# Patient Record
Sex: Male | Born: 1980 | Race: Black or African American | Hispanic: No | Marital: Single | Smoking: Current every day smoker
Health system: Southern US, Community
[De-identification: ages and names within clinical notes are randomized; demographics above are authoritative.]

## PROBLEM LIST (undated history)

## (undated) DIAGNOSIS — N183 Chronic kidney disease, stage 3 unspecified: Secondary | ICD-10-CM

## (undated) DIAGNOSIS — I10 Essential (primary) hypertension: Secondary | ICD-10-CM

## (undated) DIAGNOSIS — I5043 Acute on chronic combined systolic (congestive) and diastolic (congestive) heart failure: Secondary | ICD-10-CM

## (undated) DIAGNOSIS — I5021 Acute systolic (congestive) heart failure: Secondary | ICD-10-CM

## (undated) DIAGNOSIS — I499 Cardiac arrhythmia, unspecified: Secondary | ICD-10-CM

## (undated) DIAGNOSIS — F1910 Other psychoactive substance abuse, uncomplicated: Secondary | ICD-10-CM

## (undated) DIAGNOSIS — I5042 Chronic combined systolic (congestive) and diastolic (congestive) heart failure: Secondary | ICD-10-CM

## (undated) DIAGNOSIS — I4892 Unspecified atrial flutter: Secondary | ICD-10-CM

## (undated) DIAGNOSIS — I509 Heart failure, unspecified: Secondary | ICD-10-CM

## (undated) HISTORY — DX: Heart failure, unspecified: I50.9

## (undated) HISTORY — DX: Cardiac arrhythmia, unspecified: I49.9

## (undated) HISTORY — PX: KNEE SURGERY: SHX244

---

## 2011-07-05 ENCOUNTER — Emergency Department: Payer: Self-pay | Admitting: Emergency Medicine

## 2013-12-20 ENCOUNTER — Emergency Department (HOSPITAL_COMMUNITY): Payer: Self-pay

## 2013-12-20 ENCOUNTER — Emergency Department (HOSPITAL_COMMUNITY)
Admission: EM | Admit: 2013-12-20 | Discharge: 2013-12-20 | Disposition: A | Discharge status: Home / Self Care | Payer: Self-pay | Attending: Emergency Medicine | Admitting: Emergency Medicine

## 2013-12-20 ENCOUNTER — Encounter (HOSPITAL_COMMUNITY): Payer: Self-pay | Admitting: Emergency Medicine

## 2013-12-20 DIAGNOSIS — Z79899 Other long term (current) drug therapy: Secondary | ICD-10-CM | POA: Insufficient documentation

## 2013-12-20 DIAGNOSIS — I10 Essential (primary) hypertension: Secondary | ICD-10-CM | POA: Insufficient documentation

## 2013-12-20 DIAGNOSIS — F17200 Nicotine dependence, unspecified, uncomplicated: Secondary | ICD-10-CM | POA: Insufficient documentation

## 2013-12-20 DIAGNOSIS — R0602 Shortness of breath: Secondary | ICD-10-CM | POA: Insufficient documentation

## 2013-12-20 DIAGNOSIS — R079 Chest pain, unspecified: Secondary | ICD-10-CM

## 2013-12-20 HISTORY — DX: Essential (primary) hypertension: I10

## 2013-12-20 LAB — CBC
HCT: 37.8 % — ABNORMAL LOW (ref 39.0–52.0)
HEMOGLOBIN: 12.3 g/dL — AB (ref 13.0–17.0)
MCH: 22.5 pg — ABNORMAL LOW (ref 26.0–34.0)
MCHC: 32.5 g/dL (ref 30.0–36.0)
MCV: 69.2 fL — ABNORMAL LOW (ref 78.0–100.0)
Platelets: 293 10*3/uL (ref 150–400)
RBC: 5.46 MIL/uL (ref 4.22–5.81)
RDW: 15.9 % — ABNORMAL HIGH (ref 11.5–15.5)
WBC: 7.9 10*3/uL (ref 4.0–10.5)

## 2013-12-20 LAB — BASIC METABOLIC PANEL
BUN: 13 mg/dL (ref 6–23)
CO2: 24 meq/L (ref 19–32)
Calcium: 9.5 mg/dL (ref 8.4–10.5)
Chloride: 103 mEq/L (ref 96–112)
Creatinine, Ser: 0.95 mg/dL (ref 0.50–1.35)
GFR calc Af Amer: >90 mL/min (ref >90)
Glucose, Bld: 93 mg/dL (ref 70–99)
Potassium: 3.5 mEq/L — ABNORMAL LOW (ref 3.7–5.3)
SODIUM: 142 meq/L (ref 137–147)

## 2013-12-20 LAB — I-STAT TROPONIN, ED: Troponin i, poc: 0 ng/mL (ref 0.00–0.08)

## 2013-12-20 LAB — TROPONIN I

## 2013-12-20 MED ORDER — ASPIRIN 81 MG PO CHEW: 81.0000 mg | CHEWABLE_TABLET | Freq: Every day | ORAL | Status: DC

## 2013-12-20 MED ORDER — LISINOPRIL 20 MG PO TABS
20.0000 mg | ORAL_TABLET | Freq: Once | ORAL | Status: AC
  Administered 2013-12-20: 20 mg via ORAL
  Filled 2013-12-20: qty 1

## 2013-12-20 MED ORDER — ASPIRIN 81 MG PO CHEW
324.0000 mg | CHEWABLE_TABLET | Freq: Once | ORAL | Status: AC
  Administered 2013-12-20: 324 mg via ORAL
  Filled 2013-12-20: qty 4

## 2013-12-20 MED ORDER — GI COCKTAIL ~~LOC~~
30.0000 mL | Freq: Once | ORAL | Status: AC
  Administered 2013-12-20: 30 mL via ORAL
  Filled 2013-12-20: qty 30

## 2013-12-20 MED ORDER — LISINOPRIL 20 MG PO TABS: 20.0000 mg | ORAL_TABLET | Freq: Every day | ORAL | Status: DC

## 2013-12-20 MED ORDER — NITROGLYCERIN 0.4 MG SL SUBL
0.4000 mg | SUBLINGUAL_TABLET | SUBLINGUAL | Status: AC | PRN
Start: 2013-12-20 — End: 2013-12-20
  Administered 2013-12-20 (×3): 0.4 mg via SUBLINGUAL
  Filled 2013-12-20: qty 1

## 2013-12-20 MED ORDER — POTASSIUM CHLORIDE CRYS ER 20 MEQ PO TBCR
40.0000 meq | EXTENDED_RELEASE_TABLET | Freq: Once | ORAL | Status: AC
  Administered 2013-12-20: 40 meq via ORAL
  Filled 2013-12-20: qty 2

## 2013-12-20 NOTE — ED Notes (Signed)
PA at the bedside speaking with patient.

## 2013-12-20 NOTE — Discharge Instructions (Signed)
You were seen and evaluated for your symptoms of chest pain. Your lab testing, chest x-ray and EKG of your heart did not show any signs of a heart attack at this time. You were found to have elevated blood pressure and it is recommended that you continue your normal blood pressure medications as well as a baby aspirin daily. Please followup tomorrow with a cardiology specialist for continued evaluation and treatment. Return anytime for changing or worsening symptoms.    Chest Pain (Nonspecific) It is often hard to give a specific diagnosis for the cause of chest pain. There is always a chance that your pain could be related to something serious, such as a heart attack or a blood clot in the lungs. You need to follow up with your caregiver for further evaluation. CAUSES   Heartburn.  Pneumonia or bronchitis.  Anxiety or stress.  Inflammation around your heart (pericarditis) or lung (pleuritis or pleurisy).  A blood clot in the lung.  A collapsed lung (pneumothorax). It can develop suddenly on its own (spontaneous pneumothorax) or from injury (trauma) to the chest.  Shingles infection (herpes zoster virus). The chest wall is composed of bones, muscles, and cartilage. Any of these can be the source of the pain.  The bones can be bruised by injury.  The muscles or cartilage can be strained by coughing or overwork.  The cartilage can be affected by inflammation and become sore (costochondritis). DIAGNOSIS  Lab tests or other studies, such as X-rays, electrocardiography, stress testing, or cardiac imaging, may be needed to find the cause of your pain.  TREATMENT   Treatment depends on what may be causing your chest pain. Treatment may include:  Acid blockers for heartburn.  Anti-inflammatory medicine.  Pain medicine for inflammatory conditions.  Antibiotics if an infection is present.  You may be advised to change lifestyle habits. This includes stopping smoking and avoiding  alcohol, caffeine, and chocolate.  You may be advised to keep your head raised (elevated) when sleeping. This reduces the chance of acid going backward from your stomach into your esophagus.  Most of the time, nonspecific chest pain will improve within 2 to 3 days with rest and mild pain medicine. HOME CARE INSTRUCTIONS   If antibiotics were prescribed, take your antibiotics as directed. Finish them even if you start to feel better.  For the next few days, avoid physical activities that bring on chest pain. Continue physical activities as directed.  Do not smoke.  Avoid drinking alcohol.  Only take over-the-counter or prescription medicine for pain, discomfort, or fever as directed by your caregiver.  Follow your caregiver's suggestions for further testing if your chest pain does not go away.  Keep any follow-up appointments you made. If you do not go to an appointment, you could develop lasting (chronic) problems with pain. If there is any problem keeping an appointment, you must call to reschedule. SEEK MEDICAL CARE IF:   You think you are having problems from the medicine you are taking. Read your medicine instructions carefully.  Your chest pain does not go away, even after treatment.  You develop a rash with blisters on your chest. SEEK IMMEDIATE MEDICAL CARE IF:   You have increased chest pain or pain that spreads to your arm, neck, jaw, back, or abdomen.  You develop shortness of breath, an increasing cough, or you are coughing up blood.  You have severe back or abdominal pain, feel nauseous, or vomit.  You develop severe weakness, fainting, or chills.  You have a fever. THIS IS AN EMERGENCY. Do not wait to see if the pain will go away. Get medical help at once. Call your local emergency services (911 in [place] Do not drive yourself to the hospital. MAKE SURE YOU:   Understand these instructions.  Will watch your condition.  Will get help right away if you are not  doing well or get worse. Document Released: 05/14/2005 Document Revised: 10/27/2011 Document Reviewed: 03/09/2008 [facility] Patient Information 2014 [place].   Hypertension As your heart beats, it forces blood through your arteries. This force is your blood pressure. If the pressure is too high, it is called hypertension (HTN) or high blood pressure. HTN is dangerous because you may have it and not know it. High blood pressure may mean that your heart has to work harder to pump blood. Your arteries may be narrow or stiff. The extra work puts you at risk for heart disease, stroke, and other problems.  Blood pressure consists of two numbers, a higher number over a lower, 110/72, for example. It is stated as "110 over 72." The ideal is below 120 for the top number (systolic) and under 80 for the bottom (diastolic). Write down your blood pressure today. You should pay close attention to your blood pressure if you have certain conditions such as:  Heart failure.  Prior heart attack.  Diabetes  Chronic kidney disease.  Prior stroke.  Multiple risk factors for heart disease. To see if you have HTN, your blood pressure should be measured while you are seated with your arm held at the level of the heart. It should be measured at least twice. A one-time elevated blood pressure reading (especially in the Emergency Department) does not mean that you need treatment. There may be conditions in which the blood pressure is different between your right and left arms. It is important to see your caregiver soon for a recheck. Most people have essential hypertension which means that there is not a specific cause. This type of high blood pressure may be lowered by changing lifestyle factors such as:  Stress.  Smoking.  Lack of exercise.  Excessive weight.  Drug/tobacco/alcohol use.  Eating less salt. Most people do not have symptoms from high blood pressure until it has caused damage to the  body. Effective treatment can often prevent, delay or reduce that damage. TREATMENT  When a cause has been identified, treatment for high blood pressure is directed at the cause. There are a large number of medications to treat HTN. These fall into several categories, and your caregiver will help you select the medicines that are best for you. Medications may have side effects. You should review side effects with your caregiver. If your blood pressure stays high after you have made lifestyle changes or started on medicines,   Your medication(s) may need to be changed.  Other problems may need to be addressed.  Be certain you understand your prescriptions, and know how and when to take your medicine.  Be sure to follow up with your caregiver within the time frame advised (usually within two weeks) to have your blood pressure rechecked and to review your medications.  If you are taking more than one medicine to lower your blood pressure, make sure you know how and at what times they should be taken. Taking two medicines at the same time can result in blood pressure that is too low. SEEK IMMEDIATE MEDICAL CARE IF:  You develop a severe headache, blurred or changing  vision, or confusion.  You have unusual weakness or numbness, or a faint feeling.  You have severe chest or abdominal pain, vomiting, or breathing problems. MAKE SURE YOU:   Understand these instructions.  Will watch your condition.  Will get help right away if you are not doing well or get worse. Document Released: 08/04/2005 Document Revised: 10/27/2011 Document Reviewed: 03/24/2008 [facility] Patient Information 2014 [place].

## 2013-12-20 NOTE — ED Notes (Signed)
Pt reports intermittent sharp chest pain to center of chest x 3 days. Hx of HTN. States shortness of breath at times.

## 2013-12-20 NOTE — ED Provider Notes (Signed)
CSN: [id number]     Arrival date & time 12/20/13  1345 History   First MD Initiated Contact with Patient 12/20/13 2006     Chief Complaint  Patient presents with  . Chest Pain   HPI  History provided by the patient. Patient is a 33 year old male with history of hypertension who presents with complaints of central chest pain. Pain has been sharp for the past 2-3 days. Pain seems to be constant but slightly worse with certain movements and positioning. He states it is worse when he starts to sit forward or lying flat or twisting to his sides. There is some associated shortness of breath and occasionally feels like he is gasping for air or cannot take a deep full breath. Pain also seems to be occasionally worse after eating a meal. There has been no associated nausea, vomiting or diaphoresis. Patient does state that he was doing some increased activity mowing a large backyard the day before symptoms began. He denies feeling any soreness over the muscles of the chest when he pushes. He did take BC powder last night around midnight which did seem to help some. Patient also mentions that he's been without his lisinopril for his blood pressure for the past 3 weeks after being released from jail. Denies any headache or lightheadedness.    Past Medical History  Diagnosis Date  . Hypertension    History reviewed. No pertinent past surgical history. No family history on file. History  Substance Use Topics  . Smoking status: Current Every Day Smoker  . Smokeless tobacco: Not on file  . Alcohol Use: Yes    Review of Systems  Constitutional: Negative for fever, chills and diaphoresis.  Respiratory: Positive for shortness of breath. Negative for cough.   Cardiovascular: Positive for chest pain. Negative for palpitations and leg swelling.  Gastrointestinal: Negative for nausea, vomiting and abdominal pain.  All other systems reviewed and are negative.     Allergies  Review of patient's allergies  indicates no known allergies.  Home Medications   Prior to Admission medications   Not on File   BP 180/103  Pulse 82  Temp(Src) 98.4 F (36.9 C) (Oral)  Resp 16  Ht 6' (1.829 m)  Wt 287 lb (130.182 kg)  BMI 38.92 kg/m2  SpO2 99% Physical Exam  Nursing note and vitals reviewed. Constitutional: He appears well-developed and well-nourished. No distress.  HENT:  Head: Normocephalic and atraumatic.  Mouth/Throat: Oropharynx is clear and moist.  Cardiovascular: Normal rate and regular rhythm.   No murmur heard. Pulmonary/Chest: Effort normal and breath sounds normal. No respiratory distress. He has no wheezes. He has no rales. He exhibits no tenderness.  Abdominal: Soft. There is no tenderness. There is no rebound and no guarding.  Musculoskeletal: Normal range of motion. He exhibits no edema and no tenderness.  Old surgical scar of her right knee. No swelling or clinical signs for DVT and extremities.  Skin: Skin is warm. No rash noted.  Psychiatric: He has a normal mood and affect. His behavior is normal.    ED Course  Procedures   COORDINATION OF CARE:  Nursing notes reviewed. Vital signs reviewed. Initial pt interview and examination performed.   Filed Vitals:   12/20/13 1351 12/20/13 1653 12/20/13 1940  BP: 196/117 186/119 180/103  Pulse: 93 82 82  Temp: 98.2 F (36.8 C) 98.1 F (36.7 C) 98.4 F (36.9 C)  TempSrc: Oral Oral Oral  Resp: 18 22 16   Height: 6' (1.829 m)  Weight: 287 lb (130.182 kg)    SpO2: 98% 98% 99%    8:07 PM-patient seen and evaluated. Patient continues to have some discomfort worsened with movements and sitting forward. Initial lab testing, x-ray and EKG without specific acute findings. EKG does have some abnormal findings. Patient also with elevated blood pressure off of his medication for the past 3 weeks.  Patient with HEART score of 3-4. (Hx slight to moderate, Non specific abnormal ECG, <45 yo, >3 risk).  Patient discussed with  attending physician labs and EKG reviewed. Patient with slight to moderate history of pain. Pain present for the past several days and constant. We'll plan to get a second troponin. If negative we'll plan to give instructions for close outpatient followup with cardiologist referral.     Treatment plan initiated: Medications  lisinopril (PRINIVIL,ZESTRIL) tablet 20 mg (not administered)  aspirin chewable tablet 324 mg (not administered)    Results for orders placed during the hospital encounter of 12/20/13  CBC      Result Value Ref Range   WBC 7.9  4.0 - 10.5 K/uL   RBC 5.46  4.22 - 5.81 MIL/uL   Hemoglobin 12.3 (*) 13.0 - 17.0 g/dL   HCT 37.8 (*) 39.0 - 52.0 %   MCV 69.2 (*) 78.0 - 100.0 fL   MCH 22.5 (*) 26.0 - 34.0 pg   MCHC 32.5  30.0 - 36.0 g/dL   RDW 15.9 (*) 11.5 - 15.5 %   Platelets 293  150 - 400 K/uL  BASIC METABOLIC PANEL      Result Value Ref Range   Sodium 142  137 - 147 mEq/L   Potassium 3.5 (*) 3.7 - 5.3 mEq/L   Chloride 103  96 - 112 mEq/L   CO2 24  19 - 32 mEq/L   Glucose, Bld 93  70 - 99 mg/dL   BUN 13  6 - 23 mg/dL   Creatinine, Ser 0.95  0.50 - 1.35 mg/dL   Calcium 9.5  8.4 - 10.5 mg/dL   GFR calc non Af Amer >90  >90 mL/min   GFR calc Af Amer >90  >90 mL/min  TROPONIN I      Result Value Ref Range   Troponin I <0.30  <0.30 ng/mL  I-STAT TROPOININ, ED      Result Value Ref Range   Troponin i, poc 0.00  0.00 - 0.08 ng/mL   Comment 3                Imaging Review Dg Chest 2 View  12/20/2013   CLINICAL DATA:  Chest pain, shortness of Breath  EXAM: CHEST  2 VIEW  COMPARISON:  None.  FINDINGS: Cardiomediastinal silhouette is unremarkable. No acute infiltrate or pleural effusion. No pulmonary edema. Bony thorax is unremarkable.  IMPRESSION: No active cardiopulmonary disease.   Electronically Signed   By: Lahoma Crocker M.D.   On: 12/20/2013 14:36     EKG Interpretation   Date/Time:  Tuesday Dec 20 2013 13:50:44 EDT Ventricular Rate:  91 PR Interval:   190 QRS Duration: 90 QT Interval:  382 QTC Calculation: 469 R Axis:   12 Text Interpretation:  Normal sinus rhythm Possible Left atrial enlargement  Possible Anterior infarct , age undetermined ST \\T \ T wave abnormality,  consider inferolateral ischemia Abnormal ECG No prior EKG Confirmed by  Mingo Amber  MD, [place] ([id number]) on 12/20/2013 7:19:13 PM      MDM   Final diagnoses:  Chest pain  Hypertension  Martie Lee, PA-C 12/21/13 [phone]

## 2013-12-20 NOTE — ED Notes (Signed)
MD at the bedside  

## 2013-12-21 NOTE — ED Provider Notes (Signed)
Medical screening examination/treatment/procedure(s) were performed by non-physician practitioner and as supervising physician I was immediately available for consultation/collaboration.   EKG Interpretation   Date/Time:  Tuesday Dec 20 2013 13:50:44 EDT Ventricular Rate:  91 PR Interval:  190 QRS Duration: 90 QT Interval:  382 QTC Calculation: 469 R Axis:   12 Text Interpretation:  Normal sinus rhythm Possible Left atrial enlargement  Possible Anterior infarct , age undetermined ST \\T \ T wave abnormality,  consider inferolateral ischemia Abnormal ECG No prior EKG Confirmed by  Mingo Amber  MD, [place] ([id number]) on 12/20/2013 7:19:13 PM       Merryl Hacker, MD 12/21/13 [phone]

## 2015-12-21 ENCOUNTER — Encounter: Payer: Self-pay | Admitting: Emergency Medicine

## 2015-12-21 ENCOUNTER — Inpatient Hospital Stay
Admit: 2015-12-21 | Discharge: 2015-12-21 | Disposition: A | Discharge status: Home / Self Care | Payer: Self-pay | Attending: Cardiovascular Disease | Admitting: Cardiovascular Disease

## 2015-12-21 ENCOUNTER — Inpatient Hospital Stay
Admission: EM | Admit: 2015-12-21 | Discharge: 2015-12-23 | DRG: 917 | Disposition: A | Discharge status: Home / Self Care | Payer: Self-pay | Attending: Internal Medicine | Admitting: Internal Medicine

## 2015-12-21 ENCOUNTER — Emergency Department: Payer: Self-pay

## 2015-12-21 DIAGNOSIS — F1423 Cocaine dependence with withdrawal: Secondary | ICD-10-CM | POA: Diagnosis present

## 2015-12-21 DIAGNOSIS — Z7982 Long term (current) use of aspirin: Secondary | ICD-10-CM

## 2015-12-21 DIAGNOSIS — E876 Hypokalemia: Secondary | ICD-10-CM | POA: Diagnosis present

## 2015-12-21 DIAGNOSIS — N179 Acute kidney failure, unspecified: Secondary | ICD-10-CM | POA: Diagnosis present

## 2015-12-21 DIAGNOSIS — Z23 Encounter for immunization: Secondary | ICD-10-CM

## 2015-12-21 DIAGNOSIS — J189 Pneumonia, unspecified organism: Secondary | ICD-10-CM | POA: Diagnosis present

## 2015-12-21 DIAGNOSIS — I10 Essential (primary) hypertension: Secondary | ICD-10-CM | POA: Diagnosis present

## 2015-12-21 DIAGNOSIS — I214 Non-ST elevation (NSTEMI) myocardial infarction: Secondary | ICD-10-CM

## 2015-12-21 DIAGNOSIS — T405X1A Poisoning by cocaine, accidental (unintentional), initial encounter: Principal | ICD-10-CM | POA: Diagnosis present

## 2015-12-21 DIAGNOSIS — F1410 Cocaine abuse, uncomplicated: Secondary | ICD-10-CM

## 2015-12-21 DIAGNOSIS — F17210 Nicotine dependence, cigarettes, uncomplicated: Secondary | ICD-10-CM | POA: Diagnosis present

## 2015-12-21 DIAGNOSIS — I160 Hypertensive urgency: Secondary | ICD-10-CM | POA: Diagnosis present

## 2015-12-21 HISTORY — DX: Non-ST elevation (NSTEMI) myocardial infarction: I21.4

## 2015-12-21 LAB — BASIC METABOLIC PANEL
ANION GAP: 12 (ref 5–15)
BUN: 13 mg/dL (ref 6–20)
CO2: 26 mmol/L (ref 22–32)
Calcium: 8.8 mg/dL — ABNORMAL LOW (ref 8.9–10.3)
Chloride: 105 mmol/L (ref 101–111)
Creatinine, Ser: 1.33 mg/dL — ABNORMAL HIGH (ref 0.61–1.24)
GFR calc Af Amer: >60 mL/min (ref >60)
Glucose, Bld: 116 mg/dL — ABNORMAL HIGH (ref 65–99)
POTASSIUM: 2.8 mmol/L — AB (ref 3.5–5.1)
SODIUM: 143 mmol/L (ref 135–145)

## 2015-12-21 LAB — HEPARIN LEVEL (UNFRACTIONATED)

## 2015-12-21 LAB — CBC
HEMATOCRIT: 39.7 % — AB (ref 40.0–52.0)
Hemoglobin: 12.4 g/dL — ABNORMAL LOW (ref 13.0–18.0)
MCH: 21.1 pg — ABNORMAL LOW (ref 26.0–34.0)
MCHC: 31.3 g/dL — ABNORMAL LOW (ref 32.0–36.0)
MCV: 67.3 fL — ABNORMAL LOW (ref 80.0–100.0)
Platelets: 288 10*3/uL (ref 150–440)
RBC: 5.9 MIL/uL (ref 4.40–5.90)
RDW: 18.1 % — AB (ref 11.5–14.5)
WBC: 10.4 10*3/uL (ref 3.8–10.6)

## 2015-12-21 LAB — TROPONIN I
TROPONIN I: 0.15 ng/mL — AB (ref <0.031)
TROPONIN I: 0.16 ng/mL — AB (ref <0.031)
TROPONIN I: 0.18 ng/mL — AB (ref <0.031)

## 2015-12-21 LAB — MAGNESIUM: Magnesium: 2.2 mg/dL (ref 1.7–2.4)

## 2015-12-21 MED ORDER — ATORVASTATIN CALCIUM 20 MG PO TABS
40.0000 mg | ORAL_TABLET | Freq: Every day | ORAL | Status: DC
  Administered 2015-12-22: 40 mg via ORAL
  Filled 2015-12-21: qty 2

## 2015-12-21 MED ORDER — HEPARIN BOLUS VIA INFUSION
3100.0000 [IU] | Freq: Once | INTRAVENOUS | Status: AC
  Administered 2015-12-21: 3100 [IU] via INTRAVENOUS
  Filled 2015-12-21: qty 3100

## 2015-12-21 MED ORDER — HEPARIN (PORCINE) IN NACL 100-0.45 UNIT/ML-% IJ SOLN
750.0000 [IU]/kg/h | Freq: Once | INTRAMUSCULAR | Status: DC
  Filled 2015-12-21: qty 250

## 2015-12-21 MED ORDER — HYDRALAZINE HCL 20 MG/ML IJ SOLN
20.0000 mg | Freq: Once | INTRAMUSCULAR | Status: AC
  Administered 2015-12-21: 20 mg via INTRAVENOUS

## 2015-12-21 MED ORDER — LEVOFLOXACIN IN D5W 750 MG/150ML IV SOLN
750.0000 mg | Freq: Once | INTRAVENOUS | Status: AC
  Administered 2015-12-21: 750 mg via INTRAVENOUS
  Filled 2015-12-21: qty 150

## 2015-12-21 MED ORDER — POTASSIUM CHLORIDE CRYS ER 20 MEQ PO TBCR
40.0000 meq | EXTENDED_RELEASE_TABLET | Freq: Once | ORAL | Status: AC
  Administered 2015-12-21: 40 meq via ORAL
  Filled 2015-12-21: qty 2

## 2015-12-21 MED ORDER — ONDANSETRON HCL 4 MG/2ML IJ SOLN: 4.0000 mg | Freq: Four times a day (QID) | INTRAMUSCULAR | Status: DC | PRN

## 2015-12-21 MED ORDER — ASPIRIN 81 MG PO CHEW: 324.0000 mg | CHEWABLE_TABLET | ORAL | Status: DC

## 2015-12-21 MED ORDER — POTASSIUM CHLORIDE CRYS ER 20 MEQ PO TBCR
40.0000 meq | EXTENDED_RELEASE_TABLET | Freq: Two times a day (BID) | ORAL | Status: AC
  Administered 2015-12-21 (×2): 40 meq via ORAL
  Filled 2015-12-21 (×2): qty 2

## 2015-12-21 MED ORDER — HYDRALAZINE HCL 25 MG PO TABS
25.0000 mg | ORAL_TABLET | Freq: Three times a day (TID) | ORAL | Status: DC
  Administered 2015-12-21 – 2015-12-22 (×3): 25 mg via ORAL
  Filled 2015-12-21 (×5): qty 1

## 2015-12-21 MED ORDER — OXYCODONE-ACETAMINOPHEN 5-325 MG PO TABS
1.0000 | ORAL_TABLET | Freq: Four times a day (QID) | ORAL | Status: DC | PRN
  Administered 2015-12-21 – 2015-12-22 (×2): 1 via ORAL
  Filled 2015-12-21 (×2): qty 1

## 2015-12-21 MED ORDER — SODIUM CHLORIDE 0.9 % IV SOLN: 10.0000 mL/h | INTRAVENOUS | Status: DC

## 2015-12-21 MED ORDER — LEVOFLOXACIN IN D5W 750 MG/150ML IV SOLN
750.0000 mg | INTRAVENOUS | Status: DC
  Administered 2015-12-22 – 2015-12-23 (×2): 750 mg via INTRAVENOUS
  Filled 2015-12-21 (×2): qty 150

## 2015-12-21 MED ORDER — HEPARIN SODIUM (PORCINE) 5000 UNIT/ML IJ SOLN
4000.0000 [IU] | INTRAMUSCULAR | Status: AC
  Administered 2015-12-21: 4000 [IU] via INTRAVENOUS
  Filled 2015-12-21: qty 1

## 2015-12-21 MED ORDER — NICOTINE 14 MG/24HR TD PT24
14.0000 mg | MEDICATED_PATCH | Freq: Every day | TRANSDERMAL | Status: DC
  Administered 2015-12-21: 14 mg via TRANSDERMAL
  Filled 2015-12-21 (×2): qty 1

## 2015-12-21 MED ORDER — NITROGLYCERIN 0.4 MG SL SUBL
0.4000 mg | SUBLINGUAL_TABLET | SUBLINGUAL | Status: DC | PRN
  Administered 2015-12-21 (×2): 0.4 mg via SUBLINGUAL
  Filled 2015-12-21: qty 2
  Filled 2015-12-21: qty 1

## 2015-12-21 MED ORDER — ASPIRIN 300 MG RE SUPP: 300.0000 mg | RECTAL | Status: DC

## 2015-12-21 MED ORDER — SODIUM CHLORIDE 0.9 % IV SOLN
INTRAVENOUS | Status: AC
  Administered 2015-12-21 – 2015-12-22 (×2): via INTRAVENOUS

## 2015-12-21 MED ORDER — ASPIRIN EC 325 MG PO TBEC
325.0000 mg | DELAYED_RELEASE_TABLET | Freq: Once | ORAL | Status: AC
  Administered 2015-12-21: 325 mg via ORAL
  Filled 2015-12-21: qty 1

## 2015-12-21 MED ORDER — ACETAMINOPHEN 325 MG PO TABS
650.0000 mg | ORAL_TABLET | ORAL | Status: DC | PRN
  Administered 2015-12-21 – 2015-12-23 (×5): 650 mg via ORAL
  Filled 2015-12-21 (×5): qty 2

## 2015-12-21 MED ORDER — HEPARIN (PORCINE) IN NACL 100-0.45 UNIT/ML-% IJ SOLN
2100.0000 [IU]/h | INTRAMUSCULAR | Status: DC
  Administered 2015-12-21: 750 [IU]/h via INTRAVENOUS
  Administered 2015-12-22: 1700 [IU]/h via INTRAVENOUS
  Filled 2015-12-21 (×4): qty 250

## 2015-12-21 MED ORDER — SODIUM CHLORIDE 0.9 % IV BOLUS (SEPSIS)
1000.0000 mL | Freq: Once | INTRAVENOUS | Status: AC
  Administered 2015-12-21: 1000 mL via INTRAVENOUS

## 2015-12-21 MED ORDER — PNEUMOCOCCAL VAC POLYVALENT 25 MCG/0.5ML IJ INJ
0.5000 mL | INJECTION | INTRAMUSCULAR | Status: AC
  Administered 2015-12-22: 0.5 mL via INTRAMUSCULAR
  Filled 2015-12-21: qty 0.5

## 2015-12-21 MED ORDER — HYDRALAZINE HCL 20 MG/ML IJ SOLN
10.0000 mg | INTRAMUSCULAR | Status: AC
  Administered 2015-12-21: 10 mg via INTRAVENOUS

## 2015-12-21 MED ORDER — HYDRALAZINE HCL 20 MG/ML IJ SOLN
10.0000 mg | INTRAMUSCULAR | Status: DC | PRN
  Administered 2015-12-21 – 2015-12-22 (×4): 10 mg via INTRAVENOUS
  Filled 2015-12-21 (×6): qty 1

## 2015-12-21 MED ORDER — LEVOFLOXACIN IN D5W 750 MG/150ML IV SOLN: 750.0000 mg | INTRAVENOUS | Status: DC

## 2015-12-21 NOTE — ED Provider Notes (Addendum)
[facility] Emergency Department Provider Note  ____________________________________________  Time seen: Approximately 11:50 AM  I have reviewed the triage vital signs and the nursing notes.   HISTORY  Chief Complaint Chest Pain; Cough; and Shortness of Breath    HPI Gerald [name] is a 35 y.o. male with a history of hypertension not taking his medications, cocaine abuse last night, presenting with cough, shortness of breath, chest pain. The patient reports that for the past few days he has had a nonproductive cough. Last night he was using cocaine and this morning woke up with a "sharp" left-sided chest pain that was associated with shortness of breath. The pain is worse when he "pushes up" or lifts his arms, but it also occurs at rest. He does not notice that his pain is worse with exertion. He reports that he has been having multiple episodes of chest pain for the last couple of weeks, similar in character to today. He denies any nausea or vomiting, diaphoresis, sore throat, ear pain, congestion or rhinorrhea.  [id number] cocaine. Positive tobacco abuse. Positive occasional alcohol, none recently.  FH: Uncle with CVA but no family history of early CAD.   Past Medical History  Diagnosis Date  . Hypertension     There are no active problems to display for this patient.   Past Surgical History  Procedure Laterality Date  . Knee surgery Right     Current Outpatient Rx  Name  Route  Sig  Dispense  Refill  . aspirin 81 MG chewable tablet   Oral   Chew 1 tablet (81 mg total) by mouth daily.   30 tablet   0   . lisinopril (PRINIVIL,ZESTRIL) 20 MG tablet   Oral   Take 1 tablet (20 mg total) by mouth daily.   30 tablet   0     Allergies Review of patient's allergies indicates no known allergies.  No family history on file.  Social History Social History  Substance Use Topics  . Smoking status: Current Every Day Smoker -- 1.00 packs/day     Types: Cigarettes  . Smokeless tobacco: None  . Alcohol Use: Yes     Comment: occas.     Review of Systems Constitutional: No fever/chills. No lightheadedness or syncope. No diaphoresis. Eyes: No visual changes. ENT: No sore throat. No congestion or rhinorrhea. Cardiovascular: Positive chest pain. Denies palpitations. Respiratory: Positive shortness of breath.  Positive cough. Gastrointestinal: No abdominal pain.  No nausea, no vomiting.  No diarrhea.  No constipation. Genitourinary: Negative for dysuria. Musculoskeletal: Negative for back pain. Skin: Negative for rash. Neurological: Negative for headaches. No focal numbness, tingling or weakness.   10-point ROS otherwise negative.  ____________________________________________   PHYSICAL EXAM:  VITAL SIGNS: ED Triage Vitals  Enc Vitals Group     BP 12/21/15 1019 193/111 mmHg     Pulse Rate 12/21/15 1019 110     Resp 12/21/15 1019 18     Temp 12/21/15 1019 98.1 F (36.7 C)     Temp Source 12/21/15 1019 Oral     SpO2 12/21/15 1019 96 %     Weight 12/21/15 1019 275 lb (124.739 kg)     Height 12/21/15 1019 5\' 11"  (1.803 m)     Head Cir --      Peak Flow --      Pain Score 12/21/15 1020 9     Pain Loc --      Pain Edu? --  Excl. in [place]? --     Constitutional: Alert and oriented. Well appearing and in no acute distress. Answers questions appropriately. Eyes: Conjunctivae are normal.  EOMI. No scleral icterus. Head: Atraumatic. Nose: No congestion/rhinnorhea. Mouth/Throat: Mucous membranes are moist.  Neck: No stridor.  Supple.  No JVD. Cardiovascular: Normal rate, regular rhythm. No murmurs, rubs or gallops.  Respiratory: Normal respiratory effort.  No accessory muscle use or retractions. Lungs CTAB.  No wheezes or rhonchi. Rales in the right base that are minimal.  Gastrointestinal: Obese. Soft, nontender and nondistended.  No guarding or rebound.  No peritoneal signs. Musculoskeletal: No LE edema. No ttp in  the calves or palpable cords.  Negative Homan's sign. Neurologic:  A&Ox3.  Speech is clear.  Face and smile are symmetric.  EOMI.  Moves all extremities well. Skin:  Skin is warm, dry and intact. No rash noted. Psychiatric: Mood and affect are normal. Speech and behavior are normal.  Normal judgement.  ____________________________________________   LABS (all labs ordered are listed, but only abnormal results are displayed)  Labs Reviewed  BASIC METABOLIC PANEL - Abnormal; Notable for the following:    Potassium 2.8 (*)    Glucose, Bld 116 (*)    Creatinine, Ser 1.33 (*)    Calcium 8.8 (*)    All other components within normal limits  CBC - Abnormal; Notable for the following:    Hemoglobin 12.4 (*)    HCT 39.7 (*)    MCV 67.3 (*)    MCH 21.1 (*)    MCHC 31.3 (*)    RDW 18.1 (*)    All other components within normal limits  TROPONIN I - Abnormal; Notable for the following:    Troponin I 0.15 (*)    All other components within normal limits  CULTURE, BLOOD (ROUTINE X 2)  CULTURE, BLOOD (ROUTINE X 2)   ____________________________________________  EKG  ED ECG REPORT I, Eula Listen, the attending physician, personally viewed and interpreted this ECG.   Date: 12/21/2015  EKG Time: 1021  Rate: 103  Rhythm: sinus tachycardia  Axis: Normal  Intervals:none  ST&T Change: Patient has 1.5 mm of ST elevation in V1, 1 mm of ST elevation in V2, nonspecific T-wave inversions in V4 V5 and V6. This EKG does not meet STEMI criteria but is concerning for ischemia.  ____________________________________________  RADIOLOGY  Dg Chest 2 View  12/21/2015  CLINICAL DATA:  Acute chest pain. EXAM: CHEST  2 VIEW COMPARISON:  Dec 20, 2013. FINDINGS: Stable cardiomediastinal silhouette. No pneumothorax or pleural effusion is noted. Bony thorax is intact. Left lung is clear. Ill-defined nodular opacities are noted in the right upper lobe which may represent inflammation. IMPRESSION:  Ill-defined nodular opacities seen in right upper lobe which may represent focal pneumonia. Follow-up PA and lateral views of the chest in 3-4 weeks after antibiotic therapy trial is recommended to ensure resolution. Electronically Signed   By: Marijo Conception, M.D.   On: 12/21/2015 10:51    ____________________________________________   PROCEDURES  Procedure(s) performed: None  Critical Care performed: Yes ____________________________________________   INITIAL IMPRESSION / ASSESSMENT AND PLAN / ED COURSE  Pertinent labs & imaging results that were available during my care of the patient were reviewed by me and considered in my medical decision making (see chart for details).  35 y.o. male presenting with chest pain and cough in the setting of cocaine use. Clinically, I'm concerned that the patient has a pneumonia and I will culture him and give  him immediate empiric antibiotics. His EKG is also concerning for some ischemic changes and although he does not meet STEMI criteria, I will contact cardiology immediately for further guidance. The patient will receive nitroglycerin for his chest pain, and aspirin at this time, and we will discuss anticoagulation with cardiology. Plan admission to the hospital.  ----------------------------------------- 12:23 PM on 12/21/2015 -----------------------------------------  The patient does have a positive troponin. He is being treated with aspirin and heparin. I have spoken with Dr. Chancy Milroy, the cardiologist on-call, who agrees at this does not meet STEMI criteria and cardiac catheterization is not warranted at this time, but would proceed with admission for anticoagulation and continued monitoring. The patient has also received antibiotics for his pneumonia. I'll plan to admit the patient at this time.  CRITICAL CARE Performed by: Eula Listen   Total critical care time: 35 minutes  Critical care time was exclusive of separately billable  procedures and treating other patients.  Critical care was necessary to treat or prevent imminent or life-threatening deterioration.  Critical care was time spent personally by me on the following activities: development of treatment plan with patient and/or surrogate as well as nursing, discussions with consultants, evaluation of patient's response to treatment, examination of patient, obtaining history from patient or surrogate, ordering and performing treatments and interventions, ordering and review of laboratory studies, ordering and review of radiographic studies, pulse oximetry and re-evaluation of patient's condition.  ____________________________________________  FINAL CLINICAL IMPRESSION(S) / ED DIAGNOSES  Final diagnoses:  NSTEMI (non-ST elevated myocardial infarction) ([place])  Cocaine abuse  Hypokalemia  CAP (community acquired pneumonia)      NEW MEDICATIONS STARTED DURING THIS VISIT:  New Prescriptions   No medications on file     Eula Listen, MD 12/21/15 1223  Eula Listen, MD 12/21/15 1224

## 2015-12-21 NOTE — ED Notes (Signed)
MD at bedside. 

## 2015-12-21 NOTE — Progress Notes (Addendum)
ANTICOAGULATION CONSULT NOTE - Initial Consult  Pharmacy Consult for Heparin Drip Indication: chest pain/ACS  No Known Allergies  Patient Measurements: Height: 5\' 11"  (180.3 cm) (stated) Weight: (!) 313 lb (141.976 kg) (admission) IBW/kg (Calculated) : 75.3 Heparin Dosing Weight: 103.3 kg  Vital Signs: Temp: 98.3 F (36.8 C) (05/05 2012) Temp Source: Oral (05/05 2012) BP: 179/107 mmHg (05/05 2012) Pulse Rate: 106 (05/05 2012)  Labs:  Recent Labs  12/21/15 1023 12/21/15 1348 12/21/15 1913  HGB 12.4*  --   --   HCT 39.7*  --   --   PLT 288  --   --   HEPARINUNFRC  --   --  <0.10*  CREATININE 1.33*  --   --   TROPONINI 0.15* 0.16* 0.18*    Estimated Creatinine Clearance: 111.8 mL/min (by C-G formula based on Cr of 1.33).   Medical History: Past Medical History  Diagnosis Date  . Hypertension     Medications:  Scheduled:  . [START ON 12/22/2015] aspirin  324 mg Oral NOW   Or  . [START ON 12/22/2015] aspirin  300 mg Rectal NOW  . atorvastatin  40 mg Oral q1800  . heparin  3,100 Units Intravenous Once  . hydrALAZINE  25 mg Oral Q8H  . [START ON 12/22/2015] levofloxacin (LEVAQUIN) IV  750 mg Intravenous Q24H  . nicotine  14 mg Transdermal Daily  . [START ON 12/22/2015] pneumococcal 23 valent vaccine  0.5 mL Intramuscular Tomorrow-1000   Infusions:  . sodium chloride 10 mL/hr (12/21/15 1554)  . sodium chloride 75 mL/hr at 12/21/15 1553  . heparin 1,300 Units/hr (12/21/15 1350)    Assessment: Pharmacy consulted to dose Heparin in this 35 y.o. male presenting with chest pain and cough in the setting of cocaine use.  Goal of Therapy:  Heparin level 0.3-0.7 units/ml   Plan:  Give 4000 units bolus x 1 Start heparin infusion at 1300 units/hr Check anti-Xa level in 6 hours and daily while on heparin Continue to monitor H&H and platelets  HL ordered for 5/5 at 19:30.    5/5:  HL @ 19:30 = < 0.1.   Will order Heparin 3100 units IV X 1 bolus and incease gtt rate to  1700 units/hr.         Will recheck HL 6 hrs after rate change on 5/6 @ 3:00.   [place] Pharmacist 12/21/2015,8:39 PM

## 2015-12-21 NOTE — Progress Notes (Signed)
Pharmacy Antibiotic Note  Gerald [name] is a 35 y.o. male admitted on 12/21/2015 with pneumonia.  Pharmacy has been consulted for levofloxacin dosing/monitoring renal doses for antibiotics.  Plan: Levofloxacin 750 mg IV q24h  Height: 5\' 11"  (180.3 cm) (stated) Weight: (!) 313 lb (141.976 kg) (admission) IBW/kg (Calculated) : 75.3  Temp (24hrs), Avg:98.2 F (36.8 C), Min:98.1 F (36.7 C), Max:98.3 F (36.8 C)   Recent Labs Lab 12/21/15 1023  WBC 10.4  CREATININE 1.33*    Estimated Creatinine Clearance: 111.8 mL/min (by C-G formula based on Cr of 1.33).    No Known Allergies  Antimicrobials this admission: levofloxacin 5/5 >>   Dose adjustments this admission:  Microbiology results: 5/5 BCx: Sent 5/5 Sputum: Sent   Thank you for allowing pharmacy to be a part of this patient's care.  Lenis Noon, PharmD Clinical Pharmacist 12/21/2015 3:06 PM

## 2015-12-21 NOTE — ED Notes (Signed)
Pt states cough, shob, and pain in his chest with cough for the past few days now, states he feels his heart skips a beat at night and it wakes him up. Pt appears in no distress.

## 2015-12-21 NOTE — ED Notes (Signed)
Pharmacy called X 2 for hydralazine PO.

## 2015-12-21 NOTE — ED Notes (Signed)
No answer for triage.

## 2015-12-21 NOTE — ED Notes (Signed)
Dr. Bridgett Larsson updated on patient recent VS. Verbal orders received for 10mg  hydralazine IV. He also states to give patient 20mg  PO hydralazine. Pharmacy called for PO medication.

## 2015-12-21 NOTE — ED Notes (Signed)
Patient to ER for c/o shortness of breath, chest discomfort, and feeling like heart is racing.

## 2015-12-21 NOTE — ED Notes (Signed)
Pt up to bathroom.

## 2015-12-21 NOTE — ED Notes (Signed)
No answer at 2nd call for triage.

## 2015-12-21 NOTE — H&P (Signed)
[facility] [place] NAME: Gerald [name]    MR#:  [id number]  DATE OF BIRTH:  1981/03/15  DATE OF ADMISSION:  12/21/2015  PRIMARY CARE PHYSICIAN: [name] Patient   REQUESTING/REFERRING PHYSICIAN: Eula Listen, MD  CHIEF COMPLAINT:   Chief Complaint  Patient presents with  . Chest Pain  . Cough  . Shortness of Breath   Chest pain and shortness of breath for 2 days. HISTORY OF PRESENT ILLNESS:  Gerald [name]  is a 35 y.o. male with a known history of hypertension. Patient presently ED with chest pain, cough and shortness of breath today. The patient has had a nonproductive cough and shortness of breath for the past few days. He used cocaine last night and started to have chest pain this morning, which is in substernal area, intermittent, sharp, 6-8 out of 10 without radiation. He denies any palpitation, orthopnea, nocturnal dyspnea or leg edema. His troponin level is elevated, EKG showed T-wave inversion. ED physician discussed with the on-call cardiologist, Dr. Humphrey Rolls, who suggested start heparin drip for non-STEMI.  PAST MEDICAL HISTORY:   Past Medical History  Diagnosis Date  . Hypertension     PAST SURGICAL HISTORY:   Past Surgical History  Procedure Laterality Date  . Knee surgery Right     SOCIAL HISTORY:   Social History  Substance Use Topics  . Smoking status: Current Every Day Smoker -- 1.00 packs/day    Types: Cigarettes  . Smokeless tobacco: Not on file  . Alcohol Use: Yes     Comment: occas.     FAMILY HISTORY:   Family History  Problem Relation Age of Onset  . Hypertension Father     DRUG ALLERGIES:  No Known Allergies  REVIEW OF SYSTEMS:  CONSTITUTIONAL: No fever, fatigue or weakness.  EYES: No blurred or double vision.  EARS, NOSE, AND THROAT: No tinnitus or ear pain.  RESPIRATORY: Has cough, shortness of breath, no wheezing or hemoptysis.  CARDIOVASCULAR: has chest pain, no orthopnea,  edema.  GASTROINTESTINAL: No nausea, vomiting, diarrhea or abdominal pain.  GENITOURINARY: No dysuria, hematuria.  ENDOCRINE: No polyuria, nocturia,  HEMATOLOGY: No anemia, easy bruising or bleeding SKIN: No rash or lesion. MUSCULOSKELETAL: No joint pain or arthritis.   NEUROLOGIC: No tingling, numbness, weakness.  PSYCHIATRY: No anxiety or depression.   MEDICATIONS AT HOME:   Prior to Admission medications   Not on File      VITAL SIGNS:  Blood pressure 194/126, pulse 103, temperature 98.1 F (36.7 C), temperature source Oral, resp. rate 35, height 5\' 11"  (1.803 m), weight 124.739 kg (275 lb), SpO2 96 %.  PHYSICAL EXAMINATION:  GENERAL:  35 y.o.-year-old patient lying in the bed with no acute distress. Morbidly obese. EYES: Pupils equal, round, reactive to light and accommodation. No scleral icterus. Extraocular muscles intact.  HEENT: Head atraumatic, normocephalic. Oropharynx and nasopharynx clear.  NECK:  Supple, no jugular venous distention. No thyroid enlargement, no tenderness.  LUNGS: Normal breath sounds bilaterally, no wheezing, rales,rhonchi or crepitation. No use of accessory muscles of respiration.  CARDIOVASCULAR: S1, S2 normal. No murmurs, rubs, or gallops.  ABDOMEN: Soft, nontender, nondistended. Bowel sounds present. No organomegaly or mass.  EXTREMITIES: No pedal edema, cyanosis, or clubbing.  NEUROLOGIC: Cranial nerves II through XII are intact. Muscle strength 5/5 in all extremities. Sensation intact. Gait not checked.  PSYCHIATRIC: The patient is alert and oriented x 3.  SKIN: No obvious rash, lesion, or ulcer.  LABORATORY PANEL:   CBC  Recent Labs Lab 12/21/15 1023  WBC 10.4  HGB 12.4*  HCT 39.7*  PLT 288   ------------------------------------------------------------------------------------------------------------------  Chemistries   Recent Labs Lab 12/21/15 1023  NA 143  K 2.8*  CL 105  CO2 26  GLUCOSE 116*  BUN 13  CREATININE 1.33*   CALCIUM 8.8*   ------------------------------------------------------------------------------------------------------------------  Cardiac Enzymes  Recent Labs Lab 12/21/15 1023  TROPONINI 0.15*   ------------------------------------------------------------------------------------------------------------------  RADIOLOGY:  Dg Chest 2 View  12/21/2015  CLINICAL DATA:  Acute chest pain. EXAM: CHEST  2 VIEW COMPARISON:  Dec 20, 2013. FINDINGS: Stable cardiomediastinal silhouette. No pneumothorax or pleural effusion is noted. Bony thorax is intact. Left lung is clear. Ill-defined nodular opacities are noted in the right upper lobe which may represent inflammation. IMPRESSION: Ill-defined nodular opacities seen in right upper lobe which may represent focal pneumonia. Follow-up PA and lateral views of the chest in 3-4 weeks after antibiotic therapy trial is recommended to ensure resolution. Electronically Signed   By: Marijo Conception, M.D.   On: 12/21/2015 10:51    EKG:   Orders placed or performed during the hospital encounter of 12/21/15  . EKG 12-Lead  . EKG 12-Lead  . ED EKG within 10 minutes  . ED EKG within 10 minutes  . ED EKG  . ED EKG    IMPRESSION AND PLAN:   Non-STEMI Start heparin drip per Dr. Humphrey Rolls, give aspirin and Lipitor, follow-up troponin level cardiology consult. Nitroglycerin when necessary. Check lipid panel.  Cocaine abuse. Drug abuse sensation was counseled.  Pneumonia (CAP) with leukocytosis. Continue Levaquin, follow-up CBC, blood culture and sputum culture.  Hypertension urgency. Start hydralazine IV when necessary. Start hydralazine by mouth. No beta blocker due to cocaine abuse.  Hypokalemia. Give potassium supplement and follow-up potassium level and magnesium level.  Acute kidney injury. Start IV fluid support and follow-up BMP.  Tobacco abuse. Smoking cessation was counseled for 3-4 minutes, given nicotine patch.  All the records are reviewed  and case discussed with ED provider. Management plans discussed with the patient, family and they are in agreement.  CODE STATUS: Full code  TOTAL TIME TAKING CARE OF THIS PATIENT: 58 minutes.    Demetrios Loll M.D on 12/21/2015 at 1:15 PM  Between 7am to 6pm - Pager - [phone]  After 6pm go to [email or web address] - password [email or web address] [facility]  [place] [facility]  Office  [phone]  CC: Primary care physician; [name] Patient

## 2015-12-21 NOTE — ED Notes (Signed)
Heparin verified with Shirlee Limerick, RN

## 2015-12-21 NOTE — Progress Notes (Signed)
Gerald [name] is a 35 y.o. male  [id number]  Primary Cardiologist: Neoma Laming Reason for Consultation: Chest pain  HPI: This is a 35 year old African-American male with a past medical history of hypertension presented to the hospital after using cocaine last night with chest pain. Chest pain has resolved. Patient apparently had abnormal EKG with questionable ST elevation and T-wave inversion thus I was asked to evaluate the patient right away. Patient was given nitroglycerin and Nitropaste and chest pain has resolved.   Review of Systems: No orthopnea PND or leg swelling   Past Medical History  Diagnosis Date  . Hypertension      (Not in a hospital admission)   . hydrALAZINE  25 mg Oral Q8H    Infusions: . sodium chloride    . heparin 750 Units/hr (12/21/15 1303)    No Known Allergies  Social History   Social History  . Marital Status: Single    Spouse Name: N/A  . Number of Children: N/A  . Years of Education: N/A   Occupational History  . Not on file.   Social History Main Topics  . Smoking status: Current Every Day Smoker -- 1.00 packs/day    Types: Cigarettes  . Smokeless tobacco: Not on file  . Alcohol Use: Yes     Comment: occas.   . Drug Use: Yes    Special: Cocaine  . Sexual Activity: Not on file   Other Topics Concern  . Not on file   Social History Narrative    Family History  Problem Relation Age of Onset  . Hypertension Father     PHYSICAL EXAM: Filed Vitals:   12/21/15 1301 12/21/15 1346  BP:  165/127  Pulse:    Temp:    Resp: 35     No intake or output data in the 24 hours ending 12/21/15 1349  General:  Well appearing. No respiratory difficulty HEENT: normal Neck: supple. no JVD. Carotids 2+ bilat; no bruits. No lymphadenopathy or thryomegaly appreciated. Cor: PMI nondisplaced. Regular rate & rhythm. No rubs, gallops or murmurs. Lungs: clear Abdomen: soft, nontender, nondistended. No hepatosplenomegaly. No bruits  or masses. Good bowel sounds. Extremities: no cyanosis, clubbing, rash, edema Neuro: alert & oriented x 3, cranial nerves grossly intact. moves all 4 extremities w/o difficulty. Affect pleasant.  [id number] sinus rhythm with deep T-wave inversion appeared to be ischemic in the inferolateral leads and 1 mm ST elevation in V1 to V2 which probably related to LVH  Results for orders placed or performed during the hospital encounter of 12/21/15 (from the past 24 hour(s))  Basic metabolic panel     Status: Abnormal   Collection Time: 12/21/15 10:23 AM  Result Value Ref Range   Sodium 143 135 - 145 mmol/L   Potassium 2.8 (LL) 3.5 - 5.1 mmol/L   Chloride 105 101 - 111 mmol/L   CO2 26 22 - 32 mmol/L   Glucose, Bld 116 (H) 65 - 99 mg/dL   BUN 13 6 - 20 mg/dL   Creatinine, Ser 1.33 (H) 0.61 - 1.24 mg/dL   Calcium 8.8 (L) 8.9 - 10.3 mg/dL   GFR calc non Af Amer >60 >60 mL/min   GFR calc Af Amer >60 >60 mL/min   Anion gap 12 5 - 15  CBC     Status: Abnormal   Collection Time: 12/21/15 10:23 AM  Result Value Ref Range   WBC 10.4 3.8 - 10.6 K/uL   RBC 5.90 4.40 - 5.90  MIL/uL   Hemoglobin 12.4 (L) 13.0 - 18.0 g/dL   HCT 39.7 (L) 40.0 - 52.0 %   MCV 67.3 (L) 80.0 - 100.0 fL   MCH 21.1 (L) 26.0 - 34.0 pg   MCHC 31.3 (L) 32.0 - 36.0 g/dL   RDW 18.1 (H) 11.5 - 14.5 %   Platelets 288 150 - 440 K/uL  Troponin I     Status: Abnormal   Collection Time: 12/21/15 10:23 AM  Result Value Ref Range   Troponin I 0.15 (H) <0.031 ng/mL   Dg Chest 2 View  12/21/2015  CLINICAL DATA:  Acute chest pain. EXAM: CHEST  2 VIEW COMPARISON:  Dec 20, 2013. FINDINGS: Stable cardiomediastinal silhouette. No pneumothorax or pleural effusion is noted. Bony thorax is intact. Left lung is clear. Ill-defined nodular opacities are noted in the right upper lobe which may represent inflammation. IMPRESSION: Ill-defined nodular opacities seen in right upper lobe which may represent focal pneumonia. Follow-up PA and lateral views of  the chest in 3-4 weeks after antibiotic therapy trial is recommended to ensure resolution. Electronically Signed   By: Marijo Conception, M.D.   On: 12/21/2015 10:51     ASSESSMENT AND PLAN:Chest pain in the setting of cocaine use last night with EKG abnormalities. Patient had a EKG done in the past which was not much different than what is today and the troponins are only mildly elevated. Plus patient's chest pain has resolved. Likelihood of fixed coronary artery disease is low thus will treat the patient conservatively with aspirin and heparin and nitrates. I don't think we need to take him to the cardiac catheter lab. May do outpatient workup and advise echocardiogram.  [name] A

## 2015-12-21 NOTE — Progress Notes (Signed)
ANTICOAGULATION CONSULT NOTE - Initial Consult  Pharmacy Consult for Heparin Drip Indication: chest pain/ACS  No Known Allergies  Patient Measurements: Height: 5\' 11"  (180.3 cm) Weight: 275 lb (124.739 kg) IBW/kg (Calculated) : 75.3 Heparin Dosing Weight: 103.3 kg  Vital Signs: Temp: 98.1 F (36.7 C) (05/05 1019) Temp Source: Oral (05/05 1019) BP: 194/126 mmHg (05/05 1300) Pulse Rate: 103 (05/05 1230)  Labs:  Recent Labs  12/21/15 1023  HGB 12.4*  HCT 39.7*  PLT 288  CREATININE 1.33*  TROPONINI 0.15*    Estimated Creatinine Clearance: 104.3 mL/min (by C-G formula based on Cr of 1.33).   Medical History: Past Medical History  Diagnosis Date  . Hypertension     Medications:  Scheduled:  . hydrALAZINE  10 mg Intravenous STAT  . hydrALAZINE  25 mg Oral Q8H   Infusions:  . sodium chloride    . heparin 750 Units/hr (12/21/15 1303)    Assessment: Pharmacy consulted to dose Heparin in this 35 y.o. male presenting with chest pain and cough in the setting of cocaine use.  Goal of Therapy:  Heparin level 0.3-0.7 units/ml   Plan:  Give 4000 units bolus x 1 Start heparin infusion at 1300 units/hr Check anti-Xa level in 6 hours and daily while on heparin Continue to monitor H&H and platelets  HL ordered for 5/5 at 19:30.    Olivia Canter, RPh Clinical Pharmacist 12/21/2015,1:32 PM

## 2015-12-21 NOTE — Progress Notes (Signed)
*  PRELIMINARY RESULTS* Echocardiogram 2D Echocardiogram has been performed.  Gerald [name] 12/21/2015, 6:42 PM

## 2015-12-22 LAB — CBC
HCT: 40.4 % (ref 40.0–52.0)
Hemoglobin: 12.6 g/dL — ABNORMAL LOW (ref 13.0–18.0)
MCH: 21 pg — ABNORMAL LOW (ref 26.0–34.0)
MCHC: 31.1 g/dL — ABNORMAL LOW (ref 32.0–36.0)
MCV: 67.5 fL — ABNORMAL LOW (ref 80.0–100.0)
PLATELETS: 258 10*3/uL (ref 150–440)
RBC: 5.99 MIL/uL — AB (ref 4.40–5.90)
RDW: 18.2 % — AB (ref 11.5–14.5)
WBC: 9.8 10*3/uL (ref 3.8–10.6)

## 2015-12-22 LAB — BASIC METABOLIC PANEL
ANION GAP: 9 (ref 5–15)
BUN: 15 mg/dL (ref 6–20)
CALCIUM: 8.9 mg/dL (ref 8.9–10.3)
CO2: 24 mmol/L (ref 22–32)
Chloride: 107 mmol/L (ref 101–111)
Creatinine, Ser: 1.15 mg/dL (ref 0.61–1.24)
GLUCOSE: 119 mg/dL — AB (ref 65–99)
POTASSIUM: 3.1 mmol/L — AB (ref 3.5–5.1)
SODIUM: 140 mmol/L (ref 135–145)

## 2015-12-22 LAB — ECHOCARDIOGRAM COMPLETE
Height: 71 in
Weight: 5008 oz

## 2015-12-22 LAB — HEPARIN LEVEL (UNFRACTIONATED)
Heparin Unfractionated: <0.1 IU/mL — ABNORMAL LOW (ref 0.30–0.70)
Heparin Unfractionated: 0.11 IU/mL — ABNORMAL LOW (ref 0.30–0.70)

## 2015-12-22 MED ORDER — HEPARIN BOLUS VIA INFUSION
3100.0000 [IU] | Freq: Once | INTRAVENOUS | Status: AC
  Administered 2015-12-22: 3100 [IU] via INTRAVENOUS
  Filled 2015-12-22: qty 3100

## 2015-12-22 MED ORDER — CLONIDINE HCL 0.1 MG PO TABS
0.2000 mg | ORAL_TABLET | Freq: Two times a day (BID) | ORAL | Status: DC
  Administered 2015-12-22 – 2015-12-23 (×3): 0.2 mg via ORAL
  Filled 2015-12-22 (×3): qty 2

## 2015-12-22 MED ORDER — POTASSIUM CHLORIDE CRYS ER 20 MEQ PO TBCR
60.0000 meq | EXTENDED_RELEASE_TABLET | Freq: Once | ORAL | Status: AC
  Administered 2015-12-22: 60 meq via ORAL
  Filled 2015-12-22: qty 3

## 2015-12-22 MED ORDER — HYDRALAZINE HCL 50 MG PO TABS
50.0000 mg | ORAL_TABLET | Freq: Three times a day (TID) | ORAL | Status: DC
  Administered 2015-12-22: 50 mg via ORAL
  Filled 2015-12-22: qty 1

## 2015-12-22 MED ORDER — ASPIRIN EC 81 MG PO TBEC
81.0000 mg | DELAYED_RELEASE_TABLET | Freq: Every day | ORAL | Status: DC
  Administered 2015-12-22 – 2015-12-23 (×2): 81 mg via ORAL
  Filled 2015-12-22 (×2): qty 1

## 2015-12-22 MED ORDER — ISOSORBIDE MONONITRATE ER 30 MG PO TB24
30.0000 mg | ORAL_TABLET | Freq: Every day | ORAL | Status: DC
  Administered 2015-12-22 – 2015-12-23 (×2): 30 mg via ORAL
  Filled 2015-12-22 (×2): qty 1

## 2015-12-22 MED ORDER — CLONIDINE HCL ER 0.1 MG PO TB12: 0.2000 mg | ORAL_TABLET | Freq: Two times a day (BID) | ORAL | Status: DC

## 2015-12-22 MED ORDER — HYDRALAZINE HCL 50 MG PO TABS
100.0000 mg | ORAL_TABLET | Freq: Three times a day (TID) | ORAL | Status: DC
  Administered 2015-12-22 – 2015-12-23 (×3): 100 mg via ORAL
  Filled 2015-12-22 (×3): qty 2

## 2015-12-22 MED ORDER — HYDRALAZINE HCL 50 MG PO TABS: 50.0000 mg | ORAL_TABLET | Freq: Three times a day (TID) | ORAL | Status: DC

## 2015-12-22 NOTE — Progress Notes (Signed)
Per Dr. Bridgett Larsson okay to discontinue one time dose of aspirin 324mg  PO and order aspirin 81 mg daily.

## 2015-12-22 NOTE — Progress Notes (Signed)
[facility] at [place] NAME: Gerald [name]    MR#:  [id number]  DATE OF BIRTH:  27-Feb-1981  SUBJECTIVE:  CHIEF COMPLAINT:   Chief Complaint  Patient presents with  . Chest Pain  . Cough  . Shortness of Breath   No chest pain. REVIEW OF SYSTEMS:  CONSTITUTIONAL: No fever, fatigue or weakness.  EYES: No blurred or double vision.  EARS, NOSE, AND THROAT: No tinnitus or ear pain.  RESPIRATORY: No cough, shortness of breath, wheezing or hemoptysis.  CARDIOVASCULAR: No chest pain, orthopnea, edema.  GASTROINTESTINAL: No nausea, vomiting, diarrhea or abdominal pain.  GENITOURINARY: No dysuria, hematuria.  ENDOCRINE: No polyuria, nocturia,  HEMATOLOGY: No anemia, easy bruising or bleeding SKIN: No rash or lesion. MUSCULOSKELETAL: No joint pain or arthritis.   NEUROLOGIC: No tingling, numbness, weakness.  PSYCHIATRY: No anxiety or depression.   DRUG ALLERGIES:  No Known Allergies  VITALS:  Blood pressure 146/78, pulse 92, temperature 97.5 F (36.4 C), temperature source Oral, resp. rate 22, height 5\' 11"  (1.803 m), weight 141.976 kg (313 lb), SpO2 100 %.  PHYSICAL EXAMINATION:  GENERAL:  35 y.o.-year-old patient lying in the bed with no acute distress.  EYES: Pupils equal, round, reactive to light and accommodation. No scleral icterus. Extraocular muscles intact.  HEENT: Head atraumatic, normocephalic. Oropharynx and nasopharynx clear.  NECK:  Supple, no jugular venous distention. No thyroid enlargement, no tenderness.  LUNGS: Normal breath sounds bilaterally, no wheezing, rales,rhonchi or crepitation. No use of accessory muscles of respiration.  CARDIOVASCULAR: S1, S2 normal. No murmurs, rubs, or gallops.  ABDOMEN: Soft, nontender, nondistended. Bowel sounds present. No organomegaly or mass.  EXTREMITIES: No pedal edema, cyanosis, or clubbing.  NEUROLOGIC: Cranial nerves II through XII are intact. Muscle strength 5/5 in all  extremities. Sensation intact. Gait not checked.  PSYCHIATRIC: The patient is alert and oriented x 3.  SKIN: No obvious rash, lesion, or ulcer.    LABORATORY PANEL:   CBC  Recent Labs Lab 12/22/15 1002  WBC 9.8  HGB 12.6*  HCT 40.4  PLT 258   ------------------------------------------------------------------------------------------------------------------  Chemistries   Recent Labs Lab 12/21/15 1913 12/22/15 1002  NA  --  140  K  --  3.1*  CL  --  107  CO2  --  24  GLUCOSE  --  119*  BUN  --  15  CREATININE  --  1.15  CALCIUM  --  8.9  MG 2.2  --    ------------------------------------------------------------------------------------------------------------------  Cardiac Enzymes  Recent Labs Lab 12/21/15 1913  TROPONINI 0.18*   ------------------------------------------------------------------------------------------------------------------  RADIOLOGY:  Dg Chest 2 View  12/21/2015  CLINICAL DATA:  Acute chest pain. EXAM: CHEST  2 VIEW COMPARISON:  Dec 20, 2013. FINDINGS: Stable cardiomediastinal silhouette. No pneumothorax or pleural effusion is noted. Bony thorax is intact. Left lung is clear. Ill-defined nodular opacities are noted in the right upper lobe which may represent inflammation. IMPRESSION: Ill-defined nodular opacities seen in right upper lobe which may represent focal pneumonia. Follow-up PA and lateral views of the chest in 3-4 weeks after antibiotic therapy trial is recommended to ensure resolution. Electronically Signed   By: Marijo Conception, M.D.   On: 12/21/2015 10:51    EKG:   Orders placed or performed during the hospital encounter of 12/21/15  . EKG 12-Lead  . EKG 12-Lead  . ED EKG within 10 minutes  . ED EKG within 10 minutes  . ED EKG  . ED EKG  ASSESSMENT AND PLAN:   Non-STEMI He was started heparin drip,  aspirin and Lipitor and Nitroglycerin when necessary.  Discontinue heparin drip and continue aspirin per Dr.  Humphrey Rolls.  Cocaine abuse. Drug abuse sensation was counseled.  Pneumonia (CAP) with leukocytosis. Continue Levaquin, follow-up blood culture and sputum culture.  Hypertension urgency. Better controlled. He is on hydralazine IV when necessary. Add imdur and increased hydralazine by mouth. No beta blocker due to cocaine abuse.  Hypokalemia. Give potassium supplement and follow-up potassium level.  Acute kidney injury. Improved with IV fluid support.  Tobacco abuse. Smoking cessation was counseled for 3-4 minutes, given nicotine patch.   I discussed with Dr. Humphrey Rolls.  All the records are reviewed and case discussed with Care Management/Social Workerr. Management plans discussed with the patient, family and they are in agreement.  CODE STATUS: Full code  TOTAL TIME TAKING CARE OF THIS PATIENT: 35 minutes.  Greater than 50% time was spent on coordination of care and face-to-face counseling.  POSSIBLE D/C IN 2 DAYS, DEPENDING ON CLINICAL CONDITION.   Demetrios Loll M.D on 12/22/2015 at 3:12 PM  Between 7am to 6pm - Pager - [phone]  After 6pm go to [email or web address] - password [email or web address] [facility]  [place] [facility]  Office  [phone]  CC: Primary care physician; [name] Patient

## 2015-12-22 NOTE — Progress Notes (Signed)
Dr Benjie Karvonen notified of patient's most recent BP(178/98) after previous interventions. No additional interventions at this time. Will continue to monitor and notify her of any changes. Also, notified of 10/10 pain patient experiencing, no new orders.

## 2015-12-22 NOTE — Progress Notes (Signed)
Pt given hydralazine 10 mg IV. Patient current BP is 187/93 HR 98. Notified Dr. Bridgett Larsson. Per MD give hydralazine 50mg  PO now. Will continue to monitor patient Gerald [name]

## 2015-12-22 NOTE — Progress Notes (Signed)
ANTICOAGULATION CONSULT NOTE - Initial Consult  Pharmacy Consult for Heparin Drip Indication: chest pain/ACS  No Known Allergies  Patient Measurements: Height: 5\' 11"  (180.3 cm) (stated) Weight: (!) 313 lb (141.976 kg) (admission) IBW/kg (Calculated) : 75.3 Heparin Dosing Weight: 103.3 kg  Vital Signs: Temp: 98.1 F (36.7 C) (05/06 0414) Temp Source: Oral (05/06 0414) BP: 193/110 mmHg (05/06 0414) Pulse Rate: 103 (05/06 0414)  Labs:  Recent Labs  12/21/15 1023 12/21/15 1348 12/21/15 1913 12/22/15 0335  HGB 12.4*  --   --   --   HCT 39.7*  --   --   --   PLT 288  --   --   --   HEPARINUNFRC  --   --  <0.10* <0.10*  CREATININE 1.33*  --   --   --   TROPONINI 0.15* 0.16* 0.18*  --     Estimated Creatinine Clearance: 111.8 mL/min (by C-G formula based on Cr of 1.33).   Medical History: Past Medical History  Diagnosis Date  . Hypertension     Medications:  Scheduled:  . aspirin  324 mg Oral NOW   Or  . aspirin  300 mg Rectal NOW  . atorvastatin  40 mg Oral q1800  . heparin  3,100 Units Intravenous Once  . hydrALAZINE  25 mg Oral Q8H  . levofloxacin (LEVAQUIN) IV  750 mg Intravenous Q24H  . nicotine  14 mg Transdermal Daily  . pneumococcal 23 valent vaccine  0.5 mL Intramuscular Tomorrow-1000   Infusions:  . sodium chloride 10 mL/hr (12/21/15 1554)  . sodium chloride 75 mL/hr at 12/22/15 0139  . heparin 1,700 Units/hr (12/22/15 0138)    Assessment: Pharmacy consulted to dose Heparin in this 35 y.o. male presenting with chest pain and cough in the setting of cocaine use.  Goal of Therapy:  Heparin level 0.3-0.7 units/ml   Plan:  Give 4000 units bolus x 1 Start heparin infusion at 1300 units/hr Check anti-Xa level in 6 hours and daily while on heparin Continue to monitor H&H and platelets  HL ordered for 5/5 at 19:30.    5/5:  HL @ 19:30 = < 0.1.   Will order Heparin 3100 units IV X 1 bolus and incease gtt rate to 1700 units/hr.         Will  recheck HL 6 hrs after rate change on 5/6 @ 3:00.   5/5 03:00 heparin level still <0.1. 3100 unit bolus and increase to 2100 units/hr. Recheck in 6 hours.  Conrado Nance S, RPh Clinical Pharmacist 12/22/2015,4:18 AM

## 2015-12-22 NOTE — Progress Notes (Signed)
SUBJECTIVE: Patient denies any chest pain feeling much better   Filed Vitals:   12/22/15 0632 12/22/15 0831 12/22/15 1004 12/22/15 1007  BP: 178/96 192/107 191/102 187/93  Pulse: 99 97 97 98  Temp:      TempSrc:      Resp:      Height:      Weight:      SpO2:        Intake/Output Summary (Last 24 hours) at 12/22/15 1150 Last data filed at 12/22/15 1034  Gross per 24 hour  Intake 2299.47 ml  Output   2875 ml  Net -575.53 ml    LABS: Basic Metabolic Panel:  Recent Labs  12/21/15 1023 12/21/15 1913 12/22/15 1002  NA 143  --  140  K 2.8*  --  3.1*  CL 105  --  107  CO2 26  --  24  GLUCOSE 116*  --  119*  BUN 13  --  15  CREATININE 1.33*  --  1.15  CALCIUM 8.8*  --  8.9  MG  --  2.2  --    Liver Function Tests: No results for input(s): AST, ALT, ALKPHOS, BILITOT, PROT, ALBUMIN in the last 72 hours. No results for input(s): LIPASE, AMYLASE in the last 72 hours. CBC:  Recent Labs  12/21/15 1023 12/22/15 1002  WBC 10.4 9.8  HGB 12.4* 12.6*  HCT 39.7* 40.4  MCV 67.3* 67.5*  PLT 288 258   Cardiac Enzymes:  Recent Labs  12/21/15 1023 12/21/15 1348 12/21/15 1913  TROPONINI 0.15* 0.16* 0.18*   BNP: Invalid input(s): POCBNP D-Dimer: No results for input(s): DDIMER in the last 72 hours. Hemoglobin A1C: No results for input(s): HGBA1C in the last 72 hours. Fasting Lipid Panel: No results for input(s): CHOL, HDL, LDLCALC, TRIG, CHOLHDL, LDLDIRECT in the last 72 hours. Thyroid Function Tests: No results for input(s): TSH, T4TOTAL, T3FREE, THYROIDAB in the last 72 hours.  Invalid input(s): FREET3 Anemia Panel: No results for input(s): VITAMINB12, FOLATE, FERRITIN, TIBC, IRON, RETICCTPCT in the last 72 hours.   PHYSICAL EXAM General: Well developed, well nourished, in no acute distress HEENT:  Normocephalic and atramatic Neck:  No JVD.  Lungs: Clear bilaterally to auscultation and percussion. Heart: HRRR . Normal S1 and S2 without gallops or murmurs.   Abdomen: Bowel sounds are positive, abdomen soft and non-tender  Msk:  Back normal, normal gait. Normal strength and tone for age. Extremities: No clubbing, cyanosis or edema.   Neuro: Alert and oriented X 3. Psych:  Good affect, responds appropriately  TELEMETRY:Sinus rhythm  ASSESSMENT AND PLAN: Atypical chest pain due to cocaine use. Advise aspirin and isosorbide 30 mg by mouth once a day. Patient has uncontrolled hypertension probably related to withdrawal from cocaine use and will add clonidine 0.2 mg twice a day on top of hydralazine which will be increased to 100 twice a day.  Principal Problem:   NSTEMI (non-ST elevated myocardial infarction) [facility]) Active Problems:   Community acquired pneumonia    Dionisio David, MD, [facility] 12/22/2015 11:50 AM     Mode the top

## 2015-12-22 NOTE — Progress Notes (Signed)
Notified by CCMD patient had 4 beat run of Vtach. Notified Dr. Bridgett Larsson. No new orders at this time. Patient is in bed sleeping with no complaints. Will continue to monitor Gerald [name]

## 2015-12-23 LAB — CBC
HCT: 37.8 % — ABNORMAL LOW (ref 40.0–52.0)
Hemoglobin: 12.1 g/dL — ABNORMAL LOW (ref 13.0–18.0)
MCH: 21.5 pg — AB (ref 26.0–34.0)
MCHC: 32.1 g/dL (ref 32.0–36.0)
MCV: 66.9 fL — AB (ref 80.0–100.0)
PLATELETS: 259 10*3/uL (ref 150–440)
RBC: 5.65 MIL/uL (ref 4.40–5.90)
RDW: 18.1 % — AB (ref 11.5–14.5)
WBC: 9.5 10*3/uL (ref 3.8–10.6)

## 2015-12-23 LAB — BASIC METABOLIC PANEL
Anion gap: 9 (ref 5–15)
BUN: 20 mg/dL (ref 6–20)
CO2: 22 mmol/L (ref 22–32)
Calcium: 8.8 mg/dL — ABNORMAL LOW (ref 8.9–10.3)
Chloride: 108 mmol/L (ref 101–111)
Creatinine, Ser: 1.43 mg/dL — ABNORMAL HIGH (ref 0.61–1.24)
GFR calc Af Amer: >60 mL/min (ref >60)
GLUCOSE: 101 mg/dL — AB (ref 65–99)
POTASSIUM: 3.7 mmol/L (ref 3.5–5.1)
Sodium: 139 mmol/L (ref 135–145)

## 2015-12-23 LAB — HIV ANTIBODY (ROUTINE TESTING W REFLEX): HIV Screen 4th Generation wRfx: NONREACTIVE

## 2015-12-23 MED ORDER — ASPIRIN 81 MG PO TBEC: 81.0000 mg | DELAYED_RELEASE_TABLET | Freq: Every day | ORAL | Status: DC

## 2015-12-23 MED ORDER — LEVOFLOXACIN 500 MG PO TABS: 500.0000 mg | ORAL_TABLET | Freq: Every day | ORAL | Status: DC

## 2015-12-23 MED ORDER — CLONIDINE HCL 0.2 MG PO TABS
0.2000 mg | ORAL_TABLET | Freq: Two times a day (BID) | ORAL | Status: DC
Start: 2015-12-23 — End: 2018-06-27

## 2015-12-23 MED ORDER — HYDRALAZINE HCL 100 MG PO TABS
100.0000 mg | ORAL_TABLET | Freq: Three times a day (TID) | ORAL | Status: DC
Start: 2015-12-23 — End: 2018-06-27

## 2015-12-23 MED ORDER — ATORVASTATIN CALCIUM 40 MG PO TABS: 40.0000 mg | ORAL_TABLET | Freq: Every day | ORAL | Status: DC

## 2015-12-23 MED ORDER — ISOSORBIDE MONONITRATE ER 30 MG PO TB24: 30.0000 mg | ORAL_TABLET | Freq: Every day | ORAL | Status: DC

## 2015-12-23 NOTE — Progress Notes (Signed)
SUBJECTIVE: Patient is comfortable no chest pain or shortness of breath   Filed Vitals:   12/23/15 0510 12/23/15 0638 12/23/15 0938 12/23/15 1104  BP: 179/99 144/81 162/100 157/104  Pulse: 92 90 88 88  Temp: 97.5 F (36.4 C)   98 F (36.7 C)  TempSrc: Oral   Oral  Resp: 24   18  Height:      Weight:      SpO2: 98%   97%    Intake/Output Summary (Last 24 hours) at 12/23/15 1135 Last data filed at 12/23/15 0830  Gross per 24 hour  Intake 1864.5 ml  Output    250 ml  Net 1614.5 ml    LABS: Basic Metabolic Panel:  Recent Labs  12/21/15 1913 12/22/15 1002 12/23/15 0419  NA  --  140 139  K  --  3.1* 3.7  CL  --  107 108  CO2  --  24 22  GLUCOSE  --  119* 101*  BUN  --  15 20  CREATININE  --  1.15 1.43*  CALCIUM  --  8.9 8.8*  MG 2.2  --   --    Liver Function Tests: No results for input(s): AST, ALT, ALKPHOS, BILITOT, PROT, ALBUMIN in the last 72 hours. No results for input(s): LIPASE, AMYLASE in the last 72 hours. CBC:  Recent Labs  12/22/15 1002 12/23/15 0419  WBC 9.8 9.5  HGB 12.6* 12.1*  HCT 40.4 37.8*  MCV 67.5* 66.9*  PLT 258 259   Cardiac Enzymes:  Recent Labs  12/21/15 1023 12/21/15 1348 12/21/15 1913  TROPONINI 0.15* 0.16* 0.18*   BNP: Invalid input(s): POCBNP D-Dimer: No results for input(s): DDIMER in the last 72 hours. Hemoglobin A1C: No results for input(s): HGBA1C in the last 72 hours. Fasting Lipid Panel: No results for input(s): CHOL, HDL, LDLCALC, TRIG, CHOLHDL, LDLDIRECT in the last 72 hours. Thyroid Function Tests: No results for input(s): TSH, T4TOTAL, T3FREE, THYROIDAB in the last 72 hours.  Invalid input(s): FREET3 Anemia Panel: No results for input(s): VITAMINB12, FOLATE, FERRITIN, TIBC, IRON, RETICCTPCT in the last 72 hours.   PHYSICAL EXAM General: Well developed, well nourished, in no acute distress HEENT:  Normocephalic and atramatic Neck:  No JVD.  Lungs: Clear bilaterally to auscultation and  percussion. Heart: HRRR . Normal S1 and S2 without gallops or murmurs.  Abdomen: Bowel sounds are positive, abdomen soft and non-tender  Msk:  Back normal, normal gait. Normal strength and tone for age. Extremities: No clubbing, cyanosis or edema.   Neuro: Alert and oriented X 3. Psych:  Good affect, responds appropriately  TELEMETRY:Sinus rhythm  ASSESSMENT AND PLAN: Atypical chest pain due to cocaine use and mildly elevated troponin. No further chest pain and blood pressure is much better controlled with current medications. May go home with follow-up in the office next week.  Principal Problem:   NSTEMI (non-ST elevated myocardial infarction) [facility]) Active Problems:   Community acquired pneumonia    Gerald David, MD, [facility] 12/23/2015 11:35 AM

## 2015-12-23 NOTE — Discharge Instructions (Signed)
Heart healthy diet. Activity as tolerated. Smoking and drug abuse cessation. Exercise and diet control.

## 2015-12-23 NOTE — Discharge Summary (Signed)
[place] [facility] [place] NAME: Gerald [name]    MR#:  [id number]  DATE OF BIRTH:  [birth year]  DATE OF ADMISSION:  12/21/2015 ADMITTING PHYSICIAN: Demetrios Loll, MD  DATE OF DISCHARGE: 12/23/2015  PRIMARY CARE PHYSICIAN: [name] Patient    ADMISSION DIAGNOSIS:  Hypokalemia [E87.6] Cocaine abuse [F14.10] CAP (community acquired pneumonia) [J18.9] NSTEMI (non-ST elevated myocardial infarction) ([place]) [I21.4]   DISCHARGE DIAGNOSIS:  Non-STEMI Cocaine abuse. Pneumonia (CAP) Hypertension urgency Acute kidney injury SECONDARY DIAGNOSIS:   Past Medical History  Diagnosis Date  . Hypertension     HOSPITAL COURSE:  Non-STEMI He was started heparin drip, aspirin and Lipitor and Nitroglycerin when necessary.  Off heparin drip and continue aspirin per Dr. Humphrey Rolls.  Cocaine abuse. Drug abuse sensation was counseled.  Pneumonia (CAP) with leukocytosis. He has been treated with Levaquin, negative blood culture so far, continue by mouth Levaquin.  Hypertension urgency. Better controlled. He is on hydralazine IV when necessary. Addrd imdur and clonidine, increased hydralazine by mouth 100 mg 3 times a day. No beta blocker due to cocaine abuse.  Hypokalemia. Give potassium supplement and follow-up potassium level.  Acute kidney injury. Improved with IV fluid support. The creatinine increased to 1.43 this morning. Encouraged the patient oral fluid intake and a follow-up BMP as outpatient.  Tobacco abuse. Smoking cessation was counseled for 3-4 minutes, given nicotine patch. Morbid obese.  I discussed with Dr. Humphrey Rolls.    DISCHARGE CONDITIONS:   Stable, discharge to home today.  CONSULTS OBTAINED:  Treatment Team:  Dionisio David, MD  DRUG ALLERGIES:  No Known Allergies  DISCHARGE MEDICATIONS:   Current Discharge Medication List    START taking these medications   Details  aspirin EC 81 MG EC tablet Take 1 tablet (81 mg total) by  mouth daily. Qty: 30 tablet, Refills: 2    atorvastatin (LIPITOR) 40 MG tablet Take 1 tablet (40 mg total) by mouth daily at 6 PM. Qty: 30 tablet, Refills: 0    cloNIDine (CATAPRES) 0.2 MG tablet Take 1 tablet (0.2 mg total) by mouth 2 (two) times daily. Qty: 60 tablet, Refills: 2    hydrALAZINE (APRESOLINE) 100 MG tablet Take 1 tablet (100 mg total) by mouth every 8 (eight) hours. Qty: 90 tablet, Refills: 2    isosorbide mononitrate (IMDUR) 30 MG 24 hr tablet Take 1 tablet (30 mg total) by mouth daily. Qty: 30 tablet, Refills: 2    levofloxacin (LEVAQUIN) 500 MG tablet Take 1 tablet (500 mg total) by mouth daily. Qty: 5 tablet, Refills: 0         DISCHARGE INSTRUCTIONS:     If you experience worsening of your admission symptoms, develop shortness of breath, life threatening emergency, suicidal or homicidal thoughts you must seek medical attention immediately by calling 911 or calling your MD immediately  if symptoms less severe.  You Must read complete instructions/literature along with all the possible adverse reactions/side effects for all the Medicines you take and that have been prescribed to you. Take any new Medicines after you have completely understood and accept all the possible adverse reactions/side effects.   Please note  You were cared for by a hospitalist during your hospital stay. If you have any questions about your discharge medications or the care you received while you were in the hospital after you are discharged, you can call the unit and asked to speak with the hospitalist on call if the hospitalist that took care  of you is not available. Once you are discharged, your primary care physician will handle any further medical issues. Please note that NO REFILLS for any discharge medications will be authorized once you are discharged, as it is imperative that you return to your primary care physician (or establish a relationship with a primary care physician if you  do not have one) for your aftercare needs so that they can reassess your need for medications and monitor your lab values.    Today   SUBJECTIVE   No complaint except cough.   VITAL SIGNS:  Blood pressure 157/104, pulse 88, temperature 98 F (36.7 C), temperature source Oral, resp. rate 18, height 5\' 11"  (1.803 m), weight 141.976 kg (313 lb), SpO2 97 %.  I/O:   Intake/Output Summary (Last 24 hours) at 12/23/15 1217 Last data filed at 12/23/15 0830  Gross per 24 hour  Intake 1864.5 ml  Output    250 ml  Net 1614.5 ml    PHYSICAL EXAMINATION:  GENERAL:  35 y.o.-year-old patient lying in the bed with no acute distress. Obese obese. EYES: Pupils equal, round, reactive to light and accommodation. No scleral icterus. Extraocular muscles intact.  HEENT: Head atraumatic, normocephalic. Oropharynx and nasopharynx clear.  NECK:  Supple, no jugular venous distention. No thyroid enlargement, no tenderness.  LUNGS: Normal breath sounds bilaterally, no wheezing, rales,rhonchi or crepitation. No use of accessory muscles of respiration.  CARDIOVASCULAR: S1, S2 normal. No murmurs, rubs, or gallops.  ABDOMEN: Soft, non-tender, non-distended. Bowel sounds present. No organomegaly or mass.  EXTREMITIES: No pedal edema, cyanosis, or clubbing.  NEUROLOGIC: Cranial nerves II through XII are intact. Muscle strength 5/5 in all extremities. Sensation intact. Gait not checked.  PSYCHIATRIC: The patient is alert and oriented x 3.  SKIN: No obvious rash, lesion, or ulcer.   DATA REVIEW:   CBC  Recent Labs Lab 12/23/15 0419  WBC 9.5  HGB 12.1*  HCT 37.8*  PLT 259    Chemistries   Recent Labs Lab 12/21/15 1913  12/23/15 0419  NA  --   < > 139  K  --   < > 3.7  CL  --   < > 108  CO2  --   < > 22  GLUCOSE  --   < > 101*  BUN  --   < > 20  CREATININE  --   < > 1.43*  CALCIUM  --   < > 8.8*  MG 2.2  --   --   < > = values in this interval not displayed.  Cardiac Enzymes  Recent  Labs Lab 12/21/15 1913  TROPONINI 0.18*    Microbiology Results  Results for orders placed or performed during the hospital encounter of 12/21/15  Blood culture (routine x 2)     Status: None (Preliminary result)   Collection Time: 12/21/15 11:44 AM  Result Value Ref Range Status   Specimen Description BLOOD LEFT ARM  Final   Special Requests BOTTLES DRAWN AEROBIC AND ANAEROBIC  1CC  Final   Culture NO GROWTH 1 DAY  Final   Report Status PENDING  Incomplete  Blood culture (routine x 2)     Status: None (Preliminary result)   Collection Time: 12/21/15 11:44 AM  Result Value Ref Range Status   Specimen Description BLOOD LEFT ANTECUBITAL  Final   Special Requests BOTTLES DRAWN AEROBIC AND ANAEROBIC  1CC  Final   Culture NO GROWTH 1 DAY  Final   Report Status PENDING  Incomplete    RADIOLOGY:  No results found.      Management plans discussed with the patient, family and they are in agreement.  CODE STATUS:     Code Status Orders        Start     Ordered   12/21/15 1501  Full code   Continuous     12/21/15 1501    Code Status History    Date Active Date Inactive Code Status Order ID Comments User Context   This patient has a current code status but no historical code status.      TOTAL TIME TAKING CARE OF THIS PATIENT: 35  minutes.    Demetrios Loll M.D on 12/23/2015 at 12:17 PM  Between 7am to 6pm - Pager - [phone]  After 6pm go to [email or web address] - password [email or web address] [facility]  [place] [facility]  Office  [phone]  CC: Primary care physician; [name] Patient

## 2015-12-23 NOTE — Progress Notes (Signed)
Patient received discharge instructions, pt verbalized understanding. IV was removed with no signs of infection. Dressing clean, dry intact. No skin tears or wounds present. Prescription was printed and given to patient. Jeani Hawking, Care management provided patient with information to get free prescription and coupons. Patient was escorted out with staff member via wheelchair via private auto. No further needs from care management team.

## 2015-12-23 NOTE — Care Management Note (Signed)
Case Management Note  Patient Details  Name: Gerald [name] MRN: [id number] Date of Birth: 1981/07/31  Subjective/Objective:     Provided uninsured Mr Gerald [name] with a list of local clinics that serve the uninsured. Provided him with coupons for all his medications from [email or web address]  Provided him with an application to [facility].  Encouraged Mr Gerald [name] to made an appointment at one of the local clinics on the list provided so that he could have a PCP to provide on-going outpatient care.              Action/Plan:   Expected Discharge Date:                  Expected Discharge Plan:     In-House Referral:     Discharge planning Services     Post Acute Care Choice:    Choice offered to:     DME Arranged:    DME Agency:     HH Arranged:    [place] Agency:     Status of Service:     Medicare Important Message Given:    Date Medicare IM Given:    Medicare IM give by:    Date Additional Medicare IM Given:    Additional Medicare Important Message give by:     If discussed at [place] of Stay Meetings, dates discussed:    Additional Comments:  Miamarie Moll A, RN 12/23/2015, 1:03 PM

## 2015-12-26 LAB — CULTURE, BLOOD (ROUTINE X 2)
CULTURE: NO GROWTH
Culture: NO GROWTH

## 2016-01-02 ENCOUNTER — Ambulatory Visit: Payer: Self-pay

## 2018-06-27 ENCOUNTER — Other Ambulatory Visit: Payer: Self-pay

## 2018-06-27 ENCOUNTER — Emergency Department
Admission: EM | Admit: 2018-06-27 | Discharge: 2018-06-27 | Disposition: A | Discharge status: Home / Self Care | Payer: Self-pay | Attending: Emergency Medicine | Admitting: Emergency Medicine

## 2018-06-27 DIAGNOSIS — F17210 Nicotine dependence, cigarettes, uncomplicated: Secondary | ICD-10-CM | POA: Insufficient documentation

## 2018-06-27 DIAGNOSIS — A64 Unspecified sexually transmitted disease: Secondary | ICD-10-CM

## 2018-06-27 DIAGNOSIS — Z79899 Other long term (current) drug therapy: Secondary | ICD-10-CM | POA: Insufficient documentation

## 2018-06-27 DIAGNOSIS — I10 Essential (primary) hypertension: Secondary | ICD-10-CM | POA: Insufficient documentation

## 2018-06-27 DIAGNOSIS — Z113 Encounter for screening for infections with a predominantly sexual mode of transmission: Secondary | ICD-10-CM | POA: Insufficient documentation

## 2018-06-27 DIAGNOSIS — I252 Old myocardial infarction: Secondary | ICD-10-CM | POA: Insufficient documentation

## 2018-06-27 DIAGNOSIS — Z7982 Long term (current) use of aspirin: Secondary | ICD-10-CM | POA: Insufficient documentation

## 2018-06-27 LAB — CHLAMYDIA/NGC RT PCR (ARMC ONLY)
CHLAMYDIA TR: NOT DETECTED
N GONORRHOEAE: NOT DETECTED

## 2018-06-27 MED ORDER — ISOSORBIDE MONONITRATE ER 30 MG PO TB24: 30.0000 mg | ORAL_TABLET | Freq: Every day | ORAL | 2 refills | Status: DC

## 2018-06-27 MED ORDER — HYDRALAZINE HCL 100 MG PO TABS: 100.0000 mg | ORAL_TABLET | Freq: Three times a day (TID) | ORAL | 2 refills | Status: DC

## 2018-06-27 MED ORDER — HYDRALAZINE HCL 50 MG PO TABS
100.0000 mg | ORAL_TABLET | Freq: Once | ORAL | Status: AC
  Administered 2018-06-27: 100 mg via ORAL
  Filled 2018-06-27: qty 2

## 2018-06-27 MED ORDER — AZITHROMYCIN 500 MG PO TABS
1000.0000 mg | ORAL_TABLET | Freq: Once | ORAL | Status: AC
  Administered 2018-06-27: 1000 mg via ORAL
  Filled 2018-06-27: qty 2

## 2018-06-27 MED ORDER — CLONIDINE HCL 0.2 MG PO TABS: 0.2000 mg | ORAL_TABLET | Freq: Two times a day (BID) | ORAL | 2 refills | Status: DC

## 2018-06-27 MED ORDER — ATORVASTATIN CALCIUM 40 MG PO TABS: 40.0000 mg | ORAL_TABLET | Freq: Every day | ORAL | 0 refills | Status: DC

## 2018-06-27 MED ORDER — LIDOCAINE HCL (PF) 1 % IJ SOLN
INTRAMUSCULAR | Status: AC
  Administered 2018-06-27: 20:00:00
  Filled 2018-06-27: qty 5

## 2018-06-27 MED ORDER — ISOSORBIDE MONONITRATE ER 60 MG PO TB24
30.0000 mg | ORAL_TABLET | Freq: Every day | ORAL | Status: DC
  Administered 2018-06-27: 30 mg via ORAL
  Filled 2018-06-27: qty 1

## 2018-06-27 MED ORDER — CEFTRIAXONE SODIUM 250 MG IJ SOLR
250.0000 mg | Freq: Once | INTRAMUSCULAR | Status: AC
  Administered 2018-06-27: 250 mg via INTRAMUSCULAR
  Filled 2018-06-27: qty 250

## 2018-06-27 NOTE — ED Notes (Signed)
Lidocaine 1% used to dilute rocephin shot per MD Corky Downs, verbal order.

## 2018-06-27 NOTE — ED Notes (Signed)
Per Dr. Burlene Arnt no blood work at this time.

## 2018-06-27 NOTE — ED Notes (Signed)
Pt is eating in triage. No distress noted. Speaking in complete sentences.

## 2018-06-27 NOTE — ED Triage Notes (Signed)
Pt states hx HTN, states has been out of meds x 6 months. When asked who prescribes his BP meds he states "actually y'all do." informed pt that ED can only prescribe a months dose, pt states he can't remember who prescribes it or where he gets it filled.   A&O, ambulatory.   Also wants to be checked for gonorrhea/clyamydia d/t having sex with someone who has it.

## 2018-06-27 NOTE — ED Provider Notes (Signed)
[facility] Provider Note   ____________________________________________    I have reviewed the triage vital signs and the nursing notes.   HISTORY  Chief Complaint Hypertension     HPI Gerald [name] is a 37 y.o. male who presents for medication refill because he wants to be checked for an STD.  Patient reports he has not had his blood pressure medications about 6 months, typically gets prescriptions from the emergency department over the hospital.  Has not followed up with PCP.  Also reports that his significant other may have "been with someone else ".  He does describe a small amount of penile discharge.   Past Medical History:  Diagnosis Date  . Hypertension     Patient Active Problem List   Diagnosis Date Noted  . NSTEMI (non-ST elevated myocardial infarction) ([place]) 12/21/2015  . Community acquired pneumonia 12/21/2015    Past Surgical History:  Procedure Laterality Date  . KNEE SURGERY Right     Prior to Admission medications   Medication Sig Start Date End Date Taking? Authorizing Provider  aspirin EC 81 MG EC tablet Take 1 tablet (81 mg total) by mouth daily. 12/23/15   Demetrios Loll, MD  atorvastatin (LIPITOR) 40 MG tablet Take 1 tablet (40 mg total) by mouth daily at 6 PM. 06/27/18   Lavonia Drafts, MD  cloNIDine (CATAPRES) 0.2 MG tablet Take 1 tablet (0.2 mg total) by mouth 2 (two) times daily. 06/27/18   Lavonia Drafts, MD  hydrALAZINE (APRESOLINE) 100 MG tablet Take 1 tablet (100 mg total) by mouth every 8 (eight) hours. 06/27/18   Lavonia Drafts, MD  isosorbide mononitrate (IMDUR) 30 MG 24 hr tablet Take 1 tablet (30 mg total) by mouth daily. 06/27/18   Lavonia Drafts, MD     Allergies Patient has no known allergies.  Family History  Problem Relation Age of Onset  . Hypertension Father     Social History Social History   Tobacco Use  . Smoking status: Current Every Day Smoker    Packs/day: 1.00    Types: Cigarettes  Substance Use Topics  . Alcohol use: Yes    Comment: occas.   . Drug use: Yes    Types: Cocaine    Review of Systems  Constitutional: No fever/chills  ENT: No sore throat. CV: No chest pain Resp: no sob  Gastrointestinal: No abdominal pain.  No nausea, no vomiting.   Genitourinary: As above Musculoskeletal: Negative for back pain. Skin: Negative for rash. Neurological: Negative for headaches     ____________________________________________   PHYSICAL EXAM:  VITAL SIGNS: ED Triage Vitals  Enc Vitals Group     BP 06/27/18 1824 (!) 207/121     Pulse Rate 06/27/18 1824 99     Resp 06/27/18 1824 16     Temp 06/27/18 1824 98.5 F (36.9 C)     Temp Source 06/27/18 1824 Oral     SpO2 06/27/18 1824 93 %     Weight 06/27/18 1825 120.2 kg (265 lb)     Height 06/27/18 1825 1.803 m (5\' 11" )     Head Circumference --      Peak Flow --      Pain Score 06/27/18 1825 0     Pain Loc --      Pain Edu? --      Excl. in [place]? --     Constitutional: Alert and oriented. No acute distress. Pleasant and interactive Eyes: Conjunctivae are normal.  Head: Atraumatic. Nose: No congestion/rhinnorhea. Mouth/Throat: Mucous membranes are moist.   Cardiovascular: Normal rate, regular rhythm.  Respiratory: Normal respiratory effort.  No retractions.  Clear to auscultation bilaterally Genitourinary: deferred per patient request Musculoskeletal: No lower extremity tenderness nor edema.   Neurologic:  Normal speech and language. No gross focal neurologic deficits are appreciated.   Skin:  Skin is warm, dry and intact. No rash noted.   ____________________________________________   LABS (all labs ordered are listed, but only abnormal results are displayed)  Labs Reviewed  CHLAMYDIA/NGC RT PCR ([facility] ONLY)    ____________________________________________  EKG   ____________________________________________  RADIOLOGY  None ____________________________________________   PROCEDURES  Procedure(s) performed: No  Procedures   Critical Care performed: No ____________________________________________   INITIAL IMPRESSION / ASSESSMENT AND PLAN / ED COURSE  Pertinent labs & imaging results that were available during my care of the patient were reviewed by me and considered in my medical decision making (see chart for details).  Patient with chronic hypertension, off meds for 6 months.  Asymptomatic in this regard.  Also reports penile discharge.  Will send GC chlamydia urine test but treat given his symptoms.  250 IM Rocephin, 1 g azithromycin p.o.  Additionally we will restart his home medications here and refill his prescription   ____________________________________________   FINAL CLINICAL IMPRESSION(S) / ED DIAGNOSES  Final diagnoses:  Essential hypertension  STD (male)      NEW MEDICATIONS STARTED DURING THIS VISIT:  Current Discharge Medication List       Note:  This document was prepared using Dragon voice recognition software and may include unintentional dictation errors.    Lavonia Drafts, MD 06/27/18 1929

## 2019-04-01 ENCOUNTER — Emergency Department: Payer: Self-pay

## 2019-04-01 ENCOUNTER — Other Ambulatory Visit: Payer: Self-pay

## 2019-04-01 ENCOUNTER — Inpatient Hospital Stay: Payer: Self-pay

## 2019-04-01 ENCOUNTER — Inpatient Hospital Stay
Admission: EM | Admit: 2019-04-01 | Discharge: 2019-04-09 | DRG: 291 | Disposition: A | Discharge status: Home / Self Care | Payer: Self-pay | Attending: Internal Medicine | Admitting: Internal Medicine

## 2019-04-01 ENCOUNTER — Encounter: Payer: Self-pay | Admitting: Emergency Medicine

## 2019-04-01 DIAGNOSIS — Z9119 Patient's noncompliance with other medical treatment and regimen: Secondary | ICD-10-CM

## 2019-04-01 DIAGNOSIS — I483 Typical atrial flutter: Secondary | ICD-10-CM

## 2019-04-01 DIAGNOSIS — I130 Hypertensive heart and chronic kidney disease with heart failure and stage 1 through stage 4 chronic kidney disease, or unspecified chronic kidney disease: Principal | ICD-10-CM | POA: Diagnosis present

## 2019-04-01 DIAGNOSIS — I441 Atrioventricular block, second degree: Secondary | ICD-10-CM | POA: Diagnosis present

## 2019-04-01 DIAGNOSIS — E785 Hyperlipidemia, unspecified: Secondary | ICD-10-CM | POA: Diagnosis present

## 2019-04-01 DIAGNOSIS — N179 Acute kidney failure, unspecified: Secondary | ICD-10-CM | POA: Diagnosis present

## 2019-04-01 DIAGNOSIS — D649 Anemia, unspecified: Secondary | ICD-10-CM | POA: Diagnosis present

## 2019-04-01 DIAGNOSIS — I10 Essential (primary) hypertension: Secondary | ICD-10-CM

## 2019-04-01 DIAGNOSIS — I5043 Acute on chronic combined systolic (congestive) and diastolic (congestive) heart failure: Secondary | ICD-10-CM | POA: Diagnosis present

## 2019-04-01 DIAGNOSIS — F17210 Nicotine dependence, cigarettes, uncomplicated: Secondary | ICD-10-CM | POA: Diagnosis present

## 2019-04-01 DIAGNOSIS — Z20828 Contact with and (suspected) exposure to other viral communicable diseases: Secondary | ICD-10-CM | POA: Diagnosis present

## 2019-04-01 DIAGNOSIS — Z7982 Long term (current) use of aspirin: Secondary | ICD-10-CM

## 2019-04-01 DIAGNOSIS — R51 Headache: Secondary | ICD-10-CM | POA: Diagnosis not present

## 2019-04-01 DIAGNOSIS — Z6836 Body mass index (BMI) 36.0-36.9, adult: Secondary | ICD-10-CM

## 2019-04-01 DIAGNOSIS — I4819 Other persistent atrial fibrillation: Secondary | ICD-10-CM | POA: Diagnosis present

## 2019-04-01 DIAGNOSIS — N183 Chronic kidney disease, stage 3 (moderate): Secondary | ICD-10-CM | POA: Diagnosis present

## 2019-04-01 DIAGNOSIS — I471 Supraventricular tachycardia: Secondary | ICD-10-CM | POA: Diagnosis present

## 2019-04-01 DIAGNOSIS — J189 Pneumonia, unspecified organism: Secondary | ICD-10-CM | POA: Diagnosis present

## 2019-04-01 DIAGNOSIS — E876 Hypokalemia: Secondary | ICD-10-CM | POA: Diagnosis present

## 2019-04-01 DIAGNOSIS — J9601 Acute respiratory failure with hypoxia: Secondary | ICD-10-CM

## 2019-04-01 DIAGNOSIS — Z79899 Other long term (current) drug therapy: Secondary | ICD-10-CM

## 2019-04-01 DIAGNOSIS — R0603 Acute respiratory distress: Secondary | ICD-10-CM | POA: Diagnosis present

## 2019-04-01 DIAGNOSIS — I429 Cardiomyopathy, unspecified: Secondary | ICD-10-CM | POA: Diagnosis present

## 2019-04-01 DIAGNOSIS — I4892 Unspecified atrial flutter: Secondary | ICD-10-CM | POA: Diagnosis present

## 2019-04-01 DIAGNOSIS — F1410 Cocaine abuse, uncomplicated: Secondary | ICD-10-CM | POA: Diagnosis present

## 2019-04-01 DIAGNOSIS — Z8249 Family history of ischemic heart disease and other diseases of the circulatory system: Secondary | ICD-10-CM

## 2019-04-01 DIAGNOSIS — R778 Other specified abnormalities of plasma proteins: Secondary | ICD-10-CM

## 2019-04-01 DIAGNOSIS — J9600 Acute respiratory failure, unspecified whether with hypoxia or hypercapnia: Secondary | ICD-10-CM | POA: Diagnosis present

## 2019-04-01 DIAGNOSIS — R7989 Other specified abnormal findings of blood chemistry: Secondary | ICD-10-CM

## 2019-04-01 DIAGNOSIS — J810 Acute pulmonary edema: Secondary | ICD-10-CM

## 2019-04-01 DIAGNOSIS — Z9114 Patient's other noncompliance with medication regimen: Secondary | ICD-10-CM

## 2019-04-01 DIAGNOSIS — I5021 Acute systolic (congestive) heart failure: Secondary | ICD-10-CM

## 2019-04-01 DIAGNOSIS — J9690 Respiratory failure, unspecified, unspecified whether with hypoxia or hypercapnia: Secondary | ICD-10-CM

## 2019-04-01 HISTORY — DX: Other psychoactive substance abuse, uncomplicated: F19.10

## 2019-04-01 LAB — URINE DRUG SCREEN, QUALITATIVE (ARMC ONLY)
Amphetamines, Ur Screen: NOT DETECTED
Barbiturates, Ur Screen: NOT DETECTED
Benzodiazepine, Ur Scrn: NOT DETECTED
Cannabinoid 50 Ng, Ur ~~LOC~~: POSITIVE — AB
Cocaine Metabolite,Ur ~~LOC~~: NOT DETECTED
MDMA (Ecstasy)Ur Screen: NOT DETECTED
Methadone Scn, Ur: NOT DETECTED
Opiate, Ur Screen: NOT DETECTED
Phencyclidine (PCP) Ur S: NOT DETECTED
Tricyclic, Ur Screen: NOT DETECTED

## 2019-04-01 LAB — COMPREHENSIVE METABOLIC PANEL
ALT: 59 U/L — ABNORMAL HIGH (ref 0–44)
AST: 28 U/L (ref 15–41)
Albumin: 3.1 g/dL — ABNORMAL LOW (ref 3.5–5.0)
Alkaline Phosphatase: 106 U/L (ref 38–126)
Anion gap: 10 (ref 5–15)
BUN: 24 mg/dL — ABNORMAL HIGH (ref 6–20)
CO2: 25 mmol/L (ref 22–32)
Calcium: 8.6 mg/dL — ABNORMAL LOW (ref 8.9–10.3)
Chloride: 104 mmol/L (ref 98–111)
Creatinine, Ser: 1.64 mg/dL — ABNORMAL HIGH (ref 0.61–1.24)
GFR calc Af Amer: >60 mL/min (ref >60)
GFR calc non Af Amer: 52 mL/min — ABNORMAL LOW (ref >60)
Glucose, Bld: 117 mg/dL — ABNORMAL HIGH (ref 70–99)
Potassium: 2.6 mmol/L — CL (ref 3.5–5.1)
Sodium: 139 mmol/L (ref 135–145)
Total Bilirubin: 1 mg/dL (ref 0.3–1.2)
Total Protein: 6.7 g/dL (ref 6.5–8.1)

## 2019-04-01 LAB — CBC WITH DIFFERENTIAL/PLATELET
Abs Immature Granulocytes: 0.03 10*3/uL (ref 0.00–0.07)
Basophils Absolute: 0.1 10*3/uL (ref 0.0–0.1)
Basophils Relative: 1 %
Eosinophils Absolute: 0.2 10*3/uL (ref 0.0–0.5)
Eosinophils Relative: 2 %
HCT: 35.3 % — ABNORMAL LOW (ref 39.0–52.0)
Hemoglobin: 10.9 g/dL — ABNORMAL LOW (ref 13.0–17.0)
Immature Granulocytes: 0 %
Lymphocytes Relative: 23 %
Lymphs Abs: 1.9 10*3/uL (ref 0.7–4.0)
MCH: 20.7 pg — ABNORMAL LOW (ref 26.0–34.0)
MCHC: 30.9 g/dL (ref 30.0–36.0)
MCV: 67 fL — ABNORMAL LOW (ref 80.0–100.0)
Monocytes Absolute: 0.6 10*3/uL (ref 0.1–1.0)
Monocytes Relative: 7 %
Neutro Abs: 5.6 10*3/uL (ref 1.7–7.7)
Neutrophils Relative %: 67 %
Platelets: 323 10*3/uL (ref 150–400)
RBC: 5.27 MIL/uL (ref 4.22–5.81)
RDW: 17.2 % — ABNORMAL HIGH (ref 11.5–15.5)
Smear Review: NORMAL
WBC: 8.3 10*3/uL (ref 4.0–10.5)
nRBC: 0 % (ref 0.0–0.2)

## 2019-04-01 LAB — BASIC METABOLIC PANEL
Anion gap: 11 (ref 5–15)
BUN: 28 mg/dL — ABNORMAL HIGH (ref 6–20)
CO2: 24 mmol/L (ref 22–32)
Calcium: 8.4 mg/dL — ABNORMAL LOW (ref 8.9–10.3)
Chloride: 102 mmol/L (ref 98–111)
Creatinine, Ser: 1.53 mg/dL — ABNORMAL HIGH (ref 0.61–1.24)
GFR calc Af Amer: >60 mL/min (ref >60)
GFR calc non Af Amer: 57 mL/min — ABNORMAL LOW (ref >60)
Glucose, Bld: 110 mg/dL — ABNORMAL HIGH (ref 70–99)
Potassium: 3.3 mmol/L — ABNORMAL LOW (ref 3.5–5.1)
Sodium: 137 mmol/L (ref 135–145)

## 2019-04-01 LAB — PROTIME-INR
INR: 1.2 (ref 0.8–1.2)
Prothrombin Time: 14.6 seconds (ref 11.4–15.2)

## 2019-04-01 LAB — SARS CORONAVIRUS 2 BY RT PCR (HOSPITAL ORDER, PERFORMED IN ~~LOC~~ HOSPITAL LAB)
SARS Coronavirus 2: NEGATIVE
SARS Coronavirus 2: NEGATIVE

## 2019-04-01 LAB — ABO/RH: ABO/RH(D): O POS

## 2019-04-01 LAB — TROPONIN I (HIGH SENSITIVITY)
Troponin I (High Sensitivity): 298 ng/L (ref <18)
Troponin I (High Sensitivity): 298 ng/L (ref <18)

## 2019-04-01 LAB — HEMOGLOBIN A1C
Hgb A1c MFr Bld: 5.8 % — ABNORMAL HIGH (ref 4.8–5.6)
Mean Plasma Glucose: 119.76 mg/dL

## 2019-04-01 LAB — LACTIC ACID, PLASMA
Lactic Acid, Venous: 0.9 mmol/L (ref 0.5–1.9)
Lactic Acid, Venous: 0.9 mmol/L (ref 0.5–1.9)

## 2019-04-01 LAB — PROCALCITONIN
Procalcitonin: <0.1 ng/mL
Procalcitonin: <0.1 ng/mL

## 2019-04-01 LAB — MAGNESIUM
Magnesium: 2 mg/dL (ref 1.7–2.4)
Magnesium: 2 mg/dL (ref 1.7–2.4)

## 2019-04-01 LAB — PHOSPHORUS: Phosphorus: 3.7 mg/dL (ref 2.5–4.6)

## 2019-04-01 LAB — BRAIN NATRIURETIC PEPTIDE: B Natriuretic Peptide: 1059 pg/mL — ABNORMAL HIGH (ref 0.0–100.0)

## 2019-04-01 LAB — TSH: TSH: 1.171 u[IU]/mL (ref 0.350–4.500)

## 2019-04-01 MED ORDER — ESMOLOL HCL-SODIUM CHLORIDE 2000 MG/100ML IV SOLN
25.0000 ug/kg/min | INTRAVENOUS | Status: DC
  Administered 2019-04-01 (×2): 50 ug/kg/min via INTRAVENOUS
  Administered 2019-04-02: 25 ug/kg/min via INTRAVENOUS
  Filled 2019-04-01 (×3): qty 100

## 2019-04-01 MED ORDER — SODIUM CHLORIDE 0.9% FLUSH
10.0000 mL | Freq: Two times a day (BID) | INTRAVENOUS | Status: DC
  Administered 2019-04-01: 20 mL
  Administered 2019-04-01 – 2019-04-05 (×9): 10 mL
  Administered 2019-04-05 – 2019-04-06 (×2): 30 mL
  Administered 2019-04-06: 10 mL
  Administered 2019-04-07: 21:00:00 3 mL
  Administered 2019-04-08 (×2): 10 mL

## 2019-04-01 MED ORDER — HEPARIN (PORCINE) 25000 UT/250ML-% IV SOLN
1950.0000 [IU]/h | INTRAVENOUS | Status: DC
  Administered 2019-04-02: 1700 [IU]/h via INTRAVENOUS
  Filled 2019-04-01: qty 250

## 2019-04-01 MED ORDER — ACETAMINOPHEN 325 MG PO TABS
650.0000 mg | ORAL_TABLET | Freq: Four times a day (QID) | ORAL | Status: DC | PRN
  Administered 2019-04-02 – 2019-04-09 (×7): 650 mg via ORAL
  Filled 2019-04-01 (×7): qty 2

## 2019-04-01 MED ORDER — SODIUM CHLORIDE 0.9% FLUSH: 10.0000 mL | INTRAVENOUS | Status: DC | PRN

## 2019-04-01 MED ORDER — DIAZEPAM 5 MG PO TABS
5.0000 mg | ORAL_TABLET | Freq: Once | ORAL | Status: AC
  Administered 2019-04-01: 5 mg via ORAL
  Filled 2019-04-01: qty 1

## 2019-04-01 MED ORDER — POTASSIUM CHLORIDE 10 MEQ/100ML IV SOLN
10.0000 meq | INTRAVENOUS | Status: DC
  Filled 2019-04-01: qty 100

## 2019-04-01 MED ORDER — POTASSIUM CHLORIDE CRYS ER 20 MEQ PO TBCR
40.0000 meq | EXTENDED_RELEASE_TABLET | Freq: Four times a day (QID) | ORAL | Status: AC
  Administered 2019-04-01 (×2): 40 meq via ORAL
  Filled 2019-04-01 (×2): qty 2

## 2019-04-01 MED ORDER — DILTIAZEM HCL 25 MG/5ML IV SOLN
20.0000 mg | INTRAVENOUS | Status: AC
  Administered 2019-04-01: 07:00:00 20 mg via INTRAVENOUS

## 2019-04-01 MED ORDER — ATORVASTATIN CALCIUM 20 MG PO TABS
40.0000 mg | ORAL_TABLET | Freq: Every day | ORAL | Status: DC
  Administered 2019-04-01 – 2019-04-08 (×8): 40 mg via ORAL
  Filled 2019-04-01 (×8): qty 2

## 2019-04-01 MED ORDER — KETOROLAC TROMETHAMINE 15 MG/ML IJ SOLN
15.0000 mg | Freq: Once | INTRAMUSCULAR | Status: AC
  Administered 2019-04-01: 15:00:00 15 mg via INTRAVENOUS
  Filled 2019-04-01: qty 1

## 2019-04-01 MED ORDER — ENOXAPARIN SODIUM 150 MG/ML ~~LOC~~ SOLN: 140.0000 mg | Freq: Two times a day (BID) | SUBCUTANEOUS | Status: DC

## 2019-04-01 MED ORDER — HEPARIN (PORCINE) 25000 UT/250ML-% IV SOLN
1400.0000 [IU]/h | INTRAVENOUS | Status: DC
  Administered 2019-04-01: 1400 [IU]/h via INTRAVENOUS
  Filled 2019-04-01: qty 250

## 2019-04-01 MED ORDER — FUROSEMIDE 10 MG/ML IJ SOLN
40.0000 mg | Freq: Two times a day (BID) | INTRAMUSCULAR | Status: DC
  Administered 2019-04-01 – 2019-04-03 (×5): 40 mg via INTRAVENOUS
  Filled 2019-04-01 (×5): qty 4

## 2019-04-01 MED ORDER — DIGOXIN 0.25 MG/ML IJ SOLN
0.5000 mg | Freq: Once | INTRAMUSCULAR | Status: AC
  Administered 2019-04-01: 13:00:00 0.5 mg via INTRAVENOUS
  Filled 2019-04-01: qty 2

## 2019-04-01 MED ORDER — DILTIAZEM HCL 25 MG/5ML IV SOLN
15.0000 mg | INTRAVENOUS | Status: AC
  Administered 2019-04-01: 15 mg via INTRAVENOUS
  Filled 2019-04-01: qty 5

## 2019-04-01 MED ORDER — ASPIRIN EC 81 MG PO TBEC
81.0000 mg | DELAYED_RELEASE_TABLET | Freq: Every day | ORAL | Status: DC
  Administered 2019-04-01 – 2019-04-09 (×9): 81 mg via ORAL
  Filled 2019-04-01 (×9): qty 1

## 2019-04-01 MED ORDER — METOPROLOL TARTRATE 5 MG/5ML IV SOLN
5.0000 mg | Freq: Once | INTRAVENOUS | Status: AC
  Administered 2019-04-01: 12:00:00 5 mg via INTRAVENOUS

## 2019-04-01 MED ORDER — POTASSIUM CHLORIDE CRYS ER 20 MEQ PO TBCR
40.0000 meq | EXTENDED_RELEASE_TABLET | Freq: Once | ORAL | Status: AC
  Administered 2019-04-01: 40 meq via ORAL
  Filled 2019-04-01: qty 2

## 2019-04-01 MED ORDER — ACETAMINOPHEN 650 MG RE SUPP: 650.0000 mg | Freq: Four times a day (QID) | RECTAL | Status: DC | PRN

## 2019-04-01 MED ORDER — METOPROLOL TARTRATE 5 MG/5ML IV SOLN
INTRAVENOUS | Status: AC
  Administered 2019-04-01: 5 mg via INTRAVENOUS
  Filled 2019-04-01: qty 5

## 2019-04-01 MED ORDER — ONDANSETRON HCL 4 MG PO TABS: 4.0000 mg | ORAL_TABLET | Freq: Four times a day (QID) | ORAL | Status: DC | PRN

## 2019-04-01 MED ORDER — HEPARIN BOLUS VIA INFUSION
4000.0000 [IU] | Freq: Once | INTRAVENOUS | Status: DC
  Administered 2019-04-01: 12:00:00 4000 [IU] via INTRAVENOUS
  Filled 2019-04-01: qty 4000

## 2019-04-01 MED ORDER — ASPIRIN EC 325 MG PO TBEC
325.0000 mg | DELAYED_RELEASE_TABLET | Freq: Every day | ORAL | Status: DC
  Filled 2019-04-01: qty 1

## 2019-04-01 MED ORDER — DILTIAZEM HCL 100 MG IV SOLR
5.0000 mg/h | INTRAVENOUS | Status: DC
  Administered 2019-04-01 (×2): 15 mg/h via INTRAVENOUS
  Administered 2019-04-01: 07:00:00 5 mg/h via INTRAVENOUS
  Administered 2019-04-02 (×2): 15 mg/h via INTRAVENOUS
  Filled 2019-04-01 (×4): qty 100

## 2019-04-01 MED ORDER — DILTIAZEM HCL 100 MG IV SOLR
INTRAVENOUS | Status: AC
  Administered 2019-04-01: 5 mg/h via INTRAVENOUS
  Filled 2019-04-01: qty 100

## 2019-04-01 MED ORDER — ENOXAPARIN SODIUM 150 MG/ML ~~LOC~~ SOLN
140.0000 mg | Freq: Once | SUBCUTANEOUS | Status: DC
  Filled 2019-04-01: qty 0.93

## 2019-04-01 MED ORDER — SODIUM CHLORIDE 0.9 % IV BOLUS (SEPSIS): 1000.0000 mL | Freq: Once | INTRAVENOUS | Status: DC

## 2019-04-01 MED ORDER — METOPROLOL TARTRATE 5 MG/5ML IV SOLN
5.0000 mg | Freq: Once | INTRAVENOUS | Status: AC
  Administered 2019-04-01: 5 mg via INTRAVENOUS
  Filled 2019-04-01: qty 5

## 2019-04-01 MED ORDER — DILTIAZEM LOAD VIA INFUSION
10.0000 mg | Freq: Once | INTRAVENOUS | Status: AC
  Administered 2019-04-01: 10 mg via INTRAVENOUS
  Filled 2019-04-01: qty 10

## 2019-04-01 MED ORDER — HYDRALAZINE HCL 50 MG PO TABS
100.0000 mg | ORAL_TABLET | Freq: Three times a day (TID) | ORAL | Status: DC
  Administered 2019-04-01 – 2019-04-09 (×22): 100 mg via ORAL
  Filled 2019-04-01 (×23): qty 2

## 2019-04-01 MED ORDER — NITROGLYCERIN IN D5W 200-5 MCG/ML-% IV SOLN
INTRAVENOUS | Status: AC
  Filled 2019-04-01: qty 250

## 2019-04-01 MED ORDER — SODIUM CHLORIDE 0.9 % IV SOLN
2.0000 g | INTRAVENOUS | Status: DC
  Administered 2019-04-01: 2 g via INTRAVENOUS
  Filled 2019-04-01: qty 20

## 2019-04-01 MED ORDER — SODIUM CHLORIDE 0.9 % IV SOLN
500.0000 mg | INTRAVENOUS | Status: DC
  Administered 2019-04-01: 500 mg via INTRAVENOUS
  Filled 2019-04-01: qty 500

## 2019-04-01 MED ORDER — SODIUM CHLORIDE 0.9 % IV SOLN
1.0000 g | INTRAVENOUS | Status: DC
  Administered 2019-04-02: 1 g via INTRAVENOUS
  Filled 2019-04-01: qty 1

## 2019-04-01 MED ORDER — POTASSIUM CHLORIDE 10 MEQ/100ML IV SOLN
10.0000 meq | INTRAVENOUS | Status: AC
  Filled 2019-04-01 (×4): qty 100

## 2019-04-01 MED ORDER — SODIUM CHLORIDE 0.9% FLUSH: 3.0000 mL | Freq: Two times a day (BID) | INTRAVENOUS | Status: DC

## 2019-04-01 MED ORDER — ISOSORBIDE MONONITRATE ER 30 MG PO TB24
30.0000 mg | ORAL_TABLET | Freq: Every day | ORAL | Status: DC
  Administered 2019-04-01 – 2019-04-08 (×8): 30 mg via ORAL
  Filled 2019-04-01 (×8): qty 1

## 2019-04-01 MED ORDER — SODIUM CHLORIDE 0.9% FLUSH: 3.0000 mL | INTRAVENOUS | Status: DC | PRN

## 2019-04-01 MED ORDER — NITROGLYCERIN IN D5W 200-5 MCG/ML-% IV SOLN: 0.0000 ug/min | INTRAVENOUS | Status: DC

## 2019-04-01 MED ORDER — LORAZEPAM 2 MG/ML IJ SOLN: 1.0000 mg | Freq: Once | INTRAMUSCULAR | Status: DC

## 2019-04-01 MED ORDER — SODIUM CHLORIDE 0.9 % IV SOLN: 250.0000 mL | INTRAVENOUS | Status: DC | PRN

## 2019-04-01 MED ORDER — ENOXAPARIN SODIUM 150 MG/ML ~~LOC~~ SOLN
130.0000 mg | Freq: Once | SUBCUTANEOUS | Status: DC
  Filled 2019-04-01: qty 0.87

## 2019-04-01 MED ORDER — CLONIDINE HCL 0.1 MG PO TABS
0.2000 mg | ORAL_TABLET | Freq: Two times a day (BID) | ORAL | Status: DC
  Administered 2019-04-01 – 2019-04-03 (×5): 0.2 mg via ORAL
  Filled 2019-04-01 (×5): qty 2

## 2019-04-01 MED ORDER — SODIUM CHLORIDE 0.9 % IV SOLN
500.0000 mg | INTRAVENOUS | Status: DC
  Administered 2019-04-02: 500 mg via INTRAVENOUS
  Filled 2019-04-01: qty 500

## 2019-04-01 MED ORDER — POTASSIUM CHLORIDE 10 MEQ/100ML IV SOLN
10.0000 meq | INTRAVENOUS | Status: AC
  Administered 2019-04-01 (×2): 10 meq via INTRAVENOUS
  Filled 2019-04-01 (×2): qty 100

## 2019-04-01 MED ORDER — ONDANSETRON HCL 4 MG/2ML IJ SOLN: 4.0000 mg | Freq: Four times a day (QID) | INTRAMUSCULAR | Status: DC | PRN

## 2019-04-01 MED ORDER — DILTIAZEM HCL 25 MG/5ML IV SOLN
INTRAVENOUS | Status: AC
  Administered 2019-04-01: 07:00:00 20 mg via INTRAVENOUS
  Filled 2019-04-01: qty 5

## 2019-04-01 NOTE — Progress Notes (Signed)
Transported to ICU on bipap without complications.Report given to ICU RT

## 2019-04-01 NOTE — ED Provider Notes (Signed)
[facility] Emergency Department Provider Note  ____________________________________________   None    (approximate)  I have reviewed the triage vital signs and the nursing notes.   HISTORY  Chief Complaint Shortness of Breath    HPI Gerald [name] is a 38 y.o. male with medical history as listed below who presents for evaluation of worsening shortness of breath and cough over the last 2 days.  Exertion makes his symptoms a lot worse but they are at least mild at baseline.  He has not had any fever or chills of which she is aware.  He has had a cough that is productive of sputum that is occasionally flecked with blood.  He had an episode of dull chest pain yesterday morning (not quite 24 hours ago) that was relatively brief and then resolved.  He has not been around anyone known to have COVID-19 but he does live in a boardinghouse and does not know if anyone around him has been tested or if they are positive.  He denies sore throat, loss of smell or taste, nausea, vomiting, abdominal pain, and dysuria.  He describes his shortness of breath is severe.  He has also been noncompliant with his blood pressure medicine for months.  Overall he describes his symptoms as severe.         Past Medical History:  Diagnosis Date   Hypertension     Patient Active Problem List   Diagnosis Date Noted   NSTEMI (non-ST elevated myocardial infarction) ([place]) 12/21/2015   Community acquired pneumonia 12/21/2015    Past Surgical History:  Procedure Laterality Date   KNEE SURGERY Right     Prior to Admission medications   Medication Sig Start Date End Date Taking? Authorizing Provider  aspirin EC 81 MG EC tablet Take 1 tablet (81 mg total) by mouth daily. 12/23/15   Demetrios Loll, MD  atorvastatin (LIPITOR) 40 MG tablet Take 1 tablet (40 mg total) by mouth daily at 6 PM. 06/27/18   Lavonia Drafts, MD  cloNIDine (CATAPRES) 0.2 MG tablet Take 1 tablet (0.2 mg total) by  mouth 2 (two) times daily. 06/27/18   Lavonia Drafts, MD  hydrALAZINE (APRESOLINE) 100 MG tablet Take 1 tablet (100 mg total) by mouth every 8 (eight) hours. 06/27/18   Lavonia Drafts, MD  isosorbide mononitrate (IMDUR) 30 MG 24 hr tablet Take 1 tablet (30 mg total) by mouth daily. 06/27/18   Lavonia Drafts, MD    Allergies Patient has no known allergies.  Family History  Problem Relation Age of Onset   Hypertension Father     Social History Social History   Tobacco Use   Smoking status: Current Every Day Smoker    Packs/day: 1.00    Types: Cigarettes   Smokeless tobacco: Never Used  Substance Use Topics   Alcohol use: Yes    Comment: occas.    Drug use: Yes    Types: Cocaine    Review of Systems Constitutional: No fever/chills of which he was aware. Eyes: No visual changes. ENT: No sore throat. Cardiovascular: No chest pain about 24 hours ago Respiratory: Shortness of breath and productive cough flecked with blood for about 2 days Gastrointestinal: No abdominal pain.  No nausea, no vomiting.  No diarrhea.  No constipation. Genitourinary: Negative for dysuria. Musculoskeletal: Negative for neck pain.  Negative for back pain. Integumentary: Negative for rash. Neurological: Negative for headaches, focal weakness or numbness.   ____________________________________________   PHYSICAL EXAM:  VITAL SIGNS:  ED Triage Vitals [04/01/19 0537]  Enc Vitals Group     BP (!) 191/147     Pulse Rate 73     Resp 20     Temp (!) 100.6 F (38.1 C)     Temp Source Oral     SpO2 94 %     Weight      Height      Head Circumference      Peak Flow      Pain Score      Pain Loc      Pain Edu?      Excl. in [place]?     Constitutional: Alert and oriented.  Nontoxic appearance but does appear uncomfortable. Eyes: Conjunctivae are normal.  Head: Atraumatic. Nose: No congestion/rhinnorhea. Mouth/Throat: Mucous membranes are moist. Neck: No stridor.  No meningeal signs.     Cardiovascular: Tachycardia at 151, regular rhythm. Good peripheral circulation. Grossly normal heart sounds. Respiratory: Tachypnea, some coarse breath sounds, no retractions or accessory muscle usage. Gastrointestinal: Soft and nontender. No distention.  Musculoskeletal: Trace pitting edema in bilateral lower extremities. No gross deformities of extremities. Neurologic:  Normal speech and language. No gross focal neurologic deficits are appreciated.  Skin:  Skin is warm, dry and intact. Psychiatric: Mood and affect are normal. Speech and behavior are normal.  ____________________________________________   LABS (all labs ordered are listed, but only abnormal results are displayed)  Labs Reviewed  CBC WITH DIFFERENTIAL/PLATELET - Abnormal; Notable for the following components:      Result Value   Hemoglobin 10.9 (*)    HCT 35.3 (*)    MCV 67.0 (*)    MCH 20.7 (*)    RDW 17.2 (*)    All other components within normal limits  COMPREHENSIVE METABOLIC PANEL - Abnormal; Notable for the following components:   Potassium 2.6 (*)    Glucose, Bld 117 (*)    BUN 24 (*)    Creatinine, Ser 1.64 (*)    Calcium 8.6 (*)    Albumin 3.1 (*)    ALT 59 (*)    GFR calc non Af Amer 52 (*)    All other components within normal limits  TROPONIN I (HIGH SENSITIVITY) - Abnormal; Notable for the following components:   Troponin I (High Sensitivity) 298 (*)    All other components within normal limits  SARS CORONAVIRUS 2 (HOSPITAL ORDER, [facility])  CULTURE, BLOOD (ROUTINE X 2)  CULTURE, BLOOD (ROUTINE X 2)  LACTIC ACID, PLASMA  PROTIME-INR  MAGNESIUM  BRAIN NATRIURETIC PEPTIDE  LACTIC ACID, PLASMA  PROCALCITONIN  URINE DRUG SCREEN, QUALITATIVE ([facility] ONLY)   ____________________________________________  EKG  ED ECG REPORT I, Hinda Kehr, the attending physician, personally viewed and interpreted this ECG.  Date: 04/01/2019 EKG Time: 5:44 AM Rate:  151 Rhythm: A flutter with RVR QRS Axis: normal Intervals: normal ST/T Wave abnormalities: Non-specific ST segment / T-wave changes, but no clear evidence of acute ischemia. Narrative Interpretation: no definitive evidence of acute ischemia; does not meet STEMI criteria.   ____________________________________________  RADIOLOGY I, Hinda Kehr, personally viewed and evaluated these images (plain radiographs) as part of my medical decision making, as well as reviewing the written report by the radiologist.  ED MD interpretation: Extensive bilateral opacities, asymmetrical, concerning for community-acquired pneumonia and/or CHF/pulmonary edema  Official radiology report(s): Dg Chest Portable 1 View  Result Date: 04/01/2019 CLINICAL DATA:  Cough and shortness of breath EXAM: PORTABLE CHEST 1 VIEW COMPARISON:  12/21/2015 FINDINGS:  Asymmetric patchy airspace opacity to the right. Cardiomegaly accentuated by low volumes. No effusion or pneumothorax. IMPRESSION: 1. Right more than left pulmonary opacity with patchy appearance favoring pneumonia. 2. Cardiomegaly that is progressed from 2017. Electronically Signed   By: Monte Fantasia M.D.   On: 04/01/2019 06:24    ____________________________________________   PROCEDURES   Procedure(s) performed (including Critical Care):  .Critical Care Performed by: Hinda Kehr, MD Authorized by: Hinda Kehr, MD   Critical care provider statement:    Critical care time (minutes):  45   Critical care time was exclusive of:  Separately billable procedures and treating other patients   Critical care was necessary to treat or prevent imminent or life-threatening deterioration of the following conditions:  Respiratory failure, sepsis and cardiac failure   Critical care was time spent personally by me on the following activities:  Development of treatment plan with patient or surrogate, discussions with consultants, evaluation of patient's response to  treatment, examination of patient, obtaining history from patient or surrogate, ordering and performing treatments and interventions, ordering and review of laboratory studies, ordering and review of radiographic studies, pulse oximetry, re-evaluation of patient's condition and review of old charts     ____________________________________________   INITIAL IMPRESSION / MDM / [place] / ED COURSE  As part of my medical decision making, I reviewed the following data within the [facility] notes reviewed and incorporated, Labs reviewed , EKG interpreted , Old chart reviewed, Patient signed out to Dr. Jacqualine Code, Radiograph reviewed  and Notes from prior ED visits   Differential diagnosis includes, but is not limited to, COVID-19, pneumonia, PE, ACS, viral bronchitis or other respiratory infection, atrial flutter, ACS.  The patient is relatively reassuring in appearance initially but his vital signs are grossly abnormal.  His heart rate was 151 on EKG and most consistent with a flutter with RVR.  His initial triage temperature was 100.6 and his blood pressure is substantially elevated at 191/147 in the setting of noncompliance with his antihypertensives.  His oxygen saturation is in the low 90s and he does not appear to be having trouble speaking but he is tachypneic.  He lives in a group setting and I am checking a rapid coronavirus swab and ordered lab work and chest x-ray.  I will reassess at that point to determine the best next interventions.      Clinical Course as of Apr 01 707  Fri Apr 01, 2019  [id number] Patient is maintaining a heart rate of about 150 and his blood pressure is substantially elevated in the 190s/140s.  Will try diltiazem 15 mg IV to see if this improves rate.   [CF]  0627 Generally reassuring CBC, no leukocytosis  CBC with Differential/Platelet(!) [CF]  0630 Chest x-ray has extensive asymmetric patchy opacities.  Radiologist feels this is more  likely to favor pneumonia which the patient has had in the past.  He had minimal response to the diltiazem terms of heart rate.At this point I am going to call a.  Sepsis and initiate empiric antibiotics for community-acquired pneumonia.  Given my persistent concern that this could be a CHF exacerbation I am going to order only 1 L normal saline for his persistent tachycardia and suspected sepsis given that he is extremely hypertensive and I do not think that his presentation will represent severe sepsis.  COVID-19 swab is still in process.   [CF]  [id number] Lactic Acid, Venous: 0.9 [CF]  0649 Ordering 40 meq PO  Potassium(!!): 2.6 [CF]  0650 Troponin I (High Sensitivity)(!!): 298 [CF]  0650 Lactic Acid, Venous: 0.9 [CF]  0650 The patient's cough is becoming more frequent and more wet.  My concern is that he may have pneumonia but that has pushed him into a flutter which is creating pulmonary edema.  I have asked respiratory therapy to start the patient on BiPAP and given no response to the initial bolus of diltiazem, I have ordered a second bolus of 20 mg IV and then started a diltiazem infusion.  My goal is to control the rate as well as to bring down his blood pressure safely to see if this reduces his preload and improves the breathing.  I considered nitroglycerin or even a combination of esmolol and nitroglycerin infusions, but I am concerned that titrating the medication too difficult and if the BiPAP and diltiazem drips are sufficient this may be safer for the patient then adding another agent.   [CF]  0706 Transferring ED care to Dr. Jacqualine Code for disposition after coronavirus swab   [CF]    Clinical Course User Index [CF] Hinda Kehr, MD     ____________________________________________  FINAL CLINICAL IMPRESSION(S) / ED DIAGNOSES  Final diagnoses:  Acute respiratory failure, unspecified whether with hypoxia or hypercapnia ([place])  Community acquired pneumonia, unspecified laterality   Hypokalemia  Malignant hypertension  Elevated troponin     MEDICATIONS GIVEN DURING THIS VISIT:  Medications  cefTRIAXone (ROCEPHIN) 2 g in sodium chloride 0.9 % 100 mL IVPB (2 g Intravenous New Bag/Given 04/01/19 0653)  azithromycin (ZITHROMAX) 500 mg in sodium chloride 0.9 % 250 mL IVPB (has no administration in time range)  diltiazem (CARDIZEM) 100 mg in dextrose 5 % 100 mL (1 mg/mL) infusion (5 mg/hr Intravenous New Bag/Given 04/01/19 0659)  potassium chloride SA (K-DUR) CR tablet 40 mEq (has no administration in time range)  diltiazem (CARDIZEM) injection 15 mg (15 mg Intravenous Given 04/01/19 0625)  diltiazem (CARDIZEM) injection 20 mg (20 mg Intravenous Given 04/01/19 [id number])     ED Discharge Orders    None      *Please note:  KAISYN HERNANDO [name] was evaluated in Emergency Department on 04/01/2019 for the symptoms described in the history of present illness. He was evaluated in the context of the global COVID-19 pandemic, which necessitated consideration that the patient might be at risk for infection with the SARS-CoV-2 virus that causes COVID-19. Institutional protocols and algorithms that pertain to the evaluation of patients at risk for COVID-19 are in a state of rapid change based on information released by regulatory bodies including the CDC and federal and state organizations. These policies and algorithms were followed during the patient's care in the ED.  Some ED evaluations and interventions may be delayed as a result of limited staffing during the pandemic.*  Note:  This document was prepared using Dragon voice recognition software and may include unintentional dictation errors.   Hinda Kehr, MD 04/01/19 [phone]

## 2019-04-01 NOTE — ED Notes (Signed)
RT at bedside to setup bipap; pt voices good understanding of procedure

## 2019-04-01 NOTE — ED Notes (Signed)
Pt uprite on stretcher in exam room with no distress noted; assisted into hosp gown & on card monitor; Dr Karma Greaser at bedside; pt reports SHOB x 2 days, esp on exertion; swelling to LE(s) and noted prod cough of bloody sputum this am (pt is resident in boarding house); has been off meds for last 40mos due to financial reasons; pt also reports has had some dull upper CP, +smoker and hx CHF; resp even/unlab, lungs clear, apical audible & regular, +BS, abd soft/nondist

## 2019-04-01 NOTE — ED Notes (Signed)
ED TO INPATIENT HANDOFF REPORT  ED Nurse Name and Phone #: [name] 14   S Name/Age/Gender Gerald [name] 38 y.o. male Room/Bed: ED03A/ED03A  Code Status   Code Status: Full Code  Home/SNF/Other Home Patient oriented to: self, place, time and situation Is this baseline? Yes   Triage Complete: Triage complete  Chief Complaint Cough Diff Breathing  Triage Note Pt ambulatory from cab into [place] with steady gait, no distress noted. Pt reports he has had HA, cough and SOB x2 days with SOB increasing in last day.    Allergies No Known Allergies  Level of Care/Admitting Diagnosis ED Disposition    ED Disposition Condition [facility] Area: [place] [[id number]]  Level of Care: Stepdown [14]  Covid Evaluation: Person Under Investigation (PUI)  Diagnosis: Acute respiratory failure ([place]) [518.81.ICD-9-CM]  Admitting Physician: Dustin Flock [id number]  Attending Physician: Dustin Flock [id number]  Estimated length of stay: past midnight tomorrow  Certification:: I certify this patient will need inpatient services for at least 2 midnights  PT Class (Do Not Modify): Inpatient [101]  PT Acc Code (Do Not Modify): Private [1]       B Medical/Surgery History Past Medical History:  Diagnosis Date  . Hypertension   . Polysubstance abuse [facility])    Past Surgical History:  Procedure Laterality Date  . KNEE SURGERY Right      A IV Location/Drains/Wounds Patient Lines/Drains/Airways Status   Active Line/Drains/Airways    Name:   Placement date:   Placement time:   Site:   Days:   Peripheral IV 04/01/19 Left Antecubital   04/01/19    0610    Antecubital   less than 1   Peripheral IV 04/01/19 Left Hand   04/01/19    0624    Hand   less than 1          Intake/Output Last 24 hours  Intake/Output Summary (Last 24 hours) at 04/01/2019 1105 Last data filed at 04/01/2019 [id number] Gross per 24 hour  Intake 356.46 ml  Output -  Net 356.46 ml     Labs/Imaging Results for orders placed or performed during the hospital encounter of 04/01/19 (from the past 48 hour(s))  SARS Coronavirus 2 [facility] order, Performed in [facility] hospital lab) Nasopharyngeal Nasopharyngeal Swab     Status: None   Collection Time: 04/01/19  6:02 AM   Specimen: Nasopharyngeal Swab  Result Value Ref Range   SARS Coronavirus 2 NEGATIVE NEGATIVE    Comment: (NOTE) If result is NEGATIVE SARS-CoV-2 target nucleic acids are NOT DETECTED. The SARS-CoV-2 RNA is generally detectable in upper and lower  respiratory specimens during the acute phase of infection. The lowest  concentration of SARS-CoV-2 viral copies this assay can detect is 250  copies / mL. A negative result does not preclude SARS-CoV-2 infection  and should not be used as the sole basis for treatment or other  patient management decisions.  A negative result may occur with  improper specimen collection / handling, submission of specimen other  than nasopharyngeal swab, presence of viral mutation(s) within the  areas targeted by this assay, and inadequate number of viral copies  (<250 copies / mL). A negative result must be combined with clinical  observations, patient history, and epidemiological information. If result is POSITIVE SARS-CoV-2 target nucleic acids are DETECTED. The SARS-CoV-2 RNA is generally detectable in upper and lower  respiratory specimens dur ing the acute phase of infection.  Positive  results  are indicative of active infection with SARS-CoV-2.  Clinical  correlation with patient history and other diagnostic information is  necessary to determine patient infection status.  Positive results do  not rule out bacterial infection or co-infection with other viruses. If result is PRESUMPTIVE POSTIVE SARS-CoV-2 nucleic acids MAY BE PRESENT.   A presumptive positive result was obtained on the submitted specimen  and confirmed on repeat testing.  While 2019 novel  coronavirus  (SARS-CoV-2) nucleic acids may be present in the submitted sample  additional confirmatory testing may be necessary for epidemiological  and / or clinical management purposes  to differentiate between  SARS-CoV-2 and other Sarbecovirus currently known to infect humans.  If clinically indicated additional testing with an alternate test  methodology [phone]) is advised. The SARS-CoV-2 RNA is generally  detectable in upper and lower respiratory sp ecimens during the acute  phase of infection. The expected result is Negative. Fact Sheet for Patients:  StrictlyIdeas.no Fact Sheet for Healthcare Providers: BankingDealers.co.za This test is not yet approved or cleared by the [place] FDA and has been authorized for detection and/or diagnosis of SARS-CoV-2 by FDA under an Emergency Use Authorization (EUA).  This EUA will remain in effect (meaning this test can be used) for the duration of the COVID-19 declaration under Section 564(b)(1) of the Act, 21 U.S.C. section 360bbb-3(b)(1), unless the authorization is terminated or revoked sooner. Performed at [facility], [address] [place], [place] [address]   CBC with Differential/Platelet     Status: Abnormal   Collection Time: 04/01/19  6:03 AM  Result Value Ref Range   WBC 8.3 4.0 - 10.5 K/uL   RBC 5.27 4.22 - 5.81 MIL/uL   Hemoglobin 10.9 (L) 13.0 - 17.0 g/dL   HCT 35.3 (L) 39.0 - 52.0 %   MCV 67.0 (L) 80.0 - 100.0 fL   MCH 20.7 (L) 26.0 - 34.0 pg   MCHC 30.9 30.0 - 36.0 g/dL   RDW 17.2 (H) 11.5 - 15.5 %   Platelets 323 150 - 400 K/uL   nRBC 0.0 0.0 - 0.2 %   Neutrophils Relative % 67 %   Neutro Abs 5.6 1.7 - 7.7 K/uL   Lymphocytes Relative 23 %   Lymphs Abs 1.9 0.7 - 4.0 K/uL   Monocytes Relative 7 %   Monocytes Absolute 0.6 0.1 - 1.0 K/uL   Eosinophils Relative 2 %   Eosinophils Absolute 0.2 0.0 - 0.5 K/uL   Basophils Relative 1 %   Basophils  Absolute 0.1 0.0 - 0.1 K/uL   WBC Morphology MORPHOLOGY UNREMARKABLE    RBC Morphology MIXED RBC MORPHOLOGY    Smear Review Normal platelet morphology    Immature Granulocytes 0 %   Abs Immature Granulocytes 0.03 0.00 - 0.07 K/uL    Comment: Performed at [facility], [address] [place], [place] [address]  Brain natriuretic peptide     Status: Abnormal   Collection Time: 04/01/19  6:03 AM  Result Value Ref Range   B Natriuretic Peptide 1,059.0 (H) 0.0 - 100.0 pg/mL    Comment: Performed at [facility], [address] [place], [place] [address]  Comprehensive metabolic panel     Status: Abnormal   Collection Time: 04/01/19  6:03 AM  Result Value Ref Range   Sodium 139 135 - 145 mmol/L   Potassium 2.6 (LL) 3.5 - 5.1 mmol/L    Comment: CRITICAL RESULT CALLED TO, READ BACK BY AND VERIFIED WITH  LISA THOMPSON AT  [id number] 04/01/2019 SDR    Chloride 104 98 - 111 mmol/L   CO2 25 22 - 32 mmol/L   Glucose, Bld 117 (H) 70 - 99 mg/dL   BUN 24 (H) 6 - 20 mg/dL   Creatinine, Ser 1.64 (H) 0.61 - 1.24 mg/dL   Calcium 8.6 (L) 8.9 - 10.3 mg/dL   Total Protein 6.7 6.5 - 8.1 g/dL   Albumin 3.1 (L) 3.5 - 5.0 g/dL   AST 28 15 - 41 U/L   ALT 59 (H) 0 - 44 U/L   Alkaline Phosphatase 106 38 - 126 U/L   Total Bilirubin 1.0 0.3 - 1.2 mg/dL   GFR calc non Af Amer 52 (L) >60 mL/min   GFR calc Af Amer >60 >60 mL/min   Anion gap 10 5 - 15    Comment: Performed at [facility], [address] [place], [place] [address]  Troponin I (High Sensitivity)     Status: Abnormal   Collection Time: 04/01/19  6:03 AM  Result Value Ref Range   Troponin I (High Sensitivity) 298 (HH) <18 ng/L    Comment: CRITICAL RESULT CALLED TO, READ BACK BY AND VERIFIED WITH  LISA THOMPSON AT [phone] 04/01/2019 SDR (NOTE) Elevated high sensitivity troponin I (hsTnI) values and significant  changes across serial measurements may suggest ACS but many other  chronic and acute conditions are known to  elevate hsTnI results.  Refer to the "Links" section for chest pain algorithms and additional  guidance. Performed at [facility], [address] [place], [place] [address]   Lactic acid, plasma     Status: None   Collection Time: 04/01/19  6:03 AM  Result Value Ref Range   Lactic Acid, Venous 0.9 0.5 - 1.9 mmol/L    Comment: Performed at [facility], [address] [place], [place] [address]  Procalcitonin     Status: None   Collection Time: 04/01/19  6:03 AM  Result Value Ref Range   Procalcitonin <0.10 ng/mL    Comment:        Interpretation: PCT (Procalcitonin) <= 0.5 ng/mL: Systemic infection (sepsis) is not likely. Local bacterial infection is possible. (NOTE)       Sepsis PCT Algorithm           Lower Respiratory Tract                                      Infection PCT Algorithm    ----------------------------     ----------------------------         PCT < 0.25 ng/mL                PCT < 0.10 ng/mL         Strongly encourage             Strongly discourage   discontinuation of antibiotics    initiation of antibiotics    ----------------------------     -----------------------------       PCT 0.25 - 0.50 ng/mL            PCT 0.10 - 0.25 ng/mL               OR       >80% decrease in PCT            Discourage initiation of  antibiotics      Encourage discontinuation           of antibiotics    ----------------------------     -----------------------------         PCT >= 0.50 ng/mL              PCT 0.26 - 0.50 ng/mL               AND        <80% decrease in PCT             Encourage initiation of                                             antibiotics       Encourage continuation           of antibiotics    ----------------------------     -----------------------------        PCT >= 0.50 ng/mL                  PCT > 0.50 ng/mL               AND         increase in PCT                  Strongly  encourage                                      initiation of antibiotics    Strongly encourage escalation           of antibiotics                                     -----------------------------                                           PCT <= 0.25 ng/mL                                                 OR                                        > 80% decrease in PCT                                     Discontinue / Do not initiate                                             antibiotics Performed at [facility], [address] [place], [place] [address]   Protime-INR     Status: None   Collection Time: 04/01/19  6:03  AM  Result Value Ref Range   Prothrombin Time 14.6 11.4 - 15.2 seconds   INR 1.2 0.8 - 1.2    Comment: (NOTE) INR goal varies based on device and disease states. Performed at [facility], [address] [place], [place] [address]   Magnesium     Status: None   Collection Time: 04/01/19  6:03 AM  Result Value Ref Range   Magnesium 2.0 1.7 - 2.4 mg/dL    Comment: Performed at [facility], [address] [place], [place] [address]  ABO/Rh     Status: None   Collection Time: 04/01/19  6:03 AM  Result Value Ref Range   ABO/RH(D)      O POS Performed at [facility], [address] [place], [place] [address]   Blood Culture (routine x 2)     Status: None (Preliminary result)   Collection Time: 04/01/19  6:07 AM   Specimen: BLOOD  Result Value Ref Range   Specimen Description BLOOD LEFT ANTECUBITAL    Special Requests      BOTTLES DRAWN AEROBIC AND ANAEROBIC Blood Culture results may not be optimal due to an excessive volume of blood received in culture bottles   Culture      NO GROWTH <12 HOURS Performed at [facility], [address] [place], [place] [address]    Report Status PENDING   Urine Drug Screen, Qualitative ([place] only)     Status: Abnormal   Collection Time: 04/01/19  7:33 AM  Result Value Ref  Range   Tricyclic, Ur Screen NONE DETECTED NONE DETECTED   Amphetamines, Ur Screen NONE DETECTED NONE DETECTED   MDMA (Ecstasy)Ur Screen NONE DETECTED NONE DETECTED   Cocaine Metabolite,Ur [place] NONE DETECTED NONE DETECTED   Opiate, Ur Screen NONE DETECTED NONE DETECTED   Phencyclidine (PCP) Ur S NONE DETECTED NONE DETECTED   Cannabinoid 50 Ng, Ur [place] POSITIVE (A) NONE DETECTED   Barbiturates, Ur Screen NONE DETECTED NONE DETECTED   Benzodiazepine, Ur Scrn NONE DETECTED NONE DETECTED   Methadone Scn, Ur NONE DETECTED NONE DETECTED    Comment: (NOTE) Tricyclics + metabolites, urine    Cutoff 1000 ng/mL Amphetamines + metabolites, urine  Cutoff 1000 ng/mL MDMA (Ecstasy), urine              Cutoff 500 ng/mL Cocaine Metabolite, urine          Cutoff 300 ng/mL Opiate + metabolites, urine        Cutoff 300 ng/mL Phencyclidine (PCP), urine         Cutoff 25 ng/mL Cannabinoid, urine                 Cutoff 50 ng/mL Barbiturates + metabolites, urine  Cutoff 200 ng/mL Benzodiazepine, urine              Cutoff 200 ng/mL Methadone, urine                   Cutoff 300 ng/mL The urine drug screen provides only a preliminary, unconfirmed analytical test result and should not be used for non-medical purposes. Clinical consideration and professional judgment should be applied to any positive drug screen result due to possible interfering substances. A more specific alternate chemical method must be used in order to obtain a confirmed analytical result. Gas chromatography / mass spectrometry (GC/MS) is the preferred confirmat ory method. Performed at [facility], [place], [place] [address]   Lactic acid, plasma  Status: None   Collection Time: 04/01/19  8:27 AM  Result Value Ref Range   Lactic Acid, Venous 0.9 0.5 - 1.9 mmol/L    Comment: Performed at [facility], [place], [place] [address]  Troponin I (High Sensitivity)     Status: Abnormal    Collection Time: 04/01/19  8:27 AM  Result Value Ref Range   Troponin I (High Sensitivity) 298 (HH) <18 ng/L    Comment: CRITICAL VALUE NOTED. VALUE IS CONSISTENT WITH PREVIOUSLY REPORTED/CALLED VALUE FMW (NOTE) Elevated high sensitivity troponin I (hsTnI) values and significant  changes across serial measurements may suggest ACS but many other  chronic and acute conditions are known to elevate hsTnI results.  Refer to the "Links" section for chest pain algorithms and additional  guidance. Performed at [facility], [address] [place], [place] [address]    Dg Chest Portable 1 View  Result Date: 04/01/2019 CLINICAL DATA:  Cough and shortness of breath EXAM: PORTABLE CHEST 1 VIEW COMPARISON:  12/21/2015 FINDINGS: Asymmetric patchy airspace opacity to the right. Cardiomegaly accentuated by low volumes. No effusion or pneumothorax. IMPRESSION: 1. Right more than left pulmonary opacity with patchy appearance favoring pneumonia. 2. Cardiomegaly that is progressed from 2017. Electronically Signed   By: Monte Fantasia M.D.   On: 04/01/2019 06:24    Pending Labs Unresulted Labs (From admission, onward)    Start     Ordered   04/08/19 0500  Creatinine, serum  (enoxaparin (LOVENOX)    CrCl >/= 30 ml/min)  Weekly,   STAT    Comments: while on enoxaparin therapy    04/01/19 0949   04/02/19 0500  Procalcitonin - Baseline  Tomorrow morning,   STAT     04/01/19 0942   04/01/19 1011  SARS Coronavirus 2 [facility] order, Performed in [place] hospital lab) Nasopharyngeal Nasopharyngeal Swab  (Novel Coronavirus, [facility] Order))  ONCE - STAT,   STAT    Question Answer Comment  Is this test for diagnosis or screening Diagnosis of ill patient   Symptomatic for COVID-19 as defined by CDC Yes   Date of Symptom Onset 03/30/2019   Hospitalized for COVID-19 Yes   Admitted to ICU for COVID-19 No   Previously tested for COVID-19 Yes   Resident in a congregate (group) care setting No    Employed in healthcare setting No      04/01/19 1010   04/01/19 1000  Procalcitonin - Baseline  Once-Timed,   STAT     04/01/19 0836   04/01/19 0950  HIV antibody (Routine Testing)  Once,   STAT     04/01/19 0949   04/01/19 0950  CBC  (enoxaparin (LOVENOX)    CrCl >/= 30 ml/min)  Once,   STAT    Comments: Baseline for enoxaparin therapy IF NOT ALREADY DRAWN.  Notify MD if PLT < 100 K.    04/01/19 0949   04/01/19 0950  Creatinine, serum  (enoxaparin (LOVENOX)    CrCl >/= 30 ml/min)  Once,   STAT    Comments: Baseline for enoxaparin therapy IF NOT ALREADY DRAWN.    04/01/19 0949   04/01/19 0950  Culture, sputum-assessment  Once,   STAT     04/01/19 0949   04/01/19 0947  Hemoglobin A1c  Once,   STAT     04/01/19 0946   04/01/19 0838  Respiratory Panel by PCR  Add-on,   AD     04/01/19 [id number]   04/01/19 [id number]  C-reactive protein  Once,   STAT     04/01/19 0838   04/01/19 0838  Fibrin derivatives D-Dimer ([facility] only)  Once,   STAT     04/01/19 0838   04/01/19 0838  Ferritin  Once,   STAT     04/01/19 0838   04/01/19 0838  Fibrinogen  Once,   STAT     04/01/19 0838   04/01/19 0838  Lactate dehydrogenase  Once,   STAT     04/01/19 0838   04/01/19 0838  Sedimentation rate  Once,   STAT     04/01/19 0838   04/01/19 0555  Blood Culture (routine x 2)  BLOOD CULTURE X 2,   STAT     04/01/19 0554          Vitals/Pain Today's Vitals   04/01/19 0845 04/01/19 0930 04/01/19 0945 04/01/19 1000  BP: (!) 156/122 (!) 160/127 (!) 165/120 (!) 164/132  Pulse: (!) 150 (!) 150 (!) 148 (!) 149  Resp: (!) 38 (!) 32 (!) 32 20  Temp:      TempSrc:      SpO2: 98% 97% 96% 96%  Weight:      Height:        Isolation Precautions Airborne and Contact precautions  Medications Medications  diltiazem (CARDIZEM) 100 mg in dextrose 5 % 100 mL (1 mg/mL) infusion (10 mg/hr Intravenous Rate/Dose Verify 04/01/19 0817)  aspirin EC tablet 81 mg (has no administration in time range)  atorvastatin (LIPITOR)  tablet 40 mg (has no administration in time range)  cloNIDine (CATAPRES) tablet 0.2 mg (has no administration in time range)  hydrALAZINE (APRESOLINE) tablet 100 mg (has no administration in time range)  isosorbide mononitrate (IMDUR) 24 hr tablet 30 mg (has no administration in time range)  sodium chloride flush (NS) 0.9 % injection 3 mL (has no administration in time range)  sodium chloride flush (NS) 0.9 % injection 3 mL (has no administration in time range)  0.9 %  sodium chloride infusion (has no administration in time range)  acetaminophen (TYLENOL) tablet 650 mg (has no administration in time range)    Or  acetaminophen (TYLENOL) suppository 650 mg (has no administration in time range)  ondansetron (ZOFRAN) tablet 4 mg (has no administration in time range)    Or  ondansetron (ZOFRAN) injection 4 mg (has no administration in time range)  cefTRIAXone (ROCEPHIN) 1 g in sodium chloride 0.9 % 100 mL IVPB (has no administration in time range)  azithromycin (ZITHROMAX) 500 mg in sodium chloride 0.9 % 250 mL IVPB (has no administration in time range)  furosemide (LASIX) injection 40 mg (40 mg Intravenous Given 04/01/19 1038)  potassium chloride 10 mEq in 100 mL IVPB (has no administration in time range)  aspirin EC tablet 325 mg (has no administration in time range)  heparin bolus via infusion 4,000 Units (has no administration in time range)  heparin ADULT infusion 100 units/mL (25000 units/248mL sodium chloride 0.45%) (has no administration in time range)  diltiazem (CARDIZEM) injection 15 mg (15 mg Intravenous Given 04/01/19 0625)  diltiazem (CARDIZEM) injection 20 mg (20 mg Intravenous Given 04/01/19 0656)  potassium chloride SA (K-DUR) CR tablet 40 mEq (40 mEq Oral Given 04/01/19 0716)  metoprolol tartrate (LOPRESSOR) injection 5 mg (5 mg Intravenous Given 04/01/19 0827)    Mobility walks Low fall risk   Focused Assessments 1   R Recommendations: See Admitting Provider  Note  Report given to:   Additional Notes:

## 2019-04-01 NOTE — Progress Notes (Signed)
Peripherally Inserted Central Catheter/Midline Placement  The IV Nurse has discussed with the patient and/or persons authorized to consent for the patient, the purpose of this procedure and the potential benefits and risks involved with this procedure.  The benefits include less needle sticks, lab draws from the catheter, and the patient may be discharged home with the catheter. Risks include, but not limited to, infection, bleeding, blood clot (thrombus formation), and puncture of an artery; nerve damage and irregular heartbeat and possibility to perform a PICC exchange if needed/ordered by physician.  Alternatives to this procedure were also discussed.  Bard Power PICC patient education guide, fact sheet on infection prevention and patient information card has been provided to patient /or left at bedside.    PICC/Midline Placement Documentation        Darlyn Read 04/01/2019, 2:20 PM

## 2019-04-01 NOTE — ED Provider Notes (Signed)
Patient is tolerating BiPAP.  Heart rate mean remains persistently tachycardic.  Blood pressure  moderately hypertensive.  Patient's COVID test is returned negative.  I have called in and discussed admission with Dr. Tressia Miners.  Anticipate patient will be admitted to the intensive care unit or stepdown unit.   Delman Kitten, MD 04/01/19 [phone]

## 2019-04-01 NOTE — Progress Notes (Signed)
ANTICOAGULATION CONSULT NOTE - Initial Consult  Pharmacy Consult for Heparin Drip Indication: chest pain/ACS  No Known Allergies  Patient Measurements: Height: 5\' 11"  (180.3 cm) Weight: 300 lb (136.1 kg) IBW/kg (Calculated) : 75.3 Heparin Dosing Weight: 106  Vital Signs: Temp: 99 F (37.2 C) (08/14 0601) Temp Source: Oral (08/14 0601) BP: 169/132 (08/14 0830) Pulse Rate: 151 (08/14 0830)  Labs: Recent Labs    04/01/19 0603  HGB 10.9*  HCT 35.3*  PLT 323  LABPROT 14.6  INR 1.2  CREATININE 1.64*  TROPONINIHS 298*    Estimated Creatinine Clearance: 86 mL/min (A) (by C-G formula based on SCr of 1.64 mg/dL (H)).   Medical History: Past Medical History:  Diagnosis Date  . Hypertension    Assessment: Patient is a 38yo male admitted for pneumonia. Troponin elevated, possilbe NSTEMI. Pharmacy consulted for Heparin dosing.  Goal of Therapy:  Heparin level 0.3-0.7 units/ml Monitor platelets by anticoagulation protocol: Yes   Plan:  Give 4000 units bolus x 1 Start heparin infusion at 1400 units/hr Check anti-Xa level in 6 hours and daily while on heparin Continue to monitor H&H and platelets  Paulina Fusi, PharmD, BCPS 04/01/2019 9:01 AM

## 2019-04-01 NOTE — ED Triage Notes (Signed)
Pt ambulatory from cab into [facility] with steady gait, no distress noted. Pt reports he has had HA, cough and SOB x2 days with SOB increasing in last day.

## 2019-04-01 NOTE — ED Notes (Signed)
Pt HR>110 and SBP is > than 115, diltiazem rate has been increased to 10mg /hr

## 2019-04-01 NOTE — Consult Note (Signed)
Name: MARTINIANO HUE MRN: [id number] DOB: Apr 26, 1981    ADMISSION DATE:  04/01/2019 CONSULTATION DATE: 04/01/2019  REFERRING MD : Dr. Posey Pronto   CHIEF COMPLAINT: Shortness of Breath   BRIEF PATIENT DESCRIPTION:  38 yo male admitted with acute hypoxic respiratory failure secondary to pulmonary edema vs. pneumonia requiring Bipap and new onset atrial flutter with rvr requiring cardizem gtt   SIGNIFICANT EVENTS/STUDIES:  08/14-Pt admitted to the stepdown unit on Bipap   HISTORY OF PRESENT ILLNESS:   This is 38 yo male with a PMH of HTN, Current Smoker, and Diastolic Dysfunction with preserved EF 60% per Echo 2017.  He presented to [facility] ER with c/o headache, productive cough, swelling bilateral lower extremity edema, and shortness of breath onset of symptoms 2 days prior to presentation.  Per ER notes he ran out of his outpatient antihypertensive and statin medications 3 months ago due to financial reasons.  He currently resides in a boarding house and is unaware if he has had contact with anyone with COVID-19.  ER lab results revealed K+ 2.6, glucose 117, BUN 24, creatinine 1.64, ALT 59, BNP 1,059, troponin 298, lactic acid 0.9, pct <0.10, wbc 8.3, and hgb 10.9.  COVID-19 negative x2, however CXR concerning for pneumonia vs. pulmonary edema.  EKG revealed atrial flutter with rvr heart rate 150's, but no clear evidence of acute ischemia.  Pt received 15 mg iv cardizem for hr control, however due to continued rvr he received an additional 20 mg iv cardizem.  Heparin gtt also initiated due to elevated troponin.  He was also hypertensive bp 210/149 and pt noted to have low grade temp of 100.6 F.  Due to concern of sepsis secondary to possible pneumonia he received azithromycin and ceftriaxone.  Pt also placed on Bipap due to increased work of breathing.  He was subsequently admitted to the stepdown unit per hospitalist team for additional workup and treatment.   PAST MEDICAL HISTORY :   has a past  medical history of Hypertension.  has a past surgical history that includes Knee surgery (Right). Prior to Admission medications   Medication Sig Start Date End Date Taking? Authorizing Provider  aspirin EC 81 MG EC tablet Take 1 tablet (81 mg total) by mouth daily. Patient not taking: Reported on 04/01/2019 12/23/15   Demetrios Loll, MD  atorvastatin (LIPITOR) 40 MG tablet Take 1 tablet (40 mg total) by mouth daily at 6 PM. Patient not taking: Reported on 04/01/2019 06/27/18   Lavonia Drafts, MD  cloNIDine (CATAPRES) 0.2 MG tablet Take 1 tablet (0.2 mg total) by mouth 2 (two) times daily. Patient not taking: Reported on 04/01/2019 06/27/18   Lavonia Drafts, MD  hydrALAZINE (APRESOLINE) 100 MG tablet Take 1 tablet (100 mg total) by mouth every 8 (eight) hours. Patient not taking: Reported on 04/01/2019 06/27/18   Lavonia Drafts, MD  isosorbide mononitrate (IMDUR) 30 MG 24 hr tablet Take 1 tablet (30 mg total) by mouth daily. Patient not taking: Reported on 04/01/2019 06/27/18   Lavonia Drafts, MD   No Known Allergies  FAMILY HISTORY:  family history includes Hypertension in his father. SOCIAL HISTORY:  reports that he has been smoking cigarettes. He has been smoking about 1.00 pack per day. He has never used smokeless tobacco. He reports current alcohol use. He reports current drug use. Drug: Cocaine.  REVIEW OF SYSTEMS: Positives in BOLD  Constitutional: Negative for fever, chills, weight loss, malaise/fatigue and diaphoresis.  HENT: Negative for hearing loss, ear pain, nosebleeds,  congestion, sore throat, neck pain, tinnitus and ear discharge.   Eyes: Negative for blurred vision, double vision, photophobia, pain, discharge and redness.  Respiratory: cough, hemoptysis, sputum production, shortness of breath, wheezing and stridor.   Cardiovascular: brief period of dull chest pain, palpitations, orthopnea, claudication, leg swelling and PND.  Gastrointestinal: Negative for heartburn, nausea,  vomiting, abdominal pain, diarrhea, constipation, blood in stool and melena.  Genitourinary: Negative for dysuria, urgency, frequency, hematuria and flank pain.  Musculoskeletal: Negative for myalgias, back pain, joint pain and falls.  Skin: Negative for itching and rash.  Neurological: Negative for dizziness, tingling, tremors, sensory change, speech change, focal weakness, seizures, loss of consciousness, weakness and headaches.  Endo/Heme/Allergies: Negative for environmental allergies and polydipsia. Does not bruise/bleed easily.  SUBJECTIVE:  Pt states his breathing has improved and he is currently off Bipap   VITAL SIGNS: Temp:  [99 F (37.2 C)-100.6 F (38.1 C)] 99 F (37.2 C) (08/14 0601) Pulse Rate:  [73-151] 151 (08/14 0830) Resp:  [14-50] 23 (08/14 0830) BP: (157-210)/(120-149) 169/132 (08/14 0830) SpO2:  [88 %-99 %] 96 % (08/14 0830) Weight:  [136.1 kg] 136.1 kg (08/14 0549)  PHYSICAL EXAMINATION: General: well developed, well nourished, NAD off Bipap  Neuro: alert and oriented, follows commands  HEENT: mild JVD, supple Cardiovascular: atrial flutter with rvr hr 140's, no R/G  Lungs: very faint crackles throughout, even, non labored  Abdomen: +BS x4, obese, soft, non tender, non distended  Musculoskeletal: trace bilateral lower extremity edema, normal tone  Skin: intact no rashes or lesions   Recent Labs  Lab 04/01/19 0603  NA 139  K 2.6*  CL 104  CO2 25  BUN 24*  CREATININE 1.64*  GLUCOSE 117*   Recent Labs  Lab 04/01/19 0603  HGB 10.9*  HCT 35.3*  WBC 8.3  PLT 323   Dg Chest Portable 1 View  Result Date: 04/01/2019 CLINICAL DATA:  Cough and shortness of breath EXAM: PORTABLE CHEST 1 VIEW COMPARISON:  12/21/2015 FINDINGS: Asymmetric patchy airspace opacity to the right. Cardiomegaly accentuated by low volumes. No effusion or pneumothorax. IMPRESSION: 1. Right more than left pulmonary opacity with patchy appearance favoring pneumonia. 2. Cardiomegaly  that is progressed from 2017. Electronically Signed   By: Monte Fantasia M.D.   On: 04/01/2019 06:24    ASSESSMENT / PLAN:  Acute hypoxic respiratory failure secondary to pulmonary edema vs. pneumonia  Current everyday smoker  Prn Bipap for dyspnea and/or hypoxia  Prn bronchodilator therapy  Repeat COVID-19 results pending  Will check ferritin, C-reactive protein, D-Dimer, fibrinogen, and sedimentation rate  Follow respiratory panel by PCR results  Repeat CXR in am Smoking cessation counseling   Acute CHF exacerbation  Uncontrolled HTN (pt ran out of medications 3 months ago due to financial reasons) New onset atrial flutter with rvr  Elevated troponin's secondary demand ischemia vs. NSTEMI  Hx: Diastolic Dysfunction with preserved EF 60% (Echo 2017) Continuous telemetry monitoring  Trend troponin's  Echo pending  Will discontinue heparin gtt and start lovenox  Continue cardizem gtt to maintain hr <115 bpm Resume outpatient antihypertensives Cardiology consulted appreciate input  Will consult care management to assist with outpatient medications needs   Acute renal failure  Hypokalemia  Trend BMP  Replace electrolytes as indicated  Monitor UOP Avoid nephrotic medications   Possible pneumonia  Trend WBC and monitor fever curve Recheck PCT  Follow cultures  Continue ceftriaxone and azithromycin for now if pct/cultures negative and pt afebrile will stop abx   Anemia  without obvious acute blood loss VTE px: heparin gtt  Trend CBC  Monitor for s/sx of bleeding and transfuse for hgb <7   Marda Stalker, [place] Pager [phone] (please enter 7 digits) PCCM Consult Pager [phone] (please enter 7 digits)

## 2019-04-01 NOTE — H&P (Addendum)
[place] [facility] [place] NAME: Gerald [name]    MR#:  [id number]  DATE OF BIRTH:  03/29/1981  DATE OF ADMISSION:  04/01/2019  PRIMARY CARE PHYSICIAN: Patient, [name]   REQUESTING/REFERRING PHYSICIAN:  Delman Kitten, MD     CHIEF COMPLAINT:   Chief Complaint  Patient presents with  . Shortness of Breath    HISTORY OF PRESENT ILLNESS: Gerald [name]  is a 38 y.o. male with a known history of hypertension and med noncompliance who presents with shortness of breath for the last 2 days.  He also has fever.  Patient states that breathing has progressively gotten worse.  He is also had productive cough.  In the emergency room patient was very short of breath therefore had to be placed on BiPAP.  Chest x-ray suggested bilateral pneumonia and he is got low-grade fever.  Patient is COVID is negative. He denies any nausea vomiting or diarrhea.     PAST MEDICAL HISTORY:   Past Medical History:  Diagnosis Date  . Hypertension     PAST SURGICAL HISTORY:  Past Surgical History:  Procedure Laterality Date  . KNEE SURGERY Right     SOCIAL HISTORY:  Social History   Tobacco Use  . Smoking status: Current Every Day Smoker    Packs/day: 1.00    Types: Cigarettes  . Smokeless tobacco: Never Used  Substance Use Topics  . Alcohol use: Yes    Comment: occas.     FAMILY HISTORY:  Family History  Problem Relation Age of Onset  . Hypertension Father     DRUG ALLERGIES: No Known Allergies  REVIEW OF SYSTEMS:   CONSTITUTIONAL: No fever, fatigue or weakness.  EYES: No blurred or double vision.  EARS, NOSE, AND THROAT: No tinnitus or ear pain.  RESPIRATORY: Positive cough, positive shortness of breath, wheezing or hemoptysis.  CARDIOVASCULAR: No chest pain, orthopnea, edema.  GASTROINTESTINAL: No nausea, vomiting, diarrhea or abdominal pain.  GENITOURINARY: No dysuria, hematuria.  ENDOCRINE: No polyuria, nocturia,  HEMATOLOGY: No anemia, easy  bruising or bleeding SKIN: No rash or lesion. MUSCULOSKELETAL: No joint pain or arthritis.   NEUROLOGIC: No tingling, numbness, weakness.  PSYCHIATRY: No anxiety or depression.   MEDICATIONS AT HOME:  Prior to Admission medications   Medication Sig Start Date End Date Taking? Authorizing Provider  aspirin EC 81 MG EC tablet Take 1 tablet (81 mg total) by mouth daily. Patient not taking: Reported on 04/01/2019 12/23/15   Demetrios Loll, MD  atorvastatin (LIPITOR) 40 MG tablet Take 1 tablet (40 mg total) by mouth daily at 6 PM. Patient not taking: Reported on 04/01/2019 06/27/18   Lavonia Drafts, MD  cloNIDine (CATAPRES) 0.2 MG tablet Take 1 tablet (0.2 mg total) by mouth 2 (two) times daily. Patient not taking: Reported on 04/01/2019 06/27/18   Lavonia Drafts, MD  hydrALAZINE (APRESOLINE) 100 MG tablet Take 1 tablet (100 mg total) by mouth every 8 (eight) hours. Patient not taking: Reported on 04/01/2019 06/27/18   Lavonia Drafts, MD  isosorbide mononitrate (IMDUR) 30 MG 24 hr tablet Take 1 tablet (30 mg total) by mouth daily. Patient not taking: Reported on 04/01/2019 06/27/18   Lavonia Drafts, MD      PHYSICAL EXAMINATION:   VITAL SIGNS: Blood pressure (!) 157/131, pulse (!) 150, temperature 99 F (37.2 C), temperature source Oral, resp. rate (!) 29, height 5\' 11"  (1.803 m), weight 136.1 kg, SpO2 (!) 88 %.  GENERAL:  38 y.o.-year-old patient lying  in the bed with respiratory distress on BiPAP EYES: Pupils equal, round, reactive to light and accommodation. No scleral icterus. Extraocular muscles intact.  HEENT: Head atraumatic, normocephalic. Oropharynx and nasopharynx clear.  NECK:  Supple, no jugular venous distention. No thyroid enlargement, no tenderness.  LUNGS: Rhonchus breath sounds bilaterally with some accessory muscle usage   CARDIOVASCULAR: S1, S2 tachycardic. No murmurs, rubs, or gallops.  ABDOMEN: Soft, nontender, nondistended. Bowel sounds present. No organomegaly or mass.   EXTREMITIES: No pedal edema, cyanosis, or clubbing.  NEUROLOGIC: Cranial nerves II through XII are intact. Muscle strength 5/5 in all extremities. Sensation intact. Gait not checked.  PSYCHIATRIC: The patient is alert and oriented x 3.  SKIN: No obvious rash, lesion, or ulcer.   LABORATORY PANEL:   CBC Recent Labs  Lab 04/01/19 0603  WBC 8.3  HGB 10.9*  HCT 35.3*  PLT 323  MCV 67.0*  MCH 20.7*  MCHC 30.9  RDW 17.2*  LYMPHSABS 1.9  MONOABS 0.6  EOSABS 0.2  BASOSABS 0.1   ------------------------------------------------------------------------------------------------------------------  Chemistries  Recent Labs  Lab 04/01/19 0603  NA 139  K 2.6*  CL 104  CO2 25  GLUCOSE 117*  BUN 24*  CREATININE 1.64*  CALCIUM 8.6*  MG 2.0  AST 28  ALT 59*  ALKPHOS 106  BILITOT 1.0   ------------------------------------------------------------------------------------------------------------------ estimated creatinine clearance is 86 mL/min (A) (by C-G formula based on SCr of 1.64 mg/dL (H)). ------------------------------------------------------------------------------------------------------------------ No results for input(s): TSH, T4TOTAL, T3FREE, THYROIDAB in the last 72 hours.  Invalid input(s): FREET3   Coagulation profile Recent Labs  Lab 04/01/19 0603  INR 1.2   ------------------------------------------------------------------------------------------------------------------- No results for input(s): DDIMER in the last 72 hours. -------------------------------------------------------------------------------------------------------------------  Cardiac Enzymes No results for input(s): CKMB, TROPONINI, MYOGLOBIN in the last 168 hours.  Invalid input(s): CK ------------------------------------------------------------------------------------------------------------------ Invalid input(s):  POCBNP  ---------------------------------------------------------------------------------------------------------------  Urinalysis No results found for: COLORURINE, APPEARANCEUR, LABSPEC, PHURINE, GLUCOSEU, HGBUR, BILIRUBINUR, KETONESUR, PROTEINUR, UROBILINOGEN, NITRITE, LEUKOCYTESUR   RADIOLOGY: Dg Chest Portable 1 View  Result Date: 04/01/2019 CLINICAL DATA:  Cough and shortness of breath EXAM: PORTABLE CHEST 1 VIEW COMPARISON:  12/21/2015 FINDINGS: Asymmetric patchy airspace opacity to the right. Cardiomegaly accentuated by low volumes. No effusion or pneumothorax. IMPRESSION: 1. Right more than left pulmonary opacity with patchy appearance favoring pneumonia. 2. Cardiomegaly that is progressed from 2017. Electronically Signed   By: Monte Fantasia M.D.   On: 04/01/2019 06:24    EKG: Orders placed or performed during the hospital encounter of 04/01/19  . ED EKG  . ED EKG  . EKG 12-Lead  . EKG 12-Lead    IMPRESSION AND PLAN: Patient is a 38 year old African-American male with history of cocaine abuse hypertension presenting with shortness of breath and fever  1.  Acute respiratory failure due to pneumonia Even though COVID test is negative is still high on the differential we will keep him on isolation check inflammatory markers I have discussed the case with Dr. Jamal Collin patient will go to the ICU We will also give him doses of IV Lasix to see if that helps with his respiratory status his BNP is very high Repeat procalcitonin a few hours, repeat COVID test again tomorrow   2.  Atrial tachycardia patient on Cardizem drip right now heart rate still high I will ask cardiology to see will obtain echocardiogram of the heart   3.  Troponin elevation possible non-ST MI we will treat with IV heparin  4.  Severe hypokalemia we will give him IV potassium  magnesium is normal  5 Accelerated hypertension use IV hydralazine as needed  6.  Nicotine abuse smoking cessation provided 4  minutes spent strongly recommend patient stop smoking  7.  Cocaine abuse               All the records are reviewed and case discussed with ED provider. Management plans discussed with the patient, family and they are in agreement.  CODE STATUS: Code Status History    Date Active Date Inactive Code Status Order ID Comments User Context   12/21/2015 1501 12/23/2015 1607 Full Code [id number]  Demetrios Loll, MD Inpatient   Advance Care Planning Activity       TOTAL TIME TAKING CARE OF THIS PATIENT:55 minutes critical care time spent.    Dustin Flock M.D on 04/01/2019 at 8:44 AM  Between 7am to 6pm - Pager - [phone]  After 6pm go to [email or web address] - password [email or web address] [facility]  [facility]  [phone]  CC: Primary care physician; Patient, [name]

## 2019-04-01 NOTE — Consult Note (Signed)
Cardiology Consultation:   Patient ID: ZAIR CASSERLY [id number]; 09/27/1980   Admit date: 04/01/2019 Date of Consult: 04/01/2019  Primary Care Provider: Patient, [name] Primary Cardiologist: new to [facility] - consult by [place]   Patient Profile:   Gerald [name] is a 38 y.o. male with a hx of polysubstance abuse including cocaine, alcohol, and tobacco use, diastolic dysfunction, HTN, and obesity who is being seen today for the evaluation of new onset atrial flutter with RVR and elevated troponin at the request of Dr. Posey Pronto.  History of Present Illness:   Gerald [name] was previously admitted in 2017 with CAP, chest pain and elevated troponin peaking at 0.18. He was managed conservatively with heparin gtt and ASA per outside cardiology group. Echo at that time showed an EF of 60%, severe concentric LVH, Gr1DD, mild MR, mildly dilated LA. His chest pain was felt to be atypical with outpatient follow up recommended. He was seen in the ED in 06/2018 after having been out of his medications for ~ 6 months.   Patient ran out of his medications including antihypertensives and statin approximately 3 months prior due to financial constraints.  He currently resides in a boardinghouse and is unaware if he has had any contact with positive COVID-19 residents.  He presented to [facility] with a 2 to 3-day history of increasing bilateral lower extremity swelling, increased shortness of breath, productive cough that occasionally is tinged with blood, and sputum with subjective fever.  Patient had one brief episode of dull chest pain occurring at rest and has been chest pain-free since.  Upon the patient's arrival to [facility] they were found to have BP 210/149, HR 150 bpm, T max 100.6, oxygen saturation 94% on room air, weight documented at 136.1 kg. EKG showed new onset atrial flutter with RVR with 2:1 AV block, 151 bpm, nonspecific IVCD/st/t changes possibly rate related, CXR showed R>L patchy opacity favoring  PNA vs pulmonary edema with progression of cardiomegaly when compared to 2017 study. Labs showed high sensitivity troponin 298, BNP 1059, COVID-19 negative, WBC 8.3, HGB 10.9, PLT 323. K+ 2.6, Na+ 139, SCr 1.64, albumin 3.1, AST normal, ALT 59, PCT <0.10, Mg++ 2.0, UDS positive for cannabinoid. In the ED, he received azithromycin and Rocephin as well as IV diltiazem and was placed on a diltiazem gtt. He will be admitted to the ICU under isolation protocol with planned repeat of COVID test in the morning per IM. Upon arrival to the ICU his diltiazem gtt has been titrated up to 15 mg/hr. He has received IV metoprolol 5 mg x 2 as well as digoxin 500 mcg with brief transition from 2:1 AV block to variable block and is now back in 2:1 AV block. BP remains in the [id number] systolic.   Past Medical History:  Diagnosis Date   Hypertension    Polysubstance abuse ([place])     Past Surgical History:  Procedure Laterality Date   KNEE SURGERY Right      Home Meds: Prior to Admission medications   Medication Sig Start Date End Date Taking? Authorizing Provider  aspirin EC 81 MG EC tablet Take 1 tablet (81 mg total) by mouth daily. Patient not taking: Reported on 04/01/2019 12/23/15   Demetrios Loll, MD  atorvastatin (LIPITOR) 40 MG tablet Take 1 tablet (40 mg total) by mouth daily at 6 PM. Patient not taking: Reported on 04/01/2019 06/27/18   Lavonia Drafts, MD  cloNIDine (CATAPRES) 0.2 MG tablet Take 1  tablet (0.2 mg total) by mouth 2 (two) times daily. Patient not taking: Reported on 04/01/2019 06/27/18   Lavonia Drafts, MD  hydrALAZINE (APRESOLINE) 100 MG tablet Take 1 tablet (100 mg total) by mouth every 8 (eight) hours. Patient not taking: Reported on 04/01/2019 06/27/18   Lavonia Drafts, MD  isosorbide mononitrate (IMDUR) 30 MG 24 hr tablet Take 1 tablet (30 mg total) by mouth daily. Patient not taking: Reported on 04/01/2019 06/27/18   Lavonia Drafts, MD    Inpatient Medications: Scheduled Meds:  aspirin EC   325 mg Oral Daily   aspirin  81 mg Oral Daily   atorvastatin  40 mg Oral q1800   cloNIDine  0.2 mg Oral BID   furosemide  40 mg Intravenous Q12H   heparin  4,000 Units Intravenous Once   hydrALAZINE  100 mg Oral Q8H   isosorbide mononitrate  30 mg Oral Daily   sodium chloride flush  3 mL Intravenous Q12H   Continuous Infusions:  sodium chloride     [START ON 04/02/2019] azithromycin     [START ON 04/02/2019] cefTRIAXone (ROCEPHIN)  IV     diltiazem (CARDIZEM) infusion 10 mg/hr (04/01/19 0817)   heparin     potassium chloride     PRN Meds:   Allergies:  No Known Allergies  Social History:   Social History   Socioeconomic History   Marital status: Single    Spouse name: Not on file   Number of children: Not on file   Years of education: Not on file   Highest education level: Not on file  Occupational History   Not on file  Social Needs   Financial resource strain: Not on file   Food insecurity    Worry: Not on file    Inability: Not on file   Transportation needs    Medical: Not on file    Non-medical: Not on file  Tobacco Use   Smoking status: Current Every Day Smoker    Packs/day: 1.00    Types: Cigarettes   Smokeless tobacco: Never Used  Substance and Sexual Activity   Alcohol use: Yes    Comment: occas.    Drug use: Yes    Types: Cocaine   Sexual activity: Not on file  Lifestyle   Physical activity    Days per week: Not on file    Minutes per session: Not on file   Stress: Not on file  Relationships   Social connections    Talks on phone: Not on file    Gets together: Not on file    Attends religious service: Not on file    Active member of club or organization: Not on file    Attends meetings of clubs or organizations: Not on file    Relationship status: Not on file   Intimate partner violence    Fear of current or ex partner: Not on file    Emotionally abused: Not on file    Physically abused: Not on file    Forced  sexual activity: Not on file  Other Topics Concern   Not on file  Social History Narrative   Not on file     Family History:   Family History  Problem Relation Age of Onset   Hypertension Father     ROS:  Review of Systems  Constitutional: Positive for chills, fever and malaise/fatigue. Negative for diaphoresis and weight loss.  HENT: Negative for congestion.   Eyes: Negative for discharge and redness.  Respiratory:  Positive for shortness of breath. Negative for cough, hemoptysis, sputum production and wheezing.   Cardiovascular: Positive for leg swelling. Negative for chest pain, palpitations, orthopnea, claudication and PND.  Gastrointestinal: Negative for abdominal pain, blood in stool, heartburn, melena, nausea and vomiting.  Genitourinary: Negative for hematuria.  Musculoskeletal: Negative for falls and myalgias.  Skin: Negative for rash.  Neurological: Positive for weakness. Negative for dizziness, tingling, tremors, sensory change, speech change, focal weakness and loss of consciousness.  Endo/Heme/Allergies: Does not bruise/bleed easily.  Psychiatric/Behavioral: Negative for substance abuse. The patient is not nervous/anxious.   All other systems reviewed and are negative.     Physical Exam/Data:   Vitals:   04/01/19 0745 04/01/19 0800 04/01/19 0815 04/01/19 0830  BP: (!) 157/131 (!) 167/149 (!) 185/136 (!) 169/132  Pulse: (!) 150 (!) 151 (!) 150 (!) 151  Resp: (!) 29 (!) 22 (!) 22 (!) 23  Temp:      TempSrc:      SpO2: (!) 88% (!) 89% 98% 96%  Weight:      Height:        Intake/Output Summary (Last 24 hours) at 04/01/2019 0951 Last data filed at 04/01/2019 0817 Gross per 24 hour  Intake 106.46 ml  Output --  Net 106.46 ml   Filed Weights   04/01/19 0549  Weight: 136.1 kg   Body mass index is 41.84 kg/m.   Physical Exam: General: Well developed, well nourished, in no acute distress. Head: Normocephalic, atraumatic, sclera non-icteric, no xanthomas,  nares without discharge.  Neck: Negative for carotid bruits. JVD not elevated. Lungs: Diminished breath sounds bilaterally with crackles along the bilateral bases. Breathing is unlabored. Heart: Tachycardic with S1 S2. No murmurs, rubs, or gallops appreciated. Abdomen: Soft, non-tender, non-distended with normoactive bowel sounds. No hepatomegaly. No rebound/guarding. No obvious abdominal masses. Msk:  Strength and tone appear normal for age. Extremities: No clubbing or cyanosis. No edema. Distal pedal pulses are 2+ and equal bilaterally. Neuro: Alert and oriented X 3. No facial asymmetry. No focal deficit. Moves all extremities spontaneously. Psych:  Responds to questions appropriately with a normal affect.   EKG:  The EKG was personally reviewed and demonstrates: atrial flutter with RVR with 2:1 AV block, 151 bpm, nonspecific IVCD possibly rate related, nonspecific st/t changes Telemetry:  Telemetry was personally reviewed and demonstrates: atrial flutter with RVR with 2:1 AV block, 140s to 150s bpm  Weights: Filed Weights   04/01/19 0549  Weight: 136.1 kg    Relevant CV Studies: 2D Echo 12/2015: - Left ventricle: The cavity size was mildly dilated. There was   severe concentric hypertrophy. Systolic function was normal. The   estimated ejection fraction was 60%. Doppler parameters are   consistent with abnormal left ventricular relaxation (grade 1   diastolic dysfunction). - Mitral valve: There was mild regurgitation. - Left atrium: The atrium was mildly dilated.  Impression: - Dilated left atrium and right atrium with the normal left   ventricular systolic function but severe LVH and mild mitral   regurgitation.  Laboratory Data:  Chemistry Recent Labs  Lab 04/01/19 0603  NA 139  K 2.6*  CL 104  CO2 25  GLUCOSE 117*  BUN 24*  CREATININE 1.64*  CALCIUM 8.6*  GFRNONAA 52*  GFRAA >60  ANIONGAP 10    Recent Labs  Lab 04/01/19 0603  PROT 6.7  ALBUMIN 3.1*  AST  28  ALT 59*  ALKPHOS 106  BILITOT 1.0   Hematology Recent Labs  Lab 04/01/19  0603  WBC 8.3  RBC 5.27  HGB 10.9*  HCT 35.3*  MCV 67.0*  MCH 20.7*  MCHC 30.9  RDW 17.2*  PLT 323   Cardiac EnzymesNo results for input(s): TROPONINI in the last 168 hours. No results for input(s): TROPIPOC in the last 168 hours.  BNP Recent Labs  Lab 04/01/19 0603  BNP 1,059.0*    DDimer No results for input(s): DDIMER in the last 168 hours.  Radiology/Studies:  Dg Chest Portable 1 View  Result Date: 04/01/2019 IMPRESSION: 1. Right more than left pulmonary opacity with patchy appearance favoring pneumonia. 2. Cardiomegaly that is progressed from 2017. Electronically Signed   By: Monte Fantasia M.D.   On: 04/01/2019 06:24    Assessment and Plan:   1. Elevated troponin: -Initial high-sensitivity troponin of 298 with delta troponin unchanged  -Heparin gtt -Check echo -Pending echo results, the patient will need ischemic evaluation prior to discharge (noninvasive vs invasive) -ASA  2. New onset atrial flutter with RVR with 2:1 AV block: -Likely in the setting of his acute illness and hypokalemia  -He remains in atrial flutter with 2-1 AV block with RVR with ventricular rates in the 140s bpm with nonspecific IVCD and st/t changes possibly rate related, repeat EKG with improved rate -His Cardizem drip has been titrated to 15 mg/h and has received IV metoprolol 5 mg pushes x2 as well as digoxin 500 mcg x 1 with brief run of atrial flutter with variable AV block followed by return to 2-1 AV block -Patient does have a history of cocaine abuse, though UDS was negative for cocaine this admission -Add esmolol drip with plans to discontinue this initially if needed -We will try to avoid IV amiodarone for rate control as it appears he has likely been in this rhythm for at least 2 to 3 days -Goal HR < 115 bpm for now -Could taper off some non-rate limiting antihypertensives if needed for addition of  rate control -Check TSH -Replete potassium to goal of 4.0 -Magnesium at goal -Heparin gtt -CHADS2VASc at least 1 (HTN)  -If his ventricular rates continue to be difficult to control, he will require TEE-guided DCCV prior to discharge once his acute illness is improved  3. Acute respiratory distress with hypoxia with likely acute CHF (type unknown): -In the setting of acute pulmonary edema vs PNA and exacerbated by atrial tachycardia -BNP 1059 with CXR showing possible PNA vs pulmonary edema with progressive cardiomegaly  -He was given IV Lasix at time of admission, continue IV diuresis with close monitoring of renal function and lytes with recommendation to replete potassium to goal of 4.0 -ABX per PCCM/IM  4. Possible PNA/fever: -Isolation protocol  -IM planning for repeat COVID testing in the morning -Per PCCM/IM  5. Accelerated HTN: -Placed on IV Lasix, clonidine, hydralazine, Imdur, and diltiazem gtt upon admission -Monitor with escalation as needed   6. Polysubstance abuse: -Drug screen negative for cocaine this admission -Cessation of marijuana, alcohol, and tobacco is recommended   7. Hypokalemia: -Recommend repletion to goal 4.0     For questions or updates, please contact [place] Please consult [email or web address] for contact info under Cardiology/STEMI.   Signed, Christell Faith, PA-C [facility] Pager: [phone] 04/01/2019, 9:51 AM

## 2019-04-01 NOTE — ED Notes (Signed)
Report given to ICU RN, respiratory informed of need for transport assistance

## 2019-04-01 NOTE — ED Notes (Signed)
Pt HR is > 110 and SBP is >115, diltiazem drip has been titrated to 12.5 mg/hr

## 2019-04-01 NOTE — Progress Notes (Addendum)
[place] for heparin drip management Indication: chest pain/ACS  Patient Measurements: Height: 5\' 11"  (180.3 cm) Weight: 300 lb (136.1 kg) IBW/kg (Calculated) : 75.3  Vital Signs: Temp: 99 F (37.2 C) (08/14 0601) Temp Source: Oral (08/14 0601) BP: 164/132 (08/14 1000) Pulse Rate: 146 (08/14 1215)  Labs: Recent Labs    04/01/19 0603 04/01/19 0827  HGB 10.9*  --   HCT 35.3*  --   PLT 323  --   LABPROT 14.6  --   INR 1.2  --   CREATININE 1.64*  --   TROPONINIHS 298* 298*    Estimated Creatinine Clearance: 86 mL/min (A) (by C-G formula based on SCr of 1.64 mg/dL (H)).   Medical History: Past Medical History:  Diagnosis Date  . Hypertension   . Polysubstance abuse [facility])    Assessment: Patient is a 38yo male admitted for pneumonia. Troponin elevated, possible NSTEMI. Pharmacy consulted for Lovenox dosing. Heparin was originally ordered but never started. He was on no chronic anticoagulation PTA. The patient had limited IV access and the heparin drip was held for approximately 2 hours while a PICC line was placed. H&H low at baseline, PLT wnl  Goal of Therapy:  Heparin level 0.3-0.7 units/ml Monitor platelets by anticoagulation protocol: Yes   Plan:   A chart review revealed a previous admission in which heparin was administered: the patient required 1700 units/hr to achieve a therapeutic heparin level  Restart heparin drip at 1700 units/hr  Check heparin level 6 hours after restart/rate change  CBC in am  Continue to monitor H&H and platelets  Vallery Sa, PharmD Clinical Pharmacist 04/01/2019 1:08 PM

## 2019-04-02 ENCOUNTER — Inpatient Hospital Stay (HOSPITAL_COMMUNITY)
Admit: 2019-04-02 | Discharge: 2019-04-02 | Disposition: A | Discharge status: Home / Self Care | Payer: Self-pay | Attending: Internal Medicine | Admitting: Internal Medicine

## 2019-04-02 ENCOUNTER — Inpatient Hospital Stay: Payer: Self-pay

## 2019-04-02 DIAGNOSIS — E876 Hypokalemia: Secondary | ICD-10-CM

## 2019-04-02 DIAGNOSIS — I361 Nonrheumatic tricuspid (valve) insufficiency: Secondary | ICD-10-CM

## 2019-04-02 DIAGNOSIS — I340 Nonrheumatic mitral (valve) insufficiency: Secondary | ICD-10-CM

## 2019-04-02 DIAGNOSIS — N179 Acute kidney failure, unspecified: Secondary | ICD-10-CM

## 2019-04-02 LAB — CBC
HCT: 31.4 % — ABNORMAL LOW (ref 39.0–52.0)
Hemoglobin: 9.9 g/dL — ABNORMAL LOW (ref 13.0–17.0)
MCH: 21.3 pg — ABNORMAL LOW (ref 26.0–34.0)
MCHC: 31.5 g/dL (ref 30.0–36.0)
MCV: 67.7 fL — ABNORMAL LOW (ref 80.0–100.0)
Platelets: 308 10*3/uL (ref 150–400)
RBC: 4.64 MIL/uL (ref 4.22–5.81)
RDW: 17 % — ABNORMAL HIGH (ref 11.5–15.5)
WBC: 8.9 10*3/uL (ref 4.0–10.5)
nRBC: 0 % (ref 0.0–0.2)

## 2019-04-02 LAB — ECHOCARDIOGRAM COMPLETE
Height: 71 in
Weight: 4800 oz

## 2019-04-02 LAB — MRSA PCR SCREENING: MRSA by PCR: NEGATIVE

## 2019-04-02 LAB — LACTATE DEHYDROGENASE: LDH: 183 U/L (ref 98–192)

## 2019-04-02 LAB — PROCALCITONIN: Procalcitonin: <0.1 ng/mL

## 2019-04-02 LAB — HEPARIN LEVEL (UNFRACTIONATED): Heparin Unfractionated: <0.1 IU/mL — ABNORMAL LOW (ref 0.30–0.70)

## 2019-04-02 MED ORDER — HEPARIN BOLUS VIA INFUSION
3000.0000 [IU] | Freq: Once | INTRAVENOUS | Status: AC
  Administered 2019-04-02: 3000 [IU] via INTRAVENOUS
  Filled 2019-04-02: qty 3000

## 2019-04-02 MED ORDER — CARVEDILOL 12.5 MG PO TABS
12.5000 mg | ORAL_TABLET | Freq: Two times a day (BID) | ORAL | Status: DC
  Administered 2019-04-02 – 2019-04-04 (×5): 12.5 mg via ORAL
  Filled 2019-04-02 (×5): qty 1

## 2019-04-02 MED ORDER — RIVAROXABAN 20 MG PO TABS
20.0000 mg | ORAL_TABLET | Freq: Every day | ORAL | Status: DC
  Administered 2019-04-02 – 2019-04-08 (×7): 20 mg via ORAL
  Filled 2019-04-02 (×9): qty 1

## 2019-04-02 MED ORDER — ENOXAPARIN SODIUM 150 MG/ML ~~LOC~~ SOLN
135.0000 mg | Freq: Two times a day (BID) | SUBCUTANEOUS | Status: DC
  Administered 2019-04-02: 135 mg via SUBCUTANEOUS
  Filled 2019-04-02 (×2): qty 0.9

## 2019-04-02 MED ORDER — POTASSIUM CHLORIDE CRYS ER 20 MEQ PO TBCR
40.0000 meq | EXTENDED_RELEASE_TABLET | Freq: Four times a day (QID) | ORAL | Status: AC
  Administered 2019-04-02 (×3): 40 meq via ORAL
  Filled 2019-04-02 (×3): qty 2

## 2019-04-02 MED ORDER — DILTIAZEM HCL ER 60 MG PO CP12
120.0000 mg | ORAL_CAPSULE | Freq: Two times a day (BID) | ORAL | Status: DC
  Administered 2019-04-02 – 2019-04-04 (×5): 120 mg via ORAL
  Filled 2019-04-02 (×6): qty 2

## 2019-04-02 MED ORDER — DIGOXIN 250 MCG PO TABS
0.2500 mg | ORAL_TABLET | Freq: Every day | ORAL | Status: DC
  Administered 2019-04-02 – 2019-04-04 (×3): 0.25 mg via ORAL
  Filled 2019-04-02 (×3): qty 1

## 2019-04-02 MED ORDER — METOPROLOL TARTRATE 50 MG PO TABS
50.0000 mg | ORAL_TABLET | Freq: Two times a day (BID) | ORAL | Status: DC
  Administered 2019-04-02: 50 mg via ORAL
  Filled 2019-04-02: qty 1

## 2019-04-02 NOTE — Progress Notes (Signed)
[facility] [place] NAME: Gerald [name]    MR#:  [id number]  DATE OF BIRTH:  [birth year]  SUBJECTIVE:  CHIEF COMPLAINT:   Chief Complaint  Patient presents with  . Shortness of Breath   Still has shortness of breath.  Some improvement.  Blood pressure improved. On esmolol and Cardizem drip in the morning and transition to oral medications.  REVIEW OF SYSTEMS:    Review of Systems  Constitutional: Positive for malaise/fatigue. Negative for chills and fever.  HENT: Negative for sore throat.   Eyes: Negative for blurred vision, double vision and pain.  Respiratory: Positive for cough and shortness of breath. Negative for hemoptysis and wheezing.   Cardiovascular: Positive for orthopnea. Negative for chest pain, palpitations and leg swelling.  Gastrointestinal: Negative for abdominal pain, constipation, diarrhea, heartburn, nausea and vomiting.  Genitourinary: Negative for dysuria and hematuria.  Musculoskeletal: Negative for back pain and joint pain.  Skin: Negative for rash.  Neurological: Negative for sensory change, speech change, focal weakness and headaches.  Endo/Heme/Allergies: Does not bruise/bleed easily.  Psychiatric/Behavioral: Negative for depression. The patient is not nervous/anxious.     DRUG ALLERGIES:  No Known Allergies  VITALS:  Blood pressure (!) 97/54, pulse 71, temperature 98 F (36.7 C), temperature source Oral, resp. rate (!) 22, height 5\' 11"  (1.803 m), weight 136.1 kg, SpO2 98 %.  PHYSICAL EXAMINATION:   Physical Exam  GENERAL:  38 y.o.-year-old patient lying in the bed with no acute distress.  Obese.  Conversational dyspnea EYES: Pupils equal, round, reactive to light and accommodation. No scleral icterus. Extraocular muscles intact.  HEENT: Head atraumatic, normocephalic. Oropharynx and nasopharynx clear.  NECK:  Supple, no jugular venous distention. No thyroid enlargement, no tenderness.  LUNGS: Decreased  air entry  CARDIOVASCULAR: S1, S2 normal. No murmurs, rubs, or gallops.  ABDOMEN: Soft, nontender, nondistended. Bowel sounds present. No organomegaly or mass.  EXTREMITIES: No cyanosis, clubbing or edema b/l.    NEUROLOGIC: Cranial nerves II through XII are intact. No focal Motor or sensory deficits b/l.   PSYCHIATRIC: The patient is alert and oriented x 3.  SKIN: No obvious rash, lesion, or ulcer.   LABORATORY PANEL:   CBC Recent Labs  Lab 04/02/19 0634  WBC 8.9  HGB 9.9*  HCT 31.4*  PLT 308   ------------------------------------------------------------------------------------------------------------------ Chemistries  Recent Labs  Lab 04/01/19 0603 04/01/19 2100  NA 139 137  K 2.6* 3.3*  CL 104 102  CO2 25 24  GLUCOSE 117* 110*  BUN 24* 28*  CREATININE 1.64* 1.53*  CALCIUM 8.6* 8.4*  MG 2.0 2.0  AST 28  --   ALT 59*  --   ALKPHOS 106  --   BILITOT 1.0  --    ------------------------------------------------------------------------------------------------------------------  Cardiac Enzymes No results for input(s): TROPONINI in the last 168 hours. ------------------------------------------------------------------------------------------------------------------  RADIOLOGY:  Dg Chest Port 1 View  Result Date: 04/02/2019 CLINICAL DATA:  Respiratory failure EXAM: PORTABLE CHEST 1 VIEW COMPARISON:  04/01/2019 FINDINGS: Unchanged cardiomegaly. Patchy airspace opacities, right greater than left, are unchanged. No pleural effusion or pneumothorax. There is a right PICC line now present with tip projecting over the lower SVC. IMPRESSION: Patchy right greater than left airspace opacities, unchanged, again suggestive of pneumonia. Electronically Signed   By: Ulyses Jarred M.D.   On: 04/02/2019 06:03   Dg Chest Portable 1 View  Result Date: 04/01/2019 CLINICAL DATA:  Cough and shortness of breath EXAM: PORTABLE CHEST 1 VIEW COMPARISON:  12/21/2015 FINDINGS: Asymmetric patchy  airspace opacity to the right. Cardiomegaly accentuated by low volumes. No effusion or pneumothorax. IMPRESSION: 1. Right more than left pulmonary opacity with patchy appearance favoring pneumonia. 2. Cardiomegaly that is progressed from 2017. Electronically Signed   By: Monte Fantasia M.D.   On: 04/01/2019 06:24   [place] Ekg Site Rite  Result Date: 04/01/2019 If Site Rite image not attached, placement could not be confirmed due to current cardiac rhythm.    ASSESSMENT AND PLAN:   *Acute on chronic systolic congestive heart failure - IV Lasix, Beta blockers - Input and Output - Counseled to limit fluids and Salt - Monitor Bun/Cr and Potassium - Echo-ejection fraction 30 to 35% -Cardiology follow up   *Acute hypoxic respiratory failure secondary to CHF.  Wean oxygen as tolerated  *Atrial flutter.  Patient is on Coreg, Cardizem, digoxin this time.  Scheduled for cardioversion on Monday.  On anticoagulation.  *Hypertension  On Coreg, Cardizem, clonidine.  Improved.   All the records are reviewed and case discussed with Care Management/Social Worker Management plans discussed with the patient, family and they are in agreement.  CODE STATUS: FULL CODE  DVT Prophylaxis: SCDs  TOTAL TIME TAKING CARE OF THIS PATIENT: 35 minutes.   POSSIBLE D/C IN 1-2 DAYS, DEPENDING ON CLINICAL CONDITION.  Leia Alf Corin Formisano M.D on 04/02/2019 at 2:15 PM  Between 7am to 6pm - Pager - [phone]  After 6pm go to [email or web address] - password [email or web address] [facility]  Office  [phone]  CC: Primary care physician; Patient, [name]  Note: This dictation was prepared with Dragon dictation along with smaller phrase technology. Any transcriptional errors that result from this process are unintentional.

## 2019-04-02 NOTE — Progress Notes (Signed)
Feels better and looks better today.  No new complaints.  No overt distress.  Remains on diltiazem and esmolol infusions.  Vitals:   04/02/19 0930 04/02/19 1000 04/02/19 1045 04/02/19 1100  BP: 135/82 124/80 123/90 (!) 149/84  Pulse: (!) 47  (!) 101 98  Resp: (!) 26 (!) 36 (!) 27 (!) 34  Temp:      TempSrc:      SpO2: 99%  100% 97%  Weight:      Height:      2 LPM [place]  Gen: NAD HEENT: NCAT, sclerae white Neck: JVP cannot be visualized Lungs: Bibasilar crackles, no wheezes Cardiovascular: IR (variable flutter), tachy, no M noted Abdomen: Soft, nontender, normal BS Ext: without clubbing, cyanosis.  Trace symmetric lower extremity edema Neuro: No focal deficits Skin: Limited exam, no lesions noted  BMP Latest Ref Rng & Units 04/01/2019 04/01/2019 12/23/2015  Glucose 70 - 99 mg/dL 110(H) 117(H) 101(H)  BUN 6 - 20 mg/dL 28(H) 24(H) 20  Creatinine 0.61 - 1.24 mg/dL 1.53(H) 1.64(H) 1.43(H)  Sodium 135 - 145 mmol/L 137 139 139  Potassium 3.5 - 5.1 mmol/L 3.3(L) 2.6(LL) 3.7  Chloride 98 - 111 mmol/L 102 104 108  CO2 22 - 32 mmol/L 24 25 22   Calcium 8.9 - 10.3 mg/dL 8.4(L) 8.6(L) 8.8(L)   CBC Latest Ref Rng & Units 04/02/2019 04/01/2019 12/23/2015  WBC 4.0 - 10.5 K/uL 8.9 8.3 9.5  Hemoglobin 13.0 - 17.0 g/dL 9.9(L) 10.9(L) 12.1(L)  Hematocrit 39.0 - 52.0 % 31.4(L) 35.3(L) 37.8(L)  Platelets 150 - 400 K/uL 308 323 259   PCT < 0.10 x 2  CXR: Cardiomegaly with edema pattern  IMPRESSION: Acute hypoxemic respiratory failure Acute pulmonary edema Atrial flutter with variable block.  Difficult rate control AKI, nonoliguric Suspect CKD Hypokalemia due to loop diuresis Doubt pneumonia (procalcitonin negative x2) Anemia without overt blood loss  PLAN/REC: Continue supplemental oxygen as needed to maintain SPO2 >92% Change unfractionated heparin infusion to enoxaparin, full dose We will try to transition off of infusions to metoprolol and diltiazem Initiate digoxin 0.25 mg daily Monitor  BMET intermittently Monitor I/Os Correct electrolytes as indicated Potassium supplemented 8/15 Discontinue ceftriaxone, azithromycin 8/15 Monitor CBC  Merton Border, MD PCCM service Mobile [phone] Pager [phone] 04/02/2019 11:33 AM

## 2019-04-02 NOTE — Progress Notes (Signed)
ANTICOAGULATION CONSULT NOTE  Pharmacy Consult for heparin drip management Indication: chest pain/ACS  Patient Measurements: Height: 5\' 11"  (180.3 cm) Weight: 300 lb (136.1 kg) IBW/kg (Calculated) : 75.3  Vital Signs: Temp: 98.6 F (37 C) (08/14 2000) Temp Source: Oral (08/14 2000) BP: 147/112 (08/15 0100) Pulse Rate: 114 (08/15 0100)  Labs: Recent Labs    04/01/19 0603 04/01/19 0827 04/01/19 2100 04/02/19 0003  HGB 10.9*  --   --   --   HCT 35.3*  --   --   --   PLT 323  --   --   --   LABPROT 14.6  --   --   --   INR 1.2  --   --   --   HEPARINUNFRC  --   --   --  <0.10*  CREATININE 1.64*  --  1.53*  --   TROPONINIHS 298* 298*  --   --     Estimated Creatinine Clearance: 92.2 mL/min (A) (by C-G formula based on SCr of 1.53 mg/dL (H)).   Medical History: Past Medical History:  Diagnosis Date  . Hypertension   . Polysubstance abuse [facility])    Assessment: Patient is a 38yo male admitted for pneumonia. Troponin elevated, possible NSTEMI. Pharmacy consulted for Lovenox dosing. Heparin was originally ordered but never started. He was on no chronic anticoagulation PTA. The patient had limited IV access and the heparin drip was held for approximately 2 hours while a PICC line was placed. H&H low at baseline, PLT wnl  Goal of Therapy:  Heparin level 0.3-0.7 units/ml Monitor platelets by anticoagulation protocol: Yes   Plan:  08/15 @ 0000 HL < 0.10 subtherapeutic. Per RN drip was not stopped on her shift, was only stopped earlier in the day for PICC placement, otherwise has been running. Will rebolus w/ heparin 3000 units IV x 1 and increase rate to 1950 units/hr and will recheck HL @ 0800, CBC w/ am labs, will continue to monitor.  Tobie Lords, PharmD, BCPS Clinical Pharmacist 04/02/2019 2:16 AM

## 2019-04-02 NOTE — Progress Notes (Signed)
Progress Note  Patient Name: Gerald [name] Date of Encounter: 04/02/2019  Primary Cardiologist: No primary care provider on file.   Subjective   Continues to feel short of breath but notes a little improvement. Cannot lie flat. No chest pain.   Inpatient Medications    Scheduled Meds:  aspirin EC  81 mg Oral Daily   atorvastatin  40 mg Oral q1800   cloNIDine  0.2 mg Oral BID   digoxin  0.25 mg Oral Daily   diltiazem  120 mg Oral Q12H   enoxaparin (LOVENOX) injection  135 mg Subcutaneous BID   furosemide  40 mg Intravenous Q12H   hydrALAZINE  100 mg Oral Q8H   isosorbide mononitrate  30 mg Oral Daily   metoprolol tartrate  50 mg Oral BID   potassium chloride  40 mEq Oral Q6H   sodium chloride flush  10-40 mL Intracatheter Q12H   Continuous Infusions:  sodium chloride     diltiazem (CARDIZEM) infusion Stopped (04/02/19 1245)   esmolol Stopped (04/02/19 1245)   PRN Meds: sodium chloride, acetaminophen **OR** acetaminophen, ondansetron **OR** ondansetron (ZOFRAN) IV, sodium chloride flush   Vital Signs    Vitals:   04/02/19 1130 04/02/19 1200 04/02/19 1300 04/02/19 1330  BP: 114/77 112/64 104/73 114/63  Pulse: 96 71 71 73  Resp: (!) 25 16 (!) 23 (!) 33  Temp:  98 F (36.7 C)    TempSrc:  Oral    SpO2: 98% 99% 100% 98%  Weight:      Height:        Intake/Output Summary (Last 24 hours) at 04/02/2019 1352 Last data filed at 04/02/2019 1330 Gross per 24 hour  Intake 2287.63 ml  Output 2250 ml  Net 37.63 ml   Last 3 Weights 04/01/2019 06/27/2018 12/21/2015  Weight (lbs) 300 lb 265 lb 313 lb  Weight (kg) 136.079 kg 120.203 kg 141.976 kg      Telemetry    Atrial flutter with 4:1 conduction, HR 72 bpm - Personally Reviewed   Physical Exam  Alert, oriented male, in no distress, sitting upright in bed GEN: No acute distress.   Neck: No JVD Cardiac: RRR, no murmurs, rubs, or gallops.  Respiratory: Clear to auscultation bilaterally. GI: Soft,  nontender, non-distended  MS: 1+ bilateral pretibial edema; No deformity. Neuro:  Nonfocal  Psych: Normal affect   Labs    High Sensitivity Troponin:   Recent Labs  Lab 04/01/19 0603 04/01/19 0827  TROPONINIHS 298* 298*      Cardiac EnzymesNo results for input(s): TROPONINI in the last 168 hours. No results for input(s): TROPIPOC in the last 168 hours.   Chemistry Recent Labs  Lab 04/01/19 0603 04/01/19 2100  NA 139 137  K 2.6* 3.3*  CL 104 102  CO2 25 24  GLUCOSE 117* 110*  BUN 24* 28*  CREATININE 1.64* 1.53*  CALCIUM 8.6* 8.4*  PROT 6.7  --   ALBUMIN 3.1*  --   AST 28  --   ALT 59*  --   ALKPHOS 106  --   BILITOT 1.0  --   GFRNONAA 52* 57*  GFRAA >60 >60  ANIONGAP 10 11     Hematology Recent Labs  Lab 04/01/19 0603 04/02/19 0634  WBC 8.3 8.9  RBC 5.27 4.64  HGB 10.9* 9.9*  HCT 35.3* 31.4*  MCV 67.0* 67.7*  MCH 20.7* 21.3*  MCHC 30.9 31.5  RDW 17.2* 17.0*  PLT 323 308    BNP Recent Labs  Lab 04/01/19 0603  BNP 1,059.0*     DDimer No results for input(s): DDIMER in the last 168 hours.   Radiology    Dg Chest Port 1 View  Result Date: 04/02/2019 CLINICAL DATA:  Respiratory failure EXAM: PORTABLE CHEST 1 VIEW COMPARISON:  04/01/2019 FINDINGS: Unchanged cardiomegaly. Patchy airspace opacities, right greater than left, are unchanged. No pleural effusion or pneumothorax. There is a right PICC line now present with tip projecting over the lower SVC. IMPRESSION: Patchy right greater than left airspace opacities, unchanged, again suggestive of pneumonia. Electronically Signed   By: Ulyses Jarred M.D.   On: 04/02/2019 06:03   Dg Chest Portable 1 View  Result Date: 04/01/2019 CLINICAL DATA:  Cough and shortness of breath EXAM: PORTABLE CHEST 1 VIEW COMPARISON:  12/21/2015 FINDINGS: Asymmetric patchy airspace opacity to the right. Cardiomegaly accentuated by low volumes. No effusion or pneumothorax. IMPRESSION: 1. Right more than left pulmonary opacity  with patchy appearance favoring pneumonia. 2. Cardiomegaly that is progressed from 2017. Electronically Signed   By: Monte Fantasia M.D.   On: 04/01/2019 06:24   [place] Ekg Site Rite  Result Date: 04/01/2019 If Site Rite image not attached, placement could not be confirmed due to current cardiac rhythm.   Cardiac Studies   Echo personally reviewed: there is severe concentric LVH, severe diffuse LV hypokinesis with LVEF estimated 30-35%, mild-moderate MR, and small pericardial effusion  Patient Profile     38 y.o. male with acute combined systolic and diastolic heart failure, new atrial flutter with RVR, now rate-controlled, and acute respiratory failure  Assessment & Plan    1. Acute combined systolic and diastolic CHF: echo reviewed and with findings above suggestive of severe hypertensive heart disease. Amyloid heart disease in differential but considering this patient's history, hypertensive heart disease with CHF is much more likely. Will change metoprolol to carvedilol, continue hydralazine/imdur, continue IV lasix. Add ACE/ARB pending stable renal function with continued IV diuresis.  2. Atrial flutter, newly diagnosed but of unknown duration. Currently on full dose lovenox. Transition to [place]. Plan TEE/Cardioversion Monday. Will make NPO tomorrow night and this will have to be arranged Monday morning. I will coordinate with Dr Rockey Situ. I have reviewed the risks/indications/alternatives to TEE/cardioversion with the patient who understands and agrees to proceed.    3. Malignant HTN/hypertensive heart disease with CHF: as above  4. Polysubstance abuse: cessation counseling done.     For questions or updates, please contact [place] Please consult [email or web address] for contact info under        Signed, Sherren Mocha, MD  04/02/2019, 1:52 PM

## 2019-04-02 NOTE — Consult Note (Signed)
ANTICOAGULATION CONSULT NOTE - Initial Consult  Pharmacy Consult for Xarelto Indication: non-valvular atrial fibrillation  No Known Allergies  Patient Measurements: Height: 5\' 11"  (180.3 cm) Weight: 300 lb (136.1 kg) IBW/kg (Calculated) : 75.3   Vital Signs: Temp: 98 F (36.7 C) (08/15 1200) Temp Source: Oral (08/15 1200) BP: 97/54 (08/15 1400) Pulse Rate: 71 (08/15 1400)  Labs: Recent Labs    04/01/19 0603 04/01/19 0827 04/01/19 2100 04/02/19 0003 04/02/19 0634  HGB 10.9*  --   --   --  9.9*  HCT 35.3*  --   --   --  31.4*  PLT 323  --   --   --  308  LABPROT 14.6  --   --   --   --   INR 1.2  --   --   --   --   HEPARINUNFRC  --   --   --  <0.10*  --   CREATININE 1.64*  --  1.53*  --   --   TROPONINIHS 298* 298*  --   --   --     Estimated Creatinine Clearance: 92.2 mL/min (A) (by C-G formula based on SCr of 1.53 mg/dL (H)).   Medical History: Past Medical History:  Diagnosis Date  . Hypertension   . Polysubstance abuse ([place])     Medications:  No PTA anti-coagulation  -as an inpatient patient has transitioned from Heparin drip to enoxaparin, and now transitioning to [place] - last dose treatment dose Enoxaparin 1006 8/15.  Assessment/Plan: Patient will begin [place] with next scheduled dose of enoxaparin, which is this evening   Goal of Therapy:  Monitor platelets by anticoagulation protocol: Yes    Lu Duffel, PharmD, BCPS Clinical Pharmacist 04/02/2019 2:22 PM

## 2019-04-03 ENCOUNTER — Inpatient Hospital Stay: Payer: Self-pay

## 2019-04-03 DIAGNOSIS — I4892 Unspecified atrial flutter: Secondary | ICD-10-CM

## 2019-04-03 DIAGNOSIS — I429 Cardiomyopathy, unspecified: Secondary | ICD-10-CM

## 2019-04-03 LAB — CBC
HCT: 31.3 % — ABNORMAL LOW (ref 39.0–52.0)
Hemoglobin: 9.8 g/dL — ABNORMAL LOW (ref 13.0–17.0)
MCH: 21.2 pg — ABNORMAL LOW (ref 26.0–34.0)
MCHC: 31.3 g/dL (ref 30.0–36.0)
MCV: 67.6 fL — ABNORMAL LOW (ref 80.0–100.0)
Platelets: 316 10*3/uL (ref 150–400)
RBC: 4.63 MIL/uL (ref 4.22–5.81)
RDW: 16.8 % — ABNORMAL HIGH (ref 11.5–15.5)
WBC: 7.8 10*3/uL (ref 4.0–10.5)
nRBC: 0 % (ref 0.0–0.2)

## 2019-04-03 LAB — COMPREHENSIVE METABOLIC PANEL
ALT: 46 U/L — ABNORMAL HIGH (ref 0–44)
AST: 25 U/L (ref 15–41)
Albumin: 3 g/dL — ABNORMAL LOW (ref 3.5–5.0)
Alkaline Phosphatase: 97 U/L (ref 38–126)
Anion gap: 8 (ref 5–15)
BUN: 23 mg/dL — ABNORMAL HIGH (ref 6–20)
CO2: 25 mmol/L (ref 22–32)
Calcium: 8.6 mg/dL — ABNORMAL LOW (ref 8.9–10.3)
Chloride: 104 mmol/L (ref 98–111)
Creatinine, Ser: 1.41 mg/dL — ABNORMAL HIGH (ref 0.61–1.24)
GFR calc Af Amer: >60 mL/min (ref >60)
GFR calc non Af Amer: >60 mL/min (ref >60)
Glucose, Bld: 100 mg/dL — ABNORMAL HIGH (ref 70–99)
Potassium: 3 mmol/L — ABNORMAL LOW (ref 3.5–5.1)
Sodium: 137 mmol/L (ref 135–145)
Total Bilirubin: 0.7 mg/dL (ref 0.3–1.2)
Total Protein: 6.2 g/dL — ABNORMAL LOW (ref 6.5–8.1)

## 2019-04-03 LAB — PHOSPHORUS: Phosphorus: 4.2 mg/dL (ref 2.5–4.6)

## 2019-04-03 LAB — MAGNESIUM: Magnesium: 1.9 mg/dL (ref 1.7–2.4)

## 2019-04-03 LAB — HIV ANTIBODY (ROUTINE TESTING W REFLEX): HIV Screen 4th Generation wRfx: NONREACTIVE

## 2019-04-03 MED ORDER — POTASSIUM CHLORIDE CRYS ER 20 MEQ PO TBCR
40.0000 meq | EXTENDED_RELEASE_TABLET | Freq: Four times a day (QID) | ORAL | Status: AC
  Administered 2019-04-03 (×2): 40 meq via ORAL
  Filled 2019-04-03 (×2): qty 2

## 2019-04-03 MED ORDER — SODIUM CHLORIDE 0.9% FLUSH: 3.0000 mL | INTRAVENOUS | Status: DC | PRN

## 2019-04-03 MED ORDER — CLONIDINE HCL 0.1 MG PO TABS
0.1000 mg | ORAL_TABLET | Freq: Two times a day (BID) | ORAL | Status: DC
  Administered 2019-04-03 – 2019-04-04 (×2): 0.1 mg via ORAL
  Filled 2019-04-03 (×2): qty 1

## 2019-04-03 MED ORDER — SODIUM CHLORIDE 0.9 % IV SOLN: INTRAVENOUS | Status: DC

## 2019-04-03 MED ORDER — SODIUM CHLORIDE 0.9% FLUSH
3.0000 mL | Freq: Two times a day (BID) | INTRAVENOUS | Status: DC
  Administered 2019-04-03 – 2019-04-04 (×3): 3 mL via INTRAVENOUS

## 2019-04-03 MED ORDER — ALPRAZOLAM 0.25 MG PO TABS
0.2500 mg | ORAL_TABLET | Freq: Four times a day (QID) | ORAL | Status: DC | PRN
  Administered 2019-04-03 – 2019-04-07 (×5): 0.25 mg via ORAL
  Filled 2019-04-03 (×5): qty 1

## 2019-04-03 MED ORDER — SODIUM CHLORIDE 0.9 % IV SOLN: 250.0000 mL | INTRAVENOUS | Status: DC

## 2019-04-03 MED ORDER — FUROSEMIDE 20 MG PO TABS: 40.0000 mg | ORAL_TABLET | Freq: Every day | ORAL | Status: DC

## 2019-04-03 MED ORDER — METOPROLOL TARTRATE 5 MG/5ML IV SOLN
2.5000 mg | INTRAVENOUS | Status: DC | PRN
  Administered 2019-04-03: 5 mg via INTRAVENOUS
  Filled 2019-04-03: qty 5

## 2019-04-03 MED ORDER — LOSARTAN POTASSIUM 50 MG PO TABS
50.0000 mg | ORAL_TABLET | Freq: Every day | ORAL | Status: DC
  Administered 2019-04-03 – 2019-04-04 (×2): 50 mg via ORAL
  Filled 2019-04-03 (×2): qty 1

## 2019-04-03 NOTE — Progress Notes (Signed)
Still in flutter with variable rate from 70 to 140.  Does not feel great but has no new specific complaints.  Vitals:   04/02/19 2200 04/03/19 0202 04/03/19 0419 04/03/19 0800  BP: 122/83  (!) 137/103   Pulse: 71  82 (!) 143  Resp: (!) 30  (!) 32   Temp:  98 F (36.7 C)  98.1 F (36.7 C)  TempSrc:  Axillary  Axillary  SpO2: 98%  99% 100%  Weight:      Height:      2 LPM [place]  Gen: NAD HEENT: NCAT, sclerae white Neck: JVP cannot be visualized Lungs: Faint bibasilar crackles, no wheezes Cardiovascular: regular, tachy (2:1), no M noted Abdomen: Obese, soft, nontender, normal BS Ext: NSC trace symmetric lower extremity edema Neuro: No focal deficits Skin: Limited exam, no lesions noted  BMP Latest Ref Rng & Units 04/03/2019 04/01/2019 04/01/2019  Glucose 70 - 99 mg/dL 100(H) 110(H) 117(H)  BUN 6 - 20 mg/dL 23(H) 28(H) 24(H)  Creatinine 0.61 - 1.24 mg/dL 1.41(H) 1.53(H) 1.64(H)  Sodium 135 - 145 mmol/L 137 137 139  Potassium 3.5 - 5.1 mmol/L 3.0(L) 3.3(L) 2.6(LL)  Chloride 98 - 111 mmol/L 104 102 104  CO2 22 - 32 mmol/L 25 24 25   Calcium 8.9 - 10.3 mg/dL 8.6(L) 8.4(L) 8.6(L)   CBC Latest Ref Rng & Units 04/03/2019 04/02/2019 04/01/2019  WBC 4.0 - 10.5 K/uL 7.8 8.9 8.3  Hemoglobin 13.0 - 17.0 g/dL 9.8(L) 9.9(L) 10.9(L)  Hematocrit 39.0 - 52.0 % 31.3(L) 31.4(L) 35.3(L)  Platelets 150 - 400 K/uL 316 308 323   PCT < 0.10 x 2  ECHO 08/15: Left Ventricle: The left ventricle has moderate-severely reduced systolic function, with an ejection fraction of 30-35%. The cavity size was mildly dilated. There is severely increased left ventricular wall thickness. Left ventricular diastolic Doppler parameters are indeterminate. Left ventricular diffuse hypokinesis. Right Ventricle: The right ventricle has normal systolic function. The cavity was normal. There is mildly increased right ventricular wall thickness. Right ventricular systolic pressure is moderately elevated with an estimated pressure of  45.8 mmHg.  CXR: Cardiomegaly with improved edema pattern  IMPRESSION: Acute hypoxemic respiratory failure Acute pulmonary edema Atrial flutter with variable block.  Difficult rate control AKI, nonoliguric Suspect CKD Hypokalemia due to loop diuresis Anemia without overt blood loss  PLAN/REC: Continue supplemental oxygen as needed to maintain SPO2 >92% Cont anticoagulation.  Change to rivaroxaban 8/15 Continue oral diltiazem and carvedilol Continue digoxin 0.25 mg daily Monitor BMET intermittently Monitor I/Os Correct electrolytes as indicated Potassium supplemented 8/16 Planned TEE directed DCCV 8/17  Merton Border, MD PCCM service Mobile [phone] Pager [phone] 04/03/2019 1:11 PM

## 2019-04-03 NOTE — Progress Notes (Signed)
[place] for Rivaroxaban  Indication: atrial fibrillation  No Known Allergies  Patient Measurements: Height: 5\' 11"  (180.3 cm) Weight: 300 lb (136.1 kg) IBW/kg (Calculated) : 75.3  Vital Signs: Temp: 98.1 F (36.7 C) (08/16 0800) Temp Source: Axillary (08/16 0800) BP: 137/103 (08/16 0419) Pulse Rate: 143 (08/16 0800)  Labs: Recent Labs    04/01/19 0603 04/01/19 0827 04/01/19 2100 04/02/19 0003 04/02/19 0634 04/03/19 0414  HGB 10.9*  --   --   --  9.9* 9.8*  HCT 35.3*  --   --   --  31.4* 31.3*  PLT 323  --   --   --  308 316  LABPROT 14.6  --   --   --   --   --   INR 1.2  --   --   --   --   --   HEPARINUNFRC  --   --   --  <0.10*  --   --   CREATININE 1.64*  --  1.53*  --   --  1.41*  TROPONINIHS 298* 298*  --   --   --   --     Estimated Creatinine Clearance: 100.1 mL/min (A) (by C-G formula based on SCr of 1.41 mg/dL (H)).   Medical History: Past Medical History:  Diagnosis Date  . Hypertension   . Polysubstance abuse ([name])     Medications:  Scheduled:  . aspirin EC  81 mg Oral Daily  . atorvastatin  40 mg Oral q1800  . carvedilol  12.5 mg Oral BID WC  . cloNIDine  0.2 mg Oral BID  . digoxin  0.25 mg Oral Daily  . diltiazem  120 mg Oral Q12H  . furosemide  40 mg Intravenous Q12H  . hydrALAZINE  100 mg Oral Q8H  . isosorbide mononitrate  30 mg Oral Daily  . rivaroxaban  20 mg Oral Q supper  . sodium chloride flush  10-40 mL Intracatheter Q12H   Infusions:  . sodium chloride      Assessment: Pharmacy consulted for rivaroxaban dosing for new onset atrial fibrillation. Patient to have TEE/Cardioversion on 8/17.   Goal of Therapy:  Monitor platelets by anticoagulation protocol: Yes   Plan:  Will continue rivaroxaban 20mg  Daily. Will continue to monitor per protocol.   Pharmacy will continue t  Kynlie Jane L 04/03/2019,11:40 AM

## 2019-04-03 NOTE — Progress Notes (Signed)
[place] [facility] [place] NAME: Gerald [name]    MR#:  [id number]  DATE OF BIRTH:  March 18, 1981  SUBJECTIVE:  CHIEF COMPLAINT:   Chief Complaint  Patient presents with  . Shortness of Breath   Off oxygen today.  Continues to have tachycardia.  Feels improved with his breathing.  REVIEW OF SYSTEMS:    Review of Systems  Constitutional: Positive for malaise/fatigue. Negative for chills and fever.  HENT: Negative for sore throat.   Eyes: Negative for blurred vision, double vision and pain.  Respiratory: Positive for cough and shortness of breath. Negative for hemoptysis and wheezing.   Cardiovascular: Positive for orthopnea. Negative for chest pain, palpitations and leg swelling.  Gastrointestinal: Negative for abdominal pain, constipation, diarrhea, heartburn, nausea and vomiting.  Genitourinary: Negative for dysuria and hematuria.  Musculoskeletal: Negative for back pain and joint pain.  Skin: Negative for rash.  Neurological: Negative for sensory change, speech change, focal weakness and headaches.  Endo/Heme/Allergies: Does not bruise/bleed easily.  Psychiatric/Behavioral: Negative for depression. The patient is not nervous/anxious.    DRUG ALLERGIES:  No Known Allergies  VITALS:  Blood pressure (!) 137/103, pulse (!) 143, temperature 98.1 F (36.7 C), temperature source Axillary, resp. rate (!) 32, height 5\' 11"  (1.803 m), weight 136.1 kg, SpO2 100 %.  PHYSICAL EXAMINATION:   Physical Exam  GENERAL:  38 y.o.-year-old patient lying in the bed with no acute distress.  Obese.  Conversational dyspnea EYES: Pupils equal, round, reactive to light and accommodation. No scleral icterus. Extraocular muscles intact.  HEENT: Head atraumatic, normocephalic. Oropharynx and nasopharynx clear.  NECK:  Supple, no jugular venous distention. No thyroid enlargement, no tenderness.  LUNGS: Decreased air entry  CARDIOVASCULAR: S1, S2, tachycardia ABDOMEN:  Soft, nontender, nondistended. Bowel sounds present. No organomegaly or mass.  EXTREMITIES: No cyanosis, clubbing or edema b/l.    NEUROLOGIC: Cranial nerves II through XII are intact. No focal Motor or sensory deficits b/l.   PSYCHIATRIC: The patient is alert and oriented x 3.  SKIN: No obvious rash, lesion, or ulcer.   LABORATORY PANEL:   CBC Recent Labs  Lab 04/03/19 0414  WBC 7.8  HGB 9.8*  HCT 31.3*  PLT 316   ------------------------------------------------------------------------------------------------------------------ Chemistries  Recent Labs  Lab 04/03/19 0414  NA 137  K 3.0*  CL 104  CO2 25  GLUCOSE 100*  BUN 23*  CREATININE 1.41*  CALCIUM 8.6*  MG 1.9  AST 25  ALT 46*  ALKPHOS 97  BILITOT 0.7   ------------------------------------------------------------------------------------------------------------------  Cardiac Enzymes No results for input(s): TROPONINI in the last 168 hours. ------------------------------------------------------------------------------------------------------------------  RADIOLOGY:  Dg Chest Port 1 View  Result Date: 04/03/2019 CLINICAL DATA:  Respiratory failure EXAM: PORTABLE CHEST 1 VIEW COMPARISON:  Yesterday FINDINGS: Marked cardiomegaly.  Vascular pedicle widening. Asymmetric airspace opacity on the right. No definite effusion. No pneumothorax. PICC on the right with tip at the upper SVC. IMPRESSION: Unchanged asymmetric right airspace disease suggesting pneumonia. Electronically Signed   By: Monte Fantasia M.D.   On: 04/03/2019 07:53   Dg Chest Port 1 View  Result Date: 04/02/2019 CLINICAL DATA:  Respiratory failure EXAM: PORTABLE CHEST 1 VIEW COMPARISON:  04/01/2019 FINDINGS: Unchanged cardiomegaly. Patchy airspace opacities, right greater than left, are unchanged. No pleural effusion or pneumothorax. There is a right PICC line now present with tip projecting over the lower SVC. IMPRESSION: Patchy right greater than left  airspace opacities, unchanged, again suggestive of pneumonia. Electronically Signed   By:  Ulyses Jarred M.D.   On: 04/02/2019 06:03   [place] Ekg Site Rite  Result Date: 04/01/2019 If Site Rite image not attached, placement could not be confirmed due to current cardiac rhythm.    ASSESSMENT AND PLAN:   *Acute on chronic systolic congestive heart failure - IV Lasix, Beta blockers - Input and Output---3.3 L - Counseled to limit fluids and Salt - Monitor Bun/Cr and Potassium - Echo-ejection fraction 30 to 35% -Cardiology follow up   Has used cocaine in the past and uncontrolled HTN likely contributed to his cardiomyopathy  *Acute hypoxic respiratory failure secondary to CHF.   Off oxygen today  *Atrial fib/flutter.  Patient is on Coreg, Cardizem, digoxin this time.  Scheduled for cardioversion on Monday.  On anticoagulation. ON Xarelto  *Hypertension  On Coreg, Cardizem, clonidine.  Improved.   All the records are reviewed and case discussed with Care Management/Social Worker Management plans discussed with the patient, family and they are in agreement.  CODE STATUS: FULL CODE  TOTAL TIME TAKING CARE OF THIS PATIENT: 35 minutes.   POSSIBLE D/C IN 1-2 DAYS, DEPENDING ON CLINICAL CONDITION.  Leia Alf Alima Naser M.D on 04/03/2019 at 10:41 AM  Between 7am to 6pm - Pager - [phone]  After 6pm go to [email or web address] - password [email or web address] [place]  Office  [phone]  CC: Primary care physician; Patient, [name]  Note: This dictation was prepared with Dragon dictation along with smaller phrase technology. Any transcriptional errors that result from this process are unintentional.

## 2019-04-03 NOTE — Progress Notes (Signed)
Progress Note  Patient Name: Gerald [name] Date of Encounter: 04/03/2019  Primary Cardiologist: No primary care provider on file.   Subjective   Patient feeling a little bit better.  Breathing slowly improving.  No chest pain at present.  Did not sleep well overnight.  Inpatient Medications    Scheduled Meds: . aspirin EC  81 mg Oral Daily  . atorvastatin  40 mg Oral q1800  . carvedilol  12.5 mg Oral BID WC  . cloNIDine  0.2 mg Oral BID  . digoxin  0.25 mg Oral Daily  . diltiazem  120 mg Oral Q12H  . furosemide  40 mg Intravenous Q12H  . hydrALAZINE  100 mg Oral Q8H  . isosorbide mononitrate  30 mg Oral Daily  . rivaroxaban  20 mg Oral Q supper  . sodium chloride flush  10-40 mL Intracatheter Q12H   Continuous Infusions: . sodium chloride     PRN Meds: sodium chloride, acetaminophen **OR** acetaminophen, ALPRAZolam, metoprolol tartrate, ondansetron **OR** ondansetron (ZOFRAN) IV, sodium chloride flush   Vital Signs    Vitals:   04/02/19 2200 04/03/19 0202 04/03/19 0419 04/03/19 0800  BP: 122/83  (!) 137/103   Pulse: 71  82 (!) 143  Resp: (!) 30  (!) 32   Temp:  98 F (36.7 C)  98.1 F (36.7 C)  TempSrc:  Axillary  Axillary  SpO2: 98%  99% 100%  Weight:      Height:        Intake/Output Summary (Last 24 hours) at 04/03/2019 1240 Last data filed at 04/03/2019 0800 Gross per 24 hour  Intake 924.93 ml  Output 3825 ml  Net -2900.07 ml   Last 3 Weights 04/01/2019 06/27/2018 12/21/2015  Weight (lbs) 300 lb 265 lb 313 lb  Weight (kg) 136.079 kg 120.203 kg 141.976 kg      Telemetry    Patient now in atrial fibrillation, rate controlled with heart rate in the 80s - Personally Reviewed   Physical Exam  Alert, oriented male in no distress GEN: No acute distress.   Neck: No JVD Cardiac:  Irregularly irregular, no murmurs, rubs, or gallops.  Respiratory: Clear to auscultation bilaterally. GI: Soft, nontender, non-distended  MS:  Trace bilateral pretibial  edema; No deformity. Neuro:  Nonfocal  Psych: Normal affect   Labs    High Sensitivity Troponin:   Recent Labs  Lab 04/01/19 0603 04/01/19 0827  TROPONINIHS 298* 298*      Cardiac EnzymesNo results for input(s): TROPONINI in the last 168 hours. No results for input(s): TROPIPOC in the last 168 hours.   Chemistry Recent Labs  Lab 04/01/19 0603 04/01/19 2100 04/03/19 0414  NA 139 137 137  K 2.6* 3.3* 3.0*  CL 104 102 104  CO2 25 24 25   GLUCOSE 117* 110* 100*  BUN 24* 28* 23*  CREATININE 1.64* 1.53* 1.41*  CALCIUM 8.6* 8.4* 8.6*  PROT 6.7  --  6.2*  ALBUMIN 3.1*  --  3.0*  AST 28  --  25  ALT 59*  --  46*  ALKPHOS 106  --  97  BILITOT 1.0  --  0.7  GFRNONAA 52* 57* >60  GFRAA >60 >60 >60  ANIONGAP 10 11 8      Hematology Recent Labs  Lab 04/01/19 0603 04/02/19 0634 04/03/19 0414  WBC 8.3 8.9 7.8  RBC 5.27 4.64 4.63  HGB 10.9* 9.9* 9.8*  HCT 35.3* 31.4* 31.3*  MCV 67.0* 67.7* 67.6*  MCH 20.7* 21.3* 21.2*  MCHC 30.9 31.5 31.3  RDW 17.2* 17.0* 16.8*  PLT 323 308 316    BNP Recent Labs  Lab 04/01/19 0603  BNP 1,059.0*     DDimer No results for input(s): DDIMER in the last 168 hours.   Radiology    Dg Chest Port 1 View  Result Date: 04/03/2019 CLINICAL DATA:  Respiratory failure EXAM: PORTABLE CHEST 1 VIEW COMPARISON:  Yesterday FINDINGS: Marked cardiomegaly.  Vascular pedicle widening. Asymmetric airspace opacity on the right. No definite effusion. No pneumothorax. PICC on the right with tip at the upper SVC. IMPRESSION: Unchanged asymmetric right airspace disease suggesting pneumonia. Electronically Signed   By: Monte Fantasia M.D.   On: 04/03/2019 07:53   Dg Chest Port 1 View  Result Date: 04/02/2019 CLINICAL DATA:  Respiratory failure EXAM: PORTABLE CHEST 1 VIEW COMPARISON:  04/01/2019 FINDINGS: Unchanged cardiomegaly. Patchy airspace opacities, right greater than left, are unchanged. No pleural effusion or pneumothorax. There is a right PICC  line now present with tip projecting over the lower SVC. IMPRESSION: Patchy right greater than left airspace opacities, unchanged, again suggestive of pneumonia. Electronically Signed   By: Ulyses Jarred M.D.   On: 04/02/2019 06:03    Cardiac Studies   2D Echo: IMPRESSIONS    1. The left ventricle has moderate-severely reduced systolic function, with an ejection fraction of 30-35%. The cavity size was mildly dilated. There is severely increased left ventricular wall thickness. Left ventricular diastolic Doppler parameters  are indeterminate. Left ventricular diffuse hypokinesis.  2. The right ventricle has normal systolic function. The cavity was normal. There is mildly increased right ventricular wall thickness. Right ventricular systolic pressure is moderately elevated with an estimated pressure of 45.8 mmHg.  3. Mitral valve regurgitation is moderate  4. Tricuspid valve regurgitation is moderate.  5. Rhythm is atrial flutter, ventricularrate 70 bpm  FINDINGS  Left Ventricle: The left ventricle has moderate-severely reduced systolic function, with an ejection fraction of 30-35%. The cavity size was mildly dilated. There is severely increased left ventricular wall thickness. Left ventricular diastolic Doppler  parameters are indeterminate. Left ventricular diffuse hypokinesis.  Right Ventricle: The right ventricle has normal systolic function. The cavity was normal. There is mildly increased right ventricular wall thickness. Right ventricular systolic pressure is moderately elevated with an estimated pressure of 45.8 mmHg.  Left Atrium: Left atrial size was normal in size.  Right Atrium: Right atrial size was normal in size. Right atrial pressure is estimated at 20 mmHg.  Interatrial Septum: No atrial level shunt detected by color flow Doppler.  Pericardium: There is no evidence of pericardial effusion.  Mitral Valve: The mitral valve is normal in structure. Mitral valve  regurgitation is moderate by color flow Doppler.  Tricuspid Valve: The tricuspid valve is normal in structure. Tricuspid valve regurgitation is moderate by color flow Doppler.  Aortic Valve: The aortic valve is normal in structure. Aortic valve regurgitation was not visualized by color flow Doppler.  Pulmonic Valve: The pulmonic valve was grossly normal. Pulmonic valve regurgitation is mild by color flow Doppler.  Aorta: The aorta is normal unless otherwise noted.  Venous: The inferior vena cava measures 2.70 cm, is normal in size with greater than 50% respiratory variability.   Patient Profile     38 y.o. male with acute combined systolic and diastolic heart failure, new atrial flutter with RVR, now rate-controlled, and acute respiratory failure  Assessment & Plan    1.  Acute combined systolic and diastolic heart failure.  The patient  has severe hypertensive heart disease with marked concentric LVH and global LV systolic dysfunction.  He is slowly improving on his current medical program.  We will continue carvedilol, hydralazine, and Imdur.  We will start to decrease clonidine and will add losartan today.  Would continue diltiazem while he remains in atrial fibrillation, but this would not be a good long-term medication for him in the setting of heart failure. 2.  Atrial flutter, now in atrial fibrillation with controlled ventricular response: Continue oral anticoagulation with rivaroxaban.  In the setting of the patient's congestive heart failure and severe LV dysfunction, I have recommended a TEE guided cardioversion tomorrow.  I have reviewed the risks, indications, and alternatives to both TEE and cardioversion with the patient.  He understands and agrees to proceed.  We will make him n.p.o. after midnight. 3.  Malignant hypertension/hypertensive heart disease: As above.  Start to wean off of clonidine, add losartan, continue other medications at present. 4.  Polysubstance abuse:  Cessation counseling done. 5.  Chronic kidney disease stage III: Renal function stable.  For questions or updates, please contact [place] Please consult [email or web address] for contact info under        Signed, Sherren Mocha, MD  04/03/2019, 12:40 PM

## 2019-04-04 ENCOUNTER — Encounter
Admission: EM | Disposition: A | Discharge status: Home / Self Care | Payer: Self-pay | Source: Home / Self Care | Attending: Internal Medicine

## 2019-04-04 ENCOUNTER — Inpatient Hospital Stay: Payer: Self-pay | Admitting: Anesthesiology

## 2019-04-04 ENCOUNTER — Inpatient Hospital Stay (HOSPITAL_COMMUNITY)
Admit: 2019-04-04 | Discharge: 2019-04-04 | Disposition: A | Discharge status: Home / Self Care | Payer: Self-pay | Attending: Cardiovascular Disease | Admitting: Cardiovascular Disease

## 2019-04-04 ENCOUNTER — Encounter: Payer: Self-pay | Admitting: Cardiovascular Disease

## 2019-04-04 DIAGNOSIS — I4891 Unspecified atrial fibrillation: Secondary | ICD-10-CM

## 2019-04-04 DIAGNOSIS — I5021 Acute systolic (congestive) heart failure: Secondary | ICD-10-CM

## 2019-04-04 DIAGNOSIS — I4819 Other persistent atrial fibrillation: Secondary | ICD-10-CM

## 2019-04-04 DIAGNOSIS — I340 Nonrheumatic mitral (valve) insufficiency: Secondary | ICD-10-CM

## 2019-04-04 DIAGNOSIS — I5041 Acute combined systolic (congestive) and diastolic (congestive) heart failure: Secondary | ICD-10-CM

## 2019-04-04 DIAGNOSIS — I10 Essential (primary) hypertension: Secondary | ICD-10-CM

## 2019-04-04 HISTORY — PX: TEE WITHOUT CARDIOVERSION: SHX5443

## 2019-04-04 LAB — BASIC METABOLIC PANEL
Anion gap: 15 (ref 5–15)
BUN: 25 mg/dL — ABNORMAL HIGH (ref 6–20)
CO2: 26 mmol/L (ref 22–32)
Calcium: 8.6 mg/dL — ABNORMAL LOW (ref 8.9–10.3)
Chloride: 97 mmol/L — ABNORMAL LOW (ref 98–111)
Creatinine, Ser: 1.5 mg/dL — ABNORMAL HIGH (ref 0.61–1.24)
GFR calc Af Amer: >60 mL/min (ref >60)
GFR calc non Af Amer: 58 mL/min — ABNORMAL LOW (ref >60)
Glucose, Bld: 90 mg/dL (ref 70–99)
Potassium: 3.1 mmol/L — ABNORMAL LOW (ref 3.5–5.1)
Sodium: 138 mmol/L (ref 135–145)

## 2019-04-04 LAB — CBC
HCT: 30 % — ABNORMAL LOW (ref 39.0–52.0)
Hemoglobin: 9.4 g/dL — ABNORMAL LOW (ref 13.0–17.0)
MCH: 20.9 pg — ABNORMAL LOW (ref 26.0–34.0)
MCHC: 31.3 g/dL (ref 30.0–36.0)
MCV: 66.8 fL — ABNORMAL LOW (ref 80.0–100.0)
Platelets: 322 10*3/uL (ref 150–400)
RBC: 4.49 MIL/uL (ref 4.22–5.81)
RDW: 16.7 % — ABNORMAL HIGH (ref 11.5–15.5)
WBC: 6.4 10*3/uL (ref 4.0–10.5)
nRBC: 0 % (ref 0.0–0.2)

## 2019-04-04 LAB — MAGNESIUM: Magnesium: 2 mg/dL (ref 1.7–2.4)

## 2019-04-04 LAB — PHOSPHORUS: Phosphorus: 4.5 mg/dL (ref 2.5–4.6)

## 2019-04-04 SURGERY — CARDIOVERSION
Anesthesia: General

## 2019-04-04 SURGERY — ECHOCARDIOGRAM, TRANSESOPHAGEAL
Anesthesia: General

## 2019-04-04 MED ORDER — CLONIDINE HCL 0.1 MG PO TABS
0.1000 mg | ORAL_TABLET | Freq: Every day | ORAL | Status: AC
  Administered 2019-04-05 – 2019-04-07 (×3): 0.1 mg via ORAL
  Filled 2019-04-04 (×3): qty 1

## 2019-04-04 MED ORDER — CHLORHEXIDINE GLUCONATE CLOTH 2 % EX PADS
6.0000 | MEDICATED_PAD | Freq: Every day | CUTANEOUS | Status: DC
  Administered 2019-04-04 – 2019-04-09 (×5): 6 via TOPICAL

## 2019-04-04 MED ORDER — FUROSEMIDE 20 MG PO TABS
40.0000 mg | ORAL_TABLET | Freq: Every day | ORAL | Status: DC
  Administered 2019-04-05 – 2019-04-06 (×2): 40 mg via ORAL
  Filled 2019-04-04 (×2): qty 2

## 2019-04-04 MED ORDER — LOSARTAN POTASSIUM 50 MG PO TABS
100.0000 mg | ORAL_TABLET | Freq: Every day | ORAL | Status: DC
  Administered 2019-04-05 – 2019-04-09 (×5): 100 mg via ORAL
  Filled 2019-04-04 (×5): qty 2

## 2019-04-04 MED ORDER — CARVEDILOL 25 MG PO TABS
25.0000 mg | ORAL_TABLET | Freq: Two times a day (BID) | ORAL | Status: DC
  Administered 2019-04-05 – 2019-04-09 (×9): 25 mg via ORAL
  Filled 2019-04-04 (×7): qty 1
  Filled 2019-04-04: qty 2
  Filled 2019-04-04: qty 1

## 2019-04-04 MED ORDER — LIDOCAINE VISCOUS HCL 2 % MT SOLN
OROMUCOSAL | Status: AC
  Filled 2019-04-04: qty 15

## 2019-04-04 MED ORDER — BUTAMBEN-TETRACAINE-BENZOCAINE 2-2-14 % EX AERO
INHALATION_SPRAY | CUTANEOUS | Status: AC
  Filled 2019-04-04: qty 5

## 2019-04-04 MED ORDER — PROPOFOL 10 MG/ML IV BOLUS
INTRAVENOUS | Status: DC | PRN
  Administered 2019-04-04: 80 mg via INTRAVENOUS
  Administered 2019-04-04: 40 mg via INTRAVENOUS
  Administered 2019-04-04: 50 mg via INTRAVENOUS
  Administered 2019-04-04: 30 mg via INTRAVENOUS

## 2019-04-04 MED ORDER — OXYCODONE HCL 5 MG PO TABS
5.0000 mg | ORAL_TABLET | Freq: Four times a day (QID) | ORAL | Status: DC | PRN
  Administered 2019-04-04 – 2019-04-08 (×8): 5 mg via ORAL
  Filled 2019-04-04 (×10): qty 1

## 2019-04-04 MED ORDER — POTASSIUM CHLORIDE CRYS ER 20 MEQ PO TBCR
40.0000 meq | EXTENDED_RELEASE_TABLET | Freq: Four times a day (QID) | ORAL | Status: AC
  Administered 2019-04-04 (×2): 40 meq via ORAL
  Filled 2019-04-04 (×2): qty 2

## 2019-04-04 MED ORDER — PROPOFOL 500 MG/50ML IV EMUL
INTRAVENOUS | Status: AC
  Filled 2019-04-04: qty 50

## 2019-04-04 MED ORDER — HYDRALAZINE HCL 20 MG/ML IJ SOLN
10.0000 mg | INTRAMUSCULAR | Status: DC | PRN
  Administered 2019-04-05: 20 mg via INTRAVENOUS
  Filled 2019-04-04: qty 1

## 2019-04-04 NOTE — Anesthesia Preprocedure Evaluation (Signed)
Anesthesia Evaluation  Patient identified by MRN, date of birth, ID band Patient awake    Reviewed: Allergy & Precautions, NPO status , Patient's Chart, lab work & pertinent test results  History of Anesthesia Complications Negative for: history of anesthetic complications  Airway Mallampati: III  TM Distance: >3 FB Neck ROM: Full    Dental  (+) Poor Dentition   Pulmonary neg sleep apnea, neg COPD, Current Smoker and Patient abstained from smoking.,    breath sounds clear to auscultation- rhonchi (-) wheezing      Cardiovascular hypertension, Pt. on medications (-) CAD, (-) Past MI, (-) Cardiac Stents and (-) CABG + dysrhythmias Atrial Fibrillation  Rhythm:Regular Rate:Normal - Systolic murmurs and - Diastolic murmurs    Neuro/Psych neg Seizures negative neurological ROS  negative psych ROS   GI/Hepatic negative GI ROS, Neg liver ROS,   Endo/Other  negative endocrine ROSneg diabetes  Renal/GU negative Renal ROS     Musculoskeletal negative musculoskeletal ROS (+)   Abdominal (+) + obese,   Peds  Hematology negative hematology ROS (+)   Anesthesia Other Findings Past Medical History: No date: Hypertension No date: Polysubstance abuse ([name])   Reproductive/Obstetrics                             Anesthesia Physical Anesthesia Plan  ASA: III  Anesthesia Plan: General   Post-op Pain Management:    Induction: Intravenous  PONV Risk Score and Plan: 0 and Propofol infusion  Airway Management Planned: Natural Airway  Additional Equipment:   Intra-op Plan:   Post-operative Plan:   Informed Consent: I have reviewed the patients History and Physical, chart, labs and discussed the procedure including the risks, benefits and alternatives for the proposed anesthesia with the patient or authorized representative who has indicated his/her understanding and acceptance.     Dental advisory  given  Plan Discussed with: CRNA and Anesthesiologist  Anesthesia Plan Comments:         Anesthesia Quick Evaluation

## 2019-04-04 NOTE — Progress Notes (Signed)
Remains in A. fib-flutter with variable rate.  Complains of headache.  Denies dyspnea.  Vitals:   04/04/19 0800 04/04/19 1023 04/04/19 1100 04/04/19 1200  BP: (!) 174/110 136/72 (!) 148/93 130/84  Pulse: (!) 108 (!) 110 98 79  Resp: 20 (!) 23 (!) 23 (!) 35  Temp: (!) 97.5 F (36.4 C)     TempSrc: Oral     SpO2: 97% 93% 99% 94%  Weight:      Height:      2 LPM [place]  Gen: No overt distress HEENT: NCAT, sclerae white Neck: Supple, JVP not visualized Lungs: Bibasilar crackles, no wheezes Cardiovascular: IR IR, tachycardia, no M noted Abdomen: Obese, soft, NT, NABS Ext: Trace symmetric ankle edema Neuro: Cranial nerves intact, moves all extremities Skin: No lesions noted  BMP Latest Ref Rng & Units 04/04/2019 04/03/2019 04/01/2019  Glucose 70 - 99 mg/dL 90 100(H) 110(H)  BUN 6 - 20 mg/dL 25(H) 23(H) 28(H)  Creatinine 0.61 - 1.24 mg/dL 1.50(H) 1.41(H) 1.53(H)  Sodium 135 - 145 mmol/L 138 137 137  Potassium 3.5 - 5.1 mmol/L 3.1(L) 3.0(L) 3.3(L)  Chloride 98 - 111 mmol/L 97(L) 104 102  CO2 22 - 32 mmol/L 26 25 24   Calcium 8.9 - 10.3 mg/dL 8.6(L) 8.6(L) 8.4(L)   CBC Latest Ref Rng & Units 04/04/2019 04/03/2019 04/02/2019  WBC 4.0 - 10.5 K/uL 6.4 7.8 8.9  Hemoglobin 13.0 - 17.0 g/dL 9.4(L) 9.8(L) 9.9(L)  Hematocrit 39.0 - 52.0 % 30.0(L) 31.3(L) 31.4(L)  Platelets 150 - 400 K/uL 322 316 308    ECHO 08/15: Left Ventricle: The left ventricle has moderate-severely reduced systolic function, with an ejection fraction of 30-35%. The cavity size was mildly dilated. There is severely increased left ventricular wall thickness. Left ventricular diastolic Doppler parameters are indeterminate. Left ventricular diffuse hypokinesis. Right Ventricle: The right ventricle has normal systolic function. The cavity was normal. There is mildly increased right ventricular wall thickness. Right ventricular systolic pressure is moderately elevated with an estimated pressure of 45.8 mmHg.  CXR: No new  film  IMPRESSION: Acute hypoxemic respiratory failure due to acute pulmonary edema, resolving Atrial fib-flutter with variable block.  Difficult rate control Severe hypertension Hypertensive cardiomyopathy with severe LVH and LVEF 35% AKI, nonoliguric Suspect CKD Hypokalemia due to loop diuresis Anemia without overt blood loss  PLAN/REC: Continue supplemental oxygen as needed to maintain SPO2 >92% Cont anticoagulation.  Changed to rivaroxaban 8/15 Continue oral diltiazem and carvedilol Continue digoxin 0.25 mg daily Continue current antihypertensive regimen Hydralazine as needed to maintain SBP <160 mmHg Monitor BMET intermittently Monitor I/Os Correct electrolytes as indicated Potassium supplemented 8/17 Planned TEE directed DCCV today We will keep in SDU today Might require further evaluation of refractory hypertension (e.g. evaluation for RAS)  Merton Border, MD PCCM service Mobile [phone] Pager [phone] 04/04/2019 12:30 PM

## 2019-04-04 NOTE — Progress Notes (Signed)
ANTICOAGULATION CONSULT NOTE  Pharmacy Consult for Rivaroxaban  Indication: atrial fibrillation  No Known Allergies  Patient Measurements: Height: 5\' 11"  (180.3 cm) Weight: (!) 300 lb 0.7 oz (136.1 kg) IBW/kg (Calculated) : 75.3  Vital Signs: Temp: 97.9 F (36.6 C) (08/17 1259) Temp Source: Oral (08/17 1259) BP: 147/98 (08/17 1415) Pulse Rate: 73 (08/17 1415)  Labs: Recent Labs    04/01/19 2100 04/02/19 0003  04/02/19 0634 04/03/19 0414 04/04/19 0512  HGB  --   --    < > 9.9* 9.8* 9.4*  HCT  --   --   --  31.4* 31.3* 30.0*  PLT  --   --   --  308 316 322  HEPARINUNFRC  --  <0.10*  --   --   --   --   CREATININE 1.53*  --   --   --  1.41* 1.50*   < > = values in this interval not displayed.    Estimated Creatinine Clearance: 94.1 mL/min (A) (by C-G formula based on SCr of 1.5 mg/dL (H)).   Medical History: Past Medical History:  Diagnosis Date  . Hypertension   . Polysubstance abuse ([name])     Medications:  Scheduled:  . aspirin EC  81 mg Oral Daily  . atorvastatin  40 mg Oral q1800  . carvedilol  12.5 mg Oral BID WC  . Chlorhexidine Gluconate Cloth  6 each Topical Q0600  . cloNIDine  0.1 mg Oral BID  . digoxin  0.25 mg Oral Daily  . diltiazem  120 mg Oral Q12H  . [START ON 04/05/2019] furosemide  40 mg Oral Daily  . hydrALAZINE  100 mg Oral Q8H  . isosorbide mononitrate  30 mg Oral Daily  . losartan  50 mg Oral Daily  . rivaroxaban  20 mg Oral Q supper  . sodium chloride flush  10-40 mL Intracatheter Q12H   Infusions:  . sodium chloride      Assessment: Pharmacy consulted for rivaroxaban dosing for new onset atrial fibrillation. Patient underwent cardioversion on 8/17.   Goal of Therapy:  Monitor platelets by anticoagulation protocol: Yes   Plan:  Will continue rivaroxaban 20mg  Daily. Will continue to monitor per protocol.   Pharmacy will continue to monitor and adjust per consult.   Donnica Jarnagin L 04/04/2019,3:15 PM

## 2019-04-04 NOTE — Anesthesia Postprocedure Evaluation (Signed)
Anesthesia Post Note  Patient: ADREON WYKES II  Procedure(s) Performed: TRANSESOPHAGEAL ECHOCARDIOGRAM (TEE) (N/A )  Patient location during evaluation: Other (specials recovery) Anesthesia Type: General Level of consciousness: awake and alert and oriented Pain management: pain level controlled Vital Signs Assessment: post-procedure vital signs reviewed and stable Respiratory status: spontaneous breathing, nonlabored ventilation and respiratory function stable Cardiovascular status: blood pressure returned to baseline and stable Postop Assessment: no signs of nausea or vomiting Anesthetic complications: no     Last Vitals:  Vitals:   04/04/19 1400 04/04/19 1415  BP: (!) 164/102 (!) 147/98  Pulse: 76 73  Resp: (!) 25 (!) 26  Temp:    SpO2: 95% 95%    Last Pain:  Vitals:   04/04/19 1415  TempSrc:   PainSc: 0-No pain                 Lorriane Dehart

## 2019-04-04 NOTE — Anesthesia Post-op Follow-up Note (Signed)
Anesthesia QCDR form completed.        

## 2019-04-04 NOTE — Progress Notes (Signed)
EKG shown to Cardiology and patient waiting for cardioversion.

## 2019-04-04 NOTE — Transfer of Care (Signed)
Immediate Anesthesia Transfer of Care Note  Patient: Gerald [name]  Procedure(s) Performed: TRANSESOPHAGEAL ECHOCARDIOGRAM (TEE) (N/A )  Patient Location: Nursing Unit  Anesthesia Type:General  Level of Consciousness: drowsy and patient cooperative  Airway & Oxygen Therapy: Patient Spontanous Breathing and Patient connected to nasal cannula oxygen  Post-op Assessment: Report given to RN and Post -op Vital signs reviewed and stable  Post vital signs: Reviewed and stable  Last Vitals:  Vitals Value Taken Time  BP 153/88 04/04/19 1338  Temp    Pulse 78 04/04/19 1338  Resp 25 04/04/19 1338  SpO2 99 % 04/04/19 1338  Vitals shown include unvalidated device data.  Last Pain:  Vitals:   04/04/19 1259  TempSrc: Oral  PainSc: 0-No pain         Complications: No apparent anesthesia complications

## 2019-04-04 NOTE — TOC Initial Note (Addendum)
Transition of Care [facility]) - Initial/Assessment Note    Patient Details  Name: Gerald [name] MRN: [id number] Date of Birth: 12-24-1980  [facility] CM/SW Contact:    Candie Chroman, LCSW Phone Number: 04/04/2019, 3:00 PM  Clinical Narrative: CSW met with patient and introduced role. Patient confirmed he has no PCP or insurance. CSW provided booklet for free and low cost healthcare in [place]. Patient unable to afford his medications. Will follow for medication needs at discharge. Patient works full time. Patient asked about food stamps but CSW explained he has to go to DSS for that. He also asked about assistance with housing. CSW will provide phone numbers for [employer] and [employer]. Patient stated he currently lives with his aunt. No further concerns. CSW encouraged patient to contact CSW as needed. CSW will continue to follow patient for support and facilitate return home once stable.   Expected Discharge Plan: Home/Self Care Barriers to Discharge: Continued Medical Work up   Patient Goals and CMS Choice        Expected Discharge Plan and Services Expected Discharge Plan: Home/Self Care     Post Acute Care Choice: NA Living arrangements for the past 2 months: Single Family Home                                      Prior Living Arrangements/Services Living arrangements for the past 2 months: Single Family Home Lives with:: Relatives(Aunt) Patient language and need for interpreter reviewed:: Yes Do you feel safe going back to the place where you live?: Yes      Need for Family Participation in Patient Care: Yes (Comment) Care giver support system in place?: Yes (comment)   Criminal Activity/Legal Involvement Pertinent to Current Situation/Hospitalization: No - Comment as needed  Activities of Daily Living Home Assistive Devices/Equipment: None ADL Screening (condition at time of admission) Patient's cognitive ability  adequate to safely complete daily activities?: Yes Is the patient deaf or have difficulty hearing?: No Does the patient have difficulty seeing, even when wearing glasses/contacts?: No Does the patient have difficulty concentrating, remembering, or making decisions?: No Patient able to express need for assistance with ADLs?: Yes Does the patient have difficulty dressing or bathing?: No Independently performs ADLs?: Yes (appropriate for developmental age) Does the patient have difficulty walking or climbing stairs?: No Weakness of Legs: None Weakness of Arms/Hands: None  Permission Sought/Granted                  Emotional Assessment Appearance:: Appears stated age Attitude/Demeanor/Rapport: Engaged, Gracious Affect (typically observed): Accepting, Appropriate, Calm, Pleasant Orientation: : Oriented to Self, Oriented to Place, Oriented to  Time, Oriented to Situation Alcohol / Substance Use: Never Used Psych Involvement: No (comment)  Admission diagnosis:  Acute respiratory failure ([name]) [J96.00] Malignant hypertension [I10] Hypokalemia [E87.6] Elevated troponin [R79.89] Acute respiratory failure, unspecified whether with hypoxia or hypercapnia ([place]) [J96.00] Community acquired pneumonia, unspecified laterality [J18.9] Patient Active Problem List   Diagnosis Date Noted  . Persistent atrial fibrillation   . Acute respiratory failure ([place]) 04/01/2019  . NSTEMI (non-ST elevated myocardial infarction) ([place]) 12/21/2015  . Community acquired pneumonia 12/21/2015   PCP:  Patient, [name] Pharmacy:   [place], [place] - [address] [address] Phone: [phone] Fax: [phone]     Social Determinants of Health (SDOH) Interventions  Readmission Risk Interventions No flowsheet data found.

## 2019-04-04 NOTE — Progress Notes (Signed)
*  PRELIMINARY RESULTS* Echocardiogram Echocardiogram Transesophageal has been performed.  Gerald [name] 04/04/2019, 1:32 PM

## 2019-04-04 NOTE — Progress Notes (Signed)
[facility] [place] NAME: Gerald [name]    MR#:  [id number]  DATE OF BIRTH:  1981-07-19  SUBJECTIVE:  CHIEF COMPLAINT:   Chief Complaint  Patient presents with  . Shortness of Breath   Afebrile.  Heart rate improved but still irregular.  On room air.  REVIEW OF SYSTEMS:    Review of Systems  Constitutional: Positive for malaise/fatigue. Negative for chills and fever.  HENT: Negative for sore throat.   Eyes: Negative for blurred vision, double vision and pain.  Respiratory: Positive for cough and shortness of breath. Negative for hemoptysis and wheezing.   Cardiovascular: Positive for orthopnea. Negative for chest pain, palpitations and leg swelling.  Gastrointestinal: Negative for abdominal pain, constipation, diarrhea, heartburn, nausea and vomiting.  Genitourinary: Negative for dysuria and hematuria.  Musculoskeletal: Negative for back pain and joint pain.  Skin: Negative for rash.  Neurological: Negative for sensory change, speech change, focal weakness and headaches.  Endo/Heme/Allergies: Does not bruise/bleed easily.  Psychiatric/Behavioral: Negative for depression. The patient is not nervous/anxious.    DRUG ALLERGIES:  No Known Allergies  VITALS:  Blood pressure 130/84, pulse 79, temperature (!) 97.5 F (36.4 C), temperature source Oral, resp. rate (!) 35, height 5\' 11"  (1.803 m), weight 136.1 kg, SpO2 94 %.  PHYSICAL EXAMINATION:   Physical Exam  GENERAL:  38 y.o.-year-old patient lying in the bed with no acute distress.  Obese.  Conversational dyspnea EYES: Pupils equal, round, reactive to light and accommodation. No scleral icterus. Extraocular muscles intact.  HEENT: Head atraumatic, normocephalic. Oropharynx and nasopharynx clear.  NECK:  Supple, no jugular venous distention. No thyroid enlargement, no tenderness.  LUNGS: Decreased air entry  CARDIOVASCULAR: Irregularly irregular ABDOMEN: Soft, nontender,  nondistended. Bowel sounds present. No organomegaly or mass.  EXTREMITIES: No cyanosis, clubbing or edema b/l.    NEUROLOGIC: Cranial nerves II through XII are intact. No focal Motor or sensory deficits b/l.   PSYCHIATRIC: The patient is alert and oriented x 3.  SKIN: No obvious rash, lesion, or ulcer.   LABORATORY PANEL:   CBC Recent Labs  Lab 04/04/19 0512  WBC 6.4  HGB 9.4*  HCT 30.0*  PLT 322   ------------------------------------------------------------------------------------------------------------------ Chemistries  Recent Labs  Lab 04/03/19 0414 04/04/19 0512  NA 137 138  K 3.0* 3.1*  CL 104 97*  CO2 25 26  GLUCOSE 100* 90  BUN 23* 25*  CREATININE 1.41* 1.50*  CALCIUM 8.6* 8.6*  MG 1.9 2.0  AST 25  --   ALT 46*  --   ALKPHOS 97  --   BILITOT 0.7  --    ------------------------------------------------------------------------------------------------------------------  Cardiac Enzymes No results for input(s): TROPONINI in the last 168 hours. ------------------------------------------------------------------------------------------------------------------  RADIOLOGY:  Dg Chest Port 1 View  Result Date: 04/03/2019 CLINICAL DATA:  Respiratory failure EXAM: PORTABLE CHEST 1 VIEW COMPARISON:  Yesterday FINDINGS: Marked cardiomegaly.  Vascular pedicle widening. Asymmetric airspace opacity on the right. No definite effusion. No pneumothorax. PICC on the right with tip at the upper SVC. IMPRESSION: Unchanged asymmetric right airspace disease suggesting pneumonia. Electronically Signed   By: Monte Fantasia M.D.   On: 04/03/2019 07:53   ASSESSMENT AND PLAN:   *Acute on chronic systolic congestive heart failure - IV Lasix, Beta blockers - Input and Output---5800 ml - Counseled to limit fluids and Salt - Monitor Bun/Cr and Potassium - Echo-ejection fraction 30 to 35% -Cardiology follow up.   Has used cocaine in the past and uncontrolled HTN  likely contributed to  his cardiomyopathy  *Acute hypoxic respiratory failure secondary to CHF   Resolved  *Atrial fib/flutter.  Patient is on Coreg, Cardizem, digoxin this time.  Scheduled for cardioversion later today.  On anticoagulation. On Xarelto.  *Hypertension  On Coreg, Cardizem, clonidine.  Improved.  All the records are reviewed and case discussed with Care Management/Social Worker Management plans discussed with the patient, family and they are in agreement.  CODE STATUS: FULL CODE  TOTAL TIME TAKING CARE OF THIS PATIENT: 35 minutes.   POSSIBLE D/C IN 1-2 DAYS, DEPENDING ON CLINICAL CONDITION.  Leia Alf Albi Rappaport M.D on 04/04/2019 at 12:29 PM  Between 7am to 6pm - Pager - [phone]  After 6pm go to [email or web address] - password [email or web address] [place]  Office  [phone]  CC: Primary care physician; Patient, [name]  Note: This dictation was prepared with Dragon dictation along with smaller phrase technology. Any transcriptional errors that result from this process are unintentional.

## 2019-04-04 NOTE — CV Procedure (Signed)
Cardioversion note: A standard informed consent was obtained. Timeout was performed. The pads were placed in the anterior posterior fashion. The patient was given propofol by the anesthesia team.  TEE was performed which showed no evidence of intracardiac thrombus. Successful cardioversion was performed with a 200 J. The patient converted to sinus rhythm. Pre-and post EKGs were reviewed. The patient tolerated the procedure with no immediate complications.  Recommendations: Continue anticoagulation with Xarelto.  Discontinue diltiazem and continue other heart failure medications.

## 2019-04-04 NOTE — Progress Notes (Signed)
Progress Note  Patient Name: Gerald [name] Date of Encounter: 04/04/2019  Primary Cardiologist: new to [facility] - consult by Goll[place]  Subjective   Hungry this morning. For TEE/DCCV this afternoon. No chest pain, SOB or palpitations. "My chest feels great." He remains in Afib with ventricular rates in the 80s to low 100s bpm. Potassium remains low at 3.1. Renal function remains mildly elevated, though stable. HGB low, though stable.   Inpatient Medications    Scheduled Meds: . aspirin EC  81 mg Oral Daily  . atorvastatin  40 mg Oral q1800  . carvedilol  12.5 mg Oral BID WC  . Chlorhexidine Gluconate Cloth  6 each Topical Q0600  . cloNIDine  0.1 mg Oral BID  . digoxin  0.25 mg Oral Daily  . diltiazem  120 mg Oral Q12H  . [START ON 04/05/2019] furosemide  40 mg Oral Daily  . hydrALAZINE  100 mg Oral Q8H  . isosorbide mononitrate  30 mg Oral Daily  . losartan  50 mg Oral Daily  . potassium chloride  40 mEq Oral Q6H  . rivaroxaban  20 mg Oral Q supper  . sodium chloride flush  10-40 mL Intracatheter Q12H  . sodium chloride flush  3 mL Intravenous Q12H   Continuous Infusions: . sodium chloride    . sodium chloride Stopped (04/03/19 1727)  . sodium chloride Stopped (04/03/19 1727)   PRN Meds: sodium chloride, acetaminophen **OR** acetaminophen, ALPRAZolam, hydrALAZINE, metoprolol tartrate, ondansetron **OR** ondansetron (ZOFRAN) IV, oxyCODONE, sodium chloride flush, sodium chloride flush   Vital Signs    Vitals:   04/03/19 2215 04/04/19 0205 04/04/19 0800 04/04/19 1023  BP: (!) 129/98  (!) 174/110 136/72  Pulse: 80  (!) 108 (!) 110  Resp: (!) 23  20 (!) 23  Temp:  98.2 F (36.8 C) (!) 97.5 F (36.4 C)   TempSrc:  Axillary Oral   SpO2: 100%  97% 93%  Weight:      Height:        Intake/Output Summary (Last 24 hours) at 04/04/2019 1155 Last data filed at 04/04/2019 0521 Gross per 24 hour  Intake -  Output 1625 ml  Net -1625 ml   Filed Weights   04/01/19 0549   Weight: 136.1 kg    Telemetry    Afib, 80s to low 100s bpm, occasional PVCs, rare ventricular couplet - Personally Reviewed  ECG    Afib, 88 bpm, early repolarization abnormality, nonspecific st/t changes - Personally Reviewed  Physical Exam   GEN: No acute distress.   Neck: No JVD. Cardiac: Irregularly irregular, no murmurs, rubs, or gallops.  Respiratory: Clear to auscultation bilaterally.  GI: Soft, nontender, non-distended.   MS: No edema; No deformity. Neuro:  Alert and oriented x 3; Nonfocal.  Psych: Normal affect.  Labs    Chemistry Recent Labs  Lab 04/01/19 0603 04/01/19 2100 04/03/19 0414 04/04/19 0512  NA 139 137 137 138  K 2.6* 3.3* 3.0* 3.1*  CL 104 102 104 97*  CO2 25 24 25 26   GLUCOSE 117* 110* 100* 90  BUN 24* 28* 23* 25*  CREATININE 1.64* 1.53* 1.41* 1.50*  CALCIUM 8.6* 8.4* 8.6* 8.6*  PROT 6.7  --  6.2*  --   ALBUMIN 3.1*  --  3.0*  --   AST 28  --  25  --   ALT 59*  --  46*  --   ALKPHOS 106  --  97  --   BILITOT 1.0  --  0.7  --   GFRNONAA 52* 57* >60 58*  GFRAA >60 >60 >60 >60  ANIONGAP 10 11 8 15      Hematology Recent Labs  Lab 04/02/19 0634 04/03/19 0414 04/04/19 0512  WBC 8.9 7.8 6.4  RBC 4.64 4.63 4.49  HGB 9.9* 9.8* 9.4*  HCT 31.4* 31.3* 30.0*  MCV 67.7* 67.6* 66.8*  MCH 21.3* 21.2* 20.9*  MCHC 31.5 31.3 31.3  RDW 17.0* 16.8* 16.7*  PLT 308 316 322    Cardiac EnzymesNo results for input(s): TROPONINI in the last 168 hours. No results for input(s): TROPIPOC in the last 168 hours.   BNP Recent Labs  Lab 04/01/19 0603  BNP 1,059.0*     DDimer No results for input(s): DDIMER in the last 168 hours.   Radiology    Dg Chest Port 1 View  Result Date: 04/03/2019 CLINICAL DATA:  Respiratory failure EXAM: PORTABLE CHEST 1 VIEW COMPARISON:  Yesterday FINDINGS: Marked cardiomegaly.  Vascular pedicle widening. Asymmetric airspace opacity on the right. No definite effusion. No pneumothorax. PICC on the right with tip at the  upper SVC. IMPRESSION: Unchanged asymmetric right airspace disease suggesting pneumonia. Electronically Signed   By: Monte Fantasia M.D.   On: 04/03/2019 07:53    Cardiac Studies   2D Echo: IMPRESSIONS   1. The left ventricle has moderate-severely reduced systolic function, with an ejection fraction of 30-35%. The cavity size was mildly dilated. There is severely increased left ventricular wall thickness. Left ventricular diastolic Doppler parameters  are indeterminate. Left ventricular diffuse hypokinesis. 2. The right ventricle has normal systolic function. The cavity was normal. There is mildly increased right ventricular wall thickness. Right ventricular systolic pressure is moderately elevated with an estimated pressure of 45.8 mmHg. 3. Mitral valve regurgitation is moderate 4. Tricuspid valve regurgitation is moderate. 5. Rhythm is atrial flutter, ventricularrate 70 bpm  FINDINGS Left Ventricle: The left ventricle has moderate-severely reduced systolic function, with an ejection fraction of 30-35%. The cavity size was mildly dilated. There is severely increased left ventricular wall thickness. Left ventricular diastolic Doppler  parameters are indeterminate. Left ventricular diffuse hypokinesis.  Right Ventricle: The right ventricle has normal systolic function. The cavity was normal. There is mildly increased right ventricular wall thickness. Right ventricular systolic pressure is moderately elevated with an estimated pressure of 45.8 mmHg.  Left Atrium: Left atrial size was normal in size.  Right Atrium: Right atrial size was normal in size. Right atrial pressure is estimated at 20 mmHg.  Interatrial Septum: No atrial level shunt detected by color flow Doppler.  Pericardium: There is no evidence of pericardial effusion.  Mitral Valve: The mitral valve is normal in structure. Mitral valve regurgitation is moderate by color flow Doppler.  Tricuspid Valve: The  tricuspid valve is normal in structure. Tricuspid valve regurgitation is moderate by color flow Doppler.  Aortic Valve: The aortic valve is normal in structure. Aortic valve regurgitation was not visualized by color flow Doppler.  Pulmonic Valve: The pulmonic valve was grossly normal. Pulmonic valve regurgitation is mild by color flow Doppler.  Aorta: The aorta is normal unless otherwise noted.  Venous: The inferior vena cava measures 2.70 cm, is normal in size with greater than 50% respiratory variability.  Patient Profile     38 y.o. male with history of acute combined systolic and diastolic heart failure, new atrial flutter with RVR, now rate-controlled, and acute respiratory failure.  Assessment & Plan    1. Acute combined systolic and diastolic CHF: -Likely in  the setting of severe hypertensive heart disease with  marked concentric LVH and global LV systolic dysfunction -Slowly improving -Continue Coreg, hydralazine, Imdur, Lasix, and losartan -Recommend continuing taper of clonidine and discontinuation of diltiazem moving forward with escalation of evidence based therapy -No symptoms concerning for angina -Following diuresis and restoration of sinus rhythm, recommend repeating echo to evaluate for improvement in LVSF -He will need an ischemic evaluation moving forward  2. Atrial flutter/fib: -He remains in Afib with controlled ventricular response -He is for TEE-guided DCCV this afternoon -As long as there is no LAA thrombus and if DCCV is successful, recommend stopping digoxin and diltiazem with escalation of Coreg  -Xarelto -Risks and benefits of TEE/DCCV discussed in detail  3. Malignant hypertension with hypertensive heart disease: -BP is much improved -Continue current regimen with recommendation to consolidate regimen into evidence-based heart failure therapy as above  4. CKD stage III: -Stable -Estimated Creatinine Clearance: 94.1 mL/min (A) (by C-G formula  based on SCr of 1.5 mg/dL (H)).  5. Polysubstance abuse: -Complete cessation advised   For questions or updates, please contact [place] Please consult [email or web address] for contact info under Cardiology/STEMI.    Signed, Christell Faith, PA-C [facility] Pager: [phone] 04/04/2019, 11:55 AM

## 2019-04-05 ENCOUNTER — Inpatient Hospital Stay: Payer: Self-pay

## 2019-04-05 DIAGNOSIS — I10 Essential (primary) hypertension: Secondary | ICD-10-CM

## 2019-04-05 DIAGNOSIS — I5043 Acute on chronic combined systolic (congestive) and diastolic (congestive) heart failure: Secondary | ICD-10-CM

## 2019-04-05 DIAGNOSIS — I4892 Unspecified atrial flutter: Secondary | ICD-10-CM

## 2019-04-05 LAB — CBC
HCT: 32 % — ABNORMAL LOW (ref 39.0–52.0)
Hemoglobin: 9.9 g/dL — ABNORMAL LOW (ref 13.0–17.0)
MCH: 20.7 pg — ABNORMAL LOW (ref 26.0–34.0)
MCHC: 30.9 g/dL (ref 30.0–36.0)
MCV: 66.9 fL — ABNORMAL LOW (ref 80.0–100.0)
Platelets: 335 10*3/uL (ref 150–400)
RBC: 4.78 MIL/uL (ref 4.22–5.81)
RDW: 16.7 % — ABNORMAL HIGH (ref 11.5–15.5)
WBC: 5.6 10*3/uL (ref 4.0–10.5)
nRBC: 0 % (ref 0.0–0.2)

## 2019-04-05 LAB — BASIC METABOLIC PANEL
Anion gap: 8 (ref 5–15)
BUN: 20 mg/dL (ref 6–20)
CO2: 24 mmol/L (ref 22–32)
Calcium: 8.7 mg/dL — ABNORMAL LOW (ref 8.9–10.3)
Chloride: 105 mmol/L (ref 98–111)
Creatinine, Ser: 1.36 mg/dL — ABNORMAL HIGH (ref 0.61–1.24)
GFR calc Af Amer: >60 mL/min (ref >60)
GFR calc non Af Amer: >60 mL/min (ref >60)
Glucose, Bld: 99 mg/dL (ref 70–99)
Potassium: 3.4 mmol/L — ABNORMAL LOW (ref 3.5–5.1)
Sodium: 137 mmol/L (ref 135–145)

## 2019-04-05 MED ORDER — HYDRALAZINE HCL 20 MG/ML IJ SOLN
10.0000 mg | INTRAMUSCULAR | Status: DC | PRN
  Administered 2019-04-05 (×2): 10 mg via INTRAVENOUS
  Administered 2019-04-06 – 2019-04-07 (×2): 20 mg via INTRAVENOUS
  Filled 2019-04-05 (×4): qty 1

## 2019-04-05 MED ORDER — POTASSIUM CHLORIDE CRYS ER 20 MEQ PO TBCR
40.0000 meq | EXTENDED_RELEASE_TABLET | Freq: Four times a day (QID) | ORAL | Status: AC
  Administered 2019-04-05 (×2): 40 meq via ORAL
  Filled 2019-04-05 (×2): qty 2

## 2019-04-05 MED ORDER — SPIRONOLACTONE 25 MG PO TABS
50.0000 mg | ORAL_TABLET | Freq: Every day | ORAL | Status: DC
  Administered 2019-04-05 – 2019-04-09 (×5): 50 mg via ORAL
  Filled 2019-04-05 (×5): qty 2

## 2019-04-05 NOTE — Progress Notes (Signed)
S/P successful DCCV 08/17. Remains in NSR now. Still with HA  Vitals:   04/05/19 0400 04/05/19 0512 04/05/19 0800 04/05/19 1139  BP: (!) 179/102 (!) 172/109 (!) 183/101 140/89  Pulse: 84  85 76  Resp: 17  12 (!) 32  Temp: 97.6 F (36.4 C)  98.8 F (37.1 C)   TempSrc: Oral  Oral   SpO2: 99%  98% 98%  Weight:      Height:      2 LPM [place]  Gen: NAD HEENT: NCAT, sclerae white Neck: No JVD Lungs: breath sounds full, no wheezes or other adventitious sounds Cardiovascular: RRR, no murmurs Abdomen: Soft, nontender, normal BS Ext: without clubbing, cyanosis, edema Neuro: grossly intact Skin: Limited exam, no lesions noted   BMP Latest Ref Rng & Units 04/05/2019 04/04/2019 04/03/2019  Glucose 70 - 99 mg/dL 99 90 100(H)  BUN 6 - 20 mg/dL 20 25(H) 23(H)  Creatinine 0.61 - 1.24 mg/dL 1.36(H) 1.50(H) 1.41(H)  Sodium 135 - 145 mmol/L 137 138 137  Potassium 3.5 - 5.1 mmol/L 3.4(L) 3.1(L) 3.0(L)  Chloride 98 - 111 mmol/L 105 97(L) 104  CO2 22 - 32 mmol/L 24 26 25   Calcium 8.9 - 10.3 mg/dL 8.7(L) 8.6(L) 8.6(L)   CBC Latest Ref Rng & Units 04/05/2019 04/04/2019 04/03/2019  WBC 4.0 - 10.5 K/uL 5.6 6.4 7.8  Hemoglobin 13.0 - 17.0 g/dL 9.9(L) 9.4(L) 9.8(L)  Hematocrit 39.0 - 52.0 % 32.0(L) 30.0(L) 31.3(L)  Platelets 150 - 400 K/uL 335 322 316    ECHO 08/15: Left Ventricle: The left ventricle has moderate-severely reduced systolic function, with an ejection fraction of 30-35%. The cavity size was mildly dilated. There is severely increased left ventricular wall thickness. Left ventricular diastolic Doppler parameters are indeterminate. Left ventricular diffuse hypokinesis. Right Ventricle: The right ventricle has normal systolic function. The cavity was normal. There is mildly increased right ventricular wall thickness. Right ventricular systolic pressure is moderately elevated with an estimated pressure of 45.8 mmHg.  CXR: continued improvement in edema pattern  IMPRESSION: Acute hypoxemic  respiratory failure due to acute pulmonary edema, resolving Atrial fib-flutter with variable block. S/P DCCV 08/17 Severe hypertension - very difficult to control Hypertensive cardiomyopathy with severe LVH and LVEF 35% AKI on likely CKD, nonoliguric. Cr stable to improved Hypokalemia due to loop diuresis Anemia without overt blood loss  PLAN/REC: Continue supplemental oxygen as needed to maintain SPO2 >92% Cont rivaroxaban -initiated 8/15 Continue carvedilol, hydralazine, isosorbide mononitrate, losartan Cardiology weaning clonidine to off Add spironolactone 50 mg daily 8/18 Continue IV hydralazine as needed to maintain SBP <160 mmHg Monitor BMET intermittently Monitor I/Os Correct electrolytes as indicated Potassium supplemented 8/18 Will discuss with cardiology whether any further evaluation of refractory hypertension is warranted Transfer to telemetry floor 8/18  After transfer, PCCM will sign off. Please call if we can be of further assistance  Merton Border, MD PCCM service Mobile [phone] Pager [phone] 04/05/2019 1:02 PM

## 2019-04-05 NOTE — Progress Notes (Signed)
Progress Note  Patient Name: Gerald [name] Date of Encounter: 04/05/2019  Primary Cardiologist: Rockey Situ  Subjective   Patient feels better today with fewer palpitations and less shortness of breath.  Still fatigued and weak.  No chest pain.  Headache has also improved but not completely resolved.  Inpatient Medications    Scheduled Meds: . aspirin EC  81 mg Oral Daily  . atorvastatin  40 mg Oral q1800  . carvedilol  25 mg Oral BID WC  . Chlorhexidine Gluconate Cloth  6 each Topical Q0600  . cloNIDine  0.1 mg Oral Daily  . furosemide  40 mg Oral Daily  . hydrALAZINE  100 mg Oral Q8H  . isosorbide mononitrate  30 mg Oral Daily  . losartan  100 mg Oral Daily  . rivaroxaban  20 mg Oral Q supper  . sodium chloride flush  10-40 mL Intracatheter Q12H  . spironolactone  50 mg Oral Daily   Continuous Infusions: . sodium chloride     PRN Meds: sodium chloride, acetaminophen **OR** acetaminophen, ALPRAZolam, hydrALAZINE, metoprolol tartrate, ondansetron **OR** ondansetron (ZOFRAN) IV, oxyCODONE, sodium chloride flush   Vital Signs    Vitals:   04/05/19 1139 04/05/19 1200 04/05/19 1300 04/05/19 1353  BP: 140/89 (!) 145/91 (!) 156/90 (!) 159/100  Pulse: 76 76 77 73  Resp: (!) 32 (!) 30 (!) 22   Temp:    (!) 97.5 F (36.4 C)  TempSrc:    Oral  SpO2: 98% 99% 96% 100%  Weight:    125.6 kg  Height:    5\' 11"  (1.803 m)    Intake/Output Summary (Last 24 hours) at 04/05/2019 1534 Last data filed at 04/05/2019 1300 Gross per 24 hour  Intake 840 ml  Output 2850 ml  Net -2010 ml   Last 3 Weights 04/05/2019 04/04/2019 04/01/2019  Weight (lbs) 276 lb 14.4 oz 300 lb 0.7 oz 300 lb  Weight (kg) 125.601 kg 136.1 kg 136.079 kg      Telemetry    Normal sinus rhythm - Personally Reviewed  ECG    No new tracing.  Physical Exam   GEN: No acute distress.   Neck:  JVP difficult to assess. Cardiac: RRR, no murmurs, rubs, or gallops.  Respiratory: Clear to auscultation bilaterally.  GI: Soft, nontender, non-distended  MS:  Trace ankle edema; No deformity. Neuro:  Nonfocal  Psych: Normal affect   Labs    High Sensitivity Troponin:   Recent Labs  Lab 04/01/19 0603 04/01/19 0827  TROPONINIHS 298* 298*      Cardiac EnzymesNo results for input(s): TROPONINI in the last 168 hours. No results for input(s): TROPIPOC in the last 168 hours.   Chemistry Recent Labs  Lab 04/01/19 0603  04/03/19 0414 04/04/19 0512 04/05/19 0520  NA 139   < > 137 138 137  K 2.6*   < > 3.0* 3.1* 3.4*  CL 104   < > 104 97* 105  CO2 25   < > 25 26 24   GLUCOSE 117*   < > 100* 90 99  BUN 24*   < > 23* 25* 20  CREATININE 1.64*   < > 1.41* 1.50* 1.36*  CALCIUM 8.6*   < > 8.6* 8.6* 8.7*  PROT 6.7  --  6.2*  --   --   ALBUMIN 3.1*  --  3.0*  --   --   AST 28  --  25  --   --   ALT 59*  --  46*  --   --   ALKPHOS 106  --  97  --   --   BILITOT 1.0  --  0.7  --   --   GFRNONAA 52*   < > >60 58* >60  GFRAA >60   < > >60 >60 >60  ANIONGAP 10   < > 8 15 8    < > = values in this interval not displayed.     Hematology Recent Labs  Lab 04/03/19 0414 04/04/19 0512 04/05/19 0520  WBC 7.8 6.4 5.6  RBC 4.63 4.49 4.78  HGB 9.8* 9.4* 9.9*  HCT 31.3* 30.0* 32.0*  MCV 67.6* 66.8* 66.9*  MCH 21.2* 20.9* 20.7*  MCHC 31.3 31.3 30.9  RDW 16.8* 16.7* 16.7*  PLT 316 322 335    BNP Recent Labs  Lab 04/01/19 0603  BNP 1,059.0*     DDimer No results for input(s): DDIMER in the last 168 hours.   Radiology    Dg Chest Port 1 View  Result Date: 04/05/2019 CLINICAL DATA:  Respiratory failure EXAM: PORTABLE CHEST 1 VIEW COMPARISON:  04/03/2019 FINDINGS: Cardiac shadow remains enlarged. Right-sided PICC line is again noted in the proximal superior vena cava. The lungs are well aerated bilaterally. Mild vascular congestion is noted. Persistent asymmetric pulmonary opacities are noted stable from the prior exam. No new focal abnormality is seen. IMPRESSION: Mild vascular congestion. Stable  right-sided infiltrate similar to that seen on the prior exam Electronically Signed   By: Inez Catalina M.D.   On: 04/05/2019 07:04    Cardiac Studies   Echo (04/02/2019): 1. The left ventricle has moderate-severely reduced systolic function, with an ejection fraction of 30-35%. The cavity size was mildly dilated. There is severely increased left ventricular wall thickness. Left ventricular diastolic Doppler parameters  are indeterminate. Left ventricular diffuse hypokinesis. 2. The right ventricle has normal systolic function. The cavity was normal. There is mildly increased right ventricular wall thickness. Right ventricular systolic pressure is moderately elevated with an estimated pressure of 45.8 mmHg. 3. Mitral valve regurgitation is moderate 4. Tricuspid valve regurgitation is moderate. 5. Rhythm is atrial flutter, ventricularrate 70 bpm   Patient Profile     38 y.o. male with acute on chronic systolic and diastolic heart failure complicated by atrial flutter.  Assessment & Plan    Acute on chronic systolic and diastolic heart failure: Patient improved since yesterday but still fatigued.  Trace ankle edema noted on exam; otherwise patient appears euvolemic.  Continue furosemide 40 mg p.o. daily.  Continue carvedilol 25 mg twice daily and losartan 100 mg daily.  Continue isosorbide mononitrate and hydralazine at current doses, though may need to de-escalate/stop isosorbide mononitrate if headache remains an ongoing problem.  Spironolactone added today by Dr. Alva Garnet.  Continue current dose.  Defer ischemia evaluation for now  Atrial flutter: Patient maintaining sinus rhythm following TEE guided cardioversion yesterday.  Continue rivaroxaban and carvedilol.  Hypertension: Blood pressure remains suboptimally controlled despite multiple agents.  Suspect hypertensive heart disease underlies the patient's cardiomyopathy.  Screen for renal artery stenosis with renal artery  Doppler study.  Consider additional work-up for secondary hypertension if blood pressure remains elevated.  Would need to discontinue spironolactone and allow washout in order to assess serum aldosterone and plasma renin activity.  Once euvolemia achieved, patient may benefit from transitioning from furosemide to a thiazide diuretic for long-term management of hypertension.    For questions or updates, please contact [place] Please consult [email or web address] for contact info  under [facility] cardiology.     Signed, Nelva Bush, MD  04/05/2019, 3:34 PM

## 2019-04-05 NOTE — Progress Notes (Signed)
Report given to RN Serenity. Patient is to be transferred to room 239 for cardiac monitoring. Rn asked patient if he would like for me to update family. Patient declined.

## 2019-04-05 NOTE — Progress Notes (Signed)
[place] for Rivaroxaban  Indication: atrial fibrillation  No Known Allergies  Patient Measurements: Height: 5\' 11"  (180.3 cm) Weight: 276 lb 14.4 oz (125.6 kg) IBW/kg (Calculated) : 75.3  Vital Signs: Temp: 97.5 F (36.4 C) (08/18 1353) Temp Source: Oral (08/18 1353) BP: 159/100 (08/18 1353) Pulse Rate: 73 (08/18 1353)  Labs: Recent Labs    04/03/19 0414 04/04/19 0512 04/05/19 0520  HGB 9.8* 9.4* 9.9*  HCT 31.3* 30.0* 32.0*  PLT 316 322 335  CREATININE 1.41* 1.50* 1.36*    Estimated Creatinine Clearance: 99.4 mL/min (A) (by C-G formula based on SCr of 1.36 mg/dL (H)).   Medical History: Past Medical History:  Diagnosis Date  . Hypertension   . Polysubstance abuse ([name])     Medications:  Scheduled:  . aspirin EC  81 mg Oral Daily  . atorvastatin  40 mg Oral q1800  . carvedilol  25 mg Oral BID WC  . Chlorhexidine Gluconate Cloth  6 each Topical Q0600  . cloNIDine  0.1 mg Oral Daily  . furosemide  40 mg Oral Daily  . hydrALAZINE  100 mg Oral Q8H  . isosorbide mononitrate  30 mg Oral Daily  . losartan  100 mg Oral Daily  . potassium chloride  40 mEq Oral Q6H  . rivaroxaban  20 mg Oral Q supper  . sodium chloride flush  10-40 mL Intracatheter Q12H  . spironolactone  50 mg Oral Daily   Infusions:  . sodium chloride      Assessment: Pharmacy consulted for rivaroxaban dosing for new onset atrial fibrillation. Patient underwent cardioversion on 8/17.   Goal of Therapy:  Monitor platelets by anticoagulation protocol: Yes   Plan:  Will continue rivaroxaban 20mg  Daily. Will continue to monitor per protocol.   Pharmacy will continue to monitor and adjust per consult.   Simpson,Michael L 04/05/2019,2:11 PM

## 2019-04-05 NOTE — Progress Notes (Signed)
[place] at [place] NAME: Gerald [name]    MR#:  [id number]  DATE OF BIRTH:  08/05/1981  SUBJECTIVE: Patient complains of headache this morning,  CHIEF COMPLAINT:   Chief Complaint  Patient presents with  . Shortness of Breath   Status post cardioversion, now in sinus rhythm, has some headache, BP elevated this morning.  REVIEW OF SYSTEMS:    Review of Systems  Constitutional: Negative for chills, fever and malaise/fatigue.  HENT: Negative for sore throat.   Eyes: Negative for blurred vision, double vision and pain.  Respiratory: Negative for cough, hemoptysis, shortness of breath and wheezing.   Cardiovascular: Negative for chest pain, palpitations, orthopnea and leg swelling.  Gastrointestinal: Negative for abdominal pain, constipation, diarrhea, heartburn, nausea and vomiting.  Genitourinary: Negative for dysuria and hematuria.  Musculoskeletal: Negative for back pain and joint pain.  Skin: Negative for rash.  Neurological: Negative for sensory change, speech change, focal weakness and headaches.  Endo/Heme/Allergies: Does not bruise/bleed easily.  Psychiatric/Behavioral: Negative for depression. The patient is not nervous/anxious.    DRUG ALLERGIES:  No Known Allergies  VITALS:  Blood pressure (!) 159/100, pulse 73, temperature (!) 97.5 F (36.4 C), temperature source Oral, resp. rate (!) 22, height 5\' 11"  (1.803 m), weight 125.6 kg, SpO2 100 %.  PHYSICAL EXAMINATION:   Physical Exam  GENERAL:  38 y.o.-year-old patient lying in the bed with no acute distress.  Obese.  Conversational dyspnea EYES: Pupils equal, round, reactive to light  No scleral icterus. Extraocular muscles intact.  HEENT: Head atraumatic, normocephalic. Oropharynx and nasopharynx clear.  NECK:  Supple, no jugular venous distention. No thyroid enlargement, no tenderness.  LUNGS: Decreased air entry  CARDIOVASCULAR: Irregularly irregular ABDOMEN: Soft,  nontender, nondistended. Bowel sounds present. No organomegaly or mass.  EXTREMITIES: No cyanosis, clubbing or edema b/l.    NEUROLOGIC: Cranial nerves II through XII are intact. No focal Motor or sensory deficits b/l.   PSYCHIATRIC: The patient is alert and oriented x 3.  SKIN: No obvious rash, lesion, or ulcer.   LABORATORY PANEL:   CBC Recent Labs  Lab 04/05/19 0520  WBC 5.6  HGB 9.9*  HCT 32.0*  PLT 335   ------------------------------------------------------------------------------------------------------------------ Chemistries  Recent Labs  Lab 04/03/19 0414 04/04/19 0512 04/05/19 0520  NA 137 138 137  K 3.0* 3.1* 3.4*  CL 104 97* 105  CO2 25 26 24   GLUCOSE 100* 90 99  BUN 23* 25* 20  CREATININE 1.41* 1.50* 1.36*  CALCIUM 8.6* 8.6* 8.7*  MG 1.9 2.0  --   AST 25  --   --   ALT 46*  --   --   ALKPHOS 97  --   --   BILITOT 0.7  --   --    ------------------------------------------------------------------------------------------------------------------  Cardiac Enzymes No results for input(s): TROPONINI in the last 168 hours. ------------------------------------------------------------------------------------------------------------------  RADIOLOGY:  Dg Chest Port 1 View  Result Date: 04/05/2019 CLINICAL DATA:  Respiratory failure EXAM: PORTABLE CHEST 1 VIEW COMPARISON:  04/03/2019 FINDINGS: Cardiac shadow remains enlarged. Right-sided PICC line is again noted in the proximal superior vena cava. The lungs are well aerated bilaterally. Mild vascular congestion is noted. Persistent asymmetric pulmonary opacities are noted stable from the prior exam. No new focal abnormality is seen. IMPRESSION: Mild vascular congestion. Stable right-sided infiltrate similar to that seen on the prior exam Electronically Signed   By: Inez Catalina M.D.   On: 04/05/2019 07:04   ASSESSMENT AND PLAN:   *  Acute on chronic systolic congestive heart failure - IV Lasix, Beta blockers -  Input and Output---5800 ml - Counseled to limit fluids and Salt - Monitor Bun/Cr and Potassium - Echo-ejection fraction 30 to 35% -Cardiology follow up.  Continue spironolactone, losartan.  Has used cocaine in the past and uncontrolled HTN likely contributed to his cardiomyopathy  *Acute hypoxic respiratory failure secondary to CHF   Resolved  *Atrial fib/flutter.  Status post cardioversion yesterday, now in sinus rhythm, continue anticoagulation with Xarelto, *Malignant hypertension, patient is on Lasix, clonidine,losartan,hydralazine Transfer out of ICU today, BP stable and he is in sinus rhythm likely discharge home tomorrow.    all the records are reviewed and case discussed with Care Management/Social Worker Management plans discussed with the patient, family and they are in agreement.  CODE STATUS: FULL CODE  TOTAL TIME TAKING CARE OF THIS PATIENT: 35 minutes.  More than 50% time spent in counseling, coordination of care POSSIBLE D/C IN 1-2 DAYS, DEPENDING ON CLINICAL CONDITION.  Epifanio Lesches M.D on 04/05/2019 at 3:05 PM  Between 7am to 6pm - Pager - [phone]  After 6pm go to [email or web address] - password [email or web address] [facility]  Office  [phone]  CC: Primary care physician; Patient, [name]  Note: This dictation was prepared with Dragon dictation along with smaller phrase technology. Any transcriptional errors that result from this process are unintentional.

## 2019-04-06 ENCOUNTER — Inpatient Hospital Stay: Payer: Self-pay

## 2019-04-06 DIAGNOSIS — I10 Essential (primary) hypertension: Secondary | ICD-10-CM

## 2019-04-06 LAB — BASIC METABOLIC PANEL
Anion gap: 8 (ref 5–15)
BUN: 17 mg/dL (ref 6–20)
CO2: 24 mmol/L (ref 22–32)
Calcium: 9.1 mg/dL (ref 8.9–10.3)
Chloride: 106 mmol/L (ref 98–111)
Creatinine, Ser: 1.29 mg/dL — ABNORMAL HIGH (ref 0.61–1.24)
GFR calc Af Amer: >60 mL/min (ref >60)
GFR calc non Af Amer: >60 mL/min (ref >60)
Glucose, Bld: 106 mg/dL — ABNORMAL HIGH (ref 70–99)
Potassium: 3.9 mmol/L (ref 3.5–5.1)
Sodium: 138 mmol/L (ref 135–145)

## 2019-04-06 LAB — CULTURE, BLOOD (ROUTINE X 2)
Culture: NO GROWTH
Culture: NO GROWTH
Special Requests: ADEQUATE

## 2019-04-06 LAB — CBC
HCT: 35 % — ABNORMAL LOW (ref 39.0–52.0)
Hemoglobin: 10.8 g/dL — ABNORMAL LOW (ref 13.0–17.0)
MCH: 20.7 pg — ABNORMAL LOW (ref 26.0–34.0)
MCHC: 30.9 g/dL (ref 30.0–36.0)
MCV: 67 fL — ABNORMAL LOW (ref 80.0–100.0)
Platelets: 373 10*3/uL (ref 150–400)
RBC: 5.22 MIL/uL (ref 4.22–5.81)
RDW: 17 % — ABNORMAL HIGH (ref 11.5–15.5)
WBC: 6.2 10*3/uL (ref 4.0–10.5)
nRBC: 0 % (ref 0.0–0.2)

## 2019-04-06 LAB — GLUCOSE, CAPILLARY: Glucose-Capillary: 97 mg/dL (ref 70–99)

## 2019-04-06 LAB — TROPONIN I (HIGH SENSITIVITY)
Troponin I (High Sensitivity): 223 ng/L (ref <18)
Troponin I (High Sensitivity): 229 ng/L (ref <18)

## 2019-04-06 MED ORDER — MORPHINE SULFATE (PF) 2 MG/ML IV SOLN
2.0000 mg | INTRAVENOUS | Status: AC
  Administered 2019-04-06: 2 mg via INTRAVENOUS
  Filled 2019-04-06: qty 1

## 2019-04-06 MED ORDER — HYDROCHLOROTHIAZIDE 25 MG PO TABS
25.0000 mg | ORAL_TABLET | Freq: Every day | ORAL | Status: DC
  Administered 2019-04-06 – 2019-04-09 (×4): 25 mg via ORAL
  Filled 2019-04-06 (×4): qty 1

## 2019-04-06 MED ORDER — NICARDIPINE HCL IN NACL 20-0.86 MG/200ML-% IV SOLN
3.0000 mg/h | INTRAVENOUS | Status: DC
  Filled 2019-04-06 (×2): qty 200

## 2019-04-06 MED ORDER — POTASSIUM CHLORIDE CRYS ER 20 MEQ PO TBCR
40.0000 meq | EXTENDED_RELEASE_TABLET | Freq: Every day | ORAL | Status: DC
  Administered 2019-04-06: 40 meq via ORAL
  Filled 2019-04-06: qty 2

## 2019-04-06 MED ORDER — LABETALOL HCL 5 MG/ML IV SOLN
10.0000 mg | Freq: Once | INTRAVENOUS | Status: AC
  Administered 2019-04-06: 10 mg via INTRAVENOUS
  Filled 2019-04-06: qty 4

## 2019-04-06 MED ORDER — AMLODIPINE BESYLATE 5 MG PO TABS
5.0000 mg | ORAL_TABLET | Freq: Every day | ORAL | Status: DC
  Administered 2019-04-06: 09:00:00 5 mg via ORAL
  Filled 2019-04-06: qty 1

## 2019-04-06 MED ORDER — HYDRALAZINE HCL 20 MG/ML IJ SOLN
10.0000 mg | Freq: Once | INTRAMUSCULAR | Status: AC
  Administered 2019-04-06: 10 mg via INTRAVENOUS
  Filled 2019-04-06: qty 1

## 2019-04-06 MED ORDER — HYDROMORPHONE HCL 1 MG/ML IJ SOLN
0.5000 mg | INTRAMUSCULAR | Status: DC | PRN
  Administered 2019-04-06 – 2019-04-08 (×13): 0.5 mg via INTRAVENOUS
  Filled 2019-04-06 (×13): qty 1

## 2019-04-06 MED ORDER — POTASSIUM CHLORIDE CRYS ER 20 MEQ PO TBCR
20.0000 meq | EXTENDED_RELEASE_TABLET | Freq: Every day | ORAL | Status: DC
  Administered 2019-04-07 – 2019-04-09 (×3): 20 meq via ORAL
  Filled 2019-04-06 (×3): qty 1

## 2019-04-06 MED ORDER — HYDROMORPHONE HCL 1 MG/ML IJ SOLN
1.0000 mg | INTRAMUSCULAR | Status: DC | PRN
  Administered 2019-04-06: 1 mg via INTRAVENOUS
  Filled 2019-04-06: qty 1

## 2019-04-06 MED ORDER — AMLODIPINE BESYLATE 10 MG PO TABS
10.0000 mg | ORAL_TABLET | Freq: Every day | ORAL | Status: DC
  Administered 2019-04-07 – 2019-04-09 (×3): 10 mg via ORAL
  Filled 2019-04-06 (×3): qty 1

## 2019-04-06 NOTE — Progress Notes (Signed)
[place] at [place] NAME: Gerald [name]    MR#:  [id number]  DATE OF BIRTH:  [birth year]  SUBJECTIVE: States that morphine is helping with headache.  CHIEF COMPLAINT:   Chief Complaint  Patient presents with  . Shortness of Breath   Headache, transferred again to high blood pressure and headache.  Head CAT scan showed no acute bleed, which showed remote stroke, discussed this with patient. REVIEW OF SYSTEMS:    Review of Systems  Constitutional: Negative for chills, fever and malaise/fatigue.  HENT: Negative for sore throat.   Eyes: Negative for blurred vision, double vision and pain.  Respiratory: Negative for cough, hemoptysis, shortness of breath and wheezing.   Cardiovascular: Negative for chest pain, palpitations, orthopnea and leg swelling.  Gastrointestinal: Negative for abdominal pain, constipation, diarrhea, heartburn, nausea and vomiting.  Genitourinary: Negative for dysuria and hematuria.  Musculoskeletal: Negative for back pain and joint pain.  Skin: Negative for rash.  Neurological: Negative for sensory change, speech change, focal weakness and headaches.  Endo/Heme/Allergies: Does not bruise/bleed easily.  Psychiatric/Behavioral: Negative for depression. The patient is not nervous/anxious.    DRUG ALLERGIES:  No Known Allergies  VITALS:  Blood pressure 137/83, pulse 77, temperature 98 F (36.7 C), temperature source Axillary, resp. rate 18, height 5\' 11"  (1.803 m), weight 120 kg, SpO2 97 %.  PHYSICAL EXAMINATION:   Physical Exam  GENERAL:  38 y.o.-year-old patient lying in the bed with no acute distress.  Obese.  Conversational dyspnea EYES: Pupils equal, round, reactive to light  No scleral icterus. Extraocular muscles intact.  HEENT: Head atraumatic, normocephalic. Oropharynx and nasopharynx clear.  NECK:  Supple, no jugular venous distention. No thyroid enlargement, no tenderness.  LUNGS: Decreased air entry   CARDIOVASCULAR: Irregularly irregular ABDOMEN: Soft, nontender, nondistended. Bowel sounds present. No organomegaly or mass.  EXTREMITIES: No cyanosis, clubbing or edema b/l.    NEUROLOGIC: Cranial nerves II through XII are intact. No focal Motor or sensory deficits b/l.   PSYCHIATRIC: The patient is alert and oriented x 3.  SKIN: No obvious rash, lesion, or ulcer.   LABORATORY PANEL:   CBC Recent Labs  Lab 04/06/19 0610  WBC 6.2  HGB 10.8*  HCT 35.0*  PLT 373   ------------------------------------------------------------------------------------------------------------------ Chemistries  Recent Labs  Lab 04/03/19 0414 04/04/19 0512  04/06/19 0610  NA 137 138   < > 138  K 3.0* 3.1*   < > 3.9  CL 104 97*   < > 106  CO2 25 26   < > 24  GLUCOSE 100* 90   < > 106*  BUN 23* 25*   < > 17  CREATININE 1.41* 1.50*   < > 1.29*  CALCIUM 8.6* 8.6*   < > 9.1  MG 1.9 2.0  --   --   AST 25  --   --   --   ALT 46*  --   --   --   ALKPHOS 97  --   --   --   BILITOT 0.7  --   --   --    < > = values in this interval not displayed.   ------------------------------------------------------------------------------------------------------------------  Cardiac Enzymes No results for input(s): TROPONINI in the last 168 hours. ------------------------------------------------------------------------------------------------------------------  RADIOLOGY:  Ct Head Wo Contrast  Result Date: 04/06/2019 CLINICAL DATA:  Acute severe headache. EXAM: CT HEAD WITHOUT CONTRAST TECHNIQUE: Contiguous axial images were obtained from the base of the skull through the vertex without  intravenous contrast. COMPARISON:  None. FINDINGS: Brain: No evidence of acute infarction, hemorrhage, hydrocephalus, extra-axial collection or mass lesion/mass effect. 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 Vascular: Hyperdense vessel. Skull: No fracture or focal lesion. Sinuses/Orbits: Paranasal sinuses and mastoid air  cells are clear. The visualized orbits are unremarkable. Other: None. IMPRESSION: 1. No acute intracranial abnormality. 2. 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 Electronically Signed   By: Keith Rake M.D.   On: 04/06/2019 03:48   Dg Chest Port 1 View  Result Date: 04/05/2019 CLINICAL DATA:  Respiratory failure EXAM: PORTABLE CHEST 1 VIEW COMPARISON:  04/03/2019 FINDINGS: Cardiac shadow remains enlarged. Right-sided PICC line is again noted in the proximal superior vena cava. The lungs are well aerated bilaterally. Mild vascular congestion is noted. Persistent asymmetric pulmonary opacities are noted stable from the prior exam. No new focal abnormality is seen. IMPRESSION: Mild vascular congestion. Stable right-sided infiltrate similar to that seen on the prior exam Electronically Signed   By: Inez Catalina M.D.   On: 04/05/2019 07:04   US Renal Artery Duplex Complete  Result Date: 04/06/2019 CLINICAL DATA:  38 year old male with hypertension EXAM: RENAL/URINARY TRACT ULTRASOUND RENAL DUPLEX ULTRASOUND COMPARISON:  None. FINDINGS: Right Kidney: Length: 12.3 cm. Cortex of the right kidney echogenic compared to the adjacent liver parenchyma. No hydronephrosis Left Kidney: Length: 11.1 cm. Cortex of the left kidney is echogenic, symmetric to the right. No hydronephrosis. Bladder:  Unremarkable RENAL DUPLEX ULTRASOUND Right Renal Artery Velocities: Origin:  105 cm/sec Mid:  81 cm/sec Hilum:  79 cm/sec Interlobar:  23 cm/sec Arcuate:  40 cm/sec Left Renal Artery Velocities: Origin:  47 cm/sec Mid:  73 cm/sec Hilum:  73 cm/sec Interlobar:  20 cm/sec Arcuate:  23 cm/sec Aortic Velocity:  79 cm/sec Right Renal-Aortic Ratios: Origin: 1.3 Mid:  1.0 Hilum: 1.0 Interlobar: 0.3 Arcuate: 0.5 Left Renal-Aortic Ratios: Origin: 0.6 Mid: 0.9 Hilum: 0.9 Interlobar: 0.3 Arcuate: 0.3 IMPRESSION: Directed duplex of the renal arteries demonstrates no high-grade stenosis. Increased echogenicity of the bilateral renal  cortex suggesting medical renal disease. No hydronephrosis. Signed, Dulcy Fanny. Dellia Nims, RPVI [facility] Electronically Signed   By: Corrie Mckusick D.O.   On: 04/06/2019 12:13   ASSESSMENT AND PLAN:   *Acute on chronic systolic congestive heart failure -Continue Lasix, metoprolol, cardiology is thinking about starting Entresto as an outpatient. - Input and Output---5800 ml - Counseled to limit fluids and Salt - Monitor Bun/Cr and Potassium - Echo-ejection fraction 30 to 35% -Cardiology follow up.  Continue spironolactone, losartan.,  Coreg, Patient headache can be multifactorial secondary to her high blood pressure and also starting new medication including Coreg, spironolactone, losartan.  Cardiology recommends stopping de-escalation of Imdur due to headache. *Acute hypoxic respiratory failure secondary to CHF   Resolved  *Atrial fib/flutter.  Status post cardioversion , now in sinus rhythm, continue anticoagulation with Xarelto,  *Malignant hypertension, headache, spoke with cardiology, recommended work-up for p.o. press chromocytoma, renal artery ultrasound did not show any high-grade stenosis, no hydronephrosis. 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  Discussed t CT head results with patient.. Told me he feels better after knowing the results of head CT, renal artery ultrasound, he says oxycodone helping with headache for now.  all the records are reviewed and case discussed with Care Management/Social Worker Management plans discussed with the patient, family and they are in agreement.  CODE STATUS: FULL CODE  TOTAL TIME TAKING CARE OF THIS PATIENT: 35 minutes.  More  than 50% time spent in counseling, coordination of care POSSIBLE D/C IN 1-2 DAYS, DEPENDING ON CLINICAL CONDITION.  Epifanio Lesches M.D on 04/06/2019 at 4:30 PM  Between 7am to 6pm - Pager - [phone]  After 6pm go to [email or web address] -  password [email or web address] [facility]  Office  [phone]  CC: Primary care physician; Patient, [name]  Note: This dictation was prepared with Dragon dictation along with smaller phrase technology. Any transcriptional errors that result from this process are unintentional.

## 2019-04-06 NOTE — Plan of Care (Signed)
  Problem: Clinical Measurements: Goal: Ability to maintain clinical measurements within normal limits will improve Outcome: Progressing   Problem: Activity: Goal: Risk for activity intolerance will decrease Outcome: Progressing   Problem: Safety: Goal: Ability to remain free from injury will improve Outcome: Progressing   Problem: Clinical Measurements: Goal: Cardiovascular complication will be avoided Outcome: Not Progressing Note: Patient has had uncontrolled hypertension despite receiving numerous doses of IV medication.     Problem: Pain Managment: Goal: General experience of comfort will improve Outcome: Not Progressing Note: Patient continues to have headache after receiving PRN medication.

## 2019-04-06 NOTE — Consult Note (Signed)
Name: Gerald [name] MRN: [id number] DOB: [birth year]    ADMISSION DATE:  04/01/2019 CONSULTATION DATE: 04/06/2019  REFERRING MD : Dr. Posey Pronto   CHIEF COMPLAINT: Hypertension, Headache  BRIEF PATIENT DESCRIPTION:  38 yo male admitted 8/14 with acute hypoxic respiratory failure secondary to pulmonary edema requiring Bipap and new onset atrial flutter with rvr requiring cardizem gtt.  Successfully cardioverted on 8/17. Transfer to Med/Surg on 8/18.  Transfer back to ICU on 8/19 due to severe Hypertension requiring Nicardipine gtt and severe headache.  CT Head negative for acute intracranial abnormality.  SIGNIFICANT EVENTS/STUDIES:  08/14-Pt admitted to the stepdown unit on Bipap  08/17>> successful cardioversion 08/18>> transfer to med/surg 08/19>> transfer to ICU for Nicardipine gtt. 08/19>> CT Head negative for acute intracranial abnormality  HISTORY OF PRESENT ILLNESS:   This is 38 yo male with a PMH of HTN, Current Smoker, and Diastolic Dysfunction with preserved EF 60% per Echo 2017.  He presented to [facility] ER with c/o headache, productive cough, swelling bilateral lower extremity edema, and shortness of breath onset of symptoms 2 days prior to presentation.  Per ER notes he ran out of his outpatient antihypertensive and statin medications 3 months ago due to financial reasons.  He currently resides in a boarding house and is unaware if he has had contact with anyone with COVID-19.  ER lab results revealed K+ 2.6, glucose 117, BUN 24, creatinine 1.64, ALT 59, BNP 1,059, troponin 298, lactic acid 0.9, pct <0.10, wbc 8.3, and hgb 10.9.  COVID-19 negative x2, however CXR concerning for pneumonia vs. pulmonary edema.  EKG revealed atrial flutter with rvr heart rate 150's, but no clear evidence of acute ischemia.  Pt received 15 mg iv cardizem for hr control, however due to continued rvr he received an additional 20 mg iv cardizem.  Heparin gtt also initiated due to elevated troponin.  He was also  hypertensive bp 210/149 and pt noted to have low grade temp of 100.6 F.  Due to concern of sepsis secondary to possible pneumonia he received azithromycin and ceftriaxone.  Pt also placed on Bipap due to increased work of breathing.  He was subsequently admitted to the stepdown unit per hospitalist team for additional workup and treatment.   Shortly after arrival to ICU he was able to be successfully weaned off BiPAP.  Cardiology evaluated him and he was placed on Cardizem and esmolol infusions.  He was found to have acute combined systolic and diastolic CHF on echo suggestive of severe hypertensive heart disease.  He underwent successful cardioversion on 8/17.  On 8/18 he was transferred to the [place] unit.  However early in the morning on 8/19 he was noted to be severely hypertensive (BP 188/105) accompanied by severe headache 10/10.  He received 20 mg IV hydralazine and 100 mg p.o. hydralazine, 10 mg IV labetalol, 2 mg IV morphine.  Follow-up blood pressure 196/115, of which he received an additional 10 mg IV hydralazine. Blood pressure noted to be 195/102.  CT head is negative for any acute intracranial abnormality.  He is being transferred to ICU for initiation of Cardene drip.  PCCM is consulted for further management.  PAST MEDICAL HISTORY :   has a past medical history of Hypertension and Polysubstance abuse ([place]).  has a past surgical history that includes Knee surgery (Right) and TEE without cardioversion (N/A, 04/04/2019). Prior to Admission medications   Medication Sig Start Date End Date Taking? Authorizing Provider  aspirin EC 81 MG EC tablet Take  1 tablet (81 mg total) by mouth daily. Patient not taking: Reported on 04/01/2019 12/23/15   Demetrios Loll, MD  atorvastatin (LIPITOR) 40 MG tablet Take 1 tablet (40 mg total) by mouth daily at 6 PM. Patient not taking: Reported on 04/01/2019 06/27/18   Lavonia Drafts, MD  cloNIDine (CATAPRES) 0.2 MG tablet Take 1 tablet (0.2 mg total) by mouth 2 (two)  times daily. Patient not taking: Reported on 04/01/2019 06/27/18   Lavonia Drafts, MD  hydrALAZINE (APRESOLINE) 100 MG tablet Take 1 tablet (100 mg total) by mouth every 8 (eight) hours. Patient not taking: Reported on 04/01/2019 06/27/18   Lavonia Drafts, MD  isosorbide mononitrate (IMDUR) 30 MG 24 hr tablet Take 1 tablet (30 mg total) by mouth daily. Patient not taking: Reported on 04/01/2019 06/27/18   Lavonia Drafts, MD   No Known Allergies  FAMILY HISTORY:  family history includes Hypertension in his father. SOCIAL HISTORY:  reports that he has been smoking cigarettes. He has been smoking about 1.00 pack per day. He has never used smokeless tobacco. He reports current alcohol use. He reports current drug use. Drug: Cocaine.  REVIEW OF SYSTEMS: Positives in BOLD: Gen: Denies fever, chills, weight change, fatigue, night sweats HEENT: Denies blurred vision, double vision, hearing loss, tinnitus, sinus congestion, rhinorrhea, sore throat, neck stiffness, dysphagia PULM: Denies shortness of breath, cough, sputum production, hemoptysis, wheezing CV: Denies chest pain, edema, orthopnea, paroxysmal nocturnal dyspnea, palpitations GI: Denies abdominal pain, nausea, vomiting, diarrhea, hematochezia, melena, constipation, change in bowel habits GU: Denies dysuria, hematuria, polyuria, oliguria, urethral discharge Endocrine: Denies hot or cold intolerance, polyuria, polyphagia or appetite change Derm: Denies rash, dry skin, scaling or peeling skin change Heme: Denies easy bruising, bleeding, bleeding gums Neuro: Denies +headache, numbness, weakness, slurred speech, loss of memory or consciousness  SUBJECTIVE:  Reports 8 out of 10 headache Denies chest pain, shortness of breath, palpitations, dizziness, diplopia, abdominal pain, fever/chills On room air  VITAL SIGNS: Temp:  [97.4 F (36.3 C)-98.8 F (37.1 C)] 97.6 F (36.4 C) (08/19 0344) Pulse Rate:  [73-88] 82 (08/19 0344) Resp:  [12-32]  18 (08/19 0344) BP: (140-196)/(89-119) 195/102 (08/19 0344) SpO2:  [96 %-100 %] 99 % (08/19 0344) Weight:  [125.6 kg] 125.6 kg (08/18 1353)  PHYSICAL EXAMINATION: General: Acutely ill-appearing obese male, laying in bed, on room air, 8 out of 10 headache, no acute distress Neuro: Awake, alert and oriented, follows commands, no focal deficits, speech clear HEENT: Atraumatic, normocephalic, neck supple, no JVD, pupils PERRLA Cardiovascular: Regular rate and rhythm, S1-S2, no murmurs rubs or gallops, 2+ pulses Lungs: Clear to auscultation bilaterally, no wheezing, even, nonlabored, normal effort Abdomen: Obese, soft, nontender, nondistended, no guarding or rebound tenderness, bowel sounds positive x4 Musculoskeletal: Normal bulk and tone, no deformities, trace edema bilateral lower extremities Skin: Warm and dry, no obvious rashes, lesions, or ulcerations  Recent Labs  Lab 04/03/19 0414 04/04/19 0512 04/05/19 0520  NA 137 138 137  K 3.0* 3.1* 3.4*  CL 104 97* 105  CO2 25 26 24   BUN 23* 25* 20  CREATININE 1.41* 1.50* 1.36*  GLUCOSE 100* 90 99   Recent Labs  Lab 04/03/19 0414 04/04/19 0512 04/05/19 0520  HGB 9.8* 9.4* 9.9*  HCT 31.3* 30.0* 32.0*  WBC 7.8 6.4 5.6  PLT 316 322 335   Ct Head Wo Contrast  Result Date: 04/06/2019 CLINICAL DATA:  Acute severe headache. EXAM: CT HEAD WITHOUT CONTRAST TECHNIQUE: Contiguous axial images were obtained from the base of the  skull through the vertex without intravenous contrast. COMPARISON:  None. FINDINGS: Brain: No evidence of acute infarction, hemorrhage, hydrocephalus, extra-axial collection or mass lesion/mass effect. 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 Vascular: Hyperdense vessel. Skull: No fracture or focal lesion. Sinuses/Orbits: Paranasal sinuses and mastoid air cells are clear. The visualized orbits are unremarkable. Other: None. IMPRESSION: 1. No acute intracranial abnormality. 2. 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  Electronically Signed   By: Keith Rake M.D.   On: 04/06/2019 03:48   Dg Chest Port 1 View  Result Date: 04/05/2019 CLINICAL DATA:  Respiratory failure EXAM: PORTABLE CHEST 1 VIEW COMPARISON:  04/03/2019 FINDINGS: Cardiac shadow remains enlarged. Right-sided PICC line is again noted in the proximal superior vena cava. The lungs are well aerated bilaterally. Mild vascular congestion is noted. Persistent asymmetric pulmonary opacities are noted stable from the prior exam. No new focal abnormality is seen. IMPRESSION: Mild vascular congestion. Stable right-sided infiltrate similar to that seen on the prior exam Electronically Signed   By: Inez Catalina M.D.   On: 04/05/2019 07:04    ASSESSMENT / PLAN:  Acute hypoxic respiratory failure secondary to pulmonary edema>> resolved Current everyday smoker  -Supplemental oxygen as needed to maintain SPO2 greater than 92% -Follow intermittent chest x-ray and ABG as needed -PRN bronchodilators -Encourage smoking cessation  Uncontrolled hypertension (patient ran out of medications 3 months ago due to financial reasons) Acute combined systolic and diastolic CHF   New onset atrial flutter with rvr>> resolved Elevated troponin's secondary demand ischemia vs. NSTEMI  Hx: Diastolic Dysfunction with preserved EF 60% (Echo 2017)  -Continuous cardiac monitoring -Cardiology following, appreciate input -Check EKG -Trend troponin -Continue Coreg, hydralazine, Imdur, losartan, clonidine -Add Norvasc -Initiate nicardipine drip, titrate to maintain systolic blood pressure less than 160 -Renal artery Doppler study is pending -Consider discontinuing Spironolactone and allowing washout to assess serum aldosterone and plasma renin activity -Status post successful cardioversion on 8/17 -Continue rivaroxaban -Continue daily Lasix as blood pressure and renal function permits  Acute renal failure  Hypokalemia due to loop diuresis  -Monitor I&O's / urinary  output -Follow BMP -Ensure adequate renal perfusion -Avoid nephrotoxic agents as able -Replace electrolytes as indicated  Anemia without obvious acute blood loss -Monitor for S/Sx of bleeding -Trend CBC -Rivaroxaban for Anticoagulation/VTE Prophylaxis  -Transfuse for Hgb <7  Headache, likely secondary to HTN -CT head 8/19 negative -Treat HTN -prn Dilaudid        Disposition: ICU Goals of care: Full code VTE prophylaxis: Rivaroxaban Updates: Updated patient at bedside 04/06/2019   Darel Hong, [facility] [place] [facility] Pager: [phone] Cell: [phone]

## 2019-04-06 NOTE — Progress Notes (Signed)
Progress Note  Patient Name: Gerald [name] Date of Encounter: 04/06/2019  Primary Cardiologist: Dr. Rockey Situ  Subjective   Fatigued this AM. No CP. Continued HA. Reported also R arm pain from sleeping on his arm.  Inpatient Medications    Scheduled Meds:  [START ON 04/07/2019] amLODipine  10 mg Oral Daily   aspirin EC  81 mg Oral Daily   atorvastatin  40 mg Oral q1800   carvedilol  25 mg Oral BID WC   Chlorhexidine Gluconate Cloth  6 each Topical Q0600   cloNIDine  0.1 mg Oral Daily   furosemide  40 mg Oral Daily   hydrALAZINE  100 mg Oral Q8H   isosorbide mononitrate  30 mg Oral Daily   losartan  100 mg Oral Daily   potassium chloride  40 mEq Oral Daily   rivaroxaban  20 mg Oral Q supper   sodium chloride flush  10-40 mL Intracatheter Q12H   spironolactone  50 mg Oral Daily   Continuous Infusions:  sodium chloride     PRN Meds: sodium chloride, acetaminophen **OR** acetaminophen, ALPRAZolam, hydrALAZINE, HYDROmorphone (DILAUDID) injection, metoprolol tartrate, [DISCONTINUED] ondansetron **OR** ondansetron (ZOFRAN) IV, oxyCODONE, sodium chloride flush   Vital Signs    Vitals:   04/06/19 0800 04/06/19 0900 04/06/19 0908 04/06/19 1000  BP: (!) 154/82 (!) 160/79 (!) 160/79 (!) 149/90  Pulse: 86 85  86  Resp: 17 (!) 32  (!) 24  Temp: 98 F (36.7 C)     TempSrc: Axillary     SpO2: 97% 96%  96%  Weight:      Height:        Intake/Output Summary (Last 24 hours) at 04/06/2019 1140 Last data filed at 04/06/2019 1000 Gross per 24 hour  Intake 270 ml  Output 3300 ml  Net -3030 ml   Last 3 Weights 04/06/2019 04/06/2019 04/05/2019  Weight (lbs) 264 lb 8.8 oz 276 lb 14.4 oz 276 lb 14.4 oz  Weight (kg) 120 kg 125.6 kg 125.601 kg      Telemetry    SR 60-100bpm - Personally Reviewed  ECG    No new tracings- Personally Reviewed  Physical Exam   GEN: Somnolent Neck: JVD difficult to assess Cardiac: RRR, no murmurs, rubs, or gallops.  Respiratory:  Clear to auscultation bilaterally. GI: Soft, nontender, non-distended  MS: Minimal b/l lower extremity edema; No deformity. Neuro:  Nonfocal  Psych: Normal affect   Labs    High Sensitivity Troponin:   Recent Labs  Lab 04/01/19 0603 04/01/19 0827 04/06/19 0610 04/06/19 0738  TROPONINIHS 298* 298* 223* 229*      Cardiac EnzymesNo results for input(s): TROPONINI in the last 168 hours. No results for input(s): TROPIPOC in the last 168 hours.   Chemistry Recent Labs  Lab 04/01/19 0603  04/03/19 0414 04/04/19 0512 04/05/19 0520 04/06/19 0610  NA 139   < > 137 138 137 138  K 2.6*   < > 3.0* 3.1* 3.4* 3.9  CL 104   < > 104 97* 105 106  CO2 25   < > 25 26 24 24   GLUCOSE 117*   < > 100* 90 99 106*  BUN 24*   < > 23* 25* 20 17  CREATININE 1.64*   < > 1.41* 1.50* 1.36* 1.29*  CALCIUM 8.6*   < > 8.6* 8.6* 8.7* 9.1  PROT 6.7  --  6.2*  --   --   --   ALBUMIN 3.1*  --  3.0*  --   --   --  AST 28  --  25  --   --   --   ALT 59*  --  46*  --   --   --   ALKPHOS 106  --  97  --   --   --   BILITOT 1.0  --  0.7  --   --   --   GFRNONAA 52*   < > >60 58* >60 >60  GFRAA >60   < > >60 >60 >60 >60  ANIONGAP 10   < > 8 15 8 8    < > = values in this interval not displayed.     Hematology Recent Labs  Lab 04/04/19 0512 04/05/19 0520 04/06/19 0610  WBC 6.4 5.6 6.2  RBC 4.49 4.78 5.22  HGB 9.4* 9.9* 10.8*  HCT 30.0* 32.0* 35.0*  MCV 66.8* 66.9* 67.0*  MCH 20.9* 20.7* 20.7*  MCHC 31.3 30.9 30.9  RDW 16.7* 16.7* 17.0*  PLT 322 335 373    BNP Recent Labs  Lab 04/01/19 0603  BNP 1,059.0*     DDimer No results for input(s): DDIMER in the last 168 hours.   Radiology    Ct Head Wo Contrast  Result Date: 04/06/2019 CLINICAL DATA:  Acute severe headache. EXAM: CT HEAD WITHOUT CONTRAST TECHNIQUE: Contiguous axial images were obtained from the base of the skull through the vertex without intravenous contrast. COMPARISON:  None. FINDINGS: Brain: No evidence of acute  infarction, hemorrhage, hydrocephalus, extra-axial collection or mass lesion/mass effect. 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 Vascular: Hyperdense vessel. Skull: No fracture or focal lesion. Sinuses/Orbits: Paranasal sinuses and mastoid air cells are clear. The visualized orbits are unremarkable. Other: None. IMPRESSION: 1. No acute intracranial abnormality. 2. 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 Electronically Signed   By: Keith Rake M.D.   On: 04/06/2019 03:48   Dg Chest Port 1 View  Result Date: 04/05/2019 CLINICAL DATA:  Respiratory failure EXAM: PORTABLE CHEST 1 VIEW COMPARISON:  04/03/2019 FINDINGS: Cardiac shadow remains enlarged. Right-sided PICC line is again noted in the proximal superior vena cava. The lungs are well aerated bilaterally. Mild vascular congestion is noted. Persistent asymmetric pulmonary opacities are noted stable from the prior exam. No new focal abnormality is seen. IMPRESSION: Mild vascular congestion. Stable right-sided infiltrate similar to that seen on the prior exam Electronically Signed   By: Inez Catalina M.D.   On: 04/05/2019 07:04    Cardiac Studies   Echo (04/02/2019): 1. The left ventricle has moderate-severely reduced systolic function, with an ejection fraction of 30-35%. The cavity size was mildly dilated. There is severely increased left ventricular wall thickness. Left ventricular diastolic Doppler parameters  are indeterminate. Left ventricular diffuse hypokinesis. 2. The right ventricle has normal systolic function. The cavity was normal. There is mildly increased right ventricular wall thickness. Right ventricular systolic pressure is moderately elevated with an estimated pressure of 45.8 mmHg. 3. Mitral valve regurgitation is moderate 4. Tricuspid valve regurgitation is moderate. 5. Rhythm is atrial flutter, ventricularrate 70 bpm  Patient Profile     38 y.o. male with a history of acute on chronic systolic and diastolic  heart failure, complicated by atrial flutter.  Assessment & Plan    Acute on chronic systolic and diastolic heart failure --Euvolemic on exam.  Continue lasix, Coreg, amlodipine, losartan, Imdur, hydralazine, clonidine, PRN IV metoprolol, and spironolactone. Recommend transition of clonidine if possible due to rebound HTN. Consider de-escalation or stopping Imdur if headache remains an ongoing problem.  He may benefit from transitioning from lasix to thiazide diuretic for long-term management of hypertension.  No current plan for ischemic evaluation but patient will benefit from future ischemic workup given echo as above. Low EF of 30-35% and could consider Entresto as an outpatient if renal function allows. Daily BMP.  Atrial flutter --Maintaining sinus rhythm following TEE/DCCV.  Continue Xarelto and Coreg. Daily CBC/   HTN --Overnight severe HA with head CT negative and transfer back to ICU. As previously noted, screen for RAS (tudy pending) and consider hypertensive heart disease as underlying patient cardiomyopathy.  Consider additional work-up for secondary hypertension if blood pressure remains elevated as patient is on multiple antihypertensive agents.  As previously noted, would need to discontinue spironolactone and allow for washout in order to assess serum aldosterone and plasma renin activity.  He may also benefit from transitioning from furosemide to thiazide diuretic for long-term management of hypertension.   For questions or updates, please contact [place] Please consult [email or web address] for contact info under        Signed, Arvil Chaco, PA-C  04/06/2019, 11:40 AM

## 2019-04-06 NOTE — Progress Notes (Addendum)
Patients BP 188/105 despite receiving IV hydralazine 20 mg and PO hydralazine 100 mg. Patient complaining of 10/10 headache. NP Seals notified. Verbal orders given for IV labetalol 10 mg, IV Morphine 2 mg now. Orders placed by this RN, Will administer and continue to monitor.   Update: Follow up BP 196/115. Patient continues to have headache. NP Seals notified. Orders given for Head CT and 10 mg IV hydralazine.  Update: Patients BP 195/102 after receiving IV hydralazine 10 mg. Patient continues to complain of headache. NP Seals notified. MD to place orders to transfer to CCU for Cardene drip.   Update: Patient transferred to CCU 15. Report given to [place], Therapist, sports. Belongings sent with patient. Patient declined having family notified of transfer.  Gerald [name]

## 2019-04-06 NOTE — Progress Notes (Signed)
Returned to ICU this AM with severe hypertension with the intention of initiating nicardipine infusion.  However, blood pressure had improved significantly by the time he had arrived to the ICU (he had received hydralazine prior to transfer).  This morning, no new complaints.  He has a persistent headache (which is likely due to Imdur).  Renal artery duplex scan has been performed today.  Vitals:   04/06/19 0908 04/06/19 1000 04/06/19 1100 04/06/19 1200  BP: (!) 160/79 (!) 149/90 114/70 (!) 147/82  Pulse:  86 75 66  Resp:  (!) 24 19 18   Temp:      TempSrc:      SpO2:  96% 97% 98%  Weight:      Height:      RA  Gen: NAD HEENT: NCAT, sclerae white Neck: No JVD Lungs: breath sounds full, no wheezes or other adventitious sounds Cardiovascular: RRR, no murmurs Abdomen: Soft, nontender, normal BS Ext: without clubbing, cyanosis, edema Neuro: grossly intact Skin: Limited exam, no lesions noted    BMP Latest Ref Rng & Units 04/06/2019 04/05/2019 04/04/2019  Glucose 70 - 99 mg/dL 106(H) 99 90  BUN 6 - 20 mg/dL 17 20 25(H)  Creatinine 0.61 - 1.24 mg/dL 1.29(H) 1.36(H) 1.50(H)  Sodium 135 - 145 mmol/L 138 137 138  Potassium 3.5 - 5.1 mmol/L 3.9 3.4(L) 3.1(L)  Chloride 98 - 111 mmol/L 106 105 97(L)  CO2 22 - 32 mmol/L 24 24 26   Calcium 8.9 - 10.3 mg/dL 9.1 8.7(L) 8.6(L)   CBC Latest Ref Rng & Units 04/06/2019 04/05/2019 04/04/2019  WBC 4.0 - 10.5 K/uL 6.2 5.6 6.4  Hemoglobin 13.0 - 17.0 g/dL 10.8(L) 9.9(L) 9.4(L)  Hematocrit 39.0 - 52.0 % 35.0(L) 32.0(L) 30.0(L)  Platelets 150 - 400 K/uL 373 335 322    ECHO 08/15: Left Ventricle: The left ventricle has moderate-severely reduced systolic function, with an ejection fraction of 30-35%. The cavity size was mildly dilated. There is severely increased left ventricular wall thickness. Left ventricular diastolic Doppler parameters are indeterminate. Left ventricular diffuse hypokinesis. Right Ventricle: The right ventricle has normal systolic  function. The cavity was normal. There is mildly increased right ventricular wall thickness. Right ventricular systolic pressure is moderately elevated with an estimated pressure of 45.8 mmHg.  CXR: No new film  Renal artery duplex: Directed duplex of the renal arteries demonstrates no high-grade stenosis. Increased echogenicity of the bilateral renal cortex suggesting medical renal disease. No hydronephrosis.  IMPRESSION: Acute hypoxemic respiratory failure due to acute pulmonary edema, resolved Atrial fib-flutter with variable block.  Holding NSR S/P DCCV 08/17 Severe hypertension - very difficult to control Hypertensive cardiomyopathy with severe LVH and LVEF 35% AKI on likely CKD, nonoliguric. Cr improving Hypokalemia due to loop diuresis, resolved Anemia without overt blood loss  PLAN/REC: Continue supplemental oxygen as needed to maintain SPO2 >92% Cont rivaroxaban -initiated 8/15 Continue carvedilol, hydralazine, isosorbide mononitrate, losartan, furosemide, spironolactone Add amlodipine 10 mg daily 8/19 Continue IV hydralazine as needed to maintain SBP <160 mmHg Monitor BMET intermittently Monitor I/Os Correct electrolytes as indicated Begin daily potassium supplement as on furosemide Transfer back to MedSurg floor with cardiac monitoring 8/19  After transfer, PCCM will sign off. Please call if we can be of further assistance  Merton Border, MD PCCM service Mobile [phone] Pager [phone] 04/06/2019 1:51 PM

## 2019-04-06 NOTE — Plan of Care (Signed)
Nutrition Education Note  RD consulted for nutrition education regarding CHF.  Met with patient at bedside. He reports he has a good appetite and intake at baseline. He is hungry now and waiting on lunch tray. Patient reports he is very familiar with low sodium diet and has been following it for a while now. He limits processed foods and does not cook with salt or use salt at the table. He is not ordered for a fluid restriction and reports he has not been previously told he has to restrict fluids. Patient reports he has a digital scale at home but does not weigh regularly. Encouraged him to weigh every morning and write down weight so he can notice trends from fluid retention.  RD provided "Heart Failure Nutrition Therapy" handout from the Academy of [employer]. Reviewed patient's dietary recall. Provided examples on ways to decrease sodium intake in diet. Discouraged intake of processed foods and use of salt shaker. Encouraged fresh fruits and vegetables as well as whole grain sources of carbohydrates to maximize fiber intake.   RD discussed why it is important for patient to adhere to diet recommendations, and emphasized the role of fluids, foods to avoid, and importance of weighing self daily. Teach back method used.  Expect good compliance.  Body mass index is 36.9 kg/m. Pt meets criteria for obesity class II based on current BMI.  Current diet order is heart healthy, patient is consuming approximately 90-100% of meals at this time. Labs and medications reviewed. No further nutrition interventions warranted at this time. RD contact information provided. If additional nutrition issues arise, please re-consult RD.   Willey Blade, MS, [place], LDN Office: [phone] Pager: [phone] After Hours/Weekend Pager: [phone]

## 2019-04-06 NOTE — Progress Notes (Signed)
This patient continues to be hypertensive with blood pressure greater than 99991111 systolically over 99991111 diastolic despite scheduled antihypertensives as well as PRN hydralazine and labetalol.  He continues to complain of severe headache.  CT brain is pending.  Will transfer patient to the ICU for Cardene infusion.  I have spoke with the intensivist for transfer of care handoff.

## 2019-04-07 NOTE — Progress Notes (Signed)
Progress Note  Patient Name: Gerald [name] Date of Encounter: 04/07/2019  Primary Cardiologist: Dr. Rockey Situ  Subjective   No chest pain. No SOB. Slight headache with patient stating he feels it occurs when due for his antihypertensive medications. Continues to feel fatigued.  Inpatient Medications    Scheduled Meds:  amLODipine  10 mg Oral Daily   aspirin EC  81 mg Oral Daily   atorvastatin  40 mg Oral q1800   carvedilol  25 mg Oral BID WC   Chlorhexidine Gluconate Cloth  6 each Topical Q0600   hydrALAZINE  100 mg Oral Q8H   hydrochlorothiazide  25 mg Oral Daily   isosorbide mononitrate  30 mg Oral Daily   losartan  100 mg Oral Daily   potassium chloride  20 mEq Oral Daily   rivaroxaban  20 mg Oral Q supper   sodium chloride flush  10-40 mL Intracatheter Q12H   spironolactone  50 mg Oral Daily   Continuous Infusions:  sodium chloride     PRN Meds: sodium chloride, acetaminophen **OR** acetaminophen, ALPRAZolam, hydrALAZINE, HYDROmorphone (DILAUDID) injection, metoprolol tartrate, [DISCONTINUED] ondansetron **OR** ondansetron (ZOFRAN) IV, oxyCODONE, sodium chloride flush   Vital Signs    Vitals:   04/07/19 0456 04/07/19 0645 04/07/19 0803 04/07/19 0937  BP: (!) 187/103 (!) 161/109 (!) 196/113 (!) 158/91  Pulse: 76 72 75 62  Resp: 20     Temp: 98.7 F (37.1 C)     TempSrc:      SpO2: 100%     Weight:      Height:        Intake/Output Summary (Last 24 hours) at 04/07/2019 1529 Last data filed at 04/07/2019 1406 Gross per 24 hour  Intake 240 ml  Output 3025 ml  Net -2785 ml   Last 3 Weights 04/06/2019 04/06/2019 04/05/2019  Weight (lbs) 264 lb 8.8 oz 276 lb 14.4 oz 276 lb 14.4 oz  Weight (kg) 120 kg 125.6 kg 125.601 kg      Telemetry    SR  - Personally Reviewed  ECG    No new tracings- Personally Reviewed  Physical Exam   GEN: Somewhat somnolent male in NAD Neck: JVD difficult to assess Cardiac: RRR, no murmurs, rubs, or gallops.    Respiratory: Clear to auscultation bilaterally. GI: Soft, nontender, non-distended  MS: Minimal non-pitting b/l lower extremity edema; No deformity. Neuro:  Nonfocal  Psych: Normal affect   Labs    High Sensitivity Troponin:   Recent Labs  Lab 04/01/19 0603 04/01/19 0827 04/06/19 0610 04/06/19 0738  TROPONINIHS 298* 298* 223* 229*      Cardiac EnzymesNo results for input(s): TROPONINI in the last 168 hours. No results for input(s): TROPIPOC in the last 168 hours.   Chemistry Recent Labs  Lab 04/01/19 0603  04/03/19 0414 04/04/19 0512 04/05/19 0520 04/06/19 0610  NA 139   < > 137 138 137 138  K 2.6*   < > 3.0* 3.1* 3.4* 3.9  CL 104   < > 104 97* 105 106  CO2 25   < > 25 26 24 24   GLUCOSE 117*   < > 100* 90 99 106*  BUN 24*   < > 23* 25* 20 17  CREATININE 1.64*   < > 1.41* 1.50* 1.36* 1.29*  CALCIUM 8.6*   < > 8.6* 8.6* 8.7* 9.1  PROT 6.7  --  6.2*  --   --   --   ALBUMIN 3.1*  --  3.0*  --   --   --  AST 28  --  25  --   --   --   ALT 59*  --  46*  --   --   --   ALKPHOS 106  --  97  --   --   --   BILITOT 1.0  --  0.7  --   --   --   GFRNONAA 52*   < > >60 58* >60 >60  GFRAA >60   < > >60 >60 >60 >60  ANIONGAP 10   < > 8 15 8 8    < > = values in this interval not displayed.     Hematology Recent Labs  Lab 04/04/19 0512 04/05/19 0520 04/06/19 0610  WBC 6.4 5.6 6.2  RBC 4.49 4.78 5.22  HGB 9.4* 9.9* 10.8*  HCT 30.0* 32.0* 35.0*  MCV 66.8* 66.9* 67.0*  MCH 20.9* 20.7* 20.7*  MCHC 31.3 30.9 30.9  RDW 16.7* 16.7* 17.0*  PLT 322 335 373    BNP Recent Labs  Lab 04/01/19 0603  BNP 1,059.0*     DDimer No results for input(s): DDIMER in the last 168 hours.   Radiology    Ct Head Wo Contrast  Result Date: 04/06/2019 CLINICAL DATA:  Acute severe headache. EXAM: CT HEAD WITHOUT CONTRAST TECHNIQUE: Contiguous axial images were obtained from the base of the skull through the vertex without intravenous contrast. COMPARISON:  None. FINDINGS: Brain: No  evidence of acute infarction, hemorrhage, hydrocephalus, extra-axial collection or mass lesion/mass effect. 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 Vascular: Hyperdense vessel. Skull: No fracture or focal lesion. Sinuses/Orbits: Paranasal sinuses and mastoid air cells are clear. The visualized orbits are unremarkable. Other: None. IMPRESSION: 1. No acute intracranial abnormality. 2. 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 Electronically Signed   By: Keith Rake M.D.   On: 04/06/2019 03:48   US Renal Artery Duplex Complete  Result Date: 04/06/2019 CLINICAL DATA:  38 year old male with hypertension EXAM: RENAL/URINARY TRACT ULTRASOUND RENAL DUPLEX ULTRASOUND COMPARISON:  None. FINDINGS: Right Kidney: Length: 12.3 cm. Cortex of the right kidney echogenic compared to the adjacent liver parenchyma. No hydronephrosis Left Kidney: Length: 11.1 cm. Cortex of the left kidney is echogenic, symmetric to the right. No hydronephrosis. Bladder:  Unremarkable RENAL DUPLEX ULTRASOUND Right Renal Artery Velocities: Origin:  105 cm/sec Mid:  81 cm/sec Hilum:  79 cm/sec Interlobar:  23 cm/sec Arcuate:  40 cm/sec Left Renal Artery Velocities: Origin:  47 cm/sec Mid:  73 cm/sec Hilum:  73 cm/sec Interlobar:  20 cm/sec Arcuate:  23 cm/sec Aortic Velocity:  79 cm/sec Right Renal-Aortic Ratios: Origin: 1.3 Mid:  1.0 Hilum: 1.0 Interlobar: 0.3 Arcuate: 0.5 Left Renal-Aortic Ratios: Origin: 0.6 Mid: 0.9 Hilum: 0.9 Interlobar: 0.3 Arcuate: 0.3 IMPRESSION: Directed duplex of the renal arteries demonstrates no high-grade stenosis. Increased echogenicity of the bilateral renal cortex suggesting medical renal disease. No hydronephrosis. Signed, Dulcy Fanny. Dellia Nims, RPVI [facility] Radiology Electronically Signed   By: Corrie Mckusick D.O.   On: 04/06/2019 12:13    Cardiac Studies   Echo (04/02/2019): 1. The left ventricle has moderate-severely reduced systolic function, with  an ejection fraction of 30-35%. The cavity size was mildly dilated. There is severely increased left ventricular wall thickness. Left ventricular diastolic Doppler parameters  are indeterminate. Left ventricular diffuse hypokinesis. 2. The right ventricle has normal systolic function. The cavity was normal. There is mildly increased right ventricular wall thickness. Right ventricular systolic pressure is moderately elevated with an  estimated pressure of 45.8 mmHg. 3. Mitral valve regurgitation is moderate 4. Tricuspid valve regurgitation is moderate. 5. Rhythm is atrial flutter, ventricularrate 70 bpm  Patient Profile     38 y.o. male with a history of acute on chronic systolic and diastolic heart failure, complicated by atrial flutter.  Assessment & Plan    Acute on chronic systolic and diastolic heart failure --No shortness of breath. No acute findings on physical exam with mild non-pitting bilateral edema on exam as noted previously.   --Continue HCTZ and multiple antihypertensive agents including amlodipine, carvedilol, PRN lopressor, hydralazine, Imdur, and losartan. BP poorly controlled at this time.  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 Secondary HTN workup pending per IM. Will defer ischemic workup as directly below.  --Could consider transition from losartan to [facility] as an outpatient and addition of spironolactone as renal function and potassium allow. If start Entresto and spironolactone, adjust potassium supplementation at that time. Daily BMP.  Elevated Troponin without chest pain  --No chest pain. Will defer ischemic evaluation at this time and pending optimization of medical management / BP control. Troponin has been flat trending and consistent with supply demand mismatch, likely due to poorly controlled BP and acute on chronic heart failure. Would benefit from future ischemic workup as an outpatient. Continue statin.  Atrial flutter --Maintaining sinus  rhythm following TEE/DCCV.  Continue Xarelto and Coreg. Daily CBC.   HTN, poorly controlled  --Secondary etiology of HTN per IM with metanephrines lab pending results. Renal artery duplex without high grade stenosis. --Current BP 158/91. HR 60-70s.  Continue medical management. --Suspect headache reported as 2/2 poorly controlled BP as patient indicated today that he has a HA when he is due for his antihypertensive medication, at which point it resolves. PTA medications include Imdur; therefore, headache likely not due to Imdur.   HLD --Continue statin  For questions or updates, please contact [place] Please consult [email or web address] for contact info under        Signed, Arvil Chaco, PA-C  04/07/2019, 3:29 PM

## 2019-04-07 NOTE — Progress Notes (Signed)
PICC line d/c per order due to occluded.  Pt verbalizes understanding of instructions and to stay in bed for 30 minutes, then call for assistance getting up.  Site without signs of infection, vaseline guaze and dry 2x2 applied to site, pressure held, no bleeding noted.

## 2019-04-07 NOTE — Progress Notes (Signed)
[place] [facility] [place] NAME: Gerald [name]    MR#:  [id number]  DATE OF BIRTH:  [birth year]  SUBJECTIVE: l headache is decreased, blood pressure also improved.  CHIEF COMPLAINT:   Chief Complaint  Patient presents with  . Shortness of Breath   Headache, transferred again to high blood pressure and headache.  Head CAT scan showed no acute bleed, which showed remote stroke, discussed this with patient. REVIEW OF SYSTEMS:    Review of Systems  Constitutional: Negative for chills, fever and malaise/fatigue.  HENT: Negative for sore throat.   Eyes: Negative for blurred vision, double vision and pain.  Respiratory: Negative for cough, hemoptysis, shortness of breath and wheezing.   Cardiovascular: Negative for chest pain, palpitations, orthopnea and leg swelling.  Gastrointestinal: Negative for abdominal pain, constipation, diarrhea, heartburn, nausea and vomiting.  Genitourinary: Negative for dysuria and hematuria.  Musculoskeletal: Negative for back pain and joint pain.  Skin: Negative for rash.  Neurological: Negative for sensory change, speech change, focal weakness and headaches.  Endo/Heme/Allergies: Does not bruise/bleed easily.  Psychiatric/Behavioral: Negative for depression. The patient is not nervous/anxious.    DRUG ALLERGIES:  No Known Allergies  VITALS:  Blood pressure (!) 158/91, pulse 62, temperature 98.7 F (37.1 C), resp. rate 20, height 5\' 11"  (1.803 m), weight 120 kg, SpO2 100 %.  PHYSICAL EXAMINATION:   Physical Exam  GENERAL:  38 y.o.-year-old patient lying in the bed with no acute distress.  Obese.  Conversational dyspnea EYES: Pupils equal, round, reactive to light  No scleral icterus. Extraocular muscles intact.  HEENT: Head atraumatic, normocephalic. Oropharynx and nasopharynx clear.  NECK:  Supple, no jugular venous distention. No thyroid enlargement, no tenderness.  LUNGS: Decreased air entry  CARDIOVASCULAR:  Irregularly irregular ABDOMEN: Soft, nontender, nondistended. Bowel sounds present. No organomegaly or mass.  EXTREMITIES: No cyanosis, clubbing or edema b/l.    NEUROLOGIC: Cranial nerves II through XII are intact. No focal Motor or sensory deficits b/l.   PSYCHIATRIC: The patient is alert and oriented x 3.  SKIN: No obvious rash, lesion, or ulcer.   LABORATORY PANEL:   CBC Recent Labs  Lab 04/06/19 0610  WBC 6.2  HGB 10.8*  HCT 35.0*  PLT 373   ------------------------------------------------------------------------------------------------------------------ Chemistries  Recent Labs  Lab 04/03/19 0414 04/04/19 0512  04/06/19 0610  NA 137 138   < > 138  K 3.0* 3.1*   < > 3.9  CL 104 97*   < > 106  CO2 25 26   < > 24  GLUCOSE 100* 90   < > 106*  BUN 23* 25*   < > 17  CREATININE 1.41* 1.50*   < > 1.29*  CALCIUM 8.6* 8.6*   < > 9.1  MG 1.9 2.0  --   --   AST 25  --   --   --   ALT 46*  --   --   --   ALKPHOS 97  --   --   --   BILITOT 0.7  --   --   --    < > = values in this interval not displayed.   ------------------------------------------------------------------------------------------------------------------  Cardiac Enzymes No results for input(s): TROPONINI in the last 168 hours. ------------------------------------------------------------------------------------------------------------------  RADIOLOGY:  Ct Head Wo Contrast  Result Date: 04/06/2019 CLINICAL DATA:  Acute severe headache. EXAM: CT HEAD WITHOUT CONTRAST TECHNIQUE: Contiguous axial images were obtained from the base of the skull through the vertex without intravenous  contrast. COMPARISON:  None. FINDINGS: Brain: No evidence of acute infarction, hemorrhage, hydrocephalus, extra-axial collection or mass lesion/mass effect. 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 Vascular: Hyperdense vessel. Skull: No fracture or focal lesion. Sinuses/Orbits: Paranasal sinuses and mastoid air cells are clear. The  visualized orbits are unremarkable. Other: None. IMPRESSION: 1. No acute intracranial abnormality. 2. 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 Electronically Signed   By: Keith Rake M.D.   On: 04/06/2019 03:48   US Renal Artery Duplex Complete  Result Date: 04/06/2019 CLINICAL DATA:  38 year old male with hypertension EXAM: RENAL/URINARY TRACT ULTRASOUND RENAL DUPLEX ULTRASOUND COMPARISON:  None. FINDINGS: Right Kidney: Length: 12.3 cm. Cortex of the right kidney echogenic compared to the adjacent liver parenchyma. No hydronephrosis Left Kidney: Length: 11.1 cm. Cortex of the left kidney is echogenic, symmetric to the right. No hydronephrosis. Bladder:  Unremarkable RENAL DUPLEX ULTRASOUND Right Renal Artery Velocities: Origin:  105 cm/sec Mid:  81 cm/sec Hilum:  79 cm/sec Interlobar:  23 cm/sec Arcuate:  40 cm/sec Left Renal Artery Velocities: Origin:  47 cm/sec Mid:  73 cm/sec Hilum:  73 cm/sec Interlobar:  20 cm/sec Arcuate:  23 cm/sec Aortic Velocity:  79 cm/sec Right Renal-Aortic Ratios: Origin: 1.3 Mid:  1.0 Hilum: 1.0 Interlobar: 0.3 Arcuate: 0.5 Left Renal-Aortic Ratios: Origin: 0.6 Mid: 0.9 Hilum: 0.9 Interlobar: 0.3 Arcuate: 0.3 IMPRESSION: Directed duplex of the renal arteries demonstrates no high-grade stenosis. Increased echogenicity of the bilateral renal cortex suggesting medical renal disease. No hydronephrosis. Signed, Dulcy Fanny. Dellia Nims, RPVI [facility] Radiology Electronically Signed   By: Corrie Mckusick D.O.   On: 04/06/2019 12:13   ASSESSMENT AND PLAN:   *Acute on chronic systolic congestive heart failure -Continue Lasix, metoprolol, cardiology is thinking about starting Entresto as an outpatient. - I- Counseled to limit fluids and Salt - Monitor Bun/Cr and Potassium - Echo-ejection fraction 30 to 35% -Cardiology follow up.  Continue spironolactone, losartan.,  Coreg, Patient headache can be multifactorial secondary to  her high blood pressure and also starting new medication including Coreg, spironolactone, losartan.    .  Patient is on Dilaudid 0.5 mg every 3 hours IV for pain control along with oxycodone.  Discontinue IV Dilaudid, continue oxycodone discharge planning hopefully tomorrow with oral oxycodone for headache as needed while patient is getting adjusted to multiple heart failure medicines. *Acute hypoxic respiratory failure secondary to CHF   Resolved  *Atrial fib/flutter.  Status post cardioversion , now in sinus rhythm, continue anticoagulation with Xarelto,  *Malignant hypertension, ; BP still high.  IV hydralazine as needed, continue present BP medicines.  all the records are reviewed and case discussed with Care Management/Social Worker Management plans discussed with the patient, family and they are in agreement.  CODE STATUS: FULL CODE  TOTAL TIME TAKING CARE OF THIS PATIENT: 35 minutes.  More than 50% time spent in counseling, coordination of care POSSIBLE D/C IN 1-2 DAYS, DEPENDING ON CLINICAL CONDITION.  Epifanio Lesches M.D on 04/07/2019 at 3:15 PM  Between 7am to 6pm - Pager - [phone]  After 6pm go to [email or web address] - password [email or web address] [facility]  Office  [phone]  CC: Primary care physician; Patient, [name]  Note: This dictation was prepared with Dragon dictation along with smaller phrase technology. Any transcriptional errors that result from this process are unintentional.

## 2019-04-07 NOTE — TOC Progression Note (Signed)
Transition of Care (TOC) - Progression Note    Patient Details  Name: Gerald [name] MRN: [id number] Date of Birth: 01/31/1981  Transition of Care (TOC) CM/SW Contact  ,  M, LCSW Phone Number: [phone]  04/07/2019, 3:03 PM  Clinical Narrative: Clinical Social Worker (CSW) met with patient and provided him the [place] [employer] [facility] and [place] [facility] contact information at his request. CSW made patient aware that both housing authorities have long waiting list. CSW provided patient with [facility] and Medication Management application. Patient asked how he can apply for medicaid. CSW explained that he can apply for medicaid through the department of social services (DSS). CSW provided patient with contact information for [place] DSS. Patient reported no other needs or concerns. CSW will continue to follow and assist as needed.      Expected Discharge Plan: Home/Self Care Barriers to Discharge: Continued Medical Work up  Expected Discharge Plan and Services Expected Discharge Plan: Home/Self Care     Post Acute Care Choice: NA Living arrangements for the past 2 months: Single Family Home                                       Social Determinants of Health (SDOH) Interventions    Readmission Risk Interventions No flowsheet data found.  

## 2019-04-08 LAB — BASIC METABOLIC PANEL
Anion gap: 12 (ref 5–15)
BUN: 19 mg/dL (ref 6–20)
CO2: 25 mmol/L (ref 22–32)
Calcium: 9.2 mg/dL (ref 8.9–10.3)
Chloride: 101 mmol/L (ref 98–111)
Creatinine, Ser: 1.57 mg/dL — ABNORMAL HIGH (ref 0.61–1.24)
GFR calc Af Amer: >60 mL/min (ref >60)
GFR calc non Af Amer: 55 mL/min — ABNORMAL LOW (ref >60)
Glucose, Bld: 95 mg/dL (ref 70–99)
Potassium: 4.3 mmol/L (ref 3.5–5.1)
Sodium: 138 mmol/L (ref 135–145)

## 2019-04-08 LAB — CBC
HCT: 36.5 % — ABNORMAL LOW (ref 39.0–52.0)
Hemoglobin: 11.3 g/dL — ABNORMAL LOW (ref 13.0–17.0)
MCH: 20.6 pg — ABNORMAL LOW (ref 26.0–34.0)
MCHC: 31 g/dL (ref 30.0–36.0)
MCV: 66.6 fL — ABNORMAL LOW (ref 80.0–100.0)
Platelets: 379 10*3/uL (ref 150–400)
RBC: 5.48 MIL/uL (ref 4.22–5.81)
RDW: 16.9 % — ABNORMAL HIGH (ref 11.5–15.5)
WBC: 6.2 10*3/uL (ref 4.0–10.5)
nRBC: 0 % (ref 0.0–0.2)

## 2019-04-08 MED ORDER — CLONIDINE HCL 0.1 MG PO TABS
0.1000 mg | ORAL_TABLET | Freq: Two times a day (BID) | ORAL | Status: DC
  Administered 2019-04-08 – 2019-04-09 (×2): 0.1 mg via ORAL
  Filled 2019-04-08 (×3): qty 1

## 2019-04-08 NOTE — Progress Notes (Addendum)
Progress Note  Patient Name: Gerald [name] Date of Encounter: 04/08/2019  Primary Cardiologist: Dr. Rockey Situ  Subjective   No chest pain.  No shortness of breath or palpitations since being back in sinus rhythm.  Continues to complain of headache.    Stated that he feels his headache is less intense in severity when his blood pressure medications are spread out, rather than taken all at once.    Blood pressure continues to be suboptimally controlled on multiple antihypertensives.  We discussed the research he's done since hospitalized on cardiac conditions, as well as his positive attitude regarding making healthy lifestyle choices and prioritizing his health.  Inpatient Medications    Scheduled Meds: . amLODipine  10 mg Oral Daily  . aspirin EC  81 mg Oral Daily  . atorvastatin  40 mg Oral q1800  . carvedilol  25 mg Oral BID WC  . Chlorhexidine Gluconate Cloth  6 each Topical Q0600  . hydrALAZINE  100 mg Oral Q8H  . hydrochlorothiazide  25 mg Oral Daily  . isosorbide mononitrate  30 mg Oral Daily  . losartan  100 mg Oral Daily  . potassium chloride  20 mEq Oral Daily  . rivaroxaban  20 mg Oral Q supper  . sodium chloride flush  10-40 mL Intracatheter Q12H  . spironolactone  50 mg Oral Daily   Continuous Infusions: . sodium chloride     PRN Meds: sodium chloride, acetaminophen **OR** acetaminophen, ALPRAZolam, hydrALAZINE, HYDROmorphone (DILAUDID) injection, metoprolol tartrate, [DISCONTINUED] ondansetron **OR** ondansetron (ZOFRAN) IV, oxyCODONE, sodium chloride flush   Vital Signs    Vitals:   04/07/19 0937 04/07/19 1704 04/07/19 2011 04/08/19 0515  BP: (!) 158/91 (!) 153/83 (!) 165/98 (!) 164/100  Pulse: 62 75 72 69  Resp:  12 15 20   Temp:  98.7 F (37.1 C) 98.1 F (36.7 C) 98.4 F (36.9 C)  TempSrc:  Oral Oral Oral  SpO2:  100% 100% 100%  Weight:      Height:        Intake/Output Summary (Last 24 hours) at 04/08/2019 1232 Last data filed at 04/08/2019  0900 Gross per 24 hour  Intake 360 ml  Output 2250 ml  Net -1890 ml   Last 3 Weights 04/06/2019 04/06/2019 04/05/2019  Weight (lbs) 264 lb 8.8 oz 276 lb 14.4 oz 276 lb 14.4 oz  Weight (kg) 120 kg 125.6 kg 125.601 kg      Telemetry    SR  - Personally Reviewed  ECG    No new tracings- Personally Reviewed  Physical Exam   GEN: 38 yo male in NAD, watching television.  More alert and oriented today. Neck: JVD difficult to assess Cardiac: RRR, no murmurs, rubs, or gallops.  Respiratory: Clear to auscultation bilaterally. GI: Soft, nontender, non-distended  MS:  Trace to no b/l lower extremity edema, significantly improved from yesterday's non-pitting edema; No deformity. Neuro:  Nonfocal  Psych: Normal affect, pleasant   Labs    High Sensitivity Troponin:   Recent Labs  Lab 04/01/19 0603 04/01/19 0827 04/06/19 0610 04/06/19 0738  TROPONINIHS 298* 298* 223* 229*      Cardiac EnzymesNo results for input(s): TROPONINI in the last 168 hours. No results for input(s): TROPIPOC in the last 168 hours.   Chemistry Recent Labs  Lab 04/03/19 0414 04/04/19 0512 04/05/19 0520 04/06/19 0610  NA 137 138 137 138  K 3.0* 3.1* 3.4* 3.9  CL 104 97* 105 106  CO2 25 26 24 24   GLUCOSE  100* 90 99 106*  BUN 23* 25* 20 17  CREATININE 1.41* 1.50* 1.36* 1.29*  CALCIUM 8.6* 8.6* 8.7* 9.1  PROT 6.2*  --   --   --   ALBUMIN 3.0*  --   --   --   AST 25  --   --   --   ALT 46*  --   --   --   ALKPHOS 97  --   --   --   BILITOT 0.7  --   --   --   GFRNONAA >60 58* >60 >60  GFRAA >60 >60 >60 >60  ANIONGAP 8 15 8 8      Hematology Recent Labs  Lab 04/04/19 0512 04/05/19 0520 04/06/19 0610  WBC 6.4 5.6 6.2  RBC 4.49 4.78 5.22  HGB 9.4* 9.9* 10.8*  HCT 30.0* 32.0* 35.0*  MCV 66.8* 66.9* 67.0*  MCH 20.9* 20.7* 20.7*  MCHC 31.3 30.9 30.9  RDW 16.7* 16.7* 17.0*  PLT 322 335 373    BNP No results for input(s): BNP, PROBNP in the last 168 hours.   DDimer No results for  input(s): DDIMER in the last 168 hours.   Radiology    No results found.  Cardiac Studies   Echo (04/02/2019): 1. The left ventricle has moderate-severely reduced systolic function, with an ejection fraction of 30-35%. The cavity size was mildly dilated. There is severely increased left ventricular wall thickness. Left ventricular diastolic Doppler parameters  are indeterminate. Left ventricular diffuse hypokinesis. 2. The right ventricle has normal systolic function. The cavity was normal. There is mildly increased right ventricular wall thickness. Right ventricular systolic pressure is moderately elevated with an estimated pressure of 45.8 mmHg. 3. Mitral valve regurgitation is moderate 4. Tricuspid valve regurgitation is moderate. 5. Rhythm is atrial flutter, ventricularrate 70 bpm  Patient Profile     38 y.o. male with a history of acute on chronic systolic and diastolic heart failure, complicated by atrial flutter.  Assessment & Plan    Acute on chronic systolic and diastolic heart failure --No shortness of breath. No acute findings on physical exam.   --Continue HCTZ and multiple antihypertensive agents including amlodipine, carvedilol, PRN lopressor, spironolactone, hydralazine, Imdur, and losartan. BP suboptimally controlled and will continue to monitor both here and as an outpatient.  --Patient reported he has never had headache before on Imdur at home. He feels HA improves if medications are spread out as above. 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 Secondary HTN workup pending per IM. No plans for ischemic workup this admission as below. --Could consider transition from losartan to [facility] as an outpatient as renal function allows. Daily BMP (not ordered yet today).  Elevated Troponin without chest pain  --No chest pain. Troponin has been flat trending and consistent with supply demand mismatch, likely due to poorly controlled BP and acute on chronic  heart failure. Would benefit from future ischemic workup as an outpatient. Continue statin.  Atrial flutter --Maintaining sinus rhythm following TEE/DCCV.   --Continue Xarelto and Coreg.  --Daily CBC (not yet ordered).   HTN, poorly controlled  --Secondary etiology of HTN per IM with metanephrines lab pending results. Renal artery duplex without high grade stenosis. --Continue medical management as above.  HLD --Continue statin  Anemia --Daily CBC. Consider anemia of chronic disease. Recommend transfuse below 8.0 and further workup to r/o bleed at that time as patient will need discharged on [facility].  For questions or updates, please contact [place] Please consult [email or web address] for  contact info under        Signed, Arvil Chaco, PA-C  04/08/2019, 12:32 PM

## 2019-04-08 NOTE — Progress Notes (Signed)
[place] [facility] [place] NAME: Gerald [name] Gerald [name] [name]    MR#:  [id number]  DATE OF BIRTH:  04/13/1981  Subjective: Headaches are less.  CHIEF COMPLAINT:   Chief Complaint  Gerald [name] [name] presents with  . Shortness of Breath  Blood pressure is still high.  REVIEW OF SYSTEMS:    Review of Systems  Constitutional: Negative for chills, fever and malaise/fatigue.  HENT: Negative for sore throat.   Eyes: Negative for blurred vision, double vision and pain.  Respiratory: Negative for cough, hemoptysis, shortness of breath and wheezing.   Cardiovascular: Negative for chest pain, palpitations, orthopnea and leg swelling.  Gastrointestinal: Negative for abdominal pain, constipation, diarrhea, heartburn, nausea and vomiting.  Genitourinary: Negative for dysuria and hematuria.  Musculoskeletal: Negative for back pain and joint pain.  Skin: Negative for rash.  Neurological: Negative for sensory change, speech change, focal weakness and headaches.  Endo/Heme/Allergies: Does not bruise/bleed easily.  Psychiatric/Behavioral: Negative for depression. Gerald [name] Gerald [name] [name] is not nervous/anxious.    DRUG ALLERGIES:  No Known Allergies  VITALS:  Blood pressure (!) 164/100, pulse 69, temperature 98.4 F (36.9 C), temperature source Oral, resp. rate 20, height 5\' 11"  (1.803 m), weight 120 kg, SpO2 100 %.  PHYSICAL EXAMINATION:   Physical Exam  GENERAL:  38 y.o.-year-old Gerald [name] [name] lying in Gerald [name] bed with no acute distress.  Obese.  Conversational dyspnea EYES: Pupils equal, round, reactive to light  No scleral icterus. Extraocular muscles intact.  HEENT: Head atraumatic, normocephalic. Oropharynx and nasopharynx clear.  NECK:  Supple, no jugular venous distention. No thyroid enlargement, no tenderness.  LUNGS: Decreased air entry  CARDIOVASCULAR: Irregularly irregular ABDOMEN: Soft, nontender, nondistended. Bowel sounds present. No organomegaly or mass.  EXTREMITIES: No cyanosis,  clubbing or edema b/l.    NEUROLOGIC: Cranial nerves II through XII are intact. No focal Motor or sensory deficits b/l.   PSYCHIATRIC: Gerald [name] Gerald [name] [name] is alert and oriented x 3.  SKIN: No obvious rash, lesion, or ulcer.   LABORATORY PANEL:   CBC Recent Labs  Lab 04/06/19 0610  WBC 6.2  HGB 10.8*  HCT 35.0*  PLT 373   ------------------------------------------------------------------------------------------------------------------ Chemistries  Recent Labs  Lab 04/03/19 0414 04/04/19 0512  04/06/19 0610  NA 137 138   < > 138  K 3.0* 3.1*   < > 3.9  CL 104 97*   < > 106  CO2 25 26   < > 24  GLUCOSE 100* 90   < > 106*  BUN 23* 25*   < > 17  CREATININE 1.41* 1.50*   < > 1.29*  CALCIUM 8.6* 8.6*   < > 9.1  MG 1.9 2.0  --   --   AST 25  --   --   --   ALT 46*  --   --   --   ALKPHOS 97  --   --   --   BILITOT 0.7  --   --   --    < > = values in this interval not displayed.   ------------------------------------------------------------------------------------------------------------------  Cardiac Enzymes No results for input(s): TROPONINI in Gerald [name] last 168 hours. ------------------------------------------------------------------------------------------------------------------  RADIOLOGY:  No results found. ASSESSMENT AND PLAN:   *Acute on chronic systolic congestive heart failure, noncompliance, -Continue Lasix, metoprolol, cardiology is thinking about starting Entresto as an outpatient. -  Counseled to limit fluids and Salt - Monitor Bun/Cr and Potassium - Echo-ejection fraction 30 to 35% -Cardiology follow up.   Continue spironolactone, losartan.,  Coreg, Gerald [name] [name]  headache can be multifactorial secondary to her high blood pressure and also starting new medication including Coreg, spironolactone, losartan.   Gerald [name] [name] is also getting evaluated for causes of secondary hypertension.  .  Gerald [name] [name] is on Dilaudid 0.5 mg every 3 hours IV for pain control along with oxycodone.   Discontinue IV Dilaudid, continue oxycodone discharge planning   *Acute hypoxic respiratory failure secondary to CHF   Resolved  *Atrial fib/flutter.  Status post cardioversion , now in sinus rhythm, continue anticoagulation with Xarelto,  *Malignant hypertension, ; BP still high.,  Secondary etiologies of hypertension with metanephrines, renal artery ultrasound.  Lipids are pending, renal artery duplex without any high-grade stenosis.  Gerald [name] [name] told me today that he would rather stay in [name] hospital as his blood pressure is still high and he is still headache.   all Gerald [name] records are reviewed and case discussed with Care Management/Social Worker Management plans discussed with Gerald [name] Gerald [name] [name], family and they are in agreement.  CODE STATUS: FULL CODE  TOTAL TIME TAKING CARE OF THIS Gerald [name] [name]: 35 minutes.  More than 50% time spent in counseling, coordination of care POSSIBLE D/C IN 1-2 DAYS, DEPENDING ON CLINICAL CONDITION.  Epifanio Lesches M.D on 04/08/2019 at 1:11 PM  Between 7am to 6pm - Pager - [phone]  After 6pm go to [email or web address] - password [email or web address] [facility]  Office  [phone]  CC: Primary care physician; Gerald [name] [name], [name]  Note: This dictation was prepared with Dragon dictation along with smaller phrase technology. Any transcriptional errors that result from this process are unintentional.

## 2019-04-09 MED ORDER — POTASSIUM CHLORIDE CRYS ER 20 MEQ PO TBCR: 20.0000 meq | EXTENDED_RELEASE_TABLET | Freq: Every day | ORAL | 0 refills | Status: DC

## 2019-04-09 MED ORDER — CARVEDILOL 25 MG PO TABS: 25.0000 mg | ORAL_TABLET | Freq: Two times a day (BID) | ORAL | 0 refills | Status: DC

## 2019-04-09 MED ORDER — SPIRONOLACTONE 50 MG PO TABS: 50.0000 mg | ORAL_TABLET | Freq: Every day | ORAL | 0 refills | Status: DC

## 2019-04-09 MED ORDER — HYDROCHLOROTHIAZIDE 25 MG PO TABS: 25.0000 mg | ORAL_TABLET | Freq: Every day | ORAL | 0 refills | Status: DC

## 2019-04-09 MED ORDER — ASPIRIN 81 MG PO TBEC: 81.0000 mg | DELAYED_RELEASE_TABLET | Freq: Every day | ORAL | 0 refills | Status: AC

## 2019-04-09 MED ORDER — AMLODIPINE BESYLATE 10 MG PO TABS: 10.0000 mg | ORAL_TABLET | Freq: Every day | ORAL | 0 refills | Status: DC

## 2019-04-09 MED ORDER — CLONIDINE HCL 0.1 MG PO TABS: 0.1000 mg | ORAL_TABLET | Freq: Two times a day (BID) | ORAL | 11 refills | Status: DC

## 2019-04-09 MED ORDER — LOSARTAN POTASSIUM 100 MG PO TABS: 100.0000 mg | ORAL_TABLET | Freq: Every day | ORAL | 0 refills | Status: DC

## 2019-04-09 MED ORDER — RIVAROXABAN 20 MG PO TABS: 20.0000 mg | ORAL_TABLET | Freq: Every day | ORAL | 0 refills | Status: DC

## 2019-04-09 NOTE — Progress Notes (Signed)
Advanced care plan. Purpose of the Encounter: CODE STATUS Parties in Attendance: Patient Patient's Decision Capacity: Good Subjective/Patient's story:  Gerald [name]  is a 38 y.o. male with a known history of hypertension and med noncompliance who presents with shortness of breath for the last 2 days.  He also has fever.  Patient states that breathing has progressively gotten worse.  He is also had productive cough.  In the emergency room patient was very short of breath therefore had to be placed on BiPAP.  Chest x-ray suggested bilateral pneumonia and he is got low-grade fever.  Patient is COVID is negative. He denies any nausea vomiting or diarrhea. Objective/Medical story Patient needs admission for respiratory distress and control of blood pressure Needs COVID-19 testing Cardiology evaluation for elevated troponin Replacement of electrolytes Goals of care determination:  Advance care directives goals of care treatment plan discussed Patient wants everything done which includes CPR, intubation ventilator if the need arises Was discussed during patient's hospitalization CODE STATUS: Full code Time spent discussing advanced care planning: 16 minutes

## 2019-04-09 NOTE — TOC Transition Note (Signed)
Transition of Care [facility]) - CM/SW Discharge Note   Patient Details  Name: KOVEN WOLFGRAMM MRN: [id number] Date of Birth: 26-Jun-1981  Transition of Care [facility] CM/SW Contact:  Marshell Garfinkel, RN Phone Number: 04/09/2019, 9:24 AM   Clinical Narrative:    [facility] voucher provided to patient to assist with medications needs. Previous RNCM provided application/referral to [facility] and Medications Management.    Final next level of care: Home/Self Care Barriers to Discharge: Continued Medical Work up   Patient Goals and CMS Choice        Discharge Placement                       Discharge Plan and Services In-house Referral: PCP / [facility] Discharge Planning Services: [facility] Post Acute Care Choice: NA                               Social Determinants of Health (SDOH) Interventions     Readmission Risk Interventions No flowsheet data found.

## 2019-04-09 NOTE — Progress Notes (Signed)
Writer went over discharge instructions with patient, patient verbalized understanding.  Patient was transported via wheelchair to medical mall entrance where family member was waiting to take home.

## 2019-04-09 NOTE — Discharge Summary (Signed)
[facility] [place] NAME: Gerald [name]    MR#:  [id number]  DATE OF BIRTH:  May 03, 1981  DATE OF ADMISSION:  04/01/2019 ADMITTING PHYSICIAN: Dustin Flock, MD  DATE OF DISCHARGE: 04/09/2019  PRIMARY CARE PHYSICIAN: Patient, [name]   ADMISSION DIAGNOSIS:  Acute respiratory failure ([name]) [J96.00] Malignant hypertension [I10] Hypokalemia [E87.6] Elevated troponin [R79.89] Acute respiratory failure, unspecified whether with hypoxia or hypercapnia ([place]) [J96.00] Community acquired pneumonia, unspecified laterality [J18.9]  DISCHARGE DIAGNOSIS:  Active Problems:   Acute respiratory failure ([name])   Persistent atrial fibrillation   Acute systolic heart failure ([name])   Acute on chronic combined systolic and diastolic CHF (congestive heart failure) ([name])   Atrial flutter ([place])   Uncontrolled hypertension   Malignant hypertension   SECONDARY DIAGNOSIS:   Past Medical History:  Diagnosis Date  . Hypertension   . Polysubstance abuse ([place])      ADMITTING HISTORY  Gerald [name]  is a 38 y.o. male with a known history of hypertension and med noncompliance who presents with shortness of breath for the last 2 days.  He also has fever.  Patient states that breathing has progressively gotten worse.  He is also had productive cough.  In the emergency room patient was very short of breath therefore had to be placed on BiPAP.  Chest x-ray suggested bilateral pneumonia and he is got low-grade fever.  Patient is COVID is negative. He denies any nausea vomiting or diarrhea.  HOSPITAL COURSE:  Patient was admitted for respiratory distress was put on BiPAP initially.  COVID-19 test was negative.  Patient was given Lasix for diuresis.  He was put on IV Cardizem drip for rate control for atrial fibrillation.  Elevated troponin secondary to demand ischemia.  Patient's electrolytes were aggressively replaced.During hospitalization patient was worked up with CT  head, renal ultrasound.  Renal ultrasound did not show any stenosis.  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  Patient was aggressively diuresed with Lasix for heart failure exacerbation and switch to oral Lasix.  He was started on Aldactone, losartan, Coreg for better control of blood pressure.  He was weaned of BiPAP transition to oxygen by nasal cannula and then weaned off to room air.  Patient was anticoagulated with oral Xarelto heparin drip was stopped for atrial fibrillation.  He was worked up with transthoracic echo and transesophageal echo during the hospitalization.  Transesophageal echo showed no thrombi or masses.  Transthoracic echo showed EF of 30 to 35% with reduced systolic function.  CONSULTS OBTAINED:  Treatment Team:  Minna Merritts, MD  DRUG ALLERGIES:  No Known Allergies  DISCHARGE MEDICATIONS:   Allergies as of 04/09/2019   No Known Allergies     Medication List    STOP taking these medications   atorvastatin 40 MG tablet Commonly known as: LIPITOR   hydrALAZINE 100 MG tablet Commonly known as: APRESOLINE   isosorbide mononitrate 30 MG 24 hr tablet Commonly known as: IMDUR     TAKE these medications   amLODipine 10 MG tablet Commonly known as: NORVASC Take 1 tablet (10 mg total) by mouth daily. Start taking on: April 10, 2019   aspirin 81 MG EC tablet Take 1 tablet (81 mg total) by mouth daily. Start taking on: April 10, 2019   carvedilol 25 MG tablet Commonly known as: COREG Take 1 tablet (25 mg total) by mouth 2 (two) times daily with a meal.  cloNIDine 0.1 MG tablet Commonly known as: CATAPRES Take 1 tablet (0.1 mg total) by mouth 2 (two) times daily. What changed:   medication strength  how much to take   hydrochlorothiazide 25 MG tablet Commonly known as: HYDRODIURIL Take 1 tablet (25 mg total) by mouth daily. Start taking on: April 10, 2019   losartan 100 MG tablet Commonly known as:  COZAAR Take 1 tablet (100 mg total) by mouth daily. Start taking on: April 10, 2019   potassium chloride SA 20 MEQ tablet Commonly known as: K-DUR Take 1 tablet (20 mEq total) by mouth daily. Start taking on: April 10, 2019   rivaroxaban 20 MG Tabs tablet Commonly known as: XARELTO Take 1 tablet (20 mg total) by mouth daily with supper.   spironolactone 50 MG tablet Commonly known as: ALDACTONE Take 1 tablet (50 mg total) by mouth daily. Start taking on: April 10, 2019       Today  Patient seen today Tolerating diet well No headache No chest pain Hemodynamically stable VITAL SIGNS:  Blood pressure (!) 160/90, pulse 69, temperature 98.3 F (36.8 C), resp. rate 14, height 5\' 11"  (1.803 m), weight 120 kg, SpO2 99 %.  I/O:    Intake/Output Summary (Last 24 hours) at 04/09/2019 1215 Last data filed at 04/09/2019 0500 Gross per 24 hour  Intake 360 ml  Output 1000 ml  Net -640 ml    PHYSICAL EXAMINATION:  Physical Exam  GENERAL:  38 y.o.-year-old patient lying in the bed with no acute distress.  LUNGS: Normal breath sounds bilaterally, no wheezing, rales,rhonchi or crepitation. No use of accessory muscles of respiration.  CARDIOVASCULAR: S1, S2 normal. No murmurs, rubs, or gallops.  ABDOMEN: Soft, non-tender, non-distended. Bowel sounds present. No organomegaly or mass.  NEUROLOGIC: Moves all 4 extremities. PSYCHIATRIC: The patient is alert and oriented x 3.  SKIN: No obvious rash, lesion, or ulcer.   DATA REVIEW:   CBC Recent Labs  Lab 04/08/19 1442  WBC 6.2  HGB 11.3*  HCT 36.5*  PLT 379    Chemistries  Recent Labs  Lab 04/03/19 0414 04/04/19 0512  04/08/19 1442  NA 137 138   < > 138  K 3.0* 3.1*   < > 4.3  CL 104 97*   < > 101  CO2 25 26   < > 25  GLUCOSE 100* 90   < > 95  BUN 23* 25*   < > 19  CREATININE 1.41* 1.50*   < > 1.57*  CALCIUM 8.6* 8.6*   < > 9.2  MG 1.9 2.0  --   --   AST 25  --   --   --   ALT 46*  --   --   --   ALKPHOS 97   --   --   --   BILITOT 0.7  --   --   --    < > = values in this interval not displayed.    Cardiac Enzymes No results for input(s): TROPONINI in the last 168 hours.  Microbiology Results  Results for orders placed or performed during the hospital encounter of 04/01/19  SARS Coronavirus 2 [facility] order, Performed in [facility] hospital lab) Nasopharyngeal Nasopharyngeal Swab     Status: None   Collection Time: 04/01/19  6:02 AM   Specimen: Nasopharyngeal Swab  Result Value Ref Range Status   SARS Coronavirus 2 NEGATIVE NEGATIVE Final    Comment: (NOTE) If result is NEGATIVE SARS-CoV-2 target nucleic acids  are NOT DETECTED. The SARS-CoV-2 RNA is generally detectable in upper and lower  respiratory specimens during the acute phase of infection. The lowest  concentration of SARS-CoV-2 viral copies this assay can detect is 250  copies / mL. A negative result does not preclude SARS-CoV-2 infection  and should not be used as the sole basis for treatment or other  patient management decisions.  A negative result may occur with  improper specimen collection / handling, submission of specimen other  than nasopharyngeal swab, presence of viral mutation(s) within the  areas targeted by this assay, and inadequate number of viral copies  (<250 copies / mL). A negative result must be combined with clinical  observations, patient history, and epidemiological information. If result is POSITIVE SARS-CoV-2 target nucleic acids are DETECTED. The SARS-CoV-2 RNA is generally detectable in upper and lower  respiratory specimens dur ing the acute phase of infection.  Positive  results are indicative of active infection with SARS-CoV-2.  Clinical  correlation with patient history and other diagnostic information is  necessary to determine patient infection status.  Positive results do  not rule out bacterial infection or co-infection with other viruses. If result is PRESUMPTIVE POSTIVE SARS-CoV-2  nucleic acids MAY BE PRESENT.   A presumptive positive result was obtained on the submitted specimen  and confirmed on repeat testing.  While 2019 novel coronavirus  (SARS-CoV-2) nucleic acids may be present in the submitted sample  additional confirmatory testing may be necessary for epidemiological  and / or clinical management purposes  to differentiate between  SARS-CoV-2 and other Sarbecovirus currently known to infect humans.  If clinically indicated additional testing with an alternate test  methodology [phone]) is advised. The SARS-CoV-2 RNA is generally  detectable in upper and lower respiratory sp ecimens during the acute  phase of infection. The expected result is Negative. Fact Sheet for Patients:  StrictlyIdeas.no Fact Sheet for Healthcare Providers: BankingDealers.co.za This test is not yet approved or cleared by the [place] FDA and has been authorized for detection and/or diagnosis of SARS-CoV-2 by FDA under an Emergency Use Authorization (EUA).  This EUA will remain in effect (meaning this test can be used) for the duration of the COVID-19 declaration under Section 564(b)(1) of the Act, 21 U.S.C. section 360bbb-3(b)(1), unless the authorization is terminated or revoked sooner. Performed at [facility], [address] [place], [place] [address]   Blood Culture (routine x 2)     Status: None   Collection Time: 04/01/19  6:07 AM   Specimen: BLOOD  Result Value Ref Range Status   Specimen Description BLOOD LEFT ANTECUBITAL  Final   Special Requests   Final    BOTTLES DRAWN AEROBIC AND ANAEROBIC Blood Culture results may not be optimal due to an excessive volume of blood received in culture bottles   Culture   Final    NO GROWTH 5 DAYS Performed at [facility], [address] [place], [place] [address]    Report Status 04/06/2019 FINAL  Final  Blood Culture (routine x 2)     Status: None    Collection Time: 04/01/19  6:13 AM   Specimen: BLOOD  Result Value Ref Range Status   Specimen Description BLOOD BLOOD LEFT HAND  Final   Special Requests   Final    BOTTLES DRAWN AEROBIC AND ANAEROBIC Blood Culture adequate volume   Culture   Final    NO GROWTH 5 DAYS Performed at [facility], [place],  [place] [address]    Report Status 04/06/2019 FINAL  Final  SARS Coronavirus 2 [facility] order, Performed in [facility] hospital lab) Nasopharyngeal Nasopharyngeal Swab     Status: None   Collection Time: 04/01/19 10:13 AM   Specimen: Nasopharyngeal Swab  Result Value Ref Range Status   SARS Coronavirus 2 NEGATIVE NEGATIVE Final    Comment: (NOTE) If result is NEGATIVE SARS-CoV-2 target nucleic acids are NOT DETECTED. The SARS-CoV-2 RNA is generally detectable in upper and lower  respiratory specimens during the acute phase of infection. The lowest  concentration of SARS-CoV-2 viral copies this assay can detect is 250  copies / mL. A negative result does not preclude SARS-CoV-2 infection  and should not be used as the sole basis for treatment or other  patient management decisions.  A negative result may occur with  improper specimen collection / handling, submission of specimen other  than nasopharyngeal swab, presence of viral mutation(s) within the  areas targeted by this assay, and inadequate number of viral copies  (<250 copies / mL). A negative result must be combined with clinical  observations, patient history, and epidemiological information. If result is POSITIVE SARS-CoV-2 target nucleic acids are DETECTED. The SARS-CoV-2 RNA is generally detectable in upper and lower  respiratory specimens dur ing the acute phase of infection.  Positive  results are indicative of active infection with SARS-CoV-2.  Clinical  correlation with patient history and other diagnostic information is  necessary to determine patient infection status.  Positive results  do  not rule out bacterial infection or co-infection with other viruses. If result is PRESUMPTIVE POSTIVE SARS-CoV-2 nucleic acids MAY BE PRESENT.   A presumptive positive result was obtained on the submitted specimen  and confirmed on repeat testing.  While 2019 novel coronavirus  (SARS-CoV-2) nucleic acids may be present in the submitted sample  additional confirmatory testing may be necessary for epidemiological  and / or clinical management purposes  to differentiate between  SARS-CoV-2 and other Sarbecovirus currently known to infect humans.  If clinically indicated additional testing with an alternate test  methodology [phone]) is advised. The SARS-CoV-2 RNA is generally  detectable in upper and lower respiratory sp ecimens during the acute  phase of infection. The expected result is Negative. Fact Sheet for Patients:  StrictlyIdeas.no Fact Sheet for Healthcare Providers: BankingDealers.co.za This test is not yet approved or cleared by the [place] FDA and has been authorized for detection and/or diagnosis of SARS-CoV-2 by FDA under an Emergency Use Authorization (EUA).  This EUA will remain in effect (meaning this test can be used) for the duration of the COVID-19 declaration under Section 564(b)(1) of the Act, 21 U.S.C. section 360bbb-3(b)(1), unless the authorization is terminated or revoked sooner. Performed at [facility], [address] [place], [place] [address]   MRSA PCR Screening     Status: None   Collection Time: 04/02/19  3:14 PM   Specimen: Nasopharyngeal  Result Value Ref Range Status   MRSA by PCR NEGATIVE NEGATIVE Final    Comment:        The GeneXpert MRSA Assay (FDA approved for NASAL specimens only), is one component of a comprehensive MRSA colonization surveillance program. It is not intended to diagnose MRSA infection nor to guide or monitor treatment for MRSA infections. Performed  at [facility], [address], [place], [place] [address]     RADIOLOGY:  No results found.  Follow up with PCP in 1 week.  Management plans discussed  with the patient, family and they are in agreement.  CODE STATUS: Full code    Code Status Orders  (From admission, onward)         Start     Ordered   04/01/19 0950  Full code  Continuous     04/01/19 0949        Code Status History    Date Active Date Inactive Code Status Order ID Comments User Context   12/21/2015 1501 12/23/2015 1607 Full Code [id number]  Demetrios Loll, MD Inpatient   Advance Care Planning Activity      TOTAL TIME TAKING CARE OF THIS PATIENT ON DAY OF DISCHARGE: more than 35 minutes.   Saundra Shelling M.D on 04/09/2019 at 12:15 PM  Between 7am to 6pm - Pager - [phone]  After 6pm go to [email or web address] - password [email or web address] [place] [facility]  Office  [phone]  CC: Primary care physician; Patient, [name]  Note: This dictation was prepared with Dragon dictation along with smaller phrase technology. Any transcriptional errors that result from this process are unintentional.

## 2019-04-10 LAB — METANEPHRINES, PLASMA
Metanephrine, Free: 45.4 pg/mL (ref 0.0–88.0)
Normetanephrine, Free: 89.7 pg/mL (ref 0.0–110.1)

## 2019-04-12 NOTE — Progress Notes (Deleted)
   Patient ID: Gerald [name], male    DOB: Dec 24, 1980, 38 y.o.   MRN: [id number]  HPI  Gerald [name] is a 38 y/o male with a history of  Echo report from 04/04/2019 reviewed and showed an EF of 30-35% along with mild/moderate Gerald and no thrombi or masses.   Admitted 04/01/2019 due to acute respiratory failure. Cardiology consult obtained. COVID test negative. Initially placed on bipap and given IV lasix. Transitioned to oral diuretics. Needed IV cardizem short-term due to atrial fibrillation. Elevated tropinin thought to be due to demand ischemia. Discharged after 8 days.   He presents today for his initial visit with a chief complaint of  Review of Systems    Physical Exam    Assessment & Plan:   1: Chronic heart failure with reduced ejection fraction- - NYHA class - BNP 04/01/2019 was 1059.0 - ? Change to entresto/ add farxiga  2: HTN- - BP - goes to [facility] for primary care needs - BMP from 04/08/2019 reviewed and showed sodium 138, potassium 4.3, creatinine 1.57 and GFR >60  3: Tobacco use-  4: Atrial fibrillation- - cardioverted 04/04/2019

## 2019-04-13 ENCOUNTER — Ambulatory Visit: Payer: Self-pay | Admitting: Family

## 2019-04-13 ENCOUNTER — Telehealth: Payer: Self-pay | Admitting: Family

## 2019-04-13 NOTE — Telephone Encounter (Signed)
Patient did not show for his Heart Failure Clinic appointment on 04/13/2019. Will attempt to reschedule.

## 2019-05-05 NOTE — Progress Notes (Signed)
Patient ID: Gerald [name], male    DOB: 1981/05/11, 38 y.o.   MRN: [id number]  HPI  Gerald [name] is a 38 y/o male with a history of HTN, atrial fibrillation, current tobacco use and chronic heart failure.   Echo report from 04/04/2019 reviewed and showed an EF of 30-35% along with mild/moderate Gerald and no thrombi or masses.   Admitted 04/01/2019 due to acute respiratory failure. Cardiology consult obtained. COVID test negative. Initially placed on bipap and given IV lasix. Transitioned to oral diuretics. Needed IV cardizem short-term due to atrial fibrillation. Elevated tropinin thought to be due to demand ischemia. Discharged after 8 days.   He presents today for his initial visit with a chief complaint of minimal fatigue upon moderate exertion. He describes this as having been present for several months. He has associated light-headedness, difficulty sleeping and depression along with this. He denies any abdominal distention, palpitations, pedal edema, chest pain, shortness of breath, cough or wheezing. Overall he says that he feels "much better" since he left the hospital.   Past Medical History:  Diagnosis Date  . Hypertension   . Polysubstance abuse [facility])    Past Surgical History:  Procedure Laterality Date  . KNEE SURGERY Right   . TEE WITHOUT CARDIOVERSION N/A 04/04/2019   Procedure: TRANSESOPHAGEAL ECHOCARDIOGRAM (TEE);  Surgeon: [name], MD;  Location: [facility] ORS;  Service: Cardiovascular;  Laterality: N/A;   Family History  Problem Relation Age of Onset  . Hypertension Father    Social History   Tobacco Use  . Smoking status: Current Every Day Smoker    Packs/day: 1.00    Types: Cigarettes  . Smokeless tobacco: Never Used  Substance Use Topics  . Alcohol use: Yes    Comment: occas.    No Known Allergies Prior to Admission medications   Medication Sig Start Date End Date Taking? Authorizing Provider  amLODipine (NORVASC) 10 MG tablet Take 1 tablet (10 mg total)  by mouth daily. 04/10/19 05/10/19 Yes Pyreddy, Reatha Harps, MD  aspirin EC 81 MG EC tablet Take 1 tablet (81 mg total) by mouth daily. 04/10/19 05/10/19 Yes Pyreddy, Reatha Harps, MD  atorvastatin (LIPITOR) 40 MG tablet Take 40 mg by mouth daily.   Yes [provider]  carvedilol (COREG) 25 MG tablet Take 1 tablet (25 mg total) by mouth 2 (two) times daily with a meal. 04/09/19 05/09/19 Yes Pyreddy, Reatha Harps, MD  cloNIDine (CATAPRES) 0.1 MG tablet Take 1 tablet (0.1 mg total) by mouth 2 (two) times daily. 04/09/19  Yes Pyreddy, Reatha Harps, MD  hydrALAZINE (APRESOLINE) 100 MG tablet Take 100 mg by mouth 2 (two) times daily.   Yes [provider]  hydrochlorothiazide (HYDRODIURIL) 25 MG tablet Take 1 tablet (25 mg total) by mouth daily. 04/10/19 05/10/19 Yes Pyreddy, Reatha Harps, MD  isosorbide mononitrate (IMDUR) 30 MG 24 hr tablet Take 30 mg by mouth daily.   Yes [provider]  losartan (COZAAR) 100 MG tablet Take 1 tablet (100 mg total) by mouth daily. 04/10/19 05/10/19 Yes Pyreddy, Reatha Harps, MD  potassium chloride SA (K-DUR) 20 MEQ tablet Take 1 tablet (20 mEq total) by mouth daily. 04/10/19 05/10/19 Yes Pyreddy, Reatha Harps, MD  rivaroxaban (XARELTO) 20 MG TABS tablet Take 1 tablet (20 mg total) by mouth daily with supper. 04/09/19 05/09/19 Yes Pyreddy, Reatha Harps, MD  spironolactone (ALDACTONE) 50 MG tablet Take 1 tablet (50 mg total) by mouth daily. 04/10/19 05/10/19 Yes PyreddyReatha Harps, MD    Review of Systems  Constitutional: Positive  for fatigue (minimal). Negative for appetite change.  HENT: Negative for congestion, postnasal drip and sore throat.   Eyes: Negative.   Respiratory: Negative for cough, chest tightness and shortness of breath.   Cardiovascular: Negative for chest pain, palpitations and leg swelling.  Gastrointestinal: Negative for abdominal distention and abdominal pain.  Endocrine: Negative.   Genitourinary: Negative.   Musculoskeletal: Negative for back pain and neck pain.  Skin: Negative.    Allergic/Immunologic: Negative.   Neurological: Positive for light-headedness. Negative for dizziness.  Hematological: Negative for adenopathy. Does not bruise/bleed easily.  Psychiatric/Behavioral: Positive for dysphoric mood (brother in law recently died) and sleep disturbance. The patient is not nervous/anxious.     Vitals:   05/06/19 1221  BP: (!) 147/88  Pulse: 74  Resp: 18  SpO2: 100%  Weight: 255 lb 2 oz (115.7 kg)  Height: 5\' 11"  (1.803 m)   Wt Readings from Last 3 Encounters:  05/06/19 255 lb 2 oz (115.7 kg)  04/06/19 264 lb 8.8 oz (120 kg)  06/27/18 265 lb (120.2 kg)   Lab Results  Component Value Date   CREATININE 1.57 (H) 04/08/2019   CREATININE 1.29 (H) 04/06/2019   CREATININE 1.36 (H) 04/05/2019    Physical Exam Vitals signs and nursing note reviewed.  Constitutional:      Appearance: Normal appearance.  HENT:     Head: Normocephalic and atraumatic.  Neck:     Musculoskeletal: Normal range of motion and neck supple.  Cardiovascular:     Rate and Rhythm: Normal rate and regular rhythm.  Pulmonary:     Effort: Pulmonary effort is normal. No respiratory distress.     Breath sounds: No wheezing or rales.  Abdominal:     General: There is no distension.     Palpations: Abdomen is soft.  Musculoskeletal:        General: No tenderness.     Right lower leg: No edema.     Left lower leg: No edema.  Skin:    General: Skin is warm and dry.  Neurological:     General: No focal deficit present.     Mental Status: He is alert and oriented to person, place, and time.  Psychiatric:        Mood and Affect: Mood normal.        Behavior: Behavior normal.    Assessment & Plan:   1: Chronic heart failure with reduced ejection fraction- - NYHA class [name] - euvolemic today - not weighing daily as he doesn't have any scales; set of scales given and he was instructed to weigh every morning after using the bathroom and call for an overnight weight gain of >2 pounds  or a weekly weight gain of >5 pounds - not adding salt to his food and has been trying to eat low sodium foods - BNP 04/01/2019 was 1059.0 - finish out losartan and then begin entresto 49/51mg  BID; will check BMP at his next visit; 30 day entresto voucher given to patient - plan to titrate up entresto at next visit - consider adding farxiga in the future & stopping HCTZ  2: HTN- - BP mildly elevated today - finish out amlodipine and then stop it since adding entresto per above - [facility] contact information & paperwork given to patient - may need to change hydralazine/ isosorbide to bidil - BMP from 04/08/2019 reviewed and showed sodium 138, potassium 4.3, creatinine 1.57 and GFR >60  3: Tobacco use- - continues to smoke daily - complete  cessation discussed for 3 minutes with him  4: Atrial fibrillation- - cardioverted 04/04/2019 - f/u cardiology appointment scheduled for 05/19/2019  Medication bottles were reviewed. Information given to him regarding [facility].  Return in 1 month or sooner for any questions/problems before then.

## 2019-05-06 ENCOUNTER — Encounter: Payer: Self-pay | Admitting: Family

## 2019-05-06 ENCOUNTER — Ambulatory Visit: Payer: Self-pay | Attending: Family | Admitting: Family

## 2019-05-06 ENCOUNTER — Other Ambulatory Visit: Payer: Self-pay

## 2019-05-06 VITALS — BP 147/88 | HR 74 | Resp 18 | Ht 71.0 in | Wt 255.1 lb

## 2019-05-06 DIAGNOSIS — I10 Essential (primary) hypertension: Secondary | ICD-10-CM

## 2019-05-06 DIAGNOSIS — Z79899 Other long term (current) drug therapy: Secondary | ICD-10-CM | POA: Insufficient documentation

## 2019-05-06 DIAGNOSIS — I480 Paroxysmal atrial fibrillation: Secondary | ICD-10-CM

## 2019-05-06 DIAGNOSIS — F17210 Nicotine dependence, cigarettes, uncomplicated: Secondary | ICD-10-CM | POA: Insufficient documentation

## 2019-05-06 DIAGNOSIS — Z7901 Long term (current) use of anticoagulants: Secondary | ICD-10-CM | POA: Insufficient documentation

## 2019-05-06 DIAGNOSIS — I110 Hypertensive heart disease with heart failure: Secondary | ICD-10-CM | POA: Insufficient documentation

## 2019-05-06 DIAGNOSIS — I5022 Chronic systolic (congestive) heart failure: Secondary | ICD-10-CM | POA: Insufficient documentation

## 2019-05-06 DIAGNOSIS — I4891 Unspecified atrial fibrillation: Secondary | ICD-10-CM | POA: Insufficient documentation

## 2019-05-06 DIAGNOSIS — Z720 Tobacco use: Secondary | ICD-10-CM

## 2019-05-06 DIAGNOSIS — Z8249 Family history of ischemic heart disease and other diseases of the circulatory system: Secondary | ICD-10-CM | POA: Insufficient documentation

## 2019-05-06 DIAGNOSIS — Z7982 Long term (current) use of aspirin: Secondary | ICD-10-CM | POA: Insufficient documentation

## 2019-05-06 MED ORDER — POTASSIUM CHLORIDE CRYS ER 20 MEQ PO TBCR: 20.0000 meq | EXTENDED_RELEASE_TABLET | Freq: Every day | ORAL | 5 refills | Status: DC

## 2019-05-06 MED ORDER — RIVAROXABAN 20 MG PO TABS: 20.0000 mg | ORAL_TABLET | Freq: Every day | ORAL | 5 refills | Status: DC

## 2019-05-06 MED ORDER — SPIRONOLACTONE 50 MG PO TABS: 50.0000 mg | ORAL_TABLET | Freq: Every day | ORAL | 5 refills | Status: DC

## 2019-05-06 MED ORDER — SACUBITRIL-VALSARTAN 49-51 MG PO TABS: 1.0000 | ORAL_TABLET | Freq: Two times a day (BID) | ORAL | 5 refills | Status: DC

## 2019-05-06 MED ORDER — ATORVASTATIN CALCIUM 40 MG PO TABS: 40.0000 mg | ORAL_TABLET | Freq: Every day | ORAL | 5 refills | Status: DC

## 2019-05-06 MED ORDER — HYDROCHLOROTHIAZIDE 25 MG PO TABS: 25.0000 mg | ORAL_TABLET | Freq: Every day | ORAL | 5 refills | Status: DC

## 2019-05-06 NOTE — Patient Instructions (Addendum)
Continue weighing daily and call for an overnight weight gain of > 2 pounds or a weekly weight gain of >5 pounds.  Finish amlodipine and then stop taking it.   Finish your losartan and then begin entresto 1 tablet twice daily.     [facility]   [phone]  [place] [address]  Hours: Tuesday: 4:15 pm - 7:30 pm  Wednesday: 9 am - 1 pm  Thursday: 1 pm - 7:30 pm  Friday - Monday: CLOSED

## 2019-05-19 ENCOUNTER — Ambulatory Visit: Payer: Self-pay | Admitting: Physician Assistant

## 2019-05-30 ENCOUNTER — Ambulatory Visit: Payer: Self-pay | Admitting: Family

## 2019-05-30 ENCOUNTER — Telehealth: Payer: Self-pay | Admitting: Family

## 2019-05-30 NOTE — Telephone Encounter (Signed)
Patient did not show for his Heart Failure Clinic appointment on 05/30/2019. Will attempt to reschedule.

## 2020-05-22 ENCOUNTER — Other Ambulatory Visit: Payer: Self-pay

## 2020-05-22 ENCOUNTER — Ambulatory Visit (LOCAL_COMMUNITY_HEALTH_CENTER): Payer: Self-pay

## 2020-05-22 DIAGNOSIS — Z111 Encounter for screening for respiratory tuberculosis: Secondary | ICD-10-CM

## 2020-05-25 ENCOUNTER — Ambulatory Visit (LOCAL_COMMUNITY_HEALTH_CENTER): Payer: Self-pay

## 2020-05-25 ENCOUNTER — Other Ambulatory Visit: Payer: Self-pay

## 2020-05-25 DIAGNOSIS — Z111 Encounter for screening for respiratory tuberculosis: Secondary | ICD-10-CM

## 2020-05-25 LAB — TB SKIN TEST
Induration: 0 mm
TB Skin Test: NEGATIVE

## 2020-07-05 ENCOUNTER — Other Ambulatory Visit: Payer: Self-pay

## 2020-07-05 ENCOUNTER — Emergency Department (HOSPITAL_COMMUNITY): Payer: Self-pay

## 2020-07-05 ENCOUNTER — Inpatient Hospital Stay (HOSPITAL_COMMUNITY)
Admission: EM | Admit: 2020-07-05 | Discharge: 2020-07-07 | DRG: 280 | Disposition: A | Discharge status: Home / Self Care | Payer: Self-pay | Attending: Internal Medicine | Admitting: Internal Medicine

## 2020-07-05 ENCOUNTER — Encounter (HOSPITAL_COMMUNITY): Payer: Self-pay | Admitting: Family Medicine

## 2020-07-05 ENCOUNTER — Inpatient Hospital Stay (HOSPITAL_COMMUNITY): Payer: Self-pay

## 2020-07-05 DIAGNOSIS — I130 Hypertensive heart and chronic kidney disease with heart failure and stage 1 through stage 4 chronic kidney disease, or unspecified chronic kidney disease: Principal | ICD-10-CM | POA: Diagnosis present

## 2020-07-05 DIAGNOSIS — E876 Hypokalemia: Secondary | ICD-10-CM | POA: Diagnosis present

## 2020-07-05 DIAGNOSIS — I428 Other cardiomyopathies: Secondary | ICD-10-CM | POA: Diagnosis present

## 2020-07-05 DIAGNOSIS — I480 Paroxysmal atrial fibrillation: Secondary | ICD-10-CM | POA: Diagnosis present

## 2020-07-05 DIAGNOSIS — I160 Hypertensive urgency: Secondary | ICD-10-CM | POA: Diagnosis present

## 2020-07-05 DIAGNOSIS — I5021 Acute systolic (congestive) heart failure: Secondary | ICD-10-CM

## 2020-07-05 DIAGNOSIS — Z8249 Family history of ischemic heart disease and other diseases of the circulatory system: Secondary | ICD-10-CM

## 2020-07-05 DIAGNOSIS — I21A1 Myocardial infarction type 2: Secondary | ICD-10-CM | POA: Diagnosis present

## 2020-07-05 DIAGNOSIS — I5023 Acute on chronic systolic (congestive) heart failure: Secondary | ICD-10-CM

## 2020-07-05 DIAGNOSIS — F17210 Nicotine dependence, cigarettes, uncomplicated: Secondary | ICD-10-CM | POA: Diagnosis present

## 2020-07-05 DIAGNOSIS — Z20822 Contact with and (suspected) exposure to covid-19: Secondary | ICD-10-CM | POA: Diagnosis present

## 2020-07-05 DIAGNOSIS — Z79899 Other long term (current) drug therapy: Secondary | ICD-10-CM

## 2020-07-05 DIAGNOSIS — D509 Iron deficiency anemia, unspecified: Secondary | ICD-10-CM | POA: Diagnosis present

## 2020-07-05 DIAGNOSIS — I5043 Acute on chronic combined systolic (congestive) and diastolic (congestive) heart failure: Secondary | ICD-10-CM | POA: Diagnosis present

## 2020-07-05 DIAGNOSIS — R778 Other specified abnormalities of plasma proteins: Secondary | ICD-10-CM | POA: Diagnosis present

## 2020-07-05 DIAGNOSIS — N289 Disorder of kidney and ureter, unspecified: Secondary | ICD-10-CM

## 2020-07-05 DIAGNOSIS — I4819 Other persistent atrial fibrillation: Secondary | ICD-10-CM | POA: Diagnosis present

## 2020-07-05 DIAGNOSIS — I161 Hypertensive emergency: Secondary | ICD-10-CM | POA: Diagnosis present

## 2020-07-05 DIAGNOSIS — I252 Old myocardial infarction: Secondary | ICD-10-CM

## 2020-07-05 DIAGNOSIS — D631 Anemia in chronic kidney disease: Secondary | ICD-10-CM | POA: Diagnosis present

## 2020-07-05 DIAGNOSIS — Z91120 Patient's intentional underdosing of medication regimen due to financial hardship: Secondary | ICD-10-CM

## 2020-07-05 DIAGNOSIS — N1831 Chronic kidney disease, stage 3a: Secondary | ICD-10-CM | POA: Diagnosis present

## 2020-07-05 DIAGNOSIS — Z7901 Long term (current) use of anticoagulants: Secondary | ICD-10-CM

## 2020-07-05 DIAGNOSIS — I10 Essential (primary) hypertension: Secondary | ICD-10-CM

## 2020-07-05 HISTORY — DX: Acute on chronic systolic (congestive) heart failure: I50.23

## 2020-07-05 LAB — COMPREHENSIVE METABOLIC PANEL
ALT: 34 U/L (ref 0–44)
AST: 24 U/L (ref 15–41)
Albumin: 2.6 g/dL — ABNORMAL LOW (ref 3.5–5.0)
Alkaline Phosphatase: 77 U/L (ref 38–126)
Anion gap: 8 (ref 5–15)
BUN: 22 mg/dL — ABNORMAL HIGH (ref 6–20)
CO2: 25 mmol/L (ref 22–32)
Calcium: 8.2 mg/dL — ABNORMAL LOW (ref 8.9–10.3)
Chloride: 107 mmol/L (ref 98–111)
Creatinine, Ser: 1.61 mg/dL — ABNORMAL HIGH (ref 0.61–1.24)
GFR, Estimated: 55 mL/min — ABNORMAL LOW (ref >60)
Glucose, Bld: 112 mg/dL — ABNORMAL HIGH (ref 70–99)
Potassium: 2.7 mmol/L — CL (ref 3.5–5.1)
Sodium: 140 mmol/L (ref 135–145)
Total Bilirubin: 0.8 mg/dL (ref 0.3–1.2)
Total Protein: 5.7 g/dL — ABNORMAL LOW (ref 6.5–8.1)

## 2020-07-05 LAB — BASIC METABOLIC PANEL
Anion gap: 12 (ref 5–15)
BUN: 19 mg/dL (ref 6–20)
CO2: 25 mmol/L (ref 22–32)
Calcium: 8.7 mg/dL — ABNORMAL LOW (ref 8.9–10.3)
Chloride: 102 mmol/L (ref 98–111)
Creatinine, Ser: 1.6 mg/dL — ABNORMAL HIGH (ref 0.61–1.24)
GFR, Estimated: 56 mL/min — ABNORMAL LOW (ref >60)
Glucose, Bld: 107 mg/dL — ABNORMAL HIGH (ref 70–99)
Potassium: 2.8 mmol/L — ABNORMAL LOW (ref 3.5–5.1)
Sodium: 139 mmol/L (ref 135–145)

## 2020-07-05 LAB — CBC WITH DIFFERENTIAL/PLATELET
Abs Immature Granulocytes: 0.03 10*3/uL (ref 0.00–0.07)
Basophils Absolute: 0 10*3/uL (ref 0.0–0.1)
Basophils Relative: 1 %
Eosinophils Absolute: 0.3 10*3/uL (ref 0.0–0.5)
Eosinophils Relative: 3 %
HCT: 31.6 % — ABNORMAL LOW (ref 39.0–52.0)
Hemoglobin: 9.5 g/dL — ABNORMAL LOW (ref 13.0–17.0)
Immature Granulocytes: 0 %
Lymphocytes Relative: 15 %
Lymphs Abs: 1.2 10*3/uL (ref 0.7–4.0)
MCH: 20.7 pg — ABNORMAL LOW (ref 26.0–34.0)
MCHC: 30.1 g/dL (ref 30.0–36.0)
MCV: 68.7 fL — ABNORMAL LOW (ref 80.0–100.0)
Monocytes Absolute: 0.4 10*3/uL (ref 0.1–1.0)
Monocytes Relative: 5 %
Neutro Abs: 6 10*3/uL (ref 1.7–7.7)
Neutrophils Relative %: 76 %
Platelets: 251 10*3/uL (ref 150–400)
RBC: 4.6 MIL/uL (ref 4.22–5.81)
RDW: 17 % — ABNORMAL HIGH (ref 11.5–15.5)
WBC: 7.9 10*3/uL (ref 4.0–10.5)
nRBC: 0 % (ref 0.0–0.2)

## 2020-07-05 LAB — TROPONIN I (HIGH SENSITIVITY)
Troponin I (High Sensitivity): 693 ng/L (ref <18)
Troponin I (High Sensitivity): 622 ng/L (ref <18)

## 2020-07-05 LAB — RESPIRATORY PANEL BY RT PCR (FLU A&B, COVID)
Influenza A by PCR: NEGATIVE
Influenza B by PCR: NEGATIVE
SARS Coronavirus 2 by RT PCR: NEGATIVE

## 2020-07-05 LAB — ECHOCARDIOGRAM COMPLETE
Area-P 1/2: 5.97 cm2
Height: 72 in
S' Lateral: 5.3 cm
Weight: 3760 oz

## 2020-07-05 LAB — BRAIN NATRIURETIC PEPTIDE: B Natriuretic Peptide: 1066.8 pg/mL — ABNORMAL HIGH (ref 0.0–100.0)

## 2020-07-05 MED ORDER — LIVING BETTER WITH HEART FAILURE BOOK: Freq: Once | Status: AC

## 2020-07-05 MED ORDER — ACETAMINOPHEN 325 MG PO TABS
650.0000 mg | ORAL_TABLET | ORAL | Status: DC | PRN
  Administered 2020-07-05 – 2020-07-07 (×6): 650 mg via ORAL
  Filled 2020-07-05 (×7): qty 2

## 2020-07-05 MED ORDER — NITROGLYCERIN 2 % TD OINT
1.0000 [in_us] | TOPICAL_OINTMENT | Freq: Once | TRANSDERMAL | Status: AC
  Administered 2020-07-05: 1 [in_us] via TOPICAL
  Filled 2020-07-05: qty 1

## 2020-07-05 MED ORDER — HYDROCHLOROTHIAZIDE 25 MG PO TABS
25.0000 mg | ORAL_TABLET | Freq: Every day | ORAL | Status: DC
  Administered 2020-07-05 – 2020-07-06 (×2): 25 mg via ORAL
  Filled 2020-07-05 (×2): qty 1

## 2020-07-05 MED ORDER — POTASSIUM CHLORIDE CRYS ER 20 MEQ PO TBCR
40.0000 meq | EXTENDED_RELEASE_TABLET | Freq: Once | ORAL | Status: AC
  Administered 2020-07-05: 40 meq via ORAL
  Filled 2020-07-05: qty 2

## 2020-07-05 MED ORDER — ONDANSETRON HCL 4 MG/2ML IJ SOLN: 4.0000 mg | Freq: Four times a day (QID) | INTRAMUSCULAR | Status: DC | PRN

## 2020-07-05 MED ORDER — HYDROCODONE-ACETAMINOPHEN 5-325 MG PO TABS
1.0000 | ORAL_TABLET | Freq: Once | ORAL | Status: AC
  Administered 2020-07-05: 1 via ORAL
  Filled 2020-07-05: qty 1

## 2020-07-05 MED ORDER — SODIUM CHLORIDE 0.9% FLUSH
3.0000 mL | Freq: Two times a day (BID) | INTRAVENOUS | Status: DC
  Administered 2020-07-05 – 2020-07-07 (×5): 3 mL via INTRAVENOUS

## 2020-07-05 MED ORDER — RIVAROXABAN 20 MG PO TABS
20.0000 mg | ORAL_TABLET | Freq: Every day | ORAL | Status: DC
  Administered 2020-07-05 – 2020-07-06 (×2): 20 mg via ORAL
  Filled 2020-07-05 (×2): qty 1

## 2020-07-05 MED ORDER — CLONIDINE HCL 0.2 MG PO TABS
0.2000 mg | ORAL_TABLET | Freq: Once | ORAL | Status: AC
  Administered 2020-07-05: 0.2 mg via ORAL
  Filled 2020-07-05: qty 1

## 2020-07-05 MED ORDER — MAGNESIUM SULFATE IN D5W 1-5 GM/100ML-% IV SOLN
1.0000 g | Freq: Once | INTRAVENOUS | Status: AC
  Administered 2020-07-05: 1 g via INTRAVENOUS
  Filled 2020-07-05: qty 100

## 2020-07-05 MED ORDER — POTASSIUM CHLORIDE 10 MEQ/100ML IV SOLN
10.0000 meq | INTRAVENOUS | Status: AC
  Administered 2020-07-05 – 2020-07-06 (×4): 10 meq via INTRAVENOUS
  Filled 2020-07-05 (×3): qty 100

## 2020-07-05 MED ORDER — FENTANYL CITRATE (PF) 100 MCG/2ML IJ SOLN
100.0000 ug | Freq: Once | INTRAMUSCULAR | Status: AC
  Administered 2020-07-05: 100 ug via INTRAVENOUS
  Filled 2020-07-05 (×2): qty 2

## 2020-07-05 MED ORDER — HYDRALAZINE HCL 25 MG PO TABS
100.0000 mg | ORAL_TABLET | Freq: Two times a day (BID) | ORAL | Status: DC
  Administered 2020-07-05 – 2020-07-06 (×3): 100 mg via ORAL
  Filled 2020-07-05 (×3): qty 4

## 2020-07-05 MED ORDER — FUROSEMIDE 10 MG/ML IJ SOLN
40.0000 mg | Freq: Two times a day (BID) | INTRAMUSCULAR | Status: DC
  Administered 2020-07-05 – 2020-07-07 (×4): 40 mg via INTRAVENOUS
  Filled 2020-07-05 (×5): qty 4

## 2020-07-05 MED ORDER — SODIUM CHLORIDE 0.9% FLUSH: 3.0000 mL | INTRAVENOUS | Status: DC | PRN

## 2020-07-05 MED ORDER — NITROGLYCERIN IN D5W 200-5 MCG/ML-% IV SOLN
0.0000 ug/min | INTRAVENOUS | Status: DC
  Administered 2020-07-05: 5 ug/min via INTRAVENOUS
  Administered 2020-07-05: 130 ug/min via INTRAVENOUS
  Administered 2020-07-05: 100 ug/min via INTRAVENOUS
  Administered 2020-07-05: 145 ug/min via INTRAVENOUS
  Filled 2020-07-05 (×3): qty 250

## 2020-07-05 MED ORDER — POTASSIUM CHLORIDE 10 MEQ/100ML IV SOLN
10.0000 meq | Freq: Once | INTRAVENOUS | Status: AC
  Administered 2020-07-05: 10 meq via INTRAVENOUS
  Filled 2020-07-05: qty 100

## 2020-07-05 MED ORDER — AMLODIPINE BESYLATE 5 MG PO TABS
5.0000 mg | ORAL_TABLET | Freq: Every day | ORAL | Status: DC
  Administered 2020-07-05: 5 mg via ORAL
  Filled 2020-07-05: qty 1

## 2020-07-05 MED ORDER — SODIUM CHLORIDE 0.9 % IV SOLN
250.0000 mL | INTRAVENOUS | Status: DC | PRN
  Administered 2020-07-05: 250 mL via INTRAVENOUS

## 2020-07-05 MED ORDER — FENTANYL CITRATE (PF) 100 MCG/2ML IJ SOLN
50.0000 ug | Freq: Once | INTRAMUSCULAR | Status: AC
  Administered 2020-07-05: 50 ug via INTRAVENOUS
  Filled 2020-07-05: qty 2

## 2020-07-05 MED ORDER — ENOXAPARIN SODIUM 40 MG/0.4ML ~~LOC~~ SOLN: 40.0000 mg | Freq: Every day | SUBCUTANEOUS | Status: DC

## 2020-07-05 MED ORDER — CARVEDILOL 25 MG PO TABS
25.0000 mg | ORAL_TABLET | Freq: Two times a day (BID) | ORAL | Status: DC
  Administered 2020-07-05 – 2020-07-07 (×4): 25 mg via ORAL
  Filled 2020-07-05 (×4): qty 1
  Filled 2020-07-05: qty 8
  Filled 2020-07-05: qty 1

## 2020-07-05 MED ORDER — HYDRALAZINE HCL 25 MG PO TABS
100.0000 mg | ORAL_TABLET | Freq: Two times a day (BID) | ORAL | Status: DC
  Administered 2020-07-05: 100 mg via ORAL
  Filled 2020-07-05: qty 4

## 2020-07-05 MED ORDER — FUROSEMIDE 10 MG/ML IJ SOLN
40.0000 mg | Freq: Once | INTRAMUSCULAR | Status: AC
  Administered 2020-07-05: 40 mg via INTRAVENOUS
  Filled 2020-07-05: qty 4

## 2020-07-05 MED ORDER — IBUPROFEN 600 MG PO TABS
800.0000 mg | ORAL_TABLET | Freq: Three times a day (TID) | ORAL | Status: DC | PRN
  Administered 2020-07-05 – 2020-07-07 (×5): 800 mg via ORAL
  Filled 2020-07-05 (×6): qty 1

## 2020-07-05 NOTE — Progress Notes (Signed)
  Echocardiogram 2D Echocardiogram has been performed.  Randa Lynn Susan Bleich 07/05/2020, 9:34 AM

## 2020-07-05 NOTE — ED Provider Notes (Signed)
[facility] EMERGENCY DEPARTMENT Provider Note   CSN: [id number] Arrival date & time: 07/05/20  0122     History Chief Complaint  Patient presents with  . Shortness of Breath    Gerald [name] is a 39 y.o. male.  The history is provided by the patient.  Shortness of Breath Severity:  Moderate Onset quality:  Gradual Duration:  1 week Timing:  Intermittent Progression:  Worsening Chronicity:  New Relieved by:  Nothing Worsened by:  Activity Associated symptoms: chest pain, cough, hemoptysis and sputum production   Associated symptoms: no fever and no vomiting   Patient with history of CHF, hypertension presents with shortness of breath.  Patient ports for the past week he has had increasing shortness of breath and cough.  Reports some blood mixed into the sputum.  He also had right-sided chest pain.  No vomiting.  No fevers. Patient reports noncompliance with his medication regimen due to cost. He recently received his first Covid vaccine     Past Medical History:  Diagnosis Date  . Arrhythmia    atrial fibrillation  . CHF (congestive heart failure) ([place])   . Hypertension   . Polysubstance abuse [facility])     Patient Active Problem List   Diagnosis Date Noted  . Malignant hypertension   . Acute on chronic combined systolic and diastolic CHF (congestive heart failure) ([place])   . Atrial flutter ([place])   . Uncontrolled hypertension   . Persistent atrial fibrillation ([place])   . Acute systolic heart failure ([place])   . Acute respiratory failure ([place]) 04/01/2019  . NSTEMI (non-ST elevated myocardial infarction) ([place]) 12/21/2015  . Community acquired pneumonia 12/21/2015    Past Surgical History:  Procedure Laterality Date  . KNEE SURGERY Right   . TEE WITHOUT CARDIOVERSION N/A 04/04/2019   Procedure: TRANSESOPHAGEAL ECHOCARDIOGRAM (TEE);  Surgeon: [name], MD;  Location: [facility] ORS;  Service: Cardiovascular;  Laterality: N/A;       Family  History  Problem Relation Age of Onset  . Hypertension Father     Social History   Tobacco Use  . Smoking status: Current Every Day Smoker    Packs/day: 1.00    Types: Cigarettes  . Smokeless tobacco: Never Used  Substance Use Topics  . Alcohol use: Yes    Comment: occas.   . Drug use: Yes    Types: Cocaine    Home Medications Prior to Admission medications   Medication Sig Start Date End Date Taking? Authorizing Provider  atorvastatin (LIPITOR) 40 MG tablet Take 1 tablet (40 mg total) by mouth daily. 05/06/19   Alisa Graff, FNP  carvedilol (COREG) 25 MG tablet Take 1 tablet (25 mg total) by mouth 2 (two) times daily with a meal. 04/09/19 05/09/19  Pyreddy, Reatha Harps, MD  cloNIDine (CATAPRES) 0.1 MG tablet Take 1 tablet (0.1 mg total) by mouth 2 (two) times daily. 04/09/19   Saundra Shelling, MD  hydrALAZINE (APRESOLINE) 100 MG tablet Take 100 mg by mouth 2 (two) times daily.    [provider]  hydrochlorothiazide (HYDRODIURIL) 25 MG tablet Take 1 tablet (25 mg total) by mouth daily. 05/06/19 06/05/19  Alisa Graff, FNP  isosorbide mononitrate (IMDUR) 30 MG 24 hr tablet Take 30 mg by mouth daily.    [provider]  potassium chloride SA (K-DUR) 20 MEQ tablet Take 1 tablet (20 mEq total) by mouth daily. 05/06/19 06/05/19  Alisa Graff, FNP  rivaroxaban (XARELTO) 20 MG TABS tablet Take  1 tablet (20 mg total) by mouth daily with supper. 05/06/19 06/05/19  Alisa Graff, FNP  sacubitril-valsartan (ENTRESTO) 49-51 MG Take 1 tablet by mouth 2 (two) times daily. 05/06/19   Alisa Graff, FNP  spironolactone (ALDACTONE) 50 MG tablet Take 1 tablet (50 mg total) by mouth daily. 05/06/19 06/05/19  Alisa Graff, FNP    Allergies    Patient has no known allergies.  Review of Systems   Review of Systems  Constitutional: Negative for fever.  Respiratory: Positive for cough, hemoptysis, sputum production and shortness of breath.   Cardiovascular: Positive for chest  pain. Negative for leg swelling.  Gastrointestinal: Negative for vomiting.  All other systems reviewed and are negative.   Physical Exam Updated Vital Signs BP (!) 196/149   Pulse (!) 108   Temp 98.6 F (37 C) (Oral)   Resp 20   Ht 1.829 m (6')   Wt 106.6 kg   SpO2 93%   BMI 31.87 kg/m   Physical Exam CONSTITUTIONAL: Well developed/well nourished HEAD: Normocephalic/atraumatic EYES: EOMI/PERRL ENMT: Mucous membranes moist NECK: supple no meningeal signs SPINE/BACK:entire spine nontender CV: S1/S2 noted LUNGS: Crackles bilaterally, tachypnea noted ABDOMEN: soft, nontender, no rebound or guarding, bowel sounds noted throughout abdomen GU:no cva tenderness NEURO: Pt is awake/alert/appropriate, moves all extremitiesx4.  No facial droop.   EXTREMITIES: pulses normal/equal, full ROM, no significant lower extremity edema SKIN: warm, color normal PSYCH: no abnormalities of mood noted, alert and oriented to situation  ED Results / Procedures / Treatments   Labs (all labs ordered are listed, but only abnormal results are displayed) Labs Reviewed  CBC WITH DIFFERENTIAL/PLATELET - Abnormal; Notable for the following components:      Result Value   Hemoglobin 9.5 (*)    HCT 31.6 (*)    MCV 68.7 (*)    MCH 20.7 (*)    RDW 17.0 (*)    All other components within normal limits  COMPREHENSIVE METABOLIC PANEL - Abnormal; Notable for the following components:   Potassium 2.7 (*)    Glucose, Bld 112 (*)    BUN 22 (*)    Creatinine, Ser 1.61 (*)    Calcium 8.2 (*)    Total Protein 5.7 (*)    Albumin 2.6 (*)    GFR, Estimated 55 (*)    All other components within normal limits  BRAIN NATRIURETIC PEPTIDE - Abnormal; Notable for the following components:   B Natriuretic Peptide 1,066.8 (*)    All other components within normal limits  TROPONIN I (HIGH SENSITIVITY) - Abnormal; Notable for the following components:   Troponin I (High Sensitivity) 693 (*)    All other components  within normal limits  TROPONIN I (HIGH SENSITIVITY) - Abnormal; Notable for the following components:   Troponin I (High Sensitivity) 622 (*)    All other components within normal limits  RESPIRATORY PANEL BY RT PCR (FLU A&B, COVID)  BASIC METABOLIC PANEL    EKG EKG Interpretation  Date/Time:  Thursday July 05 2020 01:25:38 EST Ventricular Rate:  103 PR Interval:    QRS Duration: 110 QT Interval:  375 QTC Calculation: 491 R Axis:   -47 Text Interpretation: Sinus tachycardia Atrial premature complexes Borderline prolonged PR interval Left atrial enlargement Abnormal R-wave progression, early transition LVH with IVCD, LAD and secondary repol abnrm No significant change since last tracing Confirmed by Ripley Fraise [phone]) on 07/05/2020 1:34:05 AM   Radiology DG Chest Port 1 View  Result Date: 07/05/2020 CLINICAL DATA:  Cough, shortness of breath, chest pain with inspiration EXAM: PORTABLE CHEST 1 VIEW COMPARISON:  Radiograph 04/05/2019 FINDINGS: Diffuse interstitial and patchy airspace opacities are present throughout both lungs, most prominently in the right mid to lower lung with chronic cardiomegaly, vascular congestion and cephalization as well as fissural thickening and few peripheral septal lines. No pneumothorax or visible effusion. Remaining cardiomediastinal contours are unremarkable. No acute osseous or soft tissue abnormality. Telemetry leads overlie the chest. IMPRESSION: 1. Findings consistent with CHF/volume overload with cardiomegaly, vascular congestion and cephalization as well as mixed hazy interstitial and fluffy airspace opacities suggestive of interstitial and alveolar edema though infection cannot be fully excluded in the appropriate clinical context. Electronically Signed   By: Lovena Le M.D.   On: 07/05/2020 03:04    Procedures .Critical Care Performed by: Ripley Fraise, MD Authorized by: Ripley Fraise, MD   Critical care provider statement:     Critical care time (minutes):  60   Critical care start time:  07/05/2020 2:35 AM   Critical care end time:  07/05/2020 3:35 AM   Critical care time was exclusive of:  Separately billable procedures and treating other patients   Critical care was necessary to treat or prevent imminent or life-threatening deterioration of the following conditions:  Respiratory failure   Critical care was time spent personally by me on the following activities:  Ordering and review of radiographic studies, ordering and review of laboratory studies, ordering and performing treatments and interventions, pulse oximetry, re-evaluation of patient's condition, evaluation of patient's response to treatment, development of treatment plan with patient or surrogate, examination of patient, review of old charts and discussions with consultants   I assumed direction of critical care for this patient from another provider in my specialty: no     Medications Ordered in ED Medications  nitroGLYCERIN 50 mg in dextrose 5 % 250 mL (0.2 mg/mL) infusion (40 mcg/min Intravenous Rate/Dose Change 07/05/20 0547)  potassium chloride 10 mEq in 100 mL IVPB (10 mEq Intravenous New Bag/Given 07/05/20 0508)  sodium chloride flush (NS) 0.9 % injection 3 mL (has no administration in time range)  sodium chloride flush (NS) 0.9 % injection 3 mL (has no administration in time range)  0.9 %  sodium chloride infusion (has no administration in time range)  acetaminophen (TYLENOL) tablet 650 mg (has no administration in time range)  ondansetron (ZOFRAN) injection 4 mg (has no administration in time range)  furosemide (LASIX) injection 40 mg (has no administration in time range)  magnesium sulfate IVPB 1 g 100 mL (has no administration in time range)  enoxaparin (LOVENOX) injection 40 mg (has no administration in time range)  nitroGLYCERIN (NITROGLYN) 2 % ointment 1 inch (1 inch Topical Given 07/05/20 0203)  furosemide (LASIX) injection 40 mg (40 mg  Intravenous Given 07/05/20 0201)  HYDROcodone-acetaminophen (NORCO/VICODIN) 5-325 MG per tablet 1 tablet (1 tablet Oral Given 07/05/20 0209)  potassium chloride SA (KLOR-CON) CR tablet 40 mEq (40 mEq Oral Given 07/05/20 0509)  fentaNYL (SUBLIMAZE) injection 100 mcg (100 mcg Intravenous Given 07/05/20 0540)    ED Course  I have reviewed the triage vital signs and the nursing notes.  Pertinent labs & imaging results that were available during my care of the patient were reviewed by me and considered in my medical decision making (see chart for details).    MDM Rules/Calculators/A&P  This patient presents to the ED for concern of shortness of breath, this involves an extensive number of treatment options, and is a complaint that carries with it a high risk of complications and morbidity.  The differential diagnosis includes pulmonary edema, pulmonary embolism, COVID-19, acute coronary syndrome, pneumonia   Lab Tests:   I Ordered, reviewed, and interpreted labs, which included electrolytes, BNP, complete blood count, COVID-19 testing  Medicines ordered:   I ordered medication nitroglycerin and Lasix for pulmonary edema and hypertension  Imaging Studies ordered:   I ordered imaging studies which included chest x-ray   I independently visualized and interpreted imaging which showed pulmonary edema  Additional history obtained:    Previous records obtained and reviewed   Consultations Obtained:   I consulted [facility] hospitalist and discussed lab and imaging findings  Reevaluation:  After the interventions stated above, I reevaluated the patient and found patient is improved  Critical Interventions:  . IV nitroglycerin and Lasix  Patient presented with increasing shortness of breath and hypertension.  Patient has previous history of CHF.  He was found to have acute pulmonary edema with hypertensive emergency.  Patient was given IV nitroglycerin  and Lasix with improvement of blood pressure and is now having diuresis. Patient also noted to have hypokalemia and given potassium supplement  Discussed with Dr. Myna Hidalgo for admission   Gerald [name] was evaluated in Emergency Department on 07/05/2020 for the symptoms described in the history of present illness. He was evaluated in the context of the global COVID-19 pandemic, which necessitated consideration that the patient might be at risk for infection with the SARS-CoV-2 virus that causes COVID-19. Institutional protocols and algorithms that pertain to the evaluation of patients at risk for COVID-19 are in a state of rapid change based on information released by regulatory bodies including the CDC and federal and state organizations. These policies and algorithms were followed during the patient's care in the ED.   Final Clinical Impression(s) / ED Diagnoses Final diagnoses:  Hypertensive emergency  Acute systolic congestive heart failure ([place])  Hypokalemia    Rx / DC Orders ED Discharge Orders    None       Ripley Fraise, MD 07/05/20 0600

## 2020-07-05 NOTE — Progress Notes (Signed)
ReDS Clip Diuretic Study Pt study # [id number]  Your patient is in the Blinded arm of the ReDS Clip Diuretic study.  Your patient has had a ReDS reading and the reading has been transmitted to the cloud.   Thank You   The research team   Kerby Nora, PharmD, BCPS Heart Failure Stewardship Pharmacist Phone [phone]  Please check [email or web address] for unit-specific pharmacist phone numbers

## 2020-07-05 NOTE — ED Notes (Signed)
Report called to Maudie Mercury, Therapist, sports. Denies any questions. Pt placed in portable monitor for transport.

## 2020-07-05 NOTE — H&P (Signed)
History and Physical    Gerald [name] NKN:[id number] DOB: 1981-07-25 DOA: 07/05/2020  PCP: Patient, [name]   Patient coming from: Home   Chief Complaint: SOB   HPI: Gerald [name] is a 39 y.o. male with medical history significant for hypertension, substance abuse in remission, history of atrial fibrillation status post cardioversion, medication noncompliance, and cardiomyopathy with EF 30 to 35%, now presenting to the emergency department for evaluation of shortness of breath.  The patient reports that he noticed the insidious development of shortness of breath approximately 1 week ago, has also been experiencing worsening orthopnea over the same interval.  He has had some mild pedal edema and a cough with pinkish sputum production.  Denies any fevers or chills.  He has had some chest discomfort that has been fairly constant for the past week.  ED Course: Upon arrival to the ED, patient is found to be afebrile, saturating low 90s on room air, tachypneic, slightly tachycardic, and severely hypertensive.  EKG features sinus tachycardia with rate 103, PACs, and LVH.  Chest x-ray is concerning for CHF with cardiomegaly and edema.  Chemistry panel notable for potassium 2.7 and creatinine 1.61.  CBC with a microcytic anemia.  BNP is elevated to 1067 and high-sensitivity troponin is 693.  Patient was started on nitroglycerin infusion and treated with 40 mg IV Lasix, fentanyl, and oral and IV potassium.  Review of Systems:  All other systems reviewed and apart from HPI, are negative.  Past Medical History:  Diagnosis Date  . Arrhythmia    atrial fibrillation  . CHF (congestive heart failure) ([place])   . Hypertension   . Polysubstance abuse [facility])     Past Surgical History:  Procedure Laterality Date  . KNEE SURGERY Right   . TEE WITHOUT CARDIOVERSION N/A 04/04/2019   Procedure: TRANSESOPHAGEAL ECHOCARDIOGRAM (TEE);  Surgeon: [name], MD;  Location: [facility] ORS;  Service:  Cardiovascular;  Laterality: N/A;    Social History:   reports that he has been smoking cigarettes. He has been smoking about 1.00 pack per day. He has never used smokeless tobacco. He reports current alcohol use. He reports current drug use. Drug: Cocaine.  No Known Allergies  Family History  Problem Relation Age of Onset  . Hypertension Father      Prior to Admission medications   Medication Sig Start Date End Date Taking? Authorizing Provider  atorvastatin (LIPITOR) 40 MG tablet Take 1 tablet (40 mg total) by mouth daily. 05/06/19   Alisa Graff, FNP  carvedilol (COREG) 25 MG tablet Take 1 tablet (25 mg total) by mouth 2 (two) times daily with a meal. 04/09/19 05/09/19  Pyreddy, Reatha Harps, MD  cloNIDine (CATAPRES) 0.1 MG tablet Take 1 tablet (0.1 mg total) by mouth 2 (two) times daily. 04/09/19   Saundra Shelling, MD  hydrALAZINE (APRESOLINE) 100 MG tablet Take 100 mg by mouth 2 (two) times daily.    [provider]  hydrochlorothiazide (HYDRODIURIL) 25 MG tablet Take 1 tablet (25 mg total) by mouth daily. 05/06/19 06/05/19  Alisa Graff, FNP  isosorbide mononitrate (IMDUR) 30 MG 24 hr tablet Take 30 mg by mouth daily.    [provider]  potassium chloride SA (K-DUR) 20 MEQ tablet Take 1 tablet (20 mEq total) by mouth daily. 05/06/19 06/05/19  Alisa Graff, FNP  rivaroxaban (XARELTO) 20 MG TABS tablet Take 1 tablet (20 mg total) by mouth daily with supper. 05/06/19 06/05/19  Alisa Graff, FNP  sacubitril-valsartan (ENTRESTO) 49-51 MG Take 1 tablet by mouth 2 (two) times daily. 05/06/19   Alisa Graff, FNP  spironolactone (ALDACTONE) 50 MG tablet Take 1 tablet (50 mg total) by mouth daily. 05/06/19 06/05/19  Alisa Graff, FNP    Physical Exam: Vitals:   07/05/20 0128 07/05/20 0129 07/05/20 0306 07/05/20 0415  BP:  (!) 196/149 (!) 188/150 (!) 173/112  Pulse:   (!) 107 (!) 104  Resp:   (!) 31 (!) 30  Temp:      TempSrc:      SpO2:   92% 92%  Weight: 106.6 kg      Height: 6' (1.829 m)       Constitutional: NAD, calm  Eyes: PERTLA, lids and conjunctivae normal ENMT: Mucous membranes are moist. Posterior pharynx clear of any exudate or lesions.   Neck: normal, supple, no masses, no thyromegaly Respiratory: Diffuse rales, tachypnea. No pallor or cyanosis.  Cardiovascular: S1 & S2 heard, regular rate and rhythm. No extremity edema.  Abdomen: No distension, no tenderness, soft. Bowel sounds active.  Musculoskeletal: no clubbing / cyanosis. No joint deformity upper and lower extremities.   Skin: no significant rashes, lesions, ulcers. Warm, dry, well-perfused. Neurologic: No gross facial asymmetry. Sensation intact. Moving all extremities.  Psychiatric: Alert and oriented to person, place, and situation. Pleasant and cooperative.    Labs and Imaging on Admission: I have personally reviewed following labs and imaging studies  CBC: Recent Labs  Lab 07/05/20 0204  WBC 7.9  NEUTROABS 6.0  HGB 9.5*  HCT 31.6*  MCV 68.7*  PLT 765   Basic Metabolic Panel: Recent Labs  Lab 07/05/20 0204  NA 140  K 2.7*  CL 107  CO2 25  GLUCOSE 112*  BUN 22*  CREATININE 1.61*  CALCIUM 8.2*   GFR: Estimated Creatinine Clearance: 77.7 mL/min (A) (by C-G formula based on SCr of 1.61 mg/dL (H)). Liver Function Tests: Recent Labs  Lab 07/05/20 0204  AST 24  ALT 34  ALKPHOS 77  BILITOT 0.8  PROT 5.7*  ALBUMIN 2.6*   No results for input(s): LIPASE, AMYLASE in the last 168 hours. No results for input(s): AMMONIA in the last 168 hours. Coagulation Profile: No results for input(s): INR, PROTIME in the last 168 hours. Cardiac Enzymes: No results for input(s): CKTOTAL, CKMB, CKMBINDEX, TROPONINI in the last 168 hours. BNP (last 3 results) No results for input(s): PROBNP in the last 8760 hours. HbA1C: No results for input(s): HGBA1C in the last 72 hours. CBG: No results for input(s): GLUCAP in the last 168 hours. Lipid Profile: No results for  input(s): CHOL, HDL, LDLCALC, TRIG, CHOLHDL, LDLDIRECT in the last 72 hours. Thyroid Function Tests: No results for input(s): TSH, T4TOTAL, FREET4, T3FREE, THYROIDAB in the last 72 hours. Anemia Panel: No results for input(s): VITAMINB12, FOLATE, FERRITIN, TIBC, IRON, RETICCTPCT in the last 72 hours. Urine analysis: No results found for: COLORURINE, APPEARANCEUR, LABSPEC, PHURINE, GLUCOSEU, HGBUR, BILIRUBINUR, KETONESUR, PROTEINUR, UROBILINOGEN, NITRITE, LEUKOCYTESUR Sepsis Labs: @LABRCNTIP (procalcitonin:4,lacticidven:4) ) Recent Results (from the past 240 hour(s))  Respiratory Panel by RT PCR (Flu A&B, Covid) - Nasopharyngeal Swab     Status: None   Collection Time: 07/05/20  2:04 AM   Specimen: Nasopharyngeal Swab  Result Value Ref Range Status   SARS Coronavirus 2 by RT PCR NEGATIVE NEGATIVE Final    Comment: (NOTE) SARS-CoV-2 target nucleic acids are NOT DETECTED.  The SARS-CoV-2 RNA is generally detectable in upper respiratoy specimens during the acute phase of infection. The  lowest concentration of SARS-CoV-2 viral copies this assay can detect is 131 copies/mL. A negative result does not preclude SARS-Cov-2 infection and should not be used as the sole basis for treatment or other patient management decisions. A negative result may occur with  improper specimen collection/handling, submission of specimen other than nasopharyngeal swab, presence of viral mutation(s) within the areas targeted by this assay, and inadequate number of viral copies (<131 copies/mL). A negative result must be combined with clinical observations, patient history, and epidemiological information. The expected result is Negative.  Fact Sheet for Patients:  PinkCheek.be  Fact Sheet for Healthcare Providers:  GravelBags.it  This test is no t yet approved or cleared by the [place] FDA and  has been authorized for detection and/or diagnosis  of SARS-CoV-2 by FDA under an Emergency Use Authorization (EUA). This EUA will remain  in effect (meaning this test can be used) for the duration of the COVID-19 declaration under Section 564(b)(1) of the Act, 21 U.S.C. section 360bbb-3(b)(1), unless the authorization is terminated or revoked sooner.     Influenza A by PCR NEGATIVE NEGATIVE Final   Influenza B by PCR NEGATIVE NEGATIVE Final    Comment: (NOTE) The Xpert Xpress SARS-CoV-2/FLU/RSV assay is intended as an aid in  the diagnosis of influenza from Nasopharyngeal swab specimens and  should not be used as a sole basis for treatment. Nasal washings and  aspirates are unacceptable for Xpert Xpress SARS-CoV-2/FLU/RSV  testing.  Fact Sheet for Patients: PinkCheek.be  Fact Sheet for Healthcare Providers: GravelBags.it  This test is not yet approved or cleared by the [place] FDA and  has been authorized for detection and/or diagnosis of SARS-CoV-2 by  FDA under an Emergency Use Authorization (EUA). This EUA will remain  in effect (meaning this test can be used) for the duration of the  Covid-19 declaration under Section 564(b)(1) of the Act, 21  U.S.C. section 360bbb-3(b)(1), unless the authorization is  terminated or revoked. Performed at [facility], [place] [address] [place], [place] [address]      Radiological Exams on Admission: DG Chest Port 1 View  Result Date: 07/05/2020 CLINICAL DATA:  Cough, shortness of breath, chest pain with inspiration EXAM: PORTABLE CHEST 1 VIEW COMPARISON:  Radiograph 04/05/2019 FINDINGS: Diffuse interstitial and patchy airspace opacities are present throughout both lungs, most prominently in the right mid to lower lung with chronic cardiomegaly, vascular congestion and cephalization as well as fissural thickening and few peripheral septal lines. No pneumothorax or visible effusion. Remaining cardiomediastinal contours are  unremarkable. No acute osseous or soft tissue abnormality. Telemetry leads overlie the chest. IMPRESSION: 1. Findings consistent with CHF/volume overload with cardiomegaly, vascular congestion and cephalization as well as mixed hazy interstitial and fluffy airspace opacities suggestive of interstitial and alveolar edema though infection cannot be fully excluded in the appropriate clinical context. Electronically Signed   By: Lovena Le M.D.   On: 07/05/2020 03:04    EKG: Independently reviewed. Sinus tachycardia, rate 103, PACs, LVH, LAD.   Assessment/Plan   1. Acute on chronic systolic CHF  - Presents with 1 week of progressive SOB, orthopnea, and now pink sputum and is found to be severely hypertensive with edema on CXR  - EF was 30-35% in August 2020 - He was given IV Lasix and nitroglycerin in ED and is diuresing well  - Continue diuresis with Lasix 40 mg IV q12h, continue NTG infusion for now, monitor weight and I/Os, follow-up pharmacy medication-reconciliation    2.  Hypertensive crisis  - BP 200/150 initially, improving with NTG and Lasix  - Continue diuresis, titrate NTG infusion, follow-up pharmacy medication-reconciliation    3. Elevated troponin  - HS troponin 693 in ED, then 622  - Likely type [name] NSTEMI in setting of severe HTN and acute CHF - Continue cardiac monitoring, continue to treat CHF as above, check echocardiogram    4. Hypokalemia  - Serum potassium is 2.7 on admission, treated with IV and oral potassium in ED, given empiric mag  - Will need to follow closely while diuresing, repeat BMP later today   5. CKD [name]-IIIa   - SCr is 1.61 on admission, baseline might be closer to 1.3  - Monitor renal function and electrolytes with diuresis, renally-dose medications   6. History of atrial fibrillation  - Patient reports maintenance of SR since cardioversion last year  - In sinus rhythm on admission    7. Microcytic anemia  - Hgb is 9.5 on admission, similar to  priors with no bleeding, will monitor -   DVT prophylaxis: Lovenox  Code Status: Full  Family Communication: Discussed with patient  Disposition Plan:  Patient is from: Home  Anticipated d/c is to: Home  Anticipated d/c date is: 07/08/20 Patient currently: Pending improvement in SOB and BP with diuresis, echocardiogram  Consults called: None  Admission status: Inpatient     Vianne Bulls, MD [facility]  07/05/2020, 5:47 AM

## 2020-07-05 NOTE — ED Notes (Signed)
Lunch Tray Ordered @ 1000. 

## 2020-07-05 NOTE — ED Triage Notes (Signed)
Pt arrived via ems from home due to shob. Pt states he has had a cough and felt sob x 2 days. Reports cp with inspiration and coughing. Received x1 covid vaccine

## 2020-07-05 NOTE — Progress Notes (Signed)
PROGRESS NOTE    Gerald [name]  HKV:[id number] DOB: 1981-07-08 DOA: 07/05/2020 PCP: Patient, [name]   Brief Narrative:  Gerald [name] is a 39 y.o. male with medical history significant for hypertension, substance abuse in remission, history of atrial fibrillation status post cardioversion, medication noncompliance, and cardiomyopathy with EF 30 to 35%, now presenting to the emergency department for evaluation of shortness of breath.  The patient reports that he noticed the insidious development of shortness of breath approximately 1 week ago, has also been experiencing worsening orthopnea over the same interval.  He has had some mild pedal edema and a cough with pinkish sputum production.  Denies any fevers or chills.  He has had some chest discomfort that has been fairly constant for the past week.  Upon arrival to the ED, patient is found to be afebrile, saturating low 90s on room air, tachypneic, slightly tachycardic, and severely hypertensive.  EKG features sinus tachycardia with rate 103, PACs, and LVH.  Chest x-ray is concerning for CHF with cardiomegaly and edema.  Chemistry panel notable for potassium 2.7 and creatinine 1.61.  CBC with a microcytic anemia.  BNP is elevated to 1067 and high-sensitivity troponin is 693.  Patient was started on nitroglycerin infusion and treated with 40 mg IV Lasix, fentanyl, and oral and IV potassium.   Assessment & Plan:   Principal Problem:   Acute on chronic systolic CHF (congestive heart failure) ([name]) Active Problems:   Acute systolic congestive heart failure ([name])   Hypertensive urgency   PAF (paroxysmal atrial fibrillation) ([name])   Elevated troponin   Microcytic anemia   Mild renal insufficiency   Hypokalemia   Acute hypertensive emergency, POA Concurrent flash pulmonary edema without hypoxia -Secondary to medication noncompliance -Patient admits to skipping, if not discontinuing, home medications -Blood pressure markedly  elevated at intake 200/150 improving with nitroglycerin drip -Resume patient's home medications including carvedilol, HCTZ, hydralazine, will add amlodipine and discontinue clonidine given his noncompliance and concern for rebound hypertension -We will continue to wean nitroglycerin drip per protocol -Acute heart failure exacerbation below likely provoked in the setting of markedly uncontrolled hypertension  Acute on chronic systolic CHF  - Secondary to hypertensive emergency as above  - Reports 1 week of worsening orthopnea and dyspnea with exertion - EF was 30-35% in August 2020 - Continue diuresis Lasix 40 IV twice daily, follow hypokalemia as below -We will need to follow-up for core measures, patient not on ACE, ARB, or diuretic but is on spironolactone and beta-blocker per med review  Elevated troponin in the setting of hypertensive emergency, patient is without chest pain  - Supply demand mismatch in the setting of hypertensive emergency and likely VQ mismatch in the setting of pleural edema and heart failure exacerbation - Continue cardiac monitoring -Echocardiogram ordered at admission   Profound hypokalemia  - Potassium minimally improving in the setting of ongoing diuresis -Replete with an additional 29 M EQ tonight follow morning labs  CKD 3 a   - Baseline around 1.3 - Monitor renal function in the setting of diuresis  History of persistent A. fib, status post cardioversion POA   - Patient has very poor follow-up but indicates he is remained in sinus rhythm since cardioversion last year, unlikely that he would be able to confirm this without routine monitoring - Currently in sinus rhythm - Continue home anticoagulation until we can confirm patient's chronic rhythm; certainly worth discussing with PCP or cardiology in the outpatient setting if  patient truly remains in sinus rhythm with low burden of A. fib  Chronic microcytic anemia, likely in the setting of chronic  disease  - Hgb is 9.5 at admission, appears to be near distant baseline, patient denies any recent bleeding or easy bruising or dark or maroon stool  DVT prophylaxis: Lovenox  Code Status: Full  Family Communication:  None available, discussed with patient   Status is: Inpatient  Dispo: The patient is from: Home              Anticipated d/c is to: Home Home              Anticipated d/c date is: 24 to 48 hours pending clinical course              Patient currently not medically stable for discharge given ongoing need for nitro drip, close monitoring and telemetry in the setting of hypertensive emergency with flash pulmonary edema versus heart failure and acute respiratory failure without hypoxia  Consultants:   None  Procedures:   None  Antimicrobials:  None indicated  Subjective: No acute issues or events overnight, patient is complaining of mild bitemporal headache throbbing in nature otherwise denies nausea, vomiting, chest pain, shortness of breath, fevers, chills  Objective: Vitals:   07/05/20 0730 07/05/20 0755 07/05/20 0800 07/05/20 0810  BP: (!) 179/123 (!) 173/137 (!) 176/110 (!) 188/117  Pulse: 94 97 96 98  Resp: 13 20 (!) 38 20  Temp:      TempSrc:      SpO2: 95% 93% 90% (!) 89%  Weight:      Height:        Intake/Output Summary (Last 24 hours) at 07/05/2020 0829 Last data filed at 07/05/2020 0719 Gross per 24 hour  Intake --  Output 2000 ml  Net -2000 ml   Filed Weights   07/05/20 0128  Weight: 106.6 kg    Examination:  General:  Pleasantly resting in bed, No acute distress. HEENT:  Normocephalic atraumatic.  Sclerae nonicteric, noninjected.  Extraocular movements intact bilaterally. Neck:  Without mass or deformity.  Trachea is midline. Lungs:  Clear to auscultate bilaterally without rhonchi, wheeze, or rales. Heart:  Regular rate and rhythm.  Without murmurs, rubs, or gallops. Abdomen:  Soft, nontender, nondistended.  Without guarding or  rebound. Extremities: Without cyanosis, clubbing, edema, or obvious deformity. Vascular:  Dorsalis pedis and posterior tibial pulses palpable bilaterally. Skin:  Warm and dry, no erythema, no ulcerations.    Data Reviewed: I have personally reviewed following labs and imaging studies  CBC: Recent Labs  Lab 07/05/20 0204  WBC 7.9  NEUTROABS 6.0  HGB 9.5*  HCT 31.6*  MCV 68.7*  PLT 720   Basic Metabolic Panel: Recent Labs  Lab 07/05/20 0204  NA 140  K 2.7*  CL 107  CO2 25  GLUCOSE 112*  BUN 22*  CREATININE 1.61*  CALCIUM 8.2*   GFR: Estimated Creatinine Clearance: 77.7 mL/min (A) (by C-G formula based on SCr of 1.61 mg/dL (H)). Liver Function Tests: Recent Labs  Lab 07/05/20 0204  AST 24  ALT 34  ALKPHOS 77  BILITOT 0.8  PROT 5.7*  ALBUMIN 2.6*   No results for input(s): LIPASE, AMYLASE in the last 168 hours. No results for input(s): AMMONIA in the last 168 hours. Coagulation Profile: No results for input(s): INR, PROTIME in the last 168 hours. Cardiac Enzymes: No results for input(s): CKTOTAL, CKMB, CKMBINDEX, TROPONINI in the last 168 hours. BNP (last 3 results)  No results for input(s): PROBNP in the last 8760 hours. HbA1C: No results for input(s): HGBA1C in the last 72 hours. CBG: No results for input(s): GLUCAP in the last 168 hours. Lipid Profile: No results for input(s): CHOL, HDL, LDLCALC, TRIG, CHOLHDL, LDLDIRECT in the last 72 hours. Thyroid Function Tests: No results for input(s): TSH, T4TOTAL, FREET4, T3FREE, THYROIDAB in the last 72 hours. Anemia Panel: No results for input(s): VITAMINB12, FOLATE, FERRITIN, TIBC, IRON, RETICCTPCT in the last 72 hours. Sepsis Labs: No results for input(s): PROCALCITON, LATICACIDVEN in the last 168 hours.  Recent Results (from the past 240 hour(s))  Respiratory Panel by RT PCR (Flu A&B, Covid) - Nasopharyngeal Swab     Status: None   Collection Time: 07/05/20  2:04 AM   Specimen: Nasopharyngeal Swab    Result Value Ref Range Status   SARS Coronavirus 2 by RT PCR NEGATIVE NEGATIVE Final    Comment: (NOTE) SARS-CoV-2 target nucleic acids are NOT DETECTED.  The SARS-CoV-2 RNA is generally detectable in upper respiratoy specimens during the acute phase of infection. The lowest concentration of SARS-CoV-2 viral copies this assay can detect is 131 copies/mL. A negative result does not preclude SARS-Cov-2 infection and should not be used as the sole basis for treatment or other patient management decisions. A negative result may occur with  improper specimen collection/handling, submission of specimen other than nasopharyngeal swab, presence of viral mutation(s) within the areas targeted by this assay, and inadequate number of viral copies (<131 copies/mL). A negative result must be combined with clinical observations, patient history, and epidemiological information. The expected result is Negative.  Fact Sheet for Patients:  PinkCheek.be  Fact Sheet for Healthcare Providers:  GravelBags.it  This test is no t yet approved or cleared by the [place] FDA and  has been authorized for detection and/or diagnosis of SARS-CoV-2 by FDA under an Emergency Use Authorization (EUA). This EUA will remain  in effect (meaning this test can be used) for the duration of the COVID-19 declaration under Section 564(b)(1) of the Act, 21 U.S.C. section 360bbb-3(b)(1), unless the authorization is terminated or revoked sooner.     Influenza A by PCR NEGATIVE NEGATIVE Final   Influenza B by PCR NEGATIVE NEGATIVE Final    Comment: (NOTE) The Xpert Xpress SARS-CoV-2/FLU/RSV assay is intended as an aid in  the diagnosis of influenza from Nasopharyngeal swab specimens and  should not be used as a sole basis for treatment. Nasal washings and  aspirates are unacceptable for Xpert Xpress SARS-CoV-2/FLU/RSV  testing.  Fact Sheet for  Patients: PinkCheek.be  Fact Sheet for Healthcare Providers: GravelBags.it  This test is not yet approved or cleared by the [place] FDA and  has been authorized for detection and/or diagnosis of SARS-CoV-2 by  FDA under an Emergency Use Authorization (EUA). This EUA will remain  in effect (meaning this test can be used) for the duration of the  Covid-19 declaration under Section 564(b)(1) of the Act, 21  U.S.C. section 360bbb-3(b)(1), unless the authorization is  terminated or revoked. Performed at [facility], [place] [address], [place], [place] [address]          Radiology Studies: DG Chest Port 1 View  Result Date: 07/05/2020 CLINICAL DATA:  Cough, shortness of breath, chest pain with inspiration EXAM: PORTABLE CHEST 1 VIEW COMPARISON:  Radiograph 04/05/2019 FINDINGS: Diffuse interstitial and patchy airspace opacities are present throughout both lungs, most prominently in the right mid to lower lung with chronic cardiomegaly, vascular congestion  and cephalization as well as fissural thickening and few peripheral septal lines. No pneumothorax or visible effusion. Remaining cardiomediastinal contours are unremarkable. No acute osseous or soft tissue abnormality. Telemetry leads overlie the chest. IMPRESSION: 1. Findings consistent with CHF/volume overload with cardiomegaly, vascular congestion and cephalization as well as mixed hazy interstitial and fluffy airspace opacities suggestive of interstitial and alveolar edema though infection cannot be fully excluded in the appropriate clinical context. Electronically Signed   By: Lovena Le M.D.   On: 07/05/2020 03:04        Scheduled Meds: . carvedilol  25 mg Oral BID WC  . enoxaparin (LOVENOX) injection  40 mg Subcutaneous Daily  . furosemide  40 mg Intravenous BID  . hydrALAZINE  100 mg Oral BID  . hydrochlorothiazide  25 mg Oral Daily  . rivaroxaban  20 mg  Oral Q supper  . sodium chloride flush  3 mL Intravenous Q12H   Continuous Infusions: . sodium chloride    . nitroGLYCERIN 65 mcg/min (07/05/20 0817)     LOS: 0 days   Time spent: 12min  Efe Fazzino C Harlow Basley, DO [facility]  If 7PM-7AM, please contact night-coverage [email or web address]  07/05/2020, 8:29 AM

## 2020-07-05 NOTE — Progress Notes (Signed)
Patient admitted to room [id number]. Patient is alert and oriented x4, ambulatory on nitroglycerin gtt. Patient reported headache and new onset bilateral leg pain and sorness that started in the ED. BLE are cool to touch, no indication of swelling or redness. Patient's BP is 172/105, HR 91 , 94% on RA. RN administered PRN tylenol for headache and provided patient with phone and call lights and meal menu. Patient states "I just need to eat something and I'll be fine". Will continue to monitor.

## 2020-07-05 NOTE — ED Notes (Signed)
Nitro drip increased d/t elevated BP. Pt continues to c/o headache.

## 2020-07-06 ENCOUNTER — Inpatient Hospital Stay (HOSPITAL_COMMUNITY): Payer: Self-pay

## 2020-07-06 DIAGNOSIS — I5023 Acute on chronic systolic (congestive) heart failure: Secondary | ICD-10-CM

## 2020-07-06 DIAGNOSIS — I161 Hypertensive emergency: Secondary | ICD-10-CM

## 2020-07-06 DIAGNOSIS — I10 Essential (primary) hypertension: Secondary | ICD-10-CM

## 2020-07-06 LAB — BASIC METABOLIC PANEL
Anion gap: 9 (ref 5–15)
BUN: 23 mg/dL — ABNORMAL HIGH (ref 6–20)
CO2: 27 mmol/L (ref 22–32)
Calcium: 8.2 mg/dL — ABNORMAL LOW (ref 8.9–10.3)
Chloride: 101 mmol/L (ref 98–111)
Creatinine, Ser: 2 mg/dL — ABNORMAL HIGH (ref 0.61–1.24)
GFR, Estimated: 43 mL/min — ABNORMAL LOW (ref >60)
Glucose, Bld: 110 mg/dL — ABNORMAL HIGH (ref 70–99)
Potassium: 2.8 mmol/L — ABNORMAL LOW (ref 3.5–5.1)
Sodium: 137 mmol/L (ref 135–145)

## 2020-07-06 LAB — CBC
HCT: 29.6 % — ABNORMAL LOW (ref 39.0–52.0)
Hemoglobin: 9.2 g/dL — ABNORMAL LOW (ref 13.0–17.0)
MCH: 20.8 pg — ABNORMAL LOW (ref 26.0–34.0)
MCHC: 31.1 g/dL (ref 30.0–36.0)
MCV: 66.8 fL — ABNORMAL LOW (ref 80.0–100.0)
Platelets: 246 10*3/uL (ref 150–400)
RBC: 4.43 MIL/uL (ref 4.22–5.81)
RDW: 16.5 % — ABNORMAL HIGH (ref 11.5–15.5)
WBC: 8 10*3/uL (ref 4.0–10.5)
nRBC: 0 % (ref 0.0–0.2)

## 2020-07-06 LAB — MAGNESIUM: Magnesium: 2.1 mg/dL (ref 1.7–2.4)

## 2020-07-06 LAB — HIV ANTIBODY (ROUTINE TESTING W REFLEX): HIV Screen 4th Generation wRfx: NONREACTIVE

## 2020-07-06 MED ORDER — LISINOPRIL 10 MG PO TABS
10.0000 mg | ORAL_TABLET | Freq: Every day | ORAL | Status: DC
  Administered 2020-07-06: 10 mg via ORAL
  Filled 2020-07-06: qty 1

## 2020-07-06 MED ORDER — ZOLPIDEM TARTRATE 5 MG PO TABS
5.0000 mg | ORAL_TABLET | Freq: Every evening | ORAL | Status: DC | PRN
  Administered 2020-07-06: 5 mg via ORAL
  Filled 2020-07-06: qty 1

## 2020-07-06 MED ORDER — SPIRONOLACTONE 25 MG PO TABS
25.0000 mg | ORAL_TABLET | Freq: Every day | ORAL | Status: DC
  Administered 2020-07-06 – 2020-07-07 (×2): 25 mg via ORAL
  Filled 2020-07-06 (×2): qty 1

## 2020-07-06 MED ORDER — SACUBITRIL-VALSARTAN 49-51 MG PO TABS: 1.0000 | ORAL_TABLET | Freq: Two times a day (BID) | ORAL | Status: DC

## 2020-07-06 MED ORDER — POTASSIUM CHLORIDE 10 MEQ/100ML IV SOLN
INTRAVENOUS | Status: AC
  Filled 2020-07-06: qty 100

## 2020-07-06 MED ORDER — LOSARTAN POTASSIUM 50 MG PO TABS
50.0000 mg | ORAL_TABLET | Freq: Once | ORAL | Status: AC
  Administered 2020-07-07: 50 mg via ORAL
  Filled 2020-07-06: qty 1

## 2020-07-06 MED ORDER — AMLODIPINE BESYLATE 10 MG PO TABS
10.0000 mg | ORAL_TABLET | Freq: Every day | ORAL | Status: DC
  Administered 2020-07-06: 10 mg via ORAL
  Filled 2020-07-06 (×2): qty 1

## 2020-07-06 MED ORDER — POTASSIUM CHLORIDE CRYS ER 20 MEQ PO TBCR
40.0000 meq | EXTENDED_RELEASE_TABLET | Freq: Four times a day (QID) | ORAL | Status: AC
  Administered 2020-07-06 (×2): 40 meq via ORAL
  Filled 2020-07-06 (×2): qty 2

## 2020-07-06 NOTE — Consult Note (Addendum)
CARDIOLOGY CONSULT NOTE       Patient ID: MANAV PIEROTTI MRN: [id number] DOB/AGE: [birth year] 39 y.o.  Admit date: 07/05/2020 Referring Physician: Opyd Primary Physician: Patient, [name] Primary Cardiologist: Rockey Situ Reason for Consultation: CHF/HTN  Principal Problem:   Acute on chronic systolic CHF (congestive heart failure) ([place]) Active Problems:   Acute systolic congestive heart failure ([name])   Hypertensive urgency   PAF (paroxysmal atrial fibrillation) ([name])   Elevated troponin   Microcytic anemia   Mild renal insufficiency   Hypokalemia   HPI:  39 y.o. 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 Since then has not been compliant with his meds. He has a history of polysubstance abuse/cocaine, ETOH and smokes. He has had increased edema, dyspnea last few weeks Denies chest pain. Admits to poor lifestyle habits denies recurrent drug use and indicates stopped smoking 48 hours ago.  BNP elevated 1066.  Troponin in 600 range with no trend. Respiratory panel including COVID negative. K 2.8 and Cr 1.6 He was off his xarelto as well but no embolic complications. This am feels better BP improved no chest pain   ROS All other systems reviewed and negative except as noted above  Past Medical History:  Diagnosis Date  . Arrhythmia    atrial fibrillation  . CHF (congestive heart failure) ([place])   . Hypertension   . Polysubstance abuse ([place])     Family History  Problem Relation Age of Onset  . Hypertension Father     Social History   Socioeconomic History  . Marital status: Single    Spouse name: Not on file  . Number of children: Not on file  . Years of education: Not on file  . Highest education level: Not on file  Occupational History  . Not on file  Tobacco Use  . Smoking  status: Current Every Day Smoker    Packs/day: 1.00    Types: Cigarettes  . Smokeless tobacco: Never Used  Substance and Sexual Activity  . Alcohol use: Yes    Comment: occas.   . Drug use: Yes    Types: Cocaine  . Sexual activity: Not on file  Other Topics Concern  . Not on file  Social History Narrative  . Not on file   Social Determinants of Health   Financial Resource Strain:   . Difficulty of Paying Living Expenses: Not on file  Food Insecurity:   . Worried About Charity fundraiser in the Last Year: Not on file  . Ran Out of Food in the Last Year: Not on file  Transportation Needs:   . Lack of Transportation (Medical): Not on file  . Lack of Transportation (Non-Medical): Not on file  Physical Activity:   . Days of Exercise per Week: Not on file  . Minutes of Exercise per Session: Not on file  Stress:   . Feeling of Stress : Not on file  Social Connections:   . Frequency of Communication with Friends and Family: Not on file  . Frequency of Social Gatherings with Friends and Family: Not on file  . Attends Religious Services: Not on file  . Active Member of Clubs or Organizations: Not on file  . Attends Archivist Meetings: Not on file  .  Marital Status: Not on file  Intimate Partner Violence:   . Fear of Current or Ex-Partner: Not on file  . Emotionally Abused: Not on file  . Physically Abused: Not on file  . Sexually Abused: Not on file    Past Surgical History:  Procedure Laterality Date  . KNEE SURGERY Right   . TEE WITHOUT CARDIOVERSION N/A 04/04/2019   Procedure: TRANSESOPHAGEAL ECHOCARDIOGRAM (TEE);  Surgeon: [name], MD;  Location: [facility] ORS;  Service: Cardiovascular;  Laterality: N/A;      Current Facility-Administered Medications:  .  0.9 %  sodium chloride infusion, 250 mL, Intravenous, PRN, Opyd, Ilene Qua, MD, Stopped at 07/06/20 0530 .  acetaminophen (TYLENOL) tablet 650 mg, 650 mg, Oral, Q4H PRN, Opyd, Ilene Qua, MD, 650 mg at  07/06/20 0537 .  amLODipine (NORVASC) tablet 10 mg, 10 mg, Oral, Daily, Little Ishikawa, MD, 10 mg at 07/06/20 0834 .  carvedilol (COREG) tablet 25 mg, 25 mg, Oral, BID WC, Little Ishikawa, MD, 25 mg at 07/06/20 9381 .  furosemide (LASIX) injection 40 mg, 40 mg, Intravenous, BID, Opyd, Ilene Qua, MD, 40 mg at 07/06/20 0827 .  hydrALAZINE (APRESOLINE) tablet 100 mg, 100 mg, Oral, BID, Opyd, Ilene Qua, MD, 100 mg at 07/06/20 0828 .  hydrochlorothiazide (HYDRODIURIL) tablet 25 mg, 25 mg, Oral, Daily, Little Ishikawa, MD, 25 mg at 07/06/20 0175 .  ibuprofen (ADVIL) tablet 800 mg, 800 mg, Oral, Q8H PRN, Little Ishikawa, MD, 800 mg at 07/06/20 0829 .  lisinopril (ZESTRIL) tablet 10 mg, 10 mg, Oral, Daily, Little Ishikawa, MD, 10 mg at 07/06/20 0829 .  ondansetron (ZOFRAN) injection 4 mg, 4 mg, Intravenous, Q6H PRN, Opyd, Ilene Qua, MD .  rivaroxaban (XARELTO) tablet 20 mg, 20 mg, Oral, Q supper, Little Ishikawa, MD, 20 mg at 07/05/20 1605 .  sodium chloride flush (NS) 0.9 % injection 3 mL, 3 mL, Intravenous, Q12H, Opyd, Ilene Qua, MD, 3 mL at 07/06/20 0831 .  sodium chloride flush (NS) 0.9 % injection 3 mL, 3 mL, Intravenous, PRN, Opyd, Timothy S, MD . amLODipine  10 mg Oral Daily  . carvedilol  25 mg Oral BID WC  . furosemide  40 mg Intravenous BID  . hydrALAZINE  100 mg Oral BID  . hydrochlorothiazide  25 mg Oral Daily  . lisinopril  10 mg Oral Daily  . rivaroxaban  20 mg Oral Q supper  . sodium chloride flush  3 mL Intravenous Q12H   . sodium chloride Stopped (07/06/20 0530)    Physical Exam: Blood pressure (!) 139/91, pulse 67, temperature 98.2 F (36.8 C), temperature source Axillary, resp. rate 16, height 6' (1.829 m), weight 109.9 kg, SpO2 100 %.    Affect appropriate Overweight black male  HEENT: normal Neck supple with no adenopathy JVP normal no bruits no thyromegaly Lungs clear with no wheezing and good diaphragmatic motion Heart:  S1/S2 no murmur,  no rub, gallop or click PMI increased  Abdomen: benighn, BS positve, no tenderness, no AAA no bruit.  No HSM or HJR Distal pulses intact with no bruits Plus one bilateral  edema Neuro non-focal Skin warm and dry No muscular weakness   Labs:   Lab Results  Component Value Date   WBC 8.0 07/06/2020   HGB 9.2 (L) 07/06/2020   HCT 29.6 (L) 07/06/2020   MCV 66.8 (L) 07/06/2020   PLT 246 07/06/2020    Recent Labs  Lab 07/05/20 0204 07/05/20 1210 07/06/20 0306  NA  140   < > 137  K 2.7*   < > 2.8*  CL 107   < > 101  CO2 25   < > 27  BUN 22*   < > 23*  CREATININE 1.61*   < > 2.00*  CALCIUM 8.2*   < > 8.2*  PROT 5.7*  --   --   BILITOT 0.8  --   --   ALKPHOS 77  --   --   ALT 34  --   --   AST 24  --   --   GLUCOSE 112*   < > 110*   < > = values in this interval not displayed.   Lab Results  Component Value Date   TROPONINI 0.18 (H) 12/21/2015   No results found for: CHOL No results found for: HDL No results found for: LDLCALC No results found for: TRIG No results found for: CHOLHDL No results found for: LDLDIRECT    Radiology: DG Chest Port 1 View  Result Date: 07/05/2020 CLINICAL DATA:  Cough, shortness of breath, chest pain with inspiration EXAM: PORTABLE CHEST 1 VIEW COMPARISON:  Radiograph 04/05/2019 FINDINGS: Diffuse interstitial and patchy airspace opacities are present throughout both lungs, most prominently in the right mid to lower lung with chronic cardiomegaly, vascular congestion and cephalization as well as fissural thickening and few peripheral septal lines. No pneumothorax or visible effusion. Remaining cardiomediastinal contours are unremarkable. No acute osseous or soft tissue abnormality. Telemetry leads overlie the chest. IMPRESSION: 1. Findings consistent with CHF/volume overload with cardiomegaly, vascular congestion and cephalization as well as mixed hazy interstitial and fluffy airspace opacities suggestive of interstitial and alveolar edema though  infection cannot be fully excluded in the appropriate clinical context. Electronically Signed   By: Lovena Le M.D.   On: 07/05/2020 03:04   ECHOCARDIOGRAM COMPLETE  Result Date: 07/05/2020    ECHOCARDIOGRAM REPORT   Patient Name:   CLEOPHUS MENDONSA II Date of Exam: 07/05/2020 Medical Rec #:  [id number]        Height:       72.0 in Accession #:    [id number]       Weight:       235.0 lb Date of Birth:  1981/06/28        BSA:          2.282 m Patient Age:    34 years         BP:           177/128 mmHg Patient Gender: M                HR:           88 bpm. Exam Location:  Inpatient Procedure: 2D Echo, Cardiac Doppler and Color Doppler Indications:    I50.23 Acute on chronic systolic (congestive) heart failure                 Elevated Troponin.  History:        Patient has prior history of Echocardiogram examinations, most                 recent 04/04/2019. Risk Factors:Hypertension. Polysubstance                 abuse. Arrhythmia.  Sonographer:    Jonelle Sidle Dance Referring Phys: [id number] [place]  1. Left ventricular ejection fraction, by estimation, is 30 to 35%. The left ventricle has moderately decreased function. The left ventricle demonstrates global  hypokinesis. The left ventricular internal cavity size was mildly dilated. There is severe left ventricular hypertrophy. Left ventricular diastolic parameters are consistent with Grade II diastolic dysfunction (pseudonormalization). Elevated left atrial pressure.  2. Right ventricular systolic function is normal. The right ventricular size is normal. Tricuspid regurgitation signal is inadequate for assessing PA pressure.  3. Left atrial size was severely dilated.  4. The mitral valve is normal in structure. Trivial mitral valve regurgitation.  5. The aortic valve is tricuspid. Aortic valve regurgitation is trivial. No aortic stenosis is present.  6. Aortic dilatation noted. There is mild dilatation of the aortic root, measuring 40 mm. There is mild  dilatation of the ascending aorta, measuring 39 mm.  7. The inferior vena cava is dilated in size with >50% respiratory variability, suggesting right atrial pressure of 8 mmHg. FINDINGS  Left Ventricle: Left ventricular ejection fraction, by estimation, is 30 to 35%. The left ventricle has moderately decreased function. The left ventricle demonstrates global hypokinesis. The left ventricular internal cavity size was mildly dilated. There is severe left ventricular hypertrophy. Left ventricular diastolic parameters are consistent with Grade II diastolic dysfunction (pseudonormalization). Elevated left atrial pressure. Right Ventricle: The right ventricular size is normal. Right vetricular wall thickness was not assessed. Right ventricular systolic function is normal. Tricuspid regurgitation signal is inadequate for assessing PA pressure. Left Atrium: Left atrial size was severely dilated. Right Atrium: Right atrial size was normal in size. Pericardium: There is no evidence of pericardial effusion. Mitral Valve: The mitral valve is normal in structure. Trivial mitral valve regurgitation. Tricuspid Valve: The tricuspid valve is normal in structure. Tricuspid valve regurgitation is trivial. Aortic Valve: The aortic valve is tricuspid. Aortic valve regurgitation is trivial. No aortic stenosis is present. Pulmonic Valve: The pulmonic valve was normal in structure. Pulmonic valve regurgitation is trivial. Aorta: Aortic dilatation noted. There is mild dilatation of the aortic root, measuring 40 mm. There is mild dilatation of the ascending aorta, measuring 39 mm. Venous: The inferior vena cava is dilated in size with greater than 50% respiratory variability, suggesting right atrial pressure of 8 mmHg. IAS/Shunts: The interatrial septum was not well visualized.  LEFT VENTRICLE PLAX 2D LVIDd:         5.90 cm  Diastology LVIDs:         5.30 cm  LV e' medial:    6.53 cm/s LV PW:         2.20 cm  LV E/e' medial:  13.3 LV IVS:         2.30 cm  LV e' lateral:   5.77 cm/s LVOT diam:     2.50 cm  LV E/e' lateral: 15.0 LV SV:         76 LV SV Index:   33 LVOT Area:     4.91 cm  RIGHT VENTRICLE             IVC RV Basal diam:  3.20 cm     IVC diam: 2.40 cm RV Mid diam:    1.60 cm RV S prime:     12.20 cm/s TAPSE (M-mode): 2.1 cm LEFT ATRIUM              Index       RIGHT ATRIUM           Index LA diam:        6.00 cm  2.63 cm/m  RA Area:     17.10 cm LA Vol (A2C):   186.0 ml 81.52  ml/m RA Volume:   38.00 ml  16.65 ml/m LA Vol (A4C):   140.0 ml 61.36 ml/m LA Biplane Vol: 163.0 ml 71.44 ml/m  AORTIC VALVE LVOT Vmax:   91.40 cm/s LVOT Vmean:  63.350 cm/s LVOT VTI:    0.155 m  AORTA Ao Root diam: 4.00 cm Ao Asc diam:  3.90 cm MITRAL VALVE MV Area (PHT): 5.97 cm    SHUNTS MV Decel Time: 127 msec    Systemic VTI:  0.16 m MV E velocity: 86.60 cm/s  Systemic Diam: 2.50 cm MV A velocity: 46.70 cm/s MV E/A ratio:  1.85 Oswaldo Milian MD Electronically signed by Oswaldo Milian MD Signature Date/Time: 07/05/2020/10:46:39 AM    Final     EKG: ST PAC chronic inferior lateral T wave changes likely from LVH   ASSESSMENT AND PLAN:   1. CHF:  Known non ischemic DCM although I cannot find cath or myovue in Epic. EF 30-35% non compliant with meds. BNP elevated and CXR with CHF vascular congestion and cephalization Resume/continue Entresto 49/51 mg Aldactone coreg and lasix. Suspect he will be ready for d/c Sunday Unfortunately he was given a dose of lisinopril this am so will need to delay entresto 36 hours  2. HTN:  Improved see above   3. PAF:  maintatining NSR continue coreg and xarelto   Signed: Jenkins Rouge 07/06/2020, 11:36 AM

## 2020-07-06 NOTE — Progress Notes (Signed)
Heart Failure Stewardship Pharmacist Progress Note   PCP: Patient, [name] PCP-Cardiologist: No primary care provider on file.    HPI:  39 yo M with PMH of afib, HTN, polysubstance abuse, and CHF. He presented to the ED on 11/18 with hypertensive emergency and acute CHF. An ECHO was done that revealed LVEF 30-35% (stable from last ECHO done in 03/2019).  Current HF Medications: Furosemide 40 mg IV BID Carvedilol 25 mg BID Lisinopril 10 mg daily Hydralazine 25 mg daily  Prior to admission HF Medications: **Not taking any medications the last 8 weeks Carvedilol 25 mg BID Entresto 49/51 mg BID  Spironolactone 50 mg daily Hydralazine 100 mg BID Imdur 30 mg daily  Pertinent Lab Values: . Serum creatinine 2, BUN 23, Potassium 2.8, Sodium 137, BNP 1066, Magnesium 2.1   Vital Signs: . Weight: 242 lbs (admission weight: 242 lbs) . Blood pressure: 140-170/90s  . Heart rate: 80s  . ReDS reading: blinded group  Medication Assistance / Insurance Benefits Check: Does the patient have prescription insurance?  No  Does the patient qualify for medication assistance through manufacturers or grants?   Yes . Eligible grants and/or patient assistance programs: Jackelyn Poling, [employer] . Medication assistance applications in progress: none  . Medication assistance applications approved: none Approved medication assistance renewals will be completed by: TBD  Outpatient Pharmacy:  [facility] Is the patient willing to use [facility] at discharge? Yes Is the patient willing to transition their outpatient pharmacy to utilize a [facility] outpatient pharmacy?   Pending    Assessment: 1. Acute on chronic systolic CHF (EF 62-83%), due to NICM - uncontrolled HTN, polysubstance abuse, and medication noncompliance. NYHA class II/III symptoms. - Continue furosemide 40 mg IV BID. K replaced - Continue carvedilol 25 mg BID - On lisinopril 10 mg daily  - consider switching to ARB then hopefully Entresto. He can qualify for patient assistance to obtain medication for free from [employer] - Consider restarting spironolactone prior to discharge - may be able to stop HCTZ as this would not have benefit over spironolactone with HFrEF - Consider starting Farxiga prior to discharge. He can qualify for patient assistance to obtain medication for free from [employer]   Plan: 1) Medication changes recommended at this time: - Swith lisinopril to ARB - Start spironolactone and stop HCTZ  2) Patient assistance application(s): - None pending - Can help enroll in patient assistance foundations for Claudie Leach, Farxiga, and [employer]   3)  Education  - To be completed prior to discharge  Kerby Nora, PharmD, BCPS Heart Failure Cytogeneticist Phone [phone]

## 2020-07-06 NOTE — Progress Notes (Signed)
Heart Failure Patient Advocate Encounter  Completed application for [employer] Patient Assistance Program sent in an effort to reduce the patient's out of pocket expense for Entresto to $0.     Application completed and faxed to [phone].   [employer] patient assistance phone number for follow up is [phone].   Completed application for [employer] Patient Assistance Program sent in an effort to reduce the patient's out of pocket expense for Xarelto to $0.     Application completed and faxed to [phone].   [employer] patient assistance phone number for follow up is [phone].    Kerby Nora, PharmD, BCPS Heart Failure Stewardship Pharmacist Phone [phone]  Please check [email or web address] for unit-specific pharmacist phone numbers

## 2020-07-06 NOTE — Progress Notes (Signed)
ReDS Clip Diuretic Study Pt study # [id number]  Your patient is in the Blinded arm of the ReDS Clip Diuretic study.  Your patient has had a ReDS reading and the reading has been transmitted to the cloud.   Thank You   The research team   Kerby Nora, PharmD, BCPS Heart Failure Stewardship Pharmacist Phone [phone]  Please check [email or web address] for unit-specific pharmacist phone numbers

## 2020-07-06 NOTE — Plan of Care (Signed)
  Problem: Education: Goal: Knowledge of General Education information will improve Description: Including pain rating scale, medication(s)/side effects and non-pharmacologic comfort measures Outcome: Progressing   Problem: Clinical Measurements: Goal: Ability to maintain clinical measurements within normal limits will improve Outcome: Progressing Goal: Will remain free from infection Outcome: Progressing Goal: Cardiovascular complication will be avoided Outcome: Progressing   Problem: Clinical Measurements: Goal: Will remain free from infection Outcome: Progressing   Problem: Clinical Measurements: Goal: Cardiovascular complication will be avoided Outcome: Progressing   Problem: Nutrition: Goal: Adequate nutrition will be maintained Outcome: Progressing   Problem: Pain Managment: Goal: General experience of comfort will improve Outcome: Progressing   Problem: Safety: Goal: Ability to remain free from injury will improve Outcome: Progressing

## 2020-07-06 NOTE — Care Management (Addendum)
1258 07-06-20 Patient presented with shortness of breath. 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 Case Manager educated patient regarding diet -sodium intake, and fluids. Patient has a scale at home that he uses. Patient does not have a primary care provider and he is agreeable to the Case Manager scheduling an appointment at the [facility] and placing the information on the AVS. MATCH-reinstated: is completed and the Case Manager will give the patient the [facility] [address]. Case Manager will continue to follow for additional transition of care needs. Graves-Bigelow, Ocie Cornfield, RN, BSN Case Manager

## 2020-07-06 NOTE — Progress Notes (Signed)
PROGRESS NOTE    [name]  HWE:[id number] DOB: 09-16-1980 DOA: 07/05/2020 PCP: Patient, [name]   Brief Narrative:  Gerald [name] is a 39 y.o. male with medical history significant for hypertension, substance abuse in remission, history of atrial fibrillation status post cardioversion, medication noncompliance, and cardiomyopathy with EF 30 to 35%, now presenting to the emergency department for evaluation of shortness of breath.  The patient reports that he noticed the insidious development of shortness of breath approximately 1 week ago, has also been experiencing worsening orthopnea over the same interval.  He has had some mild pedal edema and a cough with pinkish sputum production.  Denies any fevers or chills.  He has had some chest discomfort that has been fairly constant for the past week.  Upon arrival to the ED, patient is found to be afebrile, saturating low 90s on room air, tachypneic, slightly tachycardic, and severely hypertensive.  EKG features sinus tachycardia with rate 103, PACs, and LVH.  Chest x-ray is concerning for CHF with cardiomegaly and edema.  Chemistry panel notable for potassium 2.7 and creatinine 1.61.  CBC with a microcytic anemia.  BNP is elevated to 1067 and high-sensitivity troponin is 693.  Patient was started on nitroglycerin infusion and treated with 40 mg IV Lasix, fentanyl, and oral and IV potassium.   Since admission patient symptoms have rapidly resolved his blood pressure has been markedly elevated despite resuming home medications and adding additional medications his blood pressure overnight remained in the 893Y to 101B systolic, cardiology has been consulted for further evaluation and treatment given his severely resistant hypertension despite multiple medications.   Assessment & Plan:   Principal Problem:   Acute on chronic systolic CHF (congestive heart failure) ([name]) Active Problems:   Acute systolic congestive heart failure ([name])    Hypertensive urgency   PAF (paroxysmal atrial fibrillation) ([name])   Elevated troponin   Microcytic anemia   Mild renal insufficiency   Hypokalemia   Acute hypertensive emergency in the setting of medication noncompliance, POA Concurrent flash pulmonary edema without hypoxia -Secondary to medication noncompliance -Patient admits to skipping, if not discontinuing, home medications as well as previous history of cocaine abuse -Cardiology consulted, appreciate insight recommendations given complex history and multiple conflicting medication lists -Continue Entresto (after 36-hour lisinopril washout), spironolactone, carvedilol, Lasix -Nitroglycerin drip discontinued per protocol  Acute on chronic systolic CHF  - Secondary to hypertensive emergency as above  - Reports 1 week of worsening orthopnea and dyspnea with exertion - EF was 30-35% in August 2020 - Continue diuresis Lasix 40 IV twice daily, follow hypokalemia as below -We will need to follow-up for core measures, patient not on ACE, ARB, or diuretic but is on spironolactone and beta-blocker per med review  Elevated troponin in the setting of hypertensive emergency, patient is without chest pain  - Supply demand mismatch in the setting of hypertensive emergency and likely VQ mismatch in the setting of pleural edema and heart failure exacerbation - Continue cardiac monitoring -Echocardiogram ordered at admission   History of persistent A. fib, status post cardioversion POA   - Patient has very poor follow-up but indicates he is remained in sinus rhythm since cardioversion last year, unlikely that he would be able to confirm this without routine monitoring - Currently in sinus rhythm - Continue home anticoagulation until we can confirm patient's chronic rhythm; certainly worth discussing with PCP or cardiology in the outpatient setting if patient truly remains in sinus rhythm with  low burden of A. fib  Profound hypokalemia  -  Potassium minimally improving in the setting of ongoing diuresis - Repleted this am with additional 80MEQ Lab Results  Component Value Date   K 2.8 (L) 07/06/2020   CKD 3 a   - Baseline around 1.3 - Monitor renal function in the setting of diuresis Lab Results  Component Value Date   CREATININE 2.00 (H) 07/06/2020   CREATININE 1.60 (H) 07/05/2020   CREATININE 1.61 (H) 07/05/2020    Chronic microcytic anemia, likely in the setting of chronic disease  - Near previously noted(albeit distant) baseline, patient denies any recent bleeding or easy bruising or dark or maroon stool  DVT prophylaxis: Lovenox  Code Status: Full  Family Communication:  None available, discussed with patient   Status is: Inpatient  Dispo: The patient is from: Home              Anticipated d/c is to: Home Home              Anticipated d/c date is: 24 to 48 hours pending clinical course              Patient currently not medically stable for discharge given ongoing need for nitro drip, close monitoring and telemetry in the setting of hypertensive emergency with flash pulmonary edema versus heart failure and acute respiratory failure without hypoxia  Consultants:   Cardiology  Procedures:   None  Antimicrobials:  None indicated  Subjective: No acute issues or events overnight, denies nausea, vomiting, chest pain, shortness of breath, fevers, chills  Objective: Vitals:   07/05/20 2109 07/05/20 2354 07/06/20 0438 07/06/20 0806  BP: (!) 138/113 (!) 144/94 (!) 153/94   Pulse:  86 80 83  Resp:    20  Temp:  99.2 F (37.3 C) 98.4 F (36.9 C) 98.2 F (36.8 C)  TempSrc:  Oral Oral Oral  SpO2:  95% 97% 99%  Weight:   109.9 kg   Height:        Intake/Output Summary (Last 24 hours) at 07/06/2020 0816 Last data filed at 07/06/2020 0807 Gross per 24 hour  Intake 2533.96 ml  Output 3375 ml  Net -841.04 ml   Filed Weights   07/05/20 0128 07/05/20 1150 07/06/20 0438  Weight: 106.6 kg 109.9 kg  109.9 kg    Examination:  General:  Pleasantly resting in bed, No acute distress. HEENT:  Normocephalic atraumatic.  Sclerae nonicteric, noninjected.  Extraocular movements intact bilaterally. Neck:  Without mass or deformity.  Trachea is midline. Lungs:  Clear to auscultate bilaterally without rhonchi, wheeze, or rales. Heart:  Regular rate and rhythm.  Without murmurs, rubs, or gallops. Abdomen:  Soft, nontender, nondistended.  Without guarding or rebound. Extremities: Without cyanosis, clubbing, edema, or obvious deformity. Vascular:  Dorsalis pedis and posterior tibial pulses palpable bilaterally. Skin:  Warm and dry, no erythema, no ulcerations.    Data Reviewed: I have personally reviewed following labs and imaging studies  CBC: Recent Labs  Lab 07/05/20 0204 07/06/20 0306  WBC 7.9 8.0  NEUTROABS 6.0  --   HGB 9.5* 9.2*  HCT 31.6* 29.6*  MCV 68.7* 66.8*  PLT 251 003   Basic Metabolic Panel: Recent Labs  Lab 07/05/20 0204 07/05/20 1210 07/06/20 0306  NA 140 139 137  K 2.7* 2.8* 2.8*  CL 107 102 101  CO2 25 25 27   GLUCOSE 112* 107* 110*  BUN 22* 19 23*  CREATININE 1.61* 1.60* 2.00*  CALCIUM 8.2* 8.7* 8.2*  MG  --   --  2.1   GFR: Estimated Creatinine Clearance: 63.5 mL/min (A) (by C-G formula based on SCr of 2 mg/dL (H)). Liver Function Tests: Recent Labs  Lab 07/05/20 0204  AST 24  ALT 34  ALKPHOS 77  BILITOT 0.8  PROT 5.7*  ALBUMIN 2.6*   No results for input(s): LIPASE, AMYLASE in the last 168 hours. No results for input(s): AMMONIA in the last 168 hours. Coagulation Profile: No results for input(s): INR, PROTIME in the last 168 hours. Cardiac Enzymes: No results for input(s): CKTOTAL, CKMB, CKMBINDEX, TROPONINI in the last 168 hours. BNP (last 3 results) No results for input(s): PROBNP in the last 8760 hours. HbA1C: No results for input(s): HGBA1C in the last 72 hours. CBG: No results for input(s): GLUCAP in the last 168 hours. Lipid  Profile: No results for input(s): CHOL, HDL, LDLCALC, TRIG, CHOLHDL, LDLDIRECT in the last 72 hours. Thyroid Function Tests: No results for input(s): TSH, T4TOTAL, FREET4, T3FREE, THYROIDAB in the last 72 hours. Anemia Panel: No results for input(s): VITAMINB12, FOLATE, FERRITIN, TIBC, IRON, RETICCTPCT in the last 72 hours. Sepsis Labs: No results for input(s): PROCALCITON, LATICACIDVEN in the last 168 hours.  Recent Results (from the past 240 hour(s))  Respiratory Panel by RT PCR (Flu A&B, Covid) - Nasopharyngeal Swab     Status: None   Collection Time: 07/05/20  2:04 AM   Specimen: Nasopharyngeal Swab  Result Value Ref Range Status   SARS Coronavirus 2 by RT PCR NEGATIVE NEGATIVE Final    Comment: (NOTE) SARS-CoV-2 target nucleic acids are NOT DETECTED.  The SARS-CoV-2 RNA is generally detectable in upper respiratoy specimens during the acute phase of infection. The lowest concentration of SARS-CoV-2 viral copies this assay can detect is 131 copies/mL. A negative result does not preclude SARS-Cov-2 infection and should not be used as the sole basis for treatment or other patient management decisions. A negative result may occur with  improper specimen collection/handling, submission of specimen other than nasopharyngeal swab, presence of viral mutation(s) within the areas targeted by this assay, and inadequate number of viral copies (<131 copies/mL). A negative result must be combined with clinical observations, patient history, and epidemiological information. The expected result is Negative.  Fact Sheet for Patients:  PinkCheek.be  Fact Sheet for Healthcare Providers:  GravelBags.it  This test is no t yet approved or cleared by the [place] FDA and  has been authorized for detection and/or diagnosis of SARS-CoV-2 by FDA under an Emergency Use Authorization (EUA). This EUA will remain  in effect (meaning this  test can be used) for the duration of the COVID-19 declaration under Section 564(b)(1) of the Act, 21 U.S.C. section 360bbb-3(b)(1), unless the authorization is terminated or revoked sooner.     Influenza A by PCR NEGATIVE NEGATIVE Final   Influenza B by PCR NEGATIVE NEGATIVE Final    Comment: (NOTE) The Xpert Xpress SARS-CoV-2/FLU/RSV assay is intended as an aid in  the diagnosis of influenza from Nasopharyngeal swab specimens and  should not be used as a sole basis for treatment. Nasal washings and  aspirates are unacceptable for Xpert Xpress SARS-CoV-2/FLU/RSV  testing.  Fact Sheet for Patients: PinkCheek.be  Fact Sheet for Healthcare Providers: GravelBags.it  This test is not yet approved or cleared by the [place] FDA and  has been authorized for detection and/or diagnosis of SARS-CoV-2 by  FDA under an Emergency Use Authorization (EUA). This EUA will remain  in effect (meaning this test can  be used) for the duration of the  Covid-19 declaration under Section 564(b)(1) of the Act, 21  U.S.C. section 360bbb-3(b)(1), unless the authorization is  terminated or revoked. Performed at [facility], [place] [address], [place], [place] [address]          Radiology Studies: DG Chest Port 1 View  Result Date: 07/05/2020 CLINICAL DATA:  Cough, shortness of breath, chest pain with inspiration EXAM: PORTABLE CHEST 1 VIEW COMPARISON:  Radiograph 04/05/2019 FINDINGS: Diffuse interstitial and patchy airspace opacities are present throughout both lungs, most prominently in the right mid to lower lung with chronic cardiomegaly, vascular congestion and cephalization as well as fissural thickening and few peripheral septal lines. No pneumothorax or visible effusion. Remaining cardiomediastinal contours are unremarkable. No acute osseous or soft tissue abnormality. Telemetry leads overlie the chest. IMPRESSION: 1. Findings  consistent with CHF/volume overload with cardiomegaly, vascular congestion and cephalization as well as mixed hazy interstitial and fluffy airspace opacities suggestive of interstitial and alveolar edema though infection cannot be fully excluded in the appropriate clinical context. Electronically Signed   By: Lovena Le M.D.   On: 07/05/2020 03:04   ECHOCARDIOGRAM COMPLETE  Result Date: 07/05/2020    ECHOCARDIOGRAM REPORT   Patient Name:   Gerald [name] Date of Exam: 07/05/2020 Medical Rec #:  [id number]        Height:       72.0 in Accession #:    [id number]       Weight:       235.0 lb Date of Birth:  [birth year]        BSA:          2.282 m Patient Age:    76 years         BP:           177/128 mmHg Patient Gender: M                HR:           88 bpm. Exam Location:  Inpatient Procedure: 2D Echo, Cardiac Doppler and Color Doppler Indications:    I50.23 Acute on chronic systolic (congestive) heart failure                 Elevated Troponin.  History:        Patient has prior history of Echocardiogram examinations, most                 recent 04/04/2019. Risk Factors:Hypertension. Polysubstance                 abuse. Arrhythmia.  Sonographer:    Jonelle Sidle Dance Referring Phys: [id number] [place]  1. Left ventricular ejection fraction, by estimation, is 30 to 35%. The left ventricle has moderately decreased function. The left ventricle demonstrates global hypokinesis. The left ventricular internal cavity size was mildly dilated. There is severe left ventricular hypertrophy. Left ventricular diastolic parameters are consistent with [name] diastolic dysfunction (pseudonormalization). Elevated left atrial pressure.  2. Right ventricular systolic function is normal. The right ventricular size is normal. Tricuspid regurgitation signal is inadequate for assessing PA pressure.  3. Left atrial size was severely dilated.  4. The mitral valve is normal in structure. Trivial mitral valve regurgitation.   5. The aortic valve is tricuspid. Aortic valve regurgitation is trivial. No aortic stenosis is present.  6. Aortic dilatation noted. There is mild dilatation of the aortic root, measuring 40 mm. There is mild dilatation of  the ascending aorta, measuring 39 mm.  7. The inferior vena cava is dilated in size with >50% respiratory variability, suggesting right atrial pressure of 8 mmHg. FINDINGS  Left Ventricle: Left ventricular ejection fraction, by estimation, is 30 to 35%. The left ventricle has moderately decreased function. The left ventricle demonstrates global hypokinesis. The left ventricular internal cavity size was mildly dilated. There is severe left ventricular hypertrophy. Left ventricular diastolic parameters are consistent with [name] diastolic dysfunction (pseudonormalization). Elevated left atrial pressure. Right Ventricle: The right ventricular size is normal. Right vetricular wall thickness was not assessed. Right ventricular systolic function is normal. Tricuspid regurgitation signal is inadequate for assessing PA pressure. Left Atrium: Left atrial size was severely dilated. Right Atrium: Right atrial size was normal in size. Pericardium: There is no evidence of pericardial effusion. Mitral Valve: The mitral valve is normal in structure. Trivial mitral valve regurgitation. Tricuspid Valve: The tricuspid valve is normal in structure. Tricuspid valve regurgitation is trivial. Aortic Valve: The aortic valve is tricuspid. Aortic valve regurgitation is trivial. No aortic stenosis is present. Pulmonic Valve: The pulmonic valve was normal in structure. Pulmonic valve regurgitation is trivial. Aorta: Aortic dilatation noted. There is mild dilatation of the aortic root, measuring 40 mm. There is mild dilatation of the ascending aorta, measuring 39 mm. Venous: The inferior vena cava is dilated in size with greater than 50% respiratory variability, suggesting right atrial pressure of 8 mmHg. IAS/Shunts: The  interatrial septum was not well visualized.  LEFT VENTRICLE PLAX 2D LVIDd:         5.90 cm  Diastology LVIDs:         5.30 cm  LV e' medial:    6.53 cm/s LV PW:         2.20 cm  LV E/e' medial:  13.3 LV IVS:        2.30 cm  LV e' lateral:   5.77 cm/s LVOT diam:     2.50 cm  LV E/e' lateral: 15.0 LV SV:         76 LV SV Index:   33 LVOT Area:     4.91 cm  RIGHT VENTRICLE             IVC RV Basal diam:  3.20 cm     IVC diam: 2.40 cm RV Mid diam:    1.60 cm RV S prime:     12.20 cm/s TAPSE (M-mode): 2.1 cm LEFT ATRIUM              Index       RIGHT ATRIUM           Index LA diam:        6.00 cm  2.63 cm/m  RA Area:     17.10 cm LA Vol (A2C):   186.0 ml 81.52 ml/m RA Volume:   38.00 ml  16.65 ml/m LA Vol (A4C):   140.0 ml 61.36 ml/m LA Biplane Vol: 163.0 ml 71.44 ml/m  AORTIC VALVE LVOT Vmax:   91.40 cm/s LVOT Vmean:  63.350 cm/s LVOT VTI:    0.155 m  AORTA Ao Root diam: 4.00 cm Ao Asc diam:  3.90 cm MITRAL VALVE MV Area (PHT): 5.97 cm    SHUNTS MV Decel Time: 127 msec    Systemic VTI:  0.16 m MV E velocity: 86.60 cm/s  Systemic Diam: 2.50 cm MV A velocity: 46.70 cm/s MV E/A ratio:  1.85 Oswaldo Milian MD Electronically signed by Oswaldo Milian MD Signature Date/Time:  07/05/2020/10:46:39 AM    Final         Scheduled Meds: . amLODipine  10 mg Oral Daily  . carvedilol  25 mg Oral BID WC  . furosemide  40 mg Intravenous BID  . hydrALAZINE  100 mg Oral BID  . hydrochlorothiazide  25 mg Oral Daily  . lisinopril  10 mg Oral Daily  . potassium chloride SA  40 mEq Oral Q6H  . rivaroxaban  20 mg Oral Q supper  . sodium chloride flush  3 mL Intravenous Q12H   Continuous Infusions: . sodium chloride Stopped (07/06/20 0530)     LOS: 1 day   Time spent: 72min  Mercer Stallworth C Tawni Melkonian, DO [facility]  If 7PM-7AM, please contact night-coverage [email or web address]  07/06/2020, 8:16 AM

## 2020-07-06 NOTE — Progress Notes (Signed)
Renal artery duplex completed. Refer to "CV Proc" under chart review to view preliminary results.  07/06/2020 2:20 PM Kelby Aline., MHA, RVT, RDCS, RDMS

## 2020-07-06 NOTE — Plan of Care (Signed)
  Problem: Education: Goal: Knowledge of General Education information will improve Description: Including pain rating scale, medication(s)/side effects and non-pharmacologic comfort measures 07/06/2020 1003 by Rosemary Holms, RN Outcome: Progressing 07/06/2020 1003 by Rosemary Holms, RN Outcome: Progressing   Problem: Coping: Goal: Level of anxiety will decrease 07/06/2020 1003 by Rosemary Holms, RN Outcome: Progressing 07/06/2020 1003 by Rosemary Holms, RN Outcome: Progressing   Problem: Pain Managment: Goal: General experience of comfort will improve 07/06/2020 1003 by Rosemary Holms, RN Outcome: Progressing 07/06/2020 1003 by Rosemary Holms, RN Outcome: Progressing     Problem: Clinical Measurements: Goal: Ability to maintain clinical measurements within normal limits will improve Outcome: Progressing

## 2020-07-07 LAB — BASIC METABOLIC PANEL
Anion gap: 10 (ref 5–15)
BUN: 28 mg/dL — ABNORMAL HIGH (ref 6–20)
CO2: 29 mmol/L (ref 22–32)
Calcium: 8.5 mg/dL — ABNORMAL LOW (ref 8.9–10.3)
Chloride: 100 mmol/L (ref 98–111)
Creatinine, Ser: 2.12 mg/dL — ABNORMAL HIGH (ref 0.61–1.24)
GFR, Estimated: 40 mL/min — ABNORMAL LOW (ref >60)
Glucose, Bld: 101 mg/dL — ABNORMAL HIGH (ref 70–99)
Potassium: 3.1 mmol/L — ABNORMAL LOW (ref 3.5–5.1)
Sodium: 139 mmol/L (ref 135–145)

## 2020-07-07 LAB — CBC
HCT: 33.4 % — ABNORMAL LOW (ref 39.0–52.0)
Hemoglobin: 10.1 g/dL — ABNORMAL LOW (ref 13.0–17.0)
MCH: 20.1 pg — ABNORMAL LOW (ref 26.0–34.0)
MCHC: 30.2 g/dL (ref 30.0–36.0)
MCV: 66.5 fL — ABNORMAL LOW (ref 80.0–100.0)
Platelets: 285 10*3/uL (ref 150–400)
RBC: 5.02 MIL/uL (ref 4.22–5.81)
RDW: 16.7 % — ABNORMAL HIGH (ref 11.5–15.5)
WBC: 6.5 10*3/uL (ref 4.0–10.5)
nRBC: 0 % (ref 0.0–0.2)

## 2020-07-07 MED ORDER — SPIRONOLACTONE 25 MG PO TABS
25.0000 mg | ORAL_TABLET | Freq: Every day | ORAL | 0 refills | Status: DC
Start: 2020-07-08 — End: 2021-01-23

## 2020-07-07 MED ORDER — ATORVASTATIN CALCIUM 40 MG PO TABS
40.0000 mg | ORAL_TABLET | Freq: Every day | ORAL | 1 refills | Status: DC
Start: 2020-07-07 — End: 2021-01-23

## 2020-07-07 MED ORDER — FUROSEMIDE 20 MG PO TABS
20.0000 mg | ORAL_TABLET | Freq: Every day | ORAL | 0 refills | Status: DC
Start: 2020-07-08 — End: 2021-01-23

## 2020-07-07 MED ORDER — HYDRALAZINE HCL 50 MG PO TABS
100.0000 mg | ORAL_TABLET | Freq: Two times a day (BID) | ORAL | Status: DC
  Administered 2020-07-07: 100 mg via ORAL
  Filled 2020-07-07: qty 2

## 2020-07-07 MED ORDER — FUROSEMIDE 20 MG PO TABS: 20.0000 mg | ORAL_TABLET | Freq: Two times a day (BID) | ORAL | 1 refills | Status: DC

## 2020-07-07 MED ORDER — FUROSEMIDE 20 MG PO TABS: 20.0000 mg | ORAL_TABLET | Freq: Every day | ORAL | Status: DC

## 2020-07-07 MED ORDER — HYDRALAZINE HCL 100 MG PO TABS
100.0000 mg | ORAL_TABLET | Freq: Two times a day (BID) | ORAL | 1 refills | Status: DC
Start: 2020-07-07 — End: 2021-01-23

## 2020-07-07 MED ORDER — POTASSIUM CHLORIDE CRYS ER 20 MEQ PO TBCR
40.0000 meq | EXTENDED_RELEASE_TABLET | ORAL | Status: DC
  Administered 2020-07-07: 40 meq via ORAL
  Filled 2020-07-07: qty 2

## 2020-07-07 MED ORDER — CARVEDILOL 25 MG PO TABS: 25.0000 mg | ORAL_TABLET | Freq: Two times a day (BID) | ORAL | 1 refills | Status: DC

## 2020-07-07 MED ORDER — SACUBITRIL-VALSARTAN 49-51 MG PO TABS
1.0000 | ORAL_TABLET | Freq: Two times a day (BID) | ORAL | 0 refills | Status: DC
Start: 2020-07-07 — End: 2020-07-07

## 2020-07-07 MED ORDER — RIVAROXABAN 20 MG PO TABS
20.0000 mg | ORAL_TABLET | Freq: Every day | ORAL | 0 refills | Status: DC
Start: 2020-07-07 — End: 2021-01-23

## 2020-07-07 NOTE — Progress Notes (Signed)
D/C instructions given. Pt was receptive to instructions given. Pt verbalized understanding of instructions given. Able to discuss food option based on heart healthy diet and able to discuss heart failure. Using teach back method pt verbalized when to call the DR. Hand wrote cardiology information on the AVS. Pt states he is established with this practice.

## 2020-07-07 NOTE — Discharge Instructions (Signed)

## 2020-07-07 NOTE — Discharge Summary (Signed)
Physician Discharge Summary  Gerald [name] VQM:[id number] DOB: December 08, 1980 DOA: 07/05/2020  PCP: Patient, [name]  Admit date: 07/05/2020 Discharge date: 07/07/2020  Admitted From: Home Disposition: Home  Recommendations for Outpatient Follow-up:  1. Follow up with PCP in 1-2 weeks 2. Follow-up with cardiology as scheduled  Home Health: None Equipment/Devices: None  Discharge Condition: Stable CODE STATUS: Full Diet recommendation: Low-salt, low-fat diet  Brief/Interim Summary: Gerald Rinne IIis a 39 y.o.malewithwith medical history significant forhypertension, substance abuse in remission, history of atrial fibrillation status post cardioversion, medication noncompliance, and cardiomyopathy with EF 30 to 35%, now presenting to the emergency department for evaluation of shortness of breath. The patient reports that he noticed the insidious development of shortness of breath approximately 1 week ago, has also been experiencing worsening orthopnea over the same interval. He has had some mild pedal edema and a cough with pinkish sputum production. Denies any fevers or chills. He has had some chest discomfort that has been fairly constant for the past week. Upon arrival to the ED, patient is found to be afebrile, saturating low 90s on room air, tachypneic, slightly tachycardic, and severely hypertensive.  Patient met as above with acute hypertensive emergency in the setting of uncontrolled blood pressure, acute systolic dysfunction heart failure with respiratory distress and profound hypokalemia.  Patient was initiated on core measures given his reduced ejection fraction heart failure noted on echo here in house.  Patient's blood pressure is markedly well controlled now currently on carvedilol, furosemide, hydralazine, spironolactone.  Initially patient was planned to start Entresto here in house however given his minimally elevated creatinine this has been put on hold, he will need  to follow in the outpatient setting with cardiology prior to initiating Entresto to ensure he has no kidney dysfunction as well as to ensure tolerance given his marked improvement in blood pressure on current regiment over the past 48 hours.  Follow-up outpatient with PCP and cardiology as scheduled ideally in the next 1 to 2 weeks for repeat labs and evaluation.  Lengthy discussion at bedside daily with patient about need for daily weights, morning blood pressure evaluations and strict recordkeeping for PCP and cardiology to further assist in medical management and titration of his new medical regimen.  We discussed the need for close monitoring and need for medication compliance given his known history of noncompliance he is at very high risk for decompensation worsening heart failure and increased morbidity mortality should he continue to be noncompliant with regimen and follow-up.  Patient at this time otherwise stable and agreeable for follow-up outpatient.  Of note given patient's young age and advanced hypertension a renal artery ultrasound was taken and was unremarkable.  Patient may benefit from further evaluation and imaging for secondary causes of hypertension.  Discharge Diagnoses:  Principal Problem:   Acute on chronic systolic CHF (congestive heart failure) ([name]) Active Problems:   Acute systolic congestive heart failure ([name])   Hypertensive urgency   PAF (paroxysmal atrial fibrillation) ([name])   Elevated troponin   Microcytic anemia   Mild renal insufficiency   Hypokalemia    Discharge Instructions  Discharge Instructions    (HEART FAILURE PATIENTS) Call MD:  Anytime you have any of the following symptoms: 1) 3 pound weight gain in 24 hours or 5 pounds in 1 week 2) shortness of breath, with or without a dry hacking cough 3) swelling in the hands, feet or stomach 4) if you have to sleep on extra pillows at night  in order to breathe.   Complete by: As directed    Call MD for:   difficulty breathing, headache or visual disturbances   Complete by: As directed    Call MD for:  extreme fatigue   Complete by: As directed    Call MD for:  persistant dizziness or light-headedness   Complete by: As directed    Diet - low sodium heart healthy   Complete by: As directed    Increase activity slowly   Complete by: As directed      Allergies as of 07/07/2020   No Known Allergies     Medication List    STOP taking these medications   aspirin EC 81 MG tablet   cloNIDine 0.1 MG tablet Commonly known as: CATAPRES   hydrochlorothiazide 25 MG tablet Commonly known as: HYDRODIURIL   ibuprofen 200 MG tablet Commonly known as: ADVIL   isosorbide mononitrate 30 MG 24 hr tablet Commonly known as: IMDUR   sacubitril-valsartan 49-51 MG Commonly known as: ENTRESTO     TAKE these medications   atorvastatin 40 MG tablet Commonly known as: LIPITOR Take 1 tablet (40 mg total) by mouth daily.   carvedilol 25 MG tablet Commonly known as: COREG Take 1 tablet (25 mg total) by mouth 2 (two) times daily with a meal.   furosemide 20 MG tablet Commonly known as: LASIX Take 1 tablet (20 mg total) by mouth daily. Start taking on: July 08, 2020   hydrALAZINE 100 MG tablet Commonly known as: APRESOLINE Take 1 tablet (100 mg total) by mouth 2 (two) times daily.   potassium chloride SA 20 MEQ tablet Commonly known as: KLOR-CON Take 1 tablet (20 mEq total) by mouth daily.   rivaroxaban 20 MG Tabs tablet Commonly known as: XARELTO Take 1 tablet (20 mg total) by mouth daily with supper.   spironolactone 25 MG tablet Commonly known as: ALDACTONE Take 1 tablet (25 mg total) by mouth daily. Start taking on: July 08, 2020 What changed:   medication strength  how much to take       [place] Follow up on 08/03/2020.   Why: @ 9:30 am with Dr. Gillis [name] hospital follow up appointment. If you need to  reschedule- please call the oofice. Onsite Pharmacy-medications range from $4.00-10.00.  Contact information: [address] [place] [address] [phone]             No Known Allergies  Consultations:  Cardiology   Procedures/Studies: DG Chest Port 1 View  Result Date: 07/05/2020 CLINICAL DATA:  Cough, shortness of breath, chest pain with inspiration EXAM: PORTABLE CHEST 1 VIEW COMPARISON:  Radiograph 04/05/2019 FINDINGS: Diffuse interstitial and patchy airspace opacities are present throughout both lungs, most prominently in the right mid to lower lung with chronic cardiomegaly, vascular congestion and cephalization as well as fissural thickening and few peripheral septal lines. No pneumothorax or visible effusion. Remaining cardiomediastinal contours are unremarkable. No acute osseous or soft tissue abnormality. Telemetry leads overlie the chest. IMPRESSION: 1. Findings consistent with CHF/volume overload with cardiomegaly, vascular congestion and cephalization as well as mixed hazy interstitial and fluffy airspace opacities suggestive of interstitial and alveolar edema though infection cannot be fully excluded in the appropriate clinical context. Electronically Signed   By: Lovena Le M.D.   On: 07/05/2020 03:04   ECHOCARDIOGRAM COMPLETE  Result Date: 07/05/2020    ECHOCARDIOGRAM REPORT   Patient Name:   BIRNEY BELSHE  [name] Date of Exam: 07/05/2020 Medical Rec #:  [id number]        Height:       72.0 in Accession #:    [id number]       Weight:       235.0 lb Date of Birth:  [birth year]        BSA:          2.282 m Patient Age:    27 years         BP:           177/128 mmHg Patient Gender: M                HR:           88 bpm. Exam Location:  Inpatient Procedure: 2D Echo, Cardiac Doppler and Color Doppler Indications:    I50.23 Acute on chronic systolic (congestive) heart failure                 Elevated Troponin.  History:        Patient has prior history of  Echocardiogram examinations, most                 recent 04/04/2019. Risk Factors:Hypertension. Polysubstance                 abuse. Arrhythmia.  Sonographer:    Jonelle Sidle Dance Referring Phys: [id number] [place]  1. Left ventricular ejection fraction, by estimation, is 30 to 35%. The left ventricle has moderately decreased function. The left ventricle demonstrates global hypokinesis. The left ventricular internal cavity size was mildly dilated. There is severe left ventricular hypertrophy. Left ventricular diastolic parameters are consistent with [name] diastolic dysfunction (pseudonormalization). Elevated left atrial pressure.  2. Right ventricular systolic function is normal. The right ventricular size is normal. Tricuspid regurgitation signal is inadequate for assessing PA pressure.  3. Left atrial size was severely dilated.  4. The mitral valve is normal in structure. Trivial mitral valve regurgitation.  5. The aortic valve is tricuspid. Aortic valve regurgitation is trivial. No aortic stenosis is present.  6. Aortic dilatation noted. There is mild dilatation of the aortic root, measuring 40 mm. There is mild dilatation of the ascending aorta, measuring 39 mm.  7. The inferior vena cava is dilated in size with >50% respiratory variability, suggesting right atrial pressure of 8 mmHg. FINDINGS  Left Ventricle: Left ventricular ejection fraction, by estimation, is 30 to 35%. The left ventricle has moderately decreased function. The left ventricle demonstrates global hypokinesis. The left ventricular internal cavity size was mildly dilated. There is severe left ventricular hypertrophy. Left ventricular diastolic parameters are consistent with [name] diastolic dysfunction (pseudonormalization). Elevated left atrial pressure. Right Ventricle: The right ventricular size is normal. Right vetricular wall thickness was not assessed. Right ventricular systolic function is normal. Tricuspid regurgitation  signal is inadequate for assessing PA pressure. Left Atrium: Left atrial size was severely dilated. Right Atrium: Right atrial size was normal in size. Pericardium: There is no evidence of pericardial effusion. Mitral Valve: The mitral valve is normal in structure. Trivial mitral valve regurgitation. Tricuspid Valve: The tricuspid valve is normal in structure. Tricuspid valve regurgitation is trivial. Aortic Valve: The aortic valve is tricuspid. Aortic valve regurgitation is trivial. No aortic stenosis is present. Pulmonic Valve: The pulmonic valve was normal in structure. Pulmonic valve regurgitation is trivial. Aorta: Aortic dilatation noted. There is mild dilatation of the aortic root, measuring 40 mm. There is mild dilatation of  the ascending aorta, measuring 39 mm. Venous: The inferior vena cava is dilated in size with greater than 50% respiratory variability, suggesting right atrial pressure of 8 mmHg. IAS/Shunts: The interatrial septum was not well visualized.  LEFT VENTRICLE PLAX 2D LVIDd:         5.90 cm  Diastology LVIDs:         5.30 cm  LV e' medial:    6.53 cm/s LV PW:         2.20 cm  LV E/e' medial:  13.3 LV IVS:        2.30 cm  LV e' lateral:   5.77 cm/s LVOT diam:     2.50 cm  LV E/e' lateral: 15.0 LV SV:         76 LV SV Index:   33 LVOT Area:     4.91 cm  RIGHT VENTRICLE             IVC RV Basal diam:  3.20 cm     IVC diam: 2.40 cm RV Mid diam:    1.60 cm RV S prime:     12.20 cm/s TAPSE (M-mode): 2.1 cm LEFT ATRIUM              Index       RIGHT ATRIUM           Index LA diam:        6.00 cm  2.63 cm/m  RA Area:     17.10 cm LA Vol (A2C):   186.0 ml 81.52 ml/m RA Volume:   38.00 ml  16.65 ml/m LA Vol (A4C):   140.0 ml 61.36 ml/m LA Biplane Vol: 163.0 ml 71.44 ml/m  AORTIC VALVE LVOT Vmax:   91.40 cm/s LVOT Vmean:  63.350 cm/s LVOT VTI:    0.155 m  AORTA Ao Root diam: 4.00 cm Ao Asc diam:  3.90 cm MITRAL VALVE MV Area (PHT): 5.97 cm    SHUNTS MV Decel Time: 127 msec    Systemic VTI:  0.16  m MV E velocity: 86.60 cm/s  Systemic Diam: 2.50 cm MV A velocity: 46.70 cm/s MV E/A ratio:  1.85 Oswaldo Milian MD Electronically signed by Oswaldo Milian MD Signature Date/Time: 07/05/2020/10:46:39 AM    Final    VAS US RENAL ARTERY DUPLEX  Result Date: 07/06/2020 ABDOMINAL VISCERAL Indications: Resistant hypertension Other Factors: CHF. Polysubstance abuse. Comparison Study: 04/06/2019- no high grade renal artery stenosis Performing Technologist: Maudry Mayhew MHA, RDMS, RVT, RDCS  Examination Guidelines: A complete evaluation includes B-mode imaging, spectral Doppler, color Doppler, and power Doppler as needed of all accessible portions of each vessel. Bilateral testing is considered an integral part of a complete examination. Limited examinations for reoccurring indications may be performed as noted.  Duplex Findings: +--------------------+--------+--------+------+--------+ Mesenteric          PSV cm/sEDV cm/sPlaqueComments +--------------------+--------+--------+------+--------+ Aorta Prox             82      18                  +--------------------+--------+--------+------+--------+ Celiac Artery Origin  112                          +--------------------+--------+--------+------+--------+ SMA Proximal          164      23                  +--------------------+--------+--------+------+--------+    +------------------+--------+--------+-------+  Right Renal ArteryPSV cm/sEDV cm/sComment +------------------+--------+--------+-------+ Origin               85      26           +------------------+--------+--------+-------+ Proximal             46      16           +------------------+--------+--------+-------+ Mid                  54      13           +------------------+--------+--------+-------+ Distal               40      14           +------------------+--------+--------+-------+ +-----------------+--------+--------+-------+ Left Renal  ArteryPSV cm/sEDV cm/sComment +-----------------+--------+--------+-------+ Origin             143      25           +-----------------+--------+--------+-------+ Proximal            33      15           +-----------------+--------+--------+-------+ Mid                 45      17           +-----------------+--------+--------+-------+ Distal              46      19           +-----------------+--------+--------+-------+ +------------+--------+--------+----+-----------+--------+--------+----+ Right KidneyPSV cm/sEDV cm/sRI  Left KidneyPSV cm/sEDV cm/sRI   +------------+--------+--------+----+-----------+--------+--------+----+ Upper Pole  14      6       0.54Upper Pole 20      7       0.63 +------------+--------+--------+----+-----------+--------+--------+----+ Mid         13      4       0.65Mid        20      10      0.50 +------------+--------+--------+----+-----------+--------+--------+----+ Lower Pole  12      6       0.50Lower Pole 20      5       0.72 +------------+--------+--------+----+-----------+--------+--------+----+ Hilar       18      7       0.61Hilar      25      7       0.72 +------------+--------+--------+----+-----------+--------+--------+----+ +------------------+--------+------------------+---------+ Right Kidney              Left Kidney                 +------------------+--------+------------------+---------+ RAR                       RAR                         +------------------+--------+------------------+---------+ RAR (manual)      1.04    RAR (manual)      1.74      +------------------+--------+------------------+---------+ Cortex            9/4 cm/sCortex            13/7 cm/s +------------------+--------+------------------+---------+ Cortex thickness          Corex thickness             +------------------+--------+------------------+---------+  Kidney length (cm)12.50   Kidney length  (cm)11.50     +------------------+--------+------------------+---------+  Summary: Renal:  Right: No evidence of right renal artery stenosis. RRV flow present. Left:  1-59% stenosis of the left renal artery. LRV flow present.  *See table(s) above for measurements and observations.  Diagnosing physician: Deitra Mayo MD  Electronically signed by Deitra Mayo MD on 07/06/2020 at 3:49:47 PM.    Final      Subjective: No acute issues or events overnight, patient appears euvolemic denies shortness of breath, dyspnea, chest pain, nausea, vomiting, diarrhea, constipation, headache, fevers, chills.   Discharge Exam: Vitals:   07/07/20 0950 07/07/20 1305  BP:  (!) 137/91  Pulse:    Resp: 18   Temp:    SpO2:     Vitals:   07/07/20 0643 07/07/20 0757 07/07/20 0950 07/07/20 1305  BP: (!) 158/106 134/78  (!) 137/91  Pulse:  80    Resp:  18 18   Temp:  98 F (36.7 C)    TempSrc:  Oral    SpO2:  96%    Weight:      Height:        General: Pt is alert, awake, not in acute distress Cardiovascular: RRR, S1/S2 +, no rubs, no gallops Respiratory: CTA bilaterally, no wheezing, no rhonchi Abdominal: Soft, NT, ND, bowel sounds + Extremities: no edema, no cyanosis    The results of significant diagnostics from this hospitalization (including imaging, microbiology, ancillary and laboratory) are listed below for reference.     Microbiology: Recent Results (from the past 240 hour(s))  Respiratory Panel by RT PCR (Flu A&B, Covid) - Nasopharyngeal Swab     Status: None   Collection Time: 07/05/20  2:04 AM   Specimen: Nasopharyngeal Swab  Result Value Ref Range Status   SARS Coronavirus 2 by RT PCR NEGATIVE NEGATIVE Final    Comment: (NOTE) SARS-CoV-2 target nucleic acids are NOT DETECTED.  The SARS-CoV-2 RNA is generally detectable in upper respiratoy specimens during the acute phase of infection. The lowest concentration of SARS-CoV-2 viral copies this assay can detect is 131  copies/mL. A negative result does not preclude SARS-Cov-2 infection and should not be used as the sole basis for treatment or other patient management decisions. A negative result may occur with  improper specimen collection/handling, submission of specimen other than nasopharyngeal swab, presence of viral mutation(s) within the areas targeted by this assay, and inadequate number of viral copies (<131 copies/mL). A negative result must be combined with clinical observations, patient history, and epidemiological information. The expected result is Negative.  Fact Sheet for Patients:  PinkCheek.be  Fact Sheet for Healthcare Providers:  GravelBags.it  This test is no t yet approved or cleared by the [place] FDA and  has been authorized for detection and/or diagnosis of SARS-CoV-2 by FDA under an Emergency Use Authorization (EUA). This EUA will remain  in effect (meaning this test can be used) for the duration of the COVID-19 declaration under Section 564(b)(1) of the Act, 21 U.S.C. section 360bbb-3(b)(1), unless the authorization is terminated or revoked sooner.     Influenza A by PCR NEGATIVE NEGATIVE Final   Influenza B by PCR NEGATIVE NEGATIVE Final    Comment: (NOTE) The Xpert Xpress SARS-CoV-2/FLU/RSV assay is intended as an aid in  the diagnosis of influenza from Nasopharyngeal swab specimens and  should not be used as a sole basis for treatment. Nasal washings and  aspirates are unacceptable for Xpert Xpress SARS-CoV-2/FLU/RSV  testing.  Fact Sheet for Patients: PinkCheek.be  Fact Sheet for Healthcare Providers: GravelBags.it  This test is not yet approved or cleared by the [place] FDA and  has been authorized for detection and/or diagnosis of SARS-CoV-2 by  FDA under an Emergency Use Authorization (EUA). This EUA will remain  in effect (meaning  this test can be used) for the duration of the  Covid-19 declaration under Section 564(b)(1) of the Act, 21  U.S.C. section 360bbb-3(b)(1), unless the authorization is  terminated or revoked. Performed at [facility], [place] [address] [place], [place] [address]      Labs: BNP (last 3 results) Recent Labs    07/05/20 0204  BNP 4,431.5*   Basic Metabolic Panel: Recent Labs  Lab 07/05/20 0204 07/05/20 1210 07/06/20 0306 07/07/20 0234  NA 140 139 137 139  K 2.7* 2.8* 2.8* 3.1*  CL 107 102 101 100  CO2 '25 25 27 29  ' GLUCOSE 112* 107* 110* 101*  BUN 22* 19 23* 28*  CREATININE 1.61* 1.60* 2.00* 2.12*  CALCIUM 8.2* 8.7* 8.2* 8.5*  MG  --   --  2.1  --    Liver Function Tests: Recent Labs  Lab 07/05/20 0204  AST 24  ALT 34  ALKPHOS 77  BILITOT 0.8  PROT 5.7*  ALBUMIN 2.6*   No results for input(s): LIPASE, AMYLASE in the last 168 hours. No results for input(s): AMMONIA in the last 168 hours. CBC: Recent Labs  Lab 07/05/20 0204 07/06/20 0306 07/07/20 0234  WBC 7.9 8.0 6.5  NEUTROABS 6.0  --   --   HGB 9.5* 9.2* 10.1*  HCT 31.6* 29.6* 33.4*  MCV 68.7* 66.8* 66.5*  PLT 251 246 285   Cardiac Enzymes: No results for input(s): CKTOTAL, CKMB, CKMBINDEX, TROPONINI in the last 168 hours. BNP: Invalid input(s): POCBNP CBG: No results for input(s): GLUCAP in the last 168 hours. D-Dimer No results for input(s): DDIMER in the last 72 hours. Hgb A1c No results for input(s): HGBA1C in the last 72 hours. Lipid Profile No results for input(s): CHOL, HDL, LDLCALC, TRIG, CHOLHDL, LDLDIRECT in the last 72 hours. Thyroid function studies No results for input(s): TSH, T4TOTAL, T3FREE, THYROIDAB in the last 72 hours.  Invalid input(s): FREET3 Anemia work up No results for input(s): VITAMINB12, FOLATE, FERRITIN, TIBC, IRON, RETICCTPCT in the last 72 hours. Urinalysis No results found for: COLORURINE, APPEARANCEUR, [place], [place], [place], [place], [place],  [place], PROTEINUR, UROBILINOGEN, NITRITE, LEUKOCYTESUR Sepsis Labs Invalid input(s): PROCALCITONIN,  WBC,  LACTICIDVEN Microbiology Recent Results (from the past 240 hour(s))  Respiratory Panel by RT PCR (Flu A&B, Covid) - Nasopharyngeal Swab     Status: None   Collection Time: 07/05/20  2:04 AM   Specimen: Nasopharyngeal Swab  Result Value Ref Range Status   SARS Coronavirus 2 by RT PCR NEGATIVE NEGATIVE Final    Comment: (NOTE) SARS-CoV-2 target nucleic acids are NOT DETECTED.  The SARS-CoV-2 RNA is generally detectable in upper respiratoy specimens during the acute phase of infection. The lowest concentration of SARS-CoV-2 viral copies this assay can detect is 131 copies/mL. A negative result does not preclude SARS-Cov-2 infection and should not be used as the sole basis for treatment or other patient management decisions. A negative result may occur with  improper specimen collection/handling, submission of specimen other than nasopharyngeal swab, presence of viral mutation(s) within the areas targeted by this assay, and inadequate number of viral copies (<131 copies/mL). A negative result must be combined with clinical observations, patient  history, and epidemiological information. The expected result is Negative.  Fact Sheet for Patients:  PinkCheek.be  Fact Sheet for Healthcare Providers:  GravelBags.it  This test is no t yet approved or cleared by the [place] FDA and  has been authorized for detection and/or diagnosis of SARS-CoV-2 by FDA under an Emergency Use Authorization (EUA). This EUA will remain  in effect (meaning this test can be used) for the duration of the COVID-19 declaration under Section 564(b)(1) of the Act, 21 U.S.C. section 360bbb-3(b)(1), unless the authorization is terminated or revoked sooner.     Influenza A by PCR NEGATIVE NEGATIVE Final   Influenza B by PCR NEGATIVE NEGATIVE  Final    Comment: (NOTE) The Xpert Xpress SARS-CoV-2/FLU/RSV assay is intended as an aid in  the diagnosis of influenza from Nasopharyngeal swab specimens and  should not be used as a sole basis for treatment. Nasal washings and  aspirates are unacceptable for Xpert Xpress SARS-CoV-2/FLU/RSV  testing.  Fact Sheet for Patients: PinkCheek.be  Fact Sheet for Healthcare Providers: GravelBags.it  This test is not yet approved or cleared by the [place] FDA and  has been authorized for detection and/or diagnosis of SARS-CoV-2 by  FDA under an Emergency Use Authorization (EUA). This EUA will remain  in effect (meaning this test can be used) for the duration of the  Covid-19 declaration under Section 564(b)(1) of the Act, 21  U.S.C. section 360bbb-3(b)(1), unless the authorization is  terminated or revoked. Performed at [facility], [place] [address] [place], [place] [address]      Time coordinating discharge: Over 30 minutes  SIGNED:   Little Ishikawa, DO [facility] 07/07/2020, 2:40 PM Pager   If 7PM-7AM, please contact night-coverage [email or web address]

## 2020-07-07 NOTE — Progress Notes (Addendum)
Progress Note  Patient Name: Gerald [name] Date of Encounter: 07/07/2020  [facility] Cardiologist: Darylene Price ([facility]) or Dr. Rockey Situ   Subjective   Feeling well. No chest pain, sob or palpitations.   Inpatient Medications    Scheduled Meds:  carvedilol  25 mg Oral BID WC   [START ON 07/08/2020] furosemide  20 mg Oral Daily   hydrALAZINE  100 mg Oral BID   potassium chloride  40 mEq Oral Q4H   rivaroxaban  20 mg Oral Q supper   sodium chloride flush  3 mL Intravenous Q12H   Continuous Infusions:  sodium chloride Stopped (07/06/20 0530)   PRN Meds: sodium chloride, acetaminophen, ibuprofen, ondansetron (ZOFRAN) IV, sodium chloride flush, zolpidem   Vital Signs    Vitals:   07/07/20 0528 07/07/20 0643 07/07/20 0757 07/07/20 0950  BP: (!) 144/101 (!) 158/106 134/78   Pulse:   80   Resp:   18 18  Temp:   98 F (36.7 C)   TempSrc:   Oral   SpO2:   96%   Weight:      Height:        Intake/Output Summary (Last 24 hours) at 07/07/2020 1142 Last data filed at 07/07/2020 1117 Gross per 24 hour  Intake 1646 ml  Output 1250 ml  Net 396 ml   Last 3 Weights 07/07/2020 07/06/2020 07/05/2020  Weight (lbs) 240 lb 0.2 oz 242 lb 4.6 oz 242 lb 4.8 oz  Weight (kg) 108.87 kg 109.9 kg 109.907 kg      Telemetry    NSR - Personally Reviewed  ECG    N/A  Physical Exam   GEN: No acute distress.   Neck: No JVD Cardiac: RRR, no murmurs, rubs, or gallops.  Respiratory: Clear to auscultation bilaterally. GI: Soft, nontender, non-distended  MS: No edema; No deformity. Neuro:  Nonfocal  Psych: Normal affect   Labs    High Sensitivity Troponin:   Recent Labs  Lab 07/05/20 0204 07/05/20 0411  TROPONINIHS 693* 622*      Chemistry Recent Labs  Lab 07/05/20 0204 07/05/20 0204 07/05/20 1210 07/06/20 0306 07/07/20 0234  NA 140   < > 139 137 139  K 2.7*   < > 2.8* 2.8* 3.1*  CL 107   < > 102 101 100  CO2 25   < > 25 27 29   GLUCOSE 112*   < > 107*  110* 101*  BUN 22*   < > 19 23* 28*  CREATININE 1.61*   < > 1.60* 2.00* 2.12*  CALCIUM 8.2*   < > 8.7* 8.2* 8.5*  PROT 5.7*  --   --   --   --   ALBUMIN 2.6*  --   --   --   --   AST 24  --   --   --   --   ALT 34  --   --   --   --   ALKPHOS 77  --   --   --   --   BILITOT 0.8  --   --   --   --   GFRNONAA 55*   < > 56* 43* 40*  ANIONGAP 8   < > 12 9 10    < > = values in this interval not displayed.     Hematology Recent Labs  Lab 07/05/20 0204 07/06/20 0306 07/07/20 0234  WBC 7.9 8.0 6.5  RBC 4.60 4.43 5.02  HGB 9.5* 9.2* 10.1*  HCT 31.6* 29.6* 33.4*  MCV 68.7* 66.8* 66.5*  MCH 20.7* 20.8* 20.1*  MCHC 30.1 31.1 30.2  RDW 17.0* 16.5* 16.7*  PLT 251 246 285    BNP Recent Labs  Lab 07/05/20 0204  BNP 1,066.8*     Radiology    VAS US RENAL ARTERY DUPLEX  Result Date: 07/06/2020 ABDOMINAL VISCERAL Indications: Resistant hypertension Other Factors: CHF. Polysubstance abuse. Comparison Study: 04/06/2019- no high grade renal artery stenosis Performing Technologist: Maudry Mayhew MHA, RDMS, RVT, RDCS  Examination Guidelines: A complete evaluation includes B-mode imaging, spectral Doppler, color Doppler, and power Doppler as needed of all accessible portions of each vessel. Bilateral testing is considered an integral part of a complete examination. Limited examinations for reoccurring indications may be performed as noted.  Duplex Findings: +--------------------+--------+--------+------+--------+ Mesenteric          PSV cm/sEDV cm/sPlaqueComments +--------------------+--------+--------+------+--------+ Aorta Prox             82      18                  +--------------------+--------+--------+------+--------+ Celiac Artery Origin  112                          +--------------------+--------+--------+------+--------+ SMA Proximal          164      23                  +--------------------+--------+--------+------+--------+     +------------------+--------+--------+-------+ Right Renal ArteryPSV cm/sEDV cm/sComment +------------------+--------+--------+-------+ Origin               85      26           +------------------+--------+--------+-------+ Proximal             46      16           +------------------+--------+--------+-------+ Mid                  54      13           +------------------+--------+--------+-------+ Distal               40      14           +------------------+--------+--------+-------+ +-----------------+--------+--------+-------+ Left Renal ArteryPSV cm/sEDV cm/sComment +-----------------+--------+--------+-------+ Origin             143      25           +-----------------+--------+--------+-------+ Proximal            33      15           +-----------------+--------+--------+-------+ Mid                 45      17           +-----------------+--------+--------+-------+ Distal              46      19           +-----------------+--------+--------+-------+ +------------+--------+--------+----+-----------+--------+--------+----+ Right KidneyPSV cm/sEDV cm/sRI  Left KidneyPSV cm/sEDV cm/sRI   +------------+--------+--------+----+-----------+--------+--------+----+ Upper Pole  14      6       0.54Upper Pole 20      7       0.63 +------------+--------+--------+----+-----------+--------+--------+----+ Mid         13      4  0.65Mid        20      10      0.50 +------------+--------+--------+----+-----------+--------+--------+----+ Lower Pole  12      6       0.50Lower Pole 20      5       0.72 +------------+--------+--------+----+-----------+--------+--------+----+ Hilar       18      7       0.61Hilar      25      7       0.72 +------------+--------+--------+----+-----------+--------+--------+----+ +------------------+--------+------------------+---------+ Right Kidney              Left Kidney                  +------------------+--------+------------------+---------+ RAR                       RAR                         +------------------+--------+------------------+---------+ RAR (manual)      1.04    RAR (manual)      1.74      +------------------+--------+------------------+---------+ Cortex            9/4 cm/sCortex            13/7 cm/s +------------------+--------+------------------+---------+ Cortex thickness          Corex thickness             +------------------+--------+------------------+---------+ Kidney length (cm)12.50   Kidney length (cm)11.50     +------------------+--------+------------------+---------+  Summary: Renal:  Right: No evidence of right renal artery stenosis. RRV flow present. Left:  1-59% stenosis of the left renal artery. LRV flow present.  *See table(s) above for measurements and observations.  Diagnosing physician: Deitra Mayo MD  Electronically signed by Deitra Mayo MD on 07/06/2020 at 3:49:47 PM.    Final     Cardiac Studies   Echo 07/05/20  1. Left ventricular ejection fraction, by estimation, is 30 to 35%. The  left ventricle has moderately decreased function. The left ventricle  demonstrates global hypokinesis. The left ventricular internal cavity size  was mildly dilated. There is severe  left ventricular hypertrophy. Left ventricular diastolic parameters are  consistent with [name] diastolic dysfunction (pseudonormalization).  Elevated left atrial pressure.   2. Right ventricular systolic function is normal. The right ventricular  size is normal. Tricuspid regurgitation signal is inadequate for assessing  PA pressure.   3. Left atrial size was severely dilated.   4. The mitral valve is normal in structure. Trivial mitral valve  regurgitation.   5. The aortic valve is tricuspid. Aortic valve regurgitation is trivial.  No aortic stenosis is present.   6. Aortic dilatation noted. There is mild dilatation of the aortic  root,  measuring 40 mm. There is mild dilatation of the ascending aorta,  measuring 39 mm.   7. The inferior vena cava is dilated in size with >50% respiratory  variability, suggesting right atrial pressure of 8 mmHg.   Patient Profile     39 y.o. male with hx of NICM/chronic systolic CHF, PAF, hypertension, non compliance and prior substance abuse seen for CHF.   Assessment & Plan    1. Chronic combined CHF - Echo this admission showed LVEF of 30-35% and garde [name] DD. Severely dilated LA.  - BNP elevated - CXR with vascular congestion  - Given IV lasix with net negative 4L diuresis. He  appears euvolemic. However renal function continues to worsen. Weight down 2 lb 242>>240lb today.  - He got ACE yesterday, ARB today and plan to start [facility] today >>> however given worsen renal function avoid nephrotoxic agent - Stop IV lasix >> start Po lasix 20mg  starting tomorrow - Stop ACE/ARB/Entresto - Stop spironolactone - Continue Coreg - Continue hydralazine for afterload reduction  - He does not appears volume overloaded - ? NICM 2nd to hypertensive mediated. However don't seen any ischemic evaluation in review. May consider stress test as outpatient if not invasive with L&RHC given renal function.   - Check BMP and BNP tomorrow   2. Hypertensive urgency in setting of non compliance  - Elevated on arrival - BP stable today - Medication adjustment as above - Follow BP closely  3. PAF/Flutter - Prior cardioversion when admitted 03/2019 - Maintaining sinus rhythm  - Continue Coreg - Continue Xarelto (follow closely with worsen renal function). Application for Assistance program has been submitted  4. Acute on presumed CKD III - Seem baseline renal function around 1.3-4.6 - Scr 2.12 today - As above  5. Hypokalemia - Supplement given  - Keep K > 4  Otherwise per primary. Recommended given medications from [facility] or community pharmacy.     For questions or updates, please contact  [place] Please consult [email or web address] for contact info under        SignedLeanor Kail, PA  07/07/2020, 11:42 AM    Patient examined chart reviewed Has had significant bump in Cr that may alter RX He is less dyspneic and lungs clear. Hold ARB/Entresto cut back lasix and make oral Continue coreg and d/c aldactone F/U Cr in am If better can send home on coreg, low dose lasix And low dose ARB with close f/u in CHF clinic   Jenkins Rouge MD [facility]

## 2020-07-08 ENCOUNTER — Encounter (HOSPITAL_COMMUNITY): Payer: Self-pay

## 2020-07-10 ENCOUNTER — Other Ambulatory Visit
Admission: RE | Admit: 2020-07-10 | Discharge: 2020-07-10 | Disposition: A | Discharge status: Home / Self Care | Payer: Self-pay | Source: Ambulatory Visit | Attending: Physician Assistant | Admitting: Physician Assistant

## 2020-07-10 ENCOUNTER — Encounter: Payer: Self-pay | Admitting: Physician Assistant

## 2020-07-10 ENCOUNTER — Ambulatory Visit (INDEPENDENT_AMBULATORY_CARE_PROVIDER_SITE_OTHER): Payer: Self-pay | Admitting: Physician Assistant

## 2020-07-10 ENCOUNTER — Other Ambulatory Visit: Payer: Self-pay

## 2020-07-10 VITALS — BP 168/110 | HR 76 | Ht 72.0 in | Wt 243.4 lb

## 2020-07-10 DIAGNOSIS — I4892 Unspecified atrial flutter: Secondary | ICD-10-CM

## 2020-07-10 DIAGNOSIS — I5023 Acute on chronic systolic (congestive) heart failure: Secondary | ICD-10-CM

## 2020-07-10 DIAGNOSIS — I5042 Chronic combined systolic (congestive) and diastolic (congestive) heart failure: Secondary | ICD-10-CM

## 2020-07-10 DIAGNOSIS — I10 Essential (primary) hypertension: Secondary | ICD-10-CM

## 2020-07-10 DIAGNOSIS — I4819 Other persistent atrial fibrillation: Secondary | ICD-10-CM

## 2020-07-10 DIAGNOSIS — E785 Hyperlipidemia, unspecified: Secondary | ICD-10-CM

## 2020-07-10 DIAGNOSIS — Z720 Tobacco use: Secondary | ICD-10-CM

## 2020-07-10 LAB — CBC
HCT: 33.5 % — ABNORMAL LOW (ref 39.0–52.0)
Hemoglobin: 10.2 g/dL — ABNORMAL LOW (ref 13.0–17.0)
MCH: 20.5 pg — ABNORMAL LOW (ref 26.0–34.0)
MCHC: 30.4 g/dL (ref 30.0–36.0)
MCV: 67.3 fL — ABNORMAL LOW (ref 80.0–100.0)
Platelets: 343 10*3/uL (ref 150–400)
RBC: 4.98 MIL/uL (ref 4.22–5.81)
RDW: 16.8 % — ABNORMAL HIGH (ref 11.5–15.5)
WBC: 5.8 10*3/uL (ref 4.0–10.5)
nRBC: 0 % (ref 0.0–0.2)

## 2020-07-10 LAB — BASIC METABOLIC PANEL
Anion gap: 9 (ref 5–15)
BUN: 33 mg/dL — ABNORMAL HIGH (ref 6–20)
CO2: 25 mmol/L (ref 22–32)
Calcium: 8.7 mg/dL — ABNORMAL LOW (ref 8.9–10.3)
Chloride: 103 mmol/L (ref 98–111)
Creatinine, Ser: 1.86 mg/dL — ABNORMAL HIGH (ref 0.61–1.24)
GFR, Estimated: 47 mL/min — ABNORMAL LOW (ref >60)
Glucose, Bld: 101 mg/dL — ABNORMAL HIGH (ref 70–99)
Potassium: 3.6 mmol/L (ref 3.5–5.1)
Sodium: 137 mmol/L (ref 135–145)

## 2020-07-10 MED ORDER — HYDRALAZINE HCL 10 MG PO TABS
25.0000 mg | ORAL_TABLET | Freq: Once | ORAL | Status: AC
  Administered 2020-07-10: 25 mg via ORAL

## 2020-07-10 MED ORDER — ENTRESTO 24-26 MG PO TABS: 1.0000 | ORAL_TABLET | Freq: Two times a day (BID) | ORAL | 3 refills | Status: DC

## 2020-07-10 NOTE — Progress Notes (Signed)
Office Visit    Patient Name: Gerald [name] Date of Encounter: 07/11/2020  Primary Care Provider:  Patient, [name] Primary Cardiologist:  Ida Rogue, MD  Chief Complaint    Chief Complaint  Patient presents with  . Follow-up    [facility];     39 year old male with history of acute on chronic systolic and diastolic heart failure, atrial flutter s/p TEE guided cardioversion on anticoagulation, polysubstance use including cocaine, alcohol, tobacco, hypertension, obesity, seen today for hospital follow-up after CHF admission 07/05/20.  Past Medical History    Past Medical History:  Diagnosis Date  . Arrhythmia    atrial fibrillation  . CHF (congestive heart failure) ([place])   . Hypertension   . Polysubstance abuse [facility])    Past Surgical History:  Procedure Laterality Date  . KNEE SURGERY Right   . TEE WITHOUT CARDIOVERSION N/A 04/04/2019   Procedure: TRANSESOPHAGEAL ECHOCARDIOGRAM (TEE);  Surgeon: [name], MD;  Location: [facility] ORS;  Service: Cardiovascular;  Laterality: N/A;    Allergies  No Known Allergies  History of Present Illness    Gerald [name] is a 39 y.o. male with PMH as above and including newly diagnosed acute on chronic systolic and atrial flutter.  He is seen today for hospital follow-up.  He was previously admitted in 2017 with CAP, chest pain, and elevated troponin.  He was managed conservatively with IV heparin and ASA per outside cardiology group.  Echo at that time showed EF 60%, severe concentric LVH, G1 DD, mild MR, mildly dilated LA.  His CP was felt to be atypical with outpatient follow-up recommended.  Seen in the ED 06/2018 after running out of his medications for approximately 6 months.  Admitted to [facility] and seen by cardiology 04/01/2019.  He has been out of his medications, including his antihypertensives and statins for approximately 3 months due to financial constraints.  He was found to have acute on chronic systolic and  diastolic heart failure with newly reduced EF, complicated by atrial flutter.  It was thought that his newly reduced EF was likely due to severe hypertensive heart disease.  He was weaned off of clonidine.  Diltiazem was discontinued due to reduced EF.  Both carvedilol and losartan will increase.  He was started on anticoagulation with Xarelto. He underwent successful TEE guided cardioversion without complications and NSR restored.   He was seen again in the hospital and by cardiology 07/06/2020 after repeat admission for CHF on 07/05/2020.  He admitted to increasing edema, dyspnea, and poor lifestyle habits with recurrent drug use.  Today, 07/10/2020, he returns to clinic and notes he is doing well from a cardiac standpoint.  He denies any chest pain, racing heart rate, or palpitations.  No presyncope or syncope.  He denies any signs or symptoms of volume overload, including orthopnea, PND, early satiety, lower extremity edema.   He has been taking his Lasix and notes that this makes him pee frequently.  He has been trying to eat a healthy diet.  Long discussion regarding fluid and salt intake, as well as the recommended guidelines as outlined below.  We discussed heart healthy diet and increasing activity as tolerated.  He has been doing [employer] 30-minute exercises every day via YouTube on the Internet.  No signs or symptoms of bleeding.  He reports medication compliance.  He has been monitoring his blood pressure at home with BP 150/78 this a.m. before coming into the office.  He does  note that he felt his blood pressure may be elevated at the start of his visit due to an argument that he got in 2 with a friend before his visit.  Home Medications    Current Outpatient Medications on File Prior to Visit  Medication Sig Dispense Refill  . atorvastatin (LIPITOR) 40 MG tablet Take 1 tablet (40 mg total) by mouth daily. 30 tablet 1  . carvedilol (COREG) 25 MG tablet Take 1 tablet (25 mg total) by mouth 2  (two) times daily with a meal. 60 tablet 1  . furosemide (LASIX) 20 MG tablet Take 1 tablet (20 mg total) by mouth daily. 30 tablet 0  . hydrALAZINE (APRESOLINE) 100 MG tablet Take 1 tablet (100 mg total) by mouth 2 (two) times daily. 60 tablet 1  . potassium chloride SA (K-DUR) 20 MEQ tablet Take 1 tablet (20 mEq total) by mouth daily. 30 tablet 5  . rivaroxaban (XARELTO) 20 MG TABS tablet Take 1 tablet (20 mg total) by mouth daily with supper. 30 tablet 0  . spironolactone (ALDACTONE) 25 MG tablet Take 1 tablet (25 mg total) by mouth daily. 30 tablet 0   No current facility-administered medications on file prior to visit.    Review of Systems    He denies chest pain, palpitations, dyspnea, pnd, orthopnea, n, v, dizziness, syncope, edema, weight gain, or early satiety.   All other systems reviewed and are otherwise negative except as noted above.  Physical Exam    VS:  BP (!) 168/110 (BP Location: Left Arm, Patient Position: Sitting, Cuff Size: Large)   Pulse 76   Ht 6' (1.829 m)   Wt 243 lb 6 oz (110.4 kg)   SpO2 98%   BMI 33.01 kg/m  , BMI Body mass index is 33.01 kg/m. GEN: Well nourished, well developed, in no acute distress. HEENT: normal. Neck: Supple, no JVD, carotid bruits, or masses. Cardiac: RRR, no murmurs, rubs, or gallops. No clubbing, cyanosis, edema.  Radials/DP/PT 2+ and equal bilaterally.  Respiratory:  Respirations regular and unlabored, clear to auscultation bilaterally. GI: Soft, nontender, nondistended, BS + x 4. MS: no deformity or atrophy. Skin: warm and dry, no rash. Neuro:  Strength and sensation are intact. Psych: Normal affect.  Accessory Clinical Findings    ECG personally reviewed by me today -NSR with first-degree AV block and PR interval 202 ms, possible left atrial enlargement, left axis deviation, LAFB, IVCD, QRS 108 ms, LVH, previous poor R wave progression in the septal leads, previously seen T wave abnormality in the lateral leads, QTC 468 ms-  no acute changes.  VITALS Reviewed today   Temp Readings from Last 3 Encounters:  07/07/20 98 F (36.7 C) (Oral)  04/09/19 98.3 F (36.8 C)  06/27/18 98.5 F (36.9 C) (Oral)   BP Readings from Last 3 Encounters:  07/10/20 (!) 168/110  07/07/20 (!) 137/91  05/06/19 (!) 147/88   Pulse Readings from Last 3 Encounters:  07/10/20 76  07/07/20 80  05/06/19 74    Wt Readings from Last 3 Encounters:  07/10/20 243 lb 6 oz (110.4 kg)  07/07/20 240 lb 0.2 oz (108.9 kg)  05/06/19 255 lb 2 oz (115.7 kg)     LABS  reviewed today   Lab Results  Component Value Date   WBC 5.8 07/10/2020   HGB 10.2 (L) 07/10/2020   HCT 33.5 (L) 07/10/2020   MCV 67.3 (L) 07/10/2020   PLT 343 07/10/2020   Lab Results  Component Value Date  CREATININE 1.86 (H) 07/10/2020   BUN 33 (H) 07/10/2020   NA 137 07/10/2020   K 3.6 07/10/2020   CL 103 07/10/2020   CO2 25 07/10/2020   Lab Results  Component Value Date   ALT 34 07/05/2020   AST 24 07/05/2020   ALKPHOS 77 07/05/2020   BILITOT 0.8 07/05/2020   No results found for: CHOL, HDL, LDLCALC, LDLDIRECT, TRIG, CHOLHDL  Lab Results  Component Value Date   HGBA1C 5.8 (H) 04/01/2019   Lab Results  Component Value Date   TSH 1.171 04/01/2019     STUDIES/PROCEDURES reviewed today   Echo 04/01/20 1. The left ventricle has moderate-severely reduced systolic function, with an ejection fraction of 30-35%. The cavity size was mildly dilated. There is severely increased left ventricular wall thickness. Left ventricular diastolic Doppler parameters  are indeterminate. Left ventricular diffuse hypokinesis. 2. The right ventricle has normal systolic function. The cavity was normal. There is mildly increased right ventricular wall thickness. Right ventricular systolic pressure is moderately elevated with an estimated pressure of 45.8 mmHg. 3. Mitral valve regurgitation is moderate 4. Tricuspid valve regurgitation is moderate. 5. Rhythm is atrial  flutter, ventricularrate 70 bpm  Assessment & Plan    Chronic systolic and diastolic heart failure --No shortness of breath.  Euvolemic and well compensated on exam. 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  --Long discussion regarding fluid intake under 2 L daily and salt intake under 2 g daily. --Continue to recommend heart healthy diet and increasing activity as tolerated. --Discussed his blood pressure today and recommendation for better BP control. --Due to elevated pressure in the clinic, hydralazine administered before he left the office. --Continue current medications with recommendation for initiation of Entresto pending BMET today.  Repeat BMET within 1 to 2 weeks.  Atrial flutter s/p TEE/DCCV --Maintaining sinus rhythm following TEE/DCCV.   --Continue Xarelto.  No signs or symptoms of bleeding.  Obtain CBC.  --Continue Coreg.  Rate well controlled today.   HTN, poorly controlled  --Poorly controlled.  As above, hydralazine administered in clinic before he left the office due to continued elevated pressures on recheck. --Initiate Entresto pending BMET, obtained today.  Repeat BMET to take place within 1 to 2 weeks.  HLD --Continue statin  Polysubstance use --Complete cessation recommended.   Disposition: RTC 1-2 weeks     Arvil Chaco, PA-C 07/11/2020

## 2020-07-10 NOTE — Patient Instructions (Signed)
Medication Instructions:   Your physician has recommended you make the following change in your medication:   1.  START taking Entresto 24-26 MG: Take 1 tab by mouth 2X a day.  *If you need a refill on your cardiac medications before your next appointment, please call your pharmacy*   Lab Work: 1.  BMP, CBC today  2.  A follow up BMP will be drawn at your next visit in 2 weeks.  If you have labs (blood work) drawn today and your tests are completely normal, you will receive your results only by:  [place] (if you have MyChart) OR  A paper copy in the mail If you have any lab test that is abnormal or we need to change your treatment, we will call you to review the results.   Testing/Procedures: None Ordered    Follow-Up: At [facility], you and your health needs are our priority.  As part of our continuing mission to provide you with exceptional heart care, we have created designated Provider Care Teams.  These Care Teams include your primary Cardiologist (physician) and Advanced Practice Providers (APPs -  Physician Assistants and Nurse Practitioners) who all work together to provide you with the care you need, when you need it.  We recommend signing up for the patient portal called "MyChart".  Sign up information is provided on this After Visit Summary.  MyChart is used to connect with patients for Virtual Visits (Telemedicine).  Patients are able to view lab/test results, encounter notes, upcoming appointments, etc.  Non-urgent messages can be sent to your provider as well.   To learn more about what you can do with MyChart, go to [email or web address].    Your next appointment:   2 week(s)  The format for your next appointment:   In Person  Provider:   You may see Dr. Rockey Situ  or one of the following Advanced Practice Providers on your designated Care Team:    Murray Hodgkins, NP  Christell Faith, PA-C  Marrianne Mood, PA-C  Cadence Montgomery, [place]  Laurann Montana, NP    Other Instructions

## 2020-07-16 ENCOUNTER — Telehealth: Payer: Self-pay

## 2020-07-16 ENCOUNTER — Telehealth: Payer: Self-pay | Admitting: *Deleted

## 2020-07-16 DIAGNOSIS — I5022 Chronic systolic (congestive) heart failure: Secondary | ICD-10-CM

## 2020-07-16 DIAGNOSIS — N289 Disorder of kidney and ureter, unspecified: Secondary | ICD-10-CM

## 2020-07-16 NOTE — Telephone Encounter (Signed)
-----   Message from Arvil Chaco, PA-C sent at 07/11/2020  5:11 PM EST ----- Renal function stable and actually improved from previous labs. Start Entresto 24-26 mg BID as discussed in clinic. Repeat BMET in 2 weeks.   Blood counts stable from previous labs on anticoagulation.

## 2020-07-16 NOTE — Telephone Encounter (Signed)
**Note De-Identified  Obfuscation** Letter received from [employer] pt Asst Foundation stating that they have approved the pt for asst with his Entresto. Approval is valid until 07/13/2021 Pt ID: [id number]  The letter states that they have notified the pt of this approval as well.

## 2020-07-16 NOTE — Telephone Encounter (Signed)
Reviewed labs and recommendations with patient. He verbalized understanding of medication to start and repeat labs to be done in 2 weeks at the [facility] entrance to have that done. Advised no appointment is needed and he verbalized understanding with no further questions at this time.

## 2020-07-16 NOTE — Telephone Encounter (Signed)
Call cannot be completed at this time. Please try your call again later.

## 2020-07-16 NOTE — Telephone Encounter (Signed)
Patient returning call.

## 2020-07-16 NOTE — Telephone Encounter (Signed)
Left voicemail message to call back for results.  

## 2020-07-17 ENCOUNTER — Telehealth (HOSPITAL_COMMUNITY): Payer: Self-pay

## 2020-07-17 NOTE — Telephone Encounter (Signed)
[employer] to follow up on application submitted on 07/06/20. The representative stated they have received the application and she will be sending it to be reviewed by a processor within the next 24-48 hours. Will contact [employer] again later this week to follow up on status.  Once approved into program, Xarelto copay will be $0 and patient will receive this medication from [employer].

## 2020-07-20 ENCOUNTER — Telehealth: Payer: Self-pay

## 2020-07-20 NOTE — Telephone Encounter (Signed)
Patient has been approved to receive Xarelto for $0 through [employer] from 07/20/20 through 07/20/21. ID: [id number]  Attempted to call patient but was unable to leave voicemail.

## 2020-07-24 ENCOUNTER — Ambulatory Visit: Payer: Self-pay | Admitting: Physician Assistant

## 2020-07-26 ENCOUNTER — Ambulatory Visit: Payer: Self-pay | Admitting: Physician Assistant

## 2020-07-31 ENCOUNTER — Ambulatory Visit: Payer: Self-pay | Admitting: Physician Assistant

## 2020-08-01 ENCOUNTER — Encounter: Payer: Self-pay | Admitting: Physician Assistant

## 2020-08-03 ENCOUNTER — Inpatient Hospital Stay: Payer: Self-pay | Admitting: Internal Medicine

## 2020-12-21 ENCOUNTER — Encounter (HOSPITAL_COMMUNITY): Payer: Self-pay

## 2020-12-21 ENCOUNTER — Inpatient Hospital Stay (HOSPITAL_COMMUNITY)
Admission: EM | Admit: 2020-12-21 | Discharge: 2020-12-24 | DRG: 286 | Discharge status: Against Medical Advice | Payer: Self-pay | Attending: Student | Admitting: Student

## 2020-12-21 ENCOUNTER — Encounter (HOSPITAL_COMMUNITY)
Admission: EM | Discharge status: Against Medical Advice | Payer: Self-pay | Source: Home / Self Care | Attending: Student

## 2020-12-21 ENCOUNTER — Emergency Department (HOSPITAL_COMMUNITY): Payer: Self-pay

## 2020-12-21 ENCOUNTER — Other Ambulatory Visit: Payer: Self-pay

## 2020-12-21 DIAGNOSIS — G4440 Drug-induced headache, not elsewhere classified, not intractable: Secondary | ICD-10-CM | POA: Diagnosis not present

## 2020-12-21 DIAGNOSIS — I420 Dilated cardiomyopathy: Secondary | ICD-10-CM

## 2020-12-21 DIAGNOSIS — Z5329 Procedure and treatment not carried out because of patient's decision for other reasons: Secondary | ICD-10-CM | POA: Diagnosis present

## 2020-12-21 DIAGNOSIS — F17210 Nicotine dependence, cigarettes, uncomplicated: Secondary | ICD-10-CM | POA: Diagnosis present

## 2020-12-21 DIAGNOSIS — G4700 Insomnia, unspecified: Secondary | ICD-10-CM | POA: Diagnosis present

## 2020-12-21 DIAGNOSIS — Z9114 Patient's other noncompliance with medication regimen: Secondary | ICD-10-CM

## 2020-12-21 DIAGNOSIS — I200 Unstable angina: Secondary | ICD-10-CM

## 2020-12-21 DIAGNOSIS — N1831 Chronic kidney disease, stage 3a: Secondary | ICD-10-CM | POA: Diagnosis present

## 2020-12-21 DIAGNOSIS — I252 Old myocardial infarction: Secondary | ICD-10-CM

## 2020-12-21 DIAGNOSIS — I429 Cardiomyopathy, unspecified: Secondary | ICD-10-CM | POA: Diagnosis present

## 2020-12-21 DIAGNOSIS — I10 Essential (primary) hypertension: Secondary | ICD-10-CM

## 2020-12-21 DIAGNOSIS — I213 ST elevation (STEMI) myocardial infarction of unspecified site: Secondary | ICD-10-CM

## 2020-12-21 DIAGNOSIS — M549 Dorsalgia, unspecified: Secondary | ICD-10-CM | POA: Diagnosis not present

## 2020-12-21 DIAGNOSIS — D509 Iron deficiency anemia, unspecified: Secondary | ICD-10-CM | POA: Diagnosis present

## 2020-12-21 DIAGNOSIS — N183 Chronic kidney disease, stage 3 unspecified: Secondary | ICD-10-CM | POA: Diagnosis present

## 2020-12-21 DIAGNOSIS — E876 Hypokalemia: Secondary | ICD-10-CM | POA: Diagnosis present

## 2020-12-21 DIAGNOSIS — F1410 Cocaine abuse, uncomplicated: Secondary | ICD-10-CM | POA: Diagnosis present

## 2020-12-21 DIAGNOSIS — T463X5A Adverse effect of coronary vasodilators, initial encounter: Secondary | ICD-10-CM | POA: Diagnosis not present

## 2020-12-21 DIAGNOSIS — R079 Chest pain, unspecified: Secondary | ICD-10-CM

## 2020-12-21 DIAGNOSIS — Z7901 Long term (current) use of anticoagulants: Secondary | ICD-10-CM

## 2020-12-21 DIAGNOSIS — I480 Paroxysmal atrial fibrillation: Secondary | ICD-10-CM | POA: Diagnosis present

## 2020-12-21 DIAGNOSIS — Z8249 Family history of ischemic heart disease and other diseases of the circulatory system: Secondary | ICD-10-CM

## 2020-12-21 DIAGNOSIS — N1832 Chronic kidney disease, stage 3b: Secondary | ICD-10-CM | POA: Diagnosis present

## 2020-12-21 DIAGNOSIS — I130 Hypertensive heart and chronic kidney disease with heart failure and stage 1 through stage 4 chronic kidney disease, or unspecified chronic kidney disease: Secondary | ICD-10-CM | POA: Diagnosis present

## 2020-12-21 DIAGNOSIS — I447 Left bundle-branch block, unspecified: Secondary | ICD-10-CM

## 2020-12-21 DIAGNOSIS — I161 Hypertensive emergency: Principal | ICD-10-CM | POA: Diagnosis present

## 2020-12-21 DIAGNOSIS — Z79899 Other long term (current) drug therapy: Secondary | ICD-10-CM

## 2020-12-21 DIAGNOSIS — I5043 Acute on chronic combined systolic (congestive) and diastolic (congestive) heart failure: Secondary | ICD-10-CM | POA: Diagnosis present

## 2020-12-21 DIAGNOSIS — I509 Heart failure, unspecified: Secondary | ICD-10-CM

## 2020-12-21 DIAGNOSIS — Y92230 Patient room in hospital as the place of occurrence of the external cause: Secondary | ICD-10-CM | POA: Diagnosis not present

## 2020-12-21 DIAGNOSIS — Z91120 Patient's intentional underdosing of medication regimen due to financial hardship: Secondary | ICD-10-CM

## 2020-12-21 DIAGNOSIS — N179 Acute kidney failure, unspecified: Secondary | ICD-10-CM | POA: Diagnosis present

## 2020-12-21 DIAGNOSIS — Z20822 Contact with and (suspected) exposure to covid-19: Secondary | ICD-10-CM | POA: Diagnosis present

## 2020-12-21 DIAGNOSIS — T50916A Underdosing of multiple unspecified drugs, medicaments and biological substances, initial encounter: Secondary | ICD-10-CM | POA: Diagnosis present

## 2020-12-21 DIAGNOSIS — Z6839 Body mass index (BMI) 39.0-39.9, adult: Secondary | ICD-10-CM

## 2020-12-21 HISTORY — PX: LEFT HEART CATH AND CORONARY ANGIOGRAPHY: CATH118249

## 2020-12-21 HISTORY — DX: Cardiac arrhythmia, unspecified: I49.9

## 2020-12-21 HISTORY — DX: Hypertensive emergency: I16.1

## 2020-12-21 LAB — ETHANOL: Alcohol, Ethyl (B): <10 mg/dL (ref <10)

## 2020-12-21 LAB — CBC WITH DIFFERENTIAL/PLATELET
Abs Immature Granulocytes: 0.02 10*3/uL (ref 0.00–0.07)
Basophils Absolute: 0.1 10*3/uL (ref 0.0–0.1)
Basophils Relative: 1 %
Eosinophils Absolute: 0.1 10*3/uL (ref 0.0–0.5)
Eosinophils Relative: 2 %
HCT: 34.5 % — ABNORMAL LOW (ref 39.0–52.0)
Hemoglobin: 10.2 g/dL — ABNORMAL LOW (ref 13.0–17.0)
Immature Granulocytes: 0 %
Lymphocytes Relative: 15 %
Lymphs Abs: 1.2 10*3/uL (ref 0.7–4.0)
MCH: 20.5 pg — ABNORMAL LOW (ref 26.0–34.0)
MCHC: 29.6 g/dL — ABNORMAL LOW (ref 30.0–36.0)
MCV: 69.3 fL — ABNORMAL LOW (ref 80.0–100.0)
Monocytes Absolute: 0.5 10*3/uL (ref 0.1–1.0)
Monocytes Relative: 6 %
Neutro Abs: 5.8 10*3/uL (ref 1.7–7.7)
Neutrophils Relative %: 76 %
Platelets: 252 10*3/uL (ref 150–400)
RBC: 4.98 MIL/uL (ref 4.22–5.81)
RDW: 17.7 % — ABNORMAL HIGH (ref 11.5–15.5)
WBC: 7.7 10*3/uL (ref 4.0–10.5)
nRBC: 0 % (ref 0.0–0.2)

## 2020-12-21 LAB — CBC
HCT: 34 % — ABNORMAL LOW (ref 39.0–52.0)
Hemoglobin: 10.3 g/dL — ABNORMAL LOW (ref 13.0–17.0)
MCH: 20.8 pg — ABNORMAL LOW (ref 26.0–34.0)
MCHC: 30.3 g/dL (ref 30.0–36.0)
MCV: 68.5 fL — ABNORMAL LOW (ref 80.0–100.0)
Platelets: 234 10*3/uL (ref 150–400)
RBC: 4.96 MIL/uL (ref 4.22–5.81)
RDW: 17.2 % — ABNORMAL HIGH (ref 11.5–15.5)
WBC: 7.5 10*3/uL (ref 4.0–10.5)
nRBC: 0 % (ref 0.0–0.2)

## 2020-12-21 LAB — BASIC METABOLIC PANEL
Anion gap: 10 (ref 5–15)
BUN: 30 mg/dL — ABNORMAL HIGH (ref 6–20)
CO2: 24 mmol/L (ref 22–32)
Calcium: 8.6 mg/dL — ABNORMAL LOW (ref 8.9–10.3)
Chloride: 105 mmol/L (ref 98–111)
Creatinine, Ser: 1.9 mg/dL — ABNORMAL HIGH (ref 0.61–1.24)
GFR, Estimated: 45 mL/min — ABNORMAL LOW (ref >60)
Glucose, Bld: 113 mg/dL — ABNORMAL HIGH (ref 70–99)
Potassium: 3 mmol/L — ABNORMAL LOW (ref 3.5–5.1)
Sodium: 139 mmol/L (ref 135–145)

## 2020-12-21 LAB — PROTIME-INR
INR: 1.1 (ref 0.8–1.2)
Prothrombin Time: 13.9 seconds (ref 11.4–15.2)

## 2020-12-21 LAB — RAPID URINE DRUG SCREEN, HOSP PERFORMED
Amphetamines: NOT DETECTED
Barbiturates: NOT DETECTED
Benzodiazepines: NOT DETECTED
Cocaine: POSITIVE — AB
Opiates: NOT DETECTED
Tetrahydrocannabinol: NOT DETECTED

## 2020-12-21 LAB — MRSA PCR SCREENING: MRSA by PCR: NEGATIVE

## 2020-12-21 LAB — RESP PANEL BY RT-PCR (FLU A&B, COVID) ARPGX2
Influenza A by PCR: NEGATIVE
Influenza B by PCR: NEGATIVE
SARS Coronavirus 2 by RT PCR: NEGATIVE

## 2020-12-21 LAB — TROPONIN I (HIGH SENSITIVITY)
Troponin I (High Sensitivity): 1954 ng/L (ref <18)
Troponin I (High Sensitivity): 2092 ng/L (ref <18)
Troponin I (High Sensitivity): 2272 ng/L (ref <18)

## 2020-12-21 LAB — CREATININE, SERUM
Creatinine, Ser: 2.01 mg/dL — ABNORMAL HIGH (ref 0.61–1.24)
GFR, Estimated: 42 mL/min — ABNORMAL LOW (ref >60)

## 2020-12-21 LAB — APTT: aPTT: 40 seconds — ABNORMAL HIGH (ref 24–36)

## 2020-12-21 LAB — MAGNESIUM: Magnesium: 2.1 mg/dL (ref 1.7–2.4)

## 2020-12-21 LAB — BRAIN NATRIURETIC PEPTIDE: B Natriuretic Peptide: 2309 pg/mL — ABNORMAL HIGH (ref 0.0–100.0)

## 2020-12-21 SURGERY — LEFT HEART CATH AND CORONARY ANGIOGRAPHY
Anesthesia: LOCAL

## 2020-12-21 MED ORDER — HEPARIN SODIUM (PORCINE) 1000 UNIT/ML IJ SOLN
INTRAMUSCULAR | Status: DC | PRN
  Administered 2020-12-21: 6000 [IU] via INTRAVENOUS

## 2020-12-21 MED ORDER — HEPARIN BOLUS VIA INFUSION
4000.0000 [IU] | Freq: Once | INTRAVENOUS | Status: AC
  Administered 2020-12-21: 4000 [IU] via INTRAVENOUS
  Filled 2020-12-21: qty 4000

## 2020-12-21 MED ORDER — METOPROLOL TARTRATE 5 MG/5ML IV SOLN
5.0000 mg | Freq: Once | INTRAVENOUS | Status: AC
  Administered 2020-12-21: 5 mg via INTRAVENOUS
  Filled 2020-12-21: qty 5

## 2020-12-21 MED ORDER — HEPARIN (PORCINE) IN NACL 1000-0.9 UT/500ML-% IV SOLN
INTRAVENOUS | Status: DC | PRN
  Administered 2020-12-21 (×2): 500 mL

## 2020-12-21 MED ORDER — ACETAMINOPHEN 325 MG PO TABS
650.0000 mg | ORAL_TABLET | ORAL | Status: DC | PRN
  Administered 2020-12-21 – 2020-12-24 (×6): 650 mg via ORAL
  Filled 2020-12-21 (×6): qty 2

## 2020-12-21 MED ORDER — CHLORHEXIDINE GLUCONATE CLOTH 2 % EX PADS
6.0000 | MEDICATED_PAD | Freq: Every day | CUTANEOUS | Status: DC
  Administered 2020-12-21: 6 via TOPICAL

## 2020-12-21 MED ORDER — HYDRALAZINE HCL 50 MG PO TABS
100.0000 mg | ORAL_TABLET | Freq: Two times a day (BID) | ORAL | Status: DC
  Administered 2020-12-21 – 2020-12-22 (×3): 100 mg via ORAL
  Filled 2020-12-21 (×3): qty 2

## 2020-12-21 MED ORDER — HEPARIN (PORCINE) IN NACL 1000-0.9 UT/500ML-% IV SOLN
INTRAVENOUS | Status: AC
  Filled 2020-12-21: qty 1000

## 2020-12-21 MED ORDER — POTASSIUM CHLORIDE CRYS ER 20 MEQ PO TBCR
40.0000 meq | EXTENDED_RELEASE_TABLET | Freq: Two times a day (BID) | ORAL | Status: DC
  Administered 2020-12-21 – 2020-12-22 (×3): 40 meq via ORAL
  Filled 2020-12-21 (×4): qty 2

## 2020-12-21 MED ORDER — IOHEXOL 350 MG/ML SOLN
INTRAVENOUS | Status: DC | PRN
  Administered 2020-12-21: 60 mL

## 2020-12-21 MED ORDER — VERAPAMIL HCL 2.5 MG/ML IV SOLN
INTRAVENOUS | Status: AC
  Filled 2020-12-21: qty 2

## 2020-12-21 MED ORDER — FENTANYL CITRATE (PF) 100 MCG/2ML IJ SOLN
INTRAMUSCULAR | Status: DC | PRN
  Administered 2020-12-21: 25 ug via INTRAVENOUS

## 2020-12-21 MED ORDER — NITROGLYCERIN IN D5W 200-5 MCG/ML-% IV SOLN
0.0000 ug/min | INTRAVENOUS | Status: DC
  Administered 2020-12-21: 5 ug/min via INTRAVENOUS
  Filled 2020-12-21: qty 250

## 2020-12-21 MED ORDER — LIDOCAINE HCL (PF) 1 % IJ SOLN
INTRAMUSCULAR | Status: AC
  Filled 2020-12-21: qty 30

## 2020-12-21 MED ORDER — HYDRALAZINE HCL 20 MG/ML IJ SOLN: 10.0000 mg | INTRAMUSCULAR | Status: AC | PRN

## 2020-12-21 MED ORDER — HEPARIN (PORCINE) 25000 UT/250ML-% IV SOLN
1400.0000 [IU]/h | INTRAVENOUS | Status: DC
  Administered 2020-12-21: 1400 [IU]/h via INTRAVENOUS
  Filled 2020-12-21: qty 250

## 2020-12-21 MED ORDER — ONDANSETRON HCL 4 MG/2ML IJ SOLN
4.0000 mg | Freq: Four times a day (QID) | INTRAMUSCULAR | Status: DC | PRN
  Administered 2020-12-21: 4 mg via INTRAVENOUS
  Filled 2020-12-21: qty 2

## 2020-12-21 MED ORDER — HEPARIN SODIUM (PORCINE) 1000 UNIT/ML IJ SOLN
INTRAMUSCULAR | Status: AC
  Filled 2020-12-21: qty 1

## 2020-12-21 MED ORDER — ATORVASTATIN CALCIUM 80 MG PO TABS
80.0000 mg | ORAL_TABLET | Freq: Every day | ORAL | Status: DC
  Administered 2020-12-21 – 2020-12-24 (×4): 80 mg via ORAL
  Filled 2020-12-21 (×4): qty 1

## 2020-12-21 MED ORDER — MIDAZOLAM HCL 2 MG/2ML IJ SOLN
INTRAMUSCULAR | Status: AC
  Filled 2020-12-21: qty 2

## 2020-12-21 MED ORDER — HEPARIN SODIUM (PORCINE) 5000 UNIT/ML IJ SOLN: 5000.0000 [IU] | Freq: Three times a day (TID) | INTRAMUSCULAR | Status: DC

## 2020-12-21 MED ORDER — FUROSEMIDE 10 MG/ML IJ SOLN
40.0000 mg | Freq: Two times a day (BID) | INTRAMUSCULAR | Status: DC
  Administered 2020-12-21 – 2020-12-23 (×4): 40 mg via INTRAVENOUS
  Filled 2020-12-21 (×4): qty 4

## 2020-12-21 MED ORDER — POTASSIUM CHLORIDE CRYS ER 20 MEQ PO TBCR
40.0000 meq | EXTENDED_RELEASE_TABLET | Freq: Once | ORAL | Status: AC
  Administered 2020-12-21: 40 meq via ORAL

## 2020-12-21 MED ORDER — SODIUM CHLORIDE 0.9% FLUSH
3.0000 mL | Freq: Two times a day (BID) | INTRAVENOUS | Status: DC
  Administered 2020-12-21 – 2020-12-24 (×7): 3 mL via INTRAVENOUS

## 2020-12-21 MED ORDER — CARVEDILOL 3.125 MG PO TABS
3.1250 mg | ORAL_TABLET | Freq: Two times a day (BID) | ORAL | Status: DC
  Administered 2020-12-21 – 2020-12-22 (×2): 3.125 mg via ORAL
  Filled 2020-12-21 (×2): qty 1

## 2020-12-21 MED ORDER — FUROSEMIDE 10 MG/ML IJ SOLN
40.0000 mg | Freq: Once | INTRAMUSCULAR | Status: AC
  Administered 2020-12-21: 40 mg via INTRAVENOUS
  Filled 2020-12-21: qty 4

## 2020-12-21 MED ORDER — RIVAROXABAN 20 MG PO TABS
20.0000 mg | ORAL_TABLET | Freq: Every day | ORAL | Status: DC
  Administered 2020-12-22 – 2020-12-23 (×2): 20 mg via ORAL
  Filled 2020-12-21 (×2): qty 1

## 2020-12-21 MED ORDER — POTASSIUM CHLORIDE CRYS ER 20 MEQ PO TBCR
20.0000 meq | EXTENDED_RELEASE_TABLET | Freq: Two times a day (BID) | ORAL | Status: DC
  Administered 2020-12-21: 20 meq via ORAL
  Filled 2020-12-21: qty 1

## 2020-12-21 MED ORDER — LIDOCAINE HCL (PF) 1 % IJ SOLN
INTRAMUSCULAR | Status: DC | PRN
  Administered 2020-12-21: 2 mL

## 2020-12-21 MED ORDER — SODIUM CHLORIDE 0.9% FLUSH: 3.0000 mL | INTRAVENOUS | Status: DC | PRN

## 2020-12-21 MED ORDER — MIDAZOLAM HCL 2 MG/2ML IJ SOLN
INTRAMUSCULAR | Status: DC | PRN
  Administered 2020-12-21: 2 mg via INTRAVENOUS

## 2020-12-21 MED ORDER — VERAPAMIL HCL 2.5 MG/ML IV SOLN
INTRAVENOUS | Status: DC | PRN
  Administered 2020-12-21: 10 mL via INTRA_ARTERIAL

## 2020-12-21 MED ORDER — ASPIRIN EC 81 MG PO TBEC
81.0000 mg | DELAYED_RELEASE_TABLET | Freq: Every day | ORAL | Status: DC
  Administered 2020-12-22 – 2020-12-24 (×3): 81 mg via ORAL
  Filled 2020-12-21 (×3): qty 1

## 2020-12-21 MED ORDER — ASPIRIN 81 MG PO CHEW
324.0000 mg | CHEWABLE_TABLET | Freq: Once | ORAL | Status: AC
  Administered 2020-12-21: 324 mg via ORAL
  Filled 2020-12-21: qty 4

## 2020-12-21 MED ORDER — FENTANYL CITRATE (PF) 100 MCG/2ML IJ SOLN
INTRAMUSCULAR | Status: AC
  Filled 2020-12-21: qty 2

## 2020-12-21 MED ORDER — SODIUM CHLORIDE 0.9 % IV SOLN: 250.0000 mL | INTRAVENOUS | Status: DC | PRN

## 2020-12-21 MED ORDER — LABETALOL HCL 5 MG/ML IV SOLN: 10.0000 mg | INTRAVENOUS | Status: AC | PRN

## 2020-12-21 SURGICAL SUPPLY — 10 items
CATH 5FR JL3.5 JR4 ANG PIG MP (CATHETERS) ×1 IMPLANT
CATH INFINITI 5FR JL4 (CATHETERS) ×1 IMPLANT
DEVICE RAD COMP TR BAND LRG (VASCULAR PRODUCTS) ×1 IMPLANT
GLIDESHEATH SLEND SS 6F .021 (SHEATH) ×1 IMPLANT
GUIDEWIRE INQWIRE 1.5J.035X260 (WIRE) IMPLANT
INQWIRE 1.5J .035X260CM (WIRE) ×2
KIT HEART LEFT (KITS) ×2 IMPLANT
PACK CARDIAC CATHETERIZATION (CUSTOM PROCEDURE TRAY) ×2 IMPLANT
TRANSDUCER W/STOPCOCK (MISCELLANEOUS) ×2 IMPLANT
TUBING CIL FLEX 10 FLL-RA (TUBING) ×2 IMPLANT

## 2020-12-21 NOTE — Progress Notes (Signed)
ANTICOAGULATION CONSULT NOTE - Initial Consult  Pharmacy Consult for Heparin Indication: chest pain/ACS  No Known Allergies  Patient Measurements: Height: '5\' 11"'$  (180.3 cm) Weight: 129.3 kg (285 lb) IBW/kg (Calculated) : 75.3 Heparin Dosing Weight: 104.7 kg  Vital Signs: Temp: 98.6 F (37 C) (05/06 0620) Temp Source: Oral (05/06 0620) BP: 169/112 (05/06 1040) Pulse Rate: 96 (05/06 1040)  Labs: Recent Labs    12/21/20 0722 12/21/20 0836  HGB 10.2*  --   HCT 34.5*  --   PLT 252  --   CREATININE 1.90*  --   TROPONINIHS 1,954* 2,092*    Estimated Creatinine Clearance: 70.8 mL/min (A) (by C-G formula based on SCr of 1.9 mg/dL (H)).   Medical History: Past Medical History:  Diagnosis Date  . Arrhythmia   . CHF (congestive heart failure) ([place])   . Dysrhythmia    brief episode afib, cardioverted did not return  . Hypertension   . Polysubstance abuse ([name])     Medications:  Scheduled:   Infusions:  . nitroGLYCERIN 45 mcg/min (12/21/20 0923)   PRN: ondansetron (ZOFRAN) IV  Assessment: 40 yo male chronic systolic and diastolic CHF, CKD IIIb, Afib s/p DCCV not taking Xarelto due to inability to affod, hx cocaine use, and severe HTN who presents with 5 days progressive DOE nad cough and now severe chest pain.  Pharmacy is consulted to dose IV heparin.  Goal of Therapy:  Heparin level 0.3-0.7 units/ml Monitor platelets by anticoagulation protocol: Yes   Plan:   Heparin bolus 4000 units IV x 1  Heparin IV infusion 1400 units/hr  Check heparin level 8hrs after starting  Daily heparin level and CBC  Monitor for signs/symptoms of bleeding  Peggyann Juba, PharmD, BCPS Pharmacy: [phone] 12/21/2020,10:57 AM

## 2020-12-21 NOTE — Consult Note (Addendum)
Cardiology Consultation:   Patient ID: AHMON GAIR [name] MRN: [id number]; DOB: 1981-08-15  Admit date: 12/21/2020 Date of Consult: 12/21/2020  Primary Care Provider: Patient, [name] (Inactive) [facility] Cardiologist: Ida Rogue, MD  [facility] Electrophysiologist:  None    Patient Profile:   Gerald [name] is a 40 y.o. male with a hx of DCM (presumed HTN related but has not had ischemic workup in the past), Chronic combined systolic/diastolic CHF,  HTN poorly controlled,  Polysubstance abuse with cocaine and Tobacco and PAF d/p DCCV not on anticoagulation who is being seen today for the evaluation of SOB and chest pain  at the request of Myrene Buddy, MD.  History of Present Illness:   Mr. Gress with a hx of DCM (presumed HTN related but has not had ischemic workup in the past), Chronic combined systolic/diastolic CHF,  HTN poorly controlled,  Polysubstance abuse with cocaine and Tobacco and PAF d/p DCCV in 2020 non compliant with anticoagulation.  His most recent 2D echo showed an EF of 30-35% on 07/05/2020 with trivial MR and AR.  He has had problems with tobacco abuse and continues to smoke 8-9 cig daily.  He also does cocaine intermittently.  He was hospitalized in Nov 2021 with CHF exacerbation in the setting of non compliance with meds and ongoing smoking.  His Entresto, carvedilol, Lasix and spir were restarted and he was restarted on Xarelto.    Unfortunately he has not been able to afford his meds and decided that he would continue only to take his Entresto and Hydralazine because he felt they were the most important.  He has tried to quit smoking but started back again smoking 8-9 cig daily.  Over the past week he has noticed a decline in exercise tolerance and now gets extremely fatigued just walking across the house or walking across a parking lot.  He has also had increased DOE but no LE edema.  Yesterday he went to a ball game and was severely fatigued walking  across the parking lot with severe DOE and felt he had run a marathon.  Over the past 2-3 days started having intermittent chest pain that he describes as sharp and heaviness with pain radiating down his left arm associated with nausea and diaphoresis.  Last night he felt so bad with CP and SOB that he could not lay down to sleep and came to the ER.   In ER he is markedly hypertensive with BP 220/153mHg and tachycardic with HR 106bpm with new LBBB with J point elevation in V3 and V4 of 518mwhich was new from Nov 2021.  Labs showed BNP of 2309 and hsTrop of [id number] He continues to complain of 6/10 CP and appears uncomfortable in the bed.  Cardiology is called for possible STEMI.    Past Medical History:  Diagnosis Date  . Arrhythmia   . [place]  . Dysrhythmia    brief episode afib, cardioverted did not return  . Hypertension   [facility]    Past Surgical History:  Procedure Laterality Date  . KNEE SURGERY Right   . TEE WITHOUT CARDIOVERSION N/A 04/04/2019   Procedure: TRANSESOPHAGEAL ECHOCARDIOGRAM (TEE);  Surgeon: ArWellington HampshireMD;  Location: [facility] ORS;  Service: Cardiovascular;  Laterality: N/A;     Home Medications:  Prior to Admission medications   Medication Sig Start Date End Date Taking? Authorizing Provider  hydrALAZINE (APRESOLINE) 100 MG tablet Take  1 tablet (100 mg total) by mouth 2 (two) times daily. 07/07/20  Yes Little Ishikawa, MD  pseudoephedrine-acetaminophen (TYLENOL SINUS) 30-500 MG TABS tablet Take 1 tablet by mouth every 6 (six) hours as needed (sinus headache).   Yes [provider]  sacubitril-valsartan (ENTRESTO) 24-26 MG Take 1 tablet by mouth 2 (two) times daily. 07/10/20  Yes Visser, Jacquelyn D, PA-C  atorvastatin (LIPITOR) 40 MG tablet Take 1 tablet (40 mg total) by mouth daily. 07/07/20   Little Ishikawa, MD  carvedilol (COREG) 25 MG tablet Take 1 tablet (25 mg total) by mouth 2 (two)  times daily with a meal. 07/07/20 09/05/20  Little Ishikawa, MD  furosemide (LASIX) 20 MG tablet Take 1 tablet (20 mg total) by mouth daily. 07/08/20   Little Ishikawa, MD  potassium chloride SA (K-DUR) 20 MEQ tablet Take 1 tablet (20 mEq total) by mouth daily. 05/06/19 07/10/20  Alisa Graff, FNP  rivaroxaban (XARELTO) 20 MG TABS tablet Take 1 tablet (20 mg total) by mouth daily with supper. 07/07/20   Little Ishikawa, MD  spironolactone (ALDACTONE) 25 MG tablet Take 1 tablet (25 mg total) by mouth daily. 07/08/20   Little Ishikawa, MD    Inpatient Medications: Scheduled Meds: . heparin  4,000 Units Intravenous Once   Continuous Infusions: . heparin    . nitroGLYCERIN 45 mcg/min (12/21/20 0923)   PRN Meds: ondansetron (ZOFRAN) IV  Allergies:   No Known Allergies  Social History:   Social History   Socioeconomic History  . Marital status: Single    Spouse name: Not on file  . Number of children: Not on file  . Years of education: Not on file  . Highest education level: Not on file  Occupational History  . Not on file  Tobacco Use  . Smoking status: Current Every Day Smoker    Packs/day: 1.00    Types: Cigarettes  . Smokeless tobacco: Never Used  Vaping Use  . Vaping Use: Never used  Substance and Sexual Activity  . Alcohol use: Yes    Comment: occas.   . Drug use: Yes    Types: Cocaine, Marijuana  . Sexual activity: Not on file  Other Topics Concern  . Not on file  Social History Narrative  . Not on file   Social Determinants of Health   Financial Resource Strain: Not on file  Food Insecurity: Not on file  Transportation Needs: Not on file  Physical Activity: Not on file  Stress: Not on file  Social Connections: Not on file  Intimate Partner Violence: Not on file    Family History:    Family History  Problem Relation Age of Onset  . Hypertension Father   . Heart disease Paternal Grandfather      ROS:  Please see the history of  present illness.   All other ROS reviewed and negative.     Physical Exam/Data:   Vitals:   12/21/20 1030 12/21/20 1040 12/21/20 1100 12/21/20 1102  BP: (!) 184/117 (!) 169/112 (!) 159/119   Pulse: 94 96 95 96  Resp: 14 (!) 23 (!) 49 (!) 38  Temp:      TempSrc:      SpO2: 96% 94% (!) 89% 94%  Weight:      Height:        Intake/Output Summary (Last 24 hours) at 12/21/2020 1107 Last data filed at 12/21/2020 0836 Gross per 24 hour  Intake --  Output 850 ml  Net -850 ml   Last 3 Weights 12/21/2020 07/10/2020 07/07/2020  Weight (lbs) 285 lb 243 lb 6 oz 240 lb 0.2 oz  Weight (kg) 129.275 kg 110.394 kg 108.87 kg     Body mass index is 39.75 kg/m.  General:  Well nourished, well developed, in moderate distress due to SOB and chest pain HEENT: normal Lymph: no adenopathy Neck: no JVD Endocrine:  No thryomegaly Vascular: No carotid bruits; FA pulses 2+ bilaterally without bruits  Cardiac:  normal S1, S2; RRR; no murmur  Lungs:  clear to auscultation bilaterally, no wheezing, rhonchi or rales  Abd: soft, nontender, no hepatomegaly  Ext: no edema Musculoskeletal:  No deformities, BUE and BLE strength normal and equal Skin: warm and dry  Neuro:  CNs 2-12 intact, no focal abnormalities noted Psych:  Normal affect   EKG:  The EKG was personally reviewed and demonstrates:  NSR with new LBBB and 47m of J point elevation in V3-V4 Telemetry:  Telemetry was personally reviewed and demonstrates:  NSR  Relevant CV Studies: 2D echo 07/05/2020 IMPRESSIONS   1. Left ventricular ejection fraction, by estimation, is 30 to 35%. The  left ventricle has moderately decreased function. The left ventricle  demonstrates global hypokinesis. The left ventricular internal cavity size  was mildly dilated. There is severe  left ventricular hypertrophy. Left ventricular diastolic parameters are  consistent with [name] diastolic dysfunction (pseudonormalization).  Elevated left atrial pressure.  2.  Right ventricular systolic function is normal. The right ventricular  size is normal. Tricuspid regurgitation signal is inadequate for assessing  PA pressure.  3. Left atrial size was severely dilated.  4. The mitral valve is normal in structure. Trivial mitral valve  regurgitation.  5. The aortic valve is tricuspid. Aortic valve regurgitation is trivial.  No aortic stenosis is present.  6. Aortic dilatation noted. There is mild dilatation of the aortic root,  measuring 40 mm. There is mild dilatation of the ascending aorta,  measuring 39 mm.  7. The inferior vena cava is dilated in size with >50% respiratory  variability, suggesting right atrial pressure of 8 mmHg.   Laboratory Data:  High Sensitivity Troponin:   Recent Labs  Lab 12/21/20 0722 12/21/20 0836  TROPONINIHS 1,954* 2,092*     Chemistry Recent Labs  Lab 12/21/20 0722  NA 139  K 3.0*  CL 105  CO2 24  GLUCOSE 113*  BUN 30*  CREATININE 1.90*  CALCIUM 8.6*  GFRNONAA 45*  ANIONGAP 10    No results for input(s): PROT, ALBUMIN, AST, ALT, ALKPHOS, BILITOT in the last 168 hours. Hematology Recent Labs  Lab 12/21/20 0722  WBC 7.7  RBC 4.98  HGB 10.2*  HCT 34.5*  MCV 69.3*  MCH 20.5*  MCHC 29.6*  RDW 17.7*  PLT 252   BNP Recent Labs  Lab 12/21/20 0722  BNP 2,309.0*    DDimer No results for input(s): DDIMER in the last 168 hours.   Radiology/Studies:  DG Chest 2 View  Result Date: 12/21/2020 CLINICAL DATA:  Shortness of breath. Hypertension, systolic CHF, renal insufficiency, cough and congestion. EXAM: CHEST - 2 VIEW COMPARISON:  July 05, 2020. FINDINGS: Enlarged cardiac silhouette, similar to prior. Pulmonary vascular congestion. Hazy diffuse opacities with more confluent airspace opacity at the bases. No visible pleural effusions or pneumothorax. No acute osseous abnormality. IMPRESSION: Cardiomegaly and pulmonary vascular congestion with hazy bilateral opacities and more confluent airspace  opacities at the bases. Findings are favored to reflect CHF with alveolar edema, although  infection at the lung bases is not excluded. Findings are similar to the prior from July 05, 2020. Electronically Signed   By: Margaretha Sheffield MD   On: 12/21/2020 07:50     Assessment and Plan:   1. Chest pain/new LBBB/Possible anterior STEMI -he presents with 2-3 day hx of intermittent chest pain with radiation into the L arm, severe exertional fatigue, DOE and new LBBB on EKG with 22m of J point elevation in V3-V4 -Still complaining of CP -hsTrop 1954 and 2092 (was in the 600s in Nov 2021 at time of CHF exacerbation) -unclear in setting of new LBBB if this is a STEMI but does have J point elevation in V3 and V4 that meets STEMI criteria -he is markedly hypertensive but has no back pain, no widened mediastinum on Cxary and good equal pedal pulses so do not think this is acute aortic dissection.  Given AKI would avoid Chest CTA as he will need contrast for cath -This could be demand ischemia in the setting of hypertensive urgency with cocaine use (drug screen + for cocaine) - needs aggressive control of BP - start IV NTG gtt and titrate to keep SBP < 1656mg - continue IV Heparin gtt - he has already gotten IV Lopressor - will restart Carvedilol 3.'125mg'$  BID given his marked HTN - hold Entresto due to AKI and need for cath - Shared Decision Making/Informed Consent The risks [stroke (1 in 1000), death (1 in 1000), kidney failure [usually temporary] (1 in 500), bleeding (1 in 200), allergic reaction [possibly serious] (1 in 200)], benefits (diagnostic support and management of coronary artery disease) and alternatives of a cardiac catheterization were discussed in detail with Mr. ByLipend he is willing to proceed.  2.  DCM/Acute combined acute/diastolic CHF  -EF 3099991111y echo 06/2020 -felt likely hypertensive in origin but has not had an ischemic workup which was recommended outpt but he never  followed through -noncompliant with HF meds except for Entresto and Hydralazine -now with elevated GNP at 2300 and Cxray c/w CHF -exacerbated by hypertensive urgency and noncompliance with HF meds -currently NYHA class IV -continue Hydralazine '100mg'$  TID restart Carvedilol 3.'125mg'$  BID given need to get BP down in setting of limited options with AKI -continue IV NTG gtt -hold Entresto due to AKI and need for cath -will restart Entresto and spiro post cath when renal function improves.  -will need AHF nurse navigator to see about issues with affording meds -diuretics will be dosed based on LVEDP at cath  3.  Polysubstance abuse -continues to abuse cocaine and tobacco -needs smoking and drug cessation counseling  4.  Hypertensive urgency -likely related to noncompliance with BP meds >>he stopped taking his Carvedilol and diuretic but was still taking Entresto and Hydralazine -hold Entresto due to AKI -continue Hydralazine '100mg'$  TID -starting Carvediol 3.'125mg'$  BID and titrate for HTN>>options for BP control are limited in setting of AKI so will start BB despite CHF exacerbation -continue to titrate NTG for BP control  5.  Hypokalemia -K+ 3 -will replete K+to keep > 4. -mag normal at 2  6.  AKI on CKD stage 3a -SCr was 1.61 on admit last Nov and at discharge was 1.86 -Scr stable at 1.9 today -holding Entresto for now for cath -follow renal function closely post cath  7.  PAF -s/p DCCV in 2020 -maintaining NSR on EKG -restart Xarelto post cath -restarting BB    TIMI Risk Score for ST  Elevation MI:   The patient's  TIMI risk score is 7, which indicates a 23.4% risk of all cause mortality at 30 days. \  New York Heart Association (NYHA) Functional Class NYHA Class IV  CHA2DS2-VASc Score = 2   This indicates a 2.2% annual risk of stroke. The patient's score is based upon: CHF History: Yes HTN History: Yes Diabetes History: No Stroke History: No Vascular Disease History:  No Age Score: 0 Gender Score: 0   The patient is critically ill with multiple organ systems failure and requires high complexity decision making for assessment and support, frequent evaluation and titration of therapies, application of advanced monitoring technologies and extensive interpretation of multiple databases. Critical Care Time devoted to patient care services described in this note independent of APP time is 60 minutes with >50% of time spent in direct patient care.     For questions or updates, please contact [place] Please consult [email or web address] for contact info under    Signed, Fransico Him, MD  12/21/2020 11:07 AM

## 2020-12-21 NOTE — H&P (View-Only) (Signed)
Cardiology Consultation:   Patient ID: Gerald [name] MRN: [id number]; DOB: 12-12-1980  Admit date: 12/21/2020 Date of Consult: 12/21/2020  Primary Care Provider: Patient, [name] (Inactive) [facility] Cardiologist: Ida Rogue, MD  [facility] Electrophysiologist:  None    Patient Profile:   Gerald [name] is a 40 y.o. male with a hx of DCM (presumed HTN related but has not had ischemic workup in the past), Chronic combined systolic/diastolic CHF,  HTN poorly controlled,  Polysubstance abuse with cocaine and Tobacco and PAF d/p DCCV not on anticoagulation who is being seen today for the evaluation of SOB and chest pain  at the request of Myrene Buddy, MD.  History of Present Illness:   Gerald [name] with a hx of DCM (presumed HTN related but has not had ischemic workup in the past), Chronic combined systolic/diastolic CHF,  HTN poorly controlled,  Polysubstance abuse with cocaine and Tobacco and PAF d/p DCCV in 2020 non compliant with anticoagulation.  His most recent 2D echo showed an EF of 30-35% on 07/05/2020 with trivial MR and AR.  He has had problems with tobacco abuse and continues to smoke 8-9 cig daily.  He also does cocaine intermittently.  He was hospitalized in Nov 2021 with CHF exacerbation in the setting of non compliance with meds and ongoing smoking.  His Entresto, carvedilol, Lasix and spir were restarted and he was restarted on Xarelto.    Unfortunately he has not been able to afford his meds and decided that he would continue only to take his Entresto and Hydralazine because he felt they were the most important.  He has tried to quit smoking but started back again smoking 8-9 cig daily.  Over the past week he has noticed a decline in exercise tolerance and now gets extremely fatigued just walking across the house or walking across a parking lot.  He has also had increased DOE but no LE edema.  Yesterday he went to a ball game and was severely fatigued walking  across the parking lot with severe DOE and felt he had run a marathon.  Over the past 2-3 days started having intermittent chest pain that he describes as sharp and heaviness with pain radiating down his left arm associated with nausea and diaphoresis.  Last night he felt so bad with CP and SOB that he could not lay down to sleep and came to the ER.   In ER he is markedly hypertensive with BP 220/186mHg and tachycardic with HR 106bpm with new LBBB with J point elevation in V3 and V4 of 528mwhich was new from Nov 2021.  Labs showed BNP of 2309 and hsTrop of [id number] He continues to complain of 6/10 CP and appears uncomfortable in the bed.  Cardiology is called for possible STEMI.    Past Medical History:  Diagnosis Date  . Arrhythmia   . CHF (congestive heart failure) ([employer]  . Dysrhythmia    brief episode afib, cardioverted did not return  . Hypertension   . [facility]    Past Surgical History:  Procedure Laterality Date  . KNEE SURGERY Right   . TEE WITHOUT CARDIOVERSION N/A 04/04/2019   Procedure: TRANSESOPHAGEAL ECHOCARDIOGRAM (TEE);  Surgeon: ArWellington HampshireMD;  Location: [facility] ORS;  Service: Cardiovascular;  Laterality: N/A;     Home Medications:  Prior to Admission medications   Medication Sig Start Date End Date Taking? Authorizing Provider  hydrALAZINE (APRESOLINE) 100 MG tablet Take  1 tablet (100 mg total) by mouth 2 (two) times daily. 07/07/20  Yes Little Ishikawa, MD  pseudoephedrine-acetaminophen (TYLENOL SINUS) 30-500 MG TABS tablet Take 1 tablet by mouth every 6 (six) hours as needed (sinus headache).   Yes [provider]  sacubitril-valsartan (ENTRESTO) 24-26 MG Take 1 tablet by mouth 2 (two) times daily. 07/10/20  Yes Visser, Jacquelyn D, PA-C  atorvastatin (LIPITOR) 40 MG tablet Take 1 tablet (40 mg total) by mouth daily. 07/07/20   Little Ishikawa, MD  carvedilol (COREG) 25 MG tablet Take 1 tablet (25 mg total) by mouth 2 (two)  times daily with a meal. 07/07/20 09/05/20  Little Ishikawa, MD  furosemide (LASIX) 20 MG tablet Take 1 tablet (20 mg total) by mouth daily. 07/08/20   Little Ishikawa, MD  potassium chloride SA (K-DUR) 20 MEQ tablet Take 1 tablet (20 mEq total) by mouth daily. 05/06/19 07/10/20  Alisa Graff, FNP  rivaroxaban (XARELTO) 20 MG TABS tablet Take 1 tablet (20 mg total) by mouth daily with supper. 07/07/20   Little Ishikawa, MD  spironolactone (ALDACTONE) 25 MG tablet Take 1 tablet (25 mg total) by mouth daily. 07/08/20   Little Ishikawa, MD    Inpatient Medications: Scheduled Meds: . heparin  4,000 Units Intravenous Once   Continuous Infusions: . heparin    . nitroGLYCERIN 45 mcg/min (12/21/20 0923)   PRN Meds: ondansetron (ZOFRAN) IV  Allergies:   No Known Allergies  Social History:   Social History   Socioeconomic History  . Marital status: Single    Spouse name: Not on file  . Number of children: Not on file  . Years of education: Not on file  . Highest education level: Not on file  Occupational History  . Not on file  Tobacco Use  . Smoking status: Current Every Day Smoker    Packs/day: 1.00    Types: Cigarettes  . Smokeless tobacco: Never Used  Vaping Use  . Vaping Use: Never used  Substance and Sexual Activity  . Alcohol use: Yes    Comment: occas.   . Drug use: Yes    Types: Cocaine, Marijuana  . Sexual activity: Not on file  Other Topics Concern  . Not on file  Social History Narrative  . Not on file   Social Determinants of Health   Financial Resource Strain: Not on file  Food Insecurity: Not on file  Transportation Needs: Not on file  Physical Activity: Not on file  Stress: Not on file  Social Connections: Not on file  Intimate Partner Violence: Not on file    Family History:    Family History  Problem Relation Age of Onset  . Hypertension Father   . Heart disease Paternal Grandfather      ROS:  Please see the history of  present illness.   All other ROS reviewed and negative.     Physical Exam/Data:   Vitals:   12/21/20 1030 12/21/20 1040 12/21/20 1100 12/21/20 1102  BP: (!) 184/117 (!) 169/112 (!) 159/119   Pulse: 94 96 95 96  Resp: 14 (!) 23 (!) 49 (!) 38  Temp:      TempSrc:      SpO2: 96% 94% (!) 89% 94%  Weight:      Height:        Intake/Output Summary (Last 24 hours) at 12/21/2020 1107 Last data filed at 12/21/2020 0836 Gross per 24 hour  Intake --  Output 850 ml  Net -850 ml   Last 3 Weights 12/21/2020 07/10/2020 07/07/2020  Weight (lbs) 285 lb 243 lb 6 oz 240 lb 0.2 oz  Weight (kg) 129.275 kg 110.394 kg 108.87 kg     Body mass index is 39.75 kg/m.  General:  Well nourished, well developed, in moderate distress due to SOB and chest pain HEENT: normal Lymph: no adenopathy Neck: no JVD Endocrine:  No thryomegaly Vascular: No carotid bruits; FA pulses 2+ bilaterally without bruits  Cardiac:  normal S1, S2; RRR; no murmur  Lungs:  clear to auscultation bilaterally, no wheezing, rhonchi or rales  Abd: soft, nontender, no hepatomegaly  Ext: no edema Musculoskeletal:  No deformities, BUE and BLE strength normal and equal Skin: warm and dry  Neuro:  CNs 2-12 intact, no focal abnormalities noted Psych:  Normal affect   EKG:  The EKG was personally reviewed and demonstrates:  NSR with new LBBB and 64m of J point elevation in V3-V4 Telemetry:  Telemetry was personally reviewed and demonstrates:  NSR  Relevant CV Studies: 2D echo 07/05/2020 IMPRESSIONS   1. Left ventricular ejection fraction, by estimation, is 30 to 35%. The  left ventricle has moderately decreased function. The left ventricle  demonstrates global hypokinesis. The left ventricular internal cavity size  was mildly dilated. There is severe  left ventricular hypertrophy. Left ventricular diastolic parameters are  consistent with [name] diastolic dysfunction (pseudonormalization).  Elevated left atrial pressure.  2.  Right ventricular systolic function is normal. The right ventricular  size is normal. Tricuspid regurgitation signal is inadequate for assessing  PA pressure.  3. Left atrial size was severely dilated.  4. The mitral valve is normal in structure. Trivial mitral valve  regurgitation.  5. The aortic valve is tricuspid. Aortic valve regurgitation is trivial.  No aortic stenosis is present.  6. Aortic dilatation noted. There is mild dilatation of the aortic root,  measuring 40 mm. There is mild dilatation of the ascending aorta,  measuring 39 mm.  7. The inferior vena cava is dilated in size with >50% respiratory  variability, suggesting right atrial pressure of 8 mmHg.   Laboratory Data:  High Sensitivity Troponin:   Recent Labs  Lab 12/21/20 0722 12/21/20 0836  TROPONINIHS 1,954* 2,092*     Chemistry Recent Labs  Lab 12/21/20 0722  NA 139  K 3.0*  CL 105  CO2 24  GLUCOSE 113*  BUN 30*  CREATININE 1.90*  CALCIUM 8.6*  GFRNONAA 45*  ANIONGAP 10    No results for input(s): PROT, ALBUMIN, AST, ALT, ALKPHOS, BILITOT in the last 168 hours. Hematology Recent Labs  Lab 12/21/20 0722  WBC 7.7  RBC 4.98  HGB 10.2*  HCT 34.5*  MCV 69.3*  MCH 20.5*  MCHC 29.6*  RDW 17.7*  PLT 252   BNP Recent Labs  Lab 12/21/20 0722  BNP 2,309.0*    DDimer No results for input(s): DDIMER in the last 168 hours.   Radiology/Studies:  DG Chest 2 View  Result Date: 12/21/2020 CLINICAL DATA:  Shortness of breath. Hypertension, systolic CHF, renal insufficiency, cough and congestion. EXAM: CHEST - 2 VIEW COMPARISON:  July 05, 2020. FINDINGS: Enlarged cardiac silhouette, similar to prior. Pulmonary vascular congestion. Hazy diffuse opacities with more confluent airspace opacity at the bases. No visible pleural effusions or pneumothorax. No acute osseous abnormality. IMPRESSION: Cardiomegaly and pulmonary vascular congestion with hazy bilateral opacities and more confluent airspace  opacities at the bases. Findings are favored to reflect CHF with alveolar edema, although  infection at the lung bases is not excluded. Findings are similar to the prior from July 05, 2020. Electronically Signed   By: Margaretha Sheffield MD   On: 12/21/2020 07:50     Assessment and Plan:   1. Chest pain/new LBBB/Possible anterior STEMI -he presents with 2-3 day hx of intermittent chest pain with radiation into the L arm, severe exertional fatigue, DOE and new LBBB on EKG with 30m of J point elevation in V3-V4 -Still complaining of CP -hsTrop 1954 and 2092 (was in the 600s in Nov 2021 at time of CHF exacerbation) -unclear in setting of new LBBB if this is a STEMI but does have J point elevation in V3 and V4 that meets STEMI criteria -he is markedly hypertensive but has no back pain, no widened mediastinum on Cxary and good equal pedal pulses so do not think this is acute aortic dissection.  Given AKI would avoid Chest CTA as he will need contrast for cath -This could be demand ischemia in the setting of hypertensive urgency with cocaine use (drug screen + for cocaine) - needs aggressive control of BP - start IV NTG gtt and titrate to keep SBP < 1631mg - continue IV Heparin gtt - he has already gotten IV Lopressor - will restart Carvedilol 3.'125mg'$  BID given his marked HTN - hold Entresto due to AKI and need for cath - Shared Decision Making/Informed Consent The risks [stroke (1 in 1000), death (1 in 1000), kidney failure [usually temporary] (1 in 500), bleeding (1 in 200), allergic reaction [possibly serious] (1 in 200)], benefits (diagnostic support and management of coronary artery disease) and alternatives of a cardiac catheterization were discussed in detail with Mr. ByLarand he is willing to proceed.  2.  DCM/Acute combined acute/diastolic CHF  -EF 3099991111y echo 06/2020 -felt likely hypertensive in origin but has not had an ischemic workup which was recommended outpt but he never  followed through -noncompliant with HF meds except for Entresto and Hydralazine -now with elevated GNP at 2300 and Cxray c/w CHF -exacerbated by hypertensive urgency and noncompliance with HF meds -currently NYHA class IV -continue Hydralazine '100mg'$  TID restart Carvedilol 3.'125mg'$  BID given need to get BP down in setting of limited options with AKI -continue IV NTG gtt -hold Entresto due to AKI and need for cath -will restart Entresto and spiro post cath when renal function improves.  -will need AHF nurse navigator to see about issues with affording meds -diuretics will be dosed based on LVEDP at cath  3.  Polysubstance abuse -continues to abuse cocaine and tobacco -needs smoking and drug cessation counseling  4.  Hypertensive urgency -likely related to noncompliance with BP meds >>he stopped taking his Carvedilol and diuretic but was still taking Entresto and Hydralazine -hold Entresto due to AKI -continue Hydralazine '100mg'$  TID -starting Carvediol 3.'125mg'$  BID and titrate for HTN>>options for BP control are limited in setting of AKI so will start BB despite CHF exacerbation -continue to titrate NTG for BP control  5.  Hypokalemia -K+ 3 -will replete K+to keep > 4. -mag normal at 2  6.  AKI on CKD stage 3a -SCr was 1.61 on admit last Nov and at discharge was 1.86 -Scr stable at 1.9 today -holding Entresto for now for cath -follow renal function closely post cath  7.  PAF -s/p DCCV in 2020 -maintaining NSR on EKG -restart Xarelto post cath -restarting BB    TIMI Risk Score for ST  Elevation MI:   The patient's  TIMI risk score is 7, which indicates a 23.4% risk of all cause mortality at 30 days. \  New York Heart Association (NYHA) Functional Class NYHA Class IV  CHA2DS2-VASc Score = 2   This indicates a 2.2% annual risk of stroke. The patient's score is based upon: CHF History: Yes HTN History: Yes Diabetes History: No Stroke History: No Vascular Disease History:  No Age Score: 0 Gender Score: 0   The patient is critically ill with multiple organ systems failure and requires high complexity decision making for assessment and support, frequent evaluation and titration of therapies, application of advanced monitoring technologies and extensive interpretation of multiple databases. Critical Care Time devoted to patient care services described in this note independent of APP time is 60 minutes with >50% of time spent in direct patient care.     For questions or updates, please contact [place] Please consult [email or web address] for contact info under    Signed, Fransico Him, MD  12/21/2020 11:07 AM

## 2020-12-21 NOTE — Interval H&P Note (Signed)
History and Physical Interval Note:  12/21/2020 1:25 PM  Gerald [name]  has presented today for surgery, with the diagnosis of nstemi.  The various methods of treatment have been discussed with the patient and family. After consideration of risks, benefits and other options for treatment, the patient has consented to  Procedure(s): LEFT HEART CATH AND CORONARY ANGIOGRAPHY (N/A) as a surgical intervention.  The patient's history has been reviewed, patient examined, no change in status, stable for surgery.  I have reviewed the patient's chart and labs.  Questions were answered to the patient's satisfaction.   Cath Lab Visit (complete for each Cath Lab visit)  Clinical Evaluation Leading to the Procedure:   ACS: Yes.    Non-ACS:    Anginal Classification: CCS IV  Anti-ischemic medical therapy: Minimal Therapy (1 class of medications)  Non-Invasive Test Results: No non-invasive testing performed  Prior CABG: No previous CABG        Collier Salina [facility] 12/21/2020 1:25 PM

## 2020-12-21 NOTE — H&P (Addendum)
History and Physical  Patient Name: Gerald [name]     [id number]    DOB: 07-15-1981    DOA: 12/21/2020 PCP: Patient, [name] (Inactive)  Patient coming from: Home  Chief Complaint: Chest pain      HPI: Gerald [name] is a 40 y.o. M with hx chronic systolic and diastolic CHF EF [id number], CKD IIIb, hx transient Afib s/p DCCV not taking AC, hx cocaine use, and severe HTN who presents with 5 days progressive DOE nad cough and now severe chest pain.  Patient was in his usual state of health until 1 week ago when he started to notice increased fatigue/shortness of breath with exertion, chest tightness with exertion, and cough as well as abdominal swelling, orthopnea.  He is normally able to walk half a mile or more without symptoms, but the last week he has gotten to the point where he is out of breath with walking 50 feet.  Then this morning he developed severe substernal chest pain.  He drove himself to the ER, but was doubled over in tears with chest pain on ambulating to the door.  In the ER, ECG showed a new left bundle branch block, high sensitivity troponin 2000. BP 220/120 initially.  Electrolytes notable for K 3.0 and Cr 1.9 (recent baseline around 2).  CXR showed pulmonary edema.  COVID negative.  He was started on nitroglycerin, given metoprolol and Lasix and potassium and the hospitalist service were asked to evaluate.           ROS: Review of Systems  Constitutional: Negative for chills, fever and malaise/fatigue.  Respiratory: Positive for cough, hemoptysis and shortness of breath. Negative for sputum production and wheezing.   Cardiovascular: Positive for chest pain, palpitations and orthopnea. Negative for claudication, leg swelling and PND.  Gastrointestinal: Positive for nausea.  Genitourinary: Negative for dysuria and urgency.  Neurological: Negative for loss of consciousness.  All other systems reviewed and are negative.         Past Medical History:   Diagnosis Date  . Arrhythmia    atrial fibrillation  . CHF (congestive heart failure) ([place])   . Hypertension   . Polysubstance abuse [facility])     Past Surgical History:  Procedure Laterality Date  . KNEE SURGERY Right   . TEE WITHOUT CARDIOVERSION N/A 04/04/2019   Procedure: TRANSESOPHAGEAL ECHOCARDIOGRAM (TEE);  Surgeon: [name], MD;  Location: [facility] ORS;  Service: Cardiovascular;  Laterality: N/A;    Social History: Patient lives alone.  Works full time.  The patient walks unassisted.  Smoker.  Not habitual user of cocaine, but some powder used 1 week ago.  Alcohol sporadic but heavy when used.  No Known Allergies  Family history: family history includes Hypertension in his father.  Grandfather with unknown heart disease, unknown brain tumor.  Prior to Admission medications   Medication Sig Start Date End Date Taking? Authorizing Provider  hydrALAZINE (APRESOLINE) 100 MG tablet Take 1 tablet (100 mg total) by mouth 2 (two) times daily. 07/07/20  Yes Little Ishikawa, MD  pseudoephedrine-acetaminophen (TYLENOL SINUS) 30-500 MG TABS tablet Take 1 tablet by mouth every 6 (six) hours as needed (sinus headache).   Yes [provider]  sacubitril-valsartan (ENTRESTO) 24-26 MG Take 1 tablet by mouth 2 (two) times daily. 07/10/20  Yes Visser, Jacquelyn D, PA-C  atorvastatin (LIPITOR) 40 MG tablet Take 1 tablet (40 mg total) by mouth daily. 07/07/20   Little Ishikawa, MD  carvedilol (COREG)  25 MG tablet Take 1 tablet (25 mg total) by mouth 2 (two) times daily with a meal. 07/07/20 09/05/20  Little Ishikawa, MD  furosemide (LASIX) 20 MG tablet Take 1 tablet (20 mg total) by mouth daily. 07/08/20   Little Ishikawa, MD  potassium chloride SA (K-DUR) 20 MEQ tablet Take 1 tablet (20 mEq total) by mouth daily. 05/06/19 07/10/20  Alisa Graff, FNP  rivaroxaban (XARELTO) 20 MG TABS tablet Take 1 tablet (20 mg total) by mouth daily with supper. 07/07/20   Little Ishikawa, MD  spironolactone (ALDACTONE) 25 MG tablet Take 1 tablet (25 mg total) by mouth daily. 07/08/20   Little Ishikawa, MD       Physical Exam: BP (!) 154/108   Pulse 96   Temp 98.6 F (37 C) (Oral)   Resp (!) 28   Ht '5\' 11"'$  (1.803 m)   Wt 129.3 kg   SpO2 93%   BMI 39.75 kg/m  General appearance: Well-developed, adult male, alert and in moderate distress from pain and nausea.   Eyes: Anicteric, conjunctiva pink, lids and lashes normal. PERRL.    ENT: No nasal deformity, discharge, epistaxis.  Hearing normal. OP dry without lesions.  Dentiion in good repair, lips normal Neck: No neck masses.  Trachea midline.  No thyromegaly/tenderness. Lymph: No cervical or supraclavicular lymphadenopathy. Skin: Warm and dry.  No jaundice.  No suspicious rashes or lesions. Cardiac: Tachycardic, regular, nl S1-S2, no murmurs appreciated.  Capillary refill is brisk.  JVP elevated above the clavicle.  No LE edema at all.  Radial and DP pulses 2+ and symmetric. Respiratory: Normal respiratory rate and rhythm.  No wheezing, and I actually do not appreciate rales. Abdomen: Abdomen soft.  No TTP or guarding. No ascites, distension, hepatosplenomegaly.   MSK: No deformities or effusions of the large joints of the upper or lower extremities bilaterally.  No cyanosis or clubbing. Neuro: Cranial nerves normal.  Sensation intact to light touch. Speech is fluent.  Muscle strength normal.    Psych: Sensorium intact and responding to questions, attention normal.  Behavior appropriate.  Affect normal.  Judgment and insight appear normal.     Labs on Admission:  I have personally reviewed following labs and imaging studies: CBC: Recent Labs  Lab 12/21/20 0722  WBC 7.7  NEUTROABS 5.8  HGB 10.2*  HCT 34.5*  MCV 69.3*  PLT [id number]   Basic Metabolic Panel: Recent Labs  Lab 12/21/20 0722  NA 139  K 3.0*  CL 105  CO2 24  GLUCOSE 113*  BUN 30*  CREATININE 1.90*  CALCIUM 8.6*  MG 2.1    GFR: Estimated Creatinine Clearance: 70.8 mL/min (A) (by C-G formula based on SCr of 1.9 mg/dL (H)).    Recent Results (from the past 240 hour(s))  Resp Panel by RT-PCR (Flu A&B, Covid) Nasopharyngeal Swab     Status: None   Collection Time: 12/21/20  7:17 AM   Specimen: Nasopharyngeal Swab; Nasopharyngeal(NP) swabs in vial transport medium  Result Value Ref Range Status   SARS Coronavirus 2 by RT PCR NEGATIVE NEGATIVE Final    Comment: (NOTE) SARS-CoV-2 target nucleic acids are NOT DETECTED.  The SARS-CoV-2 RNA is generally detectable in upper respiratory specimens during the acute phase of infection. The lowest concentration of SARS-CoV-2 viral copies this assay can detect is 138 copies/mL. A negative result does not preclude SARS-Cov-2 infection and should not be used as the sole basis for treatment or other patient management decisions.  A negative result may occur with  improper specimen collection/handling, submission of specimen other than nasopharyngeal swab, presence of viral mutation(s) within the areas targeted by this assay, and inadequate number of viral copies(<138 copies/mL). A negative result must be combined with clinical observations, patient history, and epidemiological information. The expected result is Negative.  Fact Sheet for Patients:  EntrepreneurPulse.com.au  Fact Sheet for Healthcare Providers:  IncredibleEmployment.be  This test is no t yet approved or cleared by the [place] FDA and  has been authorized for detection and/or diagnosis of SARS-CoV-2 by FDA under an Emergency Use Authorization (EUA). This EUA will remain  in effect (meaning this test can be used) for the duration of the COVID-19 declaration under Section 564(b)(1) of the Act, 21 U.S.C.section 360bbb-3(b)(1), unless the authorization is terminated  or revoked sooner.       Influenza A by PCR NEGATIVE NEGATIVE Final   Influenza B by PCR  NEGATIVE NEGATIVE Final    Comment: (NOTE) The Xpert Xpress SARS-CoV-2/FLU/RSV plus assay is intended as an aid in the diagnosis of influenza from Nasopharyngeal swab specimens and should not be used as a sole basis for treatment. Nasal washings and aspirates are unacceptable for Xpert Xpress SARS-CoV-2/FLU/RSV testing.  Fact Sheet for Patients: EntrepreneurPulse.com.au  Fact Sheet for Healthcare Providers: IncredibleEmployment.be  This test is not yet approved or cleared by the [place] FDA and has been authorized for detection and/or diagnosis of SARS-CoV-2 by FDA under an Emergency Use Authorization (EUA). This EUA will remain in effect (meaning this test can be used) for the duration of the COVID-19 declaration under Section 564(b)(1) of the Act, 21 U.S.C. section 360bbb-3(b)(1), unless the authorization is terminated or revoked.  Performed at [facility], [place] [address], [place], [place] [address]            Radiological Exams on Admission: Personally reviewed CXR shows pulmonnary edema, Cardiomegaly: DG Chest 2 View  Result Date: 12/21/2020 CLINICAL DATA:  Shortness of breath. Hypertension, systolic CHF, renal insufficiency, cough and congestion. EXAM: CHEST - 2 VIEW COMPARISON:  July 05, 2020. FINDINGS: Enlarged cardiac silhouette, similar to prior. Pulmonary vascular congestion. Hazy diffuse opacities with more confluent airspace opacity at the bases. No visible pleural effusions or pneumothorax. No acute osseous abnormality. IMPRESSION: Cardiomegaly and pulmonary vascular congestion with hazy bilateral opacities and more confluent airspace opacities at the bases. Findings are favored to reflect CHF with alveolar edema, although infection at the lung bases is not excluded. Findings are similar to the prior from July 05, 2020. Electronically Signed   By: Margaretha Sheffield MD   On: 12/21/2020 07:50    EKG:  Independently reviewed. New LBBB, sinus tachycardia. Reviewed with Dr. Radford Pax.       Assessment/Plan    Hypertensive emergency Troponin elevation  Discussed with Cardiology, do not suspect STEMI at this time, more likely his troponin leak is from hypertensive emergency, but will take today for LHC  -Continue nitro drip, goal BP reduction 25% in next 24 hours, goal 160/110 for now -Resume home hydralazine, carvedilol -Hold home Entresto and Spironolactone per Cardiology   -Start heparin gtt -Aspirin 325 now once then continue 81 daily -Defer Plavix/Bilinta to Cardiology -Resume home statin, increase dose to 80 daily  -Transfer to [facility] ICU    Acute on chronic systolic and diastolic CHF Has edema on CXR, dyspnea on exertion, elevated BNP, cardiomegaly on CXR.    -Furosemide 40 mg IV twice a day  -K supplement -Strict I/Os,  daily weights, telemetry  -Daily monitoring renal function -Echo done last Nov shows EF 30%.  Defer repeat Echo to Cardiology post-cath     Essential hypertension Supposed to be on spironolactone, Entresto, hydralazine, Carvedilol, but ran out a few monts ago and has been only on Entresto (which he has a large supply of through the manufacturer) and hydralazine. -See above   Hypokalemia Mag normal -Supplement K   Microcytic anemia Chronic microcytosis.  No blood loss in clinical history.  Maybe thalassemia. -Check iron studies   Chronic kidney disease stage IIIb Cr 1.5 in mid 2020, more recently appears to be 1.8-2.1. Currently stable at that baseline, AKI ruled out   History of atrial fibrillation, transient This was in a previous hospitalization, 1 time, he was cardioverted, has had no further A. fib, is not on anticoagulation.   Cocaine use Tobacco use Alcohol use Cessation strongly recommended, connection between alcohol, cocaine and cardiomyopathy, premature death was stated clearly.    Paroxysmal atrial fibrillation Had  episode Afib in 2020, cardioverted, was supposed to be on Xarelto since, but ran out a few months ago -Resume Xarelto when off heparin     DVT prophylaxis: N/A on heparin  Code Status: FULL  Family Communication:   Disposition Plan: Anticipate LHC today then titrate BP in ICU, hopefully out of ICU tomorrow morning. Consults called: Cardiology, Dr. Radford Pax Admission status: INPATIENT      Medical decision making: Patient seen at 10:24 AM on 12/21/2020.  The patient was discussed with Dr. Radford Pax, Dr. Tomi Bamberger.  What exists of the patient's chart was reviewed in depth and summarized above.  Clinical condition: stable hemodynacmially.   The patient is critically ill with cardiac failure due to emergency severe hypertension.  Requires ICU level care due to titratable anytihypertensives.  Critical care was necessary to treat or prevent imminent or life-threatening deterioration of cardiac failure and was exclusive of separately billable procedures and treating other patients. Total critical care time spent by me: 50 minutes Time spent personally by me on obtaining history from patient or surrogate, evaluation of the patient, evaluation of patient's response to treatment, ordering and review of laboratory studies, ordering and review of radiographic studies, ordering and performing treatments and interventions, and re-evaluation of the patient's condition.       [facility] Please page though [place] or Epic secure chat:  For password, contact charge nurse

## 2020-12-21 NOTE — ED Notes (Addendum)
Attempted report to ICU RN at [facility] who was unavailable at this time; called cathlab at [facility] and spoke with Moses Manners RN who states [facility] was called and is aware of pt. Report given to Moses Manners RN in cathlab.

## 2020-12-21 NOTE — ED Provider Notes (Signed)
[facility] DEPT Provider Note   CSN: [id number] Arrival date & time: 12/21/20  [id number]     History Chief Complaint  Patient presents with  . Shortness of Breath    Gerald [name] is a 40 y.o. male.  40 year old male with history of A. fib, CHF (EF 30-35% on 06/2020 echo), hypertension, polysubstance abuse presents with complaint of shortness of breath with chest pain and coughing up pink-tinged sputum today.  Patient states symptoms started about 1 week ago, progressively worsening, went out drinking last night and when he woke up today he felt worse which prompted him to come to the emergency room today.  Patient reports taking his Delene Loll, otherwise noncompliant with his other medications as he cannot afford them.  States that he is trying to take care of his health however has been unsuccessful, admitted for same symptoms back in November.        Past Medical History:  Diagnosis Date  . Arrhythmia   . CHF (congestive heart failure) ([place])   . Dysrhythmia    brief episode afib, cardioverted did not return  . Hypertension   . Polysubstance abuse [facility])     Patient Active Problem List   Diagnosis Date Noted  . Hypertensive emergency 12/21/2020  . CKD (chronic kidney disease), stage III ([place]) 12/21/2020  . Acute on chronic systolic CHF (congestive heart failure) ([place]) 07/05/2020  . PAF (paroxysmal atrial fibrillation) ([place]) 07/05/2020  . Elevated troponin 07/05/2020  . Microcytic anemia 07/05/2020  . Hypokalemia 07/05/2020  . Malignant hypertension   . Acute on chronic combined systolic and diastolic CHF (congestive heart failure) ([place])   . Atrial flutter ([place])   . Essential hypertension   . Acute systolic congestive heart failure ([place])   . Acute respiratory failure ([place]) 04/01/2019  . NSTEMI (non-ST elevated myocardial infarction) ([place]) 12/21/2015    Past Surgical History:  Procedure Laterality Date  . KNEE SURGERY Right   . TEE  WITHOUT CARDIOVERSION N/A 04/04/2019   Procedure: TRANSESOPHAGEAL ECHOCARDIOGRAM (TEE);  Surgeon: [name], MD;  Location: [facility] ORS;  Service: Cardiovascular;  Laterality: N/A;       Family History  Problem Relation Age of Onset  . Hypertension Father   . Heart disease Paternal Grandfather     Social History   Tobacco Use  . Smoking status: Current Every Day Smoker    Packs/day: 1.00    Types: Cigarettes  . Smokeless tobacco: Never Used  Vaping Use  . Vaping Use: Never used  Substance Use Topics  . Alcohol use: Yes    Comment: occas.   . Drug use: Yes    Types: Cocaine, Marijuana    Home Medications Prior to Admission medications   Medication Sig Start Date End Date Taking? Authorizing Provider  hydrALAZINE (APRESOLINE) 100 MG tablet Take 1 tablet (100 mg total) by mouth 2 (two) times daily. 07/07/20  Yes Little Ishikawa, MD  sacubitril-valsartan (ENTRESTO) 24-26 MG Take 1 tablet by mouth 2 (two) times daily. 07/10/20  Yes Visser, Jacquelyn D, PA-C  atorvastatin (LIPITOR) 40 MG tablet Take 1 tablet (40 mg total) by mouth daily. 07/07/20   Little Ishikawa, MD  carvedilol (COREG) 25 MG tablet Take 1 tablet (25 mg total) by mouth 2 (two) times daily with a meal. 07/07/20 09/05/20  Little Ishikawa, MD  furosemide (LASIX) 20 MG tablet Take 1 tablet (20 mg total) by mouth daily. 07/08/20   Little Ishikawa, MD  potassium chloride  SA (K-DUR) 20 MEQ tablet Take 1 tablet (20 mEq total) by mouth daily. 05/06/19 07/10/20  Alisa Graff, FNP  rivaroxaban (XARELTO) 20 MG TABS tablet Take 1 tablet (20 mg total) by mouth daily with supper. 07/07/20   Little Ishikawa, MD  spironolactone (ALDACTONE) 25 MG tablet Take 1 tablet (25 mg total) by mouth daily. 07/08/20   Little Ishikawa, MD    Allergies    Patient has no known allergies.  Review of Systems   Review of Systems  Constitutional: Negative for chills and fever.  HENT: Negative for congestion  and sore throat.   Respiratory: Positive for cough and shortness of breath. Negative for chest tightness.   Cardiovascular: Positive for chest pain and leg swelling. Negative for palpitations.  Gastrointestinal: Negative for abdominal pain, constipation, diarrhea, nausea and vomiting.  Genitourinary: Positive for decreased urine volume.  Musculoskeletal: Negative for arthralgias and myalgias.  Skin: Negative for rash and wound.  Allergic/Immunologic: Negative for immunocompromised state.  Neurological: Negative for weakness.  Psychiatric/Behavioral: Negative for confusion.  All other systems reviewed and are negative.   Physical Exam Updated Vital Signs BP (!) 185/120   Pulse 89   Temp 98.6 F (37 C) (Oral)   Resp (!) 26   Ht '5\' 11"'$  (1.803 m)   Wt 127.6 kg   SpO2 96%   BMI 39.23 kg/m   Physical Exam Vitals and nursing note reviewed.  Constitutional:      General: He is not in acute distress.    Appearance: He is well-developed. He is not diaphoretic.  HENT:     Head: Normocephalic and atraumatic.  Cardiovascular:     Rate and Rhythm: Normal rate and regular rhythm.  Pulmonary:     Effort: Pulmonary effort is normal.     Breath sounds: Examination of the right-lower field reveals decreased breath sounds. Examination of the left-lower field reveals decreased breath sounds. Decreased breath sounds present.  Chest:     Chest wall: No tenderness.  Abdominal:     Palpations: Abdomen is soft.     Tenderness: There is no abdominal tenderness.  Musculoskeletal:     Right lower leg: No edema.     Left lower leg: No edema.  Skin:    General: Skin is warm and dry.     Findings: No erythema or rash.  Neurological:     Mental Status: He is alert and oriented to person, place, and time.  Psychiatric:        Behavior: Behavior normal.     ED Results / Procedures / Treatments   Labs (all labs ordered are listed, but only abnormal results are displayed) Labs Reviewed  BASIC  METABOLIC PANEL - Abnormal; Notable for the following components:      Result Value   Potassium 3.0 (*)    Glucose, Bld 113 (*)    BUN 30 (*)    Creatinine, Ser 1.90 (*)    Calcium 8.6 (*)    GFR, Estimated 45 (*)    All other components within normal limits  BRAIN NATRIURETIC PEPTIDE - Abnormal; Notable for the following components:   B Natriuretic Peptide 2,309.0 (*)    All other components within normal limits  CBC WITH DIFFERENTIAL/PLATELET - Abnormal; Notable for the following components:   Hemoglobin 10.2 (*)    HCT 34.5 (*)    MCV 69.3 (*)    MCH 20.5 (*)    MCHC 29.6 (*)    RDW 17.7 (*)  All other components within normal limits  RAPID URINE DRUG SCREEN, HOSP PERFORMED - Abnormal; Notable for the following components:   Cocaine POSITIVE (*)    All other components within normal limits  APTT - Abnormal; Notable for the following components:   aPTT 40 (*)    All other components within normal limits  TROPONIN I (HIGH SENSITIVITY) - Abnormal; Notable for the following components:   Troponin I (High Sensitivity) 1,954 (*)    All other components within normal limits  TROPONIN I (HIGH SENSITIVITY) - Abnormal; Notable for the following components:   Troponin I (High Sensitivity) 2,092 (*)    All other components within normal limits  RESP PANEL BY RT-PCR (FLU A&B, COVID) ARPGX2  MRSA PCR SCREENING  ETHANOL  MAGNESIUM  PROTIME-INR  HEPARIN LEVEL (UNFRACTIONATED)  TROPONIN I (HIGH SENSITIVITY)    EKG EKG Interpretation  Date/Time:  Friday Dec 21 2020 [id number] EDT Ventricular Rate:  104 PR Interval:  162 QRS Duration: 184 QT Interval:  428 QTC Calculation: 562 R Axis:   -70 Text Interpretation: Sinus tachycardia with Fusion complexes Possible Left atrial enlargement Left axis deviation Left bundle branch block Abnormal ECG Left bundle branch block new since last tracing Confirmed by Dorie Rank [phone]) on 12/21/2020 7:29:22 AM   Radiology DG Chest 2 View  Result  Date: 12/21/2020 CLINICAL DATA:  Shortness of breath. Hypertension, systolic CHF, renal insufficiency, cough and congestion. EXAM: CHEST - 2 VIEW COMPARISON:  July 05, 2020. FINDINGS: Enlarged cardiac silhouette, similar to prior. Pulmonary vascular congestion. Hazy diffuse opacities with more confluent airspace opacity at the bases. No visible pleural effusions or pneumothorax. No acute osseous abnormality. IMPRESSION: Cardiomegaly and pulmonary vascular congestion with hazy bilateral opacities and more confluent airspace opacities at the bases. Findings are favored to reflect CHF with alveolar edema, although infection at the lung bases is not excluded. Findings are similar to the prior from July 05, 2020. Electronically Signed   By: Margaretha Sheffield MD   On: 12/21/2020 07:50    Procedures .Critical Care Performed by: Tacy Learn, PA-C Authorized by: Tacy Learn, PA-C   Critical care provider statement:    Critical care time (minutes):  45   Critical care was time spent personally by me on the following activities:  Discussions with consultants, evaluation of patient's response to treatment, examination of patient, ordering and performing treatments and interventions, ordering and review of laboratory studies, ordering and review of radiographic studies, pulse oximetry, re-evaluation of patient's condition, obtaining history from patient or surrogate and review of old charts     Medications Ordered in ED Medications  nitroGLYCERIN 50 mg in dextrose 5 % 250 mL (0.2 mg/mL) infusion (45 mcg/min Intravenous Infusion Verify 12/21/20 1300)  ondansetron (ZOFRAN) injection 4 mg ( Intravenous MAR Hold 12/21/20 1344)  atorvastatin (LIPITOR) tablet 80 mg ( Oral Automatically Held 12/29/20 1000)  carvedilol (COREG) tablet 3.125 mg ( Oral Automatically Held 12/29/20 1700)  hydrALAZINE (APRESOLINE) tablet 100 mg ( Oral Automatically Held 12/29/20 2200)  acetaminophen (TYLENOL) tablet 650 mg ( Oral  MAR Hold 12/21/20 1344)  furosemide (LASIX) injection 40 mg ( Intravenous Automatically Held 12/29/20 1600)  potassium chloride SA (KLOR-CON) CR tablet 20 mEq ( Oral Automatically Held 12/29/20 2200)  aspirin EC tablet 81 mg ( Oral Automatically Held 12/30/20 1000)  heparin ADULT infusion 100 units/mL (25000 units/22m) (1,400 Units/hr Intravenous New Bag/Given 12/21/20 1113)  Chlorhexidine Gluconate Cloth 2 % PADS 6 each ( Topical Automatically Held 12/29/20 1000)  Heparin (Porcine) in NaCl 1000-0.9 UT/500ML-% SOLN (500 mLs  Given 12/21/20 1348)  fentaNYL (SUBLIMAZE) injection (25 mcg Intravenous Given 12/21/20 1350)  midazolam (VERSED) injection (2 mg Intravenous Given 12/21/20 1350)  lidocaine (PF) (XYLOCAINE) 1 % injection (2 mLs Infiltration Given 12/21/20 1401)  Radial Cocktail/Verapamil only (10 mLs Intra-arterial Given 12/21/20 1402)  furosemide (LASIX) injection 40 mg (40 mg Intravenous Given 12/21/20 0750)  metoprolol tartrate (LOPRESSOR) injection 5 mg (5 mg Intravenous Given 12/21/20 0834)  potassium chloride SA (KLOR-CON) CR tablet 40 mEq (40 mEq Oral Given 12/21/20 0836)  aspirin chewable tablet 324 mg (324 mg Oral Given 12/21/20 1050)  heparin bolus via infusion 4,000 Units (4,000 Units Intravenous Bolus from Bag 12/21/20 1113)    ED Course  I have reviewed the triage vital signs and the nursing notes.  Pertinent labs & imaging results that were available during my care of the patient were reviewed by me and considered in my medical decision making (see chart for details).  Clinical Course as of 12/21/20 1408  Fri Dec 21, 5237  1034 40 year old male with history of A. fib, not anticoagulated, CHF, presents with shortness of breath progressively worsening over the past week with complaint of chest pain this morning described as generalized tightness with pink-tinged sputum this morning which prompted him to return to emergency room.  States similar to admission for heart failure in November of last  year. On exam, mildly tachypneic with diminished lung sounds in the bases, patient is obese, no obvious pitting edema.  Satting 95% on room air, heart rate right at 100 with blood pressure of 188/136. Orders initiated. EKG showing new LBBB. CXR with likely volume overload/CHF.  Discussed with Dr. Tomi Bamberger, ER attending.  Order for Lasix and Nitro drip started and titrated to 25 currently. BNP elevated at 2309, patient voiding on recheck. Trop elevated at 1,954 (previously 200s). Cardiology paged for consult, hospitalist paged for admission.  CBC without significant changes, BMP with mild hypokalemia, will order oral dose. Cr 1.9, not significantly changed from prior.  [LM]  0902 HR remained over 100, given Lopressor for rate control. Improved to 90s. BP improving, currently 168/115 [LM]  [phone] Case discussed with Dr. Loleta Books with hospitalist service who will consult, requests cardiology to consult (awaiting call back).  Discussed with cards master who will have [facility] team see the patient and determine admission plan.  [LM]    Clinical Course User Index [LM] Roque Lias   MDM Rules/Calculators/A&P                          Final Clinical Impression(s) / ED Diagnoses Final diagnoses:  Hypertensive emergency  Acute congestive heart failure, unspecified heart failure type [facility])    Rx / DC Orders ED Discharge Orders    None       Tacy Learn, PA-C 12/21/20 1409    Horton, Alvin Critchley, DO 12/29/20 1509

## 2020-12-21 NOTE — ED Triage Notes (Signed)
Pt reports shortness of breath getting worse over last 4 days. Sts taking Entresto 49-51 mg BID. Also sts bilateral lower leg and abdominal swelling.

## 2020-12-21 NOTE — ED Notes (Signed)
As per MD Danford at bedside, titration goals for nitroglycerin drip is SBP < 160

## 2020-12-22 LAB — CBC
HCT: 34.1 % — ABNORMAL LOW (ref 39.0–52.0)
Hemoglobin: 10.4 g/dL — ABNORMAL LOW (ref 13.0–17.0)
MCH: 20.6 pg — ABNORMAL LOW (ref 26.0–34.0)
MCHC: 30.5 g/dL (ref 30.0–36.0)
MCV: 67.7 fL — ABNORMAL LOW (ref 80.0–100.0)
Platelets: 257 10*3/uL (ref 150–400)
RBC: 5.04 MIL/uL (ref 4.22–5.81)
RDW: 17.3 % — ABNORMAL HIGH (ref 11.5–15.5)
WBC: 6.5 10*3/uL (ref 4.0–10.5)
nRBC: 0 % (ref 0.0–0.2)

## 2020-12-22 LAB — BASIC METABOLIC PANEL
Anion gap: 10 (ref 5–15)
BUN: 23 mg/dL — ABNORMAL HIGH (ref 6–20)
CO2: 26 mmol/L (ref 22–32)
Calcium: 8.3 mg/dL — ABNORMAL LOW (ref 8.9–10.3)
Chloride: 102 mmol/L (ref 98–111)
Creatinine, Ser: 1.98 mg/dL — ABNORMAL HIGH (ref 0.61–1.24)
GFR, Estimated: 43 mL/min — ABNORMAL LOW (ref >60)
Glucose, Bld: 107 mg/dL — ABNORMAL HIGH (ref 70–99)
Potassium: 3.3 mmol/L — ABNORMAL LOW (ref 3.5–5.1)
Sodium: 138 mmol/L (ref 135–145)

## 2020-12-22 LAB — IRON AND TIBC
Iron: 19 ug/dL — ABNORMAL LOW (ref 45–182)
Saturation Ratios: 6 % — ABNORMAL LOW (ref 17.9–39.5)
TIBC: 305 ug/dL (ref 250–450)
UIBC: 286 ug/dL

## 2020-12-22 LAB — FERRITIN: Ferritin: 73 ng/mL (ref 24–336)

## 2020-12-22 LAB — HEMOGLOBIN A1C
Hgb A1c MFr Bld: 6.1 % — ABNORMAL HIGH (ref 4.8–5.6)
Mean Plasma Glucose: 128.37 mg/dL

## 2020-12-22 MED ORDER — CARVEDILOL 6.25 MG PO TABS
6.2500 mg | ORAL_TABLET | Freq: Two times a day (BID) | ORAL | Status: DC
  Administered 2020-12-22 – 2020-12-23 (×2): 6.25 mg via ORAL
  Filled 2020-12-22 (×2): qty 1

## 2020-12-22 MED ORDER — ISOSORBIDE DINITRATE 20 MG PO TABS
20.0000 mg | ORAL_TABLET | Freq: Three times a day (TID) | ORAL | Status: DC
  Administered 2020-12-22 – 2020-12-23 (×4): 20 mg via ORAL
  Filled 2020-12-22 (×5): qty 1

## 2020-12-22 MED ORDER — NICOTINE 21 MG/24HR TD PT24
21.0000 mg | MEDICATED_PATCH | Freq: Every day | TRANSDERMAL | Status: DC
  Filled 2020-12-22 (×2): qty 1

## 2020-12-22 MED ORDER — HYDRALAZINE HCL 50 MG PO TABS
100.0000 mg | ORAL_TABLET | Freq: Three times a day (TID) | ORAL | Status: DC
  Administered 2020-12-22 – 2020-12-24 (×7): 100 mg via ORAL
  Filled 2020-12-22 (×7): qty 2

## 2020-12-22 NOTE — Progress Notes (Signed)
PROGRESS NOTE    Gerald [name]  [id number] DOB: 11/03/1980 DOA: 12/21/2020 PCP: Patient, [name] (Inactive)    Chief Complaint  Patient presents with  . Shortness of Breath    Brief Narrative:  Gerald [name] is a 40 y.o. M with hx HTN, chronic systolic and diastolic CHF EF [id number], CKD IIIb, hx transient Afib s/p DCCV not taking AC, hx cocaine use who presents with 5 days progressive DOE nad cough and now severe chest pain.  Subjective: Patient blood pressure remains elevated, currently denies chest pain He c/o headache after taking isordil  He c/o bilateral lower extremity tingling He reports not taking any meds at home  Assessment & Plan:   Principal Problem:   Hypertensive emergency Active Problems:   Acute on chronic combined systolic and diastolic CHF (congestive heart failure) ([name])   Essential hypertension   PAF (paroxysmal atrial fibrillation) ([name])   Microcytic anemia   Hypokalemia   CKD (chronic kidney disease), stage III ([name])        Hypertension emergency/elevated troponin -High-sensitivity troponin 2272 on presentation, was initially on nitro drip and heparin drip ,underwent urgent cardiac catheterization on 5/6, Very large and normal coronary arteries -Cardiology is adjusting blood pressure medication  Paroxysmal A. Fib/LBBB -Currently on Coreg, Xarelto  Acute on chronic combined heart failure secondary to hypertension heart disease -Cardiac on 5/6 showed elevated LVEDP, pulmonary edema on chest x-ray -Mild volume overload on exam, Currently on IV Lasix, Coreg, currently restarted hydralazine and Isordil -Cardiology input appreciated, will follow recommendation  Hypokalemia, possible causing leg paresthesia  Replace K, check mag  Macrocytic anemia MCV in the 60s, appear chronic at least since 2015 Hgb at 10 close to baseline Does not appear to have overt blood loss, though did report hemoptysis on presentation, currently no  hemoptysis Follow-up with PCP  CKD 3B, renal function close to baseline, renal dosing meds      Polysubstance use     UDS positive for cocaine     Report alcohol use/tobacco use      Cessation education provided   Body mass index is 39.2 kg/m..  meet obesity criteria, recommend weight loss        Unresulted Labs (From admission, onward)          Start     Ordered   12/23/20 0500  Magnesium  Tomorrow morning,   R       Question:  Specimen collection method  Answer:  Lab=Lab collect   12/22/20 1522   12/23/20 0500  Hepatic function panel  Tomorrow morning,   R       Question:  Specimen collection method  Answer:  Lab=Lab collect   12/22/20 1522   12/22/20 [id number]  Basic metabolic panel  Daily,   R      12/21/20 1243            DVT prophylaxis:  rivaroxaban (XARELTO) tablet 20 mg   Code Status: Full Family Communication: Patient Disposition:   Status is: Inpatient   Dispo: The patient is from: Home              Anticipated d/c is to: Home              Anticipated d/c date is: Possible 24 to 48 hours, pending blood pressure control, need cardiology clearance                Consultants:   Cardiology  Procedures:   Cardiac  cath on 5/6  Antimicrobials:    Anti-infectives (From admission, onward)   None         Objective: Vitals:   12/22/20 0829 12/22/20 0930 12/22/20 1205 12/22/20 1416  BP: (!) 183/125  (!) 157/125 (!) 181/132  Pulse:  93 89   Resp:  18 20   Temp:  97.8 F (36.6 C) 97.6 F (36.4 C)   TempSrc:  Oral Oral   SpO2:  97% 96%   Weight:      Height:        Intake/Output Summary (Last 24 hours) at 12/22/2020 1536 Last data filed at 12/22/2020 1419 Gross per 24 hour  Intake 512.33 ml  Output 2040 ml  Net -1527.67 ml   Filed Weights   12/21/20 1230 12/21/20 2052 12/22/20 0522  Weight: 127.6 kg 127.3 kg 127.5 kg    Examination:  General exam: calm, NAD Respiratory system: Clear to auscultation. Respiratory effort  normal. Cardiovascular system: S1 & S2 heard, RRR.  No pedal edema. Gastrointestinal system: Abdomen is nondistended, soft and nontender. Normal bowel sounds heard. Central nervous system: Alert and oriented. No focal neurological deficits. Extremities: Symmetric 5 x 5 power. Skin: No rashes, lesions or ulcers Psychiatry: Judgement and insight appear normal. Mood & affect appropriate.     Data Reviewed: I have personally reviewed following labs and imaging studies  CBC: Recent Labs  Lab 12/21/20 0722 12/21/20 1516 12/22/20 0241  WBC 7.7 7.5 6.5  NEUTROABS 5.8  --   --   HGB 10.2* 10.3* 10.4*  HCT 34.5* 34.0* 34.1*  MCV 69.3* 68.5* 67.7*  PLT 252 234 [id number]    Basic Metabolic Panel: Recent Labs  Lab 12/21/20 0722 12/21/20 1516 12/22/20 0241  NA 139  --  138  K 3.0*  --  3.3*  CL 105  --  102  CO2 24  --  26  GLUCOSE 113*  --  107*  BUN 30*  --  23*  CREATININE 1.90* 2.01* 1.98*  CALCIUM 8.6*  --  8.3*  MG 2.1  --   --     GFR: Estimated Creatinine Clearance: 67.5 mL/min (A) (by C-G formula based on SCr of 1.98 mg/dL (H)).  Liver Function Tests: No results for input(s): AST, ALT, ALKPHOS, BILITOT, PROT, ALBUMIN in the last 168 hours.  CBG: No results for input(s): GLUCAP in the last 168 hours.   Recent Results (from the past 240 hour(s))  Resp Panel by RT-PCR (Flu A&B, Covid) Nasopharyngeal Swab     Status: None   Collection Time: 12/21/20  7:17 AM   Specimen: Nasopharyngeal Swab; Nasopharyngeal(NP) swabs in vial transport medium  Result Value Ref Range Status   SARS Coronavirus 2 by RT PCR NEGATIVE NEGATIVE Final    Comment: (NOTE) SARS-CoV-2 target nucleic acids are NOT DETECTED.  The SARS-CoV-2 RNA is generally detectable in upper respiratory specimens during the acute phase of infection. The lowest concentration of SARS-CoV-2 viral copies this assay can detect is 138 copies/mL. A negative result does not preclude SARS-Cov-2 infection and should not be  used as the sole basis for treatment or other patient management decisions. A negative result may occur with  improper specimen collection/handling, submission of specimen other than nasopharyngeal swab, presence of viral mutation(s) within the areas targeted by this assay, and inadequate number of viral copies(<138 copies/mL). A negative result must be combined with clinical observations, patient history, and epidemiological information. The expected result is Negative.  Fact Sheet for Patients:  EntrepreneurPulse.com.au  Fact Sheet for Healthcare Providers:  IncredibleEmployment.be  This test is no t yet approved or cleared by the [place] FDA and  has been authorized for detection and/or diagnosis of SARS-CoV-2 by FDA under an Emergency Use Authorization (EUA). This EUA will remain  in effect (meaning this test can be used) for the duration of the COVID-19 declaration under Section 564(b)(1) of the Act, 21 U.S.C.section 360bbb-3(b)(1), unless the authorization is terminated  or revoked sooner.       Influenza A by PCR NEGATIVE NEGATIVE Final   Influenza B by PCR NEGATIVE NEGATIVE Final    Comment: (NOTE) The Xpert Xpress SARS-CoV-2/FLU/RSV plus assay is intended as an aid in the diagnosis of influenza from Nasopharyngeal swab specimens and should not be used as a sole basis for treatment. Nasal washings and aspirates are unacceptable for Xpert Xpress SARS-CoV-2/FLU/RSV testing.  Fact Sheet for Patients: EntrepreneurPulse.com.au  Fact Sheet for Healthcare Providers: IncredibleEmployment.be  This test is not yet approved or cleared by the [place] FDA and has been authorized for detection and/or diagnosis of SARS-CoV-2 by FDA under an Emergency Use Authorization (EUA). This EUA will remain in effect (meaning this test can be used) for the duration of the COVID-19 declaration under Section  564(b)(1) of the Act, 21 U.S.C. section 360bbb-3(b)(1), unless the authorization is terminated or revoked.  Performed at [facility] [place] [address] [place], [place] [address]   MRSA PCR Screening     Status: None   Collection Time: 12/21/20  1:02 PM   Specimen: Nasal Mucosa; Nasopharyngeal  Result Value Ref Range Status   MRSA by PCR NEGATIVE NEGATIVE Final    Comment:        The GeneXpert MRSA Assay (FDA approved for NASAL specimens only), is one component of a comprehensive MRSA colonization surveillance program. It is not intended to diagnose MRSA infection nor to guide or monitor treatment for MRSA infections. Performed at [facility], [place] [address], [place], [place] [address]          Radiology Studies: DG Chest 2 View  Result Date: 12/21/2020 CLINICAL DATA:  Shortness of breath. Hypertension, systolic CHF, renal insufficiency, cough and congestion. EXAM: CHEST - 2 VIEW COMPARISON:  July 05, 2020. FINDINGS: Enlarged cardiac silhouette, similar to prior. Pulmonary vascular congestion. Hazy diffuse opacities with more confluent airspace opacity at the bases. No visible pleural effusions or pneumothorax. No acute osseous abnormality. IMPRESSION: Cardiomegaly and pulmonary vascular congestion with hazy bilateral opacities and more confluent airspace opacities at the bases. Findings are favored to reflect CHF with alveolar edema, although infection at the lung bases is not excluded. Findings are similar to the prior from July 05, 2020. Electronically Signed   By: Margaretha Sheffield MD   On: 12/21/2020 07:50   CARDIAC CATHETERIZATION  Result Date: [id number]  LV end diastolic pressure is moderately elevated.  1. Very large and normal coronary arteries 2. Moderately elevated LVEDP 24 mm Hg Plan: focus on BP control and optimization of CHF therapy.        Scheduled Meds: . aspirin EC  81 mg Oral Daily  . atorvastatin  80 mg Oral Daily  .  carvedilol  6.25 mg Oral BID WC  . furosemide  40 mg Intravenous BID  . hydrALAZINE  100 mg Oral Q8H  . isosorbide dinitrate  20 mg Oral TID  . nicotine  21 mg Transdermal Daily  . potassium chloride  40 mEq Oral BID  . rivaroxaban  20 mg Oral Q supper  . sodium chloride flush  3 mL Intravenous Q12H   Continuous Infusions: . sodium chloride       LOS: 1 day   Time spent: 76mns Greater than 50% of this time was spent in counseling, explanation of diagnosis, planning of further management, and coordination of care.   Voice Recognition /Viviann Sparedictation system was used to create this note, attempts have been made to correct errors. Please contact the author with questions and/or clarifications.   FFlorencia Reasons MD PhD FACP [facility]  Available via Epic secure chat 7am-7pm for nonurgent issues Please page for urgent issues To page the attending provider between 7A-7P or the covering provider during after hours 7P-7A, please log into the web site [email or web address] and access using universal [place] password for that web site. If you do not have the password, please call the hospital operator.    12/22/2020, 3:36 PM

## 2020-12-22 NOTE — Progress Notes (Signed)
Progress Note  Patient Name: Gerald [name] Date of Encounter: 12/22/2020  [facility] Cardiologist: Gerald Rogue, MD   Subjective   NAEO. LHC yesterday shows normal coronary arteries without obstruction. LVEDP was elevated.  Bps still elevated (183/125 this AM)   Inpatient Medications    Scheduled Meds: . aspirin EC  81 mg Oral Daily  . atorvastatin  80 mg Oral Daily  . carvedilol  3.125 mg Oral BID WC  . furosemide  40 mg Intravenous BID  . hydrALAZINE  100 mg Oral BID  . potassium chloride  40 mEq Oral BID  . rivaroxaban  20 mg Oral Q supper  . sodium chloride flush  3 mL Intravenous Q12H   Continuous Infusions: . sodium chloride    . nitroGLYCERIN Stopped (12/21/20 1838)   PRN Meds: sodium chloride, acetaminophen, ondansetron (ZOFRAN) IV, sodium chloride flush   Vital Signs    Vitals:   12/21/20 2130 12/22/20 0053 12/22/20 0522 12/22/20 0829  BP: (!) 165/126 (!) 159/127 (!) 160/113 (!) 183/125  Pulse:  90 87   Resp:  20 20   Temp:  99 F (37.2 C) 98.7 F (37.1 C)   TempSrc:  Oral Oral   SpO2:  98% 98%   Weight:   127.5 kg   Height:        Intake/Output Summary (Last 24 hours) at 12/22/2020 0834 Last data filed at 12/22/2020 0300 Gross per 24 hour  Intake 594.16 ml  Output 2690 ml  Net -2095.84 ml   Last 3 Weights 12/22/2020 12/21/2020 12/21/2020  Weight (lbs) 281 lb 1.4 oz 280 lb 11.2 oz 281 lb 4.9 oz  Weight (kg) 127.5 kg 127.325 kg 127.6 kg      Telemetry    reviewed - Personally Reviewed  ECG    LBBB, sinus tachycardia - Personally Reviewed  Physical Exam   GEN: No acute distress.   Neck: No JVD Cardiac: RRR, no murmurs, rubs, or gallops.  Respiratory: Clear to auscultation bilaterally. GI: Soft, nontender, non-distended  MS: No edema; No deformity. Neuro:  Nonfocal  Psych: Normal affect   Labs    High Sensitivity Troponin:   Recent Labs  Lab 12/21/20 0722 12/21/20 0836 12/21/20 1516  TROPONINIHS 1,954* 2,092* 2,272*       Chemistry Recent Labs  Lab 12/21/20 0722 12/21/20 1516 12/22/20 0241  NA 139  --  138  K 3.0*  --  3.3*  CL 105  --  102  CO2 24  --  26  GLUCOSE 113*  --  107*  BUN 30*  --  23*  CREATININE 1.90* 2.01* 1.98*  CALCIUM 8.6*  --  8.3*  GFRNONAA 45* 42* 43*  ANIONGAP 10  --  10     Hematology Recent Labs  Lab 12/21/20 0722 12/21/20 1516 12/22/20 0241  WBC 7.7 7.5 6.5  RBC 4.98 4.96 5.04  HGB 10.2* 10.3* 10.4*  HCT 34.5* 34.0* 34.1*  MCV 69.3* 68.5* 67.7*  MCH 20.5* 20.8* 20.6*  MCHC 29.6* 30.3 30.5  RDW 17.7* 17.2* 17.3*  PLT 252 234 257    BNP Recent Labs  Lab 12/21/20 0722  BNP 2,309.0*     DDimer No results for input(s): DDIMER in the last 168 hours.   Radiology    DG Chest 2 View  Result Date: 12/21/2020 CLINICAL DATA:  Shortness of breath. Hypertension, systolic CHF, renal insufficiency, cough and congestion. EXAM: CHEST - 2 VIEW COMPARISON:  July 05, 2020. FINDINGS: Enlarged cardiac silhouette,  similar to prior. Pulmonary vascular congestion. Hazy diffuse opacities with more confluent airspace opacity at the bases. No visible pleural effusions or pneumothorax. No acute osseous abnormality. IMPRESSION: Cardiomegaly and pulmonary vascular congestion with hazy bilateral opacities and more confluent airspace opacities at the bases. Findings are favored to reflect CHF with alveolar edema, although infection at the lung bases is not excluded. Findings are similar to the prior from July 05, 2020. Electronically Signed   By: Margaretha Sheffield MD   On: 12/21/2020 07:50   CARDIAC CATHETERIZATION  Result Date: 123XX123  LV end diastolic pressure is moderately elevated.  1. Very large and normal coronary arteries 2. Moderately elevated LVEDP 24 mm Hg Plan: focus on BP control and optimization of CHF therapy.    Cardiac Studies    06/2020 Echo (personally reviewed) LVEF 30%. At least moderate hypertrophy (concentric) of the LV RV normal Severely  dilated LA No significant valvular abnormalties    Assessment & Plan    Mr Gerald [name] is a 40yo man with severe HTN heart disease, chronic combined systolic/diastolic HF and polysubstance abuse who presented with chest pain and was found to have normal coronary arteries but HTN urgency.  #Chronic combined systolic and diastolic HF 2/2 HTN heart disease - warm and slightly volume overloaded based on LVEDP - cont coreg but at increased dose - increase hydralazine to TID - add isordil, uptitrate as tolerated - with AKI, entresto/spirono not options - lasix iv BID. Will plan to transition to oral diuretic regimen tomorrow if labs remain stable  #LBBB, intermittent - new. Not related to ischemic disease - will need to monitor as outpatient  #pAF - xarelto  #HTN, uncontrolled Chronic. Tells me that his BP was "always elevated" even in high school    For questions or updates, please contact [place] Please consult [email or web address] for contact info under        Signed, Vickie Epley, MD  12/22/2020, 8:34 AM

## 2020-12-23 DIAGNOSIS — Z9189 Other specified personal risk factors, not elsewhere classified: Secondary | ICD-10-CM

## 2020-12-23 DIAGNOSIS — D509 Iron deficiency anemia, unspecified: Secondary | ICD-10-CM

## 2020-12-23 DIAGNOSIS — I5043 Acute on chronic combined systolic (congestive) and diastolic (congestive) heart failure: Secondary | ICD-10-CM

## 2020-12-23 DIAGNOSIS — I10 Essential (primary) hypertension: Secondary | ICD-10-CM

## 2020-12-23 DIAGNOSIS — F1920 Other psychoactive substance dependence, uncomplicated: Secondary | ICD-10-CM

## 2020-12-23 DIAGNOSIS — I480 Paroxysmal atrial fibrillation: Secondary | ICD-10-CM

## 2020-12-23 DIAGNOSIS — E876 Hypokalemia: Secondary | ICD-10-CM

## 2020-12-23 DIAGNOSIS — N1832 Chronic kidney disease, stage 3b: Secondary | ICD-10-CM

## 2020-12-23 LAB — HEPATIC FUNCTION PANEL
ALT: 36 U/L (ref 0–44)
AST: 20 U/L (ref 15–41)
Albumin: 2.7 g/dL — ABNORMAL LOW (ref 3.5–5.0)
Alkaline Phosphatase: 73 U/L (ref 38–126)
Bilirubin, Direct: <0.1 mg/dL (ref 0.0–0.2)
Total Bilirubin: 0.6 mg/dL (ref 0.3–1.2)
Total Protein: 6.3 g/dL — ABNORMAL LOW (ref 6.5–8.1)

## 2020-12-23 LAB — RENAL FUNCTION PANEL
Albumin: 2.9 g/dL — ABNORMAL LOW (ref 3.5–5.0)
Anion gap: 8 (ref 5–15)
BUN: 27 mg/dL — ABNORMAL HIGH (ref 6–20)
CO2: 27 mmol/L (ref 22–32)
Calcium: 8.9 mg/dL (ref 8.9–10.3)
Chloride: 102 mmol/L (ref 98–111)
Creatinine, Ser: 2.1 mg/dL — ABNORMAL HIGH (ref 0.61–1.24)
GFR, Estimated: 40 mL/min — ABNORMAL LOW (ref >60)
Glucose, Bld: 115 mg/dL — ABNORMAL HIGH (ref 70–99)
Phosphorus: 2.7 mg/dL (ref 2.5–4.6)
Potassium: 3.5 mmol/L (ref 3.5–5.1)
Sodium: 137 mmol/L (ref 135–145)

## 2020-12-23 LAB — BASIC METABOLIC PANEL
Anion gap: 6 (ref 5–15)
BUN: 27 mg/dL — ABNORMAL HIGH (ref 6–20)
CO2: 28 mmol/L (ref 22–32)
Calcium: 8.5 mg/dL — ABNORMAL LOW (ref 8.9–10.3)
Chloride: 104 mmol/L (ref 98–111)
Creatinine, Ser: 2.22 mg/dL — ABNORMAL HIGH (ref 0.61–1.24)
GFR, Estimated: 37 mL/min — ABNORMAL LOW (ref >60)
Glucose, Bld: 109 mg/dL — ABNORMAL HIGH (ref 70–99)
Potassium: 3 mmol/L — ABNORMAL LOW (ref 3.5–5.1)
Sodium: 138 mmol/L (ref 135–145)

## 2020-12-23 LAB — MAGNESIUM: Magnesium: 1.9 mg/dL (ref 1.7–2.4)

## 2020-12-23 MED ORDER — SPIRONOLACTONE 25 MG PO TABS
25.0000 mg | ORAL_TABLET | Freq: Every day | ORAL | Status: DC
  Administered 2020-12-23 – 2020-12-24 (×2): 25 mg via ORAL
  Filled 2020-12-23 (×2): qty 1

## 2020-12-23 MED ORDER — FENTANYL CITRATE (PF) 100 MCG/2ML IJ SOLN
25.0000 ug | Freq: Once | INTRAMUSCULAR | Status: AC
  Administered 2020-12-23: 25 ug via INTRAVENOUS
  Filled 2020-12-23: qty 2

## 2020-12-23 MED ORDER — MELATONIN 3 MG PO TABS: 3.0000 mg | ORAL_TABLET | Freq: Every evening | ORAL | Status: DC | PRN

## 2020-12-23 MED ORDER — SODIUM CHLORIDE 0.9 % IV SOLN
510.0000 mg | Freq: Once | INTRAVENOUS | Status: AC
  Administered 2020-12-23: 510 mg via INTRAVENOUS
  Filled 2020-12-23: qty 17

## 2020-12-23 MED ORDER — TRAZODONE HCL 50 MG PO TABS
50.0000 mg | ORAL_TABLET | Freq: Every day | ORAL | Status: DC
  Administered 2020-12-23: 50 mg via ORAL
  Filled 2020-12-23: qty 1

## 2020-12-23 MED ORDER — TORSEMIDE 20 MG PO TABS
20.0000 mg | ORAL_TABLET | Freq: Every day | ORAL | Status: DC
  Administered 2020-12-24: 20 mg via ORAL
  Filled 2020-12-23: qty 1

## 2020-12-23 MED ORDER — ISOSORBIDE DINITRATE 30 MG PO TABS
30.0000 mg | ORAL_TABLET | Freq: Three times a day (TID) | ORAL | Status: DC
  Administered 2020-12-23 – 2020-12-24 (×3): 30 mg via ORAL
  Filled 2020-12-23 (×4): qty 1

## 2020-12-23 MED ORDER — POTASSIUM CHLORIDE CRYS ER 20 MEQ PO TBCR
40.0000 meq | EXTENDED_RELEASE_TABLET | Freq: Three times a day (TID) | ORAL | Status: DC
  Administered 2020-12-23 – 2020-12-24 (×5): 40 meq via ORAL
  Filled 2020-12-23 (×4): qty 2

## 2020-12-23 MED ORDER — MELATONIN 3 MG PO TABS: 6.0000 mg | ORAL_TABLET | Freq: Every evening | ORAL | Status: DC | PRN

## 2020-12-23 MED ORDER — OXYCODONE HCL 5 MG PO TABS
5.0000 mg | ORAL_TABLET | Freq: Once | ORAL | Status: AC | PRN
  Administered 2020-12-23: 5 mg via ORAL
  Filled 2020-12-23: qty 1

## 2020-12-23 MED ORDER — CARVEDILOL 25 MG PO TABS
25.0000 mg | ORAL_TABLET | Freq: Two times a day (BID) | ORAL | Status: DC
  Administered 2020-12-23 – 2020-12-24 (×2): 25 mg via ORAL
  Filled 2020-12-23 (×2): qty 1

## 2020-12-23 NOTE — Progress Notes (Signed)
PROGRESS NOTE  Gerald [name] [id number] DOB: [birth year]   PCP: Patient, [name] (Inactive)  Patient is from: Home.  DOA: 12/21/2020 LOS: 2  Chief complaints: Shortness of breath  Brief Narrative / Interim history: 40 year old M with PMH of combined CHF, CKD-3B, paroxysmal A. Fib s/p DCCV on Xarelto, HTN, cocaine use, morbid obesity, alcohol abuse and tobacco use presenting with 5 days of progressive dyspnea, cough and chest pain and admitted for acute on chronic combined CHF and hypertensive emergency.  Troponin elevated to 2272 but LHC with clean coronary arteries.  Continues to have dyspnea, orthopnea and markedly elevated blood pressure.  Cardiology following.  Subjective: Seen and examined earlier this morning.  Patient had difficulty sleep and he was given oxycodone last night.  Oxycodone made him feel "odd".  Continues to endorse dyspnea and orthopnea.  He denies chest pain, GI or UTI symptoms.  Denies prior diagnosis of OSA but admits to snoring and fatigue during the daytime.  Objective: Vitals:   12/23/20 0512 12/23/20 0516 12/23/20 0737 12/23/20 1145  BP: (!) 146/118 (!) 146/118 (!) 184/118 (!) 182/129  Pulse: 95  97 88  Resp: 20   20  Temp: 97.7 F (36.5 C)  97.6 F (36.4 C) 98.6 F (37 C)  TempSrc: Oral  Oral Oral  SpO2: 98%  97% 99%  Weight: 128.1 kg     Height:        Intake/Output Summary (Last 24 hours) at 12/23/2020 1403 Last data filed at 12/23/2020 1139 Gross per 24 hour  Intake 723 ml  Output 3165 ml  Net -2442 ml   Filed Weights   12/21/20 2052 12/22/20 0522 12/23/20 0512  Weight: 127.3 kg 127.5 kg 128.1 kg    Examination:  GENERAL: No apparent distress.  Nontoxic. HEENT: MMM.  Vision and hearing grossly intact.  NECK: Supple.  No apparent JVD.  RESP:  No IWOB.  Fair aeration bilaterally medical exam due to body habitus. CVS:  RRR. Heart sounds normal.  ABD/GI/GU: BS+. Abd soft, NTND.  MSK/EXT:  Moves extremities. No apparent deformity.  No edema.  SKIN: no apparent skin lesion or wound NEURO: Awake, alert and oriented appropriately.  No apparent focal neuro deficit. PSYCH: Calm. Normal affect.  Vital  Procedures:  5/6-LHC with very large and normal coronary arteries  Microbiology summarized: COVID-19 and influenza PCR nonreactive.  Assessment & Plan: Acute on chronic combined CHF: TTE in 06/2020 with LVEF of 30 to 35%, GH, G2 DD, severe LAE.  Continues to endorse dyspnea and orthopnea.  Blood pressure remains elevated.  Fluid status difficult to assess due to body habitus.  Creatinine is slightly up -Cardiology managing-holding diuretics today with a plan to resume torsemide tomorrow -GDMT-Coreg 25 mg twice daily, Isordil 30 mg 3 times daily, hydralazine 100 mg every 8 hours -Started Aldactone given hypokalemia -May consider adding Farxiga if okay with his renal function. -Monitor fluid status, renal functions and electrolytes.  Hypertensive emergency: BP remains elevated.  Seems to have persistent hypokalemia that worries me about hyperaldosteronism with his markedly elevated blood pressure.  His renal Doppler in 06/2020 did not reveal significant stenosis.  He is also at risk for OSA with high STOP-BANG score. -Agrees to try CPAP tonight -Cardiac meds as above -Check renin/aldosterone level  Elevated troponin/demand ischemia: Troponin elevated to 2200 but LHC with large normal coronary arteries. -Cardiac meds as above  Paroxysmal A. Fib/LBBB: Rate controlled -Continue Coreg and Xarelto  Persistent hypokalemia -Increase K-Dur 40 mEq from  twice daily to 3 times daily -Added Aldactone 25 mg daily -Recheck in the morning  Iron deficiency anemia: Iron saturation low.  TIBC on the higher side.  H&H stable. Recent Labs    07/05/20 0204 07/06/20 0306 07/07/20 0234 07/10/20 1734 12/21/20 0722 12/21/20 1516 12/22/20 0241  HGB 9.5* 9.2* 10.1* 10.2* 10.2* 10.3* 10.4*  -IV Feraheme on 5/8 -Check vitamin B12 and  folate  CKD-3B: Cr slightly up Recent Labs    07/05/20 0204 07/05/20 1210 07/06/20 0306 07/07/20 0234 07/10/20 1734 12/21/20 0722 12/21/20 1516 12/22/20 0241 12/23/20 0114  BUN 22* 19 23* 28* 33* 30*  --  23* 27*  CREATININE 1.61* 1.60* 2.00* 2.12* 1.86* 1.90* 2.01* 1.98* 2.22*  -Recheck in the morning  Polysubstance use (cocaine, alcohol and tobacco) -Encourage cessation   At risk for sleep apnea: Stop bang score > 4. -Benefit from outpatient sleep study -CPAP at night  Insomnia -Increase melatonin to 6 mg -Add trazodone   Morbid obesity Body mass index is 39.4 kg/m.   Notified by patient's RN later in the morning that patient was asking to leave AMA to go to [facility].  I have called back his room and talk to patient. Patient says " I want to live and go to [facility] I am not getting better.  My blood pressure still high.  I am not able to sleep. I don't know what is going on".  I have discussed the risk and benefit for leaving AMA, and the treatment plan with patient. He finally understood and agreed to stay.         DVT prophylaxis:   rivaroxaban (XARELTO) tablet 20 mg  Code Status: Full code Family Communication: Patient and/or RN. Available if any question.  Level of care: Progressive Status is: Inpatient  Remains inpatient appropriate because:Hemodynamically unstable, IV treatments appropriate due to intensity of illness or inability to take PO and Inpatient level of care appropriate due to severity of illness   Dispo: The patient is from: Home              Anticipated d/c is to: Home              Patient currently is not medically stable to d/c.   Difficult to place patient No       Consultants:  Cardiology   Sch Meds:  Scheduled Meds: . aspirin EC  81 mg Oral Daily  . atorvastatin  80 mg Oral Daily  . carvedilol  25 mg Oral BID WC  . hydrALAZINE  100 mg Oral Q8H  . isosorbide dinitrate  30 mg Oral TID  . nicotine  21 mg Transdermal Daily  .  potassium chloride  40 mEq Oral TID  . rivaroxaban  20 mg Oral Q supper  . sodium chloride flush  3 mL Intravenous Q12H  . spironolactone  25 mg Oral Daily  . [START ON 12/24/2020] torsemide  20 mg Oral Daily   Continuous Infusions: . sodium chloride    . ferumoxytol     PRN Meds:.sodium chloride, acetaminophen, melatonin, ondansetron (ZOFRAN) IV, sodium chloride flush  Antimicrobials: Anti-infectives (From admission, onward)   None       I have personally reviewed the following labs and images: CBC: Recent Labs  Lab 12/21/20 0722 12/21/20 1516 12/22/20 0241  WBC 7.7 7.5 6.5  NEUTROABS 5.8  --   --   HGB 10.2* 10.3* 10.4*  HCT 34.5* 34.0* 34.1*  MCV 69.3* 68.5* 67.7*  PLT 252 234 257  BMP &GFR Recent Labs  Lab 12/21/20 0722 12/21/20 1516 12/22/20 0241 12/23/20 0114  NA 139  --  138 138  K 3.0*  --  3.3* 3.0*  CL 105  --  102 104  CO2 24  --  26 28  GLUCOSE 113*  --  107* 109*  BUN 30*  --  23* 27*  CREATININE 1.90* 2.01* 1.98* 2.22*  CALCIUM 8.6*  --  8.3* 8.5*  MG 2.1  --   --  1.9   Estimated Creatinine Clearance: 60.3 mL/min (A) (by C-G formula based on SCr of 2.22 mg/dL (H)). Liver & Pancreas: Recent Labs  Lab 12/23/20 0114  AST 20  ALT 36  ALKPHOS 73  BILITOT 0.6  PROT 6.3*  ALBUMIN 2.7*   No results for input(s): LIPASE, AMYLASE in the last 168 hours. No results for input(s): AMMONIA in the last 168 hours. Diabetic: Recent Labs    12/22/20 0241  HGBA1C 6.1*   No results for input(s): GLUCAP in the last 168 hours. Cardiac Enzymes: No results for input(s): CKTOTAL, CKMB, CKMBINDEX, TROPONINI in the last 168 hours. No results for input(s): PROBNP in the last 8760 hours. Coagulation Profile: Recent Labs  Lab 12/21/20 1109  INR 1.1   Thyroid Function Tests: No results for input(s): TSH, T4TOTAL, FREET4, T3FREE, THYROIDAB in the last 72 hours. Lipid Profile: No results for input(s): CHOL, HDL, LDLCALC, TRIG, CHOLHDL, LDLDIRECT in the  last 72 hours. Anemia Panel: Recent Labs    12/22/20 0241  FERRITIN 73  TIBC 305  IRON 19*   Urine analysis: No results found for: COLORURINE, APPEARANCEUR, LABSPEC, PHURINE, GLUCOSEU, HGBUR, BILIRUBINUR, KETONESUR, PROTEINUR, UROBILINOGEN, NITRITE, LEUKOCYTESUR Sepsis Labs: Invalid input(s): PROCALCITONIN, [place]  Microbiology: Recent Results (from the past 240 hour(s))  Resp Panel by RT-PCR (Flu A&B, Covid) Nasopharyngeal Swab     Status: None   Collection Time: 12/21/20  7:17 AM   Specimen: Nasopharyngeal Swab; Nasopharyngeal(NP) swabs in vial transport medium  Result Value Ref Range Status   SARS Coronavirus 2 by RT PCR NEGATIVE NEGATIVE Final    Comment: (NOTE) SARS-CoV-2 target nucleic acids are NOT DETECTED.  The SARS-CoV-2 RNA is generally detectable in upper respiratory specimens during the acute phase of infection. The lowest concentration of SARS-CoV-2 viral copies this assay can detect is 138 copies/mL. A negative result does not preclude SARS-Cov-2 infection and should not be used as the sole basis for treatment or other patient management decisions. A negative result may occur with  improper specimen collection/handling, submission of specimen other than nasopharyngeal swab, presence of viral mutation(s) within the areas targeted by this assay, and inadequate number of viral copies(<138 copies/mL). A negative result must be combined with clinical observations, patient history, and epidemiological information. The expected result is Negative.  Fact Sheet for Patients:  EntrepreneurPulse.com.au  Fact Sheet for Healthcare Providers:  IncredibleEmployment.be  This test is no t yet approved or cleared by the [place] FDA and  has been authorized for detection and/or diagnosis of SARS-CoV-2 by FDA under an Emergency Use Authorization (EUA). This EUA will remain  in effect (meaning this test can be used) for the  duration of the COVID-19 declaration under Section 564(b)(1) of the Act, 21 U.S.C.section 360bbb-3(b)(1), unless the authorization is terminated  or revoked sooner.       Influenza A by PCR NEGATIVE NEGATIVE Final   Influenza B by PCR NEGATIVE NEGATIVE Final    Comment: (NOTE) The Xpert Xpress SARS-CoV-2/FLU/RSV plus assay is intended as  an aid in the diagnosis of influenza from Nasopharyngeal swab specimens and should not be used as a sole basis for treatment. Nasal washings and aspirates are unacceptable for Xpert Xpress SARS-CoV-2/FLU/RSV testing.  Fact Sheet for Patients: EntrepreneurPulse.com.au  Fact Sheet for Healthcare Providers: IncredibleEmployment.be  This test is not yet approved or cleared by the [place] FDA and has been authorized for detection and/or diagnosis of SARS-CoV-2 by FDA under an Emergency Use Authorization (EUA). This EUA will remain in effect (meaning this test can be used) for the duration of the COVID-19 declaration under Section 564(b)(1) of the Act, 21 U.S.C. section 360bbb-3(b)(1), unless the authorization is terminated or revoked.  Performed at [facility], [place] [address] [place], [place] [address]   MRSA PCR Screening     Status: None   Collection Time: 12/21/20  1:02 PM   Specimen: Nasal Mucosa; Nasopharyngeal  Result Value Ref Range Status   MRSA by PCR NEGATIVE NEGATIVE Final    Comment:        The GeneXpert MRSA Assay (FDA approved for NASAL specimens only), is one component of a comprehensive MRSA colonization surveillance program. It is not intended to diagnose MRSA infection nor to guide or monitor treatment for MRSA infections. Performed at [facility], [place] [address], [place], [place] [address]     Radiology Studies: No results found.     Quintarius Ferns T. Wilber[place]  If 7PM-7AM, please contact night-coverage [email or web address] 12/23/2020, 2:03 PM

## 2020-12-23 NOTE — Progress Notes (Signed)
Patient requested tylenol. Entered patient room and he states "I want to leave, I want to go to [facility]".  Asked patient if I could do anything to help and he continues to state that he just wants to leave.  Dr. Cyndia Skeeters notified.  He will talk to patient.  Patient's nurse updated on situation.

## 2020-12-23 NOTE — Progress Notes (Signed)
Progress Note  Patient Name: Gerald [name] Date of Encounter: 12/23/2020  [facility] Cardiologist: Ida Rogue, MD   Subjective   NAEO. Bps still elevated. Back pain this AM.   Inpatient Medications    Scheduled Meds: . aspirin EC  81 mg Oral Daily  . atorvastatin  80 mg Oral Daily  . carvedilol  6.25 mg Oral BID WC  . furosemide  40 mg Intravenous BID  . hydrALAZINE  100 mg Oral Q8H  . isosorbide dinitrate  20 mg Oral TID  . nicotine  21 mg Transdermal Daily  . potassium chloride  40 mEq Oral TID  . rivaroxaban  20 mg Oral Q supper  . sodium chloride flush  3 mL Intravenous Q12H   Continuous Infusions: . sodium chloride     PRN Meds: sodium chloride, acetaminophen, melatonin, ondansetron (ZOFRAN) IV, sodium chloride flush   Vital Signs    Vitals:   12/23/20 0030 12/23/20 0512 12/23/20 0516 12/23/20 0737  BP: (!) 169/114 (!) 146/118 (!) 146/118 (!) 184/118  Pulse: 91 95  97  Resp: 19 20    Temp: 98.1 F (36.7 C) 97.7 F (36.5 C)  97.6 F (36.4 C)  TempSrc: Oral Oral  Oral  SpO2: 98% 98%  97%  Weight:  128.1 kg    Height:        Intake/Output Summary (Last 24 hours) at 12/23/2020 0911 Last data filed at 12/23/2020 0801 Gross per 24 hour  Intake 723 ml  Output 3315 ml  Net -2592 ml   Last 3 Weights 12/23/2020 12/22/2020 12/21/2020  Weight (lbs) 282 lb 8 oz 281 lb 1.4 oz 280 lb 11.2 oz  Weight (kg) 128.141 kg 127.5 kg 127.325 kg      Telemetry    reviewed - Personally Reviewed  ECG    LBBB, sinus tachycardia - Personally Reviewed  Physical Exam   GEN: No acute distress.   Neck: No JVD Cardiac: RRR, no murmurs, rubs, or gallops.  Respiratory: Clear to auscultation bilaterally. GI: Soft, nontender, non-distended  MS: No edema; No deformity. Neuro:  Nonfocal  Psych: Normal affect   Labs    High Sensitivity Troponin:   Recent Labs  Lab 12/21/20 0722 12/21/20 0836 12/21/20 1516  TROPONINIHS 1,954* 2,092* 2,272*       Chemistry Recent Labs  Lab 12/21/20 0722 12/21/20 1516 12/22/20 0241 12/23/20 0114  NA 139  --  138 138  K 3.0*  --  3.3* 3.0*  CL 105  --  102 104  CO2 24  --  26 28  GLUCOSE 113*  --  107* 109*  BUN 30*  --  23* 27*  CREATININE 1.90* 2.01* 1.98* 2.22*  CALCIUM 8.6*  --  8.3* 8.5*  PROT  --   --   --  6.3*  ALBUMIN  --   --   --  2.7*  AST  --   --   --  20  ALT  --   --   --  36  ALKPHOS  --   --   --  73  BILITOT  --   --   --  0.6  GFRNONAA 45* 42* 43* 37*  ANIONGAP 10  --  10 6     Hematology Recent Labs  Lab 12/21/20 0722 12/21/20 1516 12/22/20 0241  WBC 7.7 7.5 6.5  RBC 4.98 4.96 5.04  HGB 10.2* 10.3* 10.4*  HCT 34.5* 34.0* 34.1*  MCV 69.3* 68.5* 67.7*  MCH  20.5* 20.8* 20.6*  MCHC 29.6* 30.3 30.5  RDW 17.7* 17.2* 17.3*  PLT 252 234 257    BNP Recent Labs  Lab 12/21/20 0722  BNP 2,309.0*     DDimer No results for input(s): DDIMER in the last 168 hours.   Radiology    CARDIAC CATHETERIZATION  Result Date: 123XX123  LV end diastolic pressure is moderately elevated.  1. Very large and normal coronary arteries 2. Moderately elevated LVEDP 24 mm Hg Plan: focus on BP control and optimization of CHF therapy.    Cardiac Studies    06/2020 Echo (personally reviewed) LVEF 30%. At least moderate hypertrophy (concentric) of the LV RV normal Severely dilated LA No significant valvular abnormalties    Assessment & Plan    Mr Mcirvin is a 40yo man with severe HTN heart disease, chronic combined systolic/diastolic HF and polysubstance abuse who presented with chest pain and was found to have normal coronary arteries but HTN urgency.  #Chronic combined systolic and diastolic HF 2/2 HTN heart disease - warm and euvolemic on exam - cont coreg at '25mg'$  BID - cont TID hydralazine - cont isordil at increased dose - with AKI, entresto/spirono not options - hold diuretics today - torsemide to start tomorrow once daily - K supplementation  #LBBB,  intermittent - new. Not related to ischemic disease - will need to monitor as outpatient  #pAF - xarelto  #HTN, uncontrolled Chronic. Tells me that his BP was "always elevated" even in high school  #Back pain - tylenol, heat - up to chair - avoid narcotics if possible  #Substance abuse Cocaine positive on admission   For questions or updates, please contact [place] Please consult [email or web address] for contact info under        Signed, Vickie Epley, MD  12/23/2020, 9:11 AM

## 2020-12-24 ENCOUNTER — Encounter (HOSPITAL_COMMUNITY): Payer: Self-pay | Admitting: Cardiology

## 2020-12-24 DIAGNOSIS — Z5329 Procedure and treatment not carried out because of patient's decision for other reasons: Secondary | ICD-10-CM

## 2020-12-24 DIAGNOSIS — R825 Elevated urine levels of drugs, medicaments and biological substances: Secondary | ICD-10-CM

## 2020-12-24 DIAGNOSIS — R519 Headache, unspecified: Secondary | ICD-10-CM

## 2020-12-24 DIAGNOSIS — G4700 Insomnia, unspecified: Secondary | ICD-10-CM

## 2020-12-24 LAB — RENAL FUNCTION PANEL
Albumin: 3 g/dL — ABNORMAL LOW (ref 3.5–5.0)
Anion gap: 9 (ref 5–15)
BUN: 27 mg/dL — ABNORMAL HIGH (ref 6–20)
CO2: 25 mmol/L (ref 22–32)
Calcium: 8.9 mg/dL (ref 8.9–10.3)
Chloride: 104 mmol/L (ref 98–111)
Creatinine, Ser: 2.17 mg/dL — ABNORMAL HIGH (ref 0.61–1.24)
GFR, Estimated: 39 mL/min — ABNORMAL LOW (ref >60)
Glucose, Bld: 119 mg/dL — ABNORMAL HIGH (ref 70–99)
Phosphorus: 3.3 mg/dL (ref 2.5–4.6)
Potassium: 3.3 mmol/L — ABNORMAL LOW (ref 3.5–5.1)
Sodium: 138 mmol/L (ref 135–145)

## 2020-12-24 LAB — VITAMIN B12: Vitamin B-12: 643 pg/mL (ref 180–914)

## 2020-12-24 LAB — MAGNESIUM: Magnesium: 2 mg/dL (ref 1.7–2.4)

## 2020-12-24 LAB — FOLATE: Folate: 16.9 ng/mL (ref >5.9)

## 2020-12-24 MED ORDER — ISOSORBIDE MONONITRATE ER 60 MG PO TB24: 60.0000 mg | ORAL_TABLET | Freq: Every day | ORAL | Status: DC

## 2020-12-24 MED ORDER — FENTANYL CITRATE (PF) 100 MCG/2ML IJ SOLN
25.0000 ug | Freq: Once | INTRAMUSCULAR | Status: DC
  Filled 2020-12-24: qty 2

## 2020-12-24 MED ORDER — FENTANYL CITRATE (PF) 100 MCG/2ML IJ SOLN
25.0000 ug | INTRAMUSCULAR | Status: DC | PRN
  Administered 2020-12-24: 25 ug via INTRAVENOUS

## 2020-12-24 MED ORDER — SACUBITRIL-VALSARTAN 24-26 MG PO TABS
1.0000 | ORAL_TABLET | Freq: Two times a day (BID) | ORAL | Status: DC
  Administered 2020-12-24: 1 via ORAL
  Filled 2020-12-24: qty 1

## 2020-12-24 NOTE — Progress Notes (Signed)
Pt not ready for CPAP at this time. Advised pt to notify for RT if he needs assistance with machine. RT will continue to monitor.

## 2020-12-24 NOTE — Progress Notes (Signed)
PROGRESS NOTE  Gerald [name] [id number] DOB: 1981/06/27   PCP: Patient, [name] (Inactive)  Patient is from: Home.  DOA: 12/21/2020 LOS: 3  Chief complaints: Shortness of breath  Brief Narrative / Interim history: 40 year old M with PMH of combined CHF, CKD-3B, paroxysmal A. Fib s/p DCCV on Xarelto, HTN, cocaine use, morbid obesity, alcohol abuse and tobacco use presenting with 5 days of progressive dyspnea, cough and chest pain and admitted for acute on chronic combined CHF and hypertensive emergency.  Troponin elevated to 2272 but LHC with clean coronary arteries.  Continues to have dyspnea, orthopnea and markedly elevated blood pressure.  Cardiology managing.  Subjective: Seen and examined earlier this morning.  No major events overnight of this morning.  He reports severe headache.  Fentanyl helped yesterday.  He denies chest pain, shortness of breath, GI or UTI symptoms.  Cardiology discontinued his Isordil today.  Objective: Vitals:   12/24/20 0523 12/24/20 0534 12/24/20 0745 12/24/20 1148  BP: (!) 168/116 (!) 168/116 (!) 165/115 (!) 162/100  Pulse: 90  88 81  Resp: '16  17 18  '$ Temp: 98.2 F (36.8 C)  98.3 F (36.8 C) 98.1 F (36.7 C)  TempSrc: Oral  Oral Oral  SpO2: 99%  98% 98%  Weight: 127.7 kg     Height:        Intake/Output Summary (Last 24 hours) at 12/24/2020 1204 Last data filed at 12/24/2020 1152 Gross per 24 hour  Intake --  Output 1200 ml  Net -1200 ml   Filed Weights   12/22/20 0522 12/23/20 0512 12/24/20 0523  Weight: 127.5 kg 128.1 kg 127.7 kg    Examination:  GENERAL: No apparent distress.  Nontoxic. HEENT: MMM.  Vision and hearing grossly intact.  NECK: Supple.  No apparent JVD but difficult exam due to body habitus.  RESP: 99% on RA.  No IWOB.  Fair aeration bilaterally. CVS:  RRR. Heart sounds normal.  ABD/GI/GU: BS+. Abd soft, NTND.  MSK/EXT:  Moves extremities. No apparent deformity. No edema.  SKIN: no apparent skin lesion or  wound NEURO: Awake, alert and oriented appropriately.  No apparent focal neuro deficit. PSYCH: Calm. Normal affect.   Procedures:  5/6-LHC with very large and normal coronary arteries  Microbiology summarized: COVID-19 and influenza PCR nonreactive.  Assessment & Plan: Acute on chronic combined CHF: TTE in 06/2020 with LVEF of 30 to 35%, GH, G2 DD, severe LAE.  Continues to endorse dyspnea and orthopnea.  BP remains elevated.   About 1.4 L UOP/24 hours.  Net -6 L. Fluid status difficult to assess due to body habitus. Cr stable at 2.17. -Cardiology managing-resume Demadex at 20 mg daily -GDMT-now on Entresto 24/26, Coreg 25 mg,, Imdur 60 mg, hydralazine 100 mg -Started Aldactone given hypokalemia -May consider adding Farxiga if okay with his renal function. -Monitor fluid status, renal functions and electrolytes.  Hypertensive emergency: BP remains elevated.  Seems to have persistent hypokalemia that worries me about hyperaldosteronism with his markedly elevated blood pressure.  His renal Doppler in 06/2020 did not reveal significant stenosis.  He is also at risk for OSA with high STOP-BANG score. -Tolerated CPAP-continue -Cardiac meds as above -Follow renin/aldosterone level  Elevated troponin/demand ischemia: Troponin elevated to 2200 but LHC with large normal coronary arteries. -Cardiac meds as above  Paroxysmal A. Fib/LBBB: Rate controlled -Continue Coreg and Xarelto  Persistent hypokalemia -Continue K-Dur 40 mEq from twice daily to 3 times daily -Continue Aldactone 25 mg daily -Recheck in the morning  Iron deficiency anemia: Iron saturation low.  TIBC on the higher side.  H&H stable. Recent Labs    07/05/20 0204 07/06/20 0306 07/07/20 0234 07/10/20 1734 12/21/20 0722 12/21/20 1516 12/22/20 0241  HGB 9.5* 9.2* 10.1* 10.2* 10.2* 10.3* 10.4*  -IV Feraheme on 5/8 -Monitor intermittently  CKD-3B: Cr slightly up Recent Labs    07/05/20 0204 07/05/20 1210  07/06/20 0306 07/07/20 0234 07/10/20 1734 12/21/20 0722 12/21/20 1516 12/22/20 0241 12/23/20 0114 12/23/20 1854 12/24/20 0139  BUN 22* 19 23* 28* 33* 30*  --  23* 27* 27* 27*  CREATININE 1.61* 1.60* 2.00* 2.12* 1.86* 1.90* 2.01* 1.98* 2.22* 2.10* 2.17*  -Recheck in the morning  Polysubstance use (cocaine, alcohol and tobacco) -Encourage cessation   At risk for sleep apnea: Stop bang score > 4. -Benefit from outpatient sleep study -CPAP at night  Insomnia -Continue melatonin and trazodone  Headache: Likely due to Isordil. -Cardiology change Isordil to Imdur -Ordered as needed fentanyl   Morbid obesity Body mass index is 39.28 kg/m.           DVT prophylaxis:   rivaroxaban (XARELTO) tablet 20 mg  Code Status: Full code Family Communication: Patient and/or RN. Available if any question.  Level of care: Progressive Status is: Inpatient  Remains inpatient appropriate because:Hemodynamically unstable, IV treatments appropriate due to intensity of illness or inability to take PO and Inpatient level of care appropriate due to severity of illness   Dispo: The patient is from: Home              Anticipated d/c is to: Home              Patient currently is not medically stable to d/c.   Difficult to place patient No       Consultants:  Cardiology   Sch Meds:  Scheduled Meds: . aspirin EC  81 mg Oral Daily  . atorvastatin  80 mg Oral Daily  . carvedilol  25 mg Oral BID WC  . hydrALAZINE  100 mg Oral Q8H  . nicotine  21 mg Transdermal Daily  . potassium chloride  40 mEq Oral TID  . rivaroxaban  20 mg Oral Q supper  . sacubitril-valsartan  1 tablet Oral BID  . sodium chloride flush  3 mL Intravenous Q12H  . spironolactone  25 mg Oral Daily  . torsemide  20 mg Oral Daily  . traZODone  50 mg Oral QHS   Continuous Infusions: . sodium chloride     PRN Meds:.sodium chloride, acetaminophen, fentaNYL (SUBLIMAZE) injection, melatonin, ondansetron (ZOFRAN)  IV, sodium chloride flush  Antimicrobials: Anti-infectives (From admission, onward)   None       I have personally reviewed the following labs and images: CBC: Recent Labs  Lab 12/21/20 0722 12/21/20 1516 12/22/20 0241  WBC 7.7 7.5 6.5  NEUTROABS 5.8  --   --   HGB 10.2* 10.3* 10.4*  HCT 34.5* 34.0* 34.1*  MCV 69.3* 68.5* 67.7*  PLT 252 234 257   BMP &GFR Recent Labs  Lab 12/21/20 0722 12/21/20 1516 12/22/20 0241 12/23/20 0114 12/23/20 1854 12/24/20 0139  NA 139  --  138 138 137 138  K 3.0*  --  3.3* 3.0* 3.5 3.3*  CL 105  --  102 104 102 104  CO2 24  --  '26 28 27 25  '$ GLUCOSE 113*  --  107* 109* 115* 119*  BUN 30*  --  23* 27* 27* 27*  CREATININE 1.90* 2.01* 1.98* 2.22* 2.10*  2.17*  CALCIUM 8.6*  --  8.3* 8.5* 8.9 8.9  MG 2.1  --   --  1.9  --  2.0  PHOS  --   --   --   --  2.7 3.3   Estimated Creatinine Clearance: 61.6 mL/min (A) (by C-G formula based on SCr of 2.17 mg/dL (H)). Liver & Pancreas: Recent Labs  Lab 12/23/20 0114 12/23/20 1854 12/24/20 0139  AST 20  --   --   ALT 36  --   --   ALKPHOS 73  --   --   BILITOT 0.6  --   --   PROT 6.3*  --   --   ALBUMIN 2.7* 2.9* 3.0*   No results for input(s): LIPASE, AMYLASE in the last 168 hours. No results for input(s): AMMONIA in the last 168 hours. Diabetic: Recent Labs    12/22/20 0241  HGBA1C 6.1*   No results for input(s): GLUCAP in the last 168 hours. Cardiac Enzymes: No results for input(s): CKTOTAL, CKMB, CKMBINDEX, TROPONINI in the last 168 hours. No results for input(s): PROBNP in the last 8760 hours. Coagulation Profile: Recent Labs  Lab 12/21/20 1109  INR 1.1   Thyroid Function Tests: No results for input(s): TSH, T4TOTAL, FREET4, T3FREE, THYROIDAB in the last 72 hours. Lipid Profile: No results for input(s): CHOL, HDL, LDLCALC, TRIG, CHOLHDL, LDLDIRECT in the last 72 hours. Anemia Panel: Recent Labs    12/22/20 0241 12/24/20 0139  VITAMINB12  --  643  FOLATE  --  16.9   FERRITIN 73  --   TIBC 305  --   IRON 19*  --    Urine analysis: No results found for: COLORURINE, APPEARANCEUR, LABSPEC, PHURINE, GLUCOSEU, HGBUR, BILIRUBINUR, KETONESUR, PROTEINUR, UROBILINOGEN, NITRITE, LEUKOCYTESUR Sepsis Labs: Invalid input(s): PROCALCITONIN, [place]  Microbiology: Recent Results (from the past 240 hour(s))  Resp Panel by RT-PCR (Flu A&B, Covid) Nasopharyngeal Swab     Status: None   Collection Time: 12/21/20  7:17 AM   Specimen: Nasopharyngeal Swab; Nasopharyngeal(NP) swabs in vial transport medium  Result Value Ref Range Status   SARS Coronavirus 2 by RT PCR NEGATIVE NEGATIVE Final    Comment: (NOTE) SARS-CoV-2 target nucleic acids are NOT DETECTED.  The SARS-CoV-2 RNA is generally detectable in upper respiratory specimens during the acute phase of infection. The lowest concentration of SARS-CoV-2 viral copies this assay can detect is 138 copies/mL. A negative result does not preclude SARS-Cov-2 infection and should not be used as the sole basis for treatment or other patient management decisions. A negative result may occur with  improper specimen collection/handling, submission of specimen other than nasopharyngeal swab, presence of viral mutation(s) within the areas targeted by this assay, and inadequate number of viral copies(<138 copies/mL). A negative result must be combined with clinical observations, patient history, and epidemiological information. The expected result is Negative.  Fact Sheet for Patients:  EntrepreneurPulse.com.au  Fact Sheet for Healthcare Providers:  IncredibleEmployment.be  This test is no t yet approved or cleared by the [place] FDA and  has been authorized for detection and/or diagnosis of SARS-CoV-2 by FDA under an Emergency Use Authorization (EUA). This EUA will remain  in effect (meaning this test can be used) for the duration of the COVID-19 declaration under Section  564(b)(1) of the Act, 21 U.S.C.section 360bbb-3(b)(1), unless the authorization is terminated  or revoked sooner.       Influenza A by PCR NEGATIVE NEGATIVE Final   Influenza B by PCR NEGATIVE NEGATIVE Final  Comment: (NOTE) The Xpert Xpress SARS-CoV-2/FLU/RSV plus assay is intended as an aid in the diagnosis of influenza from Nasopharyngeal swab specimens and should not be used as a sole basis for treatment. Nasal washings and aspirates are unacceptable for Xpert Xpress SARS-CoV-2/FLU/RSV testing.  Fact Sheet for Patients: EntrepreneurPulse.com.au  Fact Sheet for Healthcare Providers: IncredibleEmployment.be  This test is not yet approved or cleared by the [place] FDA and has been authorized for detection and/or diagnosis of SARS-CoV-2 by FDA under an Emergency Use Authorization (EUA). This EUA will remain in effect (meaning this test can be used) for the duration of the COVID-19 declaration under Section 564(b)(1) of the Act, 21 U.S.C. section 360bbb-3(b)(1), unless the authorization is terminated or revoked.  Performed at [facility], [place] [address], [place], [place] [address]   MRSA PCR Screening     Status: None   Collection Time: 12/21/20  1:02 PM   Specimen: Nasal Mucosa; Nasopharyngeal  Result Value Ref Range Status   MRSA by PCR NEGATIVE NEGATIVE Final    Comment:        The GeneXpert MRSA Assay (FDA approved for NASAL specimens only), is one component of a comprehensive MRSA colonization surveillance program. It is not intended to diagnose MRSA infection nor to guide or monitor treatment for MRSA infections. Performed at [facility], [place] [address], [place], [place] [address]     Radiology Studies: No results found.     Matyas Baisley T. [place]  If 7PM-7AM, please contact night-coverage [email or web address] 12/24/2020, 12:04 PM

## 2020-12-24 NOTE — Progress Notes (Signed)
Progress Note  Patient Name: Gerald [name] Date of Encounter: 12/24/2020  [facility] Cardiologist: Ida Rogue, MD   Subjective   No CP or dyspnea; complains of HA  Inpatient Medications    Scheduled Meds: . aspirin EC  81 mg Oral Daily  . atorvastatin  80 mg Oral Daily  . carvedilol  25 mg Oral BID WC  . hydrALAZINE  100 mg Oral Q8H  . isosorbide dinitrate  30 mg Oral TID  . nicotine  21 mg Transdermal Daily  . potassium chloride  40 mEq Oral TID  . rivaroxaban  20 mg Oral Q supper  . sodium chloride flush  3 mL Intravenous Q12H  . spironolactone  25 mg Oral Daily  . torsemide  20 mg Oral Daily  . traZODone  50 mg Oral QHS   Continuous Infusions: . sodium chloride     PRN Meds: sodium chloride, acetaminophen, melatonin, ondansetron (ZOFRAN) IV, sodium chloride flush   Vital Signs    Vitals:   12/23/20 2308 12/24/20 0523 12/24/20 0534 12/24/20 0745  BP: (!) 146/93 (!) 168/116 (!) 168/116 (!) 165/115  Pulse: 84 90  88  Resp: '19 16  17  '$ Temp: 98.2 F (36.8 C) 98.2 F (36.8 C)  98.3 F (36.8 C)  TempSrc: Oral Oral  Oral  SpO2: 97% 99%  98%  Weight:  127.7 kg    Height:        Intake/Output Summary (Last 24 hours) at 12/24/2020 0801 Last data filed at 12/24/2020 0748 Gross per 24 hour  Intake --  Output 1350 ml  Net -1350 ml   Last 3 Weights 12/24/2020 12/23/2020 12/22/2020  Weight (lbs) 281 lb 9.6 oz 282 lb 8 oz 281 lb 1.4 oz  Weight (kg) 127.733 kg 128.141 kg 127.5 kg      Telemetry    Sinus with PVCs or aberrant conduction- Personally Reviewed   Physical Exam   GEN: No acute distress.   Neck: No JVD Cardiac: RRR, no murmurs, rubs, or gallops.  Respiratory: Clear to auscultation bilaterally. GI: Soft, nontender, non-distended  MS: No edema Neuro:  Nonfocal  Psych: Normal affect   Labs    High Sensitivity Troponin:   Recent Labs  Lab 12/21/20 0722 12/21/20 0836 12/21/20 1516  TROPONINIHS 1,954* 2,092* 2,272*       Chemistry Recent Labs  Lab 12/23/20 0114 12/23/20 1854 12/24/20 0139  NA 138 137 138  K 3.0* 3.5 3.3*  CL 104 102 104  CO2 '28 27 25  '$ GLUCOSE 109* 115* 119*  BUN 27* 27* 27*  CREATININE 2.22* 2.10* 2.17*  CALCIUM 8.5* 8.9 8.9  PROT 6.3*  --   --   ALBUMIN 2.7* 2.9* 3.0*  AST 20  --   --   ALT 36  --   --   ALKPHOS 73  --   --   BILITOT 0.6  --   --   GFRNONAA 37* 40* 39*  ANIONGAP '6 8 9     '$ Hematology Recent Labs  Lab 12/21/20 0722 12/21/20 1516 12/22/20 0241  WBC 7.7 7.5 6.5  RBC 4.98 4.96 5.04  HGB 10.2* 10.3* 10.4*  HCT 34.5* 34.0* 34.1*  MCV 69.3* 68.5* 67.7*  MCH 20.5* 20.8* 20.6*  MCHC 29.6* 30.3 30.5  RDW 17.7* 17.2* 17.3*  PLT 252 234 257    BNP Recent Labs  Lab 12/21/20 0722  BNP 2,309.0*    Patient Profile     Gerald [name] is a 40yo  man with severe HTN heart disease, chronic combined systolic/diastolic HF and polysubstance abuse who presented with chest pain and was found to have normal coronary arteries but HTN urgency.  Echocardiogram November 2021 showed ejection fraction 30 to 35%, mild left ventricular enlargement, severe left ventricular hypertrophy, grade 2 diastolic dysfunction, severe left atrial enlargement and mildly dilated aortic root.  Cardiac catheterization shows normal coronary arteries, elevated left ventricular end-diastolic pressure at 24 mmHg.  Assessment & Plan    1 acute on chronic combined systolic/diastolic congestive heart failure-volume status has improved since admission.  Resume Demadex 20 mg daily and continue spironolactone.  Follow renal function closely.  2 cardiomyopathy-likely secondary to hypertension.  We will continue carvedilol, hydralazine and nitrates (discontinue Isordil and treat with isosorbide 60 mg daily).  Renal insufficiency appears to be chronic.  We will begin Entresto 24/26 twice daily.  Follow renal function closely.  Titrate medications as tolerated.  3 chronic stage III kidney disease-follow renal  function closely with addition of Entresto.  4 history of paroxysmal atrial fibrillation-patient remains in sinus rhythm.  Continue carvedilol and Xarelto.  5 history of substance abuse-needs discontinuation.  6 microcytic anemia-Per primary care.  For questions or updates, please contact [place] Please consult [email or web address] for contact info under        Signed, Kirk Ruths, MD  12/24/2020, 8:01 AM

## 2020-12-24 NOTE — Discharge Summary (Signed)
Physician Discharge Summary  DAQUAIN CONTORNO II [id number] DOB: 13-Mar-1981 DOA: 12/21/2020  PCP: Patient, [name] (Inactive)  Admit date: 12/21/2020 Discharge date: 12/24/2020  Admitted From: Home Disposition: Left [facility] Course: 40 year old M with PMH of combined CHF, CKD-3B, paroxysmal A. Fib s/p DCCV on Xarelto, HTN, cocaine use, morbid obesity, alcohol abuse and tobacco use presenting with 5 days of progressive dyspnea, cough and chest pain and admitted for acute on chronic combined CHF and hypertensive emergency.  Troponin elevated to 2272 but LHC with clean coronary arteries.  Continues to have dyspnea, orthopnea and markedly elevated blood pressure.  Cardiology adjusting his cardiac medications and diuretics.  Patient decided to leave [place] again today. 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  I have discussed the risks of leaving [place] including serious complication that could potentially lead to death.  Patient states he will go to [facility].  Patient is alert and oriented fully.  He has capacity to make medical decision.  Discharge Diagnoses:  Acute on chronic combined CHF: TTE in 06/2020 with LVEF of 30 to 35%, GH, G2 DD, severe LAE.  Continues to endorse dyspnea and orthopnea.  BP remains elevated.   About 1.4 L UOP/24 hours.  Net -6 L. Fluid status difficult to assess due to body habitus. Cr stable at 2.17. -Cardiology managing-resume Demadex at 20 mg daily -GDMT-now on Entresto 24/26, Coreg 25 mg,, Imdur 60 mg, hydralazine 100 mg -Started Aldactone given hypokalemia -May consider adding Farxiga if okay with his renal function. -Monitor fluid status, renal functions and electrolytes.  Hypertensive emergency: BP remains elevated.  Seems to have persistent hypokalemia that worries me about hyperaldosteronism with his markedly elevated blood pressure.  His renal Doppler in 06/2020 did not reveal  significant stenosis.  He is also at risk for OSA with high STOP-BANG score. -Tolerated CPAP-continue -Cardiac meds as above -Follow renin/aldosterone level  Elevated troponin/demand ischemia: Troponin elevated to 2200 but LHC with large normal coronary arteries. -Cardiac meds as above  Paroxysmal A. Fib/LBBB: Rate controlled -Continue Coreg and Xarelto  Persistent hypokalemia -Continue K-Dur 40 mEq from twice daily to 3 times daily -Continue Aldactone 25 mg daily -Recheck in the morning  Iron deficiency anemia: Iron saturation low.  TIBC on the higher side.  H&H stable. Recent Labs (within last 365 days)           Recent Labs    07/05/20 0204 07/06/20 0306 07/07/20 0234 07/10/20 1734 12/21/20 0722 12/21/20 1516 12/22/20 0241  HGB 9.5* 9.2* 10.1* 10.2* 10.2* 10.3* 10.4*    -IV Feraheme on 5/8 -Monitor intermittently  CKD-3B: Cr slightly up Recent Labs (within last 365 days)               Recent Labs    07/05/20 0204 07/05/20 1210 07/06/20 0306 07/07/20 0234 07/10/20 1734 12/21/20 0722 12/21/20 1516 12/22/20 0241 12/23/20 0114 12/23/20 1854 12/24/20 0139  BUN 22* 19 23* 28* 33* 30*  --  23* 27* 27* 27*  CREATININE 1.61* 1.60* 2.00* 2.12* 1.86* 1.90* 2.01* 1.98* 2.22* 2.10* 2.17*    -Recheck in the morning  Polysubstance use (cocaine, alcohol and tobacco) -Encourage cessation   At risk for sleep apnea: Stop bang score > 4. -Benefit from outpatient sleep study -CPAP at night  Insomnia -Continue melatonin and trazodone  Headache: Likely due to Isordil. -Cardiology change Isordil to Imdur -Ordered as needed fentanyl   Morbid  obesity Body mass index is 39.28 kg/m.            Discharge Exam: Vitals:   12/24/20 0745 12/24/20 1148  BP: (!) 165/115 (!) 162/100  Pulse: 88 81  Resp: 17 18  Temp: 98.3 F (36.8 C) 98.1 F (36.7 C)  SpO2: 98% 98%    GENERAL: No apparent distress.  Nontoxic. HEENT: MMM.  Vision and hearing  grossly intact.  NECK: Supple.  No apparent JVD.  RESP: 98% on RA.  No IWOB.  Fair aeration bilaterally. CVS:  RRR. Heart sounds normal.  ABD/GI/GU: Bowel sounds present. Soft. Non tender.  MSK/EXT:  Moves extremities. No apparent deformity. No edema.  SKIN: no apparent skin lesion or wound NEURO: Awake, alert and oriented appropriately.  No apparent focal neuro deficit. PSYCH: Calm. Normal affect.   Discharge Instructions  Consultations:  Cardiology  Procedures/Studies:  5/6-LHC with very large and normal coronary arteries   DG Chest 2 View  Result Date: 12/21/2020 CLINICAL DATA:  Shortness of breath. Hypertension, systolic CHF, renal insufficiency, cough and congestion. EXAM: CHEST - 2 VIEW COMPARISON:  July 05, 2020. FINDINGS: Enlarged cardiac silhouette, similar to prior. Pulmonary vascular congestion. Hazy diffuse opacities with more confluent airspace opacity at the bases. No visible pleural effusions or pneumothorax. No acute osseous abnormality. IMPRESSION: Cardiomegaly and pulmonary vascular congestion with hazy bilateral opacities and more confluent airspace opacities at the bases. Findings are favored to reflect CHF with alveolar edema, although infection at the lung bases is not excluded. Findings are similar to the prior from July 05, 2020. Electronically Signed   By: Margaretha Sheffield MD   On: 12/21/2020 07:50   CARDIAC CATHETERIZATION  Result Date: 123XX123  LV end diastolic pressure is moderately elevated.  1. Very large and normal coronary arteries 2. Moderately elevated LVEDP 24 mm Hg Plan: focus on BP control and optimization of CHF therapy.        The results of significant diagnostics from this hospitalization (including imaging, microbiology, ancillary and laboratory) are listed below for reference.     Microbiology: Recent Results (from the past 240 hour(s))  Resp Panel by RT-PCR (Flu A&B, Covid) Nasopharyngeal Swab     Status: None   Collection  Time: 12/21/20  7:17 AM   Specimen: Nasopharyngeal Swab; Nasopharyngeal(NP) swabs in vial transport medium  Result Value Ref Range Status   SARS Coronavirus 2 by RT PCR NEGATIVE NEGATIVE Final    Comment: (NOTE) SARS-CoV-2 target nucleic acids are NOT DETECTED.  The SARS-CoV-2 RNA is generally detectable in upper respiratory specimens during the acute phase of infection. The lowest concentration of SARS-CoV-2 viral copies this assay can detect is 138 copies/mL. A negative result does not preclude SARS-Cov-2 infection and should not be used as the sole basis for treatment or other patient management decisions. A negative result may occur with  improper specimen collection/handling, submission of specimen other than nasopharyngeal swab, presence of viral mutation(s) within the areas targeted by this assay, and inadequate number of viral copies(<138 copies/mL). A negative result must be combined with clinical observations, patient history, and epidemiological information. The expected result is Negative.  Fact Sheet for Patients:  EntrepreneurPulse.com.au  Fact Sheet for Healthcare Providers:  IncredibleEmployment.be  This test is no t yet approved or cleared by the [place] FDA and  has been authorized for detection and/or diagnosis of SARS-CoV-2 by FDA under an Emergency Use Authorization (EUA). This EUA will remain  in effect (meaning this test can be used) for  the duration of the COVID-19 declaration under Section 564(b)(1) of the Act, 21 U.S.C.section 360bbb-3(b)(1), unless the authorization is terminated  or revoked sooner.       Influenza A by PCR NEGATIVE NEGATIVE Final   Influenza B by PCR NEGATIVE NEGATIVE Final    Comment: (NOTE) The Xpert Xpress SARS-CoV-2/FLU/RSV plus assay is intended as an aid in the diagnosis of influenza from Nasopharyngeal swab specimens and should not be used as a sole basis for treatment. Nasal washings  and aspirates are unacceptable for Xpert Xpress SARS-CoV-2/FLU/RSV testing.  Fact Sheet for Patients: EntrepreneurPulse.com.au  Fact Sheet for Healthcare Providers: IncredibleEmployment.be  This test is not yet approved or cleared by the [place] FDA and has been authorized for detection and/or diagnosis of SARS-CoV-2 by FDA under an Emergency Use Authorization (EUA). This EUA will remain in effect (meaning this test can be used) for the duration of the COVID-19 declaration under Section 564(b)(1) of the Act, 21 U.S.C. section 360bbb-3(b)(1), unless the authorization is terminated or revoked.  Performed at [facility], [place] [address] [place], [place] [address]   MRSA PCR Screening     Status: None   Collection Time: 12/21/20  1:02 PM   Specimen: Nasal Mucosa; Nasopharyngeal  Result Value Ref Range Status   MRSA by PCR NEGATIVE NEGATIVE Final    Comment:        The GeneXpert MRSA Assay (FDA approved for NASAL specimens only), is one component of a comprehensive MRSA colonization surveillance program. It is not intended to diagnose MRSA infection nor to guide or monitor treatment for MRSA infections. Performed at [facility], [place] [address], [place], [place] [address]      Labs:  CBC: Recent Labs  Lab 12/21/20 0722 12/21/20 1516 12/22/20 0241  WBC 7.7 7.5 6.5  NEUTROABS 5.8  --   --   HGB 10.2* 10.3* 10.4*  HCT 34.5* 34.0* 34.1*  MCV 69.3* 68.5* 67.7*  PLT 252 234 257   BMP &GFR Recent Labs  Lab 12/21/20 0722 12/21/20 1516 12/22/20 0241 12/23/20 0114 12/23/20 1854 12/24/20 0139  NA 139  --  138 138 137 138  K 3.0*  --  3.3* 3.0* 3.5 3.3*  CL 105  --  102 104 102 104  CO2 24  --  '26 28 27 25  '$ GLUCOSE 113*  --  107* 109* 115* 119*  BUN 30*  --  23* 27* 27* 27*  CREATININE 1.90* 2.01* 1.98* 2.22* 2.10* 2.17*  CALCIUM 8.6*  --  8.3* 8.5* 8.9 8.9  MG 2.1  --   --  1.9  --  2.0  PHOS   --   --   --   --  2.7 3.3   Estimated Creatinine Clearance: 61.6 mL/min (A) (by C-G formula based on SCr of 2.17 mg/dL (H)). Liver & Pancreas: Recent Labs  Lab 12/23/20 0114 12/23/20 1854 12/24/20 0139  AST 20  --   --   ALT 36  --   --   ALKPHOS 73  --   --   BILITOT 0.6  --   --   PROT 6.3*  --   --   ALBUMIN 2.7* 2.9* 3.0*   No results for input(s): LIPASE, AMYLASE in the last 168 hours. No results for input(s): AMMONIA in the last 168 hours. Diabetic: Recent Labs    12/22/20 0241  HGBA1C 6.1*   No results for input(s): GLUCAP in the last 168 hours. Cardiac Enzymes: No  results for input(s): CKTOTAL, CKMB, CKMBINDEX, TROPONINI in the last 168 hours. No results for input(s): PROBNP in the last 8760 hours. Coagulation Profile: Recent Labs  Lab 12/21/20 1109  INR 1.1   Thyroid Function Tests: No results for input(s): TSH, T4TOTAL, FREET4, T3FREE, THYROIDAB in the last 72 hours. Lipid Profile: No results for input(s): CHOL, HDL, LDLCALC, TRIG, CHOLHDL, LDLDIRECT in the last 72 hours. Anemia Panel: Recent Labs    12/22/20 0241 12/24/20 0139  VITAMINB12  --  643  FOLATE  --  16.9  FERRITIN 73  --   TIBC 305  --   IRON 19*  --    Urine analysis: No results found for: COLORURINE, APPEARANCEUR, LABSPEC, PHURINE, GLUCOSEU, HGBUR, BILIRUBINUR, KETONESUR, PROTEINUR, UROBILINOGEN, NITRITE, LEUKOCYTESUR Sepsis Labs: Invalid input(s): PROCALCITONIN, LACTICIDVEN   Time coordinating discharge: 35 minutes  SIGNED:  Mercy Riding, MD  [facility] 12/24/2020, 7:20 PM  If 7PM-7AM, please contact night-coverage [email or web address]

## 2020-12-28 LAB — ALDOSTERONE + RENIN ACTIVITY W/ RATIO
ALDO / PRA Ratio: 3.7 (ref 0.0–30.0)
Aldosterone: 9.5 ng/dL (ref 0.0–30.0)
PRA LC/MS/MS: 2.575 ng/mL/hr (ref 0.167–5.380)

## 2021-01-20 ENCOUNTER — Emergency Department (HOSPITAL_COMMUNITY): Payer: Self-pay

## 2021-01-20 ENCOUNTER — Other Ambulatory Visit: Payer: Self-pay

## 2021-01-20 ENCOUNTER — Inpatient Hospital Stay (HOSPITAL_COMMUNITY): Payer: Self-pay

## 2021-01-20 ENCOUNTER — Encounter (HOSPITAL_COMMUNITY): Payer: Self-pay | Admitting: Emergency Medicine

## 2021-01-20 ENCOUNTER — Inpatient Hospital Stay (HOSPITAL_COMMUNITY)
Admission: EM | Admit: 2021-01-20 | Discharge: 2021-01-23 | DRG: 291 | Disposition: A | Discharge status: Home / Self Care | Payer: Self-pay | Attending: Internal Medicine | Admitting: Internal Medicine

## 2021-01-20 DIAGNOSIS — N1832 Chronic kidney disease, stage 3b: Secondary | ICD-10-CM | POA: Diagnosis present

## 2021-01-20 DIAGNOSIS — Z6839 Body mass index (BMI) 39.0-39.9, adult: Secondary | ICD-10-CM

## 2021-01-20 DIAGNOSIS — Z8249 Family history of ischemic heart disease and other diseases of the circulatory system: Secondary | ICD-10-CM

## 2021-01-20 DIAGNOSIS — N1831 Chronic kidney disease, stage 3a: Secondary | ICD-10-CM | POA: Diagnosis present

## 2021-01-20 DIAGNOSIS — I480 Paroxysmal atrial fibrillation: Secondary | ICD-10-CM | POA: Diagnosis present

## 2021-01-20 DIAGNOSIS — F1210 Cannabis abuse, uncomplicated: Secondary | ICD-10-CM | POA: Diagnosis present

## 2021-01-20 DIAGNOSIS — D631 Anemia in chronic kidney disease: Secondary | ICD-10-CM | POA: Diagnosis present

## 2021-01-20 DIAGNOSIS — F17210 Nicotine dependence, cigarettes, uncomplicated: Secondary | ICD-10-CM | POA: Diagnosis present

## 2021-01-20 DIAGNOSIS — I130 Hypertensive heart and chronic kidney disease with heart failure and stage 1 through stage 4 chronic kidney disease, or unspecified chronic kidney disease: Principal | ICD-10-CM | POA: Diagnosis present

## 2021-01-20 DIAGNOSIS — I169 Hypertensive crisis, unspecified: Secondary | ICD-10-CM | POA: Diagnosis present

## 2021-01-20 DIAGNOSIS — R042 Hemoptysis: Secondary | ICD-10-CM | POA: Diagnosis present

## 2021-01-20 DIAGNOSIS — E785 Hyperlipidemia, unspecified: Secondary | ICD-10-CM | POA: Diagnosis present

## 2021-01-20 DIAGNOSIS — E6609 Other obesity due to excess calories: Secondary | ICD-10-CM

## 2021-01-20 DIAGNOSIS — I160 Hypertensive urgency: Secondary | ICD-10-CM | POA: Diagnosis present

## 2021-01-20 DIAGNOSIS — I472 Ventricular tachycardia: Secondary | ICD-10-CM | POA: Diagnosis present

## 2021-01-20 DIAGNOSIS — I5031 Acute diastolic (congestive) heart failure: Secondary | ICD-10-CM

## 2021-01-20 DIAGNOSIS — I447 Left bundle-branch block, unspecified: Secondary | ICD-10-CM | POA: Diagnosis present

## 2021-01-20 DIAGNOSIS — Z7901 Long term (current) use of anticoagulants: Secondary | ICD-10-CM

## 2021-01-20 DIAGNOSIS — I428 Other cardiomyopathies: Secondary | ICD-10-CM | POA: Diagnosis present

## 2021-01-20 DIAGNOSIS — J189 Pneumonia, unspecified organism: Secondary | ICD-10-CM | POA: Diagnosis present

## 2021-01-20 DIAGNOSIS — I248 Other forms of acute ischemic heart disease: Secondary | ICD-10-CM | POA: Diagnosis present

## 2021-01-20 DIAGNOSIS — F1910 Other psychoactive substance abuse, uncomplicated: Secondary | ICD-10-CM | POA: Diagnosis present

## 2021-01-20 DIAGNOSIS — I509 Heart failure, unspecified: Secondary | ICD-10-CM

## 2021-01-20 DIAGNOSIS — J9601 Acute respiratory failure with hypoxia: Secondary | ICD-10-CM | POA: Diagnosis present

## 2021-01-20 DIAGNOSIS — Z79899 Other long term (current) drug therapy: Secondary | ICD-10-CM

## 2021-01-20 DIAGNOSIS — I10 Essential (primary) hypertension: Secondary | ICD-10-CM | POA: Diagnosis present

## 2021-01-20 DIAGNOSIS — N183 Chronic kidney disease, stage 3 unspecified: Secondary | ICD-10-CM | POA: Diagnosis present

## 2021-01-20 DIAGNOSIS — I5043 Acute on chronic combined systolic (congestive) and diastolic (congestive) heart failure: Secondary | ICD-10-CM | POA: Diagnosis present

## 2021-01-20 DIAGNOSIS — Z9114 Patient's other noncompliance with medication regimen: Secondary | ICD-10-CM

## 2021-01-20 DIAGNOSIS — Z20822 Contact with and (suspected) exposure to covid-19: Secondary | ICD-10-CM | POA: Diagnosis present

## 2021-01-20 DIAGNOSIS — F1410 Cocaine abuse, uncomplicated: Secondary | ICD-10-CM | POA: Diagnosis present

## 2021-01-20 DIAGNOSIS — J9690 Respiratory failure, unspecified, unspecified whether with hypoxia or hypercapnia: Secondary | ICD-10-CM

## 2021-01-20 DIAGNOSIS — E876 Hypokalemia: Secondary | ICD-10-CM | POA: Diagnosis present

## 2021-01-20 HISTORY — DX: Hypertensive crisis, unspecified: I16.9

## 2021-01-20 LAB — LIPID PANEL
Cholesterol: 163 mg/dL (ref 0–200)
HDL: 44 mg/dL (ref >40)
LDL Cholesterol: 107 mg/dL — ABNORMAL HIGH (ref 0–99)
Total CHOL/HDL Ratio: 3.7 RATIO
Triglycerides: 61 mg/dL (ref <150)
VLDL: 12 mg/dL (ref 0–40)

## 2021-01-20 LAB — RAPID URINE DRUG SCREEN, HOSP PERFORMED
Amphetamines: NOT DETECTED
Barbiturates: NOT DETECTED
Benzodiazepines: NOT DETECTED
Cocaine: NOT DETECTED
Opiates: NOT DETECTED
Tetrahydrocannabinol: POSITIVE — AB

## 2021-01-20 LAB — LACTIC ACID, PLASMA
Lactic Acid, Venous: 1.1 mmol/L (ref 0.5–1.9)
Lactic Acid, Venous: 1 mmol/L (ref 0.5–1.9)

## 2021-01-20 LAB — CBC
HCT: 37.1 % — ABNORMAL LOW (ref 39.0–52.0)
Hemoglobin: 11.4 g/dL — ABNORMAL LOW (ref 13.0–17.0)
MCH: 21.2 pg — ABNORMAL LOW (ref 26.0–34.0)
MCHC: 30.7 g/dL (ref 30.0–36.0)
MCV: 68.8 fL — ABNORMAL LOW (ref 80.0–100.0)
Platelets: 208 10*3/uL (ref 150–400)
RBC: 5.39 MIL/uL (ref 4.22–5.81)
RDW: 18.3 % — ABNORMAL HIGH (ref 11.5–15.5)
WBC: 8.4 10*3/uL (ref 4.0–10.5)
nRBC: 0 % (ref 0.0–0.2)

## 2021-01-20 LAB — TROPONIN I (HIGH SENSITIVITY)
Troponin I (High Sensitivity): 628 ng/L (ref <18)
Troponin I (High Sensitivity): 611 ng/L (ref <18)

## 2021-01-20 LAB — BASIC METABOLIC PANEL
Anion gap: 9 (ref 5–15)
BUN: 22 mg/dL — ABNORMAL HIGH (ref 6–20)
CO2: 22 mmol/L (ref 22–32)
Calcium: 9 mg/dL (ref 8.9–10.3)
Chloride: 105 mmol/L (ref 98–111)
Creatinine, Ser: 1.92 mg/dL — ABNORMAL HIGH (ref 0.61–1.24)
GFR, Estimated: 45 mL/min — ABNORMAL LOW (ref >60)
Glucose, Bld: 107 mg/dL — ABNORMAL HIGH (ref 70–99)
Potassium: 2.7 mmol/L — CL (ref 3.5–5.1)
Sodium: 136 mmol/L (ref 135–145)

## 2021-01-20 LAB — BRAIN NATRIURETIC PEPTIDE: B Natriuretic Peptide: 2466.4 pg/mL — ABNORMAL HIGH (ref 0.0–100.0)

## 2021-01-20 LAB — RESP PANEL BY RT-PCR (FLU A&B, COVID) ARPGX2
Influenza A by PCR: NEGATIVE
Influenza B by PCR: NEGATIVE
SARS Coronavirus 2 by RT PCR: NEGATIVE

## 2021-01-20 LAB — PROCALCITONIN: Procalcitonin: <0.1 ng/mL

## 2021-01-20 LAB — MAGNESIUM: Magnesium: 2 mg/dL (ref 1.7–2.4)

## 2021-01-20 LAB — ECHOCARDIOGRAM COMPLETE
Area-P 1/2: 5.66 cm2
S' Lateral: 5.2 cm

## 2021-01-20 MED ORDER — CEFTRIAXONE SODIUM 2 G IJ SOLR
2.0000 g | INTRAMUSCULAR | Status: DC
Start: 2021-01-21 — End: 2021-01-20

## 2021-01-20 MED ORDER — BISACODYL 5 MG PO TBEC: 5.0000 mg | DELAYED_RELEASE_TABLET | Freq: Every day | ORAL | Status: DC | PRN

## 2021-01-20 MED ORDER — SACUBITRIL-VALSARTAN 24-26 MG PO TABS
1.0000 | ORAL_TABLET | Freq: Two times a day (BID) | ORAL | Status: DC
  Administered 2021-01-20 – 2021-01-23 (×7): 1 via ORAL
  Filled 2021-01-20 (×8): qty 1

## 2021-01-20 MED ORDER — HYDRALAZINE HCL 25 MG PO TABS
100.0000 mg | ORAL_TABLET | Freq: Two times a day (BID) | ORAL | Status: DC
  Administered 2021-01-20 – 2021-01-21 (×3): 100 mg via ORAL
  Filled 2021-01-20 (×3): qty 4

## 2021-01-20 MED ORDER — ASPIRIN EC 81 MG PO TBEC
81.0000 mg | DELAYED_RELEASE_TABLET | Freq: Every day | ORAL | Status: DC
  Administered 2021-01-20 – 2021-01-23 (×4): 81 mg via ORAL
  Filled 2021-01-20 (×4): qty 1

## 2021-01-20 MED ORDER — ENOXAPARIN SODIUM 40 MG/0.4ML IJ SOSY
40.0000 mg | PREFILLED_SYRINGE | INTRAMUSCULAR | Status: DC
  Administered 2021-01-20: 40 mg via SUBCUTANEOUS
  Filled 2021-01-20: qty 0.4

## 2021-01-20 MED ORDER — HYDRALAZINE HCL 20 MG/ML IJ SOLN: 5.0000 mg | INTRAMUSCULAR | Status: DC | PRN

## 2021-01-20 MED ORDER — SODIUM CHLORIDE 0.9 % IV SOLN
500.0000 mg | INTRAVENOUS | Status: DC
Start: 2021-01-21 — End: 2021-01-20

## 2021-01-20 MED ORDER — THIAMINE HCL 100 MG PO TABS
100.0000 mg | ORAL_TABLET | Freq: Every day | ORAL | Status: DC
  Administered 2021-01-20 – 2021-01-23 (×4): 100 mg via ORAL
  Filled 2021-01-20 (×4): qty 1

## 2021-01-20 MED ORDER — ATORVASTATIN CALCIUM 40 MG PO TABS
40.0000 mg | ORAL_TABLET | Freq: Every day | ORAL | Status: DC
  Administered 2021-01-20 – 2021-01-23 (×4): 40 mg via ORAL
  Filled 2021-01-20 (×4): qty 1

## 2021-01-20 MED ORDER — THIAMINE HCL 100 MG/ML IJ SOLN: 100.0000 mg | Freq: Every day | INTRAMUSCULAR | Status: DC

## 2021-01-20 MED ORDER — LORAZEPAM 2 MG/ML IJ SOLN
0.0000 mg | Freq: Two times a day (BID) | INTRAMUSCULAR | Status: DC
  Administered 2021-01-22: 2 mg via INTRAVENOUS
  Filled 2021-01-20: qty 1

## 2021-01-20 MED ORDER — POTASSIUM CHLORIDE CRYS ER 20 MEQ PO TBCR
40.0000 meq | EXTENDED_RELEASE_TABLET | Freq: Once | ORAL | Status: AC
  Administered 2021-01-20: 40 meq via ORAL
  Filled 2021-01-20: qty 2

## 2021-01-20 MED ORDER — HYDRALAZINE HCL 25 MG PO TABS
100.0000 mg | ORAL_TABLET | Freq: Once | ORAL | Status: AC
  Administered 2021-01-20: 100 mg via ORAL
  Filled 2021-01-20: qty 4

## 2021-01-20 MED ORDER — NITROGLYCERIN 0.4 MG SL SUBL
0.4000 mg | SUBLINGUAL_TABLET | Freq: Once | SUBLINGUAL | Status: AC
  Administered 2021-01-20: 0.4 mg via SUBLINGUAL
  Filled 2021-01-20: qty 1

## 2021-01-20 MED ORDER — POLYETHYLENE GLYCOL 3350 17 G PO PACK: 17.0000 g | PACK | Freq: Every day | ORAL | Status: DC | PRN

## 2021-01-20 MED ORDER — ONDANSETRON HCL 4 MG PO TABS: 4.0000 mg | ORAL_TABLET | Freq: Four times a day (QID) | ORAL | Status: DC | PRN

## 2021-01-20 MED ORDER — ONDANSETRON HCL 4 MG/2ML IJ SOLN: 4.0000 mg | Freq: Four times a day (QID) | INTRAMUSCULAR | Status: DC | PRN

## 2021-01-20 MED ORDER — LORAZEPAM 2 MG/ML IJ SOLN
1.0000 mg | INTRAMUSCULAR | Status: AC | PRN
Start: 2021-01-20 — End: 2021-01-23

## 2021-01-20 MED ORDER — CARVEDILOL 25 MG PO TABS
25.0000 mg | ORAL_TABLET | Freq: Two times a day (BID) | ORAL | Status: DC
  Administered 2021-01-20 – 2021-01-23 (×6): 25 mg via ORAL
  Filled 2021-01-20 (×6): qty 1

## 2021-01-20 MED ORDER — CARVEDILOL 12.5 MG PO TABS
25.0000 mg | ORAL_TABLET | Freq: Once | ORAL | Status: AC
  Administered 2021-01-20: 25 mg via ORAL
  Filled 2021-01-20: qty 2

## 2021-01-20 MED ORDER — FENTANYL CITRATE (PF) 100 MCG/2ML IJ SOLN: 50.0000 ug | Freq: Once | INTRAMUSCULAR | Status: DC

## 2021-01-20 MED ORDER — SODIUM CHLORIDE 0.9% FLUSH
3.0000 mL | Freq: Two times a day (BID) | INTRAVENOUS | Status: DC
  Administered 2021-01-20 – 2021-01-23 (×7): 3 mL via INTRAVENOUS

## 2021-01-20 MED ORDER — CEFTRIAXONE SODIUM 1 G IJ SOLR
1.0000 g | Freq: Once | INTRAMUSCULAR | Status: AC
  Administered 2021-01-20: 1 g via INTRAVENOUS
  Filled 2021-01-20: qty 10

## 2021-01-20 MED ORDER — FUROSEMIDE 10 MG/ML IJ SOLN
60.0000 mg | Freq: Once | INTRAMUSCULAR | Status: AC
  Administered 2021-01-20: 60 mg via INTRAVENOUS
  Filled 2021-01-20: qty 6

## 2021-01-20 MED ORDER — SPIRONOLACTONE 25 MG PO TABS
25.0000 mg | ORAL_TABLET | Freq: Every day | ORAL | Status: DC
  Administered 2021-01-21 – 2021-01-23 (×3): 25 mg via ORAL
  Filled 2021-01-20 (×3): qty 1

## 2021-01-20 MED ORDER — SODIUM CHLORIDE 0.9 % IV SOLN
2.0000 g | INTRAVENOUS | Status: DC
  Administered 2021-01-21 – 2021-01-23 (×3): 2 g via INTRAVENOUS
  Filled 2021-01-20: qty 20
  Filled 2021-01-20 (×2): qty 2

## 2021-01-20 MED ORDER — LORAZEPAM 1 MG PO TABS
1.0000 mg | ORAL_TABLET | ORAL | Status: AC | PRN
  Administered 2021-01-20 – 2021-01-22 (×2): 1 mg via ORAL
  Filled 2021-01-20 (×2): qty 1

## 2021-01-20 MED ORDER — FOLIC ACID 1 MG PO TABS
1.0000 mg | ORAL_TABLET | Freq: Every day | ORAL | Status: DC
  Administered 2021-01-20 – 2021-01-23 (×4): 1 mg via ORAL
  Filled 2021-01-20 (×4): qty 1

## 2021-01-20 MED ORDER — ACETAMINOPHEN 325 MG PO TABS
650.0000 mg | ORAL_TABLET | Freq: Four times a day (QID) | ORAL | Status: DC | PRN
  Administered 2021-01-20 – 2021-01-21 (×2): 650 mg via ORAL
  Filled 2021-01-20 (×2): qty 2

## 2021-01-20 MED ORDER — SODIUM CHLORIDE 0.9 % IV SOLN
500.0000 mg | INTRAVENOUS | Status: DC
  Filled 2021-01-20: qty 500

## 2021-01-20 MED ORDER — HYDROCOD POLST-CPM POLST ER 10-8 MG/5ML PO SUER
5.0000 mL | Freq: Once | ORAL | Status: AC
  Administered 2021-01-20: 5 mL via ORAL
  Filled 2021-01-20 (×2): qty 5

## 2021-01-20 MED ORDER — LORAZEPAM 2 MG/ML IJ SOLN: 0.0000 mg | Freq: Four times a day (QID) | INTRAMUSCULAR | Status: AC

## 2021-01-20 MED ORDER — ACETAMINOPHEN 650 MG RE SUPP: 650.0000 mg | Freq: Four times a day (QID) | RECTAL | Status: DC | PRN

## 2021-01-20 MED ORDER — FUROSEMIDE 10 MG/ML IJ SOLN
40.0000 mg | Freq: Two times a day (BID) | INTRAMUSCULAR | Status: DC
  Administered 2021-01-20 – 2021-01-22 (×4): 40 mg via INTRAVENOUS
  Filled 2021-01-20 (×4): qty 4

## 2021-01-20 MED ORDER — SODIUM CHLORIDE 0.9 % IV SOLN
500.0000 mg | Freq: Once | INTRAVENOUS | Status: AC
  Administered 2021-01-20: 500 mg via INTRAVENOUS
  Filled 2021-01-20: qty 500

## 2021-01-20 MED ORDER — POTASSIUM CHLORIDE 10 MEQ/100ML IV SOLN
10.0000 meq | INTRAVENOUS | Status: AC
  Administered 2021-01-20 (×2): 10 meq via INTRAVENOUS
  Filled 2021-01-20: qty 100

## 2021-01-20 MED ORDER — DOCUSATE SODIUM 100 MG PO CAPS
100.0000 mg | ORAL_CAPSULE | Freq: Two times a day (BID) | ORAL | Status: DC
  Administered 2021-01-21 – 2021-01-22 (×2): 100 mg via ORAL
  Filled 2021-01-20 (×4): qty 1

## 2021-01-20 MED ORDER — ADULT MULTIVITAMIN W/MINERALS CH
1.0000 | ORAL_TABLET | Freq: Every day | ORAL | Status: DC
  Administered 2021-01-20 – 2021-01-23 (×4): 1 via ORAL
  Filled 2021-01-20 (×5): qty 1

## 2021-01-20 MED ORDER — NICOTINE 14 MG/24HR TD PT24
14.0000 mg | MEDICATED_PATCH | Freq: Every day | TRANSDERMAL | Status: DC
  Administered 2021-01-20 – 2021-01-23 (×4): 14 mg via TRANSDERMAL
  Filled 2021-01-20 (×4): qty 1

## 2021-01-20 NOTE — H&P (Signed)
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 male with medical history significant of chronic combined CHF; stage 3b CKD; afib s/p DCCV on Xarelto; HTN; polysubstance abuse; and morbid obesity presenting with CP and hemoptysis.  Patient was previously admitted from 5/6-9 with acute on chronic combined CHF and HTN emergency; he had troponin elevation and LHC was performed and negative for obstructive CAD.  He left AMA from that hospitalization.  He felt like he wasn't getting enough attention during last hospitalization.  Since then, he has been ok "up until this point."  He was around someone who had pneumonia "and that's what they gave me."  He started noticing issues about 3 days ago with SOB, cough, lightheadedness, CP.  He takes BP medications but ran out last week or last month.  He is not currently taking any medications other than Entresto - does not miss BID dosing for this medication.  Last cocaine use about 2 months ago, or maybe a month ago.  Denies daily drinking, but drinks heavily when he does drink.  No fevers.    ED Course:  Similar to prior - SOB, cough with hemoptysis, hypoxia on 2L.  BNP elevated.  Likely HTN urgency with CHF, possibly superimposed PNA.  Started abx and Lasix and NTG with improved BP.    Review of Systems: As per HPI; otherwise review of systems reviewed and negative.   Ambulatory Status:  Ambulates without assistance  COVID Vaccine Status:  Complete  Past Medical History:  Diagnosis Date  . Arrhythmia   . CHF (congestive heart failure) ([place])   . Dysrhythmia    brief episode afib, cardioverted did not return  . Hypertension   . Polysubstance abuse [facility])     Past Surgical History:   Procedure Laterality Date  . KNEE SURGERY Right   . LEFT HEART CATH AND CORONARY ANGIOGRAPHY N/A 12/21/2020   Procedure: LEFT HEART CATH AND CORONARY ANGIOGRAPHY;  Surgeon: Martinique, Peter M, MD;  Location: [facility];  Service: Cardiovascular;  Laterality: N/A;  . TEE WITHOUT CARDIOVERSION N/A 04/04/2019   Procedure: TRANSESOPHAGEAL ECHOCARDIOGRAM (TEE);  Surgeon: [name], MD;  Location: [facility] OR[facility];  Service: Cardiovascular;  Laterality: N/A;    Social History   Socioeconomic History  . Marital status: Single    Spouse name: Not on file  . Number of children: Not on file  . Years of education: Not on file  . Highest education level: Not on file  Occupational History  . 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 Smoking status: Current Every Day Smoker    Packs/day: 0.75    Years: 28.00    Pack years: 21.00    Types: Cigarettes  . Smokeless tobacco: Never Used  . Tobacco comment: requests patch  Vaping Use  . Vaping Use: Never used  Substance and Sexual Activity  . Alcohol use: Yes    Comment: occas.   . Drug use: Yes    Types: Cocaine, Marijuana    Comment: uses marijuana occasionally, reports last use of cocaine 1 month ago as of 01/20/21  . Sexual activity: Not on file  Other Topics Concern  . Not on file  Social History Narrative  .  Not on file   Social Determinants of Health   Financial Resource Strain: Not on file  Food Insecurity: Not on file  Transportation Needs: Not on file  Physical Activity: Not on file  Stress: Not on file  Social Connections: Not on file  Intimate Partner Violence: Not on file    No Known Allergies  Family History  Problem Relation Age of Onset  . Hypertension Father   . Heart disease Paternal Grandfather     Prior to Admission medications   Medication Sig Start Date End Date Taking? Authorizing Provider  atorvastatin (LIPITOR) 40 MG tablet Take 1 tablet (40 mg total) by mouth daily. 07/07/20   Little Ishikawa, MD  carvedilol (COREG) 25 MG tablet Take 1 tablet (25 mg total) by mouth 2 (two) times daily with a meal. 07/07/20 09/05/20  Little Ishikawa, MD  furosemide (LASIX) 20 MG tablet Take 1 tablet (20 mg total) by mouth daily. 07/08/20   Little Ishikawa, MD  hydrALAZINE (APRESOLINE) 100 MG tablet Take 1 tablet (100 mg total) by mouth 2 (two) times daily. 07/07/20   Little Ishikawa, MD  potassium chloride SA (K-DUR) 20 MEQ tablet Take 1 tablet (20 mEq total) by mouth daily. 05/06/19 07/10/20  Alisa Graff, FNP  rivaroxaban (XARELTO) 20 MG TABS tablet Take 1 tablet (20 mg total) by mouth daily with supper. 07/07/20   Little Ishikawa, MD  sacubitril-valsartan (ENTRESTO) 24-26 MG Take 1 tablet by mouth 2 (two) times daily. 07/10/20   Marrianne Mood D, PA-C  spironolactone (ALDACTONE) 25 MG tablet Take 1 tablet (25 mg total) by mouth daily. 07/08/20   Little Ishikawa, MD    Physical Exam: Vitals:   01/20/21 0915 01/20/21 0930 01/20/21 1000 01/20/21 1024  BP: (!) 160/93   (!) 141/97  Pulse: 99 95 84 84  Resp: (!) 41 (!) 29 (!) 30 (!) 30  Temp:      TempSrc:      SpO2: 95% 96% 98% 99%     . General:  Appears anxious but is in NAD . Eyes:  PERRL, EOMI, normal lids, iris . ENT:  grossly normal hearing, lips & tongue, mmm; appropriate dentition . Neck:  no LAD, masses or thyromegaly . Cardiovascular:  RRR, no m/r/g. 1+ LE edema.  Marland Kitchen Respiratory:   Diffuse rhonchi.  Mildly increased respiratory effort. . Abdomen:  soft, NT, ND . Back:   normal alignment, no CVAT . Skin:  no rash or induration seen on limited exam . Musculoskeletal:  grossly normal tone BUE/BLE, good ROM, no bony abnormality . Psychiatric:  Mildly anxious mood and affect, speech fluent and appropriate, AOx3 . Neurologic:  CN 2-12 grossly intact, moves all extremities in coordinated fashion    Radiological Exams on Admission: Independently reviewed - see discussion in A/P where  applicable  DG Chest 2 View  Result Date: 01/20/2021 CLINICAL DATA:  40 year old male with chest pain shortness of breath and hemoptysis for 3 days. EXAM: CHEST - 2 VIEW COMPARISON:  None. FINDINGS: Chest radiographs 12/21/2020 and earlier.Chronic severe cardiomegaly. Stable lung volumes and mediastinal contours. Visualized tracheal air column is within normal limits. Widespread and confluent but indistinct peribronchial and lower lung opacity now on the right. No superimposed pneumothorax. Left lung ventilation appears stable with mild atelectasis. No acute osseous abnormality identified. Negative visible bowel gas pattern. IMPRESSION: Progressed and now widespread right perihilar and lower lung indistinct pulmonary opacity. Chronic severe cardiomegaly. In this setting top differential considerations  include multilobar right pneumonia, pronounced asymmetric pulmonary edema, pulmonary vasculitis. No pleural effusion. Electronically Signed   By: Genevie Ann M.D.   On: 01/20/2021 07:39    EKG: Independently reviewed.  Sinus tachycardia with rate 117;  LBBB    Labs on Admission: I have personally reviewed the available labs and imaging studies at the time of the admission.  Pertinent labs:   K+ 2.7 BUN 22/Creatinine 1.92/GFR 45 - improved from prior HS troponin 628, 611 BNP 2466.4 WBC 8.4 Hgb 11.4 COVID/flu negative   Assessment/Plan Principal Problem:   Hypertensive crisis Active Problems:   Acute on chronic combined systolic and diastolic CHF (congestive heart failure) ([name])   Essential hypertension   PAF (paroxysmal atrial fibrillation) ([name])   CKD (chronic kidney disease), stage III ([name])   Polysubstance abuse ([name])   Class 2 obesity due to excess calories with body mass index (BMI) of 39.0 to 39.9 in adult    Hypertensive crisis with acute on chronic combined CHF -Patient presenting with chest pain and SOB concerning for hypertensive crisis -He left AMA after his last  hospitalization and has not been taking any medications other than Entresto -He did have evidence of end organ failure, with elevated troponin, BNP -The patient received home meds of Coreg and Hydralazine as well as Lasix in the ER with subsequent significant decrease in BP without apparent difficulty -CXR consistent with significant asymmetric pulmonary edema (EDP was concerned about PNA but this seems less likely; he received one dose of abx in the ER - will check procalcitonin and hold additional antibiotics for now) -Mildly elevated BNP with known baseline -Will admit to progressive care for ongoing monitoring and treament -Elevated but downtrending troponin; will request cardiology consultation -Will resume Coreg, hydralazine -Continue Entresto -Will add prn hydralazine -Will request echocardiogram -Will start ASA -CHF order set utilized via the [facility] -Was given Lasix 60 mg x 1 in ER and will repeat with 40 mg IV BID -Continue [place] O2 prn for now -Stable kidney function at this time, will follow  HTN -As noted above, continue Entresto and resume Coreg  -Will also add prn hydralazine  HLD -Resume Lipitor -Check lipids  Afib -Coreg for rate control -Hold Xarelto for now - if patient is not going to be compliant there is no reason to start  Stage 3b CKD -Appears to be stable at this time -Recheck BMP in AM  Polysubstance abuse -Cessation encouraged; this should be encouraged on an ongoing basis -UDS ordered  Tobacco dependence -Encourage cessation.   -This was discussed with the patient and should be reviewed on an ongoing basis.   -Patch ordered at patient request.  Obesity -BMI 39.28 -Weight loss should be encouraged -Outpatient PCP/bariatric medicine/bariatric surgery f/u encouraged     Note: This patient has been tested and is negative for the novel coronavirus COVID-19.    DVT prophylaxis: Lovenox  Code Status:  Full  Family Communication: None  present Disposition Plan:  The patient is from: home  Anticipated d/c is to: home  Anticipated d/c date will depend on clinical response to treatment, likely 2-4 days  Patient is currently: acutely ill Consults called: Cardiology; [facility] team; Nutritrion Admission status: Admit - It is my clinical opinion that admission to INPATIENT is reasonable and necessary because this patient will require at least 2 midnights in the hospital to treat this condition based on the medical complexity of the problems presented.  Given the aforementioned information, the predictability of an adverse outcome is  felt to be significant.     Karmen Bongo MD [facility]   How to contact the [facility] Attending or Consulting provider [place] or covering provider during after hours [place], for this patient?  1. Check the care team in [facility] and look for a) attending/consulting [facility] provider listed and b) the [facility] team listed 2. Log into [email or web address] and use [facility] universal password to access. If you do not have the password, please contact the hospital operator. 3. Locate the [facility] provider you are looking for under [facility] and page to a number that you can be directly reached. 4. If you still have difficulty reaching the provider, please page the [facility] (Director on Call) for the Hospitalists listed on amion for assistance.   01/20/2021, 12:22 PM

## 2021-01-20 NOTE — ED Notes (Signed)
Orders not completed for ETOH withdrawal guidelines.

## 2021-01-20 NOTE — ED Provider Notes (Signed)
[facility] EMERGENCY DEPARTMENT Provider Note   CSN: [id number] Arrival date & time: 01/20/21  [id number]     History Chief Complaint  Patient presents with  . Chest Pain    Gerald [name] is a 39 y.o. male.  Presented to the ER with concern for chest pain, shortness of breath and cough.  Patient has history of heart failure, recent cath showing normal coronary artery disease.  Hypertension, CKD, A. fib.  Medication noncompliance.  Patient reports over the past few days he has been having steadily worsening shortness of breath and cough.  Has had small amount of blood in some of his cough.  Chest pain is described as pressure, center, nonradiating, no alleviating or rating factors.  Not associated with exertion.  Currently mild.  Shortness of breath is worse with exertion.  Completed chart review, recent discharge summary. HPI     Past Medical History:  Diagnosis Date  . Arrhythmia   . CHF (congestive heart failure) ([place])   . Dysrhythmia    brief episode afib, cardioverted did not return  . Hypertension   . Polysubstance abuse [facility])     Patient Active Problem List   Diagnosis Date Noted  . Hypertensive crisis 01/20/2021  . Polysubstance abuse ([place]) 01/20/2021  . Class 2 obesity due to excess calories with body mass index (BMI) of 39.0 to 39.9 in adult 01/20/2021  . Hypertensive emergency 12/21/2020  . CKD (chronic kidney disease), stage III ([place]) 12/21/2020  . Acute on chronic systolic CHF (congestive heart failure) ([place]) 07/05/2020  . PAF (paroxysmal atrial fibrillation) ([place]) 07/05/2020  . Elevated troponin 07/05/2020  . Microcytic anemia 07/05/2020  . Hypokalemia 07/05/2020  . Malignant hypertension   . Acute on chronic combined systolic and diastolic CHF (congestive heart failure) ([place])   . Atrial flutter ([place])   . Essential hypertension   . Acute systolic congestive heart failure ([place])   . Acute respiratory failure ([place]) 04/01/2019  . NSTEMI (non-ST  elevated myocardial infarction) ([place]) 12/21/2015    Past Surgical History:  Procedure Laterality Date  . KNEE SURGERY Right   . LEFT HEART CATH AND CORONARY ANGIOGRAPHY N/A 12/21/2020   Procedure: LEFT HEART CATH AND CORONARY ANGIOGRAPHY;  Surgeon: Martinique, Peter M, MD;  Location: [facility];  Service: Cardiovascular;  Laterality: N/A;  . TEE WITHOUT CARDIOVERSION N/A 04/04/2019   Procedure: TRANSESOPHAGEAL ECHOCARDIOGRAM (TEE);  Surgeon: [name], MD;  Location: [facility] ORS;  Service: Cardiovascular;  Laterality: N/A;       Family History  Problem Relation Age of Onset  . Hypertension Father   . Heart disease Paternal Grandfather     Social History   Tobacco Use  . Smoking status: Current Every Day Smoker    Packs/day: 0.75    Years: 28.00    Pack years: 21.00    Types: Cigarettes  . Smokeless tobacco: Never Used  . Tobacco comment: requests patch  Vaping Use  . Vaping Use: Never used  Substance Use Topics  . Alcohol use: Yes    Comment: occas.   . Drug use: Yes    Types: Cocaine, Marijuana    Comment: uses marijuana occasionally, reports last use of cocaine 1 month ago as of 01/20/21    Home Medications Prior to Admission medications   Medication Sig Start Date End Date Taking? Authorizing Provider  sacubitril-valsartan (ENTRESTO) 24-26 MG Take 1 tablet by mouth 2 (two) times daily. 07/10/20  Yes Marrianne Mood D, PA-C  atorvastatin (LIPITOR) 40 MG tablet Take 1 tablet (40 mg total) by mouth daily. Patient not taking: Reported on 01/20/2021 07/07/20   Little Ishikawa, MD  carvedilol (COREG) 25 MG tablet Take 1 tablet (25 mg total) by mouth 2 (two) times daily with a meal. Patient not taking: Reported on 01/20/2021 07/07/20 09/05/20  Little Ishikawa, MD  furosemide (LASIX) 20 MG tablet Take 1 tablet (20 mg total) by mouth daily. Patient not taking: Reported on 01/20/2021 07/08/20   Little Ishikawa, MD  hydrALAZINE (APRESOLINE) 100 MG tablet Take 1  tablet (100 mg total) by mouth 2 (two) times daily. Patient not taking: Reported on 01/20/2021 07/07/20   Little Ishikawa, MD  potassium chloride SA (K-DUR) 20 MEQ tablet Take 1 tablet (20 mEq total) by mouth daily. Patient not taking: Reported on 01/20/2021 05/06/19 07/10/20  Alisa Graff, FNP  rivaroxaban (XARELTO) 20 MG TABS tablet Take 1 tablet (20 mg total) by mouth daily with supper. Patient not taking: Reported on 01/20/2021 07/07/20   Little Ishikawa, MD  spironolactone (ALDACTONE) 25 MG tablet Take 1 tablet (25 mg total) by mouth daily. Patient not taking: Reported on 01/20/2021 07/08/20   Little Ishikawa, MD    Allergies    Patient has no known allergies.  Review of Systems   Review of Systems  Constitutional: Positive for fatigue. Negative for chills and fever.  HENT: Negative for ear pain and sore throat.   Eyes: Negative for pain and visual disturbance.  Respiratory: Positive for cough and shortness of breath.   Cardiovascular: Positive for chest pain and palpitations.  Gastrointestinal: Negative for abdominal pain and vomiting.  Genitourinary: Negative for dysuria and hematuria.  Musculoskeletal: Negative for arthralgias and back pain.  Skin: Negative for color change and rash.  Neurological: Negative for seizures and syncope.  All other systems reviewed and are negative.   Physical Exam Updated Vital Signs BP (!) 147/134   Pulse (!) 102   Temp (!) 102.6 F (39.2 C) (Oral)   Resp (!) 32   SpO2 96%   Physical Exam Vitals and nursing note reviewed.  Constitutional:      Appearance: He is well-developed.  HENT:     Head: Normocephalic and atraumatic.  Eyes:     Conjunctiva/sclera: Conjunctivae normal.  Cardiovascular:     Rate and Rhythm: Normal rate and regular rhythm.     Heart sounds: No murmur heard.   Pulmonary:     Comments: Crackles at right chest, mild tachypnea, mild increased work of breathing Abdominal:     Palpations: Abdomen is  soft.     Tenderness: There is no abdominal tenderness.  Musculoskeletal:     Cervical back: Neck supple.  Skin:    General: Skin is warm and dry.     Capillary Refill: Capillary refill takes less than 2 seconds.  Neurological:     Mental Status: He is alert.     ED Results / Procedures / Treatments   Labs (all labs ordered are listed, but only abnormal results are displayed) Labs Reviewed  BASIC METABOLIC PANEL - Abnormal; Notable for the following components:      Result Value   Potassium 2.7 (*)    Glucose, Bld 107 (*)    BUN 22 (*)    Creatinine, Ser 1.92 (*)    GFR, Estimated 45 (*)    All other components within normal limits  CBC - Abnormal; Notable for the following components:   Hemoglobin 11.4 (*)  HCT 37.1 (*)    MCV 68.8 (*)    MCH 21.2 (*)    RDW 18.3 (*)    All other components within normal limits  BRAIN NATRIURETIC PEPTIDE - Abnormal; Notable for the following components:   B Natriuretic Peptide 2,466.4 (*)    All other components within normal limits  RAPID URINE DRUG SCREEN, HOSP PERFORMED - Abnormal; Notable for the following components:   Tetrahydrocannabinol POSITIVE (*)    All other components within normal limits  TROPONIN I (HIGH SENSITIVITY) - Abnormal; Notable for the following components:   Troponin I (High Sensitivity) 628 (*)    All other components within normal limits  TROPONIN I (HIGH SENSITIVITY) - Abnormal; Notable for the following components:   Troponin I (High Sensitivity) 611 (*)    All other components within normal limits  RESP PANEL BY RT-PCR (FLU A&B, COVID) ARPGX2  CULTURE, BLOOD (ROUTINE X 2)  CULTURE, BLOOD (ROUTINE X 2)  MAGNESIUM  PROCALCITONIN  LIPID PANEL  LACTIC ACID, PLASMA  LACTIC ACID, PLASMA    EKG EKG Interpretation  Date/Time:  Sunday January 20 2021 08:17:34 EDT Ventricular Rate:  117 PR Interval:  114 QRS Duration: 178 QT Interval:  422 QTC Calculation: 589 R Axis:   -56 Text Interpretation: Sinus  or ectopic atrial tachycardia Left bundle branch block No acute changes Confirmed by Madalyn Rob [phone]) on 01/20/2021 8:25:36 AM   Radiology DG Chest 2 View  Result Date: 01/20/2021 CLINICAL DATA:  40 year old male with chest pain shortness of breath and hemoptysis for 3 days. EXAM: CHEST - 2 VIEW COMPARISON:  None. FINDINGS: Chest radiographs 12/21/2020 and earlier.Chronic severe cardiomegaly. Stable lung volumes and mediastinal contours. Visualized tracheal air column is within normal limits. Widespread and confluent but indistinct peribronchial and lower lung opacity now on the right. No superimposed pneumothorax. Left lung ventilation appears stable with mild atelectasis. No acute osseous abnormality identified. Negative visible bowel gas pattern. IMPRESSION: Progressed and now widespread right perihilar and lower lung indistinct pulmonary opacity. Chronic severe cardiomegaly. In this setting top differential considerations include multilobar right pneumonia, pronounced asymmetric pulmonary edema, pulmonary vasculitis. No pleural effusion. Electronically Signed   By: Genevie Ann M.D.   On: 01/20/2021 07:39    Procedures .Critical Care Performed by: Lucrezia Starch, MD Authorized by: Lucrezia Starch, MD   Critical care provider statement:    Critical care time (minutes):  45   Critical care was necessary to treat or prevent imminent or life-threatening deterioration of the following conditions:  Respiratory failure and cardiac failure   Critical care was time spent personally by me on the following activities:  Discussions with consultants, evaluation of patient's response to treatment, examination of patient, ordering and performing treatments and interventions, ordering and review of laboratory studies, ordering and review of radiographic studies, pulse oximetry, re-evaluation of patient's condition, obtaining history from patient or surrogate and review of old charts     Medications  Ordered in ED Medications  spironolactone (ALDACTONE) tablet 25 mg (has no administration in time range)  hydrALAZINE (APRESOLINE) tablet 100 mg (has no administration in time range)  carvedilol (COREG) tablet 25 mg (has no administration in time range)  atorvastatin (LIPITOR) tablet 40 mg (has no administration in time range)  sacubitril-valsartan (ENTRESTO) 24-26 mg per tablet (1 tablet Oral Given 01/20/21 1454)  LORazepam (ATIVAN) tablet 1-4 mg (1 mg Oral Given 01/20/21 1224)    Or  LORazepam (ATIVAN) injection 1-4 mg ( Intravenous See Alternative 01/20/21 1224)  thiamine tablet 100 mg (has no administration in time range)    Or  thiamine (B-1) injection 100 mg (has no administration in time range)  folic acid (FOLVITE) tablet 1 mg (has no administration in time range)  multivitamin with minerals tablet 1 tablet (has no administration in time range)  furosemide (LASIX) injection 40 mg (has no administration in time range)  aspirin EC tablet 81 mg (has no administration in time range)  enoxaparin (LOVENOX) injection 40 mg (has no administration in time range)  acetaminophen (TYLENOL) tablet 650 mg (650 mg Oral Given 01/20/21 1520)    Or  acetaminophen (TYLENOL) suppository 650 mg ( Rectal See Alternative 01/20/21 1520)  docusate sodium (COLACE) capsule 100 mg (has no administration in time range)  polyethylene glycol (MIRALAX / GLYCOLAX) packet 17 g (has no administration in time range)  bisacodyl (DULCOLAX) EC tablet 5 mg (has no administration in time range)  ondansetron (ZOFRAN) tablet 4 mg (has no administration in time range)    Or  ondansetron (ZOFRAN) injection 4 mg (has no administration in time range)  nicotine (NICODERM CQ - dosed in mg/24 hours) patch 14 mg (has no administration in time range)  LORazepam (ATIVAN) injection 0-4 mg (has no administration in time range)    Followed by  LORazepam (ATIVAN) injection 0-4 mg (has no administration in time range)  sodium chloride flush  (NS) 0.9 % injection 3 mL (has no administration in time range)  hydrALAZINE (APRESOLINE) injection 5 mg (has no administration in time range)  cefTRIAXone (ROCEPHIN) 2 g in sodium chloride 0.9 % 100 mL IVPB (has no administration in time range)  azithromycin (ZITHROMAX) 500 mg in sodium chloride 0.9 % 250 mL IVPB (has no administration in time range)  cefTRIAXone (ROCEPHIN) 1 g in sodium chloride 0.9 % 100 mL IVPB (0 g Intravenous Stopped 01/20/21 0940)  azithromycin (ZITHROMAX) 500 mg in sodium chloride 0.9 % 250 mL IVPB (0 mg Intravenous Stopped 01/20/21 1514)  hydrALAZINE (APRESOLINE) tablet 100 mg (100 mg Oral Given 01/20/21 0830)  carvedilol (COREG) tablet 25 mg (25 mg Oral Given 01/20/21 0830)  nitroGLYCERIN (NITROSTAT) SL tablet 0.4 mg (0.4 mg Sublingual Given 01/20/21 0856)  chlorpheniramine-HYDROcodone (TUSSIONEX) 10-8 MG/5ML suspension 5 mL (5 mLs Oral Given 01/20/21 0912)  potassium chloride SA (KLOR-CON) CR tablet 40 mEq (40 mEq Oral Given 01/20/21 0856)  potassium chloride 10 mEq in 100 mL IVPB (0 mEq Intravenous Stopped 01/20/21 1118)  furosemide (LASIX) injection 60 mg (60 mg Intravenous Given 01/20/21 1021)    ED Course  I have reviewed the triage vital signs and the nursing notes.  Pertinent labs & imaging results that were available during my care of the patient were reviewed by me and considered in my medical decision making (see chart for details).    MDM Rules/Calculators/A&P                          40 year old male presented to ER with cough, shortness of breath and chest pain.  On exam patient noted to be mildly tachypneic, some crackles on the right side of his chest and borderline hypoxic.  Was started on 2 L nasal cannula.  EKG without new ischemic change.  Troponin elevated.  Recent heart catheterization showed no coronary artery disease, lower suspicion therefore of ACS.  Chest x-ray was concerning for asymmetric pulmonary edema versus pneumonia.  Will treat empirically for both.   Provided antibiotics as well as diuretics.  Will admit  patient to the hospitalist service for further management.   Final Clinical Impression(s) / ED Diagnoses Final diagnoses:  Community acquired pneumonia, unspecified laterality  Respiratory failure, unspecified chronicity, unspecified whether with hypoxia or hypercapnia ([place])  Hypertensive crisis  Heart failure, unspecified HF chronicity, unspecified heart failure type [facility])    Rx / DC Orders ED Discharge Orders    None       Lucrezia Starch, MD 01/20/21 1538

## 2021-01-20 NOTE — Progress Notes (Signed)
*  PRELIMINARY RESULTS* Echocardiogram 2D Echocardiogram has been performed.  Gerald [name] 01/20/2021, 5:05 PM

## 2021-01-20 NOTE — Progress Notes (Addendum)
Patient with temp to 102.6.  He was previously given Rocephin/Azithro x 1 dose.  There may be superimposed PNA here, will continue abx for now.  Procalcitonin pending.  Will add lactate.  Hemodynamically stable at this time.  Focused sepsis order set used.  Carlyon Shadow, M.D.

## 2021-01-20 NOTE — ED Triage Notes (Signed)
Pt to triage via [facility].  C/o pain to center of chest, SOB, and coughing up blood x 3 days.  Didn't take BP meds last night.  ASA '324mg'$ .  CBG 112.  Pt states pain improved with ASA.

## 2021-01-20 NOTE — Progress Notes (Signed)
Code sepsis activated just as pt was leaving the ED. Lactic acid and Blood cultures were not drawn. Called pts. Nurse on [facility] [place] who will notify lab to obtain blood draws.

## 2021-01-20 NOTE — Consult Note (Addendum)
Cardiology Consultation:   Patient ID: Gerald [name] MRN: [id number]; DOB: 09/08/1980  Admit date: 01/20/2021 Date of Consult: 01/20/2021  PCP:  Patient, [name] (Inactive)   [facility] Providers Cardiologist:  Ida Rogue, MD   {  Patient Profile:   Gerald [name] is a 40 y.o. male with past medical history of HFrEF/NICM (EF 30-35% by echo in 06/2020 and cath in 12/2020 showing normal cors --> cardiomyopathy felt to be secondary to uncontrolled HTN), HTN, Stage 3 CKD, paroxysmal atrial fibrillation, tobacco use, cocaine use and medical noncompliance who is being seen 01/20/2021 for the evaluation of CHF at the request of Dr. Lorin Mercy.  History of Present Illness:   Gerald [name] was recently admitted to [facility] last month for an acute CHF exacerbation and chest pain. Had been unable to afford his medications and had only been on Entresto and Hydralazine. Hs Troponin was elevated to 2092 and EKG showed a new LBBB but was concerning for a STEMI given anterior J-point elevation. He underwent a catheterization which showed normal coronary arteries. Medical therapy was titrated but he left AMA on 12/24/2020. Discharge medications included Atorvastatin '40mg'$  daily, Coreg '25mg'$  BID, Lasix '20mg'$  daily, Hydralazine '100mg'$  BID, Xarelto '20mg'$  daily, Entresto 24-'26mg'$  BID and Spironolactone '25mg'$  daily.   He presented back to [facility] ED this morning for evaluation of chest pain, dyspnea and hemoptysis for the past 3 days. He reports worsening dyspnea at rest and with activity for the past several days and says he has been unable to sleep due to shortness of breath. Reports some discomfort along the center of his chest which occurs sporadically. No specific abdominal distention or lower extremity edema. He reports he has only been taking Entresto as this was "the affordable medication" for him as his others were going to cost more than $600.  Says Entresto sometimes makes him feel dizzy.  He has not been  taking a diuretic or any of his other heart medications as outlined above. He does report using Cocaine last week. Has been using intermittently since his mid-20's.   BP was initially elevated to 202/137. Initial labs show WBC 8.4, Hgb 11.4, platelets 208, Na+ 136, K+ 2.7 and creatinine 1.92 (was at 1.9 - 2.2 last admission). Mg 2.0. BNP 2466. COVID negative. Initial Hs Troponin 628 with repeat of 611. EKG shows a wide-complex rhythm, HR 117 with LBBB (difficult to distinguish p-waves but notable on telemetry). CXR shows progression of right perihilar and lower lung indistinct pulmonary opacity with severe cardiomegaly.   He received his PTA PO Coreg and Hydralaine with improvement of BP to 141/97 on most recent check. Also received IV Lasix '60mg'$  and has been started on '40mg'$  BID. Has been started on antibiotic therapy by the admitting team given concerns for PNA.    Past Medical History:  Diagnosis Date  . Arrhythmia   . CHF (congestive heart failure) ([place])   . Dysrhythmia    brief episode afib, cardioverted did not return  . Hypertension   . Polysubstance abuse [facility])     Past Surgical History:  Procedure Laterality Date  . KNEE SURGERY Right   . LEFT HEART CATH AND CORONARY ANGIOGRAPHY N/A 12/21/2020   Procedure: LEFT HEART CATH AND CORONARY ANGIOGRAPHY;  Surgeon: Martinique, Peter M, MD;  Location: [place] CV LAB;  Service: Cardiovascular;  Laterality: N/A;  . TEE WITHOUT CARDIOVERSION N/A 04/04/2019   Procedure: TRANSESOPHAGEAL ECHOCARDIOGRAM (TEE);  Surgeon: [name], MD;  Location:  [facility] ORS;  Service: Cardiovascular;  Laterality: N/A;     Home Medications:  Prior to Admission medications   Medication Sig Start Date End Date Taking? Authorizing Provider  sacubitril-valsartan (ENTRESTO) 24-26 MG Take 1 tablet by mouth 2 (two) times daily. 07/10/20  Yes Visser, Jacquelyn D, PA-C  atorvastatin (LIPITOR) 40 MG tablet Take 1 tablet (40 mg total) by mouth daily. Patient not taking:  Reported on 01/20/2021 07/07/20   Little Ishikawa, MD  carvedilol (COREG) 25 MG tablet Take 1 tablet (25 mg total) by mouth 2 (two) times daily with a meal. Patient not taking: Reported on 01/20/2021 07/07/20 09/05/20  Little Ishikawa, MD  furosemide (LASIX) 20 MG tablet Take 1 tablet (20 mg total) by mouth daily. Patient not taking: Reported on 01/20/2021 07/08/20   Little Ishikawa, MD  hydrALAZINE (APRESOLINE) 100 MG tablet Take 1 tablet (100 mg total) by mouth 2 (two) times daily. Patient not taking: Reported on 01/20/2021 07/07/20   Little Ishikawa, MD  potassium chloride SA (K-DUR) 20 MEQ tablet Take 1 tablet (20 mEq total) by mouth daily. Patient not taking: Reported on 01/20/2021 05/06/19 07/10/20  Alisa Graff, FNP  rivaroxaban (XARELTO) 20 MG TABS tablet Take 1 tablet (20 mg total) by mouth daily with supper. Patient not taking: Reported on 01/20/2021 07/07/20   Little Ishikawa, MD  spironolactone (ALDACTONE) 25 MG tablet Take 1 tablet (25 mg total) by mouth daily. Patient not taking: Reported on 01/20/2021 07/08/20   Little Ishikawa, MD    Inpatient Medications: Scheduled Meds: . aspirin EC  81 mg Oral Daily  . atorvastatin  40 mg Oral Daily  . carvedilol  25 mg Oral BID WC  . docusate sodium  100 mg Oral BID  . enoxaparin (LOVENOX) injection  40 mg Subcutaneous Q24H  . folic acid  1 mg Oral Daily  . furosemide  40 mg Intravenous BID  . hydrALAZINE  100 mg Oral BID  . LORazepam  0-4 mg Intravenous Q6H   Followed by  . [START ON 01/22/2021] LORazepam  0-4 mg Intravenous Q12H  . multivitamin with minerals  1 tablet Oral Daily  . nicotine  14 mg Transdermal Daily  . sacubitril-valsartan  1 tablet Oral BID  . sodium chloride flush  3 mL Intravenous Q12H  . [START ON 01/21/2021] spironolactone  25 mg Oral Daily  . thiamine  100 mg Oral Daily   Or  . thiamine  100 mg Intravenous Daily   Continuous Infusions:  PRN Meds: acetaminophen **OR** acetaminophen,  bisacodyl, hydrALAZINE, LORazepam **OR** LORazepam, ondansetron **OR** ondansetron (ZOFRAN) IV, polyethylene glycol  Allergies:   No Known Allergies  Social History:   Social History   Socioeconomic History  . Marital status: Single    Spouse name: Not on file  . Number of children: Not on file  . Years of education: Not on file  . Highest education level: Not on file  Occupational History  . 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 Smoking status: Current Every Day Smoker    Packs/day: 0.75    Years: 28.00    Pack years: 21.00    Types: Cigarettes  . Smokeless tobacco: Never Used  . Tobacco comment: requests patch  Vaping Use  . Vaping Use: Never used  Substance and Sexual Activity  . Alcohol use: Yes    Comment: occas.   . Drug use: Yes    Types: Cocaine, Marijuana    Comment: uses  marijuana occasionally, reports last use of cocaine 1 month ago as of 01/20/21  . Sexual activity: Not on file  Other Topics Concern  . Not on file  Social History Narrative  . Not on file   Social Determinants of Health   Financial Resource Strain: Not on file  Food Insecurity: Not on file  Transportation Needs: Not on file  Physical Activity: Not on file  Stress: Not on file  Social Connections: Not on file  Intimate Partner Violence: Not on file    Family History:    Family History  Problem Relation Age of Onset  . Hypertension Father   . Heart disease Paternal Grandfather      ROS:  Please see the history of present illness.   All other ROS reviewed and negative.     Physical Exam/Data:   Vitals:   01/20/21 0915 01/20/21 0930 01/20/21 1000 01/20/21 1024  BP: (!) 160/93   (!) 141/97  Pulse: 99 95 84 84  Resp: (!) 41 (!) 29 (!) 30 (!) 30  Temp:      TempSrc:      SpO2: 95% 96% 98% 99%    Intake/Output Summary (Last 24 hours) at 01/20/2021 1359 Last data filed at 01/20/2021 1209 Gross per 24 hour  Intake 291.23 ml  Output 900 ml  Net -608.77 ml   Last  3 Weights 12/24/2020 12/23/2020 12/22/2020  Weight (lbs) 281 lb 9.6 oz 282 lb 8 oz 281 lb 1.4 oz  Weight (kg) 127.733 kg 128.141 kg 127.5 kg     There is no height or weight on file to calculate BMI.  General:  Well nourished, well developed male appearing in no acute distress.  HEENT: normal Lymph: no adenopathy Neck: JVD at 10 cm.  Endocrine:  No thryomegaly Vascular: No carotid bruits; FA pulses 2+ bilaterally without bruits  Cardiac:  normal S1, S2; RRR with frequent ectopic beats.  Lungs: Rales along bases bilaterally.  Abd: soft, nontender, no hepatomegaly  Ext: Trace ankle edema bilaterally. Musculoskeletal:  No deformities, BUE and BLE strength normal and equal Skin: warm and dry  Neuro:  CNs 2-12 intact, no focal abnormalities noted Psych:  Normal affect   EKG:  The EKG was personally reviewed and demonstrates: Wide-complex rhythm, HR 117 with LBBB (difficult to distinguish p-waves but notable on telemetry).  Telemetry:  Telemetry was personally reviewed and demonstrates: Sinus rhythm, HR in 90's to low-100's. Episodes of NSVT up to 6 beats.   Relevant CV Studies:  Echocardiogram: 06/2020 IMPRESSIONS    1. Left ventricular ejection fraction, by estimation, is 30 to 35%. The  left ventricle has moderately decreased function. The left ventricle  demonstrates global hypokinesis. The left ventricular internal cavity size  was mildly dilated. There is severe  left ventricular hypertrophy. Left ventricular diastolic parameters are  consistent with [name] diastolic dysfunction (pseudonormalization).  Elevated left atrial pressure.  2. Right ventricular systolic function is normal. The right ventricular  size is normal. Tricuspid regurgitation signal is inadequate for assessing  PA pressure.  3. Left atrial size was severely dilated.  4. The mitral valve is normal in structure. Trivial mitral valve  regurgitation.  5. The aortic valve is tricuspid. Aortic valve  regurgitation is trivial.  No aortic stenosis is present.  6. Aortic dilatation noted. There is mild dilatation of the aortic root,  measuring 40 mm. There is mild dilatation of the ascending aorta,  measuring 39 mm.  7. The inferior vena cava is dilated  in size with >50% respiratory  variability, suggesting right atrial pressure of 8 mmHg.   LHC: [id number]   LV end diastolic pressure is moderately elevated.   1. Very large and normal coronary arteries 2. Moderately elevated LVEDP 24 mm Hg  Plan: focus on BP control and optimization of CHF therapy.   Laboratory Data:  High Sensitivity Troponin:   Recent Labs  Lab 12/21/20 1516 01/20/21 0708 01/20/21 0848  TROPONINIHS 2,272* 628* 611*     Chemistry Recent Labs  Lab 01/20/21 0708  NA 136  K 2.7*  CL 105  CO2 22  GLUCOSE 107*  BUN 22*  CREATININE 1.92*  CALCIUM 9.0  GFRNONAA 45*  ANIONGAP 9    No results for input(s): PROT, ALBUMIN, AST, ALT, ALKPHOS, BILITOT in the last 168 hours. Hematology Recent Labs  Lab 01/20/21 0708  WBC 8.4  RBC 5.39  HGB 11.4*  HCT 37.1*  MCV 68.8*  MCH 21.2*  MCHC 30.7  RDW 18.3*  PLT 208   BNP Recent Labs  Lab 01/20/21 0837  BNP 2,466.4*    DDimer No results for input(s): DDIMER in the last 168 hours.   Radiology/Studies:  DG Chest 2 View  Result Date: 01/20/2021 CLINICAL DATA:  40 year old male with chest pain shortness of breath and hemoptysis for 3 days. EXAM: CHEST - 2 VIEW COMPARISON:  None. FINDINGS: Chest radiographs 12/21/2020 and earlier.Chronic severe cardiomegaly. Stable lung volumes and mediastinal contours. Visualized tracheal air column is within normal limits. Widespread and confluent but indistinct peribronchial and lower lung opacity now on the right. No superimposed pneumothorax. Left lung ventilation appears stable with mild atelectasis. No acute osseous abnormality identified. Negative visible bowel gas pattern. IMPRESSION: Progressed and now  widespread right perihilar and lower lung indistinct pulmonary opacity. Chronic severe cardiomegaly. In this setting top differential considerations include multilobar right pneumonia, pronounced asymmetric pulmonary edema, pulmonary vasculitis. No pleural effusion. Electronically Signed   By: Genevie Ann M.D.   On: 01/20/2021 07:39     Assessment and Plan:   1. Acute HFrEF/NICM - His EF was at 30-35% by echo in 06/2020 and cath in 12/2020 showed normal cors. He has been noncompliant with medications as he does not like how the medications make him feel but also reports difficulty affording his medications.  -  BNP 2466. Initial Hs Troponin 628 with repeat of 611. CXR shows progression of right perihilar and lower lung indistinct pulmonary opacity with severe cardiomegaly.  - Suspect his troponin elevation is secondary to demand ischemia in the setting of CHF. No plans for ischemic testing at this time given his cath last month. Agree with IV Lasix for diuresis. Patient's nurse reports he has almost urinated 1L since his first dose. Follow I&O's along with daily weights. Repeat BMET in AM.  - He was only taking Entresto prior to admission and this has been restarted at 24-'26mg'$  BID but will need to follow his renal function closely with creatinine at 1.9. Also restarted on Coreg, Spironolactone and Hydralazine. Could consider SGLT-2 inhibitor but would need patient assistance as he is currently without insurance coverage. Would recommend Social Work consult to explore options in regards to medication assistance as he has been poorly compliant with office visits in the outpatient setting where this could be completed or samples provided.   2. Hypertensive Urgency - BP initially elevated to 202/137, improved to 141/97 after being restarted on his PTA medications. Continue to follow BP and titrate medications as BP allows.  3. NSVT - Episodes noted on telemetry. He has been restarted on PTA Coreg. Mg at  2.0. K+ low at 2.7 and replacement has been administered. Keep Mg ~ 2.0 and K+ ~ 4.0.  4. History of Paroxysmal Atrial Fibrillation - Maintaining NSR currently. Listed as being on Xarelto prior to admission but was not taking. Restart Coreg as outlined above.   5. Stage 3 CKD - Creatinine at 1.9 - 2.2 last month, at 1.92 this admission. Continue to follow with diuresis.   6. Substance Abuse - He still smokes approximately 1 ppd and reports using Cocaine last week. Cessation advised.    For questions or updates, please contact [place] Please consult [email or web address] for contact info under    Signed, Erma Heritage, PA-C  01/20/2021 1:59 PM   Attending note:  Patient seen and examined.  I reviewed his records and discussed the case with Ms. Delano Metz, I agree with her above findings.  Gerald [name] presents to the ER reporting chest discomfort in association with shortness of breath for the last few days.  He has only been taking Entresto, no other regular cardiac medications.  He also has a history of substance abuse, reports using cocaine last week. He has a history of nonischemic cardiomyopathy, normal coronaries at cardiac catheterization in May.  LVEF 30 to 35% by last assessment with moderate diastolic dysfunction and normal RV contraction.  Patient presented with marked hypertension, sinus tachycardia by telemetry and also intermittent NSVT.  He was given Lasix along with Coreg and hydralazine resulting in fairly quick improvement in vital signs.  He is afebrile, systolic down to 123456, heart rate in the 90 to low 100 range.  Lungs exhibit bibasilar crackles.  Cardiac exam with rapid regular rhythm and frequent ectopy..  He has trace ankle edema.  Pertinent lab work includes potassium 2.7, BUN 22, creatinine 1.92, BNP 2466, high-sensitivity troponin I of 628 and 611, hemoglobin 11.4, platelets 208, UDS positive for THC but not for cocaine.  Chest x-ray significantly abnormal  with fairly widespread right perihilar and lower lung opacities, asymmetric edema versus possible infectious etiology or vasculitic.  SARS coronavirus 2 test is negative.  Patient being admitted to the hospitalist service for further evaluation. Troponin elevation most consistent with heart failure rather than ACS.  He has evidence of fluid overload and would initiate IV Lasix, otherwise continue Entresto, Coreg, and hydralazine.  Follow-up echocardiogram will be obtained as well.  Replete electrolytes.  Follow blood pressure trend and renal function for further medication adjustments.  Hold off on SGLT2 inhibitor with current renal insufficiency particularly if Delene Loll is to be continued as well as readdition of Aldactone.  Medication and office follow-up compliance will be paramount in his ability to stabilize and recover.  Satira Sark, M.D., F.A.C.C.

## 2021-01-20 NOTE — ED Notes (Signed)
Temp 102.6. MD notified

## 2021-01-21 DIAGNOSIS — I169 Hypertensive crisis, unspecified: Secondary | ICD-10-CM

## 2021-01-21 LAB — BASIC METABOLIC PANEL
Anion gap: 10 (ref 5–15)
Anion gap: 8 (ref 5–15)
BUN: 23 mg/dL — ABNORMAL HIGH (ref 6–20)
BUN: 25 mg/dL — ABNORMAL HIGH (ref 6–20)
CO2: 22 mmol/L (ref 22–32)
CO2: 25 mmol/L (ref 22–32)
Calcium: 8.3 mg/dL — ABNORMAL LOW (ref 8.9–10.3)
Calcium: 8.5 mg/dL — ABNORMAL LOW (ref 8.9–10.3)
Chloride: 105 mmol/L (ref 98–111)
Chloride: 106 mmol/L (ref 98–111)
Creatinine, Ser: 2.02 mg/dL — ABNORMAL HIGH (ref 0.61–1.24)
Creatinine, Ser: 2.1 mg/dL — ABNORMAL HIGH (ref 0.61–1.24)
GFR, Estimated: 42 mL/min — ABNORMAL LOW (ref >60)
GFR, Estimated: 40 mL/min — ABNORMAL LOW (ref >60)
Glucose, Bld: 111 mg/dL — ABNORMAL HIGH (ref 70–99)
Glucose, Bld: 99 mg/dL (ref 70–99)
Potassium: 2.8 mmol/L — ABNORMAL LOW (ref 3.5–5.1)
Potassium: 3.4 mmol/L — ABNORMAL LOW (ref 3.5–5.1)
Sodium: 137 mmol/L (ref 135–145)
Sodium: 139 mmol/L (ref 135–145)

## 2021-01-21 LAB — CBC
HCT: 32.6 % — ABNORMAL LOW (ref 39.0–52.0)
Hemoglobin: 9.9 g/dL — ABNORMAL LOW (ref 13.0–17.0)
MCH: 21 pg — ABNORMAL LOW (ref 26.0–34.0)
MCHC: 30.4 g/dL (ref 30.0–36.0)
MCV: 69.1 fL — ABNORMAL LOW (ref 80.0–100.0)
Platelets: 200 10*3/uL (ref 150–400)
RBC: 4.72 MIL/uL (ref 4.22–5.81)
RDW: 17.9 % — ABNORMAL HIGH (ref 11.5–15.5)
WBC: 8.6 10*3/uL (ref 4.0–10.5)
nRBC: 0 % (ref 0.0–0.2)

## 2021-01-21 MED ORDER — RIVAROXABAN 20 MG PO TABS
20.0000 mg | ORAL_TABLET | Freq: Every day | ORAL | Status: DC
  Administered 2021-01-21 – 2021-01-22 (×2): 20 mg via ORAL
  Filled 2021-01-21 (×2): qty 1

## 2021-01-21 MED ORDER — POTASSIUM CHLORIDE CRYS ER 20 MEQ PO TBCR
60.0000 meq | EXTENDED_RELEASE_TABLET | Freq: Two times a day (BID) | ORAL | Status: AC
  Administered 2021-01-21 (×2): 60 meq via ORAL
  Filled 2021-01-21 (×2): qty 3

## 2021-01-21 MED ORDER — AZITHROMYCIN 250 MG PO TABS
500.0000 mg | ORAL_TABLET | Freq: Every day | ORAL | Status: DC
  Administered 2021-01-21 – 2021-01-23 (×3): 500 mg via ORAL
  Filled 2021-01-21 (×3): qty 2

## 2021-01-21 MED ORDER — POTASSIUM CHLORIDE 10 MEQ/100ML IV SOLN
10.0000 meq | INTRAVENOUS | Status: AC
  Administered 2021-01-21 (×3): 10 meq via INTRAVENOUS
  Filled 2021-01-21 (×3): qty 100

## 2021-01-21 MED ORDER — GUAIFENESIN-DM 100-10 MG/5ML PO SYRP
5.0000 mL | ORAL_SOLUTION | ORAL | Status: DC | PRN
  Administered 2021-01-21: 5 mL via ORAL
  Filled 2021-01-21: qty 5

## 2021-01-21 MED ORDER — BENZONATATE 100 MG PO CAPS
100.0000 mg | ORAL_CAPSULE | Freq: Three times a day (TID) | ORAL | Status: DC | PRN
  Administered 2021-01-21: 100 mg via ORAL
  Filled 2021-01-21: qty 1

## 2021-01-21 NOTE — Progress Notes (Signed)
Heart Failure Stewardship Pharmacist Progress Note   PCP: Patient, [name] (Inactive) PCP-Cardiologist: Ida Rogue, MD    HPI:  40 yo M with PMH of CHF, HTN, afib, CKD IIIa, tobacco use, cocaine abuse, and non-compliance. He presented to the ED on 6/5 with chest pain, shortness of breath, and hemoptysis. CXR showed progressed and now widespread right perihilar and lower lung with indistinct pulmonary opacity and chronic severe cardiomegaly. An ECHO was done on 6/5 and showed LVEF 30-35% (stable from prior in 06/2020) and normal RV systolic function. Recent LHC in May 2022 with large but normal coronary arteries.   Current HF Medications: Furosemide 40 mg IV BID Carvedilol 25 mg BID Entresto 24/26 mg BID Spironolactone 25 mg daily Hydralazine 100 mg BID  Prior to admission HF Medications: Entresto 24/26 mg BID *Not taking carvedilol, hydralazine, spironolactone, or furosemide because he ran out of medications 3-4 months ago  Pertinent Lab Values: . Serum creatinine 2.02, BUN 23, Potassium 2.8, Sodium 137, BNP 2466.4, Magnesium 2.0  Vital Signs: . Weight: 260 lbs (admission weight: 260 lbs) . Blood pressure: 150/100s  . Heart rate: 80s   Medication Assistance / Insurance Benefits Check: Does the patient have prescription insurance?  No  Does the patient qualify for medication assistance through manufacturers or grants?   Yes . Eligible grants and/or patient assistance programs: pending . Medication assistance applications in progress: none  . Medication assistance applications approved: Delene Loll, Xarelto Approved medication assistance renewals will be completed by: [place]:  [facility] admission [facility] Is the patient willing to use [facility] at discharge? Yes Is the patient willing to transition their outpatient pharmacy to utilize a [facility] outpatient pharmacy?   Pending    Assessment: 1. Acute on chronic systolic CHF (EF  99991111), due to NICM. NYHA class III symptoms. - Continue furosemide 40 mg IV BID - Continue carvedilol 25 mg BID - Continue Entresto 24/26 mg BID - Agree with starting spironolactone 25 mg daily today - Continue hydralazine 100 mg BID   Plan: 1) Medication changes recommended at this time: - Agree with changes as above  2) Patient assistance: - Patient previously approved for Entresto patient assistance (through 07/13/21) for $0 per month - it appears he may already be receiving this through the mail as this is the only PTA medication he was able to continue.  - Patient previously approved for Xarelto patient assistance (through 07/20/21) for $0 per month - will inform patient on how to request delivery of medication to his home. Discussed with cardiology and they would like him to resume this since cost will no longer be an issue.  - HF TOC clinic appt set up for 6/14. Will involve CSW to help with insurance and medication accessibility concerns.   3)  Education  - To be completed prior to discharge  Kerby Nora, PharmD, BCPS Heart Failure Stewardship Pharmacist Phone [phone]

## 2021-01-21 NOTE — Progress Notes (Signed)
PROGRESS NOTE    Gerald [name]  [id number] DOB: 05-10-1981 DOA: 01/20/2021 PCP: Patient, [name] (Inactive)    Chief Complaint  Patient presents with  . Chest Pain    Brief Narrative:   Gerald [name] is a 40 y.o. male with medical history significant of chronic combined CHF; stage 3b CKD; afib s/p DCCV on Xarelto; HTN; polysubstance abuse; and morbid obesity presenting with CP and hemoptysis.  Patient was previously admitted from 5/6-9 with acute on chronic combined CHF and HTN emergency; he had troponin elevation and LHC was performed and negative for obstructive CAD.  He left AMA from that hospitalization.  He felt like he wasn't getting enough attention during last hospitalization.  He comes in with sob, cough with hemoptysis, hypoxia requiring about 2 lit of [place] oxygen, febrile and CXR showing pneumonia in addition to volume overload.  Marland Kitchen He is being admitted for acute respiratory failure with hypoxia from a combination of pneumonia and chronic combined CHF.  Assessment & Plan:   Principal Problem:   Hypertensive crisis Active Problems:   Acute on chronic combined systolic and diastolic CHF (congestive heart failure) ([name])   Essential hypertension   PAF (paroxysmal atrial fibrillation) ([name])   CKD (chronic kidney disease), stage III ([name])   Polysubstance abuse ([name])   Class 2 obesity due to excess calories with body mass index (BMI) of 39.0 to 39.9 in adult   Acute respiratory failure with hypoxia sec to a combination of pneumonia and acute on chronic combined CHF.  Currently requiring 2 lit of [place] oxygen to keep sats greater than 90% On IV rocephin, zithromax .  Pro calcitonin and lactate negative and wnl.  Hemoptysis resolved.  Follow up blood cultures and sputum cultures.    Polysubstance abuse:  TOC will be consulted for resources and meds.   Acute on chronic combined systolic and diastolic CHf Secondary to non compliance to meds.  Elevated BNP and elevated  troponin's on admission, elevated troponin's from demand ischemia from CHF.   EKG shows LBBB. Echo shows Left ventricular ejection fraction, is 30 to 35%, has moderately decreased function with global hypokinesis.  There is severe left ventricular hypertrophy. Left ventricular diastolic parameters are indeterminate.  Continue with IV lasix 40 mg BID, on entresto ,spironolactone and coreg 25 mg BID.   Continue with strict intake and output, daily weights.  Diuresed about 2.5 lit since admission.  Pt currently denies any chest pain    Stage 3b CKD;  Creatinine appears to be at baseline.    Body mass index is 36.37 kg/m. Recommend outpatient follow up with PCP for obesity for lifestyle modification.    Essential hypertension  Sub optimal, restarted home meds, continue to monitor.    Paroxysmal atrial fibrillation:  Rate controlled  S/P DCCV,  Was supposed to be on xareto but did not have a prescription for it.  Will restart it today.    Tobacco abuse:  On nicotine patch.    Anemia of chronic disease secondary to CKD.  - Baseline hemoglobin is around 10.  - continue to monitor.    Hypokalemia:  Replaced. Recheck later this evening and supplement.  Check magnesium levels.   DVT prophylaxis: (Lovenox) Code Status: (Full code) Family Communication: none at bedside.  Disposition:   Status is: Inpatient  Remains inpatient appropriate because:Ongoing diagnostic testing needed not appropriate for outpatient work up, Unsafe d/c plan and IV treatments appropriate due to intensity of illness or inability to  take PO   Dispo: The patient is from: Home              Anticipated d/c is to: Home              Patient currently is not medically stable to d/c.   Difficult to place patient: yes        Consultants:  Cardiology.    Procedures: echocardiogram.   Antimicrobials: Antibiotics Given (last 72 hours)    Date/Time Action Medication Dose Rate   01/20/21 0910 New  Bag/Given   cefTRIAXone (ROCEPHIN) 1 g in sodium chloride 0.9 % 100 mL IVPB 1 g 200 mL/hr   01/20/21 1132 New Bag/Given   azithromycin (ZITHROMAX) 500 mg in sodium chloride 0.9 % 250 mL IVPB 500 mg 250 mL/hr          Subjective: Breathing is the same, coughing,no chest pain .   Objective: Vitals:   01/20/21 1718 01/20/21 2232 01/21/21 0004 01/21/21 0509  BP: 129/81 121/74 (!) 155/98 (!) 152/96  Pulse: 92 84 100 97  Resp:  '18 16 16  '$ Temp:  98.3 F (36.8 C) 98.6 F (37 C) 98.5 F (36.9 C)  TempSrc:  Oral Oral Oral  SpO2: 94% 95% 97% 100%  Weight:    118.3 kg    Intake/Output Summary (Last 24 hours) at 01/21/2021 0827 Last data filed at 01/21/2021 0400 Gross per 24 hour  Intake 651.23 ml  Output 2850 ml  Net -2198.77 ml   Filed Weights   01/21/21 0509  Weight: 118.3 kg    Examination:  General exam: Appears calm and comfortable  Respiratory system: Clear to auscultation. Respiratory effort normal. Cardiovascular system: S1 & S2 heard, RRR. No JVD, murmurs,. No pedal edema. Gastrointestinal system: Abdomen is nondistended, soft and nontender.  Normal bowel sounds heard. Central nervous system: Alert and oriented. No focal neurological deficits. Extremities: Symmetric 5 x 5 power. Skin: No rashes, lesions or ulcers Psychiatry: Mood & affect appropriate.     Data Reviewed: I have personally reviewed following labs and imaging studies  CBC: Recent Labs  Lab 01/20/21 0708 01/21/21 0456  WBC 8.4 8.6  HGB 11.4* 9.9*  HCT 37.1* 32.6*  MCV 68.8* 69.1*  PLT 208 [id number]    Basic Metabolic Panel: Recent Labs  Lab 01/20/21 0708 01/20/21 0838 01/21/21 0456  NA 136  --  137  K 2.7*  --  2.8*  CL 105  --  105  CO2 22  --  22  GLUCOSE 107*  --  111*  BUN 22*  --  23*  CREATININE 1.92*  --  2.02*  CALCIUM 9.0  --  8.3*  MG  --  2.0  --     GFR: Estimated Creatinine Clearance: 63.6 mL/min (A) (by C-G formula based on SCr of 2.02 mg/dL (H)).  Liver Function  Tests: No results for input(s): AST, ALT, ALKPHOS, BILITOT, PROT, ALBUMIN in the last 168 hours.  CBG: No results for input(s): GLUCAP in the last 168 hours.   Recent Results (from the past 240 hour(s))  Resp Panel by RT-PCR (Flu A&B, Covid) Nasopharyngeal Swab     Status: None   Collection Time: 01/20/21  8:37 AM   Specimen: Nasopharyngeal Swab; Nasopharyngeal(NP) swabs in vial transport medium  Result Value Ref Range Status   SARS Coronavirus 2 by RT PCR NEGATIVE NEGATIVE Final    Comment: (NOTE) SARS-CoV-2 target nucleic acids are NOT DETECTED.  The SARS-CoV-2 RNA is generally detectable in upper  respiratory specimens during the acute phase of infection. The lowest concentration of SARS-CoV-2 viral copies this assay can detect is 138 copies/mL. A negative result does not preclude SARS-Cov-2 infection and should not be used as the sole basis for treatment or other patient management decisions. A negative result may occur with  improper specimen collection/handling, submission of specimen other than nasopharyngeal swab, presence of viral mutation(s) within the areas targeted by this assay, and inadequate number of viral copies(<138 copies/mL). A negative result must be combined with clinical observations, patient history, and epidemiological information. The expected result is Negative.  Fact Sheet for Patients:  EntrepreneurPulse.com.au  Fact Sheet for Healthcare Providers:  IncredibleEmployment.be  This test is no t yet approved or cleared by the [place] FDA and  has been authorized for detection and/or diagnosis of SARS-CoV-2 by FDA under an Emergency Use Authorization (EUA). This EUA will remain  in effect (meaning this test can be used) for the duration of the COVID-19 declaration under Section 564(b)(1) of the Act, 21 U.S.C.section 360bbb-3(b)(1), unless the authorization is terminated  or revoked sooner.       Influenza A  by PCR NEGATIVE NEGATIVE Final   Influenza B by PCR NEGATIVE NEGATIVE Final    Comment: (NOTE) The Xpert Xpress SARS-CoV-2/FLU/RSV plus assay is intended as an aid in the diagnosis of influenza from Nasopharyngeal swab specimens and should not be used as a sole basis for treatment. Nasal washings and aspirates are unacceptable for Xpert Xpress SARS-CoV-2/FLU/RSV testing.  Fact Sheet for Patients: EntrepreneurPulse.com.au  Fact Sheet for Healthcare Providers: IncredibleEmployment.be  This test is not yet approved or cleared by the [place] FDA and has been authorized for detection and/or diagnosis of SARS-CoV-2 by FDA under an Emergency Use Authorization (EUA). This EUA will remain in effect (meaning this test can be used) for the duration of the COVID-19 declaration under Section 564(b)(1) of the Act, 21 U.S.C. section 360bbb-3(b)(1), unless the authorization is terminated or revoked.  Performed at [facility], [place] [address] [place], [place] [address]   Culture, blood (x 2)     Status: None (Preliminary result)   Collection Time: 01/20/21  4:22 PM   Specimen: BLOOD  Result Value Ref Range Status   Specimen Description BLOOD RIGHT ANTECUBITAL  Final   Special Requests   Final    BOTTLES DRAWN AEROBIC ONLY Blood Culture adequate volume   Culture   Final    NO GROWTH < 24 HOURS Performed at [facility], [place] [address] [place], [place] [address]    Report Status PENDING  Incomplete  Culture, blood (x 2)     Status: None (Preliminary result)   Collection Time: 01/20/21  4:28 PM   Specimen: BLOOD  Result Value Ref Range Status   Specimen Description BLOOD LEFT ANTECUBITAL  Final   Special Requests   Final    BOTTLES DRAWN AEROBIC AND ANAEROBIC Blood Culture adequate volume   Culture   Final    NO GROWTH < 24 HOURS Performed at [facility], [place] [address], [place], [place] [address]    Report Status PENDING   Incomplete         Radiology Studies: DG Chest 2 View  Result Date: 01/20/2021 CLINICAL DATA:  40 year old male with chest pain shortness of breath and hemoptysis for 3 days. EXAM: CHEST - 2 VIEW COMPARISON:  None. FINDINGS: Chest radiographs 12/21/2020 and earlier.Chronic severe cardiomegaly. Stable lung volumes and mediastinal contours. Visualized tracheal air  column is within normal limits. Widespread and confluent but indistinct peribronchial and lower lung opacity now on the right. No superimposed pneumothorax. Left lung ventilation appears stable with mild atelectasis. No acute osseous abnormality identified. Negative visible bowel gas pattern. IMPRESSION: Progressed and now widespread right perihilar and lower lung indistinct pulmonary opacity. Chronic severe cardiomegaly. In this setting top differential considerations include multilobar right pneumonia, pronounced asymmetric pulmonary edema, pulmonary vasculitis. No pleural effusion. Electronically Signed   By: Genevie Ann M.D.   On: 01/20/2021 07:39   ECHOCARDIOGRAM COMPLETE  Result Date: 01/20/2021    ECHOCARDIOGRAM REPORT   Patient Name:   SHAQUEL HOCHSTETLER [name] Date of Exam: 01/20/2021 Medical Rec #:  [id number]        Height:       71.0 in Accession #:    [id number]       Weight:       281.6 lb Date of Birth:  1981-06-11        BSA:          2.439 m Patient Age:    62 years         BP:           150/108 mmHg Patient Gender: M                HR:           95 bpm. Exam Location:  Inpatient Procedure: 2D Echo, Cardiac Doppler and Color Doppler Indications:    CHF-Acute Diastolic [id number] / [id number]  History:        Patient has prior history of Echocardiogram examinations, most                 recent 07/05/2020. Previous Myocardial Infarction,                 Arrythmias:Atrial Fibrillation and Atrial Flutter; Risk                 Factors:Hypertension. Polysubstance abuse. Arrhythmia. Class 2                 obesity. Acute systolic congestive heart failure  ([place]).  Sonographer:    Alvino Chapel RCS Referring Phys: [place]  1. Left ventricular ejection fraction, by estimation, is 30 to 35%. The left ventricle has moderately decreased function. The left ventricle demonstrates global hypokinesis. The left ventricular internal cavity size was mildly dilated. There is severe left ventricular hypertrophy. Left ventricular diastolic parameters are indeterminate.  2. Right ventricular systolic function is normal. The right ventricular size is normal. Moderately increased right ventricular wall thickness. Tricuspid regurgitation signal is inadequate for assessing PA pressure.  3. Left atrial size was severely dilated.  4. Right atrial size was mildly dilated.  5. The mitral valve is normal in structure. Trivial mitral valve regurgitation.  6. The aortic valve is tricuspid. Aortic valve regurgitation is trivial. No aortic stenosis is present.  7. The inferior vena cava is dilated in size with >50% respiratory variability, suggesting right atrial pressure of 8 mmHg. FINDINGS  Left Ventricle: Left ventricular ejection fraction, by estimation, is 30 to 35%. The left ventricle has moderately decreased function. The left ventricle demonstrates global hypokinesis. The left ventricular internal cavity size was mildly dilated. There is severe left ventricular hypertrophy. Left ventricular diastolic parameters are indeterminate. Right Ventricle: The right ventricular size is normal. Moderately increased right ventricular wall thickness. Right ventricular systolic function is normal. Tricuspid regurgitation signal is inadequate for assessing PA  pressure. Left Atrium: Left atrial size was severely dilated. Right Atrium: Right atrial size was mildly dilated. Pericardium: There is no evidence of pericardial effusion. Mitral Valve: The mitral valve is normal in structure. Trivial mitral valve regurgitation. Tricuspid Valve: The tricuspid valve is normal in structure.  Tricuspid valve regurgitation is trivial. Aortic Valve: The aortic valve is tricuspid. Aortic valve regurgitation is trivial. No aortic stenosis is present. Pulmonic Valve: The pulmonic valve was not well visualized. Pulmonic valve regurgitation is trivial. Aorta: The aortic root is normal in size and structure. Venous: The inferior vena cava is dilated in size with greater than 50% respiratory variability, suggesting right atrial pressure of 8 mmHg. IAS/Shunts: The interatrial septum was not well visualized.  LEFT VENTRICLE PLAX 2D LVIDd:         6.20 cm LVIDs:         5.20 cm LV PW:         2.30 cm LV IVS:        2.20 cm LVOT diam:     2.30 cm LV SV:         72 LV SV Index:   29 LVOT Area:     4.15 cm  RIGHT VENTRICLE TAPSE (M-mode): 2.6 cm LEFT ATRIUM              Index        RIGHT ATRIUM           Index LA diam:        5.90 cm  2.42 cm/m   RA Area:     25.50 cm LA Vol (A2C):   285.0 ml 116.87 ml/m RA Volume:   92.20 ml  37.81 ml/m LA Vol (A4C):   195.0 ml 79.96 ml/m LA Biplane Vol: 236.0 ml 96.78 ml/m  AORTIC VALVE LVOT Vmax:   113.33 cm/s LVOT Vmean:  81.167 cm/s LVOT VTI:    0.172 m  AORTA Ao Root diam: 3.90 cm MITRAL VALVE MV Area (PHT): 5.66 cm     SHUNTS MV Decel Time: 134 msec     Systemic VTI:  0.17 m MV E velocity: 104.00 cm/s  Systemic Diam: 2.30 cm Oswaldo Milian MD Electronically signed by Oswaldo Milian MD Signature Date/Time: 01/20/2021/5:20:01 PM    Final         Scheduled Meds: . aspirin EC  81 mg Oral Daily  . atorvastatin  40 mg Oral Daily  . carvedilol  25 mg Oral BID WC  . docusate sodium  100 mg Oral BID  . enoxaparin (LOVENOX) injection  40 mg Subcutaneous Q24H  . folic acid  1 mg Oral Daily  . furosemide  40 mg Intravenous BID  . hydrALAZINE  100 mg Oral BID  . LORazepam  0-4 mg Intravenous Q6H   Followed by  . [START ON 01/22/2021] LORazepam  0-4 mg Intravenous Q12H  . multivitamin with minerals  1 tablet Oral Daily  . nicotine  14 mg Transdermal Daily   . potassium chloride  60 mEq Oral BID  . sacubitril-valsartan  1 tablet Oral BID  . sodium chloride flush  3 mL Intravenous Q12H  . spironolactone  25 mg Oral Daily  . thiamine  100 mg Oral Daily   Or  . thiamine  100 mg Intravenous Daily   Continuous Infusions: . azithromycin    . cefTRIAXone (ROCEPHIN)  IV    . potassium chloride       LOS: 1 day  Hosie Poisson, MD [facility]   To contact the attending provider between 7A-7P or the covering provider during after hours 7P-7A, please log into the web site [email or web address] and access using universal [place] password for that web site. If you do not have the password, please call the hospital operator.  01/21/2021, 8:27 AM

## 2021-01-21 NOTE — Progress Notes (Addendum)
Progress Note  Patient Name: Gerald [name] Date of Encounter: 01/21/2021  [facility] Cardiologist: Ida Rogue, MD   Subjective   Feeling better. Breathing has improved significantly. Still with some coughing and associated CP but no CP with activity or at rest. We discussed medication needs going forward. He is current uninsured but hoping to get insurance soon. He reports he got paid today so will be able to afford medications at discharge. Will need to work with SW/CM for medication support  Inpatient Medications    Scheduled Meds: . aspirin EC  81 mg Oral Daily  . atorvastatin  40 mg Oral Daily  . carvedilol  25 mg Oral BID WC  . docusate sodium  100 mg Oral BID  . enoxaparin (LOVENOX) injection  40 mg Subcutaneous Q24H  . folic acid  1 mg Oral Daily  . furosemide  40 mg Intravenous BID  . hydrALAZINE  100 mg Oral BID  . LORazepam  0-4 mg Intravenous Q6H   Followed by  . [START ON 01/22/2021] LORazepam  0-4 mg Intravenous Q12H  . multivitamin with minerals  1 tablet Oral Daily  . nicotine  14 mg Transdermal Daily  . sacubitril-valsartan  1 tablet Oral BID  . sodium chloride flush  3 mL Intravenous Q12H  . spironolactone  25 mg Oral Daily  . thiamine  100 mg Oral Daily   Or  . thiamine  100 mg Intravenous Daily   Continuous Infusions: . azithromycin    . cefTRIAXone (ROCEPHIN)  IV     PRN Meds: acetaminophen **OR** acetaminophen, bisacodyl, hydrALAZINE, LORazepam **OR** LORazepam, ondansetron **OR** ondansetron (ZOFRAN) IV, polyethylene glycol   Vital Signs    Vitals:   01/20/21 1718 01/20/21 2232 01/21/21 0004 01/21/21 0509  BP: 129/81 121/74 (!) 155/98 (!) 152/96  Pulse: 92 84 100 97  Resp:  '18 16 16  '$ Temp:  98.3 F (36.8 C) 98.6 F (37 C) 98.5 F (36.9 C)  TempSrc:  Oral Oral Oral  SpO2: 94% 95% 97% 100%  Weight:    118.3 kg    Intake/Output Summary (Last 24 hours) at 01/21/2021 0814 Last data filed at 01/21/2021 0400 Gross per 24 hour  Intake  651.23 ml  Output 2850 ml  Net -2198.77 ml   Last 3 Weights 01/21/2021 12/24/2020 12/23/2020  Weight (lbs) 260 lb 12.4 oz 281 lb 9.6 oz 282 lb 8 oz  Weight (kg) 118.286 kg 127.733 kg 128.141 kg      Telemetry    Sinus rhythm with chronic LBBB and occasional brief runs of NSVT - Personally Reviewed  ECG    No new tracing - Personally Reviewed  Physical Exam   GEN: No acute distress.   Neck: No JVD Cardiac: RRR, no murmurs, rubs, or gallops.  Respiratory: Clear to auscultation bilaterally. GI: Soft, nontender, non-distended  MS: trace LE edema; No deformity. Neuro:  Nonfocal  Psych: Normal affect   Labs    High Sensitivity Troponin:   Recent Labs  Lab 01/20/21 0708 01/20/21 0848  TROPONINIHS 628* 611*      Chemistry Recent Labs  Lab 01/20/21 0708 01/21/21 0456  NA 136 137  K 2.7* 2.8*  CL 105 105  CO2 22 22  GLUCOSE 107* 111*  BUN 22* 23*  CREATININE 1.92* 2.02*  CALCIUM 9.0 8.3*  GFRNONAA 45* 42*  ANIONGAP 9 10     Hematology Recent Labs  Lab 01/20/21 0708 01/21/21 0456  WBC 8.4 8.6  RBC 5.39 4.72  HGB 11.4* 9.9*  HCT 37.1* 32.6*  MCV 68.8* 69.1*  MCH 21.2* 21.0*  MCHC 30.7 30.4  RDW 18.3* 17.9*  PLT 208 200    BNP Recent Labs  Lab 01/20/21 0837  BNP 2,466.4*     DDimer No results for input(s): DDIMER in the last 168 hours.   Radiology    DG Chest 2 View  Result Date: 01/20/2021 CLINICAL DATA:  40 year old male with chest pain shortness of breath and hemoptysis for 3 days. EXAM: CHEST - 2 VIEW COMPARISON:  None. FINDINGS: Chest radiographs 12/21/2020 and earlier.Chronic severe cardiomegaly. Stable lung volumes and mediastinal contours. Visualized tracheal air column is within normal limits. Widespread and confluent but indistinct peribronchial and lower lung opacity now on the right. No superimposed pneumothorax. Left lung ventilation appears stable with mild atelectasis. No acute osseous abnormality identified. Negative visible bowel gas  pattern. IMPRESSION: Progressed and now widespread right perihilar and lower lung indistinct pulmonary opacity. Chronic severe cardiomegaly. In this setting top differential considerations include multilobar right pneumonia, pronounced asymmetric pulmonary edema, pulmonary vasculitis. No pleural effusion. Electronically Signed   By: Genevie Ann M.D.   On: 01/20/2021 07:39   ECHOCARDIOGRAM COMPLETE  Result Date: 01/20/2021    ECHOCARDIOGRAM REPORT   Patient Name:   Gerald [name] Date of Exam: 01/20/2021 Medical Rec #:  [id number]        Height:       71.0 in Accession #:    [id number]       Weight:       281.6 lb Date of Birth:  [birth year]        BSA:          2.439 m Patient Age:    40 years         BP:           150/108 mmHg Patient Gender: M                HR:           95 bpm. Exam Location:  Inpatient Procedure: 2D Echo, Cardiac Doppler and Color Doppler Indications:    CHF-Acute Diastolic [id number] / [id number]  History:        Patient has prior history of Echocardiogram examinations, most                 recent 07/05/2020. Previous Myocardial Infarction,                 Arrythmias:Atrial Fibrillation and Atrial Flutter; Risk                 Factors:Hypertension. Polysubstance abuse. Arrhythmia. Class 2                 obesity. Acute systolic congestive heart failure ([place]).  Sonographer:    Alvino Chapel RCS Referring Phys: Erick  1. Left ventricular ejection fraction, by estimation, is 30 to 35%. The left ventricle has moderately decreased function. The left ventricle demonstrates global hypokinesis. The left ventricular internal cavity size was mildly dilated. There is severe left ventricular hypertrophy. Left ventricular diastolic parameters are indeterminate.  2. Right ventricular systolic function is normal. The right ventricular size is normal. Moderately increased right ventricular wall thickness. Tricuspid regurgitation signal is inadequate for assessing PA pressure.  3. Left atrial  size was severely dilated.  4. Right atrial size was mildly dilated.  5. The mitral valve is normal in structure. Trivial mitral valve regurgitation.  6. The  aortic valve is tricuspid. Aortic valve regurgitation is trivial. No aortic stenosis is present.  7. The inferior vena cava is dilated in size with >50% respiratory variability, suggesting right atrial pressure of 8 mmHg. FINDINGS  Left Ventricle: Left ventricular ejection fraction, by estimation, is 30 to 35%. The left ventricle has moderately decreased function. The left ventricle demonstrates global hypokinesis. The left ventricular internal cavity size was mildly dilated. There is severe left ventricular hypertrophy. Left ventricular diastolic parameters are indeterminate. Right Ventricle: The right ventricular size is normal. Moderately increased right ventricular wall thickness. Right ventricular systolic function is normal. Tricuspid regurgitation signal is inadequate for assessing PA pressure. Left Atrium: Left atrial size was severely dilated. Right Atrium: Right atrial size was mildly dilated. Pericardium: There is no evidence of pericardial effusion. Mitral Valve: The mitral valve is normal in structure. Trivial mitral valve regurgitation. Tricuspid Valve: The tricuspid valve is normal in structure. Tricuspid valve regurgitation is trivial. Aortic Valve: The aortic valve is tricuspid. Aortic valve regurgitation is trivial. No aortic stenosis is present. Pulmonic Valve: The pulmonic valve was not well visualized. Pulmonic valve regurgitation is trivial. Aorta: The aortic root is normal in size and structure. Venous: The inferior vena cava is dilated in size with greater than 50% respiratory variability, suggesting right atrial pressure of 8 mmHg. IAS/Shunts: The interatrial septum was not well visualized.  LEFT VENTRICLE PLAX 2D LVIDd:         6.20 cm LVIDs:         5.20 cm LV PW:         2.30 cm LV IVS:        2.20 cm LVOT diam:     2.30 cm LV SV:          72 LV SV Index:   29 LVOT Area:     4.15 cm  RIGHT VENTRICLE TAPSE (M-mode): 2.6 cm LEFT ATRIUM              Index        RIGHT ATRIUM           Index LA diam:        5.90 cm  2.42 cm/m   RA Area:     25.50 cm LA Vol (A2C):   285.0 ml 116.87 ml/m RA Volume:   92.20 ml  37.81 ml/m LA Vol (A4C):   195.0 ml 79.96 ml/m LA Biplane Vol: 236.0 ml 96.78 ml/m  AORTIC VALVE LVOT Vmax:   113.33 cm/s LVOT Vmean:  81.167 cm/s LVOT VTI:    0.172 m  AORTA Ao Root diam: 3.90 cm MITRAL VALVE MV Area (PHT): 5.66 cm     SHUNTS MV Decel Time: 134 msec     Systemic VTI:  0.17 m MV E velocity: 104.00 cm/s  Systemic Diam: 2.30 cm Oswaldo Milian MD Electronically signed by Oswaldo Milian MD Signature Date/Time: 01/20/2021/5:20:01 PM    Final     Cardiac Studies   Echocardiogram: 06/2020 IMPRESSIONS    1. Left ventricular ejection fraction, by estimation, is 30 to 35%. The  left ventricle has moderately decreased function. The left ventricle  demonstrates global hypokinesis. The left ventricular internal cavity size  was mildly dilated. There is severe  left ventricular hypertrophy. Left ventricular diastolic parameters are  consistent with [name] diastolic dysfunction (pseudonormalization).  Elevated left atrial pressure.  2. Right ventricular systolic function is normal. The right ventricular  size is normal. Tricuspid regurgitation signal is inadequate for assessing  PA pressure.  3. Left atrial size was severely dilated.  4. The mitral valve is normal in structure. Trivial mitral valve  regurgitation.  5. The aortic valve is tricuspid. Aortic valve regurgitation is trivial.  No aortic stenosis is present.  6. Aortic dilatation noted. There is mild dilatation of the aortic root,  measuring 40 mm. There is mild dilatation of the ascending aorta,  measuring 39 mm.  7. The inferior vena cava is dilated in size with >50% respiratory  variability, suggesting right atrial pressure of  8 mmHg.   LHC: [id number]   LV end diastolic pressure is moderately elevated.  1. Very large and normal coronary arteries 2. Moderately elevated LVEDP 24 mm Hg  Plan: focus on BP control and optimization of CHF therapy.  Patient Profile     40 y.o. 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 Acute on chronic combined CHF/NICM: patient presented with chest pain and SOB. Again non-compliant with medication due to cost. BP markedly elevated. BNP elevated to 2466. HsTrop [id number].  CXR indistinct lower lung opacities and severe cardiomegaly. He was restarted on carvedilol, spironolactone, and hydralazine, in addition to entresto which is the only medication he was compliant with. He is diuresing with IV lasix '40mg'$  BID with UOP net -2.1L this admission. No weight trend yet. Cr stable at 2.0 from 1.9 yesterday. Echo showed EF 30-35%, global hypokinesis, indeterminate LV diastolic function, severe LVH and moderate RVH, severe LAE, mild RAE, and no significant valvular abnormalities  - Continue IV lasix - suspect he can transition to po tomorrow.  - Continue carvedilol, entresto, hydralazine, and spironolactone  2. HTN urgency: BP markedly elevated on arrival. Improved with restarting previously prescribed medications as above.  - Managed in the context of #1.  - Consider uptitration of hydralazine to '100mg'$  TID if BP does not improve today  3. Elevated troponin: trop trend 628>611 - low flat trend not c/w ACS. CP only occurs with coughing/deep breathing. Likely demand ischemia in the setting of #1 and 2. Echo stable with EF 30-35% and no RWMA. Had cath 12/2020 which showed normal coronary arteries. - No further cardiac work-up at this time  4. Paroxysmal atrial fibrillation: maintaining sinus rhythm. Not  compliant with xarelto.  - Continue carvedilol for rate control - Will hold off on anticoagulation due to non-compliance concerns  5. Hypokalemia: continues to have significantly low K 2.7 on admission and 2.8 today - Will give IV potassium 10 mEq x3 and potassium po 60 mEq x2 today. - Continue to monitor closely and replete as needed to maintain K >4  6. NSVT: still with brief runs on telemetry, likely due to electrolyte imbalance.  - Continue potassium repletion as above - Continue to monitor closely and replete as needed to maintain K >4 and Mg >2  7. CKD stage 3a: Cr stable at 2.0 today - Continue to monitor closely with ongoing diuresis.   8. Substance abuse: continues to smoke and use cocaine - Continue to encourage cessation  9. PNA: febrile yesterday afternoon. On IV antibiotics for possible superimposed PNA. Patient reports improvement in breathing/coughing - Continue management per primary team.         For questions or updates, please contact [place] Please consult [email or web address] for contact info under        Signed, Daleen Snook  Otilio Connors, PA-C  01/21/2021, 8:14 AM     Attending Note:   The patient was seen and examined.  Agree with assessment and plan as noted above.  Changes made to the above note as needed.  Patient seen and independently examined with Roby Lofts, PA .   We discussed all aspects of the encounter. I agree with the assessment and plan as stated above.  Acute on chronic combined congestive heart failure: Patient has been noncompliant with medications.  He is not been able to afford his medicines.  He is diuresed nicely.  Creatinine seems to be stable.  HTN:   BP is better.   Cont meds.    3.  PAF :  In NSR     I have spent a total of 40 minutes with patient reviewing hospital  notes , telemetry, EKGs, labs and examining patient as well as establishing an assessment and plan that was discussed with the patient. > 50% of time was spent  in direct patient care.  He should be able to go home soon .     Thayer Headings, Brooke Bonito., MD, [facility] 01/21/2021, 10:43 AM [address]  [place] Pager [phone]

## 2021-01-21 NOTE — Plan of Care (Signed)
Nutrition Education Note  RD consulted for nutrition education regarding uncontrolled HTN and CHF.  RD provided "Low Sodium Nutrition Therapy" handout from the [employer]. Reviewed patient's dietary recall. Provided examples on ways to decrease sodium intake in diet. Discouraged intake of processed foods and use of salt shaker. Encouraged fresh fruits and vegetables as well as whole grain sources of carbohydrates to maximize fiber intake. Pt already able to tell RD what he is planning to change and was able to recite the main points to RD before RD went over handout and material.  RD discussed why it is important for patient to adhere to diet recommendations, and emphasized the role of fluids, foods to avoid, and importance of weighing self daily. Teach back method used.  Expect good compliance.  Body mass index is 36.37 kg/m. Pt meets criteria for Obesity Class 2 based on current BMI.  Current diet order is Heart Healthy, patient is consuming approximately 100% of meals at this time. Labs and medications reviewed. No further nutrition interventions warranted at this time.   RD contact information provided. If additional nutrition issues arise, please re-consult RD.   Derrel Nip, RD, LDN Registered Dietitian I After-Hours/Weekend Pager # in [place]

## 2021-01-22 MED ORDER — HYDRALAZINE HCL 50 MG PO TABS
100.0000 mg | ORAL_TABLET | Freq: Three times a day (TID) | ORAL | Status: DC
  Administered 2021-01-22 – 2021-01-23 (×4): 100 mg via ORAL
  Filled 2021-01-22 (×4): qty 2

## 2021-01-22 MED ORDER — ISOSORBIDE MONONITRATE ER 30 MG PO TB24
30.0000 mg | ORAL_TABLET | Freq: Every day | ORAL | Status: DC
  Administered 2021-01-22 – 2021-01-23 (×2): 30 mg via ORAL
  Filled 2021-01-22 (×2): qty 1

## 2021-01-22 MED ORDER — POTASSIUM CHLORIDE CRYS ER 20 MEQ PO TBCR
60.0000 meq | EXTENDED_RELEASE_TABLET | Freq: Once | ORAL | Status: AC
  Administered 2021-01-22: 60 meq via ORAL
  Filled 2021-01-22: qty 3

## 2021-01-22 MED ORDER — POTASSIUM CHLORIDE CRYS ER 10 MEQ PO TBCR
10.0000 meq | EXTENDED_RELEASE_TABLET | Freq: Two times a day (BID) | ORAL | Status: DC
  Administered 2021-01-23: 10 meq via ORAL
  Filled 2021-01-22: qty 1

## 2021-01-22 MED ORDER — FUROSEMIDE 40 MG PO TABS
40.0000 mg | ORAL_TABLET | Freq: Two times a day (BID) | ORAL | Status: DC
  Administered 2021-01-22 – 2021-01-23 (×2): 40 mg via ORAL
  Filled 2021-01-22 (×2): qty 1

## 2021-01-22 MED ORDER — POTASSIUM CHLORIDE CRYS ER 20 MEQ PO TBCR
40.0000 meq | EXTENDED_RELEASE_TABLET | Freq: Once | ORAL | Status: AC
  Administered 2021-01-22: 40 meq via ORAL
  Filled 2021-01-22: qty 2

## 2021-01-22 NOTE — Progress Notes (Addendum)
Progress Note  Patient Name: Gerald [name] Date of Encounter: 01/22/2021  [facility] Cardiologist: Ida Rogue, MD   Subjective   Reports breathing is almost back to baseline. He continues to have some coughing. No complaints of chest pain or palpitations  Inpatient Medications    Scheduled Meds: . aspirin EC  81 mg Oral Daily  . atorvastatin  40 mg Oral Daily  . azithromycin  500 mg Oral Daily  . carvedilol  25 mg Oral BID WC  . docusate sodium  100 mg Oral BID  . folic acid  1 mg Oral Daily  . furosemide  40 mg Intravenous BID  . hydrALAZINE  100 mg Oral BID  . LORazepam  0-4 mg Intravenous Q6H   Followed by  . LORazepam  0-4 mg Intravenous Q12H  . multivitamin with minerals  1 tablet Oral Daily  . nicotine  14 mg Transdermal Daily  . rivaroxaban  20 mg Oral Q supper  . sacubitril-valsartan  1 tablet Oral BID  . sodium chloride flush  3 mL Intravenous Q12H  . spironolactone  25 mg Oral Daily  . thiamine  100 mg Oral Daily   Continuous Infusions: . cefTRIAXone (ROCEPHIN)  IV Stopped (01/21/21 1203)   PRN Meds: acetaminophen **OR** acetaminophen, benzonatate, bisacodyl, guaiFENesin-dextromethorphan, hydrALAZINE, LORazepam **OR** LORazepam, ondansetron **OR** ondansetron (ZOFRAN) IV, polyethylene glycol   Vital Signs    Vitals:   01/22/21 0522 01/22/21 0523 01/22/21 0525 01/22/21 0527  BP: (!) 164/108 (!) 164/115 (!) 149/103 (!) 146/105  Pulse:      Resp:      Temp:      TempSrc:      SpO2: 96% 97% 100% 100%  Weight:        Intake/Output Summary (Last 24 hours) at 01/22/2021 0746 Last data filed at 01/22/2021 0053 Gross per 24 hour  Intake 1002.11 ml  Output 1600 ml  Net -597.89 ml   Last 3 Weights 01/22/2021 01/21/2021 12/24/2020  Weight (lbs) 273 lb 8 oz 260 lb 12.4 oz 281 lb 9.6 oz  Weight (kg) 124.059 kg 118.286 kg 127.733 kg      Telemetry    Sinus rhythm with occasional PVCs and brief runs of NSVT (tachycardia overnight c/w artifact) -  Personally Reviewed  ECG    No new tracings - Personally Reviewed  Physical Exam   GEN: No acute distress.   Neck: No JVD Cardiac: RRR, no murmurs, rubs, or gallops.  Respiratory: Clear to auscultation bilaterally. GI: Soft, nontender, non-distended  MS: No edema; No deformity. Neuro:  Nonfocal  Psych: Normal affect   Labs    High Sensitivity Troponin:   Recent Labs  Lab 01/20/21 0708 01/20/21 0848  TROPONINIHS 628* 611*      Chemistry Recent Labs  Lab 01/20/21 0708 01/21/21 0456 01/21/21 1610  NA 136 137 139  K 2.7* 2.8* 3.4*  CL 105 105 106  CO2 '22 22 25  '$ GLUCOSE 107* 111* 99  BUN 22* 23* 25*  CREATININE 1.92* 2.02* 2.10*  CALCIUM 9.0 8.3* 8.5*  GFRNONAA 45* 42* 40*  ANIONGAP '9 10 8     '$ Hematology Recent Labs  Lab 01/20/21 0708 01/21/21 0456  WBC 8.4 8.6  RBC 5.39 4.72  HGB 11.4* 9.9*  HCT 37.1* 32.6*  MCV 68.8* 69.1*  MCH 21.2* 21.0*  MCHC 30.7 30.4  RDW 18.3* 17.9*  PLT 208 200    BNP Recent Labs  Lab 01/20/21 0837  BNP 2,466.4*  DDimer No results for input(s): DDIMER in the last 168 hours.   Radiology    ECHOCARDIOGRAM COMPLETE  Result Date: 01/20/2021    ECHOCARDIOGRAM REPORT   Patient Name:   Gerald [name] Date of Exam: 01/20/2021 Medical Rec #:  [id number]        Height:       71.0 in Accession #:    [id number]       Weight:       281.6 lb Date of Birth:  November 05, 1980        BSA:          2.439 m Patient Age:    39 years         BP:           150/108 mmHg Patient Gender: M                HR:           95 bpm. Exam Location:  Inpatient Procedure: 2D Echo, Cardiac Doppler and Color Doppler Indications:    CHF-Acute Diastolic [id number] / [id number]  History:        Patient has prior history of Echocardiogram examinations, most                 recent 07/05/2020. Previous Myocardial Infarction,                 Arrythmias:Atrial Fibrillation and Atrial Flutter; Risk                 Factors:Hypertension. Polysubstance abuse. Arrhythmia. Class 2                  obesity. Acute systolic congestive heart failure ([place]).  Sonographer:    Alvino Chapel RCS Referring Phys: [place]  1. Left ventricular ejection fraction, by estimation, is 30 to 35%. The left ventricle has moderately decreased function. The left ventricle demonstrates global hypokinesis. The left ventricular internal cavity size was mildly dilated. There is severe left ventricular hypertrophy. Left ventricular diastolic parameters are indeterminate.  2. Right ventricular systolic function is normal. The right ventricular size is normal. Moderately increased right ventricular wall thickness. Tricuspid regurgitation signal is inadequate for assessing PA pressure.  3. Left atrial size was severely dilated.  4. Right atrial size was mildly dilated.  5. The mitral valve is normal in structure. Trivial mitral valve regurgitation.  6. The aortic valve is tricuspid. Aortic valve regurgitation is trivial. No aortic stenosis is present.  7. The inferior vena cava is dilated in size with >50% respiratory variability, suggesting right atrial pressure of 8 mmHg. FINDINGS  Left Ventricle: Left ventricular ejection fraction, by estimation, is 30 to 35%. The left ventricle has moderately decreased function. The left ventricle demonstrates global hypokinesis. The left ventricular internal cavity size was mildly dilated. There is severe left ventricular hypertrophy. Left ventricular diastolic parameters are indeterminate. Right Ventricle: The right ventricular size is normal. Moderately increased right ventricular wall thickness. Right ventricular systolic function is normal. Tricuspid regurgitation signal is inadequate for assessing PA pressure. Left Atrium: Left atrial size was severely dilated. Right Atrium: Right atrial size was mildly dilated. Pericardium: There is no evidence of pericardial effusion. Mitral Valve: The mitral valve is normal in structure. Trivial mitral valve regurgitation.  Tricuspid Valve: The tricuspid valve is normal in structure. Tricuspid valve regurgitation is trivial. Aortic Valve: The aortic valve is tricuspid. Aortic valve regurgitation is trivial. No aortic stenosis is present. Pulmonic Valve: The pulmonic  valve was not well visualized. Pulmonic valve regurgitation is trivial. Aorta: The aortic root is normal in size and structure. Venous: The inferior vena cava is dilated in size with greater than 50% respiratory variability, suggesting right atrial pressure of 8 mmHg. IAS/Shunts: The interatrial septum was not well visualized.  LEFT VENTRICLE PLAX 2D LVIDd:         6.20 cm LVIDs:         5.20 cm LV PW:         2.30 cm LV IVS:        2.20 cm LVOT diam:     2.30 cm LV SV:         72 LV SV Index:   29 LVOT Area:     4.15 cm  RIGHT VENTRICLE TAPSE (M-mode): 2.6 cm LEFT ATRIUM              Index        RIGHT ATRIUM           Index LA diam:        5.90 cm  2.42 cm/m   RA Area:     25.50 cm LA Vol (A2C):   285.0 ml 116.87 ml/m RA Volume:   92.20 ml  37.81 ml/m LA Vol (A4C):   195.0 ml 79.96 ml/m LA Biplane Vol: 236.0 ml 96.78 ml/m  AORTIC VALVE LVOT Vmax:   113.33 cm/s LVOT Vmean:  81.167 cm/s LVOT VTI:    0.172 m  AORTA Ao Root diam: 3.90 cm MITRAL VALVE MV Area (PHT): 5.66 cm     SHUNTS MV Decel Time: 134 msec     Systemic VTI:  0.17 m MV E velocity: 104.00 cm/s  Systemic Diam: 2.30 cm Oswaldo Milian MD Electronically signed by Oswaldo Milian MD Signature Date/Time: 01/20/2021/5:20:01 PM    Final     Cardiac Studies   Echocardiogram: 06/2020 IMPRESSIONS    1. Left ventricular ejection fraction, by estimation, is 30 to 35%. The  left ventricle has moderately decreased function. The left ventricle  demonstrates global hypokinesis. The left ventricular internal cavity size  was mildly dilated. There is severe  left ventricular hypertrophy. Left ventricular diastolic parameters are  consistent with [name] diastolic dysfunction  (pseudonormalization).  Elevated left atrial pressure.  2. Right ventricular systolic function is normal. The right ventricular  size is normal. Tricuspid regurgitation signal is inadequate for assessing  PA pressure.  3. Left atrial size was severely dilated.  4. The mitral valve is normal in structure. Trivial mitral valve  regurgitation.  5. The aortic valve is tricuspid. Aortic valve regurgitation is trivial.  No aortic stenosis is present.  6. Aortic dilatation noted. There is mild dilatation of the aortic root,  measuring 40 mm. There is mild dilatation of the ascending aorta,  measuring 39 mm.  7. The inferior vena cava is dilated in size with >50% respiratory  variability, suggesting right atrial pressure of 8 mmHg.   LHC: [id number]   LV end diastolic pressure is moderately elevated.  1. Very large and normal coronary arteries 2. Moderately elevated LVEDP 24 mm Hg  Plan: focus on BP control and optimization of CHF therapy.  Echocardiogram 01/20/21: 1. Left ventricular ejection fraction, by estimation, is 30 to 35%. The  left ventricle has moderately decreased function. The left ventricle  demonstrates global hypokinesis. The left ventricular internal cavity size  was mildly dilated. There is severe  left ventricular hypertrophy. Left ventricular diastolic parameters are  indeterminate.  2. Right ventricular systolic function is normal. The right ventricular  size is normal. Moderately increased right ventricular wall thickness.  Tricuspid regurgitation signal is inadequate for assessing PA pressure.  3. Left atrial size was severely dilated.  4. Right atrial size was mildly dilated.  5. The mitral valve is normal in structure. Trivial mitral valve  regurgitation.  6. The aortic valve is tricuspid. Aortic valve regurgitation is trivial.  No aortic stenosis is present.  7. The inferior vena cava is dilated in size with >50% respiratory  variability,  suggesting right atrial pressure of 8 mmHg.   Patient Profile     40 y.o. 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 Acute on chronic combined CHF/NICM: patient presented with chest pain and SOB. Again non-compliant with medication due to cost. BP markedly elevated. BNP elevated to 2466. HsTrop [id number].  CXR indistinct lower lung opacities and severe cardiomegaly. He was restarted on carvedilol, spironolactone, and hydralazine, in addition to entresto which is the only medication he was compliant with. He is diuresing with IV lasix '40mg'$  BID with UOP net -636m in the past 24 hours and -2.7L this admission. ?accuracy of weight trend 260lbs>273lbs. Cr stable at 2.1 from 1.9 on admission. Echo showed EF 30-35%, global hypokinesis, indeterminate LV diastolic function, severe LVH and moderate RVH, severe LAE, mild RAE, and no significant valvular abnormalities. His cardiomyopathy is likely 2/2 poorly controlled HTN - Will transition to po lasix today - Continue carvedilol, entresto, hydralazine, and spironolactone - Continue to monitor strict I&Os and daily weights - [facility]   2. HTN urgency: BP markedly elevated on arrival. Improved with restarting previously prescribed medications as above.  - Managed in the context of #1.  - Will increase hydralazine to '100mg'$  TID for improved BP control - Will also add imdur '30mg'$  daily today  3. Elevated troponin: trop trend 628>611 - low flat trend not c/w ACS. CP only occurs with coughing/deep breathing. Likely demand ischemia in the setting of #1 and 2. Echo stable with EF 30-35% and no RWMA. Had cath 12/2020 which showed normal coronary arteries. - No further cardiac work-up at this time  4. Paroxysmal atrial  fibrillation: maintaining sinus rhythm. Not compliant with xarelto. HF pharmacist was able to get patient assistance approval through 07/20/21 for $0/month. Xarelto restarted 01/21/21 given CHA2DS2-VASc Score = 2 [CHF History: Yes, HTN History: Yes, Diabetes History: No, Stroke History: No, Vascular Disease History: No, Age Score: 0, Gender Score: 0].  Therefore, the patient's annual risk of stroke is 2.2 %.    - Continue carvedilol for rate control - Continue xarelto for stroke ppx  5. Hypokalemia: continues to have significantly low K 2.7 on admission>2.8>3.4 with aggressive repletion - Will give po 60 mEq this morning and 40 mEq this evening - Continue to monitor closely and replete as needed to maintain K >4  6. NSVT: still with brief runs on telemetry, likely due to electrolyte imbalance.  - Continue potassium repletion as above - Continue to monitor closely and replete as needed to maintain K >4 and Mg >2  7. CKD stage 3a: Cr 2.1 today - Continue to monitor closely with ongoing diuresis.   8. Substance abuse: continues to smoke and use cocaine - Continue to  encourage cessation  9. PNA: febrile yesterday afternoon. On IV antibiotics for possible superimposed PNA. Patient reports improvement in breathing/coughing - Continue management per primary team.        For questions or updates, please contact [place] Please consult [email or web address] for contact info under        Signed, Abigail Butts, PA-C  01/22/2021, 7:46 AM    Attending Note:   The patient was seen and examined.  Agree with assessment and plan as noted above.  Changes made to the above note as needed.  Patient seen and independently examined with  Roby Lofts, PA .   We discussed all aspects of the encounter. I agree with the assessment and plan as stated above.  1.   Acute on chronic combined CHF:   Cont medical therapy.    Has been somewhat noncompliant in the past.  Was using cocaine as of last  month  Continue current meds.  He is doing well. I suspect he could be discharged today or tomorrow   2.   PAF:  Cont xarelto   3.  + troponin : likely demand ischemia in the setting of CHF and marked HTN.  Cath last month showed normal cors.   4.  CKD:   Plans per primary team   5.  Hypokalemia:   I have added scheduled Kdur 10 meq BID along with his Lasix 40 mg BID. He is on spironolactone 25 a day ( as well as entresto) He will need a bmp with in a week  It appears that he will be followed in the advanced chf clinic   I have spent a total of 40 minutes with patient reviewing hospital  notes , telemetry, EKGs, labs and examining patient as well as establishing an assessment and plan that was discussed with the patient. > 50% of time was spent in direct patient care.     Thayer Headings, Brooke Bonito., MD, [facility] 01/22/2021, 10:55 AM [address]  [place] Pager [phone]

## 2021-01-22 NOTE — Progress Notes (Signed)
PROGRESS NOTE    Gerald [name]  [id number] DOB: 10/09/1980 DOA: 01/20/2021 PCP: Patient, [name] (Inactive)    Brief Narrative:  Gerald [name] is a 40 year old male with past medical history significant for paroxysmal atrial fibrillation on Xarelto, history of cocaine abuse, essential hypertension, chronic combined systolic/diastolic congestive heart failure, CKD stage IIIb, morbid obesity who presented to the ED with chest pain and hemoptysis.  Patient was noted to be hypoxic requiring 2 L of nasal cannula on arrival, febrile with chest x-ray notable for pneumonia and volume overload.  Cardiology was consulted and patient admitted to the hospital service for acute hypoxic respiratory failure secondary to combination of pneumonia and acute on chronic combined diastolic/systolic congestive heart failure exacerbation.   Assessment & Plan:   Principal Problem:   Hypertensive crisis Active Problems:   Acute on chronic combined systolic and diastolic CHF (congestive heart failure) ([name])   Essential hypertension   PAF (paroxysmal atrial fibrillation) ([name])   CKD (chronic kidney disease), stage III ([name])   Polysubstance abuse ([name])   Class 2 obesity due to excess calories with body mass index (BMI) of 39.0 to 39.9 in adult   Acute hypoxic respite failure, POA Patient presenting to ED with progressive shortness of breath and hemoptysis.  Patient not compliant with his home medications with recent cocaine abuse.  Chest x-ray concerning for volume overload and pneumonia.  Now titrated off of supplemental oxygen following treatment of pneumonia/CHF as below.  Continue treatment as below.  Community-acquired pneumonia Chest x-ray on arrival with progression of right perihilar and right lower lung opacity. --Azithromycin 500 mg p.o. daily --Ceftriaxone 2 g IV every 24 hours  Acute on chronic combined systolic/diastolic congestive heart failure Patient presenting with progressive  shortness of breath and hemoptysis, likely secondary to volume overload.  BNP elevated 2466.  Chest x-ray with findings consistent with pulmonary edema.  TTE with LVEF 30-35%, LV with global hypokinesis, LVH severe, LA severely dilated, RA mildly dilated, trivial MR, IVC dilated. --net negative 515m past 24 hours and net negative 2.8L since admission --Carvedilol 25 mg p.o. twice daily --Isosorbide mononitrate 30 mg p.o. daily --Entresto 24-26 mg p.o. twice daily --Spironolactone 25 mg p.o. daily -- Furosemide 40 mg p.o. twice daily --Has outpatient follow-up scheduled in the heart failure clinic on 01/29/2021  CKD stage IIIb Baseline creatinine 2.0 to 2.2. --Cr 1.92>2.02>2.10 --Avoid nephrotoxins, renally dose all medications --BMP in the a.m.  Essential hypertension BP 160/112 this morning, not optimally controlled --Carvedilol 25 mg p.o. twice daily --Isosorbide mononitrate 30 mg p.o. daily --Entresto 24-26 mg p.o. twice daily --Spironolactone 25 mg p.o. daily --Hydralazine 100 mg p.o. every 8 hours --Continue aspirin and statin  Paroxysmal atrial fibrillation --Carvedilol 25 mg p.o. twice daily --Xarelto --Continue monitor on telemetry  Anemia of chronic renal disease Hemoglobin stable  Hypokalemia Repleted. --Potassium chloride 10 mEq PO BID --repeat BMP and Mg in the am  Obesity Body mass index is 38.15 kg/m.  Outpatient follow-up with PCP for obesity and lifestyle modification as this complicates all facets of care.  Polysubstance abuse UDS positive for THC.  Endorses previous cocaine abuse.  Counseled on need for complete cessation. --Seen by TOC for substance abuse resources   DVT prophylaxis:  rivaroxaban (XARELTO) tablet 20 mg    Code Status: Full Code Family Communication: None present at bedside  Disposition Plan:  Level of care: Progressive Status is: Inpatient  Remains inpatient appropriate because:Ongoing diagnostic testing needed not appropriate  for outpatient work up, Unsafe d/c plan, IV treatments appropriate due to intensity of illness or inability to take PO and Inpatient level of care appropriate due to severity of illness   Dispo: The patient is from: Home              Anticipated d/c is to: Home              Patient currently is not medically stable to d/c.   Difficult to place patient No    Consultants:   Cardiology  Procedures:   TTE  Antimicrobials:   Azithromycin  Ceftriaxone   Subjective: Patient seen examined bedside, resting comfortably.  Shortness of breath much improved.  Blood pressure not at goal.  Titrated off of supple oxygen.  States will adhere to his medication regimen.  No other questions or concerns at this time.  Denies headache, no visual changes, no chest pain, palpitations, no abdominal pain, no weakness, no fatigue, no paresthesias.  No acute events overnight per nursing staff.  Objective: Vitals:   01/22/21 0745 01/22/21 1000 01/22/21 1112 01/22/21 1659  BP: (!) 160/112 114/68 132/81 (!) 131/91  Pulse:    86  Resp: '17  17 16  '$ Temp: 98.2 F (36.8 C)  98 F (36.7 C) 98.3 F (36.8 C)  TempSrc: Oral  Oral Oral  SpO2: 98%  98%   Weight:        Intake/Output Summary (Last 24 hours) at 01/22/2021 1821 Last data filed at 01/22/2021 1655 Gross per 24 hour  Intake 1542.11 ml  Output 2525 ml  Net -982.89 ml   Filed Weights   01/21/21 0509 01/22/21 0519  Weight: 118.3 kg 124.1 kg    Examination:  General exam: Appears calm and comfortable  Respiratory system: Clear to auscultation. Respiratory effort normal. Cardiovascular system: S1 & S2 heard, RRR. No JVD, murmurs, rubs, gallops or clicks. No pedal edema. Gastrointestinal system: Abdomen is nondistended, soft and nontender. No organomegaly or masses felt. Normal bowel sounds heard. Central nervous system: Alert and oriented. No focal neurological deficits. Extremities: Symmetric 5 x 5 power. Skin: No rashes, lesions or  ulcers Psychiatry: Judgement and insight appear normal. Mood & affect appropriate.     Data Reviewed: I have personally reviewed following labs and imaging studies  CBC: Recent Labs  Lab 01/20/21 0708 01/21/21 0456  WBC 8.4 8.6  HGB 11.4* 9.9*  HCT 37.1* 32.6*  MCV 68.8* 69.1*  PLT 208 [id number]   Basic Metabolic Panel: Recent Labs  Lab 01/20/21 0708 01/20/21 0838 01/21/21 0456 01/21/21 1610  NA 136  --  137 139  K 2.7*  --  2.8* 3.4*  CL 105  --  105 106  CO2 22  --  22 25  GLUCOSE 107*  --  111* 99  BUN 22*  --  23* 25*  CREATININE 1.92*  --  2.02* 2.10*  CALCIUM 9.0  --  8.3* 8.5*  MG  --  2.0  --   --    GFR: Estimated Creatinine Clearance: 62.7 mL/min (A) (by C-G formula based on SCr of 2.1 mg/dL (H)). Liver Function Tests: No results for input(s): AST, ALT, ALKPHOS, BILITOT, PROT, ALBUMIN in the last 168 hours. No results for input(s): LIPASE, AMYLASE in the last 168 hours. No results for input(s): AMMONIA in the last 168 hours. Coagulation Profile: No results for input(s): INR, PROTIME in the last 168 hours. Cardiac Enzymes: No results for input(s): CKTOTAL, CKMB, CKMBINDEX, TROPONINI in the last 168  hours. BNP (last 3 results) No results for input(s): PROBNP in the last 8760 hours. HbA1C: No results for input(s): HGBA1C in the last 72 hours. CBG: No results for input(s): GLUCAP in the last 168 hours. Lipid Profile: Recent Labs    01/20/21 1622  CHOL 163  HDL 44  LDLCALC 107*  TRIG 61  CHOLHDL 3.7   Thyroid Function Tests: No results for input(s): TSH, T4TOTAL, FREET4, T3FREE, THYROIDAB in the last 72 hours. Anemia Panel: No results for input(s): VITAMINB12, FOLATE, FERRITIN, TIBC, IRON, RETICCTPCT in the last 72 hours. Sepsis Labs: Recent Labs  Lab 01/20/21 1622 01/20/21 1748  PROCALCITON <0.10  --   LATICACIDVEN 1.1 1.0    Recent Results (from the past 240 hour(s))  Resp Panel by RT-PCR (Flu A&B, Covid) Nasopharyngeal Swab     Status: None    Collection Time: 01/20/21  8:37 AM   Specimen: Nasopharyngeal Swab; Nasopharyngeal(NP) swabs in vial transport medium  Result Value Ref Range Status   SARS Coronavirus 2 by RT PCR NEGATIVE NEGATIVE Final    Comment: (NOTE) SARS-CoV-2 target nucleic acids are NOT DETECTED.  The SARS-CoV-2 RNA is generally detectable in upper respiratory specimens during the acute phase of infection. The lowest concentration of SARS-CoV-2 viral copies this assay can detect is 138 copies/mL. A negative result does not preclude SARS-Cov-2 infection and should not be used as the sole basis for treatment or other patient management decisions. A negative result may occur with  improper specimen collection/handling, submission of specimen other than nasopharyngeal swab, presence of viral mutation(s) within the areas targeted by this assay, and inadequate number of viral copies(<138 copies/mL). A negative result must be combined with clinical observations, patient history, and epidemiological information. The expected result is Negative.  Fact Sheet for Patients:  EntrepreneurPulse.com.au  Fact Sheet for Healthcare Providers:  IncredibleEmployment.be  This test is no t yet approved or cleared by the [place] FDA and  has been authorized for detection and/or diagnosis of SARS-CoV-2 by FDA under an Emergency Use Authorization (EUA). This EUA will remain  in effect (meaning this test can be used) for the duration of the COVID-19 declaration under Section 564(b)(1) of the Act, 21 U.S.C.section 360bbb-3(b)(1), unless the authorization is terminated  or revoked sooner.       Influenza A by PCR NEGATIVE NEGATIVE Final   Influenza B by PCR NEGATIVE NEGATIVE Final    Comment: (NOTE) The Xpert Xpress SARS-CoV-2/FLU/RSV plus assay is intended as an aid in the diagnosis of influenza from Nasopharyngeal swab specimens and should not be used as a sole basis for treatment.  Nasal washings and aspirates are unacceptable for Xpert Xpress SARS-CoV-2/FLU/RSV testing.  Fact Sheet for Patients: EntrepreneurPulse.com.au  Fact Sheet for Healthcare Providers: IncredibleEmployment.be  This test is not yet approved or cleared by the [place] FDA and has been authorized for detection and/or diagnosis of SARS-CoV-2 by FDA under an Emergency Use Authorization (EUA). This EUA will remain in effect (meaning this test can be used) for the duration of the COVID-19 declaration under Section 564(b)(1) of the Act, 21 U.S.C. section 360bbb-3(b)(1), unless the authorization is terminated or revoked.  Performed at [facility], [place] [address] [place],  [address]   Culture, blood (x 2)     Status: None (Preliminary result)   Collection Time: 01/20/21  4:22 PM   Specimen: BLOOD  Result Value Ref Range Status   Specimen Description BLOOD RIGHT ANTECUBITAL  Final   Special Requests  Final    BOTTLES DRAWN AEROBIC ONLY Blood Culture adequate volume   Culture   Final    NO GROWTH 2 DAYS Performed at [facility], [place] [address] [place], [place] [address]    Report Status PENDING  Incomplete  Culture, blood (x 2)     Status: None (Preliminary result)   Collection Time: 01/20/21  4:28 PM   Specimen: BLOOD  Result Value Ref Range Status   Specimen Description BLOOD LEFT ANTECUBITAL  Final   Special Requests   Final    BOTTLES DRAWN AEROBIC AND ANAEROBIC Blood Culture adequate volume   Culture   Final    NO GROWTH 2 DAYS Performed at [facility], [place] [address] [place], [place] [address]    Report Status PENDING  Incomplete         Radiology Studies: No results found.      Scheduled Meds: . aspirin EC  81 mg Oral Daily  . atorvastatin  40 mg Oral Daily  . azithromycin  500 mg Oral Daily  . carvedilol  25 mg Oral BID WC  . docusate sodium  100 mg Oral BID  . folic acid  1 mg Oral Daily   . furosemide  40 mg Oral BID  . hydrALAZINE  100 mg Oral Q8H  . isosorbide mononitrate  30 mg Oral Daily  . LORazepam  0-4 mg Intravenous Q12H  . multivitamin with minerals  1 tablet Oral Daily  . nicotine  14 mg Transdermal Daily  . [START ON 01/23/2021] potassium chloride  10 mEq Oral BID  . rivaroxaban  20 mg Oral Q supper  . sacubitril-valsartan  1 tablet Oral BID  . sodium chloride flush  3 mL Intravenous Q12H  . spironolactone  25 mg Oral Daily  . thiamine  100 mg Oral Daily   Continuous Infusions: . cefTRIAXone (ROCEPHIN)  IV 2 g (01/22/21 0811)     LOS: 2 days    Time spent: 39 minutes spent on chart review, discussion with nursing staff, consultants, updating family and interview/physical exam; more than 50% of that time was spent in counseling and/or coordination of care.    Devany Aja J [place] DO [facility] Available via Epic secure chat 7am-7pm After these hours, please refer to coverage provider listed on [email or web address] 01/22/2021, 6:21 PM

## 2021-01-22 NOTE — Progress Notes (Addendum)
Heart Failure Stewardship Pharmacist Progress Note   PCP: Patient, [name] (Inactive) PCP-Cardiologist: Ida Rogue, MD    HPI:  40 yo M with PMH of CHF, HTN, afib, CKD IIIa, tobacco use, cocaine abuse, and non-compliance. He presented to the ED on 6/5 with chest pain, shortness of breath, and hemoptysis. CXR showed progressed and now widespread right perihilar and lower lung with indistinct pulmonary opacity and chronic severe cardiomegaly. An ECHO was done on 6/5 and showed LVEF 30-35% (stable from prior in 06/2020) and normal RV systolic function. Recent LHC in May 2022 with large but normal coronary arteries.   Current HF Medications: Furosemide 40 mg PO BID Carvedilol 25 mg BID Entresto 24/26 mg BID Spironolactone 25 mg daily Hydralazine 100 mg TID  Prior to admission HF Medications: Entresto 24/26 mg BID *Not taking carvedilol, hydralazine, spironolactone, or furosemide because he ran out of medications 3-4 months ago  Pertinent Lab Values: . Serum creatinine 2.10, BUN 25, Potassium 3.4, Sodium 139, BNP 2466.4, Magnesium 2.0  Vital Signs: . Weight: 273 lbs (possible error); admission weight: 260 lbs . Blood pressure: 140-160/100s  . Heart rate: 80s   Medication Assistance / Insurance Benefits Check: Does the patient have prescription insurance?  No  Does the patient qualify for medication assistance through manufacturers or grants?   Yes . Eligible grants and/or patient assistance programs: pending . Medication assistance applications in progress: none  . Medication assistance applications approved: Delene Loll, Xarelto Approved medication assistance renewals will be completed by: [place]:  Prior to admission outpatient [facility] Is the patient willing to use [facility] at discharge? Yes Is the patient willing to transition their outpatient pharmacy to utilize a [facility] outpatient pharmacy?   Pending    Assessment: 1. Acute on  chronic systolic CHF (EF 99991111), due to NICM. NYHA class III symptoms. - Agree with transitioning furosemide to 40 mg PO BID - Continue carvedilol 25 mg BID - Continue Entresto 24/26 mg BID. Consider increasing to 49/51 mg for further BP reduction - Continue spironolactone 25 mg daily - can consider increasing to 50 mg daily for resistant HTN - Agree with increasing hydralazine to 100 mg TID - Consider adding Imdur 30 mg daily   Plan: 1) Medication changes recommended at this time: - Agree with changes as above - Add Imdur 30 mg daily  2) Patient assistance: - Patient previously approved for Entresto patient assistance (through 07/13/21) for $0 per month - he has already been receiving this through the mail  - Patient previously approved for Xarelto patient assistance (through 07/20/21) for $0 per month - informed patient on how to request delivery of medication to his home. Discussed with cardiology and they would like him to resume this since cost will no longer be an issue.  - HF TOC clinic appt set up for 6/14. Will involve CSW to help with insurance and medication accessibility concerns.   3)  Education  - Patient has been educated on current HF medications and potential additions to HF medication regimen  - Patient verbalizes understanding that over the next few months, these medication doses may change and more medications may be added to optimize HF regimen - Patient has been educated on basic disease state pathophysiology and goals of therapy - Time spent (30 mins)  Kerby Nora, PharmD, BCPS Heart Failure Stewardship Pharmacist Phone [phone]

## 2021-01-22 NOTE — Progress Notes (Addendum)
Heart Failure Navigation Team Progress Note  PCP: Patient, [name] (Inactive) Primary Cardiologist: Ida Rogue, MD Admitted from: home  Past Medical History:  Diagnosis Date  . Arrhythmia   . CHF (congestive heart failure) ([place])   . Dysrhythmia    brief episode afib, cardioverted did not return  . Hypertension   . Polysubstance abuse ([place])     Social History   Socioeconomic History  . Marital status: Single    Spouse name: Not on file  . Number of children: Not on file  . Years of education: Not on file  . Highest education level: Not on file  Occupational History  . 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 Smoking status: Current Every Day Smoker    Packs/day: 0.75    Years: 28.00    Pack years: 21.00    Types: Cigarettes  . Smokeless tobacco: Never Used  . Tobacco comment: requests patch  Vaping Use  . Vaping Use: Never used  Substance and Sexual Activity  . Alcohol use: Yes    Comment: occas.   . Drug use: Yes    Types: Cocaine, Marijuana    Comment: uses marijuana occasionally, reports last use of cocaine 1 month ago as of 01/20/21  . Sexual activity: Not on file  Other Topics Concern  . Not on file  Social History Narrative  . Not on file   Social Determinants of Health   Financial Resource Strain: Medium Risk  . Difficulty of Paying Living Expenses: Somewhat hard  Food Insecurity: Food Insecurity Present  . Worried About Charity fundraiser in the Last Year: Sometimes true  . Ran Out of Food in the Last Year: Sometimes true  Transportation Needs: No Transportation Needs  . Lack of Transportation (Medical): No  . Lack of Transportation (Non-Medical): No  Physical Activity: Not on file  Stress: Not on file  Social Connections: Not on file     Heart & Vascular Transition of Care Clinic follow-up: Scheduled for 01/29/2021 at 2:00pm.  Confirmed patient has transportation.  Immediate social needs: Mr. Diop reported not having any  health insurance and CSW provided patient with a CAFA application and encouraged him to bring it with him filled out to his HV TOC follow up appointment. Patient reported that he lives where he is employed but would like a place of his own. Mr. Schnack reported that he would benefit from a disability and food stamp application. CSW obtained their signature for the Disability and Food Stamp referral for the [facility] to reach out to them. CSW faxed over the Food Stamp application and informed the patient that the [facility] will be in touch. CSW scheduled Mr. Hiles a new patient appointment at [facility] for 02/27/2021 at 4:10pm with [place], PA-C. CSW spoke with the patient about the [facility] clinic appointment and following up at the clinic for further care and getting with the outpatient social worker to discuss housing options. Mr. Lastinger reported he will follow up at [place] clinic.  Nhia Heaphy, MSW, [place] Heart Failure Social Worker

## 2021-01-22 NOTE — Progress Notes (Signed)
Heart Failure Patient Advocate Encounter  Contacted Xarelto patient assistance and was provided with copay card information that will allow for Xarelto to be dispensed at $0 copay through 07/20/21. Will send information to [facility] pharmacy to allow him to receive this at discharge.   RxBin: [id number] PCN: N/[id number] Member ID: [id number] Group ID: [id number]  Kerby Nora, PharmD, BCPS Heart Failure Stewardship Pharmacist Phone [phone]  Please check [email or web address] for unit-specific pharmacist phone numbers

## 2021-01-23 ENCOUNTER — Other Ambulatory Visit (HOSPITAL_COMMUNITY): Payer: Self-pay

## 2021-01-23 LAB — BASIC METABOLIC PANEL
Anion gap: 7 (ref 5–15)
BUN: 28 mg/dL — ABNORMAL HIGH (ref 6–20)
CO2: 25 mmol/L (ref 22–32)
Calcium: 9 mg/dL (ref 8.9–10.3)
Chloride: 105 mmol/L (ref 98–111)
Creatinine, Ser: 2.13 mg/dL — ABNORMAL HIGH (ref 0.61–1.24)
GFR, Estimated: 39 mL/min — ABNORMAL LOW (ref >60)
Glucose, Bld: 118 mg/dL — ABNORMAL HIGH (ref 70–99)
Potassium: 3.8 mmol/L (ref 3.5–5.1)
Sodium: 137 mmol/L (ref 135–145)

## 2021-01-23 LAB — MAGNESIUM: Magnesium: 2 mg/dL (ref 1.7–2.4)

## 2021-01-23 MED ORDER — HYDRALAZINE HCL 100 MG PO TABS
100.0000 mg | ORAL_TABLET | Freq: Three times a day (TID) | ORAL | 2 refills | Status: DC
  Filled 2021-01-23: qty 90, 30d supply, fill #0

## 2021-01-23 MED ORDER — ASPIRIN 81 MG PO TBEC
81.0000 mg | DELAYED_RELEASE_TABLET | Freq: Every day | ORAL | 2 refills | Status: DC
Start: 2021-01-24 — End: 2021-04-02
  Filled 2021-01-23: qty 30, 30d supply, fill #0

## 2021-01-23 MED ORDER — SPIRONOLACTONE 25 MG PO TABS
25.0000 mg | ORAL_TABLET | Freq: Every day | ORAL | 2 refills | Status: DC
  Filled 2021-01-23: qty 30, 30d supply, fill #0

## 2021-01-23 MED ORDER — NICOTINE 14 MG/24HR TD PT24
14.0000 mg | MEDICATED_PATCH | TRANSDERMAL | 0 refills | Status: DC
  Filled 2021-01-23: qty 10, 10d supply, fill #0

## 2021-01-23 MED ORDER — ISOSORBIDE MONONITRATE ER 30 MG PO TB24
30.0000 mg | ORAL_TABLET | Freq: Every day | ORAL | 2 refills | Status: DC
  Filled 2021-01-23: qty 30, 30d supply, fill #0

## 2021-01-23 MED ORDER — POTASSIUM CHLORIDE CRYS ER 10 MEQ PO TBCR
10.0000 meq | EXTENDED_RELEASE_TABLET | Freq: Two times a day (BID) | ORAL | 2 refills | Status: DC
  Filled 2021-01-23: qty 60, 30d supply, fill #0

## 2021-01-23 MED ORDER — ATORVASTATIN CALCIUM 40 MG PO TABS
40.0000 mg | ORAL_TABLET | Freq: Every day | ORAL | 2 refills | Status: DC
  Filled 2021-01-23: qty 30, 30d supply, fill #0

## 2021-01-23 MED ORDER — RIVAROXABAN 20 MG PO TABS
20.0000 mg | ORAL_TABLET | Freq: Every day | ORAL | 2 refills | Status: DC
  Filled 2021-01-23: qty 30, 30d supply, fill #0

## 2021-01-23 MED ORDER — ENTRESTO 24-26 MG PO TABS
1.0000 | ORAL_TABLET | Freq: Two times a day (BID) | ORAL | 2 refills | Status: DC
  Filled 2021-01-23: qty 60, 30d supply, fill #0

## 2021-01-23 MED ORDER — CARVEDILOL 25 MG PO TABS
25.0000 mg | ORAL_TABLET | Freq: Two times a day (BID) | ORAL | 2 refills | Status: DC
  Filled 2021-01-23: qty 60, 30d supply, fill #0

## 2021-01-23 MED ORDER — FUROSEMIDE 40 MG PO TABS
40.0000 mg | ORAL_TABLET | Freq: Two times a day (BID) | ORAL | 2 refills | Status: DC
  Filled 2021-01-23: qty 60, 30d supply, fill #0

## 2021-01-23 MED ORDER — NICOTINE 7 MG/24HR TD PT24
7.0000 mg | MEDICATED_PATCH | TRANSDERMAL | 0 refills | Status: DC
  Filled 2021-01-23: qty 21, 21d supply, fill #0

## 2021-01-23 NOTE — Progress Notes (Addendum)
Progress Note  Patient Name: Gerald [name] Date of Encounter: 01/23/2021  [facility] Cardiologist: Ida Rogue, MD; likely to follow with Adv HF going forward   Subjective   Feeling good this morning. Breathing is back to normal. No complaints of chest pain, SOB, or palpitations. We discussed plans for managing his CHF/HTN going forward. He seems motivated to improve his compliance with medications and follow-up appointments. He is planning to continue nicotine patches after discharge and avoid cocaine.   Inpatient Medications    Scheduled Meds: . aspirin EC  81 mg Oral Daily  . atorvastatin  40 mg Oral Daily  . azithromycin  500 mg Oral Daily  . carvedilol  25 mg Oral BID WC  . docusate sodium  100 mg Oral BID  . folic acid  1 mg Oral Daily  . furosemide  40 mg Oral BID  . hydrALAZINE  100 mg Oral Q8H  . isosorbide mononitrate  30 mg Oral Daily  . LORazepam  0-4 mg Intravenous Q12H  . multivitamin with minerals  1 tablet Oral Daily  . nicotine  14 mg Transdermal Daily  . potassium chloride  10 mEq Oral BID  . rivaroxaban  20 mg Oral Q supper  . sacubitril-valsartan  1 tablet Oral BID  . sodium chloride flush  3 mL Intravenous Q12H  . spironolactone  25 mg Oral Daily  . thiamine  100 mg Oral Daily   Continuous Infusions: . cefTRIAXone (ROCEPHIN)  IV Stopped (01/22/21 1900)   PRN Meds: acetaminophen **OR** acetaminophen, benzonatate, bisacodyl, guaiFENesin-dextromethorphan, hydrALAZINE, LORazepam **OR** LORazepam, ondansetron **OR** ondansetron (ZOFRAN) IV, polyethylene glycol   Vital Signs    Vitals:   01/22/21 2101 01/22/21 2102 01/23/21 0522 01/23/21 0808  BP: 123/88 123/88 (!) 150/110 (!) 167/119  Pulse:  88 89 91  Resp:  '17 17 18  '$ Temp:  97.8 F (36.6 C) 97.9 F (36.6 C) (!) 97.2 F (36.2 C)  TempSrc:  Oral Oral Axillary  SpO2:  98% 95% 98%  Weight:   124.9 kg     Intake/Output Summary (Last 24 hours) at 01/23/2021 0916 Last data filed at 01/23/2021  0843 Gross per 24 hour  Intake 2708 ml  Output 2875 ml  Net -167 ml   Last 3 Weights 01/23/2021 01/22/2021 01/21/2021  Weight (lbs) 275 lb 6.4 oz 273 lb 8 oz 260 lb 12.4 oz  Weight (kg) 124.921 kg 124.059 kg 118.286 kg      Telemetry    Sinus rhythm with chronic LBBB, occasional PACs/PVCs - Personally Reviewed  ECG    No new tracings - Personally Reviewed  Physical Exam   GEN: Sitting in bed in no acute distress.   Neck: No JVD Cardiac: RRR, no murmurs, rubs, or gallops.  Respiratory: Clear to auscultation bilaterally. GI: Soft, nontender, non-distended  MS: No edema; No deformity. Neuro:  Nonfocal  Psych: Normal affect   Labs    High Sensitivity Troponin:   Recent Labs  Lab 01/20/21 0708 01/20/21 0848  TROPONINIHS 628* 611*      Chemistry Recent Labs  Lab 01/21/21 0456 01/21/21 1610 01/23/21 0348  NA 137 139 137  K 2.8* 3.4* 3.8  CL 105 106 105  CO2 '22 25 25  '$ GLUCOSE 111* 99 118*  BUN 23* 25* 28*  CREATININE 2.02* 2.10* 2.13*  CALCIUM 8.3* 8.5* 9.0  GFRNONAA 42* 40* 39*  ANIONGAP '10 8 7     '$ Hematology Recent Labs  Lab 01/20/21 0708 01/21/21 0456  WBC 8.4 8.6  RBC 5.39 4.72  HGB 11.4* 9.9*  HCT 37.1* 32.6*  MCV 68.8* 69.1*  MCH 21.2* 21.0*  MCHC 30.7 30.4  RDW 18.3* 17.9*  PLT 208 200    BNP Recent Labs  Lab 01/20/21 0837  BNP 2,466.4*     DDimer No results for input(s): DDIMER in the last 168 hours.   Radiology    No results found.  Cardiac Studies   Echocardiogram: 06/2020 IMPRESSIONS    1. Left ventricular ejection fraction, by estimation, is 30 to 35%. The  left ventricle has moderately decreased function. The left ventricle  demonstrates global hypokinesis. The left ventricular internal cavity size  was mildly dilated. There is severe  left ventricular hypertrophy. Left ventricular diastolic parameters are  consistent with [name] diastolic dysfunction (pseudonormalization).  Elevated left atrial pressure.  2. Right  ventricular systolic function is normal. The right ventricular  size is normal. Tricuspid regurgitation signal is inadequate for assessing  PA pressure.  3. Left atrial size was severely dilated.  4. The mitral valve is normal in structure. Trivial mitral valve  regurgitation.  5. The aortic valve is tricuspid. Aortic valve regurgitation is trivial.  No aortic stenosis is present.  6. Aortic dilatation noted. There is mild dilatation of the aortic root,  measuring 40 mm. There is mild dilatation of the ascending aorta,  measuring 39 mm.  7. The inferior vena cava is dilated in size with >50% respiratory  variability, suggesting right atrial pressure of 8 mmHg.   LHC: [id number]   LV end diastolic pressure is moderately elevated.  1. Very large and normal coronary arteries 2. Moderately elevated LVEDP 24 mm Hg  Plan: focus on BP control and optimization of CHF therapy.  Echocardiogram 01/20/21: 1. Left ventricular ejection fraction, by estimation, is 30 to 35%. The  left ventricle has moderately decreased function. The left ventricle  demonstrates global hypokinesis. The left ventricular internal cavity size  was mildly dilated. There is severe  left ventricular hypertrophy. Left ventricular diastolic parameters are  indeterminate.  2. Right ventricular systolic function is normal. The right ventricular  size is normal. Moderately increased right ventricular wall thickness.  Tricuspid regurgitation signal is inadequate for assessing PA pressure.  3. Left atrial size was severely dilated.  4. Right atrial size was mildly dilated.  5. The mitral valve is normal in structure. Trivial mitral valve  regurgitation.  6. The aortic valve is tricuspid. Aortic valve regurgitation is trivial.  No aortic stenosis is present.  7. The inferior vena cava is dilated in size with >50% respiratory  variability, suggesting right atrial pressure of 8 mmHg.    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 Acute on chronic combined CHF/NICM:patient presented with chest pain and SOB. Again non-compliant with medication due to cost. BP markedly elevated. BNP elevated to 2466. HsTrop [id number]. CXR indistinct lower lung opacities and severe cardiomegaly. He was restarted on carvedilol, spironolactone, and hydralazine, in addition to entresto which is the only medication he was compliant with. He is diuresing with IV>po lasix '40mg'$  BID with UOP net -1.2L in the past 24 hours and -4.0L this admission. ?accuracy of weight trend 260lbs>273>275lbs. Cr stable at 2.1 from 1.9 on admission. Echo showed EF 30-35%, global hypokinesis, indeterminate  LV diastolic function, severe LVH and moderate RVH, severe LAE, mild RAE, and no significant valvular abnormalities. His cardiomyopathy is likely 2/2 poorly controlled HTN - Continue carvedilol, entresto, hydralazine, imdur, lasix, and spironolactone - Continue to monitor strict I&Os and daily weights - He is scheduled to follow-up in the HF [facility] clinic 01/29/21. Appreciate HF pharmacy/SW assistance  2. HTN urgency:BP markedly elevated on arrival. Remains intermittently elevated, though overall improved; even with some good readings with SBP <130 yesterday  - Managed in the context of #1.   3. Elevated troponin:trop trend 628>611 - low flat trend not c/w ACS. CP only occurs with coughing/deep breathing. Likely demand ischemia in the setting of #1 and 2. Echo stable with EF 30-35% and no RWMA. Had cath 12/2020 which showed normal coronary arteries. - No further cardiac work-up at this time  4. Paroxysmal atrial fibrillation: maintaining sinus rhythm. Not compliant with xarelto. HF pharmacist was able to get patient assistance  approval through 07/20/21 for $0/month. Xarelto restarted 01/21/21 given CHA2DS2-VASc Score = 2 [CHF History: Yes, HTN History: Yes, Diabetes History: No, Stroke History: No, Vascular Disease History: No, Age Score: 0, Gender Score: 0].  Therefore, the patient's annual risk of stroke is 2.2 %.    - Continue carvedilol for rate control - Continue xarelto for stroke ppx  5. Hypokalemia: significantly low K 2.7 on admission, gradually improving 2.8>3.4>3.8 with aggressive repletion. Now on scheduled 10 mEq BID - Continue to monitor closely and replete as needed to maintain K >4 - Will need BMET next week for close monitoring  6. NSVT:No significant episodes overnight  - Continue potassium repletion as above - Continue to monitor closely and replete as needed to maintain K >4 and Mg >2  7. CKD stage 3a:stable at Cr 2.1 today - Continue to monitor closely with ongoing diuresis.  - Will need BMET next week for close monitoring  8. Substance abuse:continues to smoke and use cocaine - Continue to encourage cessation  9. [id number] yesterday afternoon. On IV antibiotics for possible superimposed PNA. Patient reports improvement in breathing/coughing - Continue management per primary team.   For questions or updates, please contact [place] Please consult [email or web address] for contact info under        Signed, Abigail Butts, PA-C  01/23/2021, 9:16 AM     Attending Note:   The patient was seen and examined.  Agree with assessment and plan as noted above.  Changes made to the above note as needed.  Patient seen and independently examined with Roby Lofts, PA .   We discussed all aspects of the encounter. I agree with the assessment and plan as stated above.  1.  Acute on chronic combined CHF:   Cont meds On entresto, spiro, coreg, lasix Kdur Follow up in the CHF clinic  2.  Hypokalemia:  Potassium is 3.8 today  Recheck in several weeks  3.  HTN:   Cont meds.   bp  is well controlled.      I have spent a total of 40 minutes with patient reviewing hospital  notes , telemetry, EKGs, labs and examining patient as well as establishing an assessment and plan that was discussed with the patient. > 50% of time was spent in direct patient care.     Thayer Headings, Brooke Bonito., MD, [facility] 01/23/2021, 10:46 AM [address]  [place] Pager [phone]

## 2021-01-23 NOTE — Progress Notes (Signed)
Heart Failure Stewardship Pharmacist Progress Note   PCP: Patient, [name] (Inactive) PCP-Cardiologist: Ida Rogue, MD    HPI:  40 yo M with PMH of CHF, HTN, afib, CKD IIIa, tobacco use, cocaine abuse, and non-compliance. He presented to the ED on 6/5 with chest pain, shortness of breath, and hemoptysis. CXR showed progressed and now widespread right perihilar and lower lung with indistinct pulmonary opacity and chronic severe cardiomegaly. An ECHO was done on 6/5 and showed LVEF 30-35% (stable from prior in 06/2020) and normal RV systolic function. Recent LHC in May 2022 with large but normal coronary arteries.   Discharge HF Medications: Furosemide 40 mg PO BID Carvedilol 25 mg BID Entresto 24/26 mg BID Spironolactone 25 mg daily Hydralazine 100 mg TID Imdur 30 mg daily  Prior to admission HF Medications: Entresto 24/26 mg BID *Not taking carvedilol, hydralazine, spironolactone, or furosemide because he ran out of medications 3-4 months ago  Pertinent Lab Values: . Serum creatinine 2.13, BUN 28, Potassium 3.8, Sodium 137, BNP 2466.4, Magnesium 2.0  Vital Signs: . Weight: 275 lbs (possible error); admission weight: 260 lbs . Blood pressure: 130-160/100s  . Heart rate: 80s   Medication Assistance / Insurance Benefits Check: Does the patient have prescription insurance?  No  Does the patient qualify for medication assistance through manufacturers or grants?   Yes . Eligible grants and/or patient assistance programs: pending . Medication assistance applications in progress: none  . Medication assistance applications approved: Delene Loll, Xarelto Approved medication assistance renewals will be completed by: [place]:  Prior to admission outpatient pharmacy: [facility] Is the patient willing to use [facility] at discharge? Yes Is the patient willing to transition their outpatient pharmacy to utilize a [facility] outpatient pharmacy?   Pending     Assessment: 1. Acute on chronic systolic CHF (EF 99991111), due to NICM. NYHA class III symptoms. - Continue furosemide 40 mg PO BID - Continue carvedilol 25 mg BID - Continue Entresto 24/26 mg BID. Consider increasing to 49/51 mg for further BP reduction - Continue spironolactone 25 mg daily  - Continue hydralazine 100 mg TID - Continue Imdur 30 mg daily   Plan: 1) Medication changes recommended at this time: - Agree with changes as above - Add Imdur 30 mg daily  2) Patient assistance: - Patient previously approved for Entresto patient assistance (through 07/13/21) for $0 per month - he has already been receiving this through the mail  - Patient previously approved for Xarelto patient assistance (through 07/20/21) for $0 per month - called Xarelto pt assistance and was provided with monthly copay card information that allows each fill to be $0 from any local pharmacy. Discussed with cardiology and they would like him to resume this since cost will no longer be an issue.  - HF TOC clinic appt set up for 6/14. Will involve CSW to help with insurance and medication accessibility concerns.   3)  Education  - Patient has been educated on current HF medications and potential additions to HF medication regimen  - Patient verbalizes understanding that over the next few months, these medication doses may change and more medications may be added to optimize HF regimen - Patient has been educated on basic disease state pathophysiology and goals of therapy - Time spent (30 mins)  Kerby Nora, PharmD, BCPS Heart Failure Stewardship Pharmacist Phone [phone]

## 2021-01-23 NOTE — Discharge Instructions (Addendum)
Community-Acquired Pneumonia, Adult Pneumonia is an infection of the lungs. It causes irritation and swelling in the airways of the lungs. Mucus and fluid may also build up inside the airways. This may cause coughing and trouble breathing. One type of pneumonia can happen while you are in a hospital. A different type can happen when you are not in a hospital (community-acquired pneumonia). What are the causes? This condition is caused by germs (viruses, bacteria, or fungi). Some types of germs can spread from person to person. Pneumonia is not thought to spread from person to person.   What increases the risk? You are more likely to develop this condition if:  You have a long-term (chronic) disease, such as: ? Disease of the lungs. This may be chronic obstructive pulmonary disease (COPD) or asthma. ? Heart failure. ? Cystic fibrosis. ? Diabetes. ? Kidney disease. ? Sickle cell disease. ? HIV.  You have other health problems, such as: ? Your body's defense system (immune system) is weak. ? A condition that may cause you to breathe in fluids from your mouth and nose.  You had your spleen taken out.  You do not take good care of your teeth and mouth (poor dental hygiene).  You use or have used tobacco products.  You travel where the germs that cause this illness are common.  You are near certain animals or the places they live.  You are older than 40 years of age. What are the signs or symptoms? Symptoms of this condition include:  A cough.  A fever.  Sweating or chills.  Chest pain, often when you breathe deeply or cough.  Breathing problems, such as: ? Fast breathing. ? Trouble breathing. ? Shortness of breath.  Feeling tired (fatigued).  Muscle aches. How is this treated? Treatment for this condition depends on many things, such as:  The cause of your illness.  Your medicines.  Your other health problems. Most adults can be treated at home. Sometimes,  treatment must happen in a hospital.  Treatment may include medicines to kill germs.  Medicines may depend on which germ caused your illness. Very bad pneumonia is rare. If you get it, you may:  Have a machine to help you breathe.  Have fluid taken away from around your lungs. Follow these instructions at home: Medicines  Take over-the-counter and prescription medicines only as told by your doctor.  Take cough medicine only if you are losing sleep. Cough medicine can keep your body from taking mucus away from your lungs.  If you were prescribed an antibiotic medicine, take it as told by your doctor. Do not stop taking the antibiotic even if you start to feel better. Lifestyle  Do not drink alcohol.  Do not use any products that contain nicotine or tobacco, such as cigarettes, e-cigarettes, and chewing tobacco. If you need help quitting, ask your doctor.  Eat a healthy diet. This includes a lot of vegetables, fruits, whole grains, low-fat dairy products, and low-fat (lean) protein.      General instructions  Rest a lot. Sleep for at least 8 hours each night.  Sleep with your head and neck raised. Put a few pillows under your head or sleep in a reclining chair.  Return to your normal activities as told by your doctor. Ask your doctor what activities are safe for you.  Drink enough fluid to keep your pee (urine) pale yellow.  If your throat is sore, rinse your mouth often with salt water. To make  salt water, dissolve -1 tsp (3-6 g) of salt in 1 cup (237 mL) of warm water.  Keep all follow-up visits as told by your doctor. This is important.   How is this prevented? You can lower your risk of pneumonia by:  Getting the pneumonia shot (vaccine). These shots have different types and schedules. Ask your doctor what works best for you. Think about getting this shot if: ? You are older than 40 years of age. ? You are 23-44 years of age and:  You are being treated for  cancer.  You have long-term lung disease.  You have other problems that affect your body's defense system. Ask your doctor if you have one of these.  Getting your flu shot every year. Ask your doctor which type of shot is best for you.  Going to the dentist as often as told.  Washing your hands often with soap and water for at least 20 seconds. If you cannot use soap and water, use hand sanitizer. Contact a doctor if:  You have a fever.  You lose sleep because your cough medicine does not help. Get help right away if:  You are short of breath and this gets worse.  You have more chest pain.  Your sickness gets worse. This is very serious if: ? You are an older adult. ? Your body's defense system is weak.  You cough up blood. These symptoms may be an emergency. Do not wait to see if the symptoms will go away. Get medical help right away. Call your local emergency services (911 in the [place] Do not drive yourself to the hospital. Summary  Pneumonia is an infection of the lungs.  Community-acquired pneumonia affects people who have not been in the hospital. Certain germs can cause this infection.  This condition may be treated with medicines that kill germs.  For very bad pneumonia, you may need a hospital stay and treatment to help with breathing. This information is not intended to replace advice given to you by your health care provider. Make sure you discuss any questions you have with your health care provider. Document Revised: 05/17/2019 Document Reviewed: 05/17/2019 Elsevier Patient Education  [place]. Heart Failure Education: 1. Weigh yourself EVERY morning after you go to the bathroom but before you eat or drink anything. Write this number down in a weight log/diary. If you gain 3 pounds overnight or 5 pounds in a week, call the office. 2. Take your medicines as prescribed. If you have concerns about your medications, please call us before you stop taking  them.  3. Eat low salt foods--Limit salt (sodium) to 2000 mg per day. This will help prevent your body from holding onto fluid. Read food labels as many processed foods have a lot of sodium, especially canned goods and prepackaged meats. If you would like some assistance choosing low sodium foods, we would be happy to set you up with a nutritionist. 4. Stay as active as you can everyday. Staying active will give you more energy and make your muscles stronger. Start with 5 minutes at a time and work your way up to 30 minutes a day. Break up your activities--do some in the morning and some in the afternoon. Start with 3 days per week and work your way up to 5 days as you can.  If you have chest pain, feel short of breath, dizzy, or lightheaded, STOP. If you don't feel better after a short rest, call 911. If you do  feel better, call the office to let us know you have symptoms with exercise. 5. Limit all fluids for the day to less than 2 liters. Fluid includes all drinks, coffee, juice, ice chips, soup, jello, and all other liquids.    Low Sodium Nutrition Therapy  Eating less sodium can help you if you have high blood pressure, heart failure, or kidney or liver disease.   Your body needs a little sodium, but too much sodium can cause your body to hold onto extra water. This extra water will raise your blood pressure and can cause damage to your heart, kidneys, or liver as they are forced to work harder.   Sometimes you can see how the extra fluid affects you because your hands, legs, or belly swell. You may also hold water around your heart and lungs, which makes it hard to breathe.   Even if you take medication for blood pressure or a water pill (diuretic) to remove fluid, it is still important to have less salt in your diet.   Check with your primary care provider before drinking alcohol since it may affect the amount of fluid in your body and how your heart, kidneys, or liver work. Sodium in Food A  low-sodium meal plan limits the sodium that you get from food and beverages to 1,500-2,000 milligrams (mg) per day. Salt is the main source of sodium. Read the nutrition label on the package to find out how much sodium is in one serving of a food.  . Select foods with 140 milligrams (mg) of sodium or less per serving.  . You may be able to eat one or two servings of foods with a little more than 140 milligrams (mg) of sodium if you are closely watching how much sodium you eat in a day.  . Check the serving size on the label. The amount of sodium listed on the label shows the amount in one serving of the food. So, if you eat more than one serving, you will get more sodium than the amount listed.  Tips Cutting Back on Sodium . Eat more fresh foods.  . Fresh fruits and vegetables are low in sodium, as well as frozen vegetables and fruits that have no added juices or sauces.  . Fresh meats are lower in sodium than processed meats, such as bacon, sausage, and hotdogs.  . Not all processed foods are unhealthy, but some processed foods may have too much sodium.  . Eat less salt at the table and when cooking. One of the ingredients in salt is sodium.  . One teaspoon of table salt has 2,300 milligrams of sodium.  . Leave the salt out of recipes for pasta, casseroles, and soups. . Be a smart shopper.  . Food packages that say "Salt-free", sodium-free", "very low sodium," and "low sodium" have less than 140 milligrams of sodium per serving.  . Beware of products identified as "Unsalted," "No Salt Added," "Reduced Sodium," or "Lower Sodium." These items may still be high in sodium. You should always check the nutrition label. . Add flavors to your food without adding sodium.  . Try lemon juice, lime juice, or vinegar.  . Dry or fresh herbs add flavor.  Sharyn Lull a sodium-free seasoning blend or make your own at home. . You can purchase salt-free or sodium-free condiments like barbeque sauce in stores and  online. Ask your registered dietitian nutritionist for recommendations and where to find them.  .  Eating in Restaurants . Choose foods  carefully when you eat outside your home. Restaurant foods can be very high in sodium. Many restaurants provide nutrition facts on their menus or their websites. If you cannot find that information, ask your server. Let your server know that you want your food to be cooked without salt and that you would like your salad dressing and sauces to be served on the side.  .   . Foods Recommended . Food Group . Foods Recommended  . Grains . Bread, bagels, rolls without salted tops Homemade bread made with reduced-sodium baking powder Cold cereals, especially shredded wheat and puffed rice Oats, grits, or cream of wheat Pastas, quinoa, and rice Popcorn, pretzels or crackers without salt Corn tortillas  . Protein Foods . Fresh meats and fish; [place] bacon (check the nutrition labels - make sure they are not packaged in a sodium solution) Canned or packed tuna (no more than 4 ounces at 1 serving) Beans and peas Soybeans) and tofu Eggs Nuts or nut butters without salt  . Dairy . Milk or milk powder Plant milks, such as rice and soy Yogurt, including Greek yogurt Small amounts of natural cheese (blocks of cheese) or reduced-sodium cheese can be used in moderation. (Swiss, ricotta, and fresh mozzarella cheese are lower in sodium than the others) Cream Cheese Low sodium cottage cheese  . Vegetables . Fresh and frozen vegetables without added sauces or salt Homemade soups (without salt) Low-sodium, salt-free or sodium-free canned vegetables and soups  . Fruit . Fresh and canned fruits Dried fruits, such as raisins, cranberries, and prunes  . Oils . Tub or liquid margarine, regular or without salt [place]ola, corn, peanut, olive, safflower, or sunflower oils  . Condiments . Fresh or dried herbs such as basil, bay leaf, dill, mustard (dry), nutmeg, paprika, parsley,  rosemary, sage, or thyme.  Low sodium ketchup Vinegar  Lemon or lime juice Pepper, red pepper flakes, and cayenne. Hot sauce contains sodium, but if you use just a drop or two, it will not add up to much.  Salt-free or sodium-free seasoning mixes and marinades Simple salad dressings: vinegar and oil  .  Marland Kitchen Foods Not Recommended . Food Group . Foods Not Recommended  . Grains . Breads or crackers topped with salt Cereals (hot/cold) with more than 300 mg sodium per serving Biscuits, cornbread, and other "quick" breads prepared with baking soda Pre-packaged bread crumbs Seasoned and packaged rice and pasta mixes Self-rising flours  . Protein Foods . Cured meats: Bacon, ham, sausage, pepperoni and hot dogs Canned meats (chili, vienna sausage, or sardines) Smoked fish and meats Frozen meals that have more than 600 mg of sodium per serving Egg substitute (with added sodium)  . Dairy . Buttermilk Processed cheese spreads Cottage cheese (1 cup may have over 500 mg of sodium; look for low-sodium.) American or feta cheese Shredded Cheese has more sodium than blocks of cheese String cheese  . Vegetables . Canned vegetables (unless they are salt-free, sodium-free or low sodium) Frozen vegetables with seasoning and sauces Sauerkraut and pickled vegetables Canned or dried soups (unless they are salt-free, sodium-free, or low sodium) [place] fries and onion rings  . Fruit . Dried fruits preserved with additives that have sodium  . Oils . Salted butter or margarine, all types of olives  . Condiments . Salt, sea salt, kosher salt, onion salt, and garlic salt Seasoning mixes with salt Bouillon cubes Ketchup Barbeque sauce and Worcestershire sauce unless low sodium Soy sauce Salsa, pickles, olives, relish Salad dressings: ranch, blue  cheese, [place], and [place].  .  . Low Sodium Sample 1-Day Menu  . Breakfast . 1 cup cooked oatmeal  . 1 slice whole wheat bread toast  . 1 tablespoon peanut  butter without salt  . 1 banana  . 1 cup 1% milk  . Lunch . Tacos made with: 2 corn tortillas  .  cup black beans, low sodium  .  cup roasted or grilled chicken (without skin)  .  avocado  . Squeeze of lime juice  . 1 cup salad greens  . 1 tablespoon low-sodium salad dressing  .  cup strawberries  . 1 orange  . Afternoon Snack . 1/3 cup grapes  . 6 ounces yogurt  . Evening Meal . 3 ounces herb-baked fish  . 1 baked potato  . 2 teaspoons olive oil  .  cup cooked carrots  . 2 thick slices tomatoes on:  . 2 lettuce leaves  . 1 teaspoon olive oil  . 1 teaspoon balsamic vinegar  . 1 cup 1% milk  . Evening Snack . 1 apple  .  cup almonds without salt  .  Marland Kitchen Low-Sodium Vegetarian (Lacto-Ovo) Sample 1-Day Menu  . Breakfast . 1 cup cooked oatmeal  . 1 slice whole wheat toast  . 1 tablespoon peanut butter without salt  . 1 banana  . 1 cup 1% milk  . Lunch . Tacos made with: 2 corn tortillas  .  cup black beans, low sodium  .  cup roasted or grilled chicken (without skin)  .  avocado  . Squeeze of lime juice  . 1 cup salad greens  . 1 tablespoon low-sodium salad dressing  .  cup strawberries  . 1 orange  . Evening Meal . Stir fry made with:  cup tofu  . 1 cup brown rice  .  cup broccoli  .  cup green beans  .  cup peppers  .  tablespoon peanut oil  . 1 orange  . 1 cup 1% milk  . Evening Snack . 4 strips celery  . 2 tablespoons hummus  . 1 hard-boiled egg  .  Marland Kitchen Low-Sodium Vegan Sample 1-Day Menu  . Breakfast . 1 cup cooked oatmeal  . 1 tablespoon peanut butter without salt  . 1 cup blueberries  . 1 cup soymilk fortified with calcium, vitamin B12, and vitamin D  . Lunch . 1 small whole wheat pita  .  cup cooked lentils  . 2 tablespoons hummus  . 4 carrot sticks  . 1 medium apple  . 1 cup soymilk fortified with calcium, vitamin B12, and vitamin D  . Evening Meal . Stir fry made with:  cup tofu  . 1 cup brown rice  .  cup broccoli  .  cup green  beans  .  cup peppers  .  tablespoon peanut oil  . 1 cup cantaloupe  . Evening Snack . 1 cup soy yogurt  .  cup mixed nuts  . Copyright 2020  Academy of Nutrition and Dietetics. All rights reserved .  Marland Kitchen Sodium Free Flavoring Tips .  Marland Kitchen When cooking, the following items may be used for flavoring instead of salt or seasonings that contain sodium. . Remember: A little bit of spice goes a long way! Be careful not to overseason. Marland Kitchen Spice Blend Recipe (makes about ? cup) . 5 teaspoons onion powder  . 2 teaspoons garlic powder  . 2 teaspoons paprika  . 2 teaspoon dry mustard  . 1 teaspoon  crushed thyme leaves  .  teaspoon white pepper  .  teaspoon celery seed Food Item Flavorings  Beef Basil, bay leaf, caraway, curry, dill, dry mustard, garlic, grape jelly, green pepper, mace, marjoram, mushrooms (fresh), nutmeg, onion or onion powder, parsley, pepper, rosemary, sage  Chicken Basil, cloves, cranberries, mace, mushrooms (fresh), nutmeg, oregano, paprika, parsley, pineapple, saffron, sage, savory, tarragon, thyme, tomato, turmeric  Egg Chervil, curry, dill, dry mustard, garlic or garlic powder, green pepper, jelly, mushrooms (fresh), nutmeg, onion powder, paprika, parsley, rosemary, tarragon, tomato  Fish Basil, bay leaf, chervil, curry, dill, dry mustard, green pepper, lemon juice, marjoram, mushrooms (fresh), paprika, pepper, tarragon, tomato, turmeric  Lamb Cloves, curry, dill, garlic or garlic powder, mace, mint, mint jelly, onion, oregano, parsley, pineapple, rosemary, tarragon, thyme  Pork Applesauce, basil, caraway, chives, cloves, garlic or garlic powder, onion or onion powder, rosemary, thyme  Veal Apricots, basil, bay leaf, currant jelly, curry, ginger, marjoram, mushrooms (fresh), oregano, paprika  Vegetables Basil, dill, garlic or garlic powder, ginger, lemon juice, mace, marjoram, nutmeg, onion or onion powder, tarragon, tomato, sugar or sugar substitute, salt-free salad  dressing, vinegar  Desserts Allspice, anise, cinnamon, cloves, ginger, mace, nutmeg, vanilla extract, other extracts   Copyright 2020  Academy of Nutrition and Dietetics. All rights reserved  Fluid Restricted Nutrition Therapy  You have been prescribed this diet because your condition affects how much fluid you can eat or drink. If your heart, liver, or kidneys aren't working properly, you may not be able to effectively eliminate fluids from the body and this may cause swelling (edema) in the legs, arms, and/or stomach. Drink no more than _________ liters or ________ ounces or ________cups of fluid per day.  . You don't need to stop eating or drinking the same fluids you normally would, but you may need to eat or drink less than usual.  . Your registered dietitian nutritionist will help you determine the correct amount of fluid to consume during the day Breakfast Include fluids taken with medications  Lunch Include fluids taken with medications  Dinner Include fluids taken with medications  Bedtime Snack Include fluids taken with medications     Tips What Are Fluids?  A fluid is anything that is liquid or anything that would melt if left at room temperature. You will need to count these foods and liquids--including any liquid used to take medication--as part of your daily fluid intake. Some examples are: . Alcohol (drink only with your doctor's permission)  . Coffee, tea, and other hot beverages  . Gelatin (Jell-O)  . Gravy  . Ice cream, sherbet, sorbet  . Ice cubes, ice chips  . Milk, liquid creamer  . Nutritional supplements  . Popsicles  . Vegetable and fruit juices; fluid in canned fruit  . Watermelon  . Yogurt  . Soft drinks, lemonade, limeade  . Soups  . Syrup How Do I Measure My Fluid Intake? Marland Kitchen Record your fluid intake daily.  . Tip: Every day, each time you eat or drink fluids, pour water in the same amount into an empty container that can hold the same amount of fluids  you are allowed daily. This may help you keep track of how much fluid you are taking in throughout the day.  . To accurately keep track of how much liquid you take in, measure the size of the cups, glasses, and bowls you use. If you eat soup, measure how much of it is liquid and how much is solid (such as noodles, vegetables,  meat). Conversions for Measuring Fluid Intake  Milliliters (mL) Liters (L) Ounces (oz) Cups (c)  1000 1 32 4  1200 1.2 40 5  1500 1.5 50 6 1/4  1800 1.8 60 7 1/2  2000 2 67 8 1/3  Tips to Reduce Your Thirst . Chew gum or suck on hard candy.  . Rinse or gargle with mouthwash. Do not swallow.  . Ice chips or popsicles my help quench thirst, but this too needs to be calculated into the total restriction. Melt ice chips or cubes first to figure out how much fluid they produce (for example, experiment with melting  cup ice chips or 2 ice cubes).  . Add a lemon wedge to your water.  . Limit how much salt you take in. A high salt intake might make you thirstier.  . Don't eat or drink all your allowed liquids at once. Space your liquids out through the day.  . Use small glasses and cups and sip slowly. If allowed, take your medications with fluids you eat or drink during a meal.   Fluid-Restricted Nutrition Therapy Sample 1-Day Menu  Breakfast 1 slice wheat toast  1 tablespoon peanut butter  1/2 cup yogurt (120 milliliters)  1/2 cup blueberries  1 cup milk (240 milliliters)   Lunch 3 ounces sliced [place]  2 slices whole wheat bread  1/2 cup lettuce for sandwich  2 slices tomato for sandwich  1 ounce reduced-fat, reduced-sodium cheese  1/2 cup fresh carrot sticks  1 banana  1 cup unsweetened tea (240 milliliters)   Evening Meal 8 ounces soup (240 milliliters)  3 ounces salmon  1/2 cup quinoa  1 cup green beans  1 cup mixed greens salad  1 tablespoon olive oil  1 cup coffee (240 milliliters)  Evening Snack 1/2 cup sliced peaches  1/2 cup frozen yogurt (120  milliliters)  1 cup water (240 milliliters)  Copyright 2020  Academy of Nutrition and Dietetics. All rights reserved

## 2021-01-23 NOTE — Progress Notes (Signed)
Mobility Specialist - Progress Note   01/23/21 1046  Mobility  Activity Ambulated in hall  Level of Assistance Independent  Assistive Device None  Distance Ambulated (ft) 400 ft  Mobility Ambulated with assistance in hallway  Mobility Response Tolerated well  Mobility performed by Mobility specialist  $Mobility charge 1 Mobility   Pt asx throughout ambulation. HR remained in 90s throughout and BP was 130/114 after walking. Pt in bed after walk, call bell at side.   Pricilla Handler Mobility Specialist Mobility Specialist Phone: [phone]

## 2021-01-23 NOTE — Progress Notes (Signed)
Pt is alert and oriented. Discharge instructions/ AVS given to pt. 

## 2021-01-23 NOTE — Discharge Summary (Signed)
Physician Discharge Summary  Gerald [name] [id number] DOB: 08-27-1980 DOA: 01/20/2021  PCP: Patient, [name] (Inactive)  Admit date: 01/20/2021 Discharge date: 01/23/2021  Admitted From: Home Disposition: Home  Recommendations for Outpatient Follow-up:  1. Follow up with PCP, scheduled with Dr. Thereasa Solo on 02/27/21 at 4:10 PM 2. Outpatient follow-up with cardiology scheduled on 01/29/2021 3. Please obtain BMP in one week 4. Continue to encourage tobacco and substance abuse cessation  Home Health: No Equipment/Devices: None  Discharge Condition: Stable CODE STATUS: Full code Diet recommendation: Heart healthy diet  History of present illness:  Gerald [name] is a 40 year old male with past medical history significant for paroxysmal atrial fibrillation on Xarelto, history of cocaine abuse, essential hypertension, chronic combined systolic/diastolic congestive heart failure, CKD stage IIIb, morbid obesity who presented to the ED with chest pain and hemoptysis.  Patient was noted to be hypoxic requiring 2 L of nasal cannula on arrival, febrile with chest x-ray notable for pneumonia and volume overload.  Cardiology was consulted and patient admitted to the hospital service for acute hypoxic respiratory failure secondary to combination of pneumonia and acute on chronic combined diastolic/systolic congestive heart failure exacerbation.  Hospital course:  Acute hypoxic respite failure, POA Patient presenting to ED with progressive shortness of breath and hemoptysis.  Patient not compliant with his home medications with recent cocaine abuse.  Chest x-ray concerning for volume overload and pneumonia.  Now titrated off of supplemental oxygen following treatment of pneumonia/CHF as below.  Continue treatment as below.  Community-acquired pneumonia Chest x-ray on arrival with progression of right perihilar and right lower lung opacity.  Completed course of azithromycin and ceftriaxone while  inpatient.  Acute on chronic combined systolic/diastolic congestive heart failure Patient presenting with progressive shortness of breath and hemoptysis, likely secondary to volume overload.  BNP elevated 2466.  Chest x-ray with findings consistent with pulmonary edema.  TTE with LVEF 30-35%, LV with global hypokinesis, LVH severe, LA severely dilated, RA mildly dilated, trivial MR, IVC dilated.  Started on Carvedilol 25 mg p.o. twice daily, Isosorbide mononitrate 30 mg p.o. daily, Entresto 24-26 mg p.o. twice daily, Spironolactone 25 mg p.o. daily and Furosemide 40 mg p.o. twice daily. Has outpatient follow-up scheduled in the heart failure clinic on 01/29/2021  CKD stage IIIb Baseline creatinine 2.0 to 2.2.  Creatinine 2.13 on discharge.  Repeat BMP 1 week.  Essential hypertension Carvedilol 25 mg p.o. twice daily, Isosorbide mononitrate 30 mg p.o. daily, Entresto 24-26 mg p.o. twice daily, Spironolactone 25 mg p.o. daily, Hydralazine 100 mg p.o. every 8 hours. Continue aspirin and statin  Paroxysmal atrial fibrillation Carvedilol 25 mg p.o. twice daily, Xarelto  Anemia of chronic renal disease Hemoglobin stable  Hypokalemia Repleted during hospitalization.  Continue Potassium chloride 10 mEq PO BID. BMP 1 week.  Obesity Body mass index is 38.15 kg/m.  Outpatient follow-up with PCP for obesity and lifestyle modification as this complicates all facets of care.  Polysubstance abuse UDS positive for THC.  Endorses previous cocaine abuse.  Counseled on need for complete cessation. Se[facility] by [facility] for substance abuse resources  Discharge Diagnoses:  Principal Problem:   Hypertensive crisis Active Problems:   Acute on chronic combined systolic and diastolic CHF (congestive heart failure) ([name])   Essential hypertension   PAF (paroxysmal atrial fibrillation) ([name])   CKD (chronic kidney disease), stage III ([name])   Polysubstance abuse ([name])   Class 2 obesity due to excess calories with  body mass index (BMI) of  39.0 to 39.9 in adult    Discharge Instructions  Discharge Instructions    (HEART FAILURE PATIENTS) Call MD:  Anytime you have any of the following symptoms: 1) 3 pound weight gain in 24 hours or 5 pounds in 1 week 2) shortness of breath, with or without a dry hacking cough 3) swelling in the hands, feet or stomach 4) if you have to sleep on extra pillows at night in order to breathe.   Complete by: As directed    Call MD for:  difficulty breathing, headache or visual disturbances   Complete by: As directed    Call MD for:  extreme fatigue   Complete by: As directed    Call MD for:  persistant dizziness or light-headedness   Complete by: As directed    Call MD for:  persistant nausea and vomiting   Complete by: As directed    Call MD for:  severe uncontrolled pain   Complete by: As directed    Call MD for:  temperature >100.4   Complete by: As directed    Diet - low sodium heart healthy   Complete by: As directed    Increase activity slowly   Complete by: As directed      Allergies as of 01/23/2021   No Known Allergies     Medication List    TAKE these medications   aspirin 81 MG EC tablet Take 1 tablet (81 mg total) by mouth daily. Swallow whole. Start taking on: January 24, 2021   atorvastatin 40 MG tablet Commonly known as: LIPITOR Take 1 tablet (40 mg total) by mouth daily.   carvedilol 25 MG tablet Commonly known as: COREG Take 1 tablet (25 mg total) by mouth 2 (two) times daily with a meal.   Entresto 24-26 MG Generic drug: sacubitril-valsartan Take 1 tablet by mouth 2 (two) times daily.   furosemide 40 MG tablet Commonly known as: LASIX Take 1 tablet (40 mg total) by mouth 2 (two) times daily. What changed:   medication strength  how much to take  when to take this   hydrALAZINE 100 MG tablet Commonly known as: APRESOLINE Take 1 tablet (100 mg total) by mouth every 8 (eight) hours. What changed: when to take this   isosorbide  mononitrate 30 MG 24 hr tablet Commonly known as: IMDUR Take 1 tablet (30 mg total) by mouth daily. Start taking on: January 24, 2021   nicotine 14 mg/24hr patch Commonly known as: NICODERM CQ - dosed in mg/24 hours Place 1 patch (14 mg total) onto the skin daily for 10 days. Start taking on: January 24, 2021   nicotine 7 mg/24hr patch Commonly known as: NICODERM CQ - dosed in mg/24 hr Place 1 patch (7 mg total) onto the skin daily for 21 days. Start taking on: February 03, 2021   potassium chloride 10 MEQ tablet Commonly known as: KLOR-CON Take 1 tablet (10 mEq total) by mouth 2 (two) times daily. What changed:   medication strength  how much to take  when to take this   rivaroxaban 20 MG Tabs tablet Commonly known as: XARELTO Take 1 tablet (20 mg total) by mouth daily with supper.   spironolactone 25 MG tablet Commonly known as: ALDACTONE Take 1 tablet (25 mg total) by mouth daily. Start taking on: January 24, 2021       Follow-up Information    [place]. Go in 1 month(s).   Why: New patient appointment scheduled  for 02/27/2021 at 4:10pm with Freeman Caldron, MD Contact information: [place] [phone]       [facility] Follow up on 01/29/2021.   Specialty: Cardiology Why: Please arrive 15 minutes early for your 2pm post-hospital cardiology appointment. Please use entrance C off [address] and you can utilize the [employer] parking.  Contact information: [address] [id number] [place] [phone]             No Known Allergies  Consultations:  Cardiology   Procedures/Studies: DG Chest 2 View  Result Date: 01/20/2021 CLINICAL DATA:  40 year old male with chest pain shortness of breath and hemoptysis for 3 days. EXAM: CHEST - 2 VIEW COMPARISON:  None. FINDINGS: Chest radiographs 12/21/2020 and  earlier.Chronic severe cardiomegaly. Stable lung volumes and mediastinal contours. Visualized tracheal air column is within normal limits. Widespread and confluent but indistinct peribronchial and lower lung opacity now on the right. No superimposed pneumothorax. Left lung ventilation appears stable with mild atelectasis. No acute osseous abnormality identified. Negative visible bowel gas pattern. IMPRESSION: Progressed and now widespread right perihilar and lower lung indistinct pulmonary opacity. Chronic severe cardiomegaly. In this setting top differential considerations include multilobar right pneumonia, pronounced asymmetric pulmonary edema, pulmonary vasculitis. No pleural effusion. Electronically Signed   By: Genevie Ann M.D.   On: 01/20/2021 07:39   ECHOCARDIOGRAM COMPLETE  Result Date: 01/20/2021    ECHOCARDIOGRAM REPORT   Patient Name:   Gerald [name] Date of Exam: 01/20/2021 Medical Rec #:  [id number]        Height:       71.0 in Accession #:    [id number]       Weight:       281.6 lb Date of Birth:  01/17/1981        BSA:          2.439 m Patient Age:    68 years         BP:           150/108 mmHg Patient Gender: M                HR:           95 bpm. Exam Location:  Inpatient Procedure: 2D Echo, Cardiac Doppler and Color Doppler Indications:    CHF-Acute Diastolic [id number] / [id number]  History:        Patient has prior history of Echocardiogram examinations, most                 recent 07/05/2020. Previous Myocardial Infarction,                 Arrythmias:Atrial Fibrillation and Atrial Flutter; Risk                 Factors:Hypertension. Polysubstance abuse. Arrhythmia. Class 2                 obesity. Acute systolic congestive heart failure ([place]).  Sonographer:    Alvino Chapel RCS Referring Phys: [place]  1. Left ventricular ejection fraction, by estimation, is 30 to 35%. The left ventricle has moderately decreased function. The left ventricle demonstrates global hypokinesis. The  left ventricular internal cavity size was mildly dilated. There is severe left ventricular hypertrophy. Left ventricular diastolic parameters are indeterminate.  2. Right ventricular systolic function is normal. The right ventricular size is normal. Moderately increased right ventricular wall  thickness. Tricuspid regurgitation signal is inadequate for assessing PA pressure.  3. Left atrial size was severely dilated.  4. Right atrial size was mildly dilated.  5. The mitral valve is normal in structure. Trivial mitral valve regurgitation.  6. The aortic valve is tricuspid. Aortic valve regurgitation is trivial. No aortic stenosis is present.  7. The inferior vena cava is dilated in size with >50% respiratory variability, suggesting right atrial pressure of 8 mmHg. FINDINGS  Left Ventricle: Left ventricular ejection fraction, by estimation, is 30 to 35%. The left ventricle has moderately decreased function. The left ventricle demonstrates global hypokinesis. The left ventricular internal cavity size was mildly dilated. There is severe left ventricular hypertrophy. Left ventricular diastolic parameters are indeterminate. Right Ventricle: The right ventricular size is normal. Moderately increased right ventricular wall thickness. Right ventricular systolic function is normal. Tricuspid regurgitation signal is inadequate for assessing PA pressure. Left Atrium: Left atrial size was severely dilated. Right Atrium: Right atrial size was mildly dilated. Pericardium: There is no evidence of pericardial effusion. Mitral Valve: The mitral valve is normal in structure. Trivial mitral valve regurgitation. Tricuspid Valve: The tricuspid valve is normal in structure. Tricuspid valve regurgitation is trivial. Aortic Valve: The aortic valve is tricuspid. Aortic valve regurgitation is trivial. No aortic stenosis is present. Pulmonic Valve: The pulmonic valve was not well visualized. Pulmonic valve regurgitation is trivial. Aorta: The  aortic root is normal in size and structure. Venous: The inferior vena cava is dilated in size with greater than 50% respiratory variability, suggesting right atrial pressure of 8 mmHg. IAS/Shunts: The interatrial septum was not well visualized.  LEFT VENTRICLE PLAX 2D LVIDd:         6.20 cm LVIDs:         5.20 cm LV PW:         2.30 cm LV IVS:        2.20 cm LVOT diam:     2.30 cm LV SV:         72 LV SV Index:   29 LVOT Area:     4.15 cm  RIGHT VENTRICLE TAPSE (M-mode): 2.6 cm LEFT ATRIUM              Index        RIGHT ATRIUM           Index LA diam:        5.90 cm  2.42 cm/m   RA Area:     25.50 cm LA Vol (A2C):   285.0 ml 116.87 ml/m RA Volume:   92.20 ml  37.81 ml/m LA Vol (A4C):   195.0 ml 79.96 ml/m LA Biplane Vol: 236.0 ml 96.78 ml/m  AORTIC VALVE LVOT Vmax:   113.33 cm/s LVOT Vmean:  81.167 cm/s LVOT VTI:    0.172 m  AORTA Ao Root diam: 3.90 cm MITRAL VALVE MV Area (PHT): 5.66 cm     SHUNTS MV Decel Time: 134 msec     Systemic VTI:  0.17 m MV E velocity: 104.00 cm/s  Systemic Diam: 2.30 cm Oswaldo Milian MD Electronically signed by Oswaldo Milian MD Signature Date/Time: 01/20/2021/5:20:01 PM    Final      Subjective: Patient seen examined at bedside, resting comfortably.  No complaints this morning.  States he is feeling extremely well today.  Appreciative all the assistance from staff and providers.  Per cardiology okay for discharge home with outpatient follow-up already scheduled.  Patient denies headache, no dizziness, no chest pain, no palpitations,  no shortness of breath, no fever/chills/night sweats, no nausea/vomiting/diarrhea, no abdominal pain, no weakness, no fatigue, no paresthesias.  No acute events overnight per nursing staff.  Discharge Exam: Vitals:   01/23/21 0522 01/23/21 0808  BP: (!) 150/110 (!) 167/119  Pulse: 89 91  Resp: 17 18  Temp: 97.9 F (36.6 C) (!) 97.2 F (36.2 C)  SpO2: 95% 98%   Vitals:   01/22/21 2101 01/22/21 2102 01/23/21 0522 01/23/21  0808  BP: 123/88 123/88 (!) 150/110 (!) 167/119  Pulse:  88 89 91  Resp:  '17 17 18  '$ Temp:  97.8 F (36.6 C) 97.9 F (36.6 C) (!) 97.2 F (36.2 C)  TempSrc:  Oral Oral Axillary  SpO2:  98% 95% 98%  Weight:   124.9 kg     General: Pt is alert, awake, not in acute distress, obese Cardiovascular: RRR, S1/S2 +, no rubs, no gallops Respiratory: CTA bilaterally, no wheezing, no rhonchi, on room air Abdominal: Soft, NT, ND, bowel sounds + Extremities: no edema, no cyanosis    The results of significant diagnostics from this hospitalization (including imaging, microbiology, ancillary and laboratory) are listed below for reference.     Microbiology: Recent Results (from the past 240 hour(s))  Resp Panel by RT-PCR (Flu A&B, Covid) Nasopharyngeal Swab     Status: None   Collection Time: 01/20/21  8:37 AM   Specimen: Nasopharyngeal Swab; Nasopharyngeal(NP) swabs in vial transport medium  Result Value Ref Range Status   SARS Coronavirus 2 by RT PCR NEGATIVE NEGATIVE Final    Comment: (NOTE) SARS-CoV-2 target nucleic acids are NOT DETECTED.  The SARS-CoV-2 RNA is generally detectable in upper respiratory specimens during the acute phase of infection. The lowest concentration of SARS-CoV-2 viral copies this assay can detect is 138 copies/mL. A negative result does not preclude SARS-Cov-2 infection and should not be used as the sole basis for treatment or other patient management decisions. A negative result may occur with  improper specimen collection/handling, submission of specimen other than nasopharyngeal swab, presence of viral mutation(s) within the areas targeted by this assay, and inadequate number of viral copies(<138 copies/mL). A negative result must be combined with clinical observations, patient history, and epidemiological information. The expected result is Negative.  Fact Sheet for Patients:  EntrepreneurPulse.com.au  Fact Sheet for Healthcare  Providers:  IncredibleEmployment.be  This test is no t yet approved or cleared by the [place] FDA and  has been authorized for detection and/or diagnosis of SARS-CoV-2 by FDA under an Emergency Use Authorization (EUA). This EUA will remain  in effect (meaning this test can be used) for the duration of the COVID-19 declaration under Section 564(b)(1) of the Act, 21 U.S.C.section 360bbb-3(b)(1), unless the authorization is terminated  or revoked sooner.       Influenza A by PCR NEGATIVE NEGATIVE Final   Influenza B by PCR NEGATIVE NEGATIVE Final    Comment: (NOTE) The Xpert Xpress SARS-CoV-2/FLU/RSV plus assay is intended as an aid in the diagnosis of influenza from Nasopharyngeal swab specimens and should not be used as a sole basis for treatment. Nasal washings and aspirates are unacceptable for Xpert Xpress SARS-CoV-2/FLU/RSV testing.  Fact Sheet for Patients: EntrepreneurPulse.com.au  Fact Sheet for Healthcare Providers: IncredibleEmployment.be  This test is not yet approved or cleared by the [place] FDA and has been authorized for detection and/or diagnosis of SARS-CoV-2 by FDA under an Emergency Use Authorization (EUA). This EUA will remain in effect (meaning this test can be used) for the  duration of the COVID-19 declaration under Section 564(b)(1) of the Act, 21 U.S.C. section 360bbb-3(b)(1), unless the authorization is terminated or revoked.  Performed at [facility], [place] [address], [place], [place] [address]   Culture, blood (x 2)     Status: None (Preliminary result)   Collection Time: 01/20/21  4:22 PM   Specimen: BLOOD  Result Value Ref Range Status   Specimen Description BLOOD RIGHT ANTECUBITAL  Final   Special Requests   Final    BOTTLES DRAWN AEROBIC ONLY Blood Culture adequate volume   Culture   Final    NO GROWTH 3 DAYS Performed at [facility], [place] [address]  [place], [place] [address]    Report Status PENDING  Incomplete  Culture, blood (x 2)     Status: None (Preliminary result)   Collection Time: 01/20/21  4:28 PM   Specimen: BLOOD  Result Value Ref Range Status   Specimen Description BLOOD LEFT ANTECUBITAL  Final   Special Requests   Final    BOTTLES DRAWN AEROBIC AND ANAEROBIC Blood Culture adequate volume   Culture   Final    NO GROWTH 3 DAYS Performed at [facility], [place] [address] [place], [place] [address]    Report Status PENDING  Incomplete     Labs: BNP (last 3 results) Recent Labs    07/05/20 0204 12/21/20 0722 01/20/21 0837  BNP 1,066.8* 2,309.0* 123XX123*   Basic Metabolic Panel: Recent Labs  Lab 01/20/21 0708 01/20/21 0838 01/21/21 0456 01/21/21 1610 01/23/21 0348  NA 136  --  137 139 137  K 2.7*  --  2.8* 3.4* 3.8  CL 105  --  105 106 105  CO2 22  --  '22 25 25  '$ GLUCOSE 107*  --  111* 99 118*  BUN 22*  --  23* 25* 28*  CREATININE 1.92*  --  2.02* 2.10* 2.13*  CALCIUM 9.0  --  8.3* 8.5* 9.0  MG  --  2.0  --   --  2.0   Liver Function Tests: No results for input(s): AST, ALT, ALKPHOS, BILITOT, PROT, ALBUMIN in the last 168 hours. No results for input(s): LIPASE, AMYLASE in the last 168 hours. No results for input(s): AMMONIA in the last 168 hours. CBC: Recent Labs  Lab 01/20/21 0708 01/21/21 0456  WBC 8.4 8.6  HGB 11.4* 9.9*  HCT 37.1* 32.6*  MCV 68.8* 69.1*  PLT 208 200   Cardiac Enzymes: No results for input(s): CKTOTAL, CKMB, CKMBINDEX, TROPONINI in the last 168 hours. BNP: Invalid input(s): POCBNP CBG: No results for input(s): GLUCAP in the last 168 hours. D-Dimer No results for input(s): DDIMER in the last 72 hours. Hgb A1c No results for input(s): HGBA1C in the last 72 hours. Lipid Profile Recent Labs    01/20/21 1622  CHOL 163  HDL 44  LDLCALC 107*  TRIG 61  CHOLHDL 3.7   Thyroid function studies No results for input(s): TSH, T4TOTAL, T3FREE, THYROIDAB in the last 72  hours.  Invalid input(s): FREET3 Anemia work up No results for input(s): VITAMINB12, FOLATE, FERRITIN, TIBC, IRON, RETICCTPCT in the last 72 hours. Urinalysis No results found for: COLORURINE, APPEARANCEUR, LABSPEC, [place], GLUCOSEU, [place], [place], [place], PROTEINUR, UROBILINOGEN, NITRITE, LEUKOCYTESUR Sepsis Labs Invalid input(s): PROCALCITONIN,  WBC,  LACTICIDVEN Microbiology Recent Results (from the past 240 hour(s))  Resp Panel by RT-PCR (Flu A&B, Covid) Nasopharyngeal Swab     Status: None   Collection Time: 01/20/21  8:37 AM  Specimen: Nasopharyngeal Swab; Nasopharyngeal(NP) swabs in vial transport medium  Result Value Ref Range Status   SARS Coronavirus 2 by RT PCR NEGATIVE NEGATIVE Final    Comment: (NOTE) SARS-CoV-2 target nucleic acids are NOT DETECTED.  The SARS-CoV-2 RNA is generally detectable in upper respiratory specimens during the acute phase of infection. The lowest concentration of SARS-CoV-2 viral copies this assay can detect is 138 copies/mL. A negative result does not preclude SARS-Cov-2 infection and should not be used as the sole basis for treatment or other patient management decisions. A negative result may occur with  improper specimen collection/handling, submission of specimen other than nasopharyngeal swab, presence of viral mutation(s) within the areas targeted by this assay, and inadequate number of viral copies(<138 copies/mL). A negative result must be combined with clinical observations, patient history, and epidemiological information. The expected result is Negative.  Fact Sheet for Patients:  EntrepreneurPulse.com.au  Fact Sheet for Healthcare Providers:  IncredibleEmployment.be  This test is no t yet approved or cleared by the [place] FDA and  has been authorized for detection and/or diagnosis of SARS-CoV-2 by FDA under an Emergency Use Authorization (EUA). This EUA will remain  in  effect (meaning this test can be used) for the duration of the COVID-19 declaration under Section 564(b)(1) of the Act, 21 U.S.C.section 360bbb-3(b)(1), unless the authorization is terminated  or revoked sooner.       Influenza A by PCR NEGATIVE NEGATIVE Final   Influenza B by PCR NEGATIVE NEGATIVE Final    Comment: (NOTE) The Xpert Xpress SARS-CoV-2/FLU/RSV plus assay is intended as an aid in the diagnosis of influenza from Nasopharyngeal swab specimens and should not be used as a sole basis for treatment. Nasal washings and aspirates are unacceptable for Xpert Xpress SARS-CoV-2/FLU/RSV testing.  Fact Sheet for Patients: EntrepreneurPulse.com.au  Fact Sheet for Healthcare Providers: IncredibleEmployment.be  This test is not yet approved or cleared by the [place] FDA and has been authorized for detection and/or diagnosis of SARS-CoV-2 by FDA under an Emergency Use Authorization (EUA). This EUA will remain in effect (meaning this test can be used) for the duration of the COVID-19 declaration under Section 564(b)(1) of the Act, 21 U.S.C. section 360bbb-3(b)(1), unless the authorization is terminated or revoked.  Performed at [facility], [place] [address] [place], [place] [address]   Culture, blood (x 2)     Status: None (Preliminary result)   Collection Time: 01/20/21  4:22 PM   Specimen: BLOOD  Result Value Ref Range Status   Specimen Description BLOOD RIGHT ANTECUBITAL  Final   Special Requests   Final    BOTTLES DRAWN AEROBIC ONLY Blood Culture adequate volume   Culture   Final    NO GROWTH 3 DAYS Performed at [facility], [place] [address] [place], [place] [address]    Report Status PENDING  Incomplete  Culture, blood (x 2)     Status: None (Preliminary result)   Collection Time: 01/20/21  4:28 PM   Specimen: BLOOD  Result Value Ref Range Status   Specimen Description BLOOD LEFT ANTECUBITAL  Final   Special  Requests   Final    BOTTLES DRAWN AEROBIC AND ANAEROBIC Blood Culture adequate volume   Culture   Final    NO GROWTH 3 DAYS Performed at [facility], [place] [address], [place],  [address]    Report Status PENDING  Incomplete     Time coordinating discharge: Over 30 minutes  SIGNED:  Kimyah Frein J [place] DO  [facility] 01/23/2021, 11:29 AM

## 2021-01-24 NOTE — Progress Notes (Signed)
[facility] Transitions [facility]  PCP: none Primary Cardiologist: Ida Rogue  HPI:  Gerald [name] is a 40 y.o.  male  with a PMH significant for HFrEF/NICM (EF 30-35% by echo in 06/2020 and cath in 12/2020 showing normal cors --> cardiomyopathy felt to be secondary to uncontrolled HTN), HTN, Stage 3b CKD, paroxysmal atrial fibrillation, tobacco use, cocaine use, poor medical/medication adherence  Cardiac history began 12/21/2015.  Patient presented to [facility] with chest pain, severe HTN systolic bp ~ [id number] in the setting of cocaine use.  Troponin 0.15, 0.16 , EKG with LVH and T wave inversions, low suspicion for ACS.  Symptoms improved with ntg.  Had an ECHO with EF 60%, Severe LVH, moderate RVH.  04/01/2019 admitted again off all medications and severely hypertensive with systolic bp [id number], this time was in aflutter with RVR.  EF 30-35% while in flutter, underwent TEE cardioversion and started on xarelto.   06/2020 admitted with hypertensive emergency, afib, acute on chronic combined chf.  GDMT restarted.  ECHO repeated with EF still 30-35%, severe LVH, G2DD.  Also had Renal duplex which was negative for RAS.    Similar admission 12/2020 not taking his meds, in hypertensive emergency, troponin was 2000, in NSR but with new LBBB, in acute decompensated combined chf.  Taken for [facility] which demonstrated normal coronaries, elevated LVEDP.  Diuresed 285>281lbs, GDMT restarted, entresto added.    One month laster 01/2021 admitted with acute decompensated combined chf.  Only taking entresto.  Had a repeat echo with EF that remained at 30-35%, severe LVH.  Discharged on entresto 24/26, spiro 25, carvedilol 25 BID, hydralazine 100 TID, IMDUR 30, lasix 40 BID. Admission wt trend unreliable  Upset this afternoon, arguing with his girlfriend she is upset with him that he's still smoking.  Smoked a cigarette before coming in, has cut back to 3-4 cigs per day from 1ppd.  Feeling well  overall, breathing is doing well, sleeping better.  Played basketball Saturday and felt good afterwards.  Reports improved exercise capacity.  Denies chest pain, orthopnea or PND.  Weighing himself at home says it has been stable at 280-282lbs.      ROS: All systems negative except as listed in HPI, PMH and Problem List.  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    Drug use: Yes    Types: Cocaine, Marijuana    Comment: uses marijuana occasionally, reports last use of cocaine 1 month ago as of 01/20/21   Sexual activity: Not on file  Other Topics Concern   Not on file  Social History Narrative   Not on file   Social Determinants of Health   Financial Resource Strain: Medium Risk   Difficulty of Paying Living Expenses: Somewhat hard  Food Insecurity: No Food Insecurity  Worried About Charity fundraiser in the Last Year: Never true   [place] in the Last Year: Never true  Transportation Needs: No Transportation Needs   Lack of Transportation (Medical): No   Lack of Transportation (Non-Medical): No  Physical Activity: Not on file  Stress: Not on file  Social Connections: Not on file  Intimate Partner Violence: Not on file    FH:  Family History  Problem Relation Age of Onset   Hypertension Father    Heart disease Paternal Grandfather     Past Medical History:  Diagnosis Date   Arrhythmia    CHF (congestive heart failure) ([name])    Dysrhythmia     brief episode afib, cardioverted did not return   Hypertension    Polysubstance abuse ([name])     Current Outpatient Medications  Medication Sig Dispense Refill   aspirin 81 MG EC tablet Take 1 tablet (81 mg total) by mouth daily. Swallow whole. 30 tablet 2   atorvastatin (LIPITOR) 40 MG tablet Take 1 tablet (40 mg total) by mouth daily. 30 tablet 2   carvedilol (COREG) 25 MG tablet Take 1 tablet (25 mg total) by mouth 2 (two) times daily with a meal. 60 tablet 2   furosemide (LASIX) 40 MG tablet Take 1 tablet (40 mg total) by mouth 2 (two) times daily. 60 tablet 2   hydrALAZINE (APRESOLINE) 100 MG tablet Take 1 tablet (100 mg total) by mouth every 8 (eight) hours. 90 tablet 2   isosorbide mononitrate (IMDUR) 30 MG 24 hr tablet Take 1 tablet (30 mg total) by mouth daily. 30 tablet 2   potassium chloride (KLOR-CON) 10 MEQ tablet Take 1 tablet (10 mEq total) by mouth 2 (two) times daily. 60 tablet 2   rivaroxaban (XARELTO) 20 MG TABS tablet Take 1 tablet (20 mg total) by mouth daily with supper. 30 tablet 2   sacubitril-valsartan (ENTRESTO) 24-26 MG Take 1 tablet by mouth 2 (two) times daily. 60 tablet 2   spironolactone (ALDACTONE) 25 MG tablet Take 1 tablet (25 mg total) by mouth daily. 30 tablet 2   No current facility-administered medications for this encounter.    Vitals:   01/29/21 1414  BP: (!) 200/110  Pulse: 87  SpO2: 99%  Weight: 128.5 kg (283 lb 6 oz)    PHYSICAL EXAM: Cardiac: JVD flat, normal rate and rhythm, clear s1 and s2, no murmurs, rubs or gallops, trace LE edema Pulmonary: CTAB, not in distress Abdominal: non distended abdomen, soft and nontender Psych: Alert, conversant, in good spirits   ASSESSMENT & PLAN: Chronic Combined Systolic and Diastolic CHF: -NICM in the setting of long standing uncontrolled severe HTN, poor medication adherence, cocaine use disorder,  -LBBB with QRS 159m -EF 60% in 2017 but severe LVH and moderate RVH on echo -2020 EF 30-35%   -LHC 12/2018 with normal coronaries -last ECHO 01/2021 with EF 30-35%, severe LVH -NYHA Class [name] symptoms, euvolemic/mildly volume up on exam -currently taking Entresto 24/26, spiro 25, carvedilol 25 BID, hydralazine 100 TID, IMDUR 30, lasix 40 BID, 129m K BID -increase entresto to 49/51, decrease lasix to '60mg'$  daily, add farxiga '10mg'$ , decrease K to 1052mdaily  HTN: -Remains hypertensive did have an argument and smoking before arrival, denies cocaine use, has no symptoms -increase entresto to 49/51  CKD3a: -repeat bmp today  Afib/Aflutter: -regular on exam today -continue xarelto, carvedilol   Tobacco Use disorder: -continues to decrease down from 1ppd to 3-5cigs per day  Cocaine  Use Disorder: -says he has not used cocaine since hospitalization and will not use again   Follow up with general cardiology

## 2021-01-25 LAB — CULTURE, BLOOD (ROUTINE X 2)
Culture: NO GROWTH
Culture: NO GROWTH
Special Requests: ADEQUATE
Special Requests: ADEQUATE

## 2021-01-28 ENCOUNTER — Telehealth (HOSPITAL_COMMUNITY): Payer: Self-pay | Admitting: Licensed Clinical Social Worker

## 2021-01-28 NOTE — Telephone Encounter (Signed)
CSW contacted patient to remind of [facility] appointment tomorrow. Message left.  Raquel Sarna, [place], [place]

## 2021-01-29 ENCOUNTER — Other Ambulatory Visit: Payer: Self-pay

## 2021-01-29 ENCOUNTER — Ambulatory Visit (HOSPITAL_COMMUNITY)
Admit: 2021-01-29 | Discharge: 2021-01-29 | Disposition: A | Discharge status: Home / Self Care | Payer: Self-pay | Attending: Internal Medicine | Admitting: Internal Medicine

## 2021-01-29 ENCOUNTER — Encounter (HOSPITAL_COMMUNITY): Payer: Self-pay

## 2021-01-29 VITALS — BP 200/110 | HR 87 | Wt 283.4 lb

## 2021-01-29 DIAGNOSIS — I10 Essential (primary) hypertension: Secondary | ICD-10-CM

## 2021-01-29 DIAGNOSIS — I447 Left bundle-branch block, unspecified: Secondary | ICD-10-CM | POA: Insufficient documentation

## 2021-01-29 DIAGNOSIS — Z596 Low income: Secondary | ICD-10-CM | POA: Insufficient documentation

## 2021-01-29 DIAGNOSIS — F1420 Cocaine dependence, uncomplicated: Secondary | ICD-10-CM | POA: Insufficient documentation

## 2021-01-29 DIAGNOSIS — I5043 Acute on chronic combined systolic (congestive) and diastolic (congestive) heart failure: Secondary | ICD-10-CM | POA: Insufficient documentation

## 2021-01-29 DIAGNOSIS — I5042 Chronic combined systolic (congestive) and diastolic (congestive) heart failure: Secondary | ICD-10-CM

## 2021-01-29 DIAGNOSIS — Z7982 Long term (current) use of aspirin: Secondary | ICD-10-CM | POA: Insufficient documentation

## 2021-01-29 DIAGNOSIS — F17210 Nicotine dependence, cigarettes, uncomplicated: Secondary | ICD-10-CM | POA: Insufficient documentation

## 2021-01-29 DIAGNOSIS — Z79899 Other long term (current) drug therapy: Secondary | ICD-10-CM | POA: Insufficient documentation

## 2021-01-29 DIAGNOSIS — I480 Paroxysmal atrial fibrillation: Secondary | ICD-10-CM | POA: Insufficient documentation

## 2021-01-29 DIAGNOSIS — I428 Other cardiomyopathies: Secondary | ICD-10-CM | POA: Insufficient documentation

## 2021-01-29 DIAGNOSIS — F1410 Cocaine abuse, uncomplicated: Secondary | ICD-10-CM

## 2021-01-29 DIAGNOSIS — Z720 Tobacco use: Secondary | ICD-10-CM

## 2021-01-29 DIAGNOSIS — I130 Hypertensive heart and chronic kidney disease with heart failure and stage 1 through stage 4 chronic kidney disease, or unspecified chronic kidney disease: Secondary | ICD-10-CM | POA: Insufficient documentation

## 2021-01-29 DIAGNOSIS — I5022 Chronic systolic (congestive) heart failure: Secondary | ICD-10-CM

## 2021-01-29 DIAGNOSIS — N1832 Chronic kidney disease, stage 3b: Secondary | ICD-10-CM | POA: Insufficient documentation

## 2021-01-29 DIAGNOSIS — Z8249 Family history of ischemic heart disease and other diseases of the circulatory system: Secondary | ICD-10-CM | POA: Insufficient documentation

## 2021-01-29 DIAGNOSIS — Z7901 Long term (current) use of anticoagulants: Secondary | ICD-10-CM | POA: Insufficient documentation

## 2021-01-29 MED ORDER — DAPAGLIFLOZIN PROPANEDIOL 10 MG PO TABS: 10.0000 mg | ORAL_TABLET | Freq: Every day | ORAL | 0 refills | Status: DC

## 2021-01-29 MED ORDER — DAPAGLIFLOZIN PROPANEDIOL 10 MG PO TABS
10.0000 mg | ORAL_TABLET | Freq: Every day | ORAL | 0 refills | Status: DC
Start: 2021-01-29 — End: 2021-04-02

## 2021-01-29 MED ORDER — POTASSIUM CHLORIDE CRYS ER 10 MEQ PO TBCR: 10.0000 meq | EXTENDED_RELEASE_TABLET | Freq: Every day | ORAL | 2 refills | Status: DC

## 2021-01-29 MED ORDER — ENTRESTO 49-51 MG PO TABS: 1.0000 | ORAL_TABLET | Freq: Two times a day (BID) | ORAL | 3 refills | Status: DC

## 2021-01-29 MED ORDER — FUROSEMIDE 40 MG PO TABS: 60.0000 mg | ORAL_TABLET | Freq: Every day | ORAL | 2 refills | Status: DC

## 2021-01-29 NOTE — Progress Notes (Signed)
Heart and Vascular Care Navigation  01/29/2021  Gerald [name] Dec 29, 1980 [id number]  Reason for Referral: Patient seen in HF TOC.    Engaged with patient face to face for initial visit for Heart and Vascular Care Coordination.                                                                                                   Assessment: Patient is a 40 yo single male who reports he has 3 children (teenagers) who reside with grandmother.  Patient states he works at [employer] and currently has no insurance. Patient shared that his employer does not offer insurance and his thinks he has a pending medicaid. Patient lives at the [facility] and spoke about some challenges with his diet and exercise due to work responsibilities.  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 Patient feels like he is in the same routine which is not helping to improve his health.  Patient acknowledged he uses cocaine but has not since he was hospitalized.  CSW discussed resources for substance use although patient states "I can stop using when ever I want". He states he has cut down on daily nicotine use and attempting to quit. He shared challenges with his diet and know having the nutritional understanding of heart healthy.                              HRT/VAS Care Coordination     Patients Home Cardiology Office --  [facility]   Outpatient Care Team Social Worker   Social Worker Name: Raquel Sarna, Marlinda Mike [phone]   Living arrangements for the past 2 months Group Home  Employee at the [place] with: Other (Comment)  [place] Resident Employee   Patient Current Insurance Coverage Self-Pay   Patient Has Concern With Paying Medical Bills Yes   Medical Bill Referrals: [facility] Financial Assistance   Does Patient Have Prescription Coverage? No   Patient Prescription Assistance Programs Heart Failure [facility] Assistive Devices/Equipment Scales       Social History:                                                                             SDOH Screenings   Alcohol Screen: Low Risk    Last Alcohol Screening Score (AUDIT): 0  Depression (PHQ2-9): Not on file  Financial Resource Strain: Medium Risk   Difficulty of Paying Living Expenses: Somewhat hard  Food Insecurity: No Food Insecurity   Worried About Running Out of Food in the Last Year: Never true   Ran Out of Food in  the Last Year: Never true  Housing: Low Risk    Last Housing Risk Score: 0  Physical Activity: Not on file  Social Connections: Not on file  Stress: Not on file  Tobacco Use: High Risk   Smoking Tobacco Use: Every Day   Smokeless Tobacco Use: Never  Transportation Needs: No Transportation Needs   Lack of Transportation (Medical): No   Lack of Transportation (Non-Medical): No    SDOH Interventions: Financial Resources:  Sales promotion account executive Interventions: Conservation officer, nature for [place] Insecurity:  Food Insecurity Interventions: Intervention Not Indicated  Housing Insecurity:   N/a  Transportation:    N/a   Health Promotion Interventions:     Physical Inactivity Discussed exercise daily  Smoking Cessation Discussed some coping techniques and Health Coaching  Dietary Concerns Provided HF Booklet with nutritional information  Health Coaching Patient referred to Care Guide for health coaching regarding smoking cessation   Other Care Navigation Interventions:     Inpatient/Outpatient Substance Abuse Counseling/Rehab Options Patient denied need  Provided Pharmacy assistance resources Heart Failure Fund  Patient expressed Mental Health concerns No.  Patient Referred to: N/a   Follow-up plan:  CSW provided resources for diet/nutritional information and a scale. Patient denies need for substance use resources although was open to Health Coaching for smoking  cessation. CSW discussed CAFA application and needed items to return on next HF clinic visit. Patient verbalizes understanding and will follow up with CSW on next visit. Raquel Sarna, [place], [place]

## 2021-01-29 NOTE — Patient Instructions (Signed)
Start Farxiga 10 mg Daily  Increase Entresto to 49/51 mg Twice daily   Decrease Furosemide to 60 mg (1 & 1/2 tabs) Daily  Decrease Potassium to 10 meq Daily  Thank you for allowing [place] to provider your heart failure care after your recent hospitalization. Please follow-up with [place] in 2 weeks and [facility] in 1-2 months  If you have any questions, issues, or concerns before your next appointment please call our office at [phone], opt. 2 and leave a message for the triage nurse.  Do the following things EVERYDAY: Weigh yourself in the morning before breakfast. Write it down and keep it in a log. Take your medicines as prescribed Eat low salt foods--Limit salt (sodium) to 2000 mg per day.  Stay as active as you can everyday Limit all fluids for the day to less than 2 liters  Weigh yourself EVERY morning after you go to the bathroom but before you eat or drink anything. Write this number down in a weight log/diary. If you gain 3 pounds overnight or 5 pounds in a week, call the heart failure clinic  Restrict your sodium intake to less than '2000mg'$  per day. This will help prevent your body from holding onto fluid. Read food labels as a lot of canned and packaged foods have a lot of sodium.  Limit your fluid intake to less than 2 liters of fluid per day. Fluid includes all drinks, coffee, juice, ice chips, soup, jello, and all other liquids.

## 2021-01-30 ENCOUNTER — Encounter (HOSPITAL_COMMUNITY): Payer: Self-pay

## 2021-01-31 ENCOUNTER — Telehealth: Payer: Self-pay

## 2021-01-31 DIAGNOSIS — Z0000 Encounter for general adult medical examination without abnormal findings: Secondary | ICD-10-CM

## 2021-01-31 NOTE — Telephone Encounter (Signed)
Called patient to inquire about his interest regarding health coaching for smoking cessation. Left message for patient to return call to Care Guide at [phone] for further details.

## 2021-02-01 ENCOUNTER — Other Ambulatory Visit (HOSPITAL_COMMUNITY): Payer: Self-pay

## 2021-02-04 ENCOUNTER — Other Ambulatory Visit (HOSPITAL_COMMUNITY): Payer: Self-pay

## 2021-02-04 ENCOUNTER — Telehealth (HOSPITAL_COMMUNITY): Payer: Self-pay

## 2021-02-04 NOTE — Telephone Encounter (Signed)
Transitions of Care Pharmacy  ° °Call attempted for a pharmacy transitions of care follow-up. HIPAA appropriate voicemail was left with call back information provided.  ° °Call attempt #1. Will follow-up in 2-3 days.  °  °

## 2021-02-06 ENCOUNTER — Telehealth (HOSPITAL_COMMUNITY): Payer: Self-pay

## 2021-02-06 NOTE — Telephone Encounter (Signed)
Transitions of Care Pharmacy   Call attempted for a pharmacy transitions of care follow-up. HIPAA appropriate voicemail was left with call back information provided.   Call attempt #2. Will follow-up in 2-3 days.    

## 2021-02-12 ENCOUNTER — Telehealth (HOSPITAL_COMMUNITY): Payer: Self-pay | Admitting: Surgery

## 2021-02-12 NOTE — Telephone Encounter (Signed)
I called patient to remind him of scheduled appt in the [facility] for tomorrow.  He acknowledges appt and plans to attend.

## 2021-02-13 ENCOUNTER — Telehealth (HOSPITAL_COMMUNITY): Payer: Self-pay | Admitting: Surgery

## 2021-02-13 ENCOUNTER — Ambulatory Visit (HOSPITAL_COMMUNITY): Payer: Self-pay

## 2021-02-13 ENCOUNTER — Telehealth (HOSPITAL_COMMUNITY): Payer: Self-pay

## 2021-02-13 NOTE — Telephone Encounter (Signed)
Transitions of Care Pharmacy   Call attempted for a pharmacy transitions of care follow-up. HIPAA appropriate voicemail was left with call back information provided.   Call attempt #3. Will no longer attempt follow up calls for [facility] pharmacy.

## 2021-02-13 NOTE — Telephone Encounter (Signed)
Gerald [name] called and asked that I reschedule his appt for today.  I have moved the appt to next week on July 7th.

## 2021-02-20 ENCOUNTER — Telehealth (HOSPITAL_COMMUNITY): Payer: Self-pay | Admitting: Surgery

## 2021-02-20 NOTE — Telephone Encounter (Signed)
Patient called to remind him of the schedule [facility] Appt tomorrow.  I left a message with time and date of the call.

## 2021-02-21 ENCOUNTER — Telehealth (HOSPITAL_COMMUNITY): Payer: Self-pay | Admitting: Surgery

## 2021-02-21 ENCOUNTER — Ambulatory Visit (HOSPITAL_COMMUNITY): Payer: Self-pay

## 2021-02-21 NOTE — Telephone Encounter (Signed)
Gerald [name] called and requested that I move his appt at 2pm today in the HF [facility] clinic to another date because he was unable to make today's scheduled appt.  I have rescheduled the appt to July 19 per his request.

## 2021-02-22 NOTE — Addendum Note (Signed)
Encounter addended by: Katherine Roan, MD on: 02/22/2021 3:58 PM  Actions taken: Order list changed

## 2021-02-25 ENCOUNTER — Other Ambulatory Visit (HOSPITAL_COMMUNITY): Payer: Self-pay | Admitting: Internal Medicine

## 2021-02-27 ENCOUNTER — Inpatient Hospital Stay: Payer: Self-pay | Admitting: Physician Assistant

## 2021-02-27 NOTE — Progress Notes (Deleted)
Patient ID: Gerald [name], male   DOB: Jul 30, 1981, 40 y.o.   MRN: [id number]   After hospitalization 6/5-01/23/2021.  Seen by heart and vascular transition of care 01/29/2021  From discharge summary: History of present illness:   Gerald [name] is a 40 year old male with past medical history significant for paroxysmal atrial fibrillation on Xarelto, history of cocaine abuse, essential hypertension, chronic combined systolic/diastolic congestive heart failure, CKD stage IIIb, morbid obesity who presented to the ED with chest pain and hemoptysis.  Patient was noted to be hypoxic requiring 2 L of nasal cannula on arrival, febrile with chest x-ray notable for pneumonia and volume overload.  Cardiology was consulted and patient admitted to the hospital service for acute hypoxic respiratory failure secondary to combination of pneumonia and acute on chronic combined diastolic/systolic congestive heart failure exacerbation.   Hospital course:   Acute hypoxic respite failure, POA Patient presenting to ED with progressive shortness of breath and hemoptysis.  Patient not compliant with his home medications with recent cocaine abuse.  Chest x-ray concerning for volume overload and pneumonia.  Now titrated off of supplemental oxygen following treatment of pneumonia/CHF as below.  Continue treatment as below.   Community-acquired pneumonia Chest x-ray on arrival with progression of right perihilar and right lower lung opacity.  Completed course of azithromycin and ceftriaxone while inpatient.   Acute on chronic combined systolic/diastolic congestive heart failure Patient presenting with progressive shortness of breath and hemoptysis, likely secondary to volume overload.  BNP elevated 2466.  Chest x-ray with findings consistent with pulmonary edema.  TTE with LVEF 30-35%, LV with global hypokinesis, LVH severe, LA severely dilated, RA mildly dilated, trivial MR, IVC dilated.  Started on Carvedilol 25 mg p.o.  twice daily, Isosorbide mononitrate 30 mg p.o. daily, Entresto 24-26 mg p.o. twice daily, Spironolactone 25 mg p.o. daily and Furosemide 40 mg p.o. twice daily. Has outpatient follow-up scheduled in the heart failure clinic on 01/29/2021   CKD stage IIIb Baseline creatinine 2.0 to 2.2.  Creatinine 2.13 on discharge.  Repeat BMP 1 week.   Essential hypertension Carvedilol 25 mg p.o. twice daily, Isosorbide mononitrate 30 mg p.o. daily, Entresto 24-26 mg p.o. twice daily, Spironolactone 25 mg p.o. daily, Hydralazine 100 mg p.o. every 8 hours. Continue aspirin and statin   Paroxysmal atrial fibrillation Carvedilol 25 mg p.o. twice daily, Xarelto   Anemia of chronic renal disease Hemoglobin stable   Hypokalemia Repleted during hospitalization.  Continue Potassium chloride 10 mEq PO BID. BMP 1 week.   Obesity Body mass index is 38.15 kg/m.  Outpatient follow-up with PCP for obesity and lifestyle modification as this complicates all facets of care.   Polysubstance abuse UDS positive for THC.  Endorses previous cocaine abuse.  Counseled on need for complete cessation. Seen by [facility] for substance abuse resources   Discharge Diagnoses:  Principal Problem:   Hypertensive crisis Active Problems:   Acute on chronic combined systolic and diastolic CHF (congestive heart failure) ([name])   Essential hypertension   PAF (paroxysmal atrial fibrillation) ([name])   CKD (chronic kidney disease), stage III ([name])   Polysubstance abuse ([name])   Class 2 obesity due to excess calories with body mass index (BMI) of 39.0 to 39.9 in adult    From heart and vascular note 01/29/2021: ASSESSMENT & PLAN: Chronic Combined Systolic and Diastolic CHF: -NICM in the setting of long standing uncontrolled severe HTN, poor medication adherence, cocaine use disorder,  -LBBB with QRS 188m -EF 60% in 2017 but  severe LVH and moderate RVH on echo -2020 EF 30-35% -LHC 12/2018 with normal coronaries -last ECHO 01/2021 with EF  30-35%, severe LVH -NYHA Class [name] symptoms, euvolemic/mildly volume up on exam -currently taking Entresto 24/26, spiro 25, carvedilol 25 BID, hydralazine 100 TID, IMDUR 30, lasix 40 BID, 52mq K BID -increase entresto to 49/51, decrease lasix to '60mg'$  daily, add farxiga '10mg'$ , decrease K to 174m daily   HTN: -Remains hypertensive did have an argument and smoking before arrival, denies cocaine use, has no symptoms -increase entresto to 49/51   CKD3a: -repeat bmp today   Afib/Aflutter: -regular on exam today -continue xarelto, carvedilol    Tobacco Use disorder: -continues to decrease down from 1ppd to 3-5cigs per day   Cocaine Use Disorder: -says he has not used cocaine since hospitalization and will not use again

## 2021-03-05 ENCOUNTER — Ambulatory Visit (HOSPITAL_COMMUNITY): Payer: Self-pay

## 2021-03-05 ENCOUNTER — Telehealth (HOSPITAL_COMMUNITY): Payer: Self-pay | Admitting: Surgery

## 2021-03-05 NOTE — Telephone Encounter (Signed)
I called patient to remind him of his appt in the [facility].  I left a message to indicate appt time and ask that he call back if unable to make appt.

## 2021-03-30 ENCOUNTER — Emergency Department (HOSPITAL_COMMUNITY): Payer: Self-pay

## 2021-03-30 ENCOUNTER — Inpatient Hospital Stay (HOSPITAL_COMMUNITY)
Admission: EM | Admit: 2021-03-30 | Discharge: 2021-04-02 | DRG: 291 | Disposition: A | Discharge status: Home / Self Care | Payer: Self-pay | Attending: Internal Medicine | Admitting: Internal Medicine

## 2021-03-30 ENCOUNTER — Other Ambulatory Visit: Payer: Self-pay

## 2021-03-30 ENCOUNTER — Encounter (HOSPITAL_COMMUNITY): Payer: Self-pay | Admitting: Emergency Medicine

## 2021-03-30 DIAGNOSIS — N1832 Chronic kidney disease, stage 3b: Secondary | ICD-10-CM | POA: Diagnosis present

## 2021-03-30 DIAGNOSIS — Z09 Encounter for follow-up examination after completed treatment for conditions other than malignant neoplasm: Secondary | ICD-10-CM

## 2021-03-30 DIAGNOSIS — E785 Hyperlipidemia, unspecified: Secondary | ICD-10-CM | POA: Diagnosis present

## 2021-03-30 DIAGNOSIS — Z6838 Body mass index (BMI) 38.0-38.9, adult: Secondary | ICD-10-CM

## 2021-03-30 DIAGNOSIS — I10 Essential (primary) hypertension: Secondary | ICD-10-CM | POA: Diagnosis present

## 2021-03-30 DIAGNOSIS — I447 Left bundle-branch block, unspecified: Secondary | ICD-10-CM

## 2021-03-30 DIAGNOSIS — N183 Chronic kidney disease, stage 3 unspecified: Secondary | ICD-10-CM | POA: Diagnosis present

## 2021-03-30 DIAGNOSIS — I509 Heart failure, unspecified: Secondary | ICD-10-CM

## 2021-03-30 DIAGNOSIS — I4892 Unspecified atrial flutter: Secondary | ICD-10-CM | POA: Diagnosis present

## 2021-03-30 DIAGNOSIS — I50813 Acute on chronic right heart failure: Secondary | ICD-10-CM

## 2021-03-30 DIAGNOSIS — Z20822 Contact with and (suspected) exposure to covid-19: Secondary | ICD-10-CM | POA: Diagnosis present

## 2021-03-30 DIAGNOSIS — F1410 Cocaine abuse, uncomplicated: Secondary | ICD-10-CM | POA: Diagnosis present

## 2021-03-30 DIAGNOSIS — I161 Hypertensive emergency: Secondary | ICD-10-CM | POA: Diagnosis present

## 2021-03-30 DIAGNOSIS — E1122 Type 2 diabetes mellitus with diabetic chronic kidney disease: Secondary | ICD-10-CM | POA: Diagnosis present

## 2021-03-30 DIAGNOSIS — I5031 Acute diastolic (congestive) heart failure: Secondary | ICD-10-CM

## 2021-03-30 DIAGNOSIS — I480 Paroxysmal atrial fibrillation: Secondary | ICD-10-CM | POA: Diagnosis present

## 2021-03-30 DIAGNOSIS — R000 Tachycardia, unspecified: Secondary | ICD-10-CM

## 2021-03-30 DIAGNOSIS — J189 Pneumonia, unspecified organism: Secondary | ICD-10-CM

## 2021-03-30 DIAGNOSIS — N1831 Chronic kidney disease, stage 3a: Secondary | ICD-10-CM | POA: Diagnosis present

## 2021-03-30 DIAGNOSIS — Z9114 Patient's other noncompliance with medication regimen: Secondary | ICD-10-CM

## 2021-03-30 DIAGNOSIS — I428 Other cardiomyopathies: Secondary | ICD-10-CM | POA: Diagnosis present

## 2021-03-30 DIAGNOSIS — E669 Obesity, unspecified: Secondary | ICD-10-CM | POA: Diagnosis present

## 2021-03-30 DIAGNOSIS — I5023 Acute on chronic systolic (congestive) heart failure: Secondary | ICD-10-CM | POA: Diagnosis present

## 2021-03-30 DIAGNOSIS — R079 Chest pain, unspecified: Secondary | ICD-10-CM

## 2021-03-30 DIAGNOSIS — E876 Hypokalemia: Secondary | ICD-10-CM | POA: Diagnosis present

## 2021-03-30 DIAGNOSIS — I130 Hypertensive heart and chronic kidney disease with heart failure and stage 1 through stage 4 chronic kidney disease, or unspecified chronic kidney disease: Principal | ICD-10-CM | POA: Diagnosis present

## 2021-03-30 DIAGNOSIS — F1910 Other psychoactive substance abuse, uncomplicated: Secondary | ICD-10-CM | POA: Diagnosis present

## 2021-03-30 HISTORY — DX: Heart failure, unspecified: I50.9

## 2021-03-30 LAB — CBC WITH DIFFERENTIAL/PLATELET
Abs Immature Granulocytes: 0.03 10*3/uL (ref 0.00–0.07)
Basophils Absolute: 0 10*3/uL (ref 0.0–0.1)
Basophils Relative: 0 %
Eosinophils Absolute: 0.1 10*3/uL (ref 0.0–0.5)
Eosinophils Relative: 1 %
HCT: 35.6 % — ABNORMAL LOW (ref 39.0–52.0)
Hemoglobin: 11 g/dL — ABNORMAL LOW (ref 13.0–17.0)
Immature Granulocytes: 0 %
Lymphocytes Relative: 14 %
Lymphs Abs: 1.2 10*3/uL (ref 0.7–4.0)
MCH: 21.5 pg — ABNORMAL LOW (ref 26.0–34.0)
MCHC: 30.9 g/dL (ref 30.0–36.0)
MCV: 69.7 fL — ABNORMAL LOW (ref 80.0–100.0)
Monocytes Absolute: 0.7 10*3/uL (ref 0.1–1.0)
Monocytes Relative: 8 %
Neutro Abs: 6.1 10*3/uL (ref 1.7–7.7)
Neutrophils Relative %: 77 %
Platelets: 203 10*3/uL (ref 150–400)
RBC: 5.11 MIL/uL (ref 4.22–5.81)
RDW: 19.2 % — ABNORMAL HIGH (ref 11.5–15.5)
WBC: 8.1 10*3/uL (ref 4.0–10.5)
nRBC: 0 % (ref 0.0–0.2)

## 2021-03-30 LAB — RESP PANEL BY RT-PCR (FLU A&B, COVID) ARPGX2
Influenza A by PCR: NEGATIVE
Influenza B by PCR: NEGATIVE
SARS Coronavirus 2 by RT PCR: NEGATIVE

## 2021-03-30 LAB — BASIC METABOLIC PANEL
Anion gap: 10 (ref 5–15)
BUN: 19 mg/dL (ref 6–20)
CO2: 21 mmol/L — ABNORMAL LOW (ref 22–32)
Calcium: 9.1 mg/dL (ref 8.9–10.3)
Chloride: 106 mmol/L (ref 98–111)
Creatinine, Ser: 2.05 mg/dL — ABNORMAL HIGH (ref 0.61–1.24)
GFR, Estimated: 41 mL/min — ABNORMAL LOW (ref >60)
Glucose, Bld: 113 mg/dL — ABNORMAL HIGH (ref 70–99)
Potassium: 2.9 mmol/L — ABNORMAL LOW (ref 3.5–5.1)
Sodium: 137 mmol/L (ref 135–145)

## 2021-03-30 LAB — CBC
HCT: 35.3 % — ABNORMAL LOW (ref 39.0–52.0)
Hemoglobin: 10.8 g/dL — ABNORMAL LOW (ref 13.0–17.0)
MCH: 21.4 pg — ABNORMAL LOW (ref 26.0–34.0)
MCHC: 30.6 g/dL (ref 30.0–36.0)
MCV: 70 fL — ABNORMAL LOW (ref 80.0–100.0)
Platelets: 208 10*3/uL (ref 150–400)
RBC: 5.04 MIL/uL (ref 4.22–5.81)
RDW: 19.2 % — ABNORMAL HIGH (ref 11.5–15.5)
WBC: 8.1 10*3/uL (ref 4.0–10.5)
nRBC: 0 % (ref 0.0–0.2)

## 2021-03-30 LAB — BRAIN NATRIURETIC PEPTIDE: B Natriuretic Peptide: >4500 pg/mL — ABNORMAL HIGH (ref 0.0–100.0)

## 2021-03-30 LAB — HEMOGLOBIN AND HEMATOCRIT, BLOOD
HCT: 34.2 % — ABNORMAL LOW (ref 39.0–52.0)
Hemoglobin: 10.9 g/dL — ABNORMAL LOW (ref 13.0–17.0)

## 2021-03-30 LAB — HEMOGLOBIN A1C
Hgb A1c MFr Bld: 6.2 % — ABNORMAL HIGH (ref 4.8–5.6)
Mean Plasma Glucose: 131.24 mg/dL

## 2021-03-30 LAB — CBG MONITORING, ED: Glucose-Capillary: 114 mg/dL — ABNORMAL HIGH (ref 70–99)

## 2021-03-30 LAB — TROPONIN I (HIGH SENSITIVITY)
Troponin I (High Sensitivity): 588 ng/L (ref <18)
Troponin I (High Sensitivity): 570 ng/L (ref <18)

## 2021-03-30 LAB — GLUCOSE, CAPILLARY: Glucose-Capillary: 140 mg/dL — ABNORMAL HIGH (ref 70–99)

## 2021-03-30 LAB — LACTIC ACID, PLASMA: Lactic Acid, Venous: 0.9 mmol/L (ref 0.5–1.9)

## 2021-03-30 MED ORDER — DOXYCYCLINE HYCLATE 100 MG PO TABS
100.0000 mg | ORAL_TABLET | Freq: Two times a day (BID) | ORAL | Status: DC
  Administered 2021-03-31: 100 mg via ORAL
  Filled 2021-03-30: qty 1

## 2021-03-30 MED ORDER — HYDRALAZINE HCL 50 MG PO TABS
100.0000 mg | ORAL_TABLET | Freq: Three times a day (TID) | ORAL | Status: DC
  Administered 2021-03-30 – 2021-04-02 (×9): 100 mg via ORAL
  Filled 2021-03-30 (×9): qty 2

## 2021-03-30 MED ORDER — BENZONATATE 100 MG PO CAPS
200.0000 mg | ORAL_CAPSULE | Freq: Three times a day (TID) | ORAL | Status: DC
  Administered 2021-03-30 – 2021-04-02 (×9): 200 mg via ORAL
  Filled 2021-03-30 (×9): qty 2

## 2021-03-30 MED ORDER — LORAZEPAM 2 MG/ML IJ SOLN
0.5000 mg | Freq: Once | INTRAMUSCULAR | Status: AC
  Administered 2021-03-30: 0.5 mg via INTRAVENOUS
  Filled 2021-03-30: qty 1

## 2021-03-30 MED ORDER — ASPIRIN 81 MG PO CHEW
81.0000 mg | CHEWABLE_TABLET | Freq: Every day | ORAL | Status: DC
  Administered 2021-03-30 – 2021-04-02 (×4): 81 mg via ORAL
  Filled 2021-03-30 (×5): qty 1

## 2021-03-30 MED ORDER — NITROGLYCERIN 0.4 MG SL SUBL
0.4000 mg | SUBLINGUAL_TABLET | SUBLINGUAL | Status: DC | PRN
  Administered 2021-03-30: 0.4 mg via SUBLINGUAL
  Filled 2021-03-30 (×2): qty 1

## 2021-03-30 MED ORDER — ONDANSETRON HCL 4 MG/2ML IJ SOLN: 4.0000 mg | Freq: Four times a day (QID) | INTRAMUSCULAR | Status: DC | PRN

## 2021-03-30 MED ORDER — POTASSIUM CHLORIDE CRYS ER 20 MEQ PO TBCR: 40.0000 meq | EXTENDED_RELEASE_TABLET | Freq: Two times a day (BID) | ORAL | Status: DC

## 2021-03-30 MED ORDER — SODIUM CHLORIDE 0.9 % IV SOLN: 250.0000 mL | INTRAVENOUS | Status: DC | PRN

## 2021-03-30 MED ORDER — ATORVASTATIN CALCIUM 40 MG PO TABS
40.0000 mg | ORAL_TABLET | Freq: Every day | ORAL | Status: DC
  Administered 2021-03-30 – 2021-04-02 (×4): 40 mg via ORAL
  Filled 2021-03-30 (×4): qty 1

## 2021-03-30 MED ORDER — SODIUM CHLORIDE 0.9% FLUSH
3.0000 mL | Freq: Two times a day (BID) | INTRAVENOUS | Status: DC
  Administered 2021-03-30 – 2021-04-02 (×6): 3 mL via INTRAVENOUS

## 2021-03-30 MED ORDER — IOHEXOL 350 MG/ML SOLN
100.0000 mL | Freq: Once | INTRAVENOUS | Status: AC | PRN
  Administered 2021-03-30: 100 mL via INTRAVENOUS

## 2021-03-30 MED ORDER — FUROSEMIDE 10 MG/ML IJ SOLN
40.0000 mg | Freq: Once | INTRAMUSCULAR | Status: AC
  Administered 2021-03-30: 40 mg via INTRAVENOUS
  Filled 2021-03-30: qty 4

## 2021-03-30 MED ORDER — HYDRALAZINE HCL 25 MG PO TABS
100.0000 mg | ORAL_TABLET | Freq: Once | ORAL | Status: AC
  Administered 2021-03-30: 100 mg via ORAL
  Filled 2021-03-30: qty 4

## 2021-03-30 MED ORDER — GUAIFENESIN ER 600 MG PO TB12
1200.0000 mg | ORAL_TABLET | Freq: Two times a day (BID) | ORAL | Status: DC
  Administered 2021-03-30 – 2021-04-02 (×6): 1200 mg via ORAL
  Filled 2021-03-30 (×6): qty 2

## 2021-03-30 MED ORDER — IPRATROPIUM-ALBUTEROL 0.5-2.5 (3) MG/3ML IN SOLN
3.0000 mL | Freq: Four times a day (QID) | RESPIRATORY_TRACT | Status: DC
  Administered 2021-03-30 (×2): 3 mL via RESPIRATORY_TRACT
  Filled 2021-03-30 (×2): qty 3

## 2021-03-30 MED ORDER — ISOSORBIDE MONONITRATE ER 30 MG PO TB24
30.0000 mg | ORAL_TABLET | Freq: Every day | ORAL | Status: DC
  Administered 2021-03-31 – 2021-04-02 (×3): 30 mg via ORAL
  Filled 2021-03-30 (×3): qty 1

## 2021-03-30 MED ORDER — FUROSEMIDE 10 MG/ML IJ SOLN
40.0000 mg | Freq: Every day | INTRAMUSCULAR | Status: DC
  Administered 2021-03-31: 40 mg via INTRAVENOUS
  Filled 2021-03-30: qty 4

## 2021-03-30 MED ORDER — POTASSIUM CHLORIDE CRYS ER 10 MEQ PO TBCR
10.0000 meq | EXTENDED_RELEASE_TABLET | Freq: Every day | ORAL | Status: DC
  Administered 2021-03-31: 10 meq via ORAL
  Filled 2021-03-30: qty 1

## 2021-03-30 MED ORDER — SPIRONOLACTONE 25 MG PO TABS
25.0000 mg | ORAL_TABLET | Freq: Every day | ORAL | Status: DC
  Administered 2021-03-30 – 2021-04-02 (×4): 25 mg via ORAL
  Filled 2021-03-30 (×4): qty 1

## 2021-03-30 MED ORDER — LABETALOL HCL 5 MG/ML IV SOLN
10.0000 mg | INTRAVENOUS | Status: DC | PRN
  Administered 2021-03-30: 10 mg via INTRAVENOUS
  Filled 2021-03-30: qty 4

## 2021-03-30 MED ORDER — CARVEDILOL 25 MG PO TABS
25.0000 mg | ORAL_TABLET | Freq: Two times a day (BID) | ORAL | Status: DC
  Administered 2021-03-30 – 2021-04-02 (×6): 25 mg via ORAL
  Filled 2021-03-30 (×3): qty 1
  Filled 2021-03-30: qty 8
  Filled 2021-03-30 (×2): qty 1

## 2021-03-30 MED ORDER — IPRATROPIUM-ALBUTEROL 0.5-2.5 (3) MG/3ML IN SOLN
3.0000 mL | Freq: Three times a day (TID) | RESPIRATORY_TRACT | Status: DC
  Administered 2021-03-31 – 2021-04-01 (×4): 3 mL via RESPIRATORY_TRACT
  Filled 2021-03-30 (×3): qty 3

## 2021-03-30 MED ORDER — SODIUM CHLORIDE 0.9 % IV SOLN
500.0000 mg | Freq: Once | INTRAVENOUS | Status: AC
  Administered 2021-03-30: 500 mg via INTRAVENOUS
  Filled 2021-03-30: qty 500

## 2021-03-30 MED ORDER — RIVAROXABAN 20 MG PO TABS
20.0000 mg | ORAL_TABLET | Freq: Every day | ORAL | Status: DC
  Administered 2021-03-30 – 2021-04-01 (×3): 20 mg via ORAL
  Filled 2021-03-30 (×4): qty 1

## 2021-03-30 MED ORDER — HYDRALAZINE HCL 25 MG PO TABS: 100.0000 mg | ORAL_TABLET | Freq: Three times a day (TID) | ORAL | Status: DC

## 2021-03-30 MED ORDER — POTASSIUM CHLORIDE CRYS ER 10 MEQ PO TBCR: 10.0000 meq | EXTENDED_RELEASE_TABLET | Freq: Every day | ORAL | Status: DC

## 2021-03-30 MED ORDER — POTASSIUM CHLORIDE CRYS ER 20 MEQ PO TBCR
40.0000 meq | EXTENDED_RELEASE_TABLET | Freq: Once | ORAL | Status: AC
  Administered 2021-03-30: 40 meq via ORAL
  Filled 2021-03-30: qty 2

## 2021-03-30 MED ORDER — SODIUM CHLORIDE 0.9 % IV SOLN
1.0000 g | Freq: Once | INTRAVENOUS | Status: AC
  Administered 2021-03-30: 1 g via INTRAVENOUS
  Filled 2021-03-30: qty 10

## 2021-03-30 MED ORDER — ACETAMINOPHEN 325 MG PO TABS
650.0000 mg | ORAL_TABLET | ORAL | Status: DC | PRN
  Administered 2021-03-30 – 2021-03-31 (×4): 650 mg via ORAL
  Filled 2021-03-30 (×4): qty 2

## 2021-03-30 MED ORDER — POTASSIUM CHLORIDE 10 MEQ/100ML IV SOLN
10.0000 meq | Freq: Once | INTRAVENOUS | Status: AC
  Administered 2021-03-30: 10 meq via INTRAVENOUS
  Filled 2021-03-30: qty 100

## 2021-03-30 MED ORDER — SACUBITRIL-VALSARTAN 49-51 MG PO TABS
1.0000 | ORAL_TABLET | Freq: Two times a day (BID) | ORAL | Status: DC
  Administered 2021-03-30 – 2021-04-02 (×7): 1 via ORAL
  Filled 2021-03-30 (×8): qty 1

## 2021-03-30 MED ORDER — SODIUM CHLORIDE 0.9% FLUSH: 3.0000 mL | INTRAVENOUS | Status: DC | PRN

## 2021-03-30 MED ORDER — INSULIN ASPART 100 UNIT/ML IJ SOLN
0.0000 [IU] | Freq: Three times a day (TID) | INTRAMUSCULAR | Status: DC
  Administered 2021-04-01 – 2021-04-02 (×2): 1 [IU] via SUBCUTANEOUS

## 2021-03-30 NOTE — ED Notes (Signed)
Pt NAD in bed, wide complex tachycardia on monitor. Pt a/ox4, speaking in full and complete sentences. Pt c/o increasing SOB with CP on coughing x several days. Pt states his father was in ICU and he stopped taking meds for CHF. Pt states cough with pink sputum, +orthopnea. Denies SOB and CP at this time. Negative edema noted. LS clear throughout with dim left base. Other VS WNL.

## 2021-03-30 NOTE — ED Notes (Signed)
Patient transported to CT 

## 2021-03-30 NOTE — ED Triage Notes (Signed)
Pt to triage via [facility].  Diagnosed with pneumonia in June and states he isn't feeling any better.  Non-compliant with medications for financial reasons.  Reports SOB x 2 days, productive cough with green sputum and blood in sputum, chest pain with coughing.  18g L AC.  ASA '324mg'$  and NTG x 1 given by EMS.  States chest pain better after NTG.

## 2021-03-30 NOTE — ED Notes (Signed)
Date and time results received: 03/30/21 @ 1035  Test: Troponin Critical Value: 588  Name of Provider Notified: Dr. Billy Fischer  Orders Received? Or Actions Taken?: None at this time.

## 2021-03-30 NOTE — H&P (Signed)
History and Physical    Gerald [name] [id number] DOB: 11-02-1980 DOA: 03/30/2021  PCP: Patient, [name] (Inactive) (Confirm with patient/family/NH records and if not entered, this has to be entered at [facility] point of entry) Patient coming from: Home  I have personally briefly reviewed patient's old medical records in [place]  Chief Complaint: Cough, SOB  HPI: Gerald [name] is a 40 y.o. male with medical history significant of chronic diastolic CHF, PAF on Xarelto, HTN, polysubstance abuse, CKD stage IIIb, morbid obesity, IIDM, chronic troponin elevation, chronic LBBB, presented with recurrent cough, shortness of breath and hemoptysis.  Patient was previously hospitalized 2 times in May and June for similar presentation with persistent cough, increasing short of breath and hemoptysis of blood-tinged clear phlegm, after noncompliant with her home CHF/BP medications.  This time, patient claims that he was not able to go to this appointment to help CHF/BP medication reviewed last 2 weeks.  Starting 4 days ago, he started to have a cough that became productive with clear phlegm, then he started to cough up blood tinged clear phlegm, and developed pleuritic chest pain worsening with cough.  He recorded no fever at home temperature remained stable 97-98 every evening.  ED Course: Blood pressure significantly elevated, temperature 100.2, CT angiogram negative for PE positive for bilateral multifocal infiltrates edema versus atypical/viral pneumonia.    Patient was given Lasix, ceftriaxone and azithromycin  Troponin 588>570, EKG showed chronic LBBB, cardiology was consulted and considered elevation of troponin likely chronic and secondary to CHF compensation recommend medical management with aggressive diuresis.  Review of Systems: As per HPI otherwise 14 point review of systems negative.    Past Medical History:  Diagnosis Date   Arrhythmia    CHF (congestive heart failure)  ([name])    Dysrhythmia    brief episode afib, cardioverted did not return   Hypertension    Polysubstance abuse [facility])     Past Surgical History:  Procedure Laterality Date   KNEE SURGERY Right    LEFT HEART CATH AND CORONARY ANGIOGRAPHY N/A 12/21/2020   Procedure: LEFT HEART CATH AND CORONARY ANGIOGRAPHY;  Surgeon: Martinique, Peter M, MD;  Location: [place] [facility];  Service: Cardiovascular;  Laterality: N/A;   TEE WITHOUT CARDIOVERSION N/A 04/04/2019   Procedure: TRANSESOPHAGEAL ECHOCARDIOGRAM (TEE);  Surgeon: [name], MD;  Location: [facility];  Service: Cardiovascular;  Laterality: N/A;     reports that he has been smoking cigarettes. He has a 21.00 pack-year smoking history. He has never used smokeless tobacco. He reports current alcohol use. He reports current drug use. Drugs: Cocaine and Marijuana.  No Known Allergies  Family History  Problem Relation Age of Onset   Hypertension Father    Heart disease Paternal Grandfather      Prior to Admission medications   Medication Sig Start Date End Date Taking? Authorizing Provider  aspirin 81 MG EC tablet Take 1 tablet (81 mg total) by mouth daily. Swallow whole. 01/24/21 04/24/21 Yes [place] Eric J, DO  atorvastatin (LIPITOR) 40 MG tablet Take 1 tablet (40 mg total) by mouth daily. 01/23/21 04/23/21 Yes [place] Eric J, DO  carvedilol (COREG) 25 MG tablet Take 1 tablet (25 mg total) by mouth 2 (two) times daily with a meal. 01/23/21 04/23/21 Yes [place] Donnamarie Poag, DO  dapagliflozin propanediol (FARXIGA) 10 MG TABS tablet Take 1 tablet (10 mg total) by mouth daily before breakfast. 01/29/21  Yes Winfrey, Jenne Pane, MD  furosemide (LASIX)  40 MG tablet Take 1.5 tablets (60 mg total) by mouth daily. 01/29/21  Yes Katherine Roan, MD  hydrALAZINE (APRESOLINE) 100 MG tablet Take 1 tablet (100 mg total) by mouth every 8 (eight) hours. 01/23/21 04/23/21 Yes [place] Eric J, DO  isosorbide mononitrate (IMDUR) 30 MG 24 hr tablet Take 1 tablet (30 mg  total) by mouth daily. 01/24/21 04/24/21 Yes [place] Eric J, DO  potassium chloride (KLOR-CON) 10 MEQ tablet Take 1 tablet (10 mEq total) by mouth daily. 01/29/21 04/29/21 Yes Katherine Roan, MD  rivaroxaban (XARELTO) 20 MG TABS tablet Take 1 tablet (20 mg total) by mouth daily with supper. 01/23/21 04/23/21 Yes [place] Eric J, DO  sacubitril-valsartan (ENTRESTO) 49-51 MG Take 1 tablet by mouth 2 (two) times daily. 01/29/21  Yes Katherine Roan, MD  spironolactone (ALDACTONE) 25 MG tablet Take 1 tablet (25 mg total) by mouth daily. 01/24/21 04/24/21 Yes [place] Eric J, DO    Physical Exam: Vitals:   03/30/21 1035 03/30/21 1040 03/30/21 1050 03/30/21 1215  BP: (!) 180/143  (!) 185/127 (!) 180/134  Pulse: (!) 108 (!) 110 (!) 108 (!) 107  Resp: (!) 23 (!) 31 (!) 23 (!) 26  Temp:      TempSrc:      SpO2: 96% 98% 94% 95%    Constitutional: NAD, calm, comfortable Vitals:   03/30/21 1035 03/30/21 1040 03/30/21 1050 03/30/21 1215  BP: (!) 180/143  (!) 185/127 (!) 180/134  Pulse: (!) 108 (!) 110 (!) 108 (!) 107  Resp: (!) 23 (!) 31 (!) 23 (!) 26  Temp:      TempSrc:      SpO2: 96% 98% 94% 95%   Eyes: PERRL, lids and conjunctivae normal ENMT: Mucous membranes are moist. Posterior pharynx clear of any exudate or lesions.Normal dentition.  Neck: normal, supple, no masses, no thyromegaly Respiratory: clear to auscultation bilaterally, no wheezing, fine crackles on B/L lower fields. Increasing respiratory effort. No accessory muscle use.  Cardiovascular: Regular rate and rhythm, no murmurs / rubs / gallops. 1+ extremity edema. 2+ pedal pulses. No carotid bruits.  Abdomen: no tenderness, no masses palpated. No hepatosplenomegaly. Bowel sounds positive.  Musculoskeletal: no clubbing / cyanosis. No joint deformity upper and lower extremities. Good ROM, no contractures. Normal muscle tone.  Skin: no rashes, lesions, ulcers. No induration Neurologic: CN 2-12 grossly intact. Sensation intact, DTR normal.  Strength 5/5 in all 4.  Psychiatric: Normal judgment and insight. Alert and oriented x 3. Normal mood.     Labs on Admission: I have personally reviewed following labs and imaging studies  CBC: Recent Labs  Lab 03/30/21 0730 03/30/21 0900  WBC 8.1 8.1  NEUTROABS  --  6.1  HGB 10.8* 11.0*  HCT 35.3* 35.6*  MCV 70.0* 69.7*  PLT 208 [id number]   Basic Metabolic Panel: Recent Labs  Lab 03/30/21 0730  NA 137  K 2.9*  CL 106  CO2 21*  GLUCOSE 113*  BUN 19  CREATININE 2.05*  CALCIUM 9.1   GFR: CrCl cannot be calculated (Unknown ideal weight.). Liver Function Tests: No results for input(s): AST, ALT, ALKPHOS, BILITOT, PROT, ALBUMIN in the last 168 hours. No results for input(s): LIPASE, AMYLASE in the last 168 hours. No results for input(s): AMMONIA in the last 168 hours. Coagulation Profile: No results for input(s): INR, PROTIME in the last 168 hours. Cardiac Enzymes: No results for input(s): CKTOTAL, CKMB, CKMBINDEX, TROPONINI in the last 168 hours. BNP (last 3 results) No results for input(s):  PROBNP in the last 8760 hours. HbA1C: No results for input(s): HGBA1C in the last 72 hours. CBG: No results for input(s): GLUCAP in the last 168 hours. Lipid Profile: No results for input(s): CHOL, HDL, LDLCALC, TRIG, CHOLHDL, LDLDIRECT in the last 72 hours. Thyroid Function Tests: No results for input(s): TSH, T4TOTAL, FREET4, T3FREE, THYROIDAB in the last 72 hours. Anemia Panel: No results for input(s): VITAMINB12, FOLATE, FERRITIN, TIBC, IRON, RETICCTPCT in the last 72 hours. Urine analysis: No results found for: COLORURINE, APPEARANCEUR, LABSPEC, PHURINE, GLUCOSEU, HGBUR, BILIRUBINUR, KETONESUR, PROTEINUR, UROBILINOGEN, NITRITE, LEUKOCYTESUR  Radiological Exams on Admission: CT Angio Chest PE W and/or Wo Contrast  Result Date: 03/30/2021 CLINICAL DATA:  40 year old male with shortness of breath. Negative for COVID-19. Today. EXAM: CT ANGIOGRAPHY CHEST WITH CONTRAST TECHNIQUE:  Multidetector CT imaging of the chest was performed using the standard protocol during bolus administration of intravenous contrast. Multiplanar CT image reconstructions and MIPs were obtained to evaluate the vascular anatomy. CONTRAST:  121m OMNIPAQUE IOHEXOL 350 MG/ML SOLN COMPARISON:  Portable chest 0814 hours today. FINDINGS: Cardiovascular: Good contrast bolus timing in the pulmonary arterial tree. No focal filling defect identified in the pulmonary arteries to suggest acute pulmonary embolism. Cardiomegaly is moderate to severe (series 7, image 285). No pericardial effusion. No contrast in the aorta. Mediastinum/Nodes: Reactive appearing mediastinal and hilar lymph nodes, most less than 10 mm. Lungs/Pleura: Major airways are patent. There is a small area of cystic lung disease in the right lower lobe series 6, image 91, but no generalized emphysema or bullous changes. Widespread Patchy and confluent pulmonary opacity ranging from sub solid to early consolidation, scattered in a peribronchial and occasionally subpleural pattern throughout both lungs. No air bronchograms at this time. No pleural effusion. Upper Abdomen: Some contrast reflux into the hepatic IVC and hepatic veins. Otherwise negative visible liver, spleen, left adrenal gland, pancreas and stomach. Musculoskeletal: No osseous abnormality identified. Review of the MIP images confirms the above findings. IMPRESSION: 1. Negative for acute pulmonary embolus. 2. Cardiomegaly with widely scattered bilateral peribronchial opacity ranging from ground-glass to early consolidation. Reactive appearing mediastinal and hilar lymph nodes. And although there does appear to be a degree of right heart failure, the absence of pleural fluid argues against asymmetric pulmonary edema. Differential diagnosis is broad but includes bronchopneumonias including mycoplasma, adenovirus, influenza, PJP (if immunocompromised). But a non-infectious alveolar inflammatory process  or vasculitis might also have this appearance. Electronically Signed   By: HGenevie AnnM.D.   On: 03/30/2021 12:01   DG Chest Port 1 View  Result Date: 03/30/2021 CLINICAL DATA:  Chest pain, short of breath EXAM: PORTABLE CHEST 1 VIEW COMPARISON:  01/20/2021 FINDINGS: Stable large cardiac silhouette. Bilateral airspace disease improved from 01/20/2021. No pneumothorax. No pleural fluid IMPRESSION: Bilateral airspace disease suggest pulmonary edema. Improvement from comparison exam. Electronically Signed   By: SSuzy BouchardM.D.   On: 03/30/2021 09:00    EKG: Independently reviewed.  Chronic LBBB  Assessment/Plan Active Problems:   Acute diastolic CHF (congestive heart failure) ([name])   CHF (congestive heart failure) ([place]  (please populate well all problems here in Problem List. (For example, if patient is on BP meds at home and you resume or decide to hold them, it is a problem that needs to be her. Same for CAD, COPD, HLD and so on)   Acute on chronic diastolic CHF decompensation -Secondary to noncompliant with CHF/BP medications.  With significant symptoms signs of fluid overload. -Educated patient regarding importance of continuous treatment  of CHF and BP.  Restart home BP and CHF medications including Coreg, hydralazine, Imdur, change p.o. Lasix to IV Lasix 40 mg daily for 1 to 2 days.  Hemoptysis -Blood-tinged clear sputum, with signs of fluid overload and possible atypical/viral pneumonia, H&H stable compared to 2 months ago, will continue Xarelto.  Repeat H&H tonight and tomorrow morning.  Question of atypical pneumonia -With productive cough and low-grade fever, COVID and influenza negative. -Discussed with ED physician, will send respiratory panel and atypical study including mycoplasma and Legionella and sputum culture. -Change antibiotics coverage to doxycycline given borderline QTC prolongation  HTN emergency -With signs of endorgan damage of acute on chronic CHF  decompensation. -Resume home BP meds, as needed labetalol. Aiming at normalizing blood pressure within 24 hours for end organ protection.  Chronic elevation of troponins -Troponin elevation pattern is flat, doubt ACS.  Cardiology aware, recommend aggressive treatment of CHF and blood pressure.  History of polysubstance abuse -Denies any abusing cocaine, UDS pending. -If cocaine positive, will consider scheduled benzo for 1 to 2 days.  CKD stage IIIb -Fluid overload, creatinine level stable, IV diuresis for now.  IIDM -Changed to sliding scale for now.  PAF -As above  Morbid obesity -Calorie control.  DVT prophylaxis: Xarelto Code Status: Full code Family Communication: None at bedside Disposition Plan: Expect 1 to 2 days hospital stay for aggressive diuresis and blood pressure control. Consults called: Cardiology Admission status: Telemetry admission   Lequita Halt MD [facility] Pager [phone]  03/30/2021, 12:54 PM

## 2021-03-30 NOTE — Progress Notes (Signed)
Patient states that he does not take insulin or check his blood sugar and would like more info on if he should be doing that at home.

## 2021-03-30 NOTE — ED Provider Notes (Signed)
[facility] EMERGENCY DEPARTMENT Provider Note   CSN: [id number] Arrival date & time: 03/30/21  [id number]     History Chief Complaint  Patient presents with   Chest Pain   Shortness of Breath    Gerald [name] is a 40 y.o. male.  HPI     40yo male with history of CHF, hypertension, polysubstance abuse, atrial fibrillation on xarelto although has not taken any medications in last 2 weeks, admission of pneumonia in June, who presents with cough, shortness of breath and chest pain.  Reports over the last 2 days, he has developed cough which has been productive of sputum with bright red blood, chest pain that he describes as an aching pain that is worse with coughing, with some sharp pain with exertion, improvement with aspirin and nitro.  Reports some shortness of breath with worsening with laying down flat.  Denies any leg pain or swelling.  Did not know of any fevers, however temperature was elevated to 100.3 on arrival to the emergency department.  He received his first 2 COVID vaccines, and has never had COVID, has no known sick contacts.  Reports some body aches.  Denies nausea, vomiting, abdominal pain, sore throat, congestion.  He has been unable to take his medications for the last 2 weeks due to insurance and financial reasons.  He feels similarly to when he had had pneumonia in June   Past Medical History:  Diagnosis Date   Arrhythmia    CHF (congestive heart failure) ([place])    Dysrhythmia    brief episode afib, cardioverted did not return   Hypertension    Polysubstance abuse [facility])     Patient Active Problem List   Diagnosis Date Noted   Hypertensive crisis 01/20/2021   Polysubstance abuse ([place]) 01/20/2021   Class 2 obesity due to excess calories with body mass index (BMI) of 39.0 to 39.9 in adult 01/20/2021   Hypertensive emergency 12/21/2020   CKD (chronic kidney disease), stage III ([place]) 12/21/2020   Acute on chronic systolic CHF (congestive heart  failure) ([place]) 07/05/2020   PAF (paroxysmal atrial fibrillation) ([place]) 07/05/2020   Elevated troponin 07/05/2020   Microcytic anemia 07/05/2020   Hypokalemia 07/05/2020   Malignant hypertension    Acute on chronic combined systolic and diastolic CHF (congestive heart failure) ([name])    Atrial flutter ([place])    Essential hypertension    Acute systolic congestive heart failure ([place])    Acute respiratory failure ([place]) 04/01/2019   NSTEMI (non-ST elevated myocardial infarction) ([place]) 12/21/2015    Past Surgical History:  Procedure Laterality Date   KNEE SURGERY Right    LEFT HEART CATH AND CORONARY ANGIOGRAPHY N/A 12/21/2020   Procedure: LEFT HEART CATH AND CORONARY ANGIOGRAPHY;  Surgeon: Martinique, Peter M, MD;  Location: [facility];  Service: Cardiovascular;  Laterality: N/A;   TEE WITHOUT CARDIOVERSION N/A 04/04/2019   Procedure: TRANSESOPHAGEAL ECHOCARDIOGRAM (TEE);  Surgeon: [name], MD;  Location: [facility] ORS;  Service: Cardiovascular;  Laterality: N/A;       Family History  Problem Relation Age of Onset   Hypertension Father    Heart disease Paternal Grandfather     Social History   Tobacco Use   Smoking status: Every Day    Packs/day: 0.75    Years: 28.00    Pack years: 21.00    Types: Cigarettes   Smokeless tobacco: Never   Tobacco comments:    requests patch  Vaping Use   Vaping  Use: Never used  Substance Use Topics   Alcohol use: Yes    Comment: occas.    Drug use: Yes    Types: Cocaine, Marijuana    Comment: uses marijuana occasionally, reports last use of cocaine 1 month ago as of 01/20/21    Home Medications Prior to Admission medications   Medication Sig Start Date End Date Taking? Authorizing Provider  aspirin 81 MG EC tablet Take 1 tablet (81 mg total) by mouth daily. Swallow whole. 01/24/21 04/24/21 Yes [place] Eric J, DO  atorvastatin (LIPITOR) 40 MG tablet Take 1 tablet (40 mg total) by mouth daily. 01/23/21 04/23/21 Yes [place] Eric J, DO   carvedilol (COREG) 25 MG tablet Take 1 tablet (25 mg total) by mouth 2 (two) times daily with a meal. 01/23/21 04/23/21 Yes [place] Donnamarie Poag, DO  dapagliflozin propanediol (FARXIGA) 10 MG TABS tablet Take 1 tablet (10 mg total) by mouth daily before breakfast. 01/29/21  Yes Winfrey, Jenne Pane, MD  furosemide (LASIX) 40 MG tablet Take 1.5 tablets (60 mg total) by mouth daily. 01/29/21  Yes Katherine Roan, MD  hydrALAZINE (APRESOLINE) 100 MG tablet Take 1 tablet (100 mg total) by mouth every 8 (eight) hours. 01/23/21 04/23/21 Yes [place] Eric J, DO  isosorbide mononitrate (IMDUR) 30 MG 24 hr tablet Take 1 tablet (30 mg total) by mouth daily. 01/24/21 04/24/21 Yes [place] Eric J, DO  potassium chloride (KLOR-CON) 10 MEQ tablet Take 1 tablet (10 mEq total) by mouth daily. 01/29/21 04/29/21 Yes Katherine Roan, MD  rivaroxaban (XARELTO) 20 MG TABS tablet Take 1 tablet (20 mg total) by mouth daily with supper. 01/23/21 04/23/21 Yes [place] Eric J, DO  sacubitril-valsartan (ENTRESTO) 49-51 MG Take 1 tablet by mouth 2 (two) times daily. 01/29/21  Yes Katherine Roan, MD  spironolactone (ALDACTONE) 25 MG tablet Take 1 tablet (25 mg total) by mouth daily. 01/24/21 04/24/21 Yes [place] Donnamarie Poag, DO    Allergies    Patient has no known allergies.  Review of Systems   Review of Systems  Constitutional:  Positive for fever (100.3 in ED).  HENT:  Negative for congestion and sore throat.   Respiratory:  Positive for cough and shortness of breath.   Cardiovascular:  Positive for chest pain.  Gastrointestinal:  Positive for nausea. Negative for abdominal pain, diarrhea and vomiting.  Genitourinary:  Negative for dysuria.  Musculoskeletal:  Positive for myalgias.  Neurological:  Negative for headaches.   Physical Exam Updated Vital Signs BP (!) 180/134   Pulse (!) 107   Temp 99.7 F (37.6 C) (Oral)   Resp (!) 26   SpO2 95%   Physical Exam Vitals and nursing note reviewed.  Constitutional:      General: He is  not in acute distress.    Appearance: He is well-developed. He is not diaphoretic.  HENT:     Head: Normocephalic and atraumatic.  Eyes:     Conjunctiva/sclera: Conjunctivae normal.  Cardiovascular:     Rate and Rhythm: Normal rate and regular rhythm.     Heart sounds: Normal heart sounds. No murmur heard.   No friction rub. No gallop.  Pulmonary:     Effort: Pulmonary effort is normal. No respiratory distress.     Breath sounds: Normal breath sounds. No wheezing or rales.  Abdominal:     General: There is no distension.     Palpations: Abdomen is soft.     Tenderness: There is no abdominal tenderness. There is no guarding.  Musculoskeletal:     Cervical back: Normal range of motion.  Skin:    General: Skin is warm and dry.  Neurological:     Mental Status: He is alert and oriented to person, place, and time.    ED Results / Procedures / Treatments   Labs (all labs ordered are listed, but only abnormal results are displayed) Labs Reviewed  BASIC METABOLIC PANEL - Abnormal; Notable for the following components:      Result Value   Potassium 2.9 (*)    CO2 21 (*)    Glucose, Bld 113 (*)    Creatinine, Ser 2.05 (*)    GFR, Estimated 41 (*)    All other components within normal limits  CBC - Abnormal; Notable for the following components:   Hemoglobin 10.8 (*)    HCT 35.3 (*)    MCV 70.0 (*)    MCH 21.4 (*)    RDW 19.2 (*)    All other components within normal limits  BRAIN NATRIURETIC PEPTIDE - Abnormal; Notable for the following components:   B Natriuretic Peptide >4,500.0 (*)    All other components within normal limits  CBC WITH DIFFERENTIAL/PLATELET - Abnormal; Notable for the following components:   Hemoglobin 11.0 (*)    HCT 35.6 (*)    MCV 69.7 (*)    MCH 21.5 (*)    RDW 19.2 (*)    All other components within normal limits  TROPONIN I (HIGH SENSITIVITY) - Abnormal; Notable for the following components:   Troponin I (High Sensitivity) 588 (*)    All other  components within normal limits  TROPONIN I (HIGH SENSITIVITY) - Abnormal; Notable for the following components:   Troponin I (High Sensitivity) 570 (*)    All other components within normal limits  RESP PANEL BY RT-PCR (FLU A&B, COVID) ARPGX2  CULTURE, BLOOD (ROUTINE X 2)  CULTURE, BLOOD (ROUTINE X 2)  RESPIRATORY PANEL BY PCR  EXPECTORATED SPUTUM ASSESSMENT W GRAM STAIN, RFLX TO RESP C  LACTIC ACID, PLASMA  MYCOPLASMA PNEUMONIAE ANTIBODY, IGM  LEGIONELLA PNEUMOPHILA SEROGP 1 UR AG    EKG EKG Interpretation  Date/Time:  Saturday March 30 2021 07:28:07 EDT Ventricular Rate:  110 PR Interval:  148 QRS Duration: 158 QT Interval:  450 QTC Calculation: 609 R Axis:   -74 Text Interpretation: Sinus tachycardia with Premature atrial complexes Possible Left atrial enlargement Left axis deviation Left bundle branch block Abnormal ECG No significant change since last tracing Confirmed by Gareth Morgan [phone]) on 03/30/2021 7:44:38 AM  Radiology CT Angio Chest PE W and/or Wo Contrast  Result Date: 03/30/2021 CLINICAL DATA:  40 year old male with shortness of breath. Negative for COVID-19. Today. EXAM: CT ANGIOGRAPHY CHEST WITH CONTRAST TECHNIQUE: Multidetector CT imaging of the chest was performed using the standard protocol during bolus administration of intravenous contrast. Multiplanar CT image reconstructions and MIPs were obtained to evaluate the vascular anatomy. CONTRAST:  146m OMNIPAQUE IOHEXOL 350 MG/ML SOLN COMPARISON:  Portable chest 0814 hours today. FINDINGS: Cardiovascular: Good contrast bolus timing in the pulmonary arterial tree. No focal filling defect identified in the pulmonary arteries to suggest acute pulmonary embolism. Cardiomegaly is moderate to severe (series 7, image 285). No pericardial effusion. No contrast in the aorta. Mediastinum/Nodes: Reactive appearing mediastinal and hilar lymph nodes, most less than 10 mm. Lungs/Pleura: Major airways are patent. There is a  small area of cystic lung disease in the right lower lobe series 6, image 91, but no generalized emphysema or bullous changes. Widespread  Patchy and confluent pulmonary opacity ranging from sub solid to early consolidation, scattered in a peribronchial and occasionally subpleural pattern throughout both lungs. No air bronchograms at this time. No pleural effusion. Upper Abdomen: Some contrast reflux into the hepatic IVC and hepatic veins. Otherwise negative visible liver, spleen, left adrenal gland, pancreas and stomach. Musculoskeletal: No osseous abnormality identified. Review of the MIP images confirms the above findings. IMPRESSION: 1. Negative for acute pulmonary embolus. 2. Cardiomegaly with widely scattered bilateral peribronchial opacity ranging from ground-glass to early consolidation. Reactive appearing mediastinal and hilar lymph nodes. And although there does appear to be a degree of right heart failure, the absence of pleural fluid argues against asymmetric pulmonary edema. Differential diagnosis is broad but includes bronchopneumonias including mycoplasma, adenovirus, influenza, PJP (if immunocompromised). But a non-infectious alveolar inflammatory process or vasculitis might also have this appearance. Electronically Signed   By: Genevie Ann M.D.   On: 03/30/2021 12:01   DG Chest Port 1 View  Result Date: 03/30/2021 CLINICAL DATA:  Chest pain, short of breath EXAM: PORTABLE CHEST 1 VIEW COMPARISON:  01/20/2021 FINDINGS: Stable large cardiac silhouette. Bilateral airspace disease improved from 01/20/2021. No pneumothorax. No pleural fluid IMPRESSION: Bilateral airspace disease suggest pulmonary edema. Improvement from comparison exam. Electronically Signed   By: Suzy Bouchard M.D.   On: 03/30/2021 09:00    Procedures Procedures   Medications Ordered in ED Medications  nitroGLYCERIN (NITROSTAT) SL tablet 0.4 mg (0.4 mg Sublingual Given 03/30/21 1208)  cefTRIAXone (ROCEPHIN) 1 g in sodium  chloride 0.9 % 100 mL IVPB (has no administration in time range)  azithromycin (ZITHROMAX) 500 mg in sodium chloride 0.9 % 250 mL IVPB (has no administration in time range)  hydrALAZINE (APRESOLINE) tablet 100 mg (has no administration in time range)  potassium chloride SA (KLOR-CON) CR tablet 40 mEq (has no administration in time range)  furosemide (LASIX) injection 40 mg (has no administration in time range)  potassium chloride 10 mEq in 100 mL IVPB (has no administration in time range)  doxycycline (VIBRA-TABS) tablet 100 mg (has no administration in time range)  LORazepam (ATIVAN) injection 0.5 mg (has no administration in time range)  iohexol (OMNIPAQUE) 350 MG/ML injection 100 mL (100 mLs Intravenous Contrast Given 03/30/21 1132)    ED Course  I have reviewed the triage vital signs and the nursing notes.  Pertinent labs & imaging results that were available during my care of the patient were reviewed by me and considered in my medical decision making (see chart for details).    MDM Rules/Calculators/A&P                            40yo male with history of CHF, hypertension, polysubstance abuse, atrial fibrillation on xarelto although has not taken any medications in last 2 weeks, admission of pneumonia in June, who presents with cough, shortness of breath and chest pain.  Differential diagnosis includes pulmonary embolus, congestive heart failure, pneumonia, COVID-19.  Chest x-ray shows bilateral airspace disease suggestive of pulmonary edema with improvement from prior.  His temperature is mildly elevated to 100.3, and consider additional infectious etiology or thromboembolic disease in the setting of nonadherence to his anticoagulation regimen.    BNP is elevated to greater than 4500, troponins are elevated to 500, and similar on repeat.  EKG shows wide-complex tachycardia which appears similar to prior.  Discussed EKGs with Dr. Audie Box Cardiology who reviewed them, and also  discussed troponin and BNP  elevation.  He recommends diuresis, and adherence to his medications.  CT PE study completed given history of hemoptysis, tachycardia, mild elevation in temperature and shows no evidence of PE, but does show findings more consistent with bronchopneumonia's including mycoplasma, note virus, PJP.   He denies any history of immunocompromise or HIV.  Ordered Rocephin and azithromycin to treat atypical pneumonia, and an RVP.  Ordered Lasix, potassium, hydralazine for his hypertension/hypokalemia/volume overload.    Final Clinical Impression(s) / ED Diagnoses Final diagnoses:  Atypical pneumonia  Tachycardia  LBBB (left bundle branch block)  Acute on chronic right-sided congestive heart failure [facility])    Rx / DC Orders ED Discharge Orders     None        Gareth Morgan, MD 03/30/21 1241

## 2021-03-30 NOTE — ED Notes (Signed)
Pt given ice water per request.

## 2021-03-30 NOTE — ED Notes (Signed)
Pt given blanket per request.  

## 2021-03-31 DIAGNOSIS — E876 Hypokalemia: Secondary | ICD-10-CM

## 2021-03-31 DIAGNOSIS — I483 Typical atrial flutter: Secondary | ICD-10-CM

## 2021-03-31 DIAGNOSIS — I10 Essential (primary) hypertension: Secondary | ICD-10-CM

## 2021-03-31 DIAGNOSIS — F1910 Other psychoactive substance abuse, uncomplicated: Secondary | ICD-10-CM

## 2021-03-31 DIAGNOSIS — I5031 Acute diastolic (congestive) heart failure: Secondary | ICD-10-CM

## 2021-03-31 DIAGNOSIS — N1832 Chronic kidney disease, stage 3b: Secondary | ICD-10-CM

## 2021-03-31 LAB — CBC WITH DIFFERENTIAL/PLATELET
Abs Immature Granulocytes: 0.02 10*3/uL (ref 0.00–0.07)
Basophils Absolute: 0 10*3/uL (ref 0.0–0.1)
Basophils Relative: 0 %
Eosinophils Absolute: 0.2 10*3/uL (ref 0.0–0.5)
Eosinophils Relative: 3 %
HCT: 33 % — ABNORMAL LOW (ref 39.0–52.0)
Hemoglobin: 10.5 g/dL — ABNORMAL LOW (ref 13.0–17.0)
Immature Granulocytes: 0 %
Lymphocytes Relative: 17 %
Lymphs Abs: 1.2 10*3/uL (ref 0.7–4.0)
MCH: 21.8 pg — ABNORMAL LOW (ref 26.0–34.0)
MCHC: 31.8 g/dL (ref 30.0–36.0)
MCV: 68.6 fL — ABNORMAL LOW (ref 80.0–100.0)
Monocytes Absolute: 0.7 10*3/uL (ref 0.1–1.0)
Monocytes Relative: 10 %
Neutro Abs: 4.9 10*3/uL (ref 1.7–7.7)
Neutrophils Relative %: 70 %
Platelets: 197 10*3/uL (ref 150–400)
RBC: 4.81 MIL/uL (ref 4.22–5.81)
RDW: 18.6 % — ABNORMAL HIGH (ref 11.5–15.5)
WBC: 7.1 10*3/uL (ref 4.0–10.5)
nRBC: 0 % (ref 0.0–0.2)

## 2021-03-31 LAB — BASIC METABOLIC PANEL
Anion gap: 10 (ref 5–15)
BUN: 20 mg/dL (ref 6–20)
CO2: 21 mmol/L — ABNORMAL LOW (ref 22–32)
Calcium: 8.5 mg/dL — ABNORMAL LOW (ref 8.9–10.3)
Chloride: 106 mmol/L (ref 98–111)
Creatinine, Ser: 1.9 mg/dL — ABNORMAL HIGH (ref 0.61–1.24)
GFR, Estimated: 45 mL/min — ABNORMAL LOW (ref >60)
Glucose, Bld: 108 mg/dL — ABNORMAL HIGH (ref 70–99)
Potassium: 2.9 mmol/L — ABNORMAL LOW (ref 3.5–5.1)
Sodium: 137 mmol/L (ref 135–145)

## 2021-03-31 LAB — GLUCOSE, CAPILLARY
Glucose-Capillary: 110 mg/dL — ABNORMAL HIGH (ref 70–99)
Glucose-Capillary: 118 mg/dL — ABNORMAL HIGH (ref 70–99)
Glucose-Capillary: 116 mg/dL — ABNORMAL HIGH (ref 70–99)
Glucose-Capillary: 113 mg/dL — ABNORMAL HIGH (ref 70–99)

## 2021-03-31 LAB — TROPONIN I (HIGH SENSITIVITY): Troponin I (High Sensitivity): 535 ng/L (ref <18)

## 2021-03-31 MED ORDER — GUAIFENESIN-DM 100-10 MG/5ML PO SYRP
5.0000 mL | ORAL_SOLUTION | ORAL | Status: DC | PRN
  Administered 2021-03-31: 5 mL via ORAL
  Filled 2021-03-31: qty 5

## 2021-03-31 MED ORDER — DAPAGLIFLOZIN PROPANEDIOL 10 MG PO TABS
10.0000 mg | ORAL_TABLET | Freq: Every day | ORAL | Status: DC
  Administered 2021-03-31 – 2021-04-02 (×3): 10 mg via ORAL
  Filled 2021-03-31 (×3): qty 1

## 2021-03-31 MED ORDER — HYDROXYZINE HCL 10 MG PO TABS
10.0000 mg | ORAL_TABLET | Freq: Three times a day (TID) | ORAL | Status: DC | PRN
Start: 2021-03-31 — End: 2021-04-02
  Administered 2021-03-31 – 2021-04-01 (×2): 10 mg via ORAL
  Filled 2021-03-31 (×2): qty 1

## 2021-03-31 MED ORDER — POTASSIUM CHLORIDE CRYS ER 20 MEQ PO TBCR
40.0000 meq | EXTENDED_RELEASE_TABLET | ORAL | Status: AC
Start: 2021-03-31 — End: 2021-03-31
  Administered 2021-03-31 (×2): 40 meq via ORAL
  Filled 2021-03-31 (×2): qty 2

## 2021-03-31 MED ORDER — FUROSEMIDE 10 MG/ML IJ SOLN
40.0000 mg | Freq: Two times a day (BID) | INTRAMUSCULAR | Status: DC
  Administered 2021-03-31 – 2021-04-01 (×2): 40 mg via INTRAVENOUS
  Filled 2021-03-31 (×2): qty 4

## 2021-03-31 NOTE — Progress Notes (Addendum)
PROGRESS NOTE    Gerald [name]  [id number] DOB: August 22, 1980 DOA: 03/30/2021 PCP: Patient, [name] (Inactive)    Brief Narrative:  Mr. Mileto was admitted to the hospital with the working diagnosis of acute diastolic heart failure exacerbation.   40 yo male with the past medical history of diastolic heart failure, paroxysmal atrial fibrillation, hypertension, polysubstance abuse, chronic kidney disease stage IIIb, type 2 diabetes mellitus and obesity class II who presented with cough and dyspnea.  Multiple prior hospitalization, noted to have poor medical compliance.  4 days leading to his hospitalization he had productive cough, pink frothy sputum, burning chest pain that prompted him to come to the hospital.  On his initial physical examination his blood pressure was 180/143, heart rate 108, respiratory rate 31, oxygen saturation 95%.  His lungs rales bilaterally, no wheezing, heart S1-S2, present, rhythmic, abdomen soft, positive lower extremity edema  Sodium 137, potassium 2.9, chloride 106, bicarb 21, glucose 113, BUN 19, creatinine 2.0, BNP > 4500, high sensitive troponin 588, 517, 535.  White count 8.1, hemoglobin 10.8, hematocrit 35.3, platelets 208. SARS COVID-19 negative.  Chest film with bilateral interstitial infiltrates, positive cardiomegaly  CT chest with multiple bilateral infiltrates, dense centrally and more ground glass at the periphery, positive cystic lesion the peripheral right lower lobe.  No pulmonary embolism.   EKG 110 bpm, left axis deviation, left bundle branch block, sinus rhythm, Q wave V1 to V3, no significant st segment or t wave changes.   Assessment & Plan:   Principal Problem:   Acute diastolic CHF (congestive heart failure) ([name]) Active Problems:   Atrial flutter ([name])   Essential hypertension   Hypokalemia   CKD (chronic kidney disease), stage III ([name])   Polysubstance abuse ([place])   Acute on chronic systolic heart failure/ hypertensive  emergency.  Non ischemic cardiomyopathy, cardiac catheterization 05/22 with no obstructive coronaries. Patient with improvement in dyspnea but not yet back to baseline. At home not able to continue his prescription medications. Oxymetry is 99% on room air. Urine output 1,300 ml since admission.  Troponin elevation due to decompensated heart failure.  Plan to continue diuresis with furosemide increase to 40 mg bid, to keep negative fluid balance.  Continue with carvedilol, hydralazine and isosorbide for heart failure management.  On Entresto and spironolactone.  Add dapagliflozin.   Echocardiogram with LV EF 30 to 35% with global hypokinesis. Preserved RV systolic function. Severe left atrial dilatation.   2. Pulmonary infiltrates. His chest film looks improved from prior hospitalization in 06/22, back then he did not had chest CT.  Continue oxymetry monitoring Will discontinue antibiotics and follow up chest film in 48 hrs. If no improvement will add autoimmune serology.  Continue bronchodilator therapy.   3. CKD stage 3b hypokalemia renal functio with serum cr at 1,90 with K at 2,9 and serum bicarbonate at 21. Continue K correction with Kcl 80 meq in 2 divided doses and follow up renal functio in am' Avoid hypotension and nephrotoxic medications.   4. Paroxysmal atrial fibrillation continue rate control with metoprolol and anticoagulation with rivaroxaban  5. Substance abuse no clinical sings of withdrawal.   6. T2DM / dyslipidemia continue glucose cover and monitoring with insulin sliding scale.  Continue atorvastatin  7. Obesity class 2.  Calculated BMI is 38,8  Patient continue to be at high risk for worsening heart failure   Status is: Inpatient  Remains inpatient appropriate because:IV treatments appropriate due to intensity of illness or inability to take  PO  Dispo: The patient is from: Home              Anticipated d/c is to: Home              Patient currently is not  medically stable to d/c.   Difficult to place patient No    DVT prophylaxis: Rivaroxaban   Code Status:    full  Family Communication:   No family at the bedside     Subjective: Patient continue to have dyspnea but improved, not back to baseline, at home not able to ger his medications.   Objective: Vitals:   03/31/21 0015 03/31/21 0410 03/31/21 0716 03/31/21 0739  BP: (!) 154/96 (!) 156/121 (!) 158/88   Pulse: 80 88 94 100  Resp: '17 19 18 19  '$ Temp: 98.7 F (37.1 C) 98.8 F (37.1 C) 98.5 F (36.9 C)   TempSrc: Oral Oral Oral   SpO2: 94% 98% 98% 99%  Weight:  129.8 kg    Height:        Intake/Output Summary (Last 24 hours) at 03/31/2021 0955 Last data filed at 03/31/2021 0857 Gross per 24 hour  Intake 1514.09 ml  Output 1300 ml  Net 214.09 ml   Filed Weights   03/30/21 1848 03/31/21 0410  Weight: 130.3 kg 129.8 kg    Examination:   General: deconditioned  Neurology: Awake and alert, non focal  E ENT: no pallor, no icterus, oral mucosa moist Cardiovascular: Moderate JVD. S1-S2 present, rhythmic, no gallops, rubs, or murmurs. Trace lower extremity edema. Pulmonary:  positive breath sounds bilaterally with  wheezing, or rhonchi, scattered rales. Gastrointestinal. Abdomen  soft and non tender Skin. No rashes Musculoskeletal: no joint deformities     Data Reviewed: I have personally reviewed following labs and imaging studies  CBC: Recent Labs  Lab 03/30/21 0730 03/30/21 0900 03/30/21 2025 03/31/21 0605  WBC 8.1 8.1  --  7.1  NEUTROABS  --  6.1  --  4.9  HGB 10.8* 11.0* 10.9* 10.5*  HCT 35.3* 35.6* 34.2* 33.0*  MCV 70.0* 69.7*  --  68.6*  PLT 208 203  --  [id number]   Basic Metabolic Panel: Recent Labs  Lab 03/30/21 0730 03/31/21 0605  NA 137 137  K 2.9* 2.9*  CL 106 106  CO2 21* 21*  GLUCOSE 113* 108*  BUN 19 20  CREATININE 2.05* 1.90*  CALCIUM 9.1 8.5*   GFR: Estimated Creatinine Clearance: 72 mL/min (A) (by C-G formula based on SCr of 1.9  mg/dL (H)). Liver Function Tests: No results for input(s): AST, ALT, ALKPHOS, BILITOT, PROT, ALBUMIN in the last 168 hours. No results for input(s): LIPASE, AMYLASE in the last 168 hours. No results for input(s): AMMONIA in the last 168 hours. Coagulation Profile: No results for input(s): INR, PROTIME in the last 168 hours. Cardiac Enzymes: No results for input(s): CKTOTAL, CKMB, CKMBINDEX, TROPONINI in the last 168 hours. BNP (last 3 results) No results for input(s): PROBNP in the last 8760 hours. HbA1C: Recent Labs    03/30/21 2025  HGBA1C 6.2*   CBG: Recent Labs  Lab 03/30/21 1645 03/30/21 2134 03/31/21 0600  GLUCAP 114* 140* 110*   Lipid Profile: No results for input(s): CHOL, HDL, LDLCALC, TRIG, CHOLHDL, LDLDIRECT in the last 72 hours. Thyroid Function Tests: No results for input(s): TSH, T4TOTAL, FREET4, T3FREE, THYROIDAB in the last 72 hours. Anemia Panel: No results for input(s): VITAMINB12, FOLATE, FERRITIN, TIBC, IRON, RETICCTPCT in the last 72 hours.  Radiology Studies: I have reviewed all of the imaging during this hospital visit personally     Scheduled Meds:  aspirin  81 mg Oral Daily   atorvastatin  40 mg Oral Daily   benzonatate  200 mg Oral TID   carvedilol  25 mg Oral BID WC   doxycycline  100 mg Oral Q12H   furosemide  40 mg Intravenous Daily   guaiFENesin  1,200 mg Oral BID   hydrALAZINE  100 mg Oral Q8H   insulin aspart  0-9 Units Subcutaneous TID WC   ipratropium-albuterol  3 mL Nebulization TID   isosorbide mononitrate  30 mg Oral Daily   potassium chloride  10 mEq Oral Daily   rivaroxaban  20 mg Oral Q supper   sacubitril-valsartan  1 tablet Oral BID   sodium chloride flush  3 mL Intravenous Q12H   spironolactone  25 mg Oral Daily   Continuous Infusions:  sodium chloride       LOS: 1 day        Jceon Alverio Gerome Apley, MD

## 2021-03-31 NOTE — Plan of Care (Signed)
  Problem: Education: Goal: Knowledge of General Education information will improve Description: Including pain rating scale, medication(s)/side effects and non-pharmacologic comfort measures Outcome: Progressing   Problem: Health Behavior/Discharge Planning: Goal: Ability to manage health-related needs will improve Outcome: Progressing   Problem: Clinical Measurements: Goal: Respiratory complications will improve Outcome: Progressing   Problem: Clinical Measurements: Goal: Cardiovascular complication will be avoided Outcome: Progressing

## 2021-03-31 NOTE — Plan of Care (Signed)
  Problem: Education: Goal: Knowledge of General Education information will improve Description Including pain rating scale, medication(s)/side effects and non-pharmacologic comfort measures Outcome: Progressing   Problem: Health Behavior/Discharge Planning: Goal: Ability to manage health-related needs will improve Outcome: Progressing   

## 2021-03-31 NOTE — Progress Notes (Signed)
Patient states he has had a dry cough, but sometimes he will cough up bloody phlegm. RN gave patient sputum sample cup and sent sample to lab per order. Sputum was bloody.

## 2021-03-31 NOTE — Plan of Care (Signed)
  Problem: Education: Goal: Knowledge of General Education information will improve Description: Including pain rating scale, medication(s)/side effects and non-pharmacologic comfort measures Outcome: Progressing   Problem: Health Behavior/Discharge Planning: Goal: Ability to manage health-related needs will improve Outcome: Progressing   Problem: Clinical Measurements: Goal: Ability to maintain clinical measurements within normal limits will improve Outcome: Progressing Goal: Will remain free from infection Outcome: Progressing Goal: Diagnostic test results will improve Outcome: Progressing Goal: Respiratory complications will improve Outcome: Progressing Goal: Cardiovascular complication will be avoided Outcome: Progressing   Problem: Activity: Goal: Risk for activity intolerance will decrease Outcome: Progressing   Problem: Nutrition: Goal: Adequate nutrition will be maintained Outcome: Progressing   Problem: Coping: Goal: Level of anxiety will decrease Outcome: Progressing   Problem: Elimination: Goal: Will not experience complications related to bowel motility Outcome: Progressing Goal: Will not experience complications related to urinary retention Outcome: Progressing   Problem: Pain Managment: Goal: General experience of comfort will improve Outcome: Progressing   Problem: Safety: Goal: Ability to remain free from injury will improve Outcome: Progressing   Problem: Skin Integrity: Goal: Risk for impaired skin integrity will decrease Outcome: Progressing   Problem: Education: Goal: Ability to demonstrate management of disease process will improve Outcome: Progressing Goal: Ability to verbalize understanding of medication therapies will improve Outcome: Progressing Goal: Individualized Educational Video(s) Outcome: Progressing   Problem: Activity: Goal: Capacity to carry out activities will improve Outcome: Progressing   Problem: Cardiac: Goal:  Ability to achieve and maintain adequate cardiopulmonary perfusion will improve Outcome: Progressing   Problem: Activity: Goal: Ability to tolerate increased activity will improve Outcome: Progressing   Problem: Clinical Measurements: Goal: Ability to maintain a body temperature in the normal range will improve Outcome: Progressing   Problem: Respiratory: Goal: Ability to maintain adequate ventilation will improve Outcome: Progressing Goal: Ability to maintain a clear airway will improve Outcome: Progressing

## 2021-04-01 LAB — GLUCOSE, CAPILLARY
Glucose-Capillary: 97 mg/dL (ref 70–99)
Glucose-Capillary: 113 mg/dL — ABNORMAL HIGH (ref 70–99)
Glucose-Capillary: 141 mg/dL — ABNORMAL HIGH (ref 70–99)
Glucose-Capillary: 115 mg/dL — ABNORMAL HIGH (ref 70–99)

## 2021-04-01 LAB — MAGNESIUM: Magnesium: 1.9 mg/dL (ref 1.7–2.4)

## 2021-04-01 LAB — EXPECTORATED SPUTUM ASSESSMENT W GRAM STAIN, RFLX TO RESP C

## 2021-04-01 LAB — MYCOPLASMA PNEUMONIAE ANTIBODY, IGM: Mycoplasma pneumo IgM: <770 U/mL (ref 0–769)

## 2021-04-01 LAB — BASIC METABOLIC PANEL
Anion gap: 11 (ref 5–15)
BUN: 25 mg/dL — ABNORMAL HIGH (ref 6–20)
CO2: 23 mmol/L (ref 22–32)
Calcium: 8.7 mg/dL — ABNORMAL LOW (ref 8.9–10.3)
Chloride: 104 mmol/L (ref 98–111)
Creatinine, Ser: 2 mg/dL — ABNORMAL HIGH (ref 0.61–1.24)
GFR, Estimated: 42 mL/min — ABNORMAL LOW (ref >60)
Glucose, Bld: 88 mg/dL (ref 70–99)
Potassium: 3.2 mmol/L — ABNORMAL LOW (ref 3.5–5.1)
Sodium: 138 mmol/L (ref 135–145)

## 2021-04-01 MED ORDER — MAGNESIUM SULFATE 2 GM/50ML IV SOLN
2.0000 g | Freq: Once | INTRAVENOUS | Status: AC
  Administered 2021-04-01: 2 g via INTRAVENOUS
  Filled 2021-04-01: qty 50

## 2021-04-01 MED ORDER — POTASSIUM CHLORIDE CRYS ER 20 MEQ PO TBCR
40.0000 meq | EXTENDED_RELEASE_TABLET | ORAL | Status: AC
Start: 2021-04-01 — End: 2021-04-01
  Administered 2021-04-01: 40 meq via ORAL
  Filled 2021-04-01: qty 2

## 2021-04-01 MED ORDER — IPRATROPIUM-ALBUTEROL 0.5-2.5 (3) MG/3ML IN SOLN: 3.0000 mL | RESPIRATORY_TRACT | Status: DC | PRN

## 2021-04-01 NOTE — Progress Notes (Signed)
PROGRESS NOTE    Gerald [name]  [id number] DOB: 03-06-1981 DOA: 03/30/2021 PCP: Patient, [name] (Inactive)    Brief Narrative:  Mr. State was admitted to the hospital with the working diagnosis of acute diastolic heart failure exacerbation.    40 yo male with the past medical history of diastolic heart failure, paroxysmal atrial fibrillation, hypertension, polysubstance abuse, chronic kidney disease stage IIIb, type 2 diabetes mellitus and obesity class II who presented with cough and dyspnea.  Multiple prior hospitalization, noted to have poor medical compliance.  4 days leading to his hospitalization he had productive cough, pink frothy sputum, burning chest pain that prompted him to come to the hospital.  On his initial physical examination his blood pressure was 180/143, heart rate 108, respiratory rate 31, oxygen saturation 95%.  His lungs rales bilaterally, no wheezing, heart S1-S2, present, rhythmic, abdomen soft, positive lower extremity edema   Sodium 137, potassium 2.9, chloride 106, bicarb 21, glucose 113, BUN 19, creatinine 2.0, BNP > 4500, high sensitive troponin 588, 517, 535.  White count 8.1, hemoglobin 10.8, hematocrit 35.3, platelets 208. SARS COVID-19 negative.   Chest film with bilateral interstitial infiltrates, positive cardiomegaly   CT chest with multiple bilateral infiltrates, dense centrally and more ground glass at the periphery, positive cystic lesion the peripheral right lower lobe.  No pulmonary embolism.    EKG 110 bpm, left axis deviation, left bundle branch block, sinus rhythm, Q wave V1 to V3, no significant st segment or t wave changes.   Patient placed on IV furosemide with improvement in volume status.  At home not able to continue his prescription medications.  Assessment & Plan:   Principal Problem:   Acute diastolic CHF (congestive heart failure) ([name]) Active Problems:   Atrial flutter ([name])   Essential hypertension   Hypokalemia    CKD (chronic kidney disease), stage III ([name])   Polysubstance abuse ([place])   Acute on chronic systolic heart failure/ hypertensive emergency.  Non ischemic cardiomyopathy, cardiac catheterization 05/22 with no obstructive coronaries. Echocardiogram with LV EF 30 to 35% with global hypokinesis. Preserved RV systolic function. Severe left atrial dilatation.  Urine output 5000 ml over last 24 hrs. .    Continue medical management with carvedilol, hydralazine and isosorbide Continue with Entresto, spironolactone and dapagliflozin.    Improved volume status plan to transition to oral furosemide in am, Patient will need TOC medications and close follow up.    2. Pulmonary infiltrates. His chest film looks improved from prior hospitalization in 06/22, back then he did not had chest CT.  Dyspnea continue to improve, continue with no extra oxygen requirements.  Continue bronchodilator therapy.  Follow chest film in am.    3. CKD stage 3b hypokalemia, hypopmagensemia  Appropriate diuresis with IV furosemide Renal function with serum cr at 2,0 with K at 3,2 and serum bicarbonate at 23. Mg is 1,9.  Add 40 kcl x2 doses today and follow up renal function in am .  2 g Mag sulfate today.    4. Paroxysmal atrial fibrillation rate control with carvediloil and anticoagulation with rivaroxaban.    5. Substance abuse no withdrawal symptoms .    6. T2DM / dyslipidemia Glucose cover and monitoring with insulin sliding scale.  On atorvastatin   7. Obesity class 2.  Calculated BMI is 38,8     Status is: Inpatient  Remains inpatient appropriate because:Inpatient level of care appropriate due to severity of illness  Dispo: The patient is from: Home  Anticipated d/c is to: Home              Patient currently is not medically stable to d/c.   Difficult to place patient No   DVT prophylaxis: Apixaban    Code Status:     full Family Communication:  No family at the bedside       Subjective: Patient with mild dizziness today, no nausea or vomiting, no chest pain, dyspnea continue to improve along with lower extremity edema.   Objective: Vitals:   04/01/21 0336 04/01/21 0748 04/01/21 0814 04/01/21 1151  BP: (!) 155/125  131/89 127/89  Pulse: 86 71 86 82  Resp: '19 17 18 18  '$ Temp: 98.7 F (37.1 C)  98.6 F (37 C) 98.6 F (37 C)  TempSrc: Oral  Oral Oral  SpO2: 98% 100% 97% 96%  Weight:      Height:        Intake/Output Summary (Last 24 hours) at 04/01/2021 1320 Last data filed at 04/01/2021 1100 Gross per 24 hour  Intake 1320 ml  Output 5850 ml  Net -4530 ml   Filed Weights   03/30/21 1848 03/31/21 0410 04/01/21 0026  Weight: 130.3 kg 129.8 kg 126.2 kg    Examination:   General: Not in pain or dyspnea, deconditioned  Neurology: Awake and alert, non focal  E ENT: no pallor, no icterus, oral mucosa moist Cardiovascular: No JVD. S1-S2 present, rhythmic, no gallops, rubs, or murmurs. Trace lower extremity edema. Pulmonary: positive breath sounds bilaterally, with no wheezing, rhonchi or rales. Gastrointestinal. Abdomen soft and non tender Skin. No rashes Musculoskeletal: no joint deformities     Data Reviewed: I have personally reviewed following labs and imaging studies  CBC: Recent Labs  Lab 03/30/21 0730 03/30/21 0900 03/30/21 2025 03/31/21 0605  WBC 8.1 8.1  --  7.1  NEUTROABS  --  6.1  --  4.9  HGB 10.8* 11.0* 10.9* 10.5*  HCT 35.3* 35.6* 34.2* 33.0*  MCV 70.0* 69.7*  --  68.6*  PLT 208 203  --  [id number]   Basic Metabolic Panel: Recent Labs  Lab 03/30/21 0730 03/31/21 0605 04/01/21 0355  NA 137 137 138  K 2.9* 2.9* 3.2*  CL 106 106 104  CO2 21* 21* 23  GLUCOSE 113* 108* 88  BUN 19 20 25*  CREATININE 2.05* 1.90* 2.00*  CALCIUM 9.1 8.5* 8.7*  MG  --   --  1.9   GFR: Estimated Creatinine Clearance: 67.4 mL/min (A) (by C-G formula based on SCr of 2 mg/dL (H)). Liver Function Tests: No results for input(s): AST, ALT,  ALKPHOS, BILITOT, PROT, ALBUMIN in the last 168 hours. No results for input(s): LIPASE, AMYLASE in the last 168 hours. No results for input(s): AMMONIA in the last 168 hours. Coagulation Profile: No results for input(s): INR, PROTIME in the last 168 hours. Cardiac Enzymes: No results for input(s): CKTOTAL, CKMB, CKMBINDEX, TROPONINI in the last 168 hours. BNP (last 3 results) No results for input(s): PROBNP in the last 8760 hours. HbA1C: Recent Labs    03/30/21 2025  HGBA1C 6.2*   CBG: Recent Labs  Lab 03/31/21 1113 03/31/21 1537 03/31/21 2116 04/01/21 0600 04/01/21 1151  GLUCAP 118* 116* 113* 97 113*   Lipid Profile: No results for input(s): CHOL, HDL, LDLCALC, TRIG, CHOLHDL, LDLDIRECT in the last 72 hours. Thyroid Function Tests: No results for input(s): TSH, T4TOTAL, FREET4, T3FREE, THYROIDAB in the last 72 hours. Anemia Panel: No results for input(s): VITAMINB12, FOLATE, FERRITIN, TIBC, IRON,  RETICCTPCT in the last 72 hours.    Radiology Studies: I have reviewed all of the imaging during this hospital visit personally     Scheduled Meds:  aspirin  81 mg Oral Daily   atorvastatin  40 mg Oral Daily   benzonatate  200 mg Oral TID   carvedilol  25 mg Oral BID WC   dapagliflozin propanediol  10 mg Oral Daily   furosemide  40 mg Intravenous Q12H   guaiFENesin  1,200 mg Oral BID   hydrALAZINE  100 mg Oral Q8H   insulin aspart  0-9 Units Subcutaneous TID WC   isosorbide mononitrate  30 mg Oral Daily   rivaroxaban  20 mg Oral Q supper   sacubitril-valsartan  1 tablet Oral BID   sodium chloride flush  3 mL Intravenous Q12H   spironolactone  25 mg Oral Daily   Continuous Infusions:  sodium chloride       LOS: 2 days        Larin Depaoli Gerome Apley, MD

## 2021-04-01 NOTE — Plan of Care (Signed)
  Problem: Education: Goal: Knowledge of General Education information will improve Description: Including pain rating scale, medication(s)/side effects and non-pharmacologic comfort measures Outcome: Progressing   Problem: Health Behavior/Discharge Planning: Goal: Ability to manage health-related needs will improve Outcome: Progressing   Problem: Clinical Measurements: Goal: Ability to maintain clinical measurements within normal limits will improve Outcome: Progressing Goal: Will remain free from infection Outcome: Progressing Goal: Diagnostic test results will improve Outcome: Progressing Goal: Respiratory complications will improve Outcome: Progressing Goal: Cardiovascular complication will be avoided Outcome: Progressing   Problem: Activity: Goal: Risk for activity intolerance will decrease Outcome: Progressing   Problem: Nutrition: Goal: Adequate nutrition will be maintained Outcome: Progressing   Problem: Coping: Goal: Level of anxiety will decrease Outcome: Progressing   Problem: Elimination: Goal: Will not experience complications related to bowel motility Outcome: Progressing Goal: Will not experience complications related to urinary retention Outcome: Progressing   Problem: Pain Managment: Goal: General experience of comfort will improve Outcome: Progressing   Problem: Safety: Goal: Ability to remain free from injury will improve Outcome: Progressing   Problem: Skin Integrity: Goal: Risk for impaired skin integrity will decrease Outcome: Progressing   Problem: Education: Goal: Ability to demonstrate management of disease process will improve Outcome: Progressing Goal: Ability to verbalize understanding of medication therapies will improve Outcome: Progressing Goal: Individualized Educational Video(s) Outcome: Progressing   Problem: Activity: Goal: Capacity to carry out activities will improve Outcome: Progressing   Problem: Cardiac: Goal:  Ability to achieve and maintain adequate cardiopulmonary perfusion will improve Outcome: Progressing   Problem: Activity: Goal: Ability to tolerate increased activity will improve Outcome: Progressing   Problem: Clinical Measurements: Goal: Ability to maintain a body temperature in the normal range will improve Outcome: Progressing   Problem: Respiratory: Goal: Ability to maintain adequate ventilation will improve Outcome: Progressing Goal: Ability to maintain a clear airway will improve Outcome: Progressing

## 2021-04-02 ENCOUNTER — Other Ambulatory Visit: Payer: Self-pay

## 2021-04-02 ENCOUNTER — Inpatient Hospital Stay (HOSPITAL_COMMUNITY): Payer: Self-pay

## 2021-04-02 ENCOUNTER — Other Ambulatory Visit (HOSPITAL_COMMUNITY): Payer: Self-pay

## 2021-04-02 LAB — BASIC METABOLIC PANEL
Anion gap: 11 (ref 5–15)
BUN: 30 mg/dL — ABNORMAL HIGH (ref 6–20)
CO2: 22 mmol/L (ref 22–32)
Calcium: 9 mg/dL (ref 8.9–10.3)
Chloride: 105 mmol/L (ref 98–111)
Creatinine, Ser: 2.02 mg/dL — ABNORMAL HIGH (ref 0.61–1.24)
GFR, Estimated: 42 mL/min — ABNORMAL LOW (ref >60)
Glucose, Bld: 113 mg/dL — ABNORMAL HIGH (ref 70–99)
Potassium: 3.2 mmol/L — ABNORMAL LOW (ref 3.5–5.1)
Sodium: 138 mmol/L (ref 135–145)

## 2021-04-02 LAB — LEGIONELLA PNEUMOPHILA SEROGP 1 UR AG: L. pneumophila Serogp 1 Ur Ag: NEGATIVE

## 2021-04-02 LAB — GLUCOSE, CAPILLARY
Glucose-Capillary: 118 mg/dL — ABNORMAL HIGH (ref 70–99)
Glucose-Capillary: 123 mg/dL — ABNORMAL HIGH (ref 70–99)

## 2021-04-02 MED ORDER — ISOSORBIDE MONONITRATE ER 30 MG PO TB24
30.0000 mg | ORAL_TABLET | Freq: Every day | ORAL | 0 refills | Status: DC
  Filled 2021-04-02: qty 30, 30d supply, fill #0

## 2021-04-02 MED ORDER — ASPIRIN 81 MG PO TBEC
81.0000 mg | DELAYED_RELEASE_TABLET | Freq: Every day | ORAL | 0 refills | Status: DC
  Filled 2021-04-02: qty 30, 30d supply, fill #0

## 2021-04-02 MED ORDER — ENTRESTO 49-51 MG PO TABS
1.0000 | ORAL_TABLET | Freq: Two times a day (BID) | ORAL | 0 refills | Status: DC
  Filled 2021-04-02: qty 60, 30d supply, fill #0

## 2021-04-02 MED ORDER — HYDRALAZINE HCL 100 MG PO TABS
100.0000 mg | ORAL_TABLET | Freq: Three times a day (TID) | ORAL | 0 refills | Status: DC
  Filled 2021-04-02: qty 90, 30d supply, fill #0

## 2021-04-02 MED ORDER — POTASSIUM CHLORIDE CRYS ER 10 MEQ PO TBCR: 10.0000 meq | EXTENDED_RELEASE_TABLET | Freq: Every day | ORAL | 0 refills | Status: DC

## 2021-04-02 MED ORDER — FUROSEMIDE 40 MG PO TABS: 60.0000 mg | ORAL_TABLET | Freq: Every day | ORAL | 0 refills | Status: DC

## 2021-04-02 MED ORDER — CARVEDILOL 25 MG PO TABS
25.0000 mg | ORAL_TABLET | Freq: Two times a day (BID) | ORAL | 0 refills | Status: DC
  Filled 2021-04-02: qty 60, 30d supply, fill #0

## 2021-04-02 MED ORDER — POTASSIUM CHLORIDE CRYS ER 20 MEQ PO TBCR
40.0000 meq | EXTENDED_RELEASE_TABLET | ORAL | Status: AC
Start: 2021-04-02 — End: 2021-04-02
  Administered 2021-04-02 (×2): 40 meq via ORAL
  Filled 2021-04-02: qty 2

## 2021-04-02 MED ORDER — DAPAGLIFLOZIN PROPANEDIOL 10 MG PO TABS
10.0000 mg | ORAL_TABLET | Freq: Every day | ORAL | 0 refills | Status: AC
  Filled 2021-04-02: qty 30, 30d supply, fill #0

## 2021-04-02 MED ORDER — POTASSIUM CHLORIDE CRYS ER 10 MEQ PO TBCR
10.0000 meq | EXTENDED_RELEASE_TABLET | Freq: Every day | ORAL | 0 refills | Status: DC
  Filled 2021-04-02: qty 30, 30d supply, fill #0

## 2021-04-02 MED ORDER — RIVAROXABAN 20 MG PO TABS
20.0000 mg | ORAL_TABLET | Freq: Every day | ORAL | 0 refills | Status: DC
  Filled 2021-04-02 (×2): qty 30, 30d supply, fill #0

## 2021-04-02 MED ORDER — SPIRONOLACTONE 25 MG PO TABS
25.0000 mg | ORAL_TABLET | Freq: Every day | ORAL | 0 refills | Status: DC
  Filled 2021-04-02: qty 30, 30d supply, fill #0

## 2021-04-02 MED ORDER — FUROSEMIDE 40 MG PO TABS
60.0000 mg | ORAL_TABLET | Freq: Every day | ORAL | 0 refills | Status: DC
  Filled 2021-04-02: qty 45, 30d supply, fill #0

## 2021-04-02 MED ORDER — ATORVASTATIN CALCIUM 40 MG PO TABS
40.0000 mg | ORAL_TABLET | Freq: Every day | ORAL | 0 refills | Status: DC
  Filled 2021-04-02: qty 30, 30d supply, fill #0

## 2021-04-02 NOTE — TOC CM/SW Note (Addendum)
234 PM HF TOC CM contacted [employer] for Entresto refill with pt on phone they will ship out tomorrow. Pt has a few pills left. [address] and [place],  [place], Group [id number], ID [id number] for Xarelto refills. Pt did not have patient card with him. Pt to receive medications from [facility]. Jonnie Finner RN3 CCM, Heart Failure TOC CM [phone]   147 PM HF TOC CM will not be able to use MATCH, pt used in June 2022. [place] and they will run medications to determine pt cost. Jonnie Finner RN3 CCM, Heart Failure TOC CM [phone]

## 2021-04-02 NOTE — TOC Initial Note (Addendum)
Transition of Care [facility]) - Initial/Assessment Note    Patient Details  Name: Gerald [name] MRN: [id number] Date of Birth: Jul 06, 1981  Transition of [facility]) CM/SW Contact:    Erenest Rasher, RN Phone Number: [phone] 04/02/2021, 11:48 AM  Clinical Narrative:                 TOC CM spoke to pt and states daughter lives in home. States he does go to [employer]. Has appt at [facility] 04/23/2021 at 1330. He gets meds from [place]. Will be able to provided MATCH at time of dc. Pt will received meds from [place] at dc. Pt reports he receives ride from family to get to his appts.   Expected Discharge Plan: Home/Self Care Barriers to Discharge: Continued Medical Work up   Patient Goals and CMS Choice        Expected Discharge Plan and Services Expected Discharge Plan: Home/Self Care In-house Referral: Clinical Social Work Discharge Planning Services: CM Consult   Living arrangements for the past 2 months: [place]   Prior Living Arrangements/Services Living arrangements for the past 2 months: Single Family Home Lives with:: Minor Children Patient language and need for interpreter reviewed:: Yes        Need for Family Participation in Patient Care: No (Comment) Care giver support system in place?: No (comment)   Criminal Activity/Legal Involvement Pertinent to Current Situation/Hospitalization: No - Comment as needed  Activities of Daily Living      Permission Sought/Granted Permission sought to share information with : Case Manager, Family Supports, PCP Permission granted to share information with : Yes, Verbal Permission Granted  Share Information with NAME: Elita Boone     Permission granted to share info w Relationship: mother  Permission granted to share info w Contact Information: [phone]  Emotional Assessment   Attitude/Demeanor/Rapport: Engaged Affect (typically  observed): Accepting Orientation: : Oriented to Self, Oriented to Place, Oriented to  Time, Oriented to Situation   Psych Involvement: No (comment)  Admission diagnosis:  CHF (congestive heart failure) ([name]) [I50.9] LBBB (left bundle branch block) [I44.7] Tachycardia [R00.0] Atypical pneumonia [J18.9] Chest pain [R07.9] Acute on chronic right-sided congestive heart failure ([place]) [I50.813] Patient Active Problem List   Diagnosis Date Noted   Acute diastolic CHF (congestive heart failure) ([place]) 03/30/2021   CHF (congestive heart failure) ([place]) 03/30/2021   Hypertensive crisis 01/20/2021   Polysubstance abuse ([place]) 01/20/2021   Class 2 obesity due to excess calories with body mass index (BMI) of 39.0 to 39.9 in adult 01/20/2021   Hypertensive emergency 12/21/2020   CKD (chronic kidney disease), stage III ([place]) 12/21/2020   Acute on chronic systolic CHF (congestive heart failure) ([place]) 07/05/2020   PAF (paroxysmal atrial fibrillation) ([place]) 07/05/2020   Elevated troponin 07/05/2020   Microcytic anemia 07/05/2020   Hypokalemia 07/05/2020   Malignant hypertension    Acute on chronic combined systolic and diastolic CHF (congestive heart failure) ([place])    Atrial flutter ([place])    Essential hypertension    Acute systolic congestive heart failure ([place])    Acute respiratory failure ([place]) 04/01/2019   NSTEMI (non-ST elevated myocardial infarction) ([place]) 12/21/2015   Atypical pneumonia 12/21/2015   PCP:  Patient, [name] (Inactive) Pharmacy:   [id number] [facility] [id number] [id number]: [phone]: [phone] MGershon Mussel  [facility] [address] [place] [address] Phone: [phone] Fax: [phone]  [id number] [place] [id number]: [phone]: [phone] [facility]  by [place]- [address]Phone: [phone]: [phone]    Social Determinants of Health (S[place]old Interventions    Readmission Risk Interventions No flowsheet data found.

## 2021-04-02 NOTE — Plan of Care (Signed)
  Problem: Education: Goal: Knowledge of General Education information will improve Description: Including pain rating scale, medication(s)/side effects and non-pharmacologic comfort measures Outcome: Adequate for Discharge   Problem: Health Behavior/Discharge Planning: Goal: Ability to manage health-related needs will improve Outcome: Adequate for Discharge   Problem: Clinical Measurements: Goal: Ability to maintain clinical measurements within normal limits will improve Outcome: Adequate for Discharge   Problem: Clinical Measurements: Goal: Respiratory complications will improve Outcome: Adequate for Discharge   Problem: Clinical Measurements: Goal: Cardiovascular complication will be avoided Outcome: Adequate for Discharge

## 2021-04-02 NOTE — Progress Notes (Addendum)
Heart Failure Stewardship Pharmacist Progress Note   PCP: Patient, [name] (Inactive) PCP-Cardiologist: Ida Rogue, MD    HPI:  40 yo M with PMH of CHF, HTN, afib, CKD IIIa, tobacco use, cocaine use, and non-compliance. He presented to the ED on 8/13 with shortness of breath and productive cough. CXR on admission suggestive of pulmonary edema. Last ECHO done on 01/20/21 and LVEF was 30-35%. Previously hospitalized in June 2022 with acute on chronic CHF. Was then seen in HF [facility] clinic and patient was able to get Entresto and Xarelto for free through patient assistance. He then cancelled his subsequent appt and has not been seen since.   Current HF Medications: Carvedilol 25 mg BID Entresto 49/51 mg BID Spironolactone 25 mg daily Farxiga 10 mg daily Hydralazine 100 mg q8h Imdur 30 mg daily  Prior to admission HF Medications: Furosemide 60 mg daily Carvedilol 25 mg BID Entresto 49/51 mg BID Spironolactone 25 mg daily Farxiga 10 mg daily Hydralazine 100 mg q8h Imdur 30 mg daily  Pertinent Lab Values: Serum creatinine 2.02, BUN 30, Potassium 3.2, Sodium 138, BNP >4500, Magnesium 1.9  Vital Signs: Weight: 276 lbs (admission weight: 287 lbs) Blood pressure: 130-140/90s  Heart rate: 70s   Medication Assistance / Insurance Benefits Check: Does the patient have prescription insurance?  No  Does the patient qualify for medication assistance through manufacturers or grants?   Yes Eligible grants and/or patient assistance programs: Lisabeth Register, Xarelto Medication assistance applications in progress: none  Medication assistance applications approved: Delene Loll, Xarelto Approved medication assistance renewals will be completed by: [place]:  [facility] Is the patient willing to use [place] at discharge? Yes Is the patient willing to transition their outpatient pharmacy to utilize a [facility] outpatient pharmacy?    Pending    Assessment: 1. Acute on chronic systolic CHF (EF [id number]), due to NICM. NYHA class III symptoms. - Conitnue carvedilol 25 mg BID - Continue Entresto 49/51 mg BID. Consider increasing to 97/103 mg BID - Continue spironolactone 25 mg daily - Continue Farxiga 10 mg daily - Continue hydralazine 100 mg q8h - Continue Imdur 30 mg daily   Plan: 1) Medication changes recommended at this time: - Increase Entresto to 97/103 mg BID  2) Patient assistance: - Has appt with Dr. Percival Spanish on 8/24 - will hold off scheduling TOC appt at this time unless he's unable to make that appt - Patient as already been approved for Xarelto and Entresto patient assistance - [facility] pharmacy providing Xarelto with patient assistance info - Free 30-day card provided for [place] - Patient has called [employer] for [employer] refill. Expected delivery tomorrow. - Consult to HF CSW/RNCM to follow up on insurance/Medicaid progress  3)  Education  - Patient has been educated on current HF medications and potential additions to HF medication regimen - Patient verbalizes understanding that over the next few months, these medication doses may change and more medications may be added to optimize HF regimen - Patient has been educated on basic disease state pathophysiology and goals of therapy   Kerby Nora, PharmD, BCPS Heart Failure Stewardship Pharmacist Phone [phone]

## 2021-04-02 NOTE — Discharge Summary (Addendum)
Physician Discharge Summary  Gerald [name] [id number] DOB: 06-10-1981 DOA: 03/30/2021  PCP: Patient, [name] (Inactive)  Admit date: 03/30/2021 Discharge date: 04/02/2021  Admitted From: Home  Disposition:   Home   Recommendations for Outpatient Follow-up and new medication changes:  Follow up with Primary care as scheduled on 04/23/21 Follow up with cardiology as scheduled.  Patient was advised about compliance with his medications.  Follow-up kidney function as an outpatient.  Home Health: no   Equipment/Devices: no    Discharge Condition: stable CODE STATUS: full  Diet recommendation:  heart heathy and diabetic prudent.  Brief/Interim Summary: Gerald [name] was admitted to the hospital with the working diagnosis of acute systolic heart failure exacerbation.    40 yo male with the past medical history of systolic heart failure, paroxysmal atrial fibrillation, hypertension, polysubstance abuse, chronic kidney disease stage IIIb, type 2 diabetes mellitus and obesity class [name] who presented with cough and dyspnea.  Multiple prior hospitalization, noted to have poor medical compliance, run out of his medications at home.  4 days leading to his hospitalization he had productive cough, pink frothy sputum, burning chest pain that prompted him to come to the hospital. On his initial physical examination his blood pressure was 180/143, heart rate 108, respiratory rate 31, oxygen saturation 95%.  His lungs had rales bilaterally, no wheezing, heart S1-S2, present, rhythmic, abdomen soft, positive lower extremity edema   Sodium 137, potassium 2.9, chloride 106, bicarb 21, glucose 113, BUN 19, creatinine 2.0, BNP > 4500, high sensitive troponin 588, 517, 535.  White count 8.1, hemoglobin 10.8, hematocrit 35.3, platelets 208. SARS COVID-19 negative.   Chest film with bilateral interstitial infiltrates, positive cardiomegaly   CT chest with multiple bilateral infiltrates, dense centrally and  more ground glass at the periphery, positive cystic lesion the peripheral right lower lobe.  No pulmonary embolism.    EKG 110 bpm, left axis deviation, left bundle branch block, sinus rhythm, Q wave V1 to V3, no significant st segment or t wave changes.    Patient placed on IV furosemide with improvement in volume status.   Acute on chronic systolic heart failure decompensation.  Hypertensive emergency. Patient was admitted to the cardiac ward, he received intravenous furosemide with good toleration. Negative fluid balance was achieved, -5768.9 mL with significant improvement of his symptoms.  He is known to have nonischemic cardiomyopathy, cardiac catheterization 5/22 with nonobstructive coronaries.  Elevated troponin likely due to heart failure decompensation.  His echocardiogram showed ejection fraction of 30 to 35% with global hypokinesis, preserved RV systolic function, severe left atrial dilatation.  Heart failure regimen was resumed with carvedilol, hydralazine, isosorbide, Entresto, spironolactone and dapagliflozin. At home patient was not taking his medications, for now will resume same dosage with no adjustments. He was advised about importance of medical compliance.  Patient will follow-up with cardiology as an outpatient.  2.  Acute pulmonary edema due to decompensated heart failure.  Patient had radiographic follow-up, he does have changes seen on CT likely due to pulmonary edema. Repeat chest radiograph on day of discharge with significant improvement.  Pneumonia ruled out.  3.  Chronic kidney disease stage IIIb, hypokalemia, hypomagnesemia.  Patient's cognitive function remained stable, tolerated well aggressive diuresis with intravenous furosemide. His electrolytes were corrected with potassium chloride and magnesium sulfate.  At discharge sodium 138, potassium 3.2, chloride 105, bicarb 22, glucose 113, BUN 30, creatinine 2.0. He will received 80 mEq of potassium chloride  before discharge. Follow-up kidney function as  an outpatient.  4.  Paroxysmal atrial fibrillation.  Rate remains well controlled with carvedilol, anticoagulation with rivaroxaban. He remains sinus rhythm.  5.  Substance abuse.  No withdrawal symptoms.  6.  Type 2 diabetes mellitus.  Dyslipidemia.  His glucose remained stable during his hospitalization.  He received insulin sliding scale for glucose coverage and monitoring. Continue atorvastatin.  7.  Obesity class [name].  His calculated BMI is 38.8, follow-up as an outpatient.   Discharge Diagnoses:  Principal Problem:   Acute diastolic CHF (congestive heart failure) ([name]) Active Problems:   Atrial flutter ([name])   Essential hypertension   Hypokalemia   CKD (chronic kidney disease), stage III ([name])   Polysubstance abuse ([place])    Discharge Instructions  Discharge Instructions     Diet - low sodium heart healthy   Complete by: As directed    Discharge instructions   Complete by: As directed    Please follow up with primary care in 7 to 10 days.   Increase activity slowly   Complete by: As directed       Allergies as of 04/02/2021   No Known Allergies      Medication List     TAKE these medications    aspirin 81 MG EC tablet Take 1 tablet (81 mg total) by mouth daily. Swallow whole.   atorvastatin 40 MG tablet Commonly known as: LIPITOR Take 1 tablet (40 mg total) by mouth daily.   carvedilol 25 MG tablet Commonly known as: COREG Take 1 tablet (25 mg total) by mouth 2 (two) times daily with a meal.   dapagliflozin propanediol 10 MG Tabs tablet Commonly known as: Farxiga Take 1 tablet (10 mg total) by mouth daily before breakfast.   Entresto 49-51 MG Generic drug: sacubitril-valsartan Take 1 tablet by mouth 2 (two) times daily.   furosemide 40 MG tablet Commonly known as: LASIX Take 1.5 tablets (60 mg total) by mouth daily.   hydrALAZINE 100 MG tablet Commonly known as: APRESOLINE Take 1 tablet (100  mg total) by mouth every 8 (eight) hours.   isosorbide mononitrate 30 MG 24 hr tablet Commonly known as: IMDUR Take 1 tablet (30 mg total) by mouth daily.   potassium chloride 10 MEQ tablet Commonly known as: KLOR-CON Take 1 tablet (10 mEq total) by mouth daily.   rivaroxaban 20 MG Tabs tablet Commonly known as: XARELTO Take 1 tablet (20 mg total) by mouth daily with supper.   spironolactone 25 MG tablet Commonly known as: ALDACTONE Take 1 tablet (25 mg total) by mouth daily for 30 doses.        Follow-up Information     [facility] Follow up on 04/23/2021.   Why: 1:30 for hospital follow up and establish new PCP with [facility] Contact information: [address], [address] [place] [phone]               No Known Allergies   Procedures/Studies: CT Angio Chest PE W and/or Wo Contrast  Result Date: 03/30/2021 CLINICAL DATA:  40 year old male with shortness of breath. Negative for COVID-19. Today. EXAM: CT ANGIOGRAPHY CHEST WITH CONTRAST TECHNIQUE: Multidetector CT imaging of the chest was performed using the standard protocol during bolus administration of intravenous contrast. Multiplanar CT image reconstructions and MIPs were obtained to evaluate the vascular anatomy. CONTRAST:  130m OMNIPAQUE IOHEXOL 350 MG/ML SOLN COMPARISON:  Portable chest 0814 hours today. FINDINGS: Cardiovascular: Good contrast bolus timing in the pulmonary arterial tree. No focal filling  defect identified in the pulmonary arteries to suggest acute pulmonary embolism. Cardiomegaly is moderate to severe (series 7, image 285). No pericardial effusion. No contrast in the aorta. Mediastinum/Nodes: Reactive appearing mediastinal and hilar lymph nodes, most less than 10 mm. Lungs/Pleura: Major airways are patent. There is a small area of cystic lung disease in the right lower lobe series 6, image 91, but no generalized emphysema or bullous changes. Widespread Patchy  and confluent pulmonary opacity ranging from sub solid to early consolidation, scattered in a peribronchial and occasionally subpleural pattern throughout both lungs. No air bronchograms at this time. No pleural effusion. Upper Abdomen: Some contrast reflux into the hepatic IVC and hepatic veins. Otherwise negative visible liver, spleen, left adrenal gland, pancreas and stomach. Musculoskeletal: No osseous abnormality identified. Review of the MIP images confirms the above findings. IMPRESSION: 1. Negative for acute pulmonary embolus. 2. Cardiomegaly with widely scattered bilateral peribronchial opacity ranging from ground-glass to early consolidation. Reactive appearing mediastinal and hilar lymph nodes. And although there does appear to be a degree of right heart failure, the absence of pleural fluid argues against asymmetric pulmonary edema. Differential diagnosis is broad but includes bronchopneumonias including mycoplasma, adenovirus, influenza, PJP (if immunocompromised). But a non-infectious alveolar inflammatory process or vasculitis might also have this appearance. Electronically Signed   By: Genevie Ann M.D.   On: 03/30/2021 12:01   DG Chest Port 1 View  Result Date: 04/02/2021 CLINICAL DATA:  Heart failure.  Follow-up exam. EXAM: PORTABLE CHEST 1 VIEW COMPARISON:  03/30/2021 FINDINGS: Improved aeration of the lungs and suggestive for decreased pulmonary edema. Stable enlargement of the cardiac silhouette. The trachea is midline. Negative for a pneumothorax. No acute bone abnormality. IMPRESSION: 1. Improved aeration in both lungs. Findings are suggestive for decreased or resolved pulmonary edema. 2. Stable cardiomegaly. Electronically Signed   By: Markus Daft M.D.   On: 04/02/2021 08:07   DG Chest Port 1 View  Result Date: 03/30/2021 CLINICAL DATA:  Chest pain, short of breath EXAM: PORTABLE CHEST 1 VIEW COMPARISON:  01/20/2021 FINDINGS: Stable large cardiac silhouette. Bilateral airspace disease  improved from 01/20/2021. No pneumothorax. No pleural fluid IMPRESSION: Bilateral airspace disease suggest pulmonary edema. Improvement from comparison exam. Electronically Signed   By: Suzy Bouchard M.D.   On: 03/30/2021 09:00      Subjective: Patient is feeling better, no nausea or vomiting, dyspnea and lower extremity edema have resolved.    Discharge Exam: Vitals:   04/02/21 0624 04/02/21 1102  BP: (!) 138/111 134/90  Pulse: 73 74  Resp: 18 19  Temp: 98.4 F (36.9 C) 98.5 F (36.9 C)  SpO2: 97% 98%   Vitals:   04/02/21 0548 04/02/21 0622 04/02/21 0624 04/02/21 1102  BP:  (!) 138/111 (!) 138/111 134/90  Pulse:   73 74  Resp:   18 19  Temp:   98.4 F (36.9 C) 98.5 F (36.9 C)  TempSrc:   Oral Oral  SpO2:   97% 98%  Weight: 125.5 kg     Height:        General: Not in pain or dyspnea.  Neurology: Awake and alert, non focal  E ENT: no pallor, no icterus, oral mucosa moist Cardiovascular: No JVD. S1-S2 present, rhythmic, no gallops, rubs, or murmurs. No lower extremity edema. Pulmonary: positive breath sounds bilaterally, with no wheezing, rhonchi or rales. Gastrointestinal. Abdomen soft and non tender Skin. No rashes Musculoskeletal: no joint deformities   The results of significant diagnostics from this hospitalization (including imaging,  microbiology, ancillary and laboratory) are listed below for reference.     Microbiology: Recent Results (from the past 240 hour(s))  Resp Panel by RT-PCR (Flu A&B, Covid) Nasopharyngeal Swab     Status: None   Collection Time: 03/30/21  8:54 AM   Specimen: Nasopharyngeal Swab; Nasopharyngeal(NP) swabs in vial transport medium  Result Value Ref Range Status   SARS Coronavirus 2 by RT PCR NEGATIVE NEGATIVE Final    Comment: (NOTE) SARS-CoV-2 target nucleic acids are NOT DETECTED.  The SARS-CoV-2 RNA is generally detectable in upper respiratory specimens during the acute phase of infection. The lowest concentration of  SARS-CoV-2 viral copies this assay can detect is 138 copies/mL. A negative result does not preclude SARS-Cov-2 infection and should not be used as the sole basis for treatment or other patient management decisions. A negative result may occur with  improper specimen collection/handling, submission of specimen other than nasopharyngeal swab, presence of viral mutation(s) within the areas targeted by this assay, and inadequate number of viral copies(<138 copies/mL). A negative result must be combined with clinical observations, patient history, and epidemiological information. The expected result is Negative.  Fact Sheet for Patients:  EntrepreneurPulse.com.au  Fact Sheet for Healthcare Providers:  IncredibleEmployment.be  This test is no t yet approved or cleared by the [place] FDA and  has been authorized for detection and/or diagnosis of SARS-CoV-2 by FDA under an Emergency Use Authorization (EUA). This EUA will remain  in effect (meaning this test can be used) for the duration of the COVID-19 declaration under Section 564(b)(1) of the Act, 21 U.S.C.section 360bbb-3(b)(1), unless the authorization is terminated  or revoked sooner.       Influenza A by PCR NEGATIVE NEGATIVE Final   Influenza B by PCR NEGATIVE NEGATIVE Final    Comment: (NOTE) The Xpert Xpress SARS-CoV-2/FLU/RSV plus assay is intended as an aid in the diagnosis of influenza from Nasopharyngeal swab specimens and should not be used as a sole basis for treatment. Nasal washings and aspirates are unacceptable for Xpert Xpress SARS-CoV-2/FLU/RSV testing.  Fact Sheet for Patients: EntrepreneurPulse.com.au  Fact Sheet for Healthcare Providers: IncredibleEmployment.be  This test is not yet approved or cleared by the [place] FDA and has been authorized for detection and/or diagnosis of SARS-CoV-2 by FDA under an Emergency Use  Authorization (EUA). This EUA will remain in effect (meaning this test can be used) for the duration of the COVID-19 declaration under Section 564(b)(1) of the Act, 21 U.S.C. section 360bbb-3(b)(1), unless the authorization is terminated or revoked.  Performed at [facility], [place] [address], [place], [place] [address]   Blood culture (routine x 2)     Status: None (Preliminary result)   Collection Time: 03/30/21  9:30 AM   Specimen: BLOOD LEFT HAND  Result Value Ref Range Status   Specimen Description BLOOD LEFT HAND  Final   Special Requests   Final    BOTTLES DRAWN AEROBIC AND ANAEROBIC Blood Culture results may not be optimal due to an inadequate volume of blood received in culture bottles   Culture   Final    NO GROWTH 2 DAYS Performed at [facility], [place] [address], [place], [place] [address]    Report Status PENDING  Incomplete  Blood culture (routine x 2)     Status: None (Preliminary result)   Collection Time: 03/30/21  9:32 AM   Specimen: BLOOD RIGHT HAND  Result Value Ref Range Status   Specimen Description BLOOD RIGHT HAND  Final   Special Requests   Final    BOTTLES DRAWN AEROBIC AND ANAEROBIC Blood Culture adequate volume   Culture   Final    NO GROWTH 2 DAYS Performed at [facility], [address] [place], [place] [address]    Report Status PENDING  Incomplete  Expectorated Sputum Assessment w Gram Stain, Rflx to Resp Cult     Status: None   Collection Time: 03/31/21  7:34 PM   Specimen: Sputum  Result Value Ref Range Status   Specimen Description SPUTUM  Final   Special Requests NONE  Final   Sputum evaluation   Final    THIS SPECIMEN IS ACCEPTABLE FOR SPUTUM CULTURE Performed at [facility], [place] [address], [place], [place] [address]    Report Status 04/01/2021 FINAL  Final  Culture, Respiratory w Gram Stain     Status: None (Preliminary result)   Collection Time: 03/31/21  7:34 PM   Specimen: SPU  Result Value Ref Range  Status   Specimen Description SPUTUM  Final   Special Requests NONE Reflexed from [id number]  Final   Gram Stain   Final    RARE WBC PRESENT, PREDOMINANTLY PMN FEW GRAM POSITIVE COCCI IN PAIRS IN CHAINS RARE GRAM NEGATIVE RODS    Culture   Final    CULTURE REINCUBATED FOR BETTER GROWTH Performed at [facility], [place] [address] [place], [place] [address]    Report Status PENDING  Incomplete     Labs: BNP (last 3 results) Recent Labs    12/21/20 0722 01/20/21 0837 03/30/21 0858  BNP 2,309.0* 2,466.4* [id number]*   Basic Metabolic Panel: Recent Labs  Lab 03/30/21 0730 03/31/21 0605 04/01/21 0355 04/02/21 0418  NA 137 137 138 138  K 2.9* 2.9* 3.2* 3.2*  CL 106 106 104 105  CO2 21* 21* 23 22  GLUCOSE 113* 108* 88 113*  BUN 19 20 25* 30*  CREATININE 2.05* 1.90* 2.00* 2.02*  CALCIUM 9.1 8.5* 8.7* 9.0  MG  --   --  1.9  --    Liver Function Tests: No results for input(s): AST, ALT, ALKPHOS, BILITOT, PROT, ALBUMIN in the last 168 hours. No results for input(s): LIPASE, AMYLASE in the last 168 hours. No results for input(s): AMMONIA in the last 168 hours. CBC: Recent Labs  Lab 03/30/21 0730 03/30/21 0900 03/30/21 2025 03/31/21 0605  WBC 8.1 8.1  --  7.1  NEUTROABS  --  6.1  --  4.9  HGB 10.8* 11.0* 10.9* 10.5*  HCT 35.3* 35.6* 34.2* 33.0*  MCV 70.0* 69.7*  --  68.6*  PLT 208 203  --  197   Cardiac Enzymes: No results for input(s): CKTOTAL, CKMB, CKMBINDEX, TROPONINI in the last 168 hours. BNP: Invalid input(s): POCBNP CBG: Recent Labs  Lab 04/01/21 1151 04/01/21 1641 04/01/21 2129 04/02/21 0624 04/02/21 1058  GLUCAP 113* 141* 115* 118* 123*   D-Dimer No results for input(s): DDIMER in the last 72 hours. Hgb A1c Recent Labs    03/30/21 2025  HGBA1C 6.2*   Lipid Profile No results for input(s): CHOL, HDL, LDLCALC, TRIG, CHOLHDL, LDLDIRECT in the last 72 hours. Thyroid function studies No results for input(s): TSH, T4TOTAL, T3FREE, THYROIDAB in  the last 72 hours.  Invalid input(s): FREET3 Anemia work up No results for input(s): VITAMINB12, FOLATE, FERRITIN, TIBC, IRON, RETICCTPCT in the last 72 hours. Urinalysis No results found for: COLORURINE, APPEARANCEUR, [place], [place], [place], [place], [place], [place], [place], [place], NITRITE, LEUKOCYTESUR  Sepsis Labs Invalid input(s): PROCALCITONIN,  WBC,  LACTICIDVEN Microbiology Recent Results (from the past 240 hour(s))  Resp Panel by RT-PCR (Flu A&B, Covid) Nasopharyngeal Swab     Status: None   Collection Time: 03/30/21  8:54 AM   Specimen: Nasopharyngeal Swab; Nasopharyngeal(NP) swabs in vial transport medium  Result Value Ref Range Status   SARS Coronavirus 2 by RT PCR NEGATIVE NEGATIVE Final    Comment: (NOTE) SARS-CoV-2 target nucleic acids are NOT DETECTED.  The SARS-CoV-2 RNA is generally detectable in upper respiratory specimens during the acute phase of infection. The lowest concentration of SARS-CoV-2 viral copies this assay can detect is 138 copies/mL. A negative result does not preclude SARS-Cov-2 infection and should not be used as the sole basis for treatment or other patient management decisions. A negative result may occur with  improper specimen collection/handling, submission of specimen other than nasopharyngeal swab, presence of viral mutation(s) within the areas targeted by this assay, and inadequate number of viral copies(<138 copies/mL). A negative result must be combined with clinical observations, patient history, and epidemiological information. The expected result is Negative.  Fact Sheet for Patients:  EntrepreneurPulse.com.au  Fact Sheet for Healthcare Providers:  IncredibleEmployment.be  This test is no t yet approved or cleared by the [place] FDA and  has been authorized for detection and/or diagnosis of SARS-CoV-2 by FDA under an Emergency Use Authorization (EUA). This EUA will  remain  in effect (meaning this test can be used) for the duration of the COVID-19 declaration under Section 564(b)(1) of the Act, 21 U.S.C.section 360bbb-3(b)(1), unless the authorization is terminated  or revoked sooner.       Influenza A by PCR NEGATIVE NEGATIVE Final   Influenza B by PCR NEGATIVE NEGATIVE Final    Comment: (NOTE) The Xpert Xpress SARS-CoV-2/FLU/RSV plus assay is intended as an aid in the diagnosis of influenza from Nasopharyngeal swab specimens and should not be used as a sole basis for treatment. Nasal washings and aspirates are unacceptable for Xpert Xpress SARS-CoV-2/FLU/RSV testing.  Fact Sheet for Patients: EntrepreneurPulse.com.au  Fact Sheet for Healthcare Providers: IncredibleEmployment.be  This test is not yet approved or cleared by the [place] FDA and has been authorized for detection and/or diagnosis of SARS-CoV-2 by FDA under an Emergency Use Authorization (EUA). This EUA will remain in effect (meaning this test can be used) for the duration of the COVID-19 declaration under Section 564(b)(1) of the Act, 21 U.S.C. section 360bbb-3(b)(1), unless the authorization is terminated or revoked.  Performed at [facility], [place] [address], [place], [place] [address]   Blood culture (routine x 2)     Status: None (Preliminary result)   Collection Time: 03/30/21  9:30 AM   Specimen: BLOOD LEFT HAND  Result Value Ref Range Status   Specimen Description BLOOD LEFT HAND  Final   Special Requests   Final    BOTTLES DRAWN AEROBIC AND ANAEROBIC Blood Culture results may not be optimal due to an inadequate volume of blood received in culture bottles   Culture   Final    NO GROWTH 2 DAYS Performed at [facility], [place] [address], [place], [place] [address]    Report Status PENDING  Incomplete  Blood culture (routine x 2)     Status: None (Preliminary result)   Collection Time: 03/30/21  9:32 AM    Specimen: BLOOD RIGHT HAND  Result Value Ref Range Status   Specimen Description BLOOD RIGHT HAND  Final   Special  Requests   Final    BOTTLES DRAWN AEROBIC AND ANAEROBIC Blood Culture adequate volume   Culture   Final    NO GROWTH 2 DAYS Performed at [facility], [place] [address] [place], [place] [address]    Report Status PENDING  Incomplete  Expectorated Sputum Assessment w Gram Stain, Rflx to Resp Cult     Status: None   Collection Time: 03/31/21  7:34 PM   Specimen: Sputum  Result Value Ref Range Status   Specimen Description SPUTUM  Final   Special Requests NONE  Final   Sputum evaluation   Final    THIS SPECIMEN IS ACCEPTABLE FOR SPUTUM CULTURE Performed at [facility], [place] [address] [place], [place] [address]    Report Status 04/01/2021 FINAL  Final  Culture, Respiratory w Gram Stain     Status: None (Preliminary result)   Collection Time: 03/31/21  7:34 PM   Specimen: SPU  Result Value Ref Range Status   Specimen Description SPUTUM  Final   Special Requests NONE Reflexed from [id number]  Final   Gram Stain   Final    RARE WBC PRESENT, PREDOMINANTLY PMN FEW GRAM POSITIVE COCCI IN PAIRS IN CHAINS RARE GRAM NEGATIVE RODS    Culture   Final    CULTURE REINCUBATED FOR BETTER GROWTH Performed at [facility], [place] [address] [place], [place] [address]    Report Status PENDING  Incomplete     Time coordinating discharge: 45 minutes  SIGNED:   Tawni Millers, MD  [facility] 04/02/2021, 1:20 PM

## 2021-04-02 NOTE — Plan of Care (Signed)
Problem: Education: Goal: Knowledge of General Education information will improve Description: Including pain rating scale, medication(s)/side effects and non-pharmacologic comfort measures 04/02/2021 1351 by Augusto Garbe, RN Outcome: Adequate for Discharge 04/02/2021 1351 by Augusto Garbe, RN Outcome: Progressing   Problem: Health Behavior/Discharge Planning: Goal: Ability to manage health-related needs will improve 04/02/2021 1351 by Augusto Garbe, RN Outcome: Adequate for Discharge 04/02/2021 1351 by Augusto Garbe, RN Outcome: Progressing   Problem: Clinical Measurements: Goal: Ability to maintain clinical measurements within normal limits will improve 04/02/2021 1351 by Augusto Garbe, RN Outcome: Adequate for Discharge 04/02/2021 1351 by Augusto Garbe, RN Outcome: Progressing Goal: Will remain free from infection 04/02/2021 1351 by Augusto Garbe, RN Outcome: Adequate for Discharge 04/02/2021 1351 by Augusto Garbe, RN Outcome: Progressing Goal: Diagnostic test results will improve 04/02/2021 1351 by Augusto Garbe, RN Outcome: Adequate for Discharge 04/02/2021 1351 by Augusto Garbe, RN Outcome: Progressing Goal: Respiratory complications will improve 04/02/2021 1351 by Augusto Garbe, RN Outcome: Adequate for Discharge 04/02/2021 1351 by Augusto Garbe, RN Outcome: Progressing Goal: Cardiovascular complication will be avoided 04/02/2021 1351 by Augusto Garbe, RN Outcome: Adequate for Discharge 04/02/2021 1351 by Augusto Garbe, RN Outcome: Progressing   Problem: Activity: Goal: Risk for activity intolerance will decrease 04/02/2021 1351 by Augusto Garbe, RN Outcome: Adequate for Discharge 04/02/2021 1351 by Augusto Garbe, RN Outcome: Progressing   Problem: Nutrition: Goal: Adequate nutrition will be maintained 04/02/2021 1351 by Augusto Garbe, RN Outcome: Adequate for Discharge 04/02/2021 1351 by Augusto Garbe, RN Outcome: Progressing   Problem: Coping: Goal: Level of anxiety will decrease 04/02/2021 1351 by Augusto Garbe, RN Outcome: Adequate for  Discharge 04/02/2021 1351 by Augusto Garbe, RN Outcome: Progressing   Problem: Elimination: Goal: Will not experience complications related to bowel motility 04/02/2021 1351 by Augusto Garbe, RN Outcome: Adequate for Discharge 04/02/2021 1351 by Augusto Garbe, RN Outcome: Progressing Goal: Will not experience complications related to urinary retention 04/02/2021 1351 by Augusto Garbe, RN Outcome: Adequate for Discharge 04/02/2021 1351 by Augusto Garbe, RN Outcome: Progressing   Problem: Pain Managment: Goal: General experience of comfort will improve 04/02/2021 1351 by Augusto Garbe, RN Outcome: Adequate for Discharge 04/02/2021 1351 by Augusto Garbe, RN Outcome: Progressing   Problem: Safety: Goal: Ability to remain free from injury will improve 04/02/2021 1351 by Augusto Garbe, RN Outcome: Adequate for Discharge 04/02/2021 1351 by Augusto Garbe, RN Outcome: Progressing   Problem: Skin Integrity: Goal: Risk for impaired skin integrity will decrease 04/02/2021 1351 by Augusto Garbe, RN Outcome: Adequate for Discharge 04/02/2021 1351 by Augusto Garbe, RN Outcome: Progressing   Problem: Education: Goal: Ability to demonstrate management of disease process will improve 04/02/2021 1351 by Augusto Garbe, RN Outcome: Adequate for Discharge 04/02/2021 1351 by Augusto Garbe, RN Outcome: Progressing Goal: Ability to verbalize understanding of medication therapies will improve 04/02/2021 1351 by Augusto Garbe, RN Outcome: Adequate for Discharge 04/02/2021 1351 by Augusto Garbe, RN Outcome: Progressing Goal: Individualized Educational Video(s) 04/02/2021 1351 by Augusto Garbe, RN Outcome: Adequate for Discharge 04/02/2021 1351 by Augusto Garbe, RN Outcome: Progressing   Problem: Activity: Goal: Capacity to carry out activities will improve 04/02/2021 1351 by Augusto Garbe, RN Outcome: Adequate for Discharge 04/02/2021 1351 by Augusto Garbe, RN Outcome: Progressing   Problem: Cardiac: Goal: Ability to achieve and maintain adequate cardiopulmonary perfusion  will improve 04/02/2021 1351 by Augusto Garbe, RN Outcome: Adequate for Discharge 04/02/2021 1351 by Augusto Garbe, RN Outcome: Progressing   Problem: Activity: Goal: Ability to tolerate increased activity will improve 04/02/2021 1351 by Augusto Garbe, RN  Outcome: Adequate for Discharge 04/02/2021 1351 by Augusto Garbe, RN Outcome: Progressing   Problem: Clinical Measurements: Goal: Ability to maintain a body temperature in the normal range will improve 04/02/2021 1351 by Augusto Garbe, RN Outcome: Adequate for Discharge 04/02/2021 1351 by Augusto Garbe, RN Outcome: Progressing   Problem: Respiratory: Goal: Ability to maintain adequate ventilation will improve 04/02/2021 1351 by Augusto Garbe, RN Outcome: Adequate for Discharge 04/02/2021 1351 by Augusto Garbe, RN Outcome: Progressing Goal: Ability to maintain a clear airway will improve 04/02/2021 1351 by Augusto Garbe, RN Outcome: Adequate for Discharge 04/02/2021 1351 by Augusto Garbe, RN Outcome: Progressing

## 2021-04-03 LAB — CULTURE, RESPIRATORY W GRAM STAIN: Culture: NORMAL

## 2021-04-04 LAB — CULTURE, BLOOD (ROUTINE X 2)
Culture: NO GROWTH
Culture: NO GROWTH
Special Requests: ADEQUATE

## 2021-04-10 ENCOUNTER — Ambulatory Visit: Payer: Self-pay | Admitting: Cardiology

## 2021-04-10 NOTE — Progress Notes (Deleted)
Cardiology Office Note   Date:  04/10/2021   ID:  Gerald [name], DOB 06-13-1981, MRN [id number]  PCP:  Patient, [name] (Inactive)  Cardiologist:   Ida Rogue, MD Referring:  ***  No chief complaint on file.     History of Present Illness: Gerald [name] is a 40 y.o. male who presents for ***    He was in the emergency room recently with cough.  He had hypertensive urgency.  He was admitted to the hospital.  I reviewed these records for this visit.  His BNP was greater than 4500.  Troponins were elevated but flat in the 500s.  He was treated with IV Lasix.  He did have an echocardiogram with global hypokinesis and an EF of 30 to 35%.  His issues were thought to be secondary to the fact that he had run out of his medicines and nonadherence.  He did have paroxysmal atrial fibrillation.  He is being treated with rivaroxaban.  He has a history of substance abuse.  He has obesity.  Of note he had a cardiac catheterization in May and he had no coronary disease.  He had a moderately elevated EDP at 24.   Past Medical History:  Diagnosis Date   Arrhythmia    CHF (congestive heart failure) ([name])    Dysrhythmia    brief episode afib, cardioverted did not return   Hypertension    Polysubstance abuse [facility])     Past Surgical History:  Procedure Laterality Date   KNEE SURGERY Right    LEFT HEART CATH AND CORONARY ANGIOGRAPHY N/A 12/21/2020   Procedure: LEFT HEART CATH AND CORONARY ANGIOGRAPHY;  Surgeon: Martinique, Peter M, MD;  Location: [place] [facility];  Service: Cardiovascular;  Laterality: N/A;   TEE WITHOUT CARDIOVERSION N/A 04/04/2019   Procedure: TRANSESOPHAGEAL ECHOCARDIOGRAM (TEE);  Surgeon: [name], MD;  Location: [facility] ORS;  Service: Cardiovascular;  Laterality: N/A;     Current Outpatient Medications  Medication Sig Dispense Refill   aspirin 81 MG EC tablet Take 1 tablet (81 mg total) by mouth daily. Swallow whole. 30 tablet 0   atorvastatin (LIPITOR) 40  MG tablet Take 1 tablet (40 mg total) by mouth daily. 30 tablet 0   carvedilol (COREG) 25 MG tablet Take 1 tablet (25 mg total) by mouth 2 (two) times daily with a meal. 60 tablet 0   dapagliflozin propanediol (FARXIGA) 10 MG TABS tablet Take 1 tablet (10 mg total) by mouth daily before breakfast. 30 tablet 0   furosemide (LASIX) 40 MG tablet Take 1.5 tablets (60 mg total) by mouth daily. 60 tablet 0   hydrALAZINE (APRESOLINE) 100 MG tablet Take 1 tablet (100 mg total) by mouth every 8 (eight) hours. 90 tablet 0   isosorbide mononitrate (IMDUR) 30 MG 24 hr tablet Take 1 tablet (30 mg total) by mouth daily. 30 tablet 0   potassium chloride (KLOR-CON) 10 MEQ tablet Take 1 tablet (10 mEq total) by mouth daily. 30 tablet 0   rivaroxaban (XARELTO) 20 MG TABS tablet Take 1 tablet (20 mg total) by mouth daily with supper. 30 tablet 0   sacubitril-valsartan (ENTRESTO) 49-51 MG Take 1 tablet by mouth 2 (two) times daily. 60 tablet 0   spironolactone (ALDACTONE) 25 MG tablet Take 1 tablet (25 mg total) by mouth daily for 30 doses. 30 tablet 0   No current facility-administered medications for this visit.    Allergies:   Patient has no known  allergies.    Social History:  The patient  reports that he has been smoking cigarettes. He has a 21.00 pack-year smoking history. He has never used smokeless tobacco. He reports current alcohol use. He reports current drug use. Drugs: Cocaine and Marijuana.   Family History:  The patient's ***family history includes Heart disease in his paternal grandfather; Hypertension in his father.    ROS:  Please see the history of present illness.   Otherwise, review of systems are positive for {NONE DEFAULTED:18576}.   All other systems are reviewed and negative.    PHYSICAL EXAM: VS:  There were no vitals taken for this visit. , BMI There is no height or weight on file to calculate BMI. GENERAL:  Well appearing HEENT:  Pupils equal round and reactive, fundi not  visualized, oral mucosa unremarkable NECK:  No jugular venous distention, waveform within normal limits, carotid upstroke brisk and symmetric, no bruits, no thyromegaly LYMPHATICS:  No cervical, inguinal adenopathy LUNGS:  Clear to auscultation bilaterally BACK:  No CVA tenderness CHEST:  Unremarkable HEART:  PMI not displaced or sustained,S1 and S2 within normal limits, no S3, no S4, no clicks, no rubs, *** murmurs ABD:  Flat, positive bowel sounds normal in frequency in pitch, no bruits, no rebound, no guarding, no midline pulsatile mass, no hepatomegaly, no splenomegaly EXT:  2 plus pulses throughout, no edema, no cyanosis no clubbing SKIN:  No rashes no nodules NEURO:  Cranial nerves [name] through XII grossly intact, motor grossly intact throughout PSYCH:  Cognitively intact, oriented to person place and time    EKG:  EKG {ACTION; IS/IS [id number] ordered today. The ekg ordered today demonstrates ***   Recent Labs: 12/23/2020: ALT 36 03/30/2021: B Natriuretic Peptide >4,500.0 03/31/2021: Hemoglobin 10.5; Platelets 197 04/01/2021: Magnesium 1.9 04/02/2021: BUN 30; Creatinine, Ser 2.02; Potassium 3.2; Sodium 138    Lipid Panel    Component Value Date/Time   CHOL 163 01/20/2021 1622   TRIG 61 01/20/2021 1622   HDL 44 01/20/2021 1622   CHOLHDL 3.7 01/20/2021 1622   VLDL 12 01/20/2021 1622   LDLCALC 107 (H) 01/20/2021 1622      Wt Readings from Last 3 Encounters:  04/02/21 276 lb 10.8 oz (125.5 kg)  01/29/21 283 lb 6 oz (128.5 kg)  01/23/21 275 lb 6.4 oz (124.9 kg)      Other studies Reviewed: Additional studies/ records that were reviewed today include: ***. Review of the above records demonstrates:  Please see elsewhere in the note.  ***   ASSESSMENT AND PLAN:  Acute systolic heart failure:***  Hypertension:  ***  PAF: ***    Current medicines are reviewed at length with the patient today.  The patient {ACTIONS; HAS/DOES NOT HAVE:19233} concerns regarding  medicines.  The following changes have been made:  {PLAN; NO CHANGE:13088:s}  Labs/ tests ordered today include: *** No orders of the defined types were placed in this encounter.    Disposition:   FU with ***    Signed, Minus Breeding, MD  04/10/2021 12:05 PM    [facility]

## 2021-04-15 ENCOUNTER — Other Ambulatory Visit (HOSPITAL_COMMUNITY): Payer: Self-pay

## 2021-04-23 ENCOUNTER — Inpatient Hospital Stay: Payer: Self-pay | Admitting: Family Medicine

## 2021-05-09 ENCOUNTER — Inpatient Hospital Stay (INDEPENDENT_AMBULATORY_CARE_PROVIDER_SITE_OTHER): Payer: Self-pay | Admitting: Primary Care

## 2021-05-20 ENCOUNTER — Telehealth: Payer: Self-pay | Admitting: *Deleted

## 2021-05-24 NOTE — Progress Notes (Signed)
Erroneous encounter. Provider called and left voicemail x 3 without response.

## 2021-05-28 ENCOUNTER — Encounter: Payer: Self-pay | Admitting: Family

## 2021-05-28 ENCOUNTER — Other Ambulatory Visit: Payer: Self-pay

## 2021-05-30 ENCOUNTER — Inpatient Hospital Stay (HOSPITAL_COMMUNITY)
Admission: EM | Admit: 2021-05-30 | Discharge: 2021-06-03 | DRG: 291 | Disposition: A | Discharge status: Home Health | Payer: Self-pay | Attending: Internal Medicine | Admitting: Internal Medicine

## 2021-05-30 ENCOUNTER — Encounter (HOSPITAL_COMMUNITY): Payer: Self-pay | Admitting: Emergency Medicine

## 2021-05-30 ENCOUNTER — Emergency Department (HOSPITAL_COMMUNITY): Payer: Self-pay

## 2021-05-30 ENCOUNTER — Other Ambulatory Visit: Payer: Self-pay

## 2021-05-30 DIAGNOSIS — I471 Supraventricular tachycardia: Secondary | ICD-10-CM | POA: Diagnosis present

## 2021-05-30 DIAGNOSIS — Z20822 Contact with and (suspected) exposure to covid-19: Secondary | ICD-10-CM | POA: Diagnosis present

## 2021-05-30 DIAGNOSIS — I5043 Acute on chronic combined systolic (congestive) and diastolic (congestive) heart failure: Secondary | ICD-10-CM | POA: Diagnosis present

## 2021-05-30 DIAGNOSIS — I447 Left bundle-branch block, unspecified: Secondary | ICD-10-CM | POA: Diagnosis present

## 2021-05-30 DIAGNOSIS — Z6841 Body Mass Index (BMI) 40.0 and over, adult: Secondary | ICD-10-CM

## 2021-05-30 DIAGNOSIS — E66813 Obesity, class 3: Secondary | ICD-10-CM | POA: Diagnosis present

## 2021-05-30 DIAGNOSIS — J189 Pneumonia, unspecified organism: Secondary | ICD-10-CM

## 2021-05-30 DIAGNOSIS — I248 Other forms of acute ischemic heart disease: Secondary | ICD-10-CM | POA: Diagnosis present

## 2021-05-30 DIAGNOSIS — F1410 Cocaine abuse, uncomplicated: Secondary | ICD-10-CM | POA: Diagnosis present

## 2021-05-30 DIAGNOSIS — R778 Other specified abnormalities of plasma proteins: Secondary | ICD-10-CM

## 2021-05-30 DIAGNOSIS — I509 Heart failure, unspecified: Secondary | ICD-10-CM

## 2021-05-30 DIAGNOSIS — E876 Hypokalemia: Secondary | ICD-10-CM | POA: Diagnosis not present

## 2021-05-30 DIAGNOSIS — Z8249 Family history of ischemic heart disease and other diseases of the circulatory system: Secondary | ICD-10-CM

## 2021-05-30 DIAGNOSIS — F17210 Nicotine dependence, cigarettes, uncomplicated: Secondary | ICD-10-CM | POA: Diagnosis present

## 2021-05-30 DIAGNOSIS — L02212 Cutaneous abscess of back [any part, except buttock]: Secondary | ICD-10-CM | POA: Diagnosis present

## 2021-05-30 DIAGNOSIS — I480 Paroxysmal atrial fibrillation: Secondary | ICD-10-CM | POA: Diagnosis present

## 2021-05-30 DIAGNOSIS — I428 Other cardiomyopathies: Secondary | ICD-10-CM | POA: Diagnosis present

## 2021-05-30 DIAGNOSIS — Z7901 Long term (current) use of anticoagulants: Secondary | ICD-10-CM

## 2021-05-30 DIAGNOSIS — Z79899 Other long term (current) drug therapy: Secondary | ICD-10-CM

## 2021-05-30 DIAGNOSIS — I110 Hypertensive heart disease with heart failure: Secondary | ICD-10-CM

## 2021-05-30 DIAGNOSIS — N1831 Chronic kidney disease, stage 3a: Secondary | ICD-10-CM | POA: Diagnosis present

## 2021-05-30 DIAGNOSIS — N183 Chronic kidney disease, stage 3 unspecified: Secondary | ICD-10-CM | POA: Diagnosis present

## 2021-05-30 DIAGNOSIS — I4892 Unspecified atrial flutter: Secondary | ICD-10-CM | POA: Diagnosis present

## 2021-05-30 DIAGNOSIS — F1910 Other psychoactive substance abuse, uncomplicated: Secondary | ICD-10-CM | POA: Diagnosis present

## 2021-05-30 DIAGNOSIS — N1832 Chronic kidney disease, stage 3b: Secondary | ICD-10-CM | POA: Diagnosis present

## 2021-05-30 DIAGNOSIS — I130 Hypertensive heart and chronic kidney disease with heart failure and stage 1 through stage 4 chronic kidney disease, or unspecified chronic kidney disease: Principal | ICD-10-CM | POA: Diagnosis present

## 2021-05-30 DIAGNOSIS — R042 Hemoptysis: Secondary | ICD-10-CM | POA: Diagnosis present

## 2021-05-30 DIAGNOSIS — R079 Chest pain, unspecified: Secondary | ICD-10-CM

## 2021-05-30 DIAGNOSIS — Z9114 Patient's other noncompliance with medication regimen: Secondary | ICD-10-CM

## 2021-05-30 DIAGNOSIS — I10 Essential (primary) hypertension: Secondary | ICD-10-CM | POA: Diagnosis present

## 2021-05-30 DIAGNOSIS — I252 Old myocardial infarction: Secondary | ICD-10-CM

## 2021-05-30 DIAGNOSIS — Z7982 Long term (current) use of aspirin: Secondary | ICD-10-CM

## 2021-05-30 DIAGNOSIS — E785 Hyperlipidemia, unspecified: Secondary | ICD-10-CM | POA: Diagnosis present

## 2021-05-30 LAB — RESP PANEL BY RT-PCR (FLU A&B, COVID) ARPGX2
Influenza A by PCR: NEGATIVE
Influenza B by PCR: NEGATIVE
SARS Coronavirus 2 by RT PCR: NEGATIVE

## 2021-05-30 LAB — COMPREHENSIVE METABOLIC PANEL
ALT: 34 U/L (ref 0–44)
AST: 30 U/L (ref 15–41)
Albumin: 3.6 g/dL (ref 3.5–5.0)
Alkaline Phosphatase: 82 U/L (ref 38–126)
Anion gap: 12 (ref 5–15)
BUN: 26 mg/dL — ABNORMAL HIGH (ref 6–20)
CO2: 20 mmol/L — ABNORMAL LOW (ref 22–32)
Calcium: 9.1 mg/dL (ref 8.9–10.3)
Chloride: 105 mmol/L (ref 98–111)
Creatinine, Ser: 2.15 mg/dL — ABNORMAL HIGH (ref 0.61–1.24)
GFR, Estimated: 39 mL/min — ABNORMAL LOW (ref >60)
Glucose, Bld: 116 mg/dL — ABNORMAL HIGH (ref 70–99)
Potassium: 3 mmol/L — ABNORMAL LOW (ref 3.5–5.1)
Sodium: 137 mmol/L (ref 135–145)
Total Bilirubin: 1.5 mg/dL — ABNORMAL HIGH (ref 0.3–1.2)
Total Protein: 7.3 g/dL (ref 6.5–8.1)

## 2021-05-30 LAB — CBC WITH DIFFERENTIAL/PLATELET
Abs Immature Granulocytes: 0.03 10*3/uL (ref 0.00–0.07)
Basophils Absolute: 0 10*3/uL (ref 0.0–0.1)
Basophils Relative: 0 %
Eosinophils Absolute: 0 10*3/uL (ref 0.0–0.5)
Eosinophils Relative: 0 %
HCT: 39.1 % (ref 39.0–52.0)
Hemoglobin: 11.9 g/dL — ABNORMAL LOW (ref 13.0–17.0)
Immature Granulocytes: 0 %
Lymphocytes Relative: 17 %
Lymphs Abs: 1.6 10*3/uL (ref 0.7–4.0)
MCH: 21.3 pg — ABNORMAL LOW (ref 26.0–34.0)
MCHC: 30.4 g/dL (ref 30.0–36.0)
MCV: 70.1 fL — ABNORMAL LOW (ref 80.0–100.0)
Monocytes Absolute: 0.6 10*3/uL (ref 0.1–1.0)
Monocytes Relative: 7 %
Neutro Abs: 6.8 10*3/uL (ref 1.7–7.7)
Neutrophils Relative %: 76 %
Platelets: 222 10*3/uL (ref 150–400)
RBC: 5.58 MIL/uL (ref 4.22–5.81)
RDW: 18.2 % — ABNORMAL HIGH (ref 11.5–15.5)
WBC: 9.1 10*3/uL (ref 4.0–10.5)
nRBC: 0 % (ref 0.0–0.2)

## 2021-05-30 LAB — LACTIC ACID, PLASMA
Lactic Acid, Venous: 1.8 mmol/L (ref 0.5–1.9)
Lactic Acid, Venous: 1.4 mmol/L (ref 0.5–1.9)

## 2021-05-30 LAB — BRAIN NATRIURETIC PEPTIDE: B Natriuretic Peptide: >4500 pg/mL — ABNORMAL HIGH (ref 0.0–100.0)

## 2021-05-30 LAB — TROPONIN I (HIGH SENSITIVITY)
Troponin I (High Sensitivity): 780 ng/L (ref <18)
Troponin I (High Sensitivity): 799 ng/L (ref <18)

## 2021-05-30 LAB — MAGNESIUM: Magnesium: 2.1 mg/dL (ref 1.7–2.4)

## 2021-05-30 MED ORDER — AMIODARONE HCL IN DEXTROSE 360-4.14 MG/200ML-% IV SOLN: 30.0000 mg/h | INTRAVENOUS | Status: DC

## 2021-05-30 MED ORDER — DOCUSATE SODIUM 100 MG PO CAPS
100.0000 mg | ORAL_CAPSULE | Freq: Two times a day (BID) | ORAL | Status: DC
  Administered 2021-05-31 – 2021-06-02 (×3): 100 mg via ORAL
  Filled 2021-05-30 (×6): qty 1

## 2021-05-30 MED ORDER — SPIRONOLACTONE 25 MG PO TABS
25.0000 mg | ORAL_TABLET | Freq: Every day | ORAL | Status: DC
  Administered 2021-05-30 – 2021-06-03 (×5): 25 mg via ORAL
  Filled 2021-05-30 (×6): qty 1

## 2021-05-30 MED ORDER — BUDESONIDE 0.25 MG/2ML IN SUSP
0.2500 mg | Freq: Two times a day (BID) | RESPIRATORY_TRACT | Status: DC
  Administered 2021-05-30 – 2021-06-03 (×8): 0.25 mg via RESPIRATORY_TRACT
  Filled 2021-05-30 (×8): qty 2

## 2021-05-30 MED ORDER — ATORVASTATIN CALCIUM 40 MG PO TABS
40.0000 mg | ORAL_TABLET | Freq: Every day | ORAL | Status: DC
  Administered 2021-05-30 – 2021-06-03 (×5): 40 mg via ORAL
  Filled 2021-05-30 (×5): qty 1

## 2021-05-30 MED ORDER — HYDRALAZINE HCL 20 MG/ML IJ SOLN
10.0000 mg | Freq: Once | INTRAMUSCULAR | Status: AC
  Administered 2021-05-30: 10 mg via INTRAVENOUS
  Filled 2021-05-30: qty 1

## 2021-05-30 MED ORDER — FUROSEMIDE 10 MG/ML IJ SOLN
60.0000 mg | Freq: Once | INTRAMUSCULAR | Status: AC
  Administered 2021-05-30: 60 mg via INTRAVENOUS
  Filled 2021-05-30: qty 6

## 2021-05-30 MED ORDER — SODIUM CHLORIDE 0.9 % IV SOLN
500.0000 mg | Freq: Once | INTRAVENOUS | Status: DC
  Filled 2021-05-30: qty 500

## 2021-05-30 MED ORDER — ASPIRIN 81 MG PO CHEW
324.0000 mg | CHEWABLE_TABLET | Freq: Once | ORAL | Status: AC
  Administered 2021-05-30: 324 mg via ORAL
  Filled 2021-05-30: qty 4

## 2021-05-30 MED ORDER — SODIUM CHLORIDE 0.9 % IV SOLN
2.0000 g | INTRAVENOUS | Status: DC
  Administered 2021-05-31 – 2021-06-01 (×2): 2 g via INTRAVENOUS
  Filled 2021-05-30 (×2): qty 20

## 2021-05-30 MED ORDER — SODIUM CHLORIDE 0.9% FLUSH
3.0000 mL | Freq: Two times a day (BID) | INTRAVENOUS | Status: DC
  Administered 2021-05-30 – 2021-06-02 (×8): 3 mL via INTRAVENOUS

## 2021-05-30 MED ORDER — AZITHROMYCIN 500 MG IV SOLR
500.0000 mg | INTRAVENOUS | Status: DC
Start: 2021-05-30 — End: 2021-06-01
  Administered 2021-05-30 – 2021-05-31 (×2): 500 mg via INTRAVENOUS
  Filled 2021-05-30 (×2): qty 500

## 2021-05-30 MED ORDER — POLYETHYLENE GLYCOL 3350 17 G PO PACK: 17.0000 g | PACK | Freq: Every day | ORAL | Status: DC | PRN

## 2021-05-30 MED ORDER — OXYCODONE HCL 5 MG PO TABS
5.0000 mg | ORAL_TABLET | ORAL | Status: DC | PRN
  Administered 2021-05-30 – 2021-05-31 (×2): 5 mg via ORAL
  Filled 2021-05-30 (×2): qty 1

## 2021-05-30 MED ORDER — NICOTINE 14 MG/24HR TD PT24
14.0000 mg | MEDICATED_PATCH | Freq: Every day | TRANSDERMAL | Status: DC
  Administered 2021-05-31: 14 mg via TRANSDERMAL
  Filled 2021-05-30 (×3): qty 1

## 2021-05-30 MED ORDER — SODIUM CHLORIDE 0.9 % IV SOLN
INTRAVENOUS | Status: DC | PRN
  Administered 2021-05-30 – 2021-05-31 (×2): 250 mL via INTRAVENOUS

## 2021-05-30 MED ORDER — BISACODYL 5 MG PO TBEC: 5.0000 mg | DELAYED_RELEASE_TABLET | Freq: Every day | ORAL | Status: DC | PRN

## 2021-05-30 MED ORDER — AMIODARONE HCL IN DEXTROSE 360-4.14 MG/200ML-% IV SOLN: 60.0000 mg/h | INTRAVENOUS | Status: DC

## 2021-05-30 MED ORDER — HYDRALAZINE HCL 50 MG PO TABS
100.0000 mg | ORAL_TABLET | Freq: Three times a day (TID) | ORAL | Status: DC
  Administered 2021-05-30 – 2021-06-03 (×12): 100 mg via ORAL
  Filled 2021-05-30 (×12): qty 2

## 2021-05-30 MED ORDER — SODIUM CHLORIDE 0.9 % IV SOLN
100.0000 mg | Freq: Once | INTRAVENOUS | Status: AC
  Administered 2021-05-30: 100 mg via INTRAVENOUS
  Filled 2021-05-30: qty 100

## 2021-05-30 MED ORDER — ISOSORBIDE MONONITRATE ER 30 MG PO TB24
30.0000 mg | ORAL_TABLET | Freq: Every day | ORAL | Status: DC
  Administered 2021-05-30 – 2021-06-01 (×3): 30 mg via ORAL
  Filled 2021-05-30 (×3): qty 1

## 2021-05-30 MED ORDER — ONDANSETRON HCL 4 MG PO TABS: 4.0000 mg | ORAL_TABLET | Freq: Four times a day (QID) | ORAL | Status: DC | PRN

## 2021-05-30 MED ORDER — ACETAMINOPHEN 325 MG PO TABS
650.0000 mg | ORAL_TABLET | Freq: Four times a day (QID) | ORAL | Status: DC | PRN
  Administered 2021-05-30 – 2021-06-02 (×4): 650 mg via ORAL
  Filled 2021-05-30 (×4): qty 2

## 2021-05-30 MED ORDER — FUROSEMIDE 10 MG/ML IJ SOLN
40.0000 mg | Freq: Two times a day (BID) | INTRAMUSCULAR | Status: DC
  Administered 2021-05-30 – 2021-06-01 (×4): 40 mg via INTRAVENOUS
  Filled 2021-05-30 (×4): qty 4

## 2021-05-30 MED ORDER — SODIUM CHLORIDE 0.9 % IV SOLN
1.0000 g | Freq: Once | INTRAVENOUS | Status: AC
  Administered 2021-05-30: 1 g via INTRAVENOUS
  Filled 2021-05-30: qty 10

## 2021-05-30 MED ORDER — MELATONIN 5 MG PO TABS
5.0000 mg | ORAL_TABLET | Freq: Every evening | ORAL | Status: DC | PRN
  Administered 2021-05-30 – 2021-06-02 (×4): 5 mg via ORAL
  Filled 2021-05-30 (×4): qty 1

## 2021-05-30 MED ORDER — ACETAMINOPHEN 650 MG RE SUPP: 650.0000 mg | Freq: Four times a day (QID) | RECTAL | Status: DC | PRN

## 2021-05-30 MED ORDER — LIDOCAINE HCL (PF) 1 % IJ SOLN
5.0000 mL | Freq: Once | INTRAMUSCULAR | Status: AC
  Administered 2021-05-30: 5 mL via INTRADERMAL
  Filled 2021-05-30: qty 5

## 2021-05-30 MED ORDER — HYDRALAZINE HCL 20 MG/ML IJ SOLN: 5.0000 mg | INTRAMUSCULAR | Status: DC | PRN

## 2021-05-30 MED ORDER — NITROGLYCERIN 0.4 MG SL SUBL
0.4000 mg | SUBLINGUAL_TABLET | SUBLINGUAL | Status: DC | PRN
  Administered 2021-05-30 (×3): 0.4 mg via SUBLINGUAL
  Filled 2021-05-30: qty 1

## 2021-05-30 MED ORDER — ONDANSETRON HCL 4 MG/2ML IJ SOLN: 4.0000 mg | Freq: Four times a day (QID) | INTRAMUSCULAR | Status: DC | PRN

## 2021-05-30 MED ORDER — AMIODARONE LOAD VIA INFUSION
150.0000 mg | Freq: Once | INTRAVENOUS | Status: AC
  Administered 2021-05-30: 150 mg via INTRAVENOUS
  Filled 2021-05-30: qty 83.34

## 2021-05-30 MED ORDER — AMIODARONE HCL IN DEXTROSE 360-4.14 MG/200ML-% IV SOLN
60.0000 mg/h | INTRAVENOUS | Status: DC
  Administered 2021-05-30: 60 mg/h via INTRAVENOUS

## 2021-05-30 MED ORDER — POTASSIUM CHLORIDE CRYS ER 20 MEQ PO TBCR
40.0000 meq | EXTENDED_RELEASE_TABLET | Freq: Once | ORAL | Status: AC
  Administered 2021-05-30: 40 meq via ORAL
  Filled 2021-05-30: qty 2

## 2021-05-30 MED ORDER — MORPHINE SULFATE (PF) 2 MG/ML IV SOLN: 2.0000 mg | INTRAVENOUS | Status: DC | PRN

## 2021-05-30 MED ORDER — ASPIRIN EC 81 MG PO TBEC
81.0000 mg | DELAYED_RELEASE_TABLET | Freq: Every day | ORAL | Status: DC
  Administered 2021-05-30: 81 mg via ORAL
  Filled 2021-05-30: qty 1

## 2021-05-30 NOTE — Consult Note (Signed)
NAME:  Gerald [name], MRN:  [id number], DOB:  Jan 10, 1981, LOS: 0 ADMISSION DATE:  05/30/2021, CONSULTATION DATE:  05/30/21 REFERRING MD:  Lorin Mercy- TR[facility], CHIEF COMPLAINT:   Hemoptysis  History of Present Illness:  40 yo M PMH morbid obesity, systolic and diastolic HF, cocaine abuse, CKD 3b, Afib/flutter on Xarelto, HTN who presented to ED 10/13 with CC hemoptysis. Began 10/13 approx 0300. Reportedly ran out of home meds including  diuretic and xarelto 1.5 weeks prior to presentation. 20lb weight gain in past week, BLE edema. Also has an abscess on his head.  In ED, BNP >4500 trop 800, and dysrhythmia (?VT) requiring amio gtt, lasix, and started on ceftriaxone and doxy.  Cr 2.15 K 3 LA 1.8  His Bps continued to rise -- SBP 170s and he was given hydralazine.   PCCM consulted in this setting   Hemoptysis was thick and red to start now more foamy.  Last did crack cocaine last night.  Pertinent  Medical History  HTN Combined heart failure  Aflutter Polysubstance abuse  CKD 3b Morbid obesity   Significant Hospital Events: Including procedures, antibiotic start and stop dates in addition to other pertinent events   10/13 hydral, lasix, amio in ED. CCM consulted   Interim History / Subjective:  Worsening Hypertension  Incr RR   Objective   Blood pressure (!) 171/121, pulse (!) 113, temperature 98.8 F (37.1 C), temperature source Oral, resp. rate (!) 40, height 6' (1.829 m), weight 127 kg, SpO2 97 %.        Intake/Output Summary (Last 24 hours) at 05/30/2021 1118 Last data filed at 05/30/2021 1020 Gross per 24 hour  Intake 350 ml  Output 800 ml  Net -450 ml   Filed Weights   05/30/21 [id number]  Weight: 127 kg    Examination: General: middle aged man lying in bed in mild resp distress HENT: MMM, trachea midline Lungs: fairly clear, no wheezing or accessory muscle use Cardiovascular: tachycardic, regular, wide rhyhm on monitor Abdomen: Soft, +BS Extremities: trace  edema Neuro: moves all 4 ext to command Skin: no rashes  Resolved Hospital Problem list   N/a  Assessment & Plan:   Assessment & Plan:  Acute decompensated heart failure in setting of medication noncompliance and cocaine use. Baseline advanced NICM.  Scalp abscess to be drained by EDP  Widened QRS question arrhythmia- cardiology reviewing strips; on amio   Hemoptysis/alveolitis related to some combination of fluid overload and direct effects of cocaine, treatment is offloading fluid and reducing LV afterload   DM2, HTN, Afib, Substance abuse  - Stable for admission to progressive - Diuresis and antiarrythmics per cardiology team - If continues to cough up blood with better BP control can consider course of steroids - Will order some nebs to reduce cough burden - Should stop using cocaine - Would eventually get a CXR if he can get (A) clean from crack and (B) euvolemic to assure infiltrate clearance     Best Practice (right click and "Reselect all SmartList Selections" daily)  Per primary  Labs   CBC: Recent Labs  Lab 05/30/21 0648  WBC 9.1  NEUTROABS 6.8  HGB 11.9*  HCT 39.1  MCV 70.1*  PLT [id number]    Basic Metabolic Panel: Recent Labs  Lab 05/30/21 0648 05/30/21 0822  NA 137  --   K 3.0*  --   CL 105  --   CO2 20*  --   GLUCOSE 116*  --   BUN 26*  --  CREATININE 2.15*  --   CALCIUM 9.1  --   MG  --  2.1   GFR: Estimated Creatinine Clearance: 62.9 mL/min (A) (by C-G formula based on SCr of 2.15 mg/dL (H)). Recent Labs  Lab 05/30/21 0648 05/30/21 0803 05/30/21 1016  WBC 9.1  --   --   LATICACIDVEN  --  1.8 1.4    Liver Function Tests: Recent Labs  Lab 05/30/21 0648  AST 30  ALT 34  ALKPHOS 82  BILITOT 1.5*  PROT 7.3  ALBUMIN 3.6   No results for input(s): LIPASE, AMYLASE in the last 168 hours. No results for input(s): AMMONIA in the last 168 hours.  ABG No results found for: PHART, PCO2ART, PO2ART, HCO3, TCO2, ACIDBASEDEF, O2SAT    Coagulation Profile: No results for input(s): INR, PROTIME in the last 168 hours.  Cardiac Enzymes: No results for input(s): CKTOTAL, CKMB, CKMBINDEX, TROPONINI in the last 168 hours.  HbA1C: Hgb A1c MFr Bld  Date/Time Value Ref Range Status  03/30/2021 08:25 PM 6.2 (H) 4.8 - 5.6 % Final    Comment:    (NOTE) Pre diabetes:          5.7%-6.4%  Diabetes:              >6.4%  Glycemic control for   <7.0% adults with diabetes   12/22/2020 02:41 AM 6.1 (H) 4.8 - 5.6 % Final    Comment:    (NOTE) Pre diabetes:          5.7%-6.4%  Diabetes:              >6.4%  Glycemic control for   <7.0% adults with diabetes     CBG: No results for input(s): GLUCAP in the last 168 hours.  Review of Systems:    Positive Symptoms in bold:   Constitutional fevers, chills, weight loss, fatigue, anorexia, malaise  Eyes decreased vision, double vision, eye irritation  Ears, Nose, Mouth, Throat sore throat, trouble swallowing, sinus congestion  Cardiovascular chest pain, paroxysmal nocturnal dyspnea, lower ext edema, palpitations    Respiratory SOB, cough, DOE, hemoptysis, wheezing  Gastrointestinal nausea, vomiting, diarrhea  Genitourinary burning with urination, trouble urinating  Musculoskeletal joint aches, joint swelling, back pain  Integumentary  rashes, skin lesions  Neurological focal weakness, focal numbness, trouble speaking, headaches  Psychiatric depression, anxiety, confusion  Endocrine polyuria, polydipsia, cold intolerance, heat intolerance  Hematologic abnormal bruising, abnormal bleeding, unexplained nose bleeds  Allergic/Immunologic recurrent infections, hives, swollen lymph nodes     Past Medical History:  He,  has a past medical history of Arrhythmia, CHF (congestive heart failure) ([place]), Dysrhythmia, Hypertension, and Polysubstance abuse ([place]).   Surgical History:   Past Surgical History:  Procedure Laterality Date   KNEE SURGERY Right    LEFT HEART CATH AND  CORONARY ANGIOGRAPHY N/A 12/21/2020   Procedure: LEFT HEART CATH AND CORONARY ANGIOGRAPHY;  Surgeon: Martinique, Peter M, MD;  Location: [place] [facility];  Service: Cardiovascular;  Laterality: N/A;   TEE WITHOUT CARDIOVERSION N/A 04/04/2019   Procedure: TRANSESOPHAGEAL ECHOCARDIOGRAM (TEE);  Surgeon: [name], MD;  Location: [facility] OR[facility];  Service: Cardiovascular;  Laterality: N/A;     Social History:   reports that he has been smoking cigarettes. He has a 21.00 pack-year smoking history. He has never used smokeless tobacco. He reports current alcohol use. He reports current drug use. Drugs: Cocaine and Marijuana.   Family History:  His family history includes Heart disease in his paternal grandfather; Hypertension in  his father.   Allergies No Known Allergies   Home Medications  Prior to Admission medications   Medication Sig Start Date End Date Taking? Authorizing Provider  aspirin 81 MG EC tablet Take 1 tablet (81 mg total) by mouth daily. Swallow whole. 04/02/21 07/01/21 Yes Arrien, Jimmy Picket, MD  Caffeine-Magnesium Salicylate (DIUREX PO) Take 1 tablet by mouth daily as needed (bloating).   Yes [provider]  carvedilol (COREG) 25 MG tablet Take 1 tablet (25 mg total) by mouth 2 (two) times daily with a meal. 04/02/21 05/30/21 Yes Arrien, Jimmy Picket, MD  dapagliflozin propanediol (FARXIGA) 10 MG TABS tablet Take 10 mg by mouth daily.   Yes [provider]  furosemide (LASIX) 40 MG tablet Take 1.5 tablets (60 mg total) by mouth daily. Patient taking differently: Take 40 mg by mouth 2 (two) times daily. 04/02/21 05/30/21 Yes Arrien, Jimmy Picket, MD  hydrALAZINE (APRESOLINE) 100 MG tablet Take 1 tablet (100 mg total) by mouth every 8 (eight) hours. 04/02/21 05/30/21 Yes Arrien, Jimmy Picket, MD  isosorbide mononitrate (IMDUR) 30 MG 24 hr tablet Take 1 tablet (30 mg total) by mouth daily. 04/02/21 05/30/21 Yes Arrien, Jimmy Picket, MD  potassium chloride  (KLOR-CON) 10 MEQ tablet Take 1 tablet (10 mEq total) by mouth daily. Patient taking differently: Take 10 mEq by mouth 2 (two) times daily. 04/02/21 05/30/21 Yes Arrien, Jimmy Picket, MD  rivaroxaban (XARELTO) 20 MG TABS tablet Take 1 tablet (20 mg total) by mouth daily with supper. 04/02/21 05/30/21 Yes Arrien, Jimmy Picket, MD  sacubitril-valsartan (ENTRESTO) 24-26 MG Take 1 tablet by mouth 2 (two) times daily.   Yes [provider]  spironolactone (ALDACTONE) 25 MG tablet Take 1 tablet (25 mg total) by mouth daily for 30 doses. 04/02/21 05/30/21 Yes Arrien, Jimmy Picket, MD  atorvastatin (LIPITOR) 40 MG tablet Take 1 tablet (40 mg total) by mouth daily. Patient not taking: Reported on 05/30/2021 04/02/21 05/02/21  Arrien, Jimmy Picket, MD

## 2021-05-30 NOTE — ED Notes (Signed)
Dr.Schlossman states to hold amiodarone drip until she clears with cardiologist. Will continue to monitor.

## 2021-05-30 NOTE — Consult Note (Addendum)
Cardiology Consultation:   Patient ID: Gerald [name] MRN: [id number]; DOB: 07/19/1981  Admit date: 05/30/2021 Date of Consult: 05/30/2021  PCP:  Patient, [name] (Inactive)   [facility] Providers Cardiologist:  Ida Rogue, MD      Pt now lives in [place]   Patient Profile:   Gerald [name] is a 40 y.o. male with a hx of S-D-CHF (no CAD at cath 12/2020), A flutter s/p TEE/DCCV on Xarelto, HTN, polysubs abuse, tob use, obesity, who is being seen 05/30/2021 for the evaluation of CHF, Aflutter at the request of Dr Lorin Mercy.  History of Present Illness:   [name] was hospitalized 08/13-08/16/2022 for CHF exacerbation, had been off all meds >> resumed. Wt at d/c 125.5 kg, in SR, QRS 185 ms, f/u with cards as outpt.  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  He has been off all medications for about a week and a half.  He did have some HCTZ left and took 1 of those this morning.  After he took the HCTZ, he started vomiting.  He vomited multiple times.  He was also coughing and states that he coughed up some bright red blood.  He felt like it was a large amount.  That is why he came to the hospital.  The shortness of breath started about 3 days ago, after he had been off his meds for about a week.  It gradually worsened to the point that he became short of breath at rest, as he is now.  He has the capacity to weigh himself daily, but does not.  He last did cocaine about 2 days ago.  He hurts all over, feels terrible in general, and says he will never do cocaine again.  He developed chest pain today, it is at about the level of the third rib on the left side of the sternum.  It is reproducible with palpation.  It was severe at first, not that bad right now.  However, that is in the context of him hurting all over.  He admits that  these things he is doing are bad for him and says he will never do them again.  Past Medical History:  Diagnosis Date   Arrhythmia    CHF (congestive heart failure) ([name])    Dysrhythmia    brief episode afib, cardioverted did not return   Hypertension    Polysubstance abuse [facility])     Past Surgical History:  Procedure Laterality Date   KNEE SURGERY Right    LEFT HEART CATH AND CORONARY ANGIOGRAPHY N/A 12/21/2020   Procedure: LEFT HEART CATH AND CORONARY ANGIOGRAPHY;  Surgeon: Martinique, Peter M, MD;  Location: [place] [facility];  Service: Cardiovascular;  Laterality: N/A;   TEE WITHOUT CARDIOVERSION N/A 04/04/2019   Procedure: TRANSESOPHAGEAL ECHOCARDIOGRAM (TEE);  Surgeon: [name], MD;  Location: [facility];  Service: Cardiovascular;  Laterality: N/A;     Home Medications:  Prior to Admission medications   Medication Sig Start Date End Date Taking? Authorizing Provider  aspirin 81 MG EC tablet Take 1 tablet (81 mg total) by mouth daily. Swallow whole. 04/02/21 07/01/21 Yes Arrien, Jimmy Picket, MD  Caffeine-Magnesium Salicylate (DIUREX PO) Take 1 tablet by mouth daily as needed (bloating).   Yes [provider]  carvedilol (COREG)  25 MG tablet Take 1 tablet (25 mg total) by mouth 2 (two) times daily with a meal. 04/02/21 05/30/21 Yes Arrien, Jimmy Picket, MD  dapagliflozin propanediol (FARXIGA) 10 MG TABS tablet Take 10 mg by mouth daily.   Yes [provider]  furosemide (LASIX) 40 MG tablet Take 1.5 tablets (60 mg total) by mouth daily. Patient taking differently: Take 40 mg by mouth 2 (two) times daily. 04/02/21 05/30/21 Yes Arrien, Jimmy Picket, MD  hydrALAZINE (APRESOLINE) 100 MG tablet Take 1 tablet (100 mg total) by mouth every 8 (eight) hours. 04/02/21 05/30/21 Yes Arrien, Jimmy Picket, MD  isosorbide mononitrate (IMDUR) 30 MG 24 hr tablet Take 1 tablet (30 mg total) by mouth daily. 04/02/21 05/30/21 Yes Arrien, Jimmy Picket, MD  potassium  chloride (KLOR-CON) 10 MEQ tablet Take 1 tablet (10 mEq total) by mouth daily. Patient taking differently: Take 10 mEq by mouth 2 (two) times daily. 04/02/21 05/30/21 Yes Arrien, Jimmy Picket, MD  rivaroxaban (XARELTO) 20 MG TABS tablet Take 1 tablet (20 mg total) by mouth daily with supper. 04/02/21 05/30/21 Yes Arrien, Jimmy Picket, MD  sacubitril-valsartan (ENTRESTO) 24-26 MG Take 1 tablet by mouth 2 (two) times daily.   Yes [provider]  spironolactone (ALDACTONE) 25 MG tablet Take 1 tablet (25 mg total) by mouth daily for 30 doses. 04/02/21 05/30/21 Yes Arrien, Jimmy Picket, MD  atorvastatin (LIPITOR) 40 MG tablet Take 1 tablet (40 mg total) by mouth daily. Patient not taking: Reported on 05/30/2021 04/02/21 05/02/21  Arrien, Jimmy Picket, MD    Inpatient Medications: Scheduled Meds:  aspirin EC  81 mg Oral Daily   atorvastatin  40 mg Oral Daily   docusate sodium  100 mg Oral BID   furosemide  40 mg Intravenous BID   hydrALAZINE  100 mg Oral Q8H   isosorbide mononitrate  30 mg Oral Daily   nicotine  14 mg Transdermal Daily   sodium chloride flush  3 mL Intravenous Q12H   spironolactone  25 mg Oral Daily   Continuous Infusions:  azithromycin     [START ON 05/31/2021] cefTRIAXone (ROCEPHIN)  IV     PRN Meds: acetaminophen **OR** acetaminophen, bisacodyl, hydrALAZINE, morphine injection, ondansetron **OR** ondansetron (ZOFRAN) IV, oxyCODONE, polyethylene glycol  Allergies:   No Known Allergies  Social History:   Social History   Socioeconomic History   Marital status: Single    Spouse name: Not on file   Number of children: Not on file   Years of education: Not on file   Highest education level: Not on file  Occupational History   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    Drug use: Yes    Types: Cocaine, Marijuana    Comment: last THC use 4 days ago, cocaine 3 days ago   Sexual activity: Not on file  Other Topics Concern   Not on file  Social History Narrative   Not on file   Social Determinants of Health   Financial Resource Strain: Medium Risk   Difficulty of Paying Living Expenses: Somewhat hard  Food Insecurity: No Food Insecurity   Worried  About Running Out of Food in the Last Year: Never true   Ran Out of Food in the Last Year: Never true  Transportation Needs: No Transportation Needs   Lack of Transportation (Medical): No   Lack of Transportation (Non-Medical): No  Physical Activity: Not on file  Stress: Not on file  Social Connections: Not on file  Intimate Partner Violence: Not on file    Family History:   Family History  Problem Relation Age of Onset   Hypertension Father    Heart disease Paternal Grandfather      ROS:  Please see the history of present illness.  All other ROS reviewed and negative.     Physical Exam/Data:   Vitals:   05/30/21 1015 05/30/21 1030 05/30/21 1045 05/30/21 1100  BP: (!) 165/106 (!) 196/116 (!) 171/121 123/82  Pulse: (!) 114 (!) 112 (!) 113 (!) 114  Resp: (!) 28 (!) 22 (!) 40 (!) 32  Temp:      TempSrc:      SpO2: 97% 98% 97% 97%  Weight:      Height:        Intake/Output Summary (Last 24 hours) at 05/30/2021 1135 Last data filed at 05/30/2021 1132 Gross per 24 hour  Intake 449.99 ml  Output 800 ml  Net -350.01 ml   Last 3 Weights 05/30/2021 04/02/2021 04/01/2021  Weight (lbs) 280 lb 276 lb 10.8 oz 278 lb 3.2 oz  Weight (kg) 127.007 kg 125.5 kg 126.191 kg     Body mass index is 37.97 kg/m.  General:  Well nourished, well developed, in acute distress HEENT: normal Neck: JVD elevated, patient is restless and cannot sit still. Vascular: No carotid bruits; Distal pulses 2+  bilaterally Cardiac:  normal S1, S2; RRR; no murmur  Lungs: Decreased breath sounds bases bilaterally, no wheezing, rhonchi R>L rales  Abd: soft, nontender, no hepatomegaly  Ext: no edema Musculoskeletal:  No deformities, BUE and BLE strength normal and equal Skin: warm and dry  Neuro:  CNs 2-12 intact, no focal abnormalities noted Psych: Anxious, uncomfortable  EKG:  The EKG was personally reviewed and demonstrates: Believed to be sinus tach, although P waves are upright in aVL.  QRS duration 179 ms, previously 185 ms, QRS duration normal 12/2020, then was 178 ms 01/2021 Telemetry:  Telemetry was personally reviewed and demonstrates: Sinus tachycardia with frequent PACs  Relevant CV Studies:  ECHO:  01/20/2021  1. Left ventricular ejection fraction, by estimation, is 30 to 35%. The  left ventricle has moderately decreased function. The left ventricle  demonstrates global hypokinesis. The left ventricular internal cavity size  was mildly dilated. There is severe left ventricular hypertrophy. Left ventricular diastolic parameters are indeterminate.   2. Right ventricular systolic function is normal. The right ventricular  size is normal. Moderately increased right ventricular wall thickness.  Tricuspid regurgitation signal is inadequate for assessing PA pressure.   3. Left atrial size was severely dilated.   4. Right atrial size was mildly dilated.   5. The mitral valve is normal in structure. Trivial mitral valve  regurgitation.   6. The aortic valve is tricuspid. Aortic valve regurgitation is trivial.  No aortic stenosis is present.   7. The inferior vena cava is dilated in size with >50% respiratory  variability, suggesting right atrial pressure of 8 mmHg.   CARDIAC CATH: [id number] LV end diastolic pressure is moderately elevated.   1. Very large and normal coronary arteries 2. Moderately elevated LVEDP 24 mm Hg  Plan: focus on BP control and optimization of CHF  therapy.  Laboratory Data:  High Sensitivity Troponin:   Recent Labs  Lab 05/30/21 0648 05/30/21 0822  TROPONINIHS 780* 799*     Chemistry Recent Labs  Lab 05/30/21 0648 05/30/21 0822  NA 137  --   K 3.0*  --   CL 105  --   CO2 20*  --   GLUCOSE 116*  --   BUN 26*  --   CREATININE 2.15*  --   CALCIUM 9.1  --   MG  --  2.1  GFRNONAA 39*  --   ANIONGAP 12  --     Recent Labs  Lab 05/30/21 0648  PROT 7.3  ALBUMIN 3.6  AST 30  ALT 34  ALKPHOS 82  BILITOT 1.5*   Lipids No results for input(s): CHOL, TRIG, HDL, LABVLDL, LDLCALC, CHOLHDL in the last 168 hours.  Hematology Recent Labs  Lab 05/30/21 0648  WBC 9.1  RBC 5.58  HGB 11.9*  HCT 39.1  MCV 70.1*  MCH 21.3*  MCHC 30.4  RDW 18.2*  PLT 222   Thyroid No results for input(s): TSH, FREET4 in the last 168 hours.  BNP Recent Labs  Lab 05/30/21 0648  BNP >4,500.0*    DDimer No results for input(s): DDIMER in the last 168 hours. Lab Results  Component Value Date   HGBA1C 6.2 (H) 03/30/2021     Radiology/Studies:  DG Chest 1 View  Result Date: 05/30/2021 CLINICAL DATA:  Chest pain with shortness of breath and hematemesis EXAM: CHEST  1 VIEW COMPARISON:  04/02/2021 FINDINGS: Extensive airspace disease on the right. Chronic cardiomegaly and vascular pedicle widening. No Kerley lines, effusion, or pneumothorax. IMPRESSION: Extensive airspace disease on the right. Chronic cardiomegaly. Electronically Signed   By: Jorje Guild M.D.   On: 05/30/2021 07:13     Assessment and Plan:   Acute on chronic combined systolic and diastolic CHF -Related to medication noncompliance -  Creatinine today is 2.15, and with previous values - He has been given Lasix 60 mg IV, and is having good urine output from that - Continue Lasix 60 mg or 80 mg IV twice daily, follow renal function and daily weights, intake/output - Restart other medications including hydralazine and Imdur - Was also supposed to be on carvedilol  25 mg twice daily, hold off on this with acute CHF exacerbation, possibly can start a lower dose - With poor renal function, hold off on restarting Aldactone and Entresto until we see what his creatinine does with diuresis  2.  Hypertension - He is severely hypertensive and has been given sublingual nitroglycerin x3, then hydralazine 10 mg IV - The nitro did not seem to help his blood pressure, but other staff did - IM has restarted his hydralazine and Imdur - Continue to follow  Otherwise, per IM   Risk Assessment/Risk Scores:     HEAR Score (for undifferentiated chest pain):  HEAR Score: 3  New York Heart Association (NYHA) Functional Class NYHA Class IV   For questions or updates, please contact [place] Please consult [email or web address] for contact info under    Signed, Rosaria Ferries, PA-C  05/30/2021 11:35 AM  I have seen and examined the patient along with Rosaria Ferries, PA-C , PA NP.  I have reviewed the chart, notes and new data.  I agree with PA/NP's note.  Key new complaints: Severe shortness of breath/orthopnea and cough with hemoptoic sputum at rest, substantial weight gain with  some leg edema. Key examination changes: Tachypneic, clear respiratory difficulty.  No wheezing, some rales in bases, tachycardic with ectopy on a regular baseline, no audible murmurs, summation gallop present, mild ankle swelling.  Hard to evaluate jugular veins due to body habitus. Key new findings / data: ECG shows wide-complex tachycardia that is regular at baseline with occasional irregularities.  The QRS is very broad but this is a chronic pattern.  I puzzled over the tracings for a while and wonders whether this may be atrial flutter with variable AV block, but most likely he has sinus tachycardia with PACs.  We will stop the amiodarone.  Reviewed the echocardiogram images and coronary angiogram images from earlier this year  PLAN: Acute decompensation of chronic combined systolic and  diastolic heart failure due to nonischemic cardiomyopathy secondary to untreated hypertension and cocaine abuse.  Very recent cocaine use contributed to this exacerbation. His medical condition is further complicated by significant chronic kidney insufficiency, stage IIIb. Troponin elevation is secondary to subendocardial ischemia in the setting of markedly decompensated heart failure, not due to a true acute coronary syndrome. Resume heart failure meds/antihypertensives, while holding beta-blockers until he is closer to euvolemic status. His prognosis is very poor unless he becomes compliant with medications, sodium restriction, healthy lifestyle and complete abstinence from cocaine and other stimulants. He is at extremely high risk for sudden arrhythmic death due to VT/VF when using cocaine.  Although it is conceivable that he would reap benefit from cardiac resynchronization therapy he is not a candidate for CRT-D while still abusing cocaine and proving such noncompliance with medical recommendations.  Sanda Klein, MD, [place] [phone] 05/30/2021, 12:47 PM

## 2021-05-30 NOTE — H&P (Signed)
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 male with medical history significant of chronic combined CHF; stage 3b CKD; afib s/p DCCV on Xarelto; HTN; polysubstance abuse; and morbid obesity presenting with hemoptysis. He has been coughing up blood this AM since about 0300.  He has been feeling SOB for a few days.  He ran out of his medications for about 1 1/2 weeks, had HCTZ left and has been taking that.  He was coughing some phlegm prior to hemoptysis, clear.  He has gained about 20 pounds in about a week.  +LE edema.  No fever, 98.1 night before last and then "it went up again" to 98.7.  No sick contacts.  He does have an abscess on the back of his head, thought it was a spider bite.    ED Course: Ran out of meds a week ago - diuretics, AC, only HCTZ left. 2 days of CP/SOB, cough, hemoptysis.  Likely CHF and/or PNA.  CXR with extensive disease on the right. Labs with elevated troponin, BNP markedly elevated, renal function similar to prior.  Wide complex, concerning for VT so started on Amiodarone, appears unchanged from prior.  Cardiology thinks maybe atrial flutter, recommends continuing Amio and cards will see.  Given Rocephin, Doxy.  Given Lasix 60 mg, hydralazine.  Also with abscess on the back of his head, will drain that.  Review of Systems: As per HPI; otherwise review of systems reviewed and negative.   Ambulatory Status:  Ambulates without assistance  COVID Vaccine Status:  Complete  Past Medical History:  Diagnosis Date   Arrhythmia    CHF (congestive heart failure) ([name])    Dysrhythmia    brief episode afib, cardioverted did not return   Hypertension    Polysubstance abuse [facility])     Past  Surgical History:  Procedure Laterality Date   KNEE SURGERY Right    LEFT HEART CATH AND CORONARY ANGIOGRAPHY N/A 12/21/2020   Procedure: LEFT HEART CATH AND CORONARY ANGIOGRAPHY;  Surgeon: Martinique, Peter M, MD;  Location: [facility];  Service: Cardiovascular;  Laterality: N/A;   TEE WITHOUT CARDIOVERSION N/A 04/04/2019   Procedure: TRANSESOPHAGEAL ECHOCARDIOGRAM (TEE);  Surgeon: [name], MD;  Location: [facility];  Service: Cardiovascular;  Laterality: N/A;    Social History   Socioeconomic History   Marital status: Single    Spouse name: Not on file   Number of children: Not on file   Years of education: Not on file   Highest education level: Not on file  Occupational History   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    Drug use: Yes    Types: Cocaine, Marijuana    Comment: last THC use 4 days ago, cocaine 3 days ago   Sexual activity: Not on file  Other Topics Concern   Not  on file  Social History Narrative   Not on file   Social Determinants of Health   Financial Resource Strain: Medium Risk   Difficulty of Paying Living Expenses: Somewhat hard  Food Insecurity: No Food Insecurity   Worried About Charity fundraiser in the Last Year: Never true   Ran Out of Food in the Last Year: Never true  Transportation Needs: No Transportation Needs   Lack of Transportation (Medical): No   Lack of Transportation (Non-Medical): No  Physical Activity: Not on file  Stress: Not on file  Social Connections: Not on file  Intimate Partner Violence: Not on file    No Known Allergies  Family History  Problem Relation Age of Onset   Hypertension Father    Heart disease Paternal  Grandfather     Prior to Admission medications   Medication Sig Start Date End Date Taking? Authorizing Provider  aspirin 81 MG EC tablet Take 1 tablet (81 mg total) by mouth daily. Swallow whole. 04/02/21 07/01/21  Arrien, Jimmy Picket, MD  atorvastatin (LIPITOR) 40 MG tablet Take 1 tablet (40 mg total) by mouth daily. 04/02/21 05/02/21  Arrien, Jimmy Picket, MD  carvedilol (COREG) 25 MG tablet Take 1 tablet (25 mg total) by mouth 2 (two) times daily with a meal. 04/02/21 05/02/21  Arrien, Jimmy Picket, MD  furosemide (LASIX) 40 MG tablet Take 1.5 tablets (60 mg total) by mouth daily. 04/02/21 05/02/21  Arrien, Jimmy Picket, MD  hydrALAZINE (APRESOLINE) 100 MG tablet Take 1 tablet (100 mg total) by mouth every 8 (eight) hours. 04/02/21 05/02/21  Arrien, Jimmy Picket, MD  isosorbide mononitrate (IMDUR) 30 MG 24 hr tablet Take 1 tablet (30 mg total) by mouth daily. 04/02/21 05/02/21  Arrien, Jimmy Picket, MD  potassium chloride (KLOR-CON) 10 MEQ tablet Take 1 tablet (10 mEq total) by mouth daily. 04/02/21 05/02/21  Arrien, Jimmy Picket, MD  rivaroxaban (XARELTO) 20 MG TABS tablet Take 1 tablet (20 mg total) by mouth daily with supper. 04/02/21 05/02/21  Arrien, Jimmy Picket, MD  spironolactone (ALDACTONE) 25 MG tablet Take 1 tablet (25 mg total) by mouth daily for 30 doses. 04/02/21 05/02/21  Tawni Millers, MD    Physical Exam: Vitals:   05/30/21 1100 05/30/21 1154 05/30/21 1219 05/30/21 1221  BP: 123/82 (!) 158/98 (!) 159/121   Pulse: (!) 114 (!) 111 (!) 114   Resp: (!) 32 17 20   Temp:  98.6 F (37 C) 99.7 F (37.6 C)   TempSrc:  Oral Oral   SpO2: 97% 97% 99%   Weight:    132.9 kg  Height:    '5\' 11"'$  (1.803 m)     General:  Appears anxious but is in NAD, reports sincere desire to quit drugs and be more compliant Eyes:  PERRL, EOMI, normal lids, iris ENT:  grossly normal hearing, lips & tongue, mmm Neck:  no LAD, masses or thyromegaly Cardiovascular:  RR with  tachycardia, no m/r/g. 1+ LE edema.  Respiratory:   Diffuse rhonchi.  Mildly increased respiratory effort. Abdomen:  soft, NT, ND Back:   normal alignment, no CVAT Skin:  no rash or induration seen on limited exam Musculoskeletal:  grossly normal tone BUE/BLE, good ROM, no bony abnormality Psychiatric:  Mildly anxious mood and affect, speech fluent and appropriate, AOx3 Neurologic:  CN 2-12 grossly intact, moves all extremities in coordinated fashion    Radiological Exams on Admission: Independently reviewed - see discussion in A/P where applicable  DG Chest 1 View  Result Date: 05/30/2021 CLINICAL DATA:  Chest pain with shortness of breath and hematemesis EXAM: CHEST  1 VIEW COMPARISON:  04/02/2021 FINDINGS: Extensive airspace disease on the right. Chronic cardiomegaly and vascular pedicle widening. No Kerley lines, effusion, or pneumothorax. IMPRESSION: Extensive airspace disease on the right. Chronic cardiomegaly. Electronically Signed   By: Jorje Guild M.D.   On: 05/30/2021 07:13    EKG: Independently reviewed.  Sinus tachycardia vs. Atrial flutter with rate 115; LBBB, NSCSLT   Labs on Admission: I have personally reviewed the available labs and imaging studies at the time of the admission.  Pertinent labs:   K+ 3.0 BUN 26/Creatinine 2.15/GFR 39 - progressively worsening over time BNP >4500 HS troponin 780, 799 Lactate 1.8 WBC 9.1 Hgb 11.9 COVID/flu negative  Assessment/Plan Principal Problem:   Acute on chronic combined systolic and diastolic CHF (congestive heart failure) ([name]) Active Problems:   Essential hypertension   PAF (paroxysmal atrial fibrillation) ([name])   CKD (chronic kidney disease), stage III ([name])   Polysubstance abuse ([name])   Obesity, Class III, BMI 40-49.9 (morbid obesity) ([name])   Acute on chronic combined CHF -Patient presenting with hemoptysis and SOB -He recently ran out of medications and has only been taking HCTZ -He did have evidence of  end organ failure, with elevated troponin, BNP -The patient received Hydralazine as well as Lasix in the ER with subsequent decrease in BP  -CXR consistent with significant R airspace disease -Markedly elevated BNP  -Will admit to progressive care for ongoing monitoring and treament -Elevated troponin; will request cardiology consultation -Will hold Farxigo, Coreg, Delene Loll - has not been taking and acutely decompensated, but will need to resume when appropriate -Will resume home hydralazine and add prn IV hydralazine -CHF order set utilized via the [facility] -Was given Lasix 60 mg x 1 in ER and will repeat with 40 mg IV BID -Continue  O2 prn for now -Resume spironolactone, Imdur  Hemoptysis -Likely due to CHF in the setting of cocaine use -However, he does have extensive R lung disease with possible underlying issue -Abx coverage -Pulm consult     Wide complex ?arrythmia -H/o afib but EKG more concerning today for complex arrythmia -Patient was seen by cardiology  -On Amiodarone -Hold Xarelto for now - if patient is not going to be compliant there is no reason to start  HTN -As noted above, continue hydralazine, aldactone -Will also add prn hydralazine   HLD -Resume Lipitor   Stage 3b CKD -Appears to be progressively worsening -Recheck BMP in AM -If he continues to have renal decline, he will soon need nephrology outpatient to prepare for eventual HD   Polysubstance abuse -Cessation encouraged; this should be encouraged on an ongoing basis -He acknowledges ongoing marijuana and cocaine use and acknowledges that he will die if he continues to use -UDS ordered   Tobacco dependence -Encourage cessation.    -Patch ordered at patient request.   Obesity -BMI 40.88 -Weight loss should be encouraged -Outpatient PCP/bariatric medicine/bariatric surgery f/u encouraged           Note: This patient has been tested and is negative for the novel coronavirus COVID-19 (repeat  testing is also pending, for uncertain reasons).       DVT prophylaxis: SCDs due to hemoptysis Code Status:  Full - confirmed with patient Family Communication: None present; patient did not want me to contact family Disposition Plan:  The patient is from: home  Anticipated d/c is to: home             Anticipated d/c date will depend on clinical response to treatment, likely 2-4 days             Patient is currently: acutely ill Consults called: Cardiology; Pulmonology; [facility] team; Heart failure navigator; RT Admission status: Admit - It is my clinical opinion that admission to INPATIENT is reasonable and necessary because this patient will require at least 2 midnights in the hospital to treat this condition based on the medical complexity of the problems presented.  Given the aforementioned information, the predictability of an adverse outcome is felt to be significant.   Karmen Bongo MD [facility]   How to contact the [facility] Attending or Consulting provider [place] or covering provider during after hours [place], for this patient?  Check the care team in [facility] and look for a) attending/consulting [facility] provider listed and b) the [facility] team listed Log into [email or web address] and use [place] universal password to access. If you do not have the password, please contact the hospital operator. Locate the [facility] provider you are looking for under [facility] and page to a number that you can be directly reached. If you still have difficulty reaching the provider, please page the [facility] (Director on Call) for the Hospitalists listed on amion for assistance.   05/30/2021, 1:01 PM

## 2021-05-30 NOTE — ED Provider Notes (Signed)
Emergency Medicine Provider Triage Evaluation Note  Gerald [name] , a 40 y.o. male  was evaluated in triage.  Pt complains of coughing up blood for the past 24 hours.  Has had nasal congestion, cough, and mild fever.  States he feels like he has pneumonia again.  Admitted for same in August along with CHF. He is on xarelto.  Began vomiting on arrival to triage.  No recent covid exposures.    Review of Systems  Positive: hemoptysis Negative: fever  Physical Exam  BP (!) 191/142 (BP Location: Left Arm)   Pulse (!) 117   Temp 98.8 F (37.1 C) (Oral)   Resp (!) 26   Ht 6' (1.829 m)   Wt 127 kg   SpO2 93%   BMI 37.97 kg/m   Gen:   Awake, no distress   Resp:  Normal effort  MSK:   Moves extremities without difficulty  Other:  Vomiting in triage  Medical Decision Making  Medically screening exam initiated at 6:30 AM.  Appropriate orders placed.  Gerald [name] was informed that the remainder of the evaluation will be completed by another provider, this initial triage assessment does not replace that evaluation, and the importance of remaining in the ED until their evaluation is complete.  EKG, labs, CXR ordered.   Larene Pickett, PA-C 05/30/21 [place], [place], DO 05/30/21 2340

## 2021-05-30 NOTE — ED Notes (Signed)
Dr.Schlossman notified of pt troponin of 780.

## 2021-05-30 NOTE — ED Notes (Signed)
Repeat EKG given to MD. Amio started. Pt reports worsening left sided chest pain with nausea. Pt remains alert and oriented x 4. Reports he was recently discharged for chest pain. Extensive cardiac hx per pt. Pt placed on zoll pads with monitor and crash cart at bedside. Will continue to monitor.

## 2021-05-30 NOTE — ED Notes (Signed)
O2 at 2LPM initiated for comfort. 2nd Nitro SL administered. Will continue to monitor.

## 2021-05-30 NOTE — ED Provider Notes (Signed)
[facility] EMERGENCY DEPARTMENT Provider Note   CSN: [id number] Arrival date & time: 05/30/21  0630     History Chief Complaint  Patient presents with   Chest Pain   Hematemesis    Gerald [name] is a 40 y.o. male.  HPI  HPI: A 40 year old patient with a history of hypertension and obesity presents for evaluation of chest pain. Initial onset of pain was less than one hour ago. The patient's chest pain is well-localized, is sharp and is not worse with exertion. The patient's chest pain is middle- or left-sided, is not described as heaviness/pressure/tightness and does not radiate to the arms/jaw/neck. The patient does not complain of nausea and denies diaphoresis. The patient has smoked in the past 90 days. The patient has no history of stroke, has no history of peripheral artery disease, denies any history of treated diabetes, has no relevant family history of coronary artery disease (first degree relative at less than age 61) and has no history of hypercholesterolemia.   40 year old male with a history of CHF, hypertension, atrial fibrillation on Xarelto, polysubstance abuse, prior admissions for pneumonia and CHF, presents with similar symptoms of chest pain, shortness of breath, cough.  Reports that he ran out of his medications about 1 week ago.  Over the last 2 days, he has developed worsening symptoms of shortness of breath, constant chest pain, and cough.  Cough had mucus, but also reports coughing up blood when asked if he had a fever he reported he did but temperature was not higher than 99.  Shortness of breath worse laying down and with minimal exertion.  Chest pain is described as a tightness in the center of the chest.  Also feels lightheaded.  Severe fatigue.  He last used cocaine 2 days ago.  Reports the symptoms are similar to what he had prior to his last admission.  He has been taking his hydrochlorothiazide, but ran out of his other medications including  his blood thinners and fluid pills.  Past Medical History:  Diagnosis Date   Arrhythmia    CHF (congestive heart failure) ([place])    Dysrhythmia    brief episode afib, cardioverted did not return   Hypertension    Polysubstance abuse [facility])     Patient Active Problem List   Diagnosis Date Noted   Acute diastolic CHF (congestive heart failure) ([place]) 03/30/2021   CHF (congestive heart failure) ([place]) 03/30/2021   Hypertensive crisis 01/20/2021   Polysubstance abuse ([place]) 01/20/2021   Obesity, Class III, BMI 40-49.9 (morbid obesity) ([place]) 01/20/2021   Hypertensive emergency 12/21/2020   CKD (chronic kidney disease), stage III ([place]) 12/21/2020   Acute on chronic systolic CHF (congestive heart failure) ([place]) 07/05/2020   PAF (paroxysmal atrial fibrillation) ([place]) 07/05/2020   Elevated troponin 07/05/2020   Microcytic anemia 07/05/2020   Hypokalemia 07/05/2020   Malignant hypertension    Acute on chronic combined systolic and diastolic CHF (congestive heart failure) ([name])    Atrial flutter ([place])    Essential hypertension    Acute systolic congestive heart failure ([name])    Acute respiratory failure ([place]) 04/01/2019   NSTEMI (non-ST elevated myocardial infarction) ([place]) 12/21/2015   Atypical pneumonia 12/21/2015    Past Surgical History:  Procedure Laterality Date   KNEE SURGERY Right    LEFT HEART CATH AND CORONARY ANGIOGRAPHY N/A 12/21/2020   Procedure: LEFT HEART CATH AND CORONARY ANGIOGRAPHY;  Surgeon: Martinique, Peter M, MD;  Location: [facility] LAB;  Service: Cardiovascular;  Laterality: N/A;   TEE WITHOUT CARDIOVERSION N/A 04/04/2019   Procedure: TRANSESOPHAGEAL ECHOCARDIOGRAM (TEE);  Surgeon: [name], MD;  Location: [facility] ORS;  Service: Cardiovascular;  Laterality: N/A;       Family History  Problem Relation Age of Onset   Hypertension Father    Heart disease Paternal Grandfather     Social History   Tobacco Use   Smoking status: Every Day    Packs/day:  0.75    Years: 28.00    Pack years: 21.00    Types: Cigarettes   Smokeless tobacco: Never   Tobacco comments:    declines patch  Vaping Use   Vaping Use: Never used  Substance Use Topics   Alcohol use: Yes    Comment: occas.    Drug use: Yes    Types: Cocaine, Marijuana    Comment: last THC use 4 days ago, cocaine 3 days ago    Home Medications Prior to Admission medications   Medication Sig Start Date End Date Taking? Authorizing Provider  aspirin 81 MG EC tablet Take 1 tablet (81 mg total) by mouth daily. Swallow whole. 04/02/21 07/01/21 Yes Arrien, Jimmy Picket, MD  Caffeine-Magnesium Salicylate (DIUREX PO) Take 1 tablet by mouth daily as needed (bloating).   Yes [provider]  carvedilol (COREG) 25 MG tablet Take 1 tablet (25 mg total) by mouth 2 (two) times daily with a meal. 04/02/21 05/30/21 Yes Arrien, Jimmy Picket, MD  dapagliflozin propanediol (FARXIGA) 10 MG TABS tablet Take 10 mg by mouth daily.   Yes [provider]  furosemide (LASIX) 40 MG tablet Take 1.5 tablets (60 mg total) by mouth daily. Patient taking differently: Take 40 mg by mouth 2 (two) times daily. 04/02/21 05/30/21 Yes Arrien, Jimmy Picket, MD  hydrALAZINE (APRESOLINE) 100 MG tablet Take 1 tablet (100 mg total) by mouth every 8 (eight) hours. 04/02/21 05/30/21 Yes Arrien, Jimmy Picket, MD  isosorbide mononitrate (IMDUR) 30 MG 24 hr tablet Take 1 tablet (30 mg total) by mouth daily. 04/02/21 05/30/21 Yes Arrien, Jimmy Picket, MD  potassium chloride (KLOR-CON) 10 MEQ tablet Take 1 tablet (10 mEq total) by mouth daily. Patient taking differently: Take 10 mEq by mouth 2 (two) times daily. 04/02/21 05/30/21 Yes Arrien, Jimmy Picket, MD  rivaroxaban (XARELTO) 20 MG TABS tablet Take 1 tablet (20 mg total) by mouth daily with supper. 04/02/21 05/30/21 Yes Arrien, Jimmy Picket, MD  sacubitril-valsartan (ENTRESTO) 24-26 MG Take 1 tablet by mouth 2 (two) times daily.   Yes [provider]  spironolactone (ALDACTONE) 25 MG tablet Take 1 tablet (25 mg total) by mouth daily for 30 doses. 04/02/21 05/30/21 Yes Arrien, Jimmy Picket, MD  atorvastatin (LIPITOR) 40 MG tablet Take 1 tablet (40 mg total) by mouth daily. Patient not taking: Reported on 05/30/2021 04/02/21 05/02/21  Arrien, Jimmy Picket, MD    Allergies    Patient has no known allergies.  Review of Systems   Review of Systems  Constitutional:  Negative for fever.  Eyes:  Negative for visual disturbance.  Respiratory:  Positive for cough and shortness of breath.   Cardiovascular:  Positive for chest pain. Negative for leg swelling.  Gastrointestinal:  Negative for abdominal pain, diarrhea, nausea and vomiting.  Genitourinary:  Negative for dysuria.  Neurological:  Positive for light-headedness. Negative for weakness and numbness.   Physical Exam Updated Vital Signs BP (!) 165/100 (BP Location: Left Arm)   Pulse (!) 110   Temp 98.6 F (37 C) (Oral)  Resp 18   Ht '5\' 11"'$  (1.803 m)   Wt 132.9 kg   SpO2 96%   BMI 40.88 kg/m   Physical Exam Vitals and nursing note reviewed.  Constitutional:      General: He is not in acute distress.    Appearance: He is well-developed. He is ill-appearing and toxic-appearing. He is not diaphoretic.  HENT:     Head: Normocephalic and atraumatic.  Eyes:     Conjunctiva/sclera: Conjunctivae normal.  Cardiovascular:     Rate and Rhythm: Regular rhythm. Tachycardia present.     Heart sounds: Normal heart sounds. No murmur heard.   No friction rub. No gallop.  Pulmonary:     Effort: Pulmonary effort is normal. No respiratory distress.     Breath sounds: Normal breath sounds. No wheezing or rales.  Abdominal:     General: There is no distension.     Palpations: Abdomen is soft.     Tenderness: There is no abdominal tenderness. There is no guarding.  Musculoskeletal:     Cervical back: Normal range of motion.  Skin:    General: Skin is warm and dry.   Neurological:     Mental Status: He is alert and oriented to person, place, and time.    ED Results / Procedures / Treatments   Labs (all labs ordered are listed, but only abnormal results are displayed) Labs Reviewed  CBC WITH DIFFERENTIAL/PLATELET - Abnormal; Notable for the following components:      Result Value   Hemoglobin 11.9 (*)    MCV 70.1 (*)    MCH 21.3 (*)    RDW 18.2 (*)    All other components within normal limits  COMPREHENSIVE METABOLIC PANEL - Abnormal; Notable for the following components:   Potassium 3.0 (*)    CO2 20 (*)    Glucose, Bld 116 (*)    BUN 26 (*)    Creatinine, Ser 2.15 (*)    Total Bilirubin 1.5 (*)    GFR, Estimated 39 (*)    All other components within normal limits  BRAIN NATRIURETIC PEPTIDE - Abnormal; Notable for the following components:   B Natriuretic Peptide >4,500.0 (*)    All other components within normal limits  TROPONIN I (HIGH SENSITIVITY) - Abnormal; Notable for the following components:   Troponin I (High Sensitivity) 780 (*)    All other components within normal limits  TROPONIN I (HIGH SENSITIVITY) - Abnormal; Notable for the following components:   Troponin I (High Sensitivity) 799 (*)    All other components within normal limits  RESP PANEL BY RT-PCR (FLU A&B, COVID) ARPGX2  CULTURE, BLOOD (ROUTINE X 2)  CULTURE, BLOOD (ROUTINE X 2)  EXPECTORATED SPUTUM ASSESSMENT W GRAM STAIN, RFLX TO RESP C  LACTIC ACID, PLASMA  LACTIC ACID, PLASMA  MAGNESIUM  BASIC METABOLIC PANEL  CBC  MAGNESIUM    EKG EKG Interpretation  Date/Time:  Thursday May 30 2021 06:41:41 EDT Ventricular Rate:  119 PR Interval:  184 QRS Duration: 152 QT Interval:  362 QTC Calculation: 509 R Axis:   -69 Text Interpretation: Sinus tachycardia with Premature atrial complexes Left axis deviation Left bundle branch block Abnormal ECG No significant change since last tracing Confirmed by Ripley Fraise [phone]) on 05/30/2021 6:46:22  AM  Radiology DG Chest 1 View  Result Date: 05/30/2021 CLINICAL DATA:  Chest pain with shortness of breath and hematemesis EXAM: CHEST  1 VIEW COMPARISON:  04/02/2021 FINDINGS: Extensive airspace disease on the right. Chronic cardiomegaly and  vascular pedicle widening. No Kerley lines, effusion, or pneumothorax. IMPRESSION: Extensive airspace disease on the right. Chronic cardiomegaly. Electronically Signed   By: Jorje Guild M.D.   On: 05/30/2021 07:13    Procedures .Critical Care Performed by: Gareth Morgan, MD Authorized by: Gareth Morgan, MD   Critical care provider statement:    Critical care time (minutes):  30   Critical care was necessary to treat or prevent imminent or life-threatening deterioration of the following conditions:  Circulatory failure   Medications Ordered in ED Medications  aspirin EC tablet 81 mg (81 mg Oral Given 05/30/21 1302)  atorvastatin (LIPITOR) tablet 40 mg (40 mg Oral Given 05/30/21 1302)  hydrALAZINE (APRESOLINE) tablet 100 mg (100 mg Oral Given 05/30/21 2115)  isosorbide mononitrate (IMDUR) 24 hr tablet 30 mg (30 mg Oral Given 05/30/21 1302)  spironolactone (ALDACTONE) tablet 25 mg (25 mg Oral Given 05/30/21 1302)  furosemide (LASIX) injection 40 mg (40 mg Intravenous Given 05/30/21 2226)  acetaminophen (TYLENOL) tablet 650 mg (650 mg Oral Given 05/30/21 1816)    Or  acetaminophen (TYLENOL) suppository 650 mg ( Rectal See Alternative 05/30/21 1816)  oxyCODONE (Oxy IR/ROXICODONE) immediate release tablet 5 mg (5 mg Oral Given 05/30/21 1302)  morphine 2 MG/ML injection 2 mg (has no administration in time range)  docusate sodium (COLACE) capsule 100 mg (100 mg Oral Patient Refused/Not Given 05/30/21 2118)  polyethylene glycol (MIRALAX / GLYCOLAX) packet 17 g (has no administration in time range)  bisacodyl (DULCOLAX) EC tablet 5 mg (has no administration in time range)  ondansetron (ZOFRAN) tablet 4 mg (has no administration in time range)     Or  ondansetron (ZOFRAN) injection 4 mg (has no administration in time range)  nicotine (NICODERM CQ - dosed in mg/24 hours) patch 14 mg (14 mg Transdermal Not Given 05/30/21 1304)  hydrALAZINE (APRESOLINE) injection 5 mg (has no administration in time range)  cefTRIAXone (ROCEPHIN) 2 g in sodium chloride 0.9 % 100 mL IVPB (has no administration in time range)  azithromycin (ZITHROMAX) 500 mg in sodium chloride 0.9 % 250 mL IVPB (500 mg Intravenous New Bag/Given 05/30/21 2138)  sodium chloride flush (NS) 0.9 % injection 3 mL (3 mLs Intravenous Given 05/30/21 2116)  budesonide (PULMICORT) nebulizer solution 0.25 mg (0.25 mg Nebulization Given 05/30/21 1918)  0.9 %  sodium chloride infusion (250 mLs Intravenous New Bag/Given 05/30/21 2132)  amiodarone (NEXTERONE) 1.8 mg/mL load via infusion 150 mg (150 mg Intravenous Bolus from Bag 05/30/21 0748)  cefTRIAXone (ROCEPHIN) 1 g in sodium chloride 0.9 % 100 mL IVPB (0 g Intravenous Stopped 05/30/21 0832)  aspirin chewable tablet 324 mg (324 mg Oral Given 05/30/21 0830)  doxycycline (VIBRAMYCIN) 100 mg in sodium chloride 0.9 % 250 mL IVPB (0 mg Intravenous Stopped 05/30/21 1020)  furosemide (LASIX) injection 60 mg (60 mg Intravenous Given 05/30/21 0841)  potassium chloride SA (KLOR-CON) CR tablet 40 mEq (40 mEq Oral Given 05/30/21 0830)  hydrALAZINE (APRESOLINE) injection 10 mg (10 mg Intravenous Given 05/30/21 1020)  lidocaine (PF) (XYLOCAINE) 1 % injection 5 mL (5 mLs Intradermal Given by Other 05/30/21 1020)    ED Course  I have reviewed the triage vital signs and the nursing notes.  Pertinent labs & imaging results that were available during my care of the patient were reviewed by me and considered in my medical decision making (see chart for details).    MDM Rules/Calculators/A&P HEAR Score: 3  40 year old male with a history of CHF, hypertension, atrial fibrillation on Xarelto, polysubstance abuse, prior  admissions for pneumonia and CHF, presents with similar symptoms of chest pain, shortness of breath, cough.  On my evaluation at bedside, found to be in wide complex tachycardia with lightheadedness, CP and hypertensive blood pressures. Due to concern for possible VT, placed pads and gave amiodarone.  Reviewed previous ECG which are similar. Discussed with Dr. Sallyanne Kuster given appearance, who reviewed ECG and initially recommends continuing amiodarone gtt and will consult.  Differential diagnosis for dyspnea includes ACS, PE, COPD exacerbation, CHF exacerbation, anemia, pneumonia, viral etiology such as COVID 19 infection, metabolic abnormality.  Chest x-ray was done which showed right sided opacities, suspect likely pneumonia.BNP was again elevated over 4500.  Troponin 700s but similar on recheck.  I did see him also in August and obtained CT PE study at that time with similar symptoms which was negative for PE and do not feel repeat CT PE study indicated at this time, and suspect hemoptysis in setting of pneumonia and CHF.  Suspect likely combination of CHF in setting of nonadherence to medications and pneumonia.  Admitted to hospitalist with Cardiology consult> Given diuretic, aspirin, nitro, hydralazine, K.   Final Clinical Impression(s) / ED Diagnoses Final diagnoses:  Acute on chronic congestive heart failure, unspecified heart failure type ([place])  Community acquired pneumonia of right lung, unspecified part of lung  Elevated troponin    Rx / DC Orders ED Discharge Orders     None        Gareth Morgan, MD 05/30/21 2317

## 2021-05-30 NOTE — ED Triage Notes (Signed)
Per EMS. Pt from home, c/o blood in emesis, cough and nasal congestion w/ 1 day.  Denies fevers.  Chest pain from cough.  Pt has CHF 249/184 Hr 115 O2 95% RA

## 2021-05-30 NOTE — ED Notes (Signed)
Dr.Yates notified of pt bp. Pt remains alert and oriented x 4. Will continue to monitor.

## 2021-05-30 NOTE — Plan of Care (Signed)

## 2021-05-31 DIAGNOSIS — I480 Paroxysmal atrial fibrillation: Secondary | ICD-10-CM

## 2021-05-31 DIAGNOSIS — N1832 Chronic kidney disease, stage 3b: Secondary | ICD-10-CM

## 2021-05-31 LAB — EXPECTORATED SPUTUM ASSESSMENT W GRAM STAIN, RFLX TO RESP C

## 2021-05-31 LAB — CBC
HCT: 34.3 % — ABNORMAL LOW (ref 39.0–52.0)
Hemoglobin: 11 g/dL — ABNORMAL LOW (ref 13.0–17.0)
MCH: 21.7 pg — ABNORMAL LOW (ref 26.0–34.0)
MCHC: 32.1 g/dL (ref 30.0–36.0)
MCV: 67.5 fL — ABNORMAL LOW (ref 80.0–100.0)
Platelets: 219 10*3/uL (ref 150–400)
RBC: 5.08 MIL/uL (ref 4.22–5.81)
RDW: 17.3 % — ABNORMAL HIGH (ref 11.5–15.5)
WBC: 11.3 10*3/uL — ABNORMAL HIGH (ref 4.0–10.5)
nRBC: 0 % (ref 0.0–0.2)

## 2021-05-31 LAB — BASIC METABOLIC PANEL
Anion gap: 11 (ref 5–15)
BUN: 23 mg/dL — ABNORMAL HIGH (ref 6–20)
CO2: 22 mmol/L (ref 22–32)
Calcium: 8.4 mg/dL — ABNORMAL LOW (ref 8.9–10.3)
Chloride: 103 mmol/L (ref 98–111)
Creatinine, Ser: 2.16 mg/dL — ABNORMAL HIGH (ref 0.61–1.24)
GFR, Estimated: 39 mL/min — ABNORMAL LOW (ref >60)
Glucose, Bld: 120 mg/dL — ABNORMAL HIGH (ref 70–99)
Potassium: 2.7 mmol/L — CL (ref 3.5–5.1)
Sodium: 136 mmol/L (ref 135–145)

## 2021-05-31 LAB — MAGNESIUM: Magnesium: 1.8 mg/dL (ref 1.7–2.4)

## 2021-05-31 MED ORDER — RIVAROXABAN 15 MG PO TABS: 15.0000 mg | ORAL_TABLET | Freq: Every day | ORAL | Status: DC

## 2021-05-31 MED ORDER — POTASSIUM CHLORIDE CRYS ER 20 MEQ PO TBCR
40.0000 meq | EXTENDED_RELEASE_TABLET | ORAL | Status: AC
  Administered 2021-05-31 (×2): 40 meq via ORAL
  Filled 2021-05-31: qty 2
  Filled 2021-05-31: qty 4

## 2021-05-31 MED ORDER — GUAIFENESIN-CODEINE 100-10 MG/5ML PO SOLN
10.0000 mL | ORAL | Status: DC | PRN
  Administered 2021-06-01: 10 mL via ORAL
  Filled 2021-05-31: qty 10

## 2021-05-31 MED ORDER — CARVEDILOL 12.5 MG PO TABS
12.5000 mg | ORAL_TABLET | Freq: Two times a day (BID) | ORAL | Status: DC
  Administered 2021-05-31 – 2021-06-01 (×3): 12.5 mg via ORAL
  Filled 2021-05-31 (×3): qty 1

## 2021-05-31 MED ORDER — POTASSIUM CHLORIDE CRYS ER 20 MEQ PO TBCR
40.0000 meq | EXTENDED_RELEASE_TABLET | Freq: Two times a day (BID) | ORAL | Status: AC
  Administered 2021-05-31 – 2021-06-01 (×4): 40 meq via ORAL
  Filled 2021-05-31 (×4): qty 2

## 2021-05-31 MED ORDER — RIVAROXABAN 20 MG PO TABS
20.0000 mg | ORAL_TABLET | Freq: Every day | ORAL | Status: DC
  Administered 2021-05-31 – 2021-06-03 (×4): 20 mg via ORAL
  Filled 2021-05-31 (×4): qty 1

## 2021-05-31 MED ORDER — IRBESARTAN 75 MG PO TABS
37.5000 mg | ORAL_TABLET | Freq: Every day | ORAL | Status: DC
  Administered 2021-05-31 – 2021-06-01 (×2): 37.5 mg via ORAL
  Filled 2021-05-31 (×2): qty 0.5

## 2021-05-31 NOTE — Plan of Care (Signed)

## 2021-05-31 NOTE — Progress Notes (Signed)
Noted increased ST elevation from 13m to 860mstrip saved, ekg done and MD made aware.

## 2021-05-31 NOTE — Progress Notes (Addendum)
Progress Note  Patient Name: Gerald [name] Date of Encounter: 05/31/2021  [facility] Cardiologist: Ida Rogue, MD   Subjective   Denies any CP, still having SOB.   Inpatient Medications    Scheduled Meds:  aspirin EC  81 mg Oral Daily   atorvastatin  40 mg Oral Daily   budesonide (PULMICORT) nebulizer solution  0.25 mg Nebulization BID   docusate sodium  100 mg Oral BID   furosemide  40 mg Intravenous BID   hydrALAZINE  100 mg Oral Q8H   isosorbide mononitrate  30 mg Oral Daily   nicotine  14 mg Transdermal Daily   potassium chloride  40 mEq Oral Q4H   sodium chloride flush  3 mL Intravenous Q12H   spironolactone  25 mg Oral Daily   Continuous Infusions:  sodium chloride Stopped (05/30/21 2343)   azithromycin Stopped (05/30/21 2307)   cefTRIAXone (ROCEPHIN)  IV     PRN Meds: sodium chloride, acetaminophen **OR** acetaminophen, bisacodyl, hydrALAZINE, melatonin, morphine injection, ondansetron **OR** ondansetron (ZOFRAN) IV, oxyCODONE, polyethylene glycol   Vital Signs    Vitals:   05/30/21 1918 05/30/21 2040 05/31/21 0001 05/31/21 0439  BP:  (!) 165/100 (!) 152/118 (!) 179/122  Pulse:  (!) 110 (!) 104 84  Resp:  '18 20 19  '$ Temp:  98.6 F (37 C) 99 F (37.2 C) 99 F (37.2 C)  TempSrc:  Oral Oral Oral  SpO2: 99% 96% 95% 98%  Weight:    133.4 kg  Height:        Intake/Output Summary (Last 24 hours) at 05/31/2021 0801 Last data filed at 05/31/2021 0440 Gross per 24 hour  Intake 1567.75 ml  Output 2900 ml  Net -1332.25 ml   Last 3 Weights 05/31/2021 05/30/2021 05/30/2021  Weight (lbs) 294 lb 3.2 oz 293 lb 1.6 oz 280 lb  Weight (kg) 133.448 kg 132.949 kg 127.007 kg      Telemetry    Wide complex tachycardia, reviewed with Dr. Sallyanne Kuster, who felt it is sinus tachycardia with underlying bundle branch blocker  - Personally Reviewed  ECG    Sinus tachycardia with LBBB - Personally Reviewed  Physical Exam   GEN: No acute distress.   Neck: No  JVD Cardiac: RRR, no murmurs, rubs, or gallops.  Respiratory: Clear to auscultation bilaterally. GI: Soft, nontender, non-distended  MS: No edema; No deformity. Neuro:  Nonfocal  Psych: Normal affect   Labs    High Sensitivity Troponin:   Recent Labs  Lab 05/30/21 0648 05/30/21 0822  TROPONINIHS 780* 799*     Chemistry Recent Labs  Lab 05/30/21 0648 05/30/21 0822 05/31/21 0412  NA 137  --  136  K 3.0*  --  2.7*  CL 105  --  103  CO2 20*  --  22  GLUCOSE 116*  --  120*  BUN 26*  --  23*  CREATININE 2.15*  --  2.16*  CALCIUM 9.1  --  8.4*  MG  --  2.1 1.8  PROT 7.3  --   --   ALBUMIN 3.6  --   --   AST 30  --   --   ALT 34  --   --   ALKPHOS 82  --   --   BILITOT 1.5*  --   --   GFRNONAA 39*  --  39*  ANIONGAP 12  --  11    Lipids No results for input(s): CHOL, TRIG, HDL, LABVLDL, LDLCALC, CHOLHDL in the  last 168 hours.  Hematology Recent Labs  Lab 05/30/21 0648 05/31/21 0412  WBC 9.1 11.3*  RBC 5.58 5.08  HGB 11.9* 11.0*  HCT 39.1 34.3*  MCV 70.1* 67.5*  MCH 21.3* 21.7*  MCHC 30.4 32.1  RDW 18.2* 17.3*  PLT 222 219   Thyroid No results for input(s): TSH, FREET4 in the last 168 hours.  BNP Recent Labs  Lab 05/30/21 0648  BNP >4,500.0*    DDimer No results for input(s): DDIMER in the last 168 hours.   Radiology    DG Chest 1 View  Result Date: 05/30/2021 CLINICAL DATA:  Chest pain with shortness of breath and hematemesis EXAM: CHEST  1 VIEW COMPARISON:  04/02/2021 FINDINGS: Extensive airspace disease on the right. Chronic cardiomegaly and vascular pedicle widening. No Kerley lines, effusion, or pneumothorax. IMPRESSION: Extensive airspace disease on the right. Chronic cardiomegaly. Electronically Signed   By: Jorje Guild M.D.   On: 05/30/2021 07:13    Cardiac Studies   Echo 01/20/2021  1. Left ventricular ejection fraction, by estimation, is 30 to 35%. The  left ventricle has moderately decreased function. The left ventricle  demonstrates  global hypokinesis. The left ventricular internal cavity size  was mildly dilated. There is severe  left ventricular hypertrophy. Left ventricular diastolic parameters are  indeterminate.   2. Right ventricular systolic function is normal. The right ventricular  size is normal. Moderately increased right ventricular wall thickness.  Tricuspid regurgitation signal is inadequate for assessing PA pressure.   3. Left atrial size was severely dilated.   4. Right atrial size was mildly dilated.   5. The mitral valve is normal in structure. Trivial mitral valve  regurgitation.   6. The aortic valve is tricuspid. Aortic valve regurgitation is trivial.  No aortic stenosis is present.   7. The inferior vena cava is dilated in size with >50% respiratory  variability, suggesting right atrial pressure of 8 mmHg.   Patient Profile     40 y.o. male with PMH of chronic combined systolic and diastolic CHF, normal cors on cath 12/2020, atrial flutter s/p TEE DCCV, HTN, polysubstance abuse, tobacco abuse, obesity who presented with acute CHF and atrial flutter  Assessment & Plan    Acute on chronic combined systolic and diastolic heart failure  - Echo 01/20/2021 showed EF 30-35%, severe LVH.  - off of all meds for 1.5 weeks. Used cocaine 2 days prior to arrival  - likely due to combination of cocaine and questionable compliance.   - previous dry weight was around 125 kg in June 2022, current weight is 133 kg. His morbid obesity makes accurate volume assessment difficult, however on exam, he does not appears to be significantly volume overloaded. Renal function is stable. Recommend continue IV diuresis while paying close attention of renal function  - on spironolactone, Imdur/hydralazine, consider restart Coreg since he is near euvolemic level which will also help with heart rate. He is also on entresto at home, may consider restarting if he can demonstrate compliance.   Atrial flutter: initially placed on IV  amiodarone given wide complex tachycardia on telemetry, amiodarone was taken off as rhythm was felt to be atrial tachycardia with LBBB. Given normal coronary arteries, consider stop aspirin and restart home Xartelto  Elevated trop: likely demand ischemia  Hypokalemia: repleted. K 2.7 this morning.  NICM: normal cors on cath 12/2020. Not a candidate for CRT-D given active cocaine use  CKD stage III: Cr 2.1 at baseline  HTN  Polysubstance abuse: used  cocaine 2 days prior to arrival  Questionable compliance      For questions or updates, please contact [place] Please consult [email or web address] for contact info under        Signed, Almyra Deforest, [place]  05/31/2021, 8:01 AM    I have seen and examined the patient along with Almyra Deforest, PA.  I have reviewed the chart, notes and new data.  I agree with PA/NP's note.  Key new complaints: Improved, but still dyspneic.  No angina. Key examination changes: Exam difficult due to obesity, but less signs of overt hypervolemia.  Severely elevated blood pressure remains an issue. Key new findings / data: Creatinine stable around 2.1.  Markedly hypokalemic.  PLAN: Restart carvedilol for severely elevated blood pressure despite relatively high-dose vasodilators and spironolactone.  Resume Xarelto.  Stop aspirin.  Discussed the potentially lethal effect of cocaine and he appears to truly comprehend how harmful this is to him.  Expresses remorse.  Sanda Klein, MD, [place] [phone] 05/31/2021, 9:15 AM

## 2021-05-31 NOTE — Progress Notes (Signed)
PROGRESS NOTE    Gerald [name]  [id number] DOB: September 29, 1980 DOA: 05/30/2021 PCP: Patient, [name] (Inactive)    Brief Narrative:  40 year old chronic combined heart failure, CKD stage IIIb, A. fib status post cardioversion on Xarelto, hypertension, polysubstance abuse and morbid obesity presented with hemoptysis for 1 day.  Ran out of medication for about 2 weeks.  Gained 20 pounds of weight in 1 week.  In the emergency room found to be fluid overloaded, BNP markedly elevated, wide-complex concerning for VT/started on amiodarone.  Also given antibiotics.   Assessment & Plan:   Principal Problem:   Acute on chronic combined systolic and diastolic CHF (congestive heart failure) ([name]) Active Problems:   Essential hypertension   PAF (paroxysmal atrial fibrillation) ([name])   CKD (chronic kidney disease), stage III ([name])   Polysubstance abuse ([name])   Obesity, Class III, BMI 40-49.9 (morbid obesity) ([place])  Acute on chronic combined systolic and diastolic congestive heart failure: Related to medication noncompliance and smoking and snorting cocaine. Currently on IV diuresis 40 mg IV twice daily with good response.  Renal functions are stable. On hydralazine and nitrates. Patient on carvedilol at home, restarted. Restarted on Aldactone and irbesartan. Started on presumptive antibiotics suspected pneumonia, will continue for 48 hours and reassess.  Hypertension: Severely hypertensive.  Blood pressures better on hydralazine and Imdur.  Cocaine abuse: Counseled.  He wants to quit.  Hemoptysis: Due to pulmonary edema along with snorting cocaine.  Stable.  Abnormal rhythm: Suspect a flutter with variable AV block or PACs:  Currently remains a stable.  Patient is restarted on Xarelto.  Restarted on carvedilol.  CKD stage IIIb: At about baseline.  Hypokalemia: Replaced aggressively.  We will continue to replace.  We will check magnesium.  Elevated troponins: Demand  ischemia.   DVT prophylaxis: SCDs Start: 05/30/21 1109 rivaroxaban (XARELTO) tablet 20 mg   Code Status: Full code Family Communication: None Disposition Plan: Status is: Inpatient  Remains inpatient appropriate because: Severity of symptoms.         Consultants:  Cardiology Pulmonary  Procedures:  None  Antimicrobials:  Rocephin azithromycin 10/13---   Subjective: Patient seen and examined.  Feels slightly better since last night.  He still having chest tightness and shortness of breath.  He is very motivated.  He wants to quit smoking and stop snorting cocaine and go back on medications.  He talks about getting back to his good life and taking medications.  Objective: Vitals:   05/31/21 0001 05/31/21 0439 05/31/21 0830 05/31/21 1044  BP: (!) 152/118 (!) 179/122  139/86  Pulse: (!) 104 84  86  Resp: '20 19  20  '$ Temp: 99 F (37.2 C) 99 F (37.2 C)  98.9 F (37.2 C)  TempSrc: Oral Oral  Oral  SpO2: 95% 98% 98% 94%  Weight:  133.4 kg    Height:        Intake/Output Summary (Last 24 hours) at 05/31/2021 1139 Last data filed at 05/31/2021 0932 Gross per 24 hour  Intake 1235.76 ml  Output 2100 ml  Net -864.24 ml   Filed Weights   05/30/21 0633 05/30/21 1221 05/31/21 0439  Weight: 127 kg 132.9 kg 133.4 kg    Examination:  General exam: Appears in mild distress and short of breath.  On room air. Respiratory system: Bilateral basal crackles. Cardiovascular system: S1 & S2 heard, RRR.  No peripheral edema.   Gastrointestinal system: Obese and pendulous.  Bowel sounds present.. Central nervous system: Alert and  oriented. No focal neurological deficits. Extremities: Symmetric 5 x 5 power. Skin: No rashes, lesions or ulcers Psychiatry: Judgement and insight appear normal. Mood & affect appropriate.     Data Reviewed: I have personally reviewed following labs and imaging studies  CBC: Recent Labs  Lab 05/30/21 0648 05/31/21 0412  WBC 9.1 11.3*   NEUTROABS 6.8  --   HGB 11.9* 11.0*  HCT 39.1 34.3*  MCV 70.1* 67.5*  PLT 222 [id number]   Basic Metabolic Panel: Recent Labs  Lab 05/30/21 0648 05/30/21 0822 05/31/21 0412  NA 137  --  136  K 3.0*  --  2.7*  CL 105  --  103  CO2 20*  --  22  GLUCOSE 116*  --  120*  BUN 26*  --  23*  CREATININE 2.15*  --  2.16*  CALCIUM 9.1  --  8.4*  MG  --  2.1 1.8   GFR: Estimated Creatinine Clearance: 63.3 mL/min (A) (by C-G formula based on SCr of 2.16 mg/dL (H)). Liver Function Tests: Recent Labs  Lab 05/30/21 0648  AST 30  ALT 34  ALKPHOS 82  BILITOT 1.5*  PROT 7.3  ALBUMIN 3.6   No results for input(s): LIPASE, AMYLASE in the last 168 hours. No results for input(s): AMMONIA in the last 168 hours. Coagulation Profile: No results for input(s): INR, PROTIME in the last 168 hours. Cardiac Enzymes: No results for input(s): CKTOTAL, CKMB, CKMBINDEX, TROPONINI in the last 168 hours. BNP (last 3 results) No results for input(s): PROBNP in the last 8760 hours. HbA1C: No results for input(s): HGBA1C in the last 72 hours. CBG: No results for input(s): GLUCAP in the last 168 hours. Lipid Profile: No results for input(s): CHOL, HDL, LDLCALC, TRIG, CHOLHDL, LDLDIRECT in the last 72 hours. Thyroid Function Tests: No results for input(s): TSH, T4TOTAL, FREET4, T3FREE, THYROIDAB in the last 72 hours. Anemia Panel: No results for input(s): VITAMINB12, FOLATE, FERRITIN, TIBC, IRON, RETICCTPCT in the last 72 hours. Sepsis Labs: Recent Labs  Lab 05/30/21 0803 05/30/21 1016  LATICACIDVEN 1.8 1.4    Recent Results (from the past 240 hour(s))  Resp Panel by RT-PCR (Flu A&B, Covid) Nasopharyngeal Swab     Status: None   Collection Time: 05/30/21  6:48 AM   Specimen: Nasopharyngeal Swab; Nasopharyngeal(NP) swabs in vial transport medium  Result Value Ref Range Status   SARS Coronavirus 2 by RT PCR NEGATIVE NEGATIVE Final    Comment: (NOTE) SARS-CoV-2 target nucleic acids are NOT  DETECTED.  The SARS-CoV-2 RNA is generally detectable in upper respiratory specimens during the acute phase of infection. The lowest concentration of SARS-CoV-2 viral copies this assay can detect is 138 copies/mL. A negative result does not preclude SARS-Cov-2 infection and should not be used as the sole basis for treatment or other patient management decisions. A negative result may occur with  improper specimen collection/handling, submission of specimen other than nasopharyngeal swab, presence of viral mutation(s) within the areas targeted by this assay, and inadequate number of viral copies(<138 copies/mL). A negative result must be combined with clinical observations, patient history, and epidemiological information. The expected result is Negative.  Fact Sheet for Patients:  EntrepreneurPulse.com.au  Fact Sheet for Healthcare Providers:  IncredibleEmployment.be  This test is no t yet approved or cleared by the [place] FDA and  has been authorized for detection and/or diagnosis of SARS-CoV-2 by FDA under an Emergency Use Authorization (EUA). This EUA will remain  in effect (meaning this test can be  used) for the duration of the COVID-19 declaration under Section 564(b)(1) of the Act, 21 U.S.C.section 360bbb-3(b)(1), unless the authorization is terminated  or revoked sooner.       Influenza A by PCR NEGATIVE NEGATIVE Final   Influenza B by PCR NEGATIVE NEGATIVE Final    Comment: (NOTE) The Xpert Xpress SARS-CoV-2/FLU/RSV plus assay is intended as an aid in the diagnosis of influenza from Nasopharyngeal swab specimens and should not be used as a sole basis for treatment. Nasal washings and aspirates are unacceptable for Xpert Xpress SARS-CoV-2/FLU/RSV testing.  Fact Sheet for Patients: EntrepreneurPulse.com.au  Fact Sheet for Healthcare Providers: IncredibleEmployment.be  This test is not yet  approved or cleared by the [place] FDA and has been authorized for detection and/or diagnosis of SARS-CoV-2 by FDA under an Emergency Use Authorization (EUA). This EUA will remain in effect (meaning this test can be used) for the duration of the COVID-19 declaration under Section 564(b)(1) of the Act, 21 U.S.C. section 360bbb-3(b)(1), unless the authorization is terminated or revoked.  Performed at [facility], [place] [address], [place], [place] [address]   Blood culture (routine x 2)     Status: None (Preliminary result)   Collection Time: 05/30/21  8:20 AM   Specimen: BLOOD RIGHT HAND  Result Value Ref Range Status   Specimen Description BLOOD RIGHT HAND  Final   Special Requests   Final    BOTTLES DRAWN AEROBIC AND ANAEROBIC Blood Culture results may not be optimal due to an inadequate volume of blood received in culture bottles   Culture   Final    NO GROWTH 1 DAY Performed at [facility], [place] [address], [place], [place] [address]    Report Status PENDING  Incomplete  Blood culture (routine x 2)     Status: None (Preliminary result)   Collection Time: 05/30/21  8:22 AM   Specimen: BLOOD  Result Value Ref Range Status   Specimen Description BLOOD LEFT ANTECUBITAL  Final   Special Requests   Final    BOTTLES DRAWN AEROBIC AND ANAEROBIC Blood Culture adequate volume   Culture   Final    NO GROWTH 1 DAY Performed at [facility], [place] [address] [place], [place] [address]    Report Status PENDING  Incomplete         Radiology Studies: DG Chest 1 View  Result Date: 05/30/2021 CLINICAL DATA:  Chest pain with shortness of breath and hematemesis EXAM: CHEST  1 VIEW COMPARISON:  04/02/2021 FINDINGS: Extensive airspace disease on the right. Chronic cardiomegaly and vascular pedicle widening. No Kerley lines, effusion, or pneumothorax. IMPRESSION: Extensive airspace disease on the right. Chronic cardiomegaly. Electronically Signed   By: Jorje Guild  M.D.   On: 05/30/2021 07:13        Scheduled Meds:  atorvastatin  40 mg Oral Daily   budesonide (PULMICORT) nebulizer solution  0.25 mg Nebulization BID   carvedilol  12.5 mg Oral BID WC   docusate sodium  100 mg Oral BID   furosemide  40 mg Intravenous BID   hydrALAZINE  100 mg Oral Q8H   irbesartan  37.5 mg Oral Daily   isosorbide mononitrate  30 mg Oral Daily   nicotine  14 mg Transdermal Daily   rivaroxaban  20 mg Oral Daily   sodium chloride flush  3 mL Intravenous Q12H   spironolactone  25 mg Oral Daily   Continuous Infusions:  sodium chloride Stopped (05/30/21 2343)  azithromycin Stopped (05/30/21 2307)   cefTRIAXone (ROCEPHIN)  IV 2 g (05/31/21 0931)     LOS: 1 day    Time spent: 35 minutes    Barb Merino, MD [facility] Pager [phone]

## 2021-05-31 NOTE — Progress Notes (Signed)
Critical potassium called in from lab 2.7 MD made aware

## 2021-05-31 NOTE — Plan of Care (Signed)
  Problem: Skin Integrity: Goal: Risk for impaired skin integrity will decrease Outcome: Progressing   

## 2021-06-01 LAB — BASIC METABOLIC PANEL
Anion gap: 11 (ref 5–15)
BUN: 26 mg/dL — ABNORMAL HIGH (ref 6–20)
CO2: 22 mmol/L (ref 22–32)
Calcium: 8.4 mg/dL — ABNORMAL LOW (ref 8.9–10.3)
Chloride: 105 mmol/L (ref 98–111)
Creatinine, Ser: 2.15 mg/dL — ABNORMAL HIGH (ref 0.61–1.24)
GFR, Estimated: 39 mL/min — ABNORMAL LOW (ref >60)
Glucose, Bld: 96 mg/dL (ref 70–99)
Potassium: 3.5 mmol/L (ref 3.5–5.1)
Sodium: 138 mmol/L (ref 135–145)

## 2021-06-01 LAB — MAGNESIUM: Magnesium: 2 mg/dL (ref 1.7–2.4)

## 2021-06-01 MED ORDER — SACUBITRIL-VALSARTAN 49-51 MG PO TABS: 1.0000 | ORAL_TABLET | Freq: Two times a day (BID) | ORAL | Status: DC

## 2021-06-01 MED ORDER — EMPAGLIFLOZIN 10 MG PO TABS
10.0000 mg | ORAL_TABLET | Freq: Every day | ORAL | Status: DC
  Administered 2021-06-01 – 2021-06-03 (×3): 10 mg via ORAL
  Filled 2021-06-01 (×3): qty 1

## 2021-06-01 MED ORDER — FUROSEMIDE 10 MG/ML IJ SOLN: 40.0000 mg | Freq: Two times a day (BID) | INTRAMUSCULAR | Status: DC

## 2021-06-01 MED ORDER — FUROSEMIDE 10 MG/ML IJ SOLN
80.0000 mg | Freq: Once | INTRAMUSCULAR | Status: AC
  Administered 2021-06-01: 80 mg via INTRAVENOUS
  Filled 2021-06-01: qty 8

## 2021-06-01 MED ORDER — CARVEDILOL 25 MG PO TABS
25.0000 mg | ORAL_TABLET | Freq: Two times a day (BID) | ORAL | Status: DC
  Administered 2021-06-01 – 2021-06-03 (×4): 25 mg via ORAL
  Filled 2021-06-01 (×4): qty 1

## 2021-06-01 MED ORDER — ISOSORBIDE MONONITRATE ER 60 MG PO TB24
60.0000 mg | ORAL_TABLET | Freq: Every day | ORAL | Status: DC
  Administered 2021-06-02 – 2021-06-03 (×2): 60 mg via ORAL
  Filled 2021-06-01 (×2): qty 1

## 2021-06-01 NOTE — Progress Notes (Signed)
PROGRESS NOTE    BARNARD SAFIER II  [id number] DOB: Jun 11, 1981 DOA: 05/30/2021 PCP: Patient, [name] (Inactive)    Brief Narrative:  40 year old chronic combined heart failure, CKD stage IIIb, A. fib status post cardioversion on Xarelto, hypertension, polysubstance abuse and morbid obesity presented with hemoptysis for 1 day.  Ran out of medication for about 2 weeks.  Gained 20 pounds of weight in 1 week.  In the emergency room found to be fluid overloaded, BNP markedly elevated, wide-complex concerning for VT/started on amiodarone.  Also given antibiotics.   Assessment & Plan:   Principal Problem:   Acute on chronic combined systolic and diastolic CHF (congestive heart failure) ([name]) Active Problems:   Essential hypertension   PAF (paroxysmal atrial fibrillation) ([name])   CKD (chronic kidney disease), stage III ([name])   Polysubstance abuse ([name])   Obesity, Class III, BMI 40-49.9 (morbid obesity) ([place])  Acute on chronic combined systolic and diastolic congestive heart failure: Related to medication noncompliance and smoking and snorting cocaine. Currently on IV diuresis 40 mg IV twice daily with good response.  Renal functions are stable. On hydralazine and nitrates. Patient on carvedilol at home, restarted with increased dose. Restarted on Aldactone and irbesartan.  Cardiology planning for [facility]. Started on presumptive antibiotics suspected pneumonia, no evidence of infection.  Discontinue antibiotics.  Hypertension: Severely hypertensive.  Blood pressures better on hydralazine and Imdur on increased doses.  Cocaine abuse: Counseled.  He wants to quit.  Hemoptysis: Due to pulmonary edema along with snorting cocaine.  Stable.  Improving.  Abnormal rhythm: Suspect a flutter with variable AV block or PACs:  Currently remains a stable.  Patient is restarted on Xarelto.  Restarted on carvedilol.  CKD stage IIIb: At about baseline.  Hypokalemia: Replaced aggressively.  With  the stabilization.  Continue potassium supplement today.  Recheck tomorrow morning.  Magnesium is adequate.  Elevated troponins: Demand ischemia.   DVT prophylaxis: SCDs Start: 05/30/21 1109 rivaroxaban (XARELTO) tablet 20 mg   Code Status: Full code Family Communication: None Disposition Plan: Status is: Inpatient  Remains inpatient appropriate because: Severity of symptoms.         Consultants:  Cardiology Pulmonary  Procedures:  None  Antimicrobials:  Rocephin azithromycin 10/13---   Subjective: Patient seen and examined.  Still has significant shortness of breath on mobility.  Remains on room air at rest.  Denies any chest pain or palpitations.  Objective: Vitals:   06/01/21 0356 06/01/21 0500 06/01/21 0757 06/01/21 0817  BP: (!) 149/99   (!) 145/81  Pulse: 87   71  Resp: 18   20  Temp: 98.7 F (37.1 C)   98.6 F (37 C)  TempSrc: Oral   Oral  SpO2: 95%  96% 95%  Weight:  132 kg    Height:        Intake/Output Summary (Last 24 hours) at 06/01/2021 1142 Last data filed at 06/01/2021 1000 Gross per 24 hour  Intake 1293.95 ml  Output 3336 ml  Net -2042.05 ml   Filed Weights   05/30/21 1221 05/31/21 0439 06/01/21 0500  Weight: 132.9 kg 133.4 kg 132 kg    Examination:  General exam: Appears in mild distress and short of breath at rest on room air. Respiratory system: Bilateral basal crackles mostly at bases.  Difficult to auscultate. Cardiovascular system: S1 & S2 heard, RRR.  No peripheral edema.   Gastrointestinal system: Obese and pendulous.  Bowel sounds present.. Central nervous system: Alert and oriented. No focal neurological  deficits. Extremities: Symmetric 5 x 5 power. Skin: No rashes, lesions or ulcers Psychiatry: Judgement and insight appear normal. Mood & affect appropriate.     Data Reviewed: I have personally reviewed following labs and imaging studies  CBC: Recent Labs  Lab 05/30/21 0648 05/31/21 0412  WBC 9.1 11.3*   NEUTROABS 6.8  --   HGB 11.9* 11.0*  HCT 39.1 34.3*  MCV 70.1* 67.5*  PLT 222 [id number]   Basic Metabolic Panel: Recent Labs  Lab 05/30/21 0648 05/30/21 0822 05/31/21 0412 06/01/21 0338 06/01/21 0736  NA 137  --  136  --  138  K 3.0*  --  2.7*  --  3.5  CL 105  --  103  --  105  CO2 20*  --  22  --  22  GLUCOSE 116*  --  120*  --  96  BUN 26*  --  23*  --  26*  CREATININE 2.15*  --  2.16*  --  2.15*  CALCIUM 9.1  --  8.4*  --  8.4*  MG  --  2.1 1.8 2.0  --    GFR: Estimated Creatinine Clearance: 63.3 mL/min (A) (by C-G formula based on SCr of 2.15 mg/dL (H)). Liver Function Tests: Recent Labs  Lab 05/30/21 0648  AST 30  ALT 34  ALKPHOS 82  BILITOT 1.5*  PROT 7.3  ALBUMIN 3.6   No results for input(s): LIPASE, AMYLASE in the last 168 hours. No results for input(s): AMMONIA in the last 168 hours. Coagulation Profile: No results for input(s): INR, PROTIME in the last 168 hours. Cardiac Enzymes: No results for input(s): CKTOTAL, CKMB, CKMBINDEX, TROPONINI in the last 168 hours. BNP (last 3 results) No results for input(s): PROBNP in the last 8760 hours. HbA1C: No results for input(s): HGBA1C in the last 72 hours. CBG: No results for input(s): GLUCAP in the last 168 hours. Lipid Profile: No results for input(s): CHOL, HDL, LDLCALC, TRIG, CHOLHDL, LDLDIRECT in the last 72 hours. Thyroid Function Tests: No results for input(s): TSH, T4TOTAL, FREET4, T3FREE, THYROIDAB in the last 72 hours. Anemia Panel: No results for input(s): VITAMINB12, FOLATE, FERRITIN, TIBC, IRON, RETICCTPCT in the last 72 hours. Sepsis Labs: Recent Labs  Lab 05/30/21 0803 05/30/21 1016  LATICACIDVEN 1.8 1.4    Recent Results (from the past 240 hour(s))  Resp Panel by RT-PCR (Flu A&B, Covid) Nasopharyngeal Swab     Status: None   Collection Time: 05/30/21  6:48 AM   Specimen: Nasopharyngeal Swab; Nasopharyngeal(NP) swabs in vial transport medium  Result Value Ref Range Status   SARS  Coronavirus 2 by RT PCR NEGATIVE NEGATIVE Final    Comment: (NOTE) SARS-CoV-2 target nucleic acids are NOT DETECTED.  The SARS-CoV-2 RNA is generally detectable in upper respiratory specimens during the acute phase of infection. The lowest concentration of SARS-CoV-2 viral copies this assay can detect is 138 copies/mL. A negative result does not preclude SARS-Cov-2 infection and should not be used as the sole basis for treatment or other patient management decisions. A negative result may occur with  improper specimen collection/handling, submission of specimen other than nasopharyngeal swab, presence of viral mutation(s) within the areas targeted by this assay, and inadequate number of viral copies(<138 copies/mL). A negative result must be combined with clinical observations, patient history, and epidemiological information. The expected result is Negative.  Fact Sheet for Patients:  EntrepreneurPulse.com.au  Fact Sheet for Healthcare Providers:  IncredibleEmployment.be  This test is no t yet approved or cleared  by the [place] and  has been authorized for detection and/or diagnosis of SARS-CoV-2 by FDA under an Emergency Use Authorization (EUA). This EUA will remain  in effect (meaning this test can be used) for the duration of the COVID-19 declaration under Section 564(b)(1) of the Act, 21 U.S.C.section 360bbb-3(b)(1), unless the authorization is terminated  or revoked sooner.       Influenza A by PCR NEGATIVE NEGATIVE Final   Influenza B by PCR NEGATIVE NEGATIVE Final    Comment: (NOTE) The Xpert Xpress SARS-CoV-2/FLU/RSV plus assay is intended as an aid in the diagnosis of influenza from Nasopharyngeal swab specimens and should not be used as a sole basis for treatment. Nasal washings and aspirates are unacceptable for Xpert Xpress SARS-CoV-2/FLU/RSV testing.  Fact Sheet for  Patients: EntrepreneurPulse.com.au  Fact Sheet for Healthcare Providers: IncredibleEmployment.be  This test is not yet approved or cleared by the [place] FDA and has been authorized for detection and/or diagnosis of SARS-CoV-2 by FDA under an Emergency Use Authorization (EUA). This EUA will remain in effect (meaning this test can be used) for the duration of the COVID-19 declaration under Section 564(b)(1) of the Act, 21 U.S.C. section 360bbb-3(b)(1), unless the authorization is terminated or revoked.  Performed at [facility], [place] [address] [place], [place] [address]   Blood culture (routine x 2)     Status: None (Preliminary result)   Collection Time: 05/30/21  8:20 AM   Specimen: BLOOD RIGHT HAND  Result Value Ref Range Status   Specimen Description BLOOD RIGHT HAND  Final   Special Requests   Final    BOTTLES DRAWN AEROBIC AND ANAEROBIC Blood Culture results may not be optimal due to an inadequate volume of blood received in culture bottles   Culture   Final    NO GROWTH 2 DAYS Performed at [facility], [place] [address] [place], [place] [address]    Report Status PENDING  Incomplete  Blood culture (routine x 2)     Status: None (Preliminary result)   Collection Time: 05/30/21  8:22 AM   Specimen: BLOOD  Result Value Ref Range Status   Specimen Description BLOOD LEFT ANTECUBITAL  Final   Special Requests   Final    BOTTLES DRAWN AEROBIC AND ANAEROBIC Blood Culture adequate volume   Culture   Final    NO GROWTH 2 DAYS Performed at [facility], [place] [address], [place], [place] [address]    Report Status PENDING  Incomplete  Expectorated Sputum Assessment w Gram Stain, Rflx to Resp Cult     Status: None   Collection Time: 05/31/21  6:51 AM   Specimen: Expectorated Sputum  Result Value Ref Range Status   Specimen Description EXPECTORATED SPUTUM  Final   Special Requests NONE  Final   Sputum evaluation   Final     Sputum specimen not acceptable for testing.  Please recollect.   RESULT CALLED TO, READ BACK BY AND VERIFIED WITH: RN Reinaldo Berber [id number] FCP Performed at [facility], [place] [address] [place], [place] [address]    Report Status 05/31/2021 FINAL  Final         Radiology Studies: No results found.      Scheduled Meds:  atorvastatin  40 mg Oral Daily   budesonide (PULMICORT) nebulizer solution  0.25 mg Nebulization BID   carvedilol  25 mg Oral BID WC   docusate sodium  100 mg Oral BID  empagliflozin  10 mg Oral Daily   furosemide  40 mg Intravenous BID   hydrALAZINE  100 mg Oral Q8H   [START ON 06/02/2021] isosorbide mononitrate  60 mg Oral Daily   nicotine  14 mg Transdermal Daily   potassium chloride  40 mEq Oral BID   rivaroxaban  20 mg Oral Daily   sodium chloride flush  3 mL Intravenous Q12H   spironolactone  25 mg Oral Daily   Continuous Infusions:  sodium chloride Stopped (05/31/21 2152)     LOS: 2 days    Time spent: 30 minutes    Barb Merino, MD [facility] Pager [phone]

## 2021-06-01 NOTE — Progress Notes (Signed)
   Asked to follow-up on BMET by Dr. Audie Box. Creatinine stable at 2.15 and K+ at 3.5. Recommended to dose IV Lasix '80mg'$ x1. Additional K+ supplementation already ordered and BMET pending for AM.   Signed, Erma Heritage, PA-C 06/01/2021, 2:08 PM Pager: [phone]

## 2021-06-01 NOTE — Plan of Care (Signed)
  Problem: Clinical Measurements: Goal: Ability to maintain clinical measurements within normal limits will improve Outcome: Progressing Goal: Will remain free from infection Outcome: Progressing   Problem: Education: Goal: Knowledge of General Education information will improve Description: Including pain rating scale, medication(s)/side effects and non-pharmacologic comfort measures Outcome: Adequate for Discharge   Problem: Health Behavior/Discharge Planning: Goal: Ability to manage health-related needs will improve Outcome: Adequate for Discharge   Problem: Clinical Measurements: Goal: Diagnostic test results will improve Outcome: Adequate for Discharge Goal: Respiratory complications will improve Outcome: Adequate for Discharge Goal: Cardiovascular complication will be avoided Outcome: Adequate for Discharge   Problem: Activity: Goal: Risk for activity intolerance will decrease Outcome: Adequate for Discharge   Problem: Nutrition: Goal: Adequate nutrition will be maintained Outcome: Adequate for Discharge   Problem: Coping: Goal: Level of anxiety will decrease Outcome: Adequate for Discharge   Problem: Elimination: Goal: Will not experience complications related to bowel motility Outcome: Adequate for Discharge Goal: Will not experience complications related to urinary retention Outcome: Adequate for Discharge   Problem: Pain Managment: Goal: General experience of comfort will improve Outcome: Adequate for Discharge   Problem: Safety: Goal: Ability to remain free from injury will improve Outcome: Adequate for Discharge   Problem: Skin Integrity: Goal: Risk for impaired skin integrity will decrease Outcome: Adequate for Discharge

## 2021-06-01 NOTE — Progress Notes (Signed)
Cardiology Progress Note  Patient ID: Gerald [name] MRN: [id number] DOB: 1980-09-18 Date of Encounter: 06/01/2021  Primary Cardiologist: Ida Rogue, MD  Subjective   Chief Complaint: None.   HPI: Potassium severely low yesterday.  Has not been rechecked.  Holding further diuresis until this has been rechecked.  Reports his breathing is still not back to baseline.  Approaching euvolemia in my opinion.  ROS:  All other ROS reviewed and negative. Pertinent positives noted in the HPI.     Inpatient Medications  Scheduled Meds:  atorvastatin  40 mg Oral Daily   budesonide (PULMICORT) nebulizer solution  0.25 mg Nebulization BID   carvedilol  12.5 mg Oral BID WC   docusate sodium  100 mg Oral BID   hydrALAZINE  100 mg Oral Q8H   irbesartan  37.5 mg Oral Daily   isosorbide mononitrate  30 mg Oral Daily   nicotine  14 mg Transdermal Daily   potassium chloride  40 mEq Oral BID   rivaroxaban  20 mg Oral Daily   sodium chloride flush  3 mL Intravenous Q12H   spironolactone  25 mg Oral Daily   Continuous Infusions:  sodium chloride Stopped (05/31/21 2152)   azithromycin Stopped (05/31/21 2101)   cefTRIAXone (ROCEPHIN)  IV 2 g (06/01/21 0833)   PRN Meds: sodium chloride, acetaminophen **OR** acetaminophen, bisacodyl, guaiFENesin-codeine, hydrALAZINE, melatonin, morphine injection, ondansetron **OR** ondansetron (ZOFRAN) IV, oxyCODONE, polyethylene glycol   Vital Signs   Vitals:   06/01/21 0356 06/01/21 0500 06/01/21 0757 06/01/21 0817  BP: (!) 149/99   (!) 145/81  Pulse: 87   71  Resp: 18   20  Temp: 98.7 F (37.1 C)   98.6 F (37 C)  TempSrc: Oral   Oral  SpO2: 95%  96% 95%  Weight:  132 kg    Height:        Intake/Output Summary (Last 24 hours) at 06/01/2021 0854 Last data filed at 06/01/2021 0528 Gross per 24 hour  Intake 1411.95 ml  Output 2436 ml  Net -1024.05 ml   Last 3 Weights 06/01/2021 05/31/2021 05/30/2021  Weight (lbs) 291 lb 0.1 oz 294 lb 3.2 oz  293 lb 1.6 oz  Weight (kg) 132 kg 133.448 kg 132.949 kg      Telemetry  Overnight telemetry shows sinus rhythm in the 90s, which I personally reviewed.   ECG  The most recent ECG shows sinus tachycardia, left bundle branch block, QRS 184 ms, which I personally reviewed.   Physical Exam   Vitals:   06/01/21 0356 06/01/21 0500 06/01/21 0757 06/01/21 0817  BP: (!) 149/99   (!) 145/81  Pulse: 87   71  Resp: 18   20  Temp: 98.7 F (37.1 C)   98.6 F (37 C)  TempSrc: Oral   Oral  SpO2: 95%  96% 95%  Weight:  132 kg    Height:        Intake/Output Summary (Last 24 hours) at 06/01/2021 0854 Last data filed at 06/01/2021 0528 Gross per 24 hour  Intake 1411.95 ml  Output 2436 ml  Net -1024.05 ml    Last 3 Weights 06/01/2021 05/31/2021 05/30/2021  Weight (lbs) 291 lb 0.1 oz 294 lb 3.2 oz 293 lb 1.6 oz  Weight (kg) 132 kg 133.448 kg 132.949 kg    Body mass index is 40.59 kg/m.   General: Well nourished, well developed, in no acute distress Head: Atraumatic, normal size  Eyes: PEERLA, EOMI  Neck: Supple, JVD 8 to  10 cm of water Endocrine: No thryomegaly Cardiac: Normal S1, S2; RRR; no murmurs, rubs, or gallops Lungs: Crackles at the lung bases Abd: Soft, nontender, no hepatomegaly  Ext: No edema, pulses 2+ Musculoskeletal: No deformities, BUE and BLE strength normal and equal Skin: Warm and dry, no rashes   Neuro: Alert and oriented to person, place, time, and situation, CNII-XII grossly intact, no focal deficits  Psych: Normal mood and affect   Labs  High Sensitivity Troponin:   Recent Labs  Lab 05/30/21 0648 05/30/21 0822  TROPONINIHS 780* 799*     Cardiac EnzymesNo results for input(s): TROPONINI in the last 168 hours. No results for input(s): TROPIPOC in the last 168 hours.  Chemistry Recent Labs  Lab 05/30/21 0648 05/31/21 0412  NA 137 136  K 3.0* 2.7*  CL 105 103  CO2 20* 22  GLUCOSE 116* 120*  BUN 26* 23*  CREATININE 2.15* 2.16*  CALCIUM 9.1 8.4*   PROT 7.3  --   ALBUMIN 3.6  --   AST 30  --   ALT 34  --   ALKPHOS 82  --   BILITOT 1.5*  --   GFRNONAA 39* 39*  ANIONGAP 12 11    Hematology Recent Labs  Lab 05/30/21 0648 05/31/21 0412  WBC 9.1 11.3*  RBC 5.58 5.08  HGB 11.9* 11.0*  HCT 39.1 34.3*  MCV 70.1* 67.5*  MCH 21.3* 21.7*  MCHC 30.4 32.1  RDW 18.2* 17.3*  PLT 222 219   BNP Recent Labs  Lab 05/30/21 0648  BNP >4,500.0*    DDimer No results for input(s): DDIMER in the last 168 hours.   Radiology  No results found.  Cardiac Studies  TTE 01/20/2021   1. Left ventricular ejection fraction, by estimation, is 30 to 35%. The  left ventricle has moderately decreased function. The left ventricle  demonstrates global hypokinesis. The left ventricular internal cavity size  was mildly dilated. There is severe  left ventricular hypertrophy. Left ventricular diastolic parameters are  indeterminate.   2. Right ventricular systolic function is normal. The right ventricular  size is normal. Moderately increased right ventricular wall thickness.  Tricuspid regurgitation signal is inadequate for assessing PA pressure.   3. Left atrial size was severely dilated.   4. Right atrial size was mildly dilated.   5. The mitral valve is normal in structure. Trivial mitral valve  regurgitation.   6. The aortic valve is tricuspid. Aortic valve regurgitation is trivial.  No aortic stenosis is present.   7. The inferior vena cava is dilated in size with >50% respiratory  variability, suggesting right atrial pressure of 8 mmHg.   LHC [id number] LV end diastolic pressure is moderately elevated.   1. Very large and normal coronary arteries 2. Moderately elevated LVEDP 24 mm Hg  Patient Profile  Gerald [name] is a 40 y.o. male with history of atrial flutter status post TEE/cardioversion, polysubstance abuse including cocaine, obesity, CKD stage IIIa, systolic heart failure who was admitted on 05/30/2021 with acute on chronic  systolic heart failure.  Assessment & Plan   #Acute on chronic systolic heart failure, EF 30-35% #Nonischemic cardiomyopathy #Hypertension #Cocaine abuse #Left bundle branch block, QRS 184 ms -He has a well-known nonischemic cardiomyopathy.  Cardiac cath earlier this year showed normal coronary arteries. -Suspect his low EF is related to cocaine abuse uncontrolled hypertension and likely ventricular dyssynchrony in the setting of a left bundle branch block with wide QRS. -Cocaine positive on admission.  This  is problematic for aggressive care. -Kidney function shows CKD stage IIIa. -He is approaching euvolemia.  Net -3 L since admission.  Potassium severely low yesterday.  We will hold further diuresis until we have this value.  I would like to give him 1 more day of likely IV diuretic therapy.  We will repeat an echocardiogram.  He is obese so hemodynamics are a bit difficult to assess.  I suspect he is euvolemic but echo will help confirm this. -Continue Coreg.  I will increase this to 25 mg twice daily.  He cannot use cocaine.  Counseled on this.  He is on Aldactone 25 mg daily.  We have him on hydralazine 100 mg 3 times daily.  I will increase his Imdur to 60 mg daily.  Kidney function appears to be stable.  Likely will add back Entresto as long as kidney function is stable.  Given his young age I think he merits an attempt at [facility].  We will be cautious of his kidney function. -Suspect he will be able to transition to oral diuretics tomorrow. -He really should be considered for BiV ICD however given his cocaine use he is not a candidate for pacemaker therapy.  I do wonder how much his left bundle branch block is contributing to his cardiomyopathy.  Clearly cocaine and uncontrolled hypertension are contributing more.  #AFL s/p TEE/DCCV -Maintaining sinus rhythm -On Xarelto -Continue Coreg  For questions or updates, please contact [facility] Please consult [email or web address] for contact info  under   Time Spent with Patient: I have spent a total of 35 minutes with patient reviewing hospital notes, telemetry, EKGs, labs and examining the patient as well as establishing an assessment and plan that was discussed with the patient.  > 50% of time was spent in direct patient care.    Signed, Addison Naegeli. Audie Box, MD, [facility]  06/01/2021 8:54 AM

## 2021-06-01 NOTE — Plan of Care (Signed)
  Problem: Clinical Measurements: Goal: Ability to maintain clinical measurements within normal limits will improve Outcome: Progressing Goal: Will remain free from infection Outcome: Progressing Goal: Diagnostic test results will improve Outcome: Progressing Goal: Respiratory complications will improve Outcome: Progressing Goal: Cardiovascular complication will be avoided Outcome: Progressing   Problem: Activity: Goal: Risk for activity intolerance will decrease Outcome: Progressing   Problem: Nutrition: Goal: Adequate nutrition will be maintained Outcome: Progressing   Problem: Elimination: Goal: Will not experience complications related to bowel motility Outcome: Progressing Goal: Will not experience complications related to urinary retention Outcome: Progressing   Problem: Pain Managment: Goal: General experience of comfort will improve Outcome: Progressing   Problem: Safety: Goal: Ability to remain free from injury will improve Outcome: Progressing

## 2021-06-02 LAB — BASIC METABOLIC PANEL
Anion gap: 12 (ref 5–15)
BUN: 31 mg/dL — ABNORMAL HIGH (ref 6–20)
CO2: 21 mmol/L — ABNORMAL LOW (ref 22–32)
Calcium: 8.9 mg/dL (ref 8.9–10.3)
Chloride: 102 mmol/L (ref 98–111)
Creatinine, Ser: 2.01 mg/dL — ABNORMAL HIGH (ref 0.61–1.24)
GFR, Estimated: 42 mL/min — ABNORMAL LOW (ref >60)
Glucose, Bld: 103 mg/dL — ABNORMAL HIGH (ref 70–99)
Potassium: 3.6 mmol/L (ref 3.5–5.1)
Sodium: 135 mmol/L (ref 135–145)

## 2021-06-02 MED ORDER — SACUBITRIL-VALSARTAN 49-51 MG PO TABS
1.0000 | ORAL_TABLET | Freq: Two times a day (BID) | ORAL | Status: DC
  Administered 2021-06-02 – 2021-06-03 (×3): 1 via ORAL
  Filled 2021-06-02 (×3): qty 1

## 2021-06-02 MED ORDER — TORSEMIDE 20 MG PO TABS
20.0000 mg | ORAL_TABLET | Freq: Every day | ORAL | Status: DC
  Administered 2021-06-02 – 2021-06-03 (×2): 20 mg via ORAL
  Filled 2021-06-02 (×2): qty 1

## 2021-06-02 NOTE — Progress Notes (Signed)
PROGRESS NOTE    Gerald [name]  [id number] DOB: 12/23/1980 DOA: 05/30/2021 PCP: Patient, [name] (Inactive)    Brief Narrative:  40 year old chronic combined heart failure, CKD stage IIIb, A. fib status post cardioversion on Xarelto, hypertension, polysubstance abuse and morbid obesity presented with hemoptysis for 1 day.  Ran out of medication for about 2 weeks.  Gained 20 pounds of weight in 1 week.  In the emergency room found to be fluid overloaded, BNP markedly elevated, wide-complex concerning for VT/started on amiodarone.  Also given antibiotics.   Assessment & Plan:   Principal Problem:   Acute on chronic combined systolic and diastolic CHF (congestive heart failure) ([name]) Active Problems:   Essential hypertension   PAF (paroxysmal atrial fibrillation) ([name])   CKD (chronic kidney disease), stage III ([name])   Polysubstance abuse ([name])   Obesity, Class III, BMI 40-49.9 (morbid obesity) ([facility])  Acute on chronic combined systolic and diastolic congestive heart failure: Related to medication noncompliance and smoking and snorting cocaine. Patient was treated with IV diuresis with good response. Remains on hydralazine, nitrates and carvedilol.  He is also on Aldactone and Entresto. Clinically improving, I will await cardiology recommendations to convert to oral medications today.  Hypertension: Severely hypertensive.  Blood pressures better on hydralazine and Imdur on increased doses.  Cocaine abuse: Counseled.  He wants to quit.  Hemoptysis: Due to pulmonary edema along with snorting cocaine.  Stable.  Improving.  Abnormal rhythm: Suspect a flutter with variable AV block or PACs:  Currently remains stable.  Patient is started on Xarelto.  Restarted on carvedilol.  CKD stage IIIb: At about baseline.  Much improved.  Creatinine 2.01 today.  Hypokalemia: Replaced aggressively.  Adequate. Magnesium is adequate.   Elevated troponins: Demand ischemia.  Start  mobilizing.  Will need medication assistance on discharge.   DVT prophylaxis: SCDs Start: 05/30/21 1109 rivaroxaban (XARELTO) tablet 20 mg   Code Status: Full code Family Communication: None Disposition Plan: Status is: Inpatient  Remains inpatient appropriate because: Severity of symptoms.  On IV diuresis.  Consultants:  Cardiology Pulmonary  Procedures:  None  Antimicrobials:  Rocephin azithromycin 10/13---10/15   Subjective: Patient seen and examined.  I asked him to walk around in the hallway and he did well.  After walking all around the unit he had some shortness of breath but no drop in oxygen.  Denies any chest pain or palpitations. He tells me he may need 1 more day in the hospital.  He tells me that he needs assistance and new prescriptions for all medications.  Objective: Vitals:   06/02/21 0605 06/02/21 0812 06/02/21 0948 06/02/21 0956  BP: (!) 150/102   136/73  Pulse: 89   81  Resp:      Temp:   98.8 F (37.1 C) 98.8 F (37.1 C)  TempSrc:   Oral Oral  SpO2: 98% 93%  99%  Weight: 130.2 kg     Height:        Intake/Output Summary (Last 24 hours) at 06/02/2021 1026 Last data filed at 06/02/2021 0900 Gross per 24 hour  Intake 1437 ml  Output 3850 ml  Net -2413 ml   Filed Weights   05/31/21 0439 06/01/21 0500 06/02/21 0605  Weight: 133.4 kg 132 kg 130.2 kg    Examination:  General exam: looks comfortable. Walking in the hallway . On room air.  Respiratory system: Bilateral clear. Difficult to auscultate. Cardiovascular system: S1 & S2 heard, RRR.  No peripheral edema.  Gastrointestinal system: Obese and pendulous.  Bowel sounds present. Central nervous system: Alert and oriented. No focal neurological deficits. Extremities: Symmetric 5 x 5 power. Skin: No rashes, lesions or ulcers Psychiatry: Judgement and insight appear normal. Mood & affect appropriate.     Data Reviewed: I have personally reviewed following labs and imaging  studies  CBC: Recent Labs  Lab 05/30/21 0648 05/31/21 0412  WBC 9.1 11.3*  NEUTROABS 6.8  --   HGB 11.9* 11.0*  HCT 39.1 34.3*  MCV 70.1* 67.5*  PLT 222 [id number]   Basic Metabolic Panel: Recent Labs  Lab 05/30/21 0648 05/30/21 0822 05/31/21 0412 06/01/21 0338 06/01/21 0736 06/02/21 0335  NA 137  --  136  --  138 135  K 3.0*  --  2.7*  --  3.5 3.6  CL 105  --  103  --  105 102  CO2 20*  --  22  --  22 21*  GLUCOSE 116*  --  120*  --  96 103*  BUN 26*  --  23*  --  26* 31*  CREATININE 2.15*  --  2.16*  --  2.15* 2.01*  CALCIUM 9.1  --  8.4*  --  8.4* 8.9  MG  --  2.1 1.8 2.0  --   --    GFR: Estimated Creatinine Clearance: 67.2 mL/min (A) (by C-G formula based on SCr of 2.01 mg/dL (H)). Liver Function Tests: Recent Labs  Lab 05/30/21 0648  AST 30  ALT 34  ALKPHOS 82  BILITOT 1.5*  PROT 7.3  ALBUMIN 3.6   No results for input(s): LIPASE, AMYLASE in the last 168 hours. No results for input(s): AMMONIA in the last 168 hours. Coagulation Profile: No results for input(s): INR, PROTIME in the last 168 hours. Cardiac Enzymes: No results for input(s): CKTOTAL, CKMB, CKMBINDEX, TROPONINI in the last 168 hours. BNP (last 3 results) No results for input(s): PROBNP in the last 8760 hours. HbA1C: No results for input(s): HGBA1C in the last 72 hours. CBG: No results for input(s): GLUCAP in the last 168 hours. Lipid Profile: No results for input(s): CHOL, HDL, LDLCALC, TRIG, CHOLHDL, LDLDIRECT in the last 72 hours. Thyroid Function Tests: No results for input(s): TSH, T4TOTAL, FREET4, T3FREE, THYROIDAB in the last 72 hours. Anemia Panel: No results for input(s): VITAMINB12, FOLATE, FERRITIN, TIBC, IRON, RETICCTPCT in the last 72 hours. Sepsis Labs: Recent Labs  Lab 05/30/21 0803 05/30/21 1016  LATICACIDVEN 1.8 1.4    Recent Results (from the past 240 hour(s))  Resp Panel by RT-PCR (Flu A&B, Covid) Nasopharyngeal Swab     Status: None   Collection Time: 05/30/21   6:48 AM   Specimen: Nasopharyngeal Swab; Nasopharyngeal(NP) swabs in vial transport medium  Result Value Ref Range Status   SARS Coronavirus 2 by RT PCR NEGATIVE NEGATIVE Final    Comment: (NOTE) SARS-CoV-2 target nucleic acids are NOT DETECTED.  The SARS-CoV-2 RNA is generally detectable in upper respiratory specimens during the acute phase of infection. The lowest concentration of SARS-CoV-2 viral copies this assay can detect is 138 copies/mL. A negative result does not preclude SARS-Cov-2 infection and should not be used as the sole basis for treatment or other patient management decisions. A negative result may occur with  improper specimen collection/handling, submission of specimen other than nasopharyngeal swab, presence of viral mutation(s) within the areas targeted by this assay, and inadequate number of viral copies(<138 copies/mL). A negative result must be combined with clinical observations, patient history, and epidemiological  information. The expected result is Negative.  Fact Sheet for Patients:  EntrepreneurPulse.com.au  Fact Sheet for Healthcare Providers:  IncredibleEmployment.be  This test is no t yet approved or cleared by the [place] FDA and  has been authorized for detection and/or diagnosis of SARS-CoV-2 by FDA under an Emergency Use Authorization (EUA). This EUA will remain  in effect (meaning this test can be used) for the duration of the COVID-19 declaration under Section 564(b)(1) of the Act, 21 U.S.C.section 360bbb-3(b)(1), unless the authorization is terminated  or revoked sooner.       Influenza A by PCR NEGATIVE NEGATIVE Final   Influenza B by PCR NEGATIVE NEGATIVE Final    Comment: (NOTE) The Xpert Xpress SARS-CoV-2/FLU/RSV plus assay is intended as an aid in the diagnosis of influenza from Nasopharyngeal swab specimens and should not be used as a sole basis for treatment. Nasal washings and aspirates  are unacceptable for Xpert Xpress SARS-CoV-2/FLU/RSV testing.  Fact Sheet for Patients: EntrepreneurPulse.com.au  Fact Sheet for Healthcare Providers: IncredibleEmployment.be  This test is not yet approved or cleared by the [place] FDA and has been authorized for detection and/or diagnosis of SARS-CoV-2 by FDA under an Emergency Use Authorization (EUA). This EUA will remain in effect (meaning this test can be used) for the duration of the COVID-19 declaration under Section 564(b)(1) of the Act, 21 U.S.C. section 360bbb-3(b)(1), unless the authorization is terminated or revoked.  Performed at [facility], [place] [address] [place], [place] [address]   Blood culture (routine x 2)     Status: None (Preliminary result)   Collection Time: 05/30/21  8:20 AM   Specimen: BLOOD RIGHT HAND  Result Value Ref Range Status   Specimen Description BLOOD RIGHT HAND  Final   Special Requests   Final    BOTTLES DRAWN AEROBIC AND ANAEROBIC Blood Culture results may not be optimal due to an inadequate volume of blood received in culture bottles   Culture   Final    NO GROWTH 3 DAYS Performed at [facility], [place] [address], [place], [place] [address]    Report Status PENDING  Incomplete  Blood culture (routine x 2)     Status: None (Preliminary result)   Collection Time: 05/30/21  8:22 AM   Specimen: BLOOD  Result Value Ref Range Status   Specimen Description BLOOD LEFT ANTECUBITAL  Final   Special Requests   Final    BOTTLES DRAWN AEROBIC AND ANAEROBIC Blood Culture adequate volume   Culture   Final    NO GROWTH 3 DAYS Performed at [facility], [place] [address], [place], [place] [address]    Report Status PENDING  Incomplete  Expectorated Sputum Assessment w Gram Stain, Rflx to Resp Cult     Status: None   Collection Time: 05/31/21  6:51 AM   Specimen: Expectorated Sputum  Result Value Ref Range Status   Specimen Description  EXPECTORATED SPUTUM  Final   Special Requests NONE  Final   Sputum evaluation   Final    Sputum specimen not acceptable for testing.  Please recollect.   RESULT CALLED TO, READ BACK BY AND VERIFIED WITH: RN Reinaldo Berber [id number] FCP Performed at [facility], [place] [address] [place],  [address]    Report Status 05/31/2021 FINAL  Final         Radiology Studies: No results found.      Scheduled Meds:  atorvastatin  40 mg Oral Daily  budesonide (PULMICORT) nebulizer solution  0.25 mg Nebulization BID   carvedilol  25 mg Oral BID WC   docusate sodium  100 mg Oral BID   empagliflozin  10 mg Oral Daily   hydrALAZINE  100 mg Oral Q8H   isosorbide mononitrate  60 mg Oral Daily   nicotine  14 mg Transdermal Daily   rivaroxaban  20 mg Oral Daily   sacubitril-valsartan  1 tablet Oral BID   sodium chloride flush  3 mL Intravenous Q12H   spironolactone  25 mg Oral Daily   torsemide  20 mg Oral Daily   Continuous Infusions:  sodium chloride Stopped (05/31/21 2152)     LOS: 3 days    Time spent: 30 minutes    Barb Merino, MD [facility] Pager [phone]

## 2021-06-02 NOTE — Progress Notes (Signed)
Cardiology Progress Note  Patient ID: Gerald [name] MRN: [id number] DOB: [birth year] Date of Encounter: 06/02/2021  Primary Cardiologist: Ida Rogue, MD  Subjective   Chief Complaint: none.   HPI: Good diuresis.  Shortness of breath still not back to baseline but improving.  ROS:  All other ROS reviewed and negative. Pertinent positives noted in the HPI.     Inpatient Medications  Scheduled Meds:  atorvastatin  40 mg Oral Daily   budesonide (PULMICORT) nebulizer solution  0.25 mg Nebulization BID   carvedilol  25 mg Oral BID WC   docusate sodium  100 mg Oral BID   empagliflozin  10 mg Oral Daily   hydrALAZINE  100 mg Oral Q8H   isosorbide mononitrate  60 mg Oral Daily   nicotine  14 mg Transdermal Daily   rivaroxaban  20 mg Oral Daily   sacubitril-valsartan  1 tablet Oral BID   sodium chloride flush  3 mL Intravenous Q12H   spironolactone  25 mg Oral Daily   torsemide  20 mg Oral Daily   Continuous Infusions:  sodium chloride Stopped (05/31/21 2152)   PRN Meds: sodium chloride, acetaminophen **OR** acetaminophen, bisacodyl, guaiFENesin-codeine, hydrALAZINE, melatonin, morphine injection, ondansetron **OR** ondansetron (ZOFRAN) IV, oxyCODONE, polyethylene glycol   Vital Signs   Vitals:   06/02/21 0605 06/02/21 0812 06/02/21 0948 06/02/21 0956  BP: (!) 150/102   136/73  Pulse: 89   81  Resp:      Temp:   98.8 F (37.1 C) 98.8 F (37.1 C)  TempSrc:   Oral Oral  SpO2: 98% 93%  99%  Weight: 130.2 kg     Height:        Intake/Output Summary (Last 24 hours) at 06/02/2021 1106 Last data filed at 06/02/2021 0900 Gross per 24 hour  Intake 1437 ml  Output 3850 ml  Net -2413 ml   Last 3 Weights 06/02/2021 06/01/2021 05/31/2021  Weight (lbs) 287 lb 0.6 oz 291 lb 0.1 oz 294 lb 3.2 oz  Weight (kg) 130.2 kg 132 kg 133.448 kg      Telemetry  Overnight telemetry shows sinus rhythm in the 80s, which I personally reviewed.   Physical Exam   Vitals:    06/02/21 0605 06/02/21 0812 06/02/21 0948 06/02/21 0956  BP: (!) 150/102   136/73  Pulse: 89   81  Resp:      Temp:   98.8 F (37.1 C) 98.8 F (37.1 C)  TempSrc:   Oral Oral  SpO2: 98% 93%  99%  Weight: 130.2 kg     Height:        Intake/Output Summary (Last 24 hours) at 06/02/2021 1106 Last data filed at 06/02/2021 0900 Gross per 24 hour  Intake 1437 ml  Output 3850 ml  Net -2413 ml    Last 3 Weights 06/02/2021 06/01/2021 05/31/2021  Weight (lbs) 287 lb 0.6 oz 291 lb 0.1 oz 294 lb 3.2 oz  Weight (kg) 130.2 kg 132 kg 133.448 kg    Body mass index is 40.03 kg/m.   General: Well nourished, well developed, in no acute distress Head: Atraumatic, normal size  Eyes: PEERLA, EOMI  Neck: Supple, no JVD Endocrine: No thryomegaly Cardiac: Normal S1, S2; RRR; no murmurs, rubs, or gallops Lungs: Clear to auscultation bilaterally, no wheezing, rhonchi or rales  Abd: Soft, nontender, no hepatomegaly  Ext: No edema, pulses 2+ Musculoskeletal: No deformities, BUE and BLE strength normal and equal Skin: Warm and dry, no rashes   Neuro: Alert  and oriented to person, place, time, and situation, CNII-XII grossly intact, no focal deficits  Psych: Normal mood and affect   Labs  High Sensitivity Troponin:   Recent Labs  Lab 05/30/21 0648 05/30/21 0822  TROPONINIHS 780* 799*     Cardiac EnzymesNo results for input(s): TROPONINI in the last 168 hours. No results for input(s): TROPIPOC in the last 168 hours.  Chemistry Recent Labs  Lab 05/30/21 0648 05/31/21 0412 06/01/21 0736 06/02/21 0335  NA 137 136 138 135  K 3.0* 2.7* 3.5 3.6  CL 105 103 105 102  CO2 20* 22 22 21*  GLUCOSE 116* 120* 96 103*  BUN 26* 23* 26* 31*  CREATININE 2.15* 2.16* 2.15* 2.01*  CALCIUM 9.1 8.4* 8.4* 8.9  PROT 7.3  --   --   --   ALBUMIN 3.6  --   --   --   AST 30  --   --   --   ALT 34  --   --   --   ALKPHOS 82  --   --   --   BILITOT 1.5*  --   --   --   GFRNONAA 39* 39* 39* 42*  ANIONGAP '12 11  11 12    '$ Hematology Recent Labs  Lab 05/30/21 0648 05/31/21 0412  WBC 9.1 11.3*  RBC 5.58 5.08  HGB 11.9* 11.0*  HCT 39.1 34.3*  MCV 70.1* 67.5*  MCH 21.3* 21.7*  MCHC 30.4 32.1  RDW 18.2* 17.3*  PLT 222 219   BNP Recent Labs  Lab 05/30/21 0648  BNP >4,500.0*    DDimer No results for input(s): DDIMER in the last 168 hours.   Radiology  No results found.  Cardiac Studies  TTE 01/20/2021  1. Left ventricular ejection fraction, by estimation, is 30 to 35%. The  left ventricle has moderately decreased function. The left ventricle  demonstrates global hypokinesis. The left ventricular internal cavity size  was mildly dilated. There is severe  left ventricular hypertrophy. Left ventricular diastolic parameters are  indeterminate.   2. Right ventricular systolic function is normal. The right ventricular  size is normal. Moderately increased right ventricular wall thickness.  Tricuspid regurgitation signal is inadequate for assessing PA pressure.   3. Left atrial size was severely dilated.   4. Right atrial size was mildly dilated.   5. The mitral valve is normal in structure. Trivial mitral valve  regurgitation.   6. The aortic valve is tricuspid. Aortic valve regurgitation is trivial.  No aortic stenosis is present.   7. The inferior vena cava is dilated in size with >50% respiratory  variability, suggesting right atrial pressure of 8 mmHg.   Patient Profile  Gerald [name] is a 40 y.o. male with history of atrial flutter status post TEE/cardioversion, polysubstance abuse including cocaine, obesity, CKD stage IIIa, systolic heart failure who was admitted on 05/30/2021 with acute on chronic systolic heart failure.  Assessment & Plan   #Acute on chronic systolic heart failure, EF 30-35% #Nonischemic cardiomyopathy #Hypertension #Cocaine abuse #Left bundle branch block, QRS 184 ms -Well known nonischemic cardiomyopathy.  Normal left heart cath earlier this year.   Suspect EF is being driven by cocaine abuse uncontrolled hypertension and left bundle branch block with wide QRS. -He is effectively euvolemic.  Transition to torsemide 20 mg daily today. -Repeat echocardiogram pending. -Blood pressure much improved.  Continue Coreg 25 mg twice daily, Aldactone 25 mg daily, hydralazine 100 mg 3 times daily, Imdur 60 mg  daily.  I have added back Entresto 49-51 mg twice daily.  He is tolerating this well. -We will have to be cautious of his kidney function.  Things are stable. -Anticipate discharge tomorrow with stability in kidney function. -Really needs to quit using cocaine.  He will try to do this. -I do wonder if his EF will ever recover.  He does have a wide QRS with a left bundle branch block.  Not a candidate for BiV ICD currently.  Hopefully he can follow-up and prove himself to be clean from drugs.  #History of atrial flutter -History of T/cardioversion.  Maintaining sinus rhythm. -Continue Xarelto.  For questions or updates, please contact [place] Please consult [email or web address] for contact info under   Time Spent with Patient: I have spent a total of 25 minutes with patient reviewing hospital notes, telemetry, EKGs, labs and examining the patient as well as establishing an assessment and plan that was discussed with the patient.  > 50% of time was spent in direct patient care.    Signed, Addison Naegeli. Audie Box, MD, [place]  06/02/2021 11:06 AM

## 2021-06-02 NOTE — Plan of Care (Signed)
  Problem: Education: Goal: Knowledge of General Education information will improve Description: Including pain rating scale, medication(s)/side effects and non-pharmacologic comfort measures Outcome: Progressing   Problem: Clinical Measurements: Goal: Will remain free from infection Outcome: Progressing   Problem: Clinical Measurements: Goal: Cardiovascular complication will be avoided Outcome: Progressing   Problem: Activity: Goal: Risk for activity intolerance will decrease Outcome: Progressing

## 2021-06-03 ENCOUNTER — Other Ambulatory Visit (HOSPITAL_COMMUNITY): Payer: Self-pay

## 2021-06-03 DIAGNOSIS — F1410 Cocaine abuse, uncomplicated: Secondary | ICD-10-CM

## 2021-06-03 LAB — BASIC METABOLIC PANEL
Anion gap: 12 (ref 5–15)
BUN: 33 mg/dL — ABNORMAL HIGH (ref 6–20)
CO2: 23 mmol/L (ref 22–32)
Calcium: 9.5 mg/dL (ref 8.9–10.3)
Chloride: 102 mmol/L (ref 98–111)
Creatinine, Ser: 2.11 mg/dL — ABNORMAL HIGH (ref 0.61–1.24)
GFR, Estimated: 40 mL/min — ABNORMAL LOW (ref >60)
Glucose, Bld: 107 mg/dL — ABNORMAL HIGH (ref 70–99)
Potassium: 3.2 mmol/L — ABNORMAL LOW (ref 3.5–5.1)
Sodium: 137 mmol/L (ref 135–145)

## 2021-06-03 MED ORDER — EMPAGLIFLOZIN 10 MG PO TABS
10.0000 mg | ORAL_TABLET | Freq: Every day | ORAL | 2 refills | Status: DC
  Filled 2021-06-03: qty 30, 30d supply, fill #0

## 2021-06-03 MED ORDER — DAPAGLIFLOZIN PROPANEDIOL 10 MG PO TABS
10.0000 mg | ORAL_TABLET | Freq: Every day | ORAL | 0 refills | Status: DC
  Filled 2021-06-03: qty 30, 30d supply, fill #0

## 2021-06-03 MED ORDER — ISOSORBIDE MONONITRATE ER 60 MG PO TB24
60.0000 mg | ORAL_TABLET | Freq: Every day | ORAL | 0 refills | Status: DC
  Filled 2021-06-03: qty 30, 30d supply, fill #0

## 2021-06-03 MED ORDER — CARVEDILOL 25 MG PO TABS
25.0000 mg | ORAL_TABLET | Freq: Two times a day (BID) | ORAL | 0 refills | Status: DC
  Filled 2021-06-03: qty 60, 30d supply, fill #0

## 2021-06-03 MED ORDER — HYDRALAZINE HCL 100 MG PO TABS
100.0000 mg | ORAL_TABLET | Freq: Three times a day (TID) | ORAL | 0 refills | Status: DC
  Filled 2021-06-03: qty 90, 30d supply, fill #0

## 2021-06-03 MED ORDER — POTASSIUM CHLORIDE CRYS ER 20 MEQ PO TBCR
40.0000 meq | EXTENDED_RELEASE_TABLET | ORAL | Status: AC
  Administered 2021-06-03 (×2): 40 meq via ORAL
  Filled 2021-06-03 (×2): qty 2

## 2021-06-03 MED ORDER — ATORVASTATIN CALCIUM 40 MG PO TABS
40.0000 mg | ORAL_TABLET | Freq: Every day | ORAL | 0 refills | Status: DC
  Filled 2021-06-03: qty 30, 30d supply, fill #0

## 2021-06-03 MED ORDER — SACUBITRIL-VALSARTAN 49-51 MG PO TABS
1.0000 | ORAL_TABLET | Freq: Two times a day (BID) | ORAL | 0 refills | Status: AC
  Filled 2021-06-03: qty 60, 30d supply, fill #0

## 2021-06-03 MED ORDER — TORSEMIDE 20 MG PO TABS
20.0000 mg | ORAL_TABLET | Freq: Every day | ORAL | 0 refills | Status: DC
  Filled 2021-06-03: qty 30, 30d supply, fill #0

## 2021-06-03 NOTE — TOC CAGE-AID Note (Signed)
Transition of Care [facility]) - CAGE-AID Screening   Patient Details  Name: Gerald [name] MRN: [id number] Date of Birth: March 27, 1981  Transition of [facility]) CM/SW Contact:    Zenon Mayo, RN Phone Number: 06/03/2021, 2:13 PM   Clinical Narrative: NCM gave patient SA resources. He gladly received them.   CAGE-AID Screening:    Have You Ever Felt You Ought to Cut Down on Your Drinking or Drug Use?: Yes Have People Annoyed You By Critizing Your Drinking Or Drug Use?: No Have You Felt Bad Or Guilty About Your Drinking Or Drug Use?: Yes Have You Ever Had a Drink or Used Drugs First Thing In The Morning to Steady Your Nerves or to Get Rid of a Hangover?: No CAGE-AID Score: 2  Substance Abuse Education Offered: Yes  Substance abuse interventions: Scientist, clinical (histocompatibility and immunogenetics)

## 2021-06-03 NOTE — Progress Notes (Signed)
Discharge instructions (including medications) discussed with and copy provided to patient/caregiver 

## 2021-06-03 NOTE — TOC Transition Note (Addendum)
Transition of Care [facility]) - CM/SW Discharge Note   Patient Details  Name: Gerald [name] MRN: [id number] Date of Birth: 01/25/1981  Transition of Care [facility]) CM/SW Contact:  Zenon Mayo, RN Phone Number: 06/03/2021, 2:08 PM   Clinical Narrative:    NCM spoke with patient he states he lives in [place],  NCM called left message for Mr. Gerald [name] that patient is for dc today.  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   Final next level of care: Group Home Barriers to Discharge: No Barriers Identified   Patient Goals and CMS Choice Patient states their goals for this hospitalization and ongoing recovery are:: return to group home   Choice offered to / list presented to : NA  Discharge Placement                       Discharge Plan and Services   Discharge Planning Services: CM Consult Post Acute Care Choice: NA            DME Agency: NA       HH Arranged: NA          Social Determinants of Health (SDOH) Interventions     Readmission Risk Interventions Readmission Risk Prevention Plan 06/03/2021 04/02/2021  Transportation Screening Complete Complete  Medication Review Press photographer) Complete Complete  PCP or Specialist appointment within 3-5 days of discharge Complete Complete  HRI or [place] Complete Complete  SW Recovery Care/Counseling Consult Complete Complete  Palliative Care Screening Not Applicable Not [place] Not Applicable Not Applicable  Some recent data might be hidden

## 2021-06-03 NOTE — Plan of Care (Signed)
  Problem: Education: Goal: Knowledge of General Education information will improve Description: Including pain rating scale, medication(s)/side effects and non-pharmacologic comfort measures Outcome: Adequate for Discharge   

## 2021-06-03 NOTE — Progress Notes (Signed)
   HF Navigation Team   Gerald [name] meets criteria for HF [facility] clinic.   Appointment made for Monday 10/24 at 1000  Provided with Living with Heart Failure [place].    Markiyah Gahm NP-C  3:23 PM

## 2021-06-03 NOTE — Consult Note (Signed)
   [facility] CM Inpatient Consult   06/03/2021  Gerald [name] 02-15-1981 [id number]  Showing extreme high risk with a [employer] banner:  no insurance coverage at this time  The patient is not on the current member enrollment rosters for any of the [place] risk contracted plans.    Natividad Brood, RN BSN [facility] Liaison  [phone] business mobile phone Toll free office [phone]  Fax number: [phone] [email or web address]

## 2021-06-03 NOTE — Discharge Summary (Signed)
Physician Discharge Summary  Gerald [name] [id number] DOB: November 21, 1980 DOA: 05/30/2021  PCP: Patient, [name] (Inactive)  Admit date: 05/30/2021 Discharge date: 06/03/2021  Admitted From: Home Disposition: Home  Recommendations for Outpatient Follow-up:  Follow up with PCP as a scheduled. Please obtain BMP/CBC in one week Cardiology office will schedule follow-up.  Home Health: N/A Equipment/Devices: N/A  Discharge Condition: Stable CODE STATUS: Full code Diet recommendation: Low-salt diet, fluid restriction 1500 mL/day.  Discharge summary: 40 year old nonischemic cardiomyopathy, chronic combined heart failure, CKD stage IIIb, A. fib status post cardioversion on Xarelto, hypertension, polysubstance abuse and morbid obesity presented with hemoptysis for 1 day.  Ran out of medication for about 2 weeks.  Gained 20 pounds of weight in 1 week.  In the emergency room found to be fluid overloaded, BNP markedly elevated, wide-complex concerning for VT/started on amiodarone.  Also given antibiotics. Patient was admitted to the hospital due to significant symptoms.  Treated for following condition.    # Acute on chronic combined systolic and diastolic congestive heart failure.known history of nonischemic cardiomyopathy.  Related to medication noncompliance and smoking and snorting cocaine. Patient was treated with IV diuresis with good response. Remains on hydralazine, nitrates and carvedilol.  He is also on Aldactone and Entresto.  Clinically improving. As per cardiology recommendations, he is going home on Hydralazine, Imdur, carvedilol, Farxiga that changed to jardiance, Aldactone and increased dose of Entresto. Blood pressures are better controlled. Going back on Xarelto, aspirin is stopped. He does have stage IIIb chronic kidney disease and creatinine is at about baseline.  Will need close follow-up.  Extensive counseling about drug use, medication compliance and patient is  motivated.  Hypertension: Severely hypertensive on presentation.  Well controlled now.   Hemoptysis: Due to pulmonary edema along with snorting cocaine.  Stable.  Resolved.   Abnormal rhythm: Suspect a flutter with variable AV block or PACs:  Currently remains stable.  Patient started on Xarelto.  Restarted on carvedilol. Remains in rate controlled in sinus rhythm.  Patient adequately stabilized for discharge today.    Discharge Diagnoses:  Principal Problem:   Acute on chronic combined systolic and diastolic CHF (congestive heart failure) ([name]) Active Problems:   Essential hypertension   PAF (paroxysmal atrial fibrillation) ([name])   CKD (chronic kidney disease), stage III ([name])   Polysubstance abuse ([name])   Obesity, Class III, BMI 40-49.9 (morbid obesity) ([place])    Discharge Instructions  Discharge Instructions     Call MD for:  difficulty breathing, headache or visual disturbances   Complete by: As directed    Call MD for:  persistant dizziness or light-headedness   Complete by: As directed    Diet - low sodium heart healthy   Complete by: As directed    Fluid restriction 1500 ml per day   Increase activity slowly   Complete by: As directed       Allergies as of 06/03/2021   No Known Allergies      Medication List     STOP taking these medications    Aspirin Low Dose 81 MG EC tablet Generic drug: aspirin   Entresto 24-26 MG Generic drug: sacubitril-valsartan Replaced by: Entresto 49-51 MG   Farxiga 10 MG Tabs tablet Generic drug: dapagliflozin propanediol   furosemide 40 MG tablet Commonly known as: LASIX       TAKE these medications    atorvastatin 40 MG tablet Commonly known as: LIPITOR Take 1 tablet (40 mg total) by mouth daily.  carvedilol 25 MG tablet Commonly known as: COREG Take 1 tablet (25 mg total) by mouth 2 (two) times daily with a meal.   DIUREX PO Take 1 tablet by mouth daily as needed (bloating).   Entresto 49-51  MG Generic drug: sacubitril-valsartan Take 1 tablet by mouth 2 (two) times daily. Replaces: Entresto 24-26 MG   hydrALAZINE 100 MG tablet Commonly known as: APRESOLINE Take 1 tablet (100 mg total) by mouth every 8 (eight) hours.   isosorbide mononitrate 60 MG 24 hr tablet Commonly known as: IMDUR Take 1 tablet (60 mg total) by mouth daily. Start taking on: June 04, 2021 What changed:  medication strength how much to take   Jardiance 10 MG Tabs tablet Generic drug: empagliflozin Take 1 tablet (10 mg total) by mouth daily. Start taking on: June 04, 2021   potassium chloride 10 MEQ tablet Commonly known as: KLOR-CON Take 1 tablet (10 mEq total) by mouth daily. What changed: when to take this   spironolactone 25 MG tablet Commonly known as: ALDACTONE Take 1 tablet (25 mg total) by mouth daily for 30 doses.   torsemide 20 MG tablet Commonly known as: DEMADEX Take 1 tablet (20 mg total) by mouth daily. Start taking on: June 04, 2021   Xarelto 20 MG Tabs tablet Generic drug: rivaroxaban Take 1 tablet (20 mg total) by mouth daily with supper.        Follow-up Information     [facility] Go on 06/07/2021.   Why: '@1'$ :30pm Contact information: [place] [phone]        Minna Merritts, MD Follow up.   Specialty: Cardiology Why: Hospital follow-up with Cardiology scheduled for 06/17/2021 at 2pm. Please arrive 15 minutes early for check-in. If this date/time does not work for you, please call our office to reschedule. Contact information: [place] [address] [phone]         [facility] Follow up.   Specialty: Cardiology Why: Hospital follow-up with the [facility] is scheduled for 06/10/2021 at 10am. This is located in the [facility]. You can enter through Entrance  C. Contact information: [address] [id number] [place] [phone]               No Known Allergies  Consultations: Cardiology   Procedures/Studies: DG Chest 1 View  Result Date: 05/30/2021 CLINICAL DATA:  Chest pain with shortness of breath and hematemesis EXAM: CHEST  1 VIEW COMPARISON:  04/02/2021 FINDINGS: Extensive airspace disease on the right. Chronic cardiomegaly and vascular pedicle widening. No Kerley lines, effusion, or pneumothorax. IMPRESSION: Extensive airspace disease on the right. Chronic cardiomegaly. Electronically Signed   By: Jorje Guild M.D.   On: 05/30/2021 07:13   (Echo, Carotid, EGD, Colonoscopy, ERCP)    Subjective: Patient seen and examined.  Denies any acute complaints.  No more cough or hemoptysis.  He thinks his symptoms are well controlled.  He is motivated to go back home and getting right track.   Discharge Exam: Vitals:   06/03/21 1017 06/03/21 1133  BP: 114/86 103/70  Pulse: (!) 46 83  Resp: 16 18  Temp: 98.1 F (36.7 C) (!) 97.5 F (36.4 C)  SpO2: 99% 97%   Vitals:   06/03/21 0556 06/03/21 0750 06/03/21 1017 06/03/21 1133  BP: (!) 148/112  114/86 103/70  Pulse: 81 72 (!)  46 83  Resp: '13 18 16 18  '$ Temp: 98.4 F (36.9 C)  98.1 F (36.7 C) (!) 97.5 F (36.4 C)  TempSrc: Oral  Oral Oral  SpO2: 99% 98% 99% 97%  Weight: 129.4 kg     Height:        General: Pt is alert, awake, not in acute distress Cardiovascular: RRR, S1/S2 +, no rubs, no gallops Respiratory: CTA bilaterally, no wheezing, no rhonchi Abdominal: Soft, NT, ND, bowel sounds +, obese and pendulous. Extremities: no edema, no cyanosis    The results of significant diagnostics from this hospitalization (including imaging, microbiology, ancillary and laboratory) are listed below for reference.     Microbiology: Recent Results (from the past 240 hour(s))  Resp Panel by RT-PCR (Flu A&B, Covid) Nasopharyngeal Swab      Status: None   Collection Time: 05/30/21  6:48 AM   Specimen: Nasopharyngeal Swab; Nasopharyngeal(NP) swabs in vial transport medium  Result Value Ref Range Status   SARS Coronavirus 2 by RT PCR NEGATIVE NEGATIVE Final    Comment: (NOTE) SARS-CoV-2 target nucleic acids are NOT DETECTED.  The SARS-CoV-2 RNA is generally detectable in upper respiratory specimens during the acute phase of infection. The lowest concentration of SARS-CoV-2 viral copies this assay can detect is 138 copies/mL. A negative result does not preclude SARS-Cov-2 infection and should not be used as the sole basis for treatment or other patient management decisions. A negative result may occur with  improper specimen collection/handling, submission of specimen other than nasopharyngeal swab, presence of viral mutation(s) within the areas targeted by this assay, and inadequate number of viral copies(<138 copies/mL). A negative result must be combined with clinical observations, patient history, and epidemiological information. The expected result is Negative.  Fact Sheet for Patients:  EntrepreneurPulse.com.au  Fact Sheet for Healthcare Providers:  IncredibleEmployment.be  This test is no t yet approved or cleared by the [place] FDA and  has been authorized for detection and/or diagnosis of SARS-CoV-2 by FDA under an Emergency Use Authorization (EUA). This EUA will remain  in effect (meaning this test can be used) for the duration of the COVID-19 declaration under Section 564(b)(1) of the Act, 21 U.S.C.section 360bbb-3(b)(1), unless the authorization is terminated  or revoked sooner.       Influenza A by PCR NEGATIVE NEGATIVE Final   Influenza B by PCR NEGATIVE NEGATIVE Final    Comment: (NOTE) The Xpert Xpress SARS-CoV-2/FLU/RSV plus assay is intended as an aid in the diagnosis of influenza from Nasopharyngeal swab specimens and should not be used as a sole basis  for treatment. Nasal washings and aspirates are unacceptable for Xpert Xpress SARS-CoV-2/FLU/RSV testing.  Fact Sheet for Patients: EntrepreneurPulse.com.au  Fact Sheet for Healthcare Providers: IncredibleEmployment.be  This test is not yet approved or cleared by the [place] FDA and has been authorized for detection and/or diagnosis of SARS-CoV-2 by FDA under an Emergency Use Authorization (EUA). This EUA will remain in effect (meaning this test can be used) for the duration of the COVID-19 declaration under Section 564(b)(1) of the Act, 21 U.S.C. section 360bbb-3(b)(1), unless the authorization is terminated or revoked.  Performed at [facility], [place] [address] [place], [place] [address]   Blood culture (routine x 2)     Status: None (Preliminary result)   Collection Time: 05/30/21  8:20 AM   Specimen: BLOOD RIGHT HAND  Result Value Ref Range Status   Specimen Description BLOOD RIGHT HAND  Final   Special Requests  Final    BOTTLES DRAWN AEROBIC AND ANAEROBIC Blood Culture results may not be optimal due to an inadequate volume of blood received in culture bottles   Culture   Final    NO GROWTH 4 DAYS Performed at [facility], [place] [address], [place], [place] [address]    Report Status PENDING  Incomplete  Blood culture (routine x 2)     Status: None (Preliminary result)   Collection Time: 05/30/21  8:22 AM   Specimen: BLOOD  Result Value Ref Range Status   Specimen Description BLOOD LEFT ANTECUBITAL  Final   Special Requests   Final    BOTTLES DRAWN AEROBIC AND ANAEROBIC Blood Culture adequate volume   Culture   Final    NO GROWTH 4 DAYS Performed at [facility], [place] [address], [place], [place] [address]    Report Status PENDING  Incomplete  Expectorated Sputum Assessment w Gram Stain, Rflx to Resp Cult     Status: None   Collection Time: 05/31/21  6:51 AM   Specimen: Expectorated Sputum  Result Value  Ref Range Status   Specimen Description EXPECTORATED SPUTUM  Final   Special Requests NONE  Final   Sputum evaluation   Final    Sputum specimen not acceptable for testing.  Please recollect.   RESULT CALLED TO, READ BACK BY AND VERIFIED WITH: RN Reinaldo Berber [id number] FCP Performed at [facility], [place] [address] [place],  [address]    Report Status 05/31/2021 FINAL  Final     Labs: BNP (last 3 results) Recent Labs    01/20/21 0837 03/30/21 0858 05/30/21 0648  BNP 2,466.4* >4,500.0* [id number]*   Basic Metabolic Panel: Recent Labs  Lab 05/30/21 [id number] 05/30/21 [id number] 05/31/21 0412 06/01/21 0338 06/01/21 0736 06/02/21 0335 06/03/21 0332  NA 137  --  136  --  138 135 137  K 3.0*  --  2.7*  --  3.5 3.6 3.2*  CL 105  --  103  --  105 102 102  CO2 20*  --  22  --  22 21* 23  GLUCOSE 116*  --  120*  --  96 103* 107*  BUN 26*  --  23*  --  26* 31* 33*  CREATININE 2.15*  --  2.16*  --  2.15* 2.01* 2.11*  CALCIUM 9.1  --  8.4*  --  8.4* 8.9 9.5  MG  --  2.1 1.8 2.0  --   --   --    Liver Function Tests: Recent Labs  Lab 05/30/21 0648  AST 30  ALT 34  ALKPHOS 82  BILITOT 1.5*  PROT 7.3  ALBUMIN 3.6   No results for input(s): LIPASE, AMYLASE in the last 168 hours. No results for input(s): AMMONIA in the last 168 hours. CBC: Recent Labs  Lab 05/30/21 0648 05/31/21 0412  WBC 9.1 11.3*  NEUTROABS 6.8  --   HGB 11.9* 11.0*  HCT 39.1 34.3*  MCV 70.1* 67.5*  PLT 222 219   Cardiac Enzymes: No results for input(s): CKTOTAL, CKMB, CKMBINDEX, TROPONINI in the last 168 hours. BNP: Invalid input(s): POCBNP CBG: No results for input(s): GLUCAP in the last 168 hours. D-Dimer No results for input(s): DDIMER in the last 72 hours. Hgb A1c No results for input(s): HGBA1C in the last 72 hours. Lipid Profile No results for input(s): CHOL, HDL, LDLCALC, TRIG, CHOLHDL, LDLDIRECT in the last 72 hours. Thyroid function studies No results for input(s):  TSH, T4TOTAL,  T3FREE, THYROIDAB in the last 72 hours.  Invalid input(s): FREET3 Anemia work up No results for input(s): VITAMINB12, FOLATE, FERRITIN, TIBC, IRON, RETICCTPCT in the last 72 hours. Urinalysis No results found for: COLORURINE, APPEARANCEUR, LABSPEC, [place], GLUCOSEU, [place], [place], [place], PROTEINUR, UROBILINOGEN, NITRITE, LEUKOCYTESUR Sepsis Labs Invalid input(s): PROCALCITONIN,  WBC,  LACTICIDVEN Microbiology Recent Results (from the past 240 hour(s))  Resp Panel by RT-PCR (Flu A&B, Covid) Nasopharyngeal Swab     Status: None   Collection Time: 05/30/21  6:48 AM   Specimen: Nasopharyngeal Swab; Nasopharyngeal(NP) swabs in vial transport medium  Result Value Ref Range Status   SARS Coronavirus 2 by RT PCR NEGATIVE NEGATIVE Final    Comment: (NOTE) SARS-CoV-2 target nucleic acids are NOT DETECTED.  The SARS-CoV-2 RNA is generally detectable in upper respiratory specimens during the acute phase of infection. The lowest concentration of SARS-CoV-2 viral copies this assay can detect is 138 copies/mL. A negative result does not preclude SARS-Cov-2 infection and should not be used as the sole basis for treatment or other patient management decisions. A negative result may occur with  improper specimen collection/handling, submission of specimen other than nasopharyngeal swab, presence of viral mutation(s) within the areas targeted by this assay, and inadequate number of viral copies(<138 copies/mL). A negative result must be combined with clinical observations, patient history, and epidemiological information. The expected result is Negative.  Fact Sheet for Patients:  EntrepreneurPulse.com.au  Fact Sheet for Healthcare Providers:  IncredibleEmployment.be  This test is no t yet approved or cleared by the [place] FDA and  has been authorized for detection and/or diagnosis of SARS-CoV-2 by FDA under an Emergency Use Authorization  (EUA). This EUA will remain  in effect (meaning this test can be used) for the duration of the COVID-19 declaration under Section 564(b)(1) of the Act, 21 U.S.C.section 360bbb-3(b)(1), unless the authorization is terminated  or revoked sooner.       Influenza A by PCR NEGATIVE NEGATIVE Final   Influenza B by PCR NEGATIVE NEGATIVE Final    Comment: (NOTE) The Xpert Xpress SARS-CoV-2/FLU/RSV plus assay is intended as an aid in the diagnosis of influenza from Nasopharyngeal swab specimens and should not be used as a sole basis for treatment. Nasal washings and aspirates are unacceptable for Xpert Xpress SARS-CoV-2/FLU/RSV testing.  Fact Sheet for Patients: EntrepreneurPulse.com.au  Fact Sheet for Healthcare Providers: IncredibleEmployment.be  This test is not yet approved or cleared by the [place] FDA and has been authorized for detection and/or diagnosis of SARS-CoV-2 by FDA under an Emergency Use Authorization (EUA). This EUA will remain in effect (meaning this test can be used) for the duration of the COVID-19 declaration under Section 564(b)(1) of the Act, 21 U.S.C. section 360bbb-3(b)(1), unless the authorization is terminated or revoked.  Performed at [facility], [place] [address] [place], [place] [address]   Blood culture (routine x 2)     Status: None (Preliminary result)   Collection Time: 05/30/21  8:20 AM   Specimen: BLOOD RIGHT HAND  Result Value Ref Range Status   Specimen Description BLOOD RIGHT HAND  Final   Special Requests   Final    BOTTLES DRAWN AEROBIC AND ANAEROBIC Blood Culture results may not be optimal due to an inadequate volume of blood received in culture bottles   Culture   Final    NO GROWTH 4 DAYS Performed at [facility], [place] [address] [place], [place] [address]    Report Status PENDING  Incomplete  Blood culture (routine x 2)     Status: None (Preliminary result)   Collection Time:  05/30/21  8:22 AM   Specimen: BLOOD  Result Value Ref Range Status   Specimen Description BLOOD LEFT ANTECUBITAL  Final   Special Requests   Final    BOTTLES DRAWN AEROBIC AND ANAEROBIC Blood Culture adequate volume   Culture   Final    NO GROWTH 4 DAYS Performed at [facility], [place] [address] [place], [place] [address]    Report Status PENDING  Incomplete  Expectorated Sputum Assessment w Gram Stain, Rflx to Resp Cult     Status: None   Collection Time: 05/31/21  6:51 AM   Specimen: Expectorated Sputum  Result Value Ref Range Status   Specimen Description EXPECTORATED SPUTUM  Final   Special Requests NONE  Final   Sputum evaluation   Final    Sputum specimen not acceptable for testing.  Please recollect.   RESULT CALLED TO, READ BACK BY AND VERIFIED WITH: RN Reinaldo Berber [id number] FCP Performed at [facility], [place] [address] [place],  [address]    Report Status 05/31/2021 FINAL  Final     Time coordinating discharge:  40 minutes  SIGNED:   Barb Merino, MD  [facility] 06/03/2021, 4:25 PM

## 2021-06-03 NOTE — TOC Initial Note (Signed)
Transition of Care [facility]) - Initial/Assessment Note    Patient Details  Name: Gerald [name] MRN: [id number] Date of Birth: May 15, 1981  Transition of Care [facility]) CM/SW Contact:    Gerald Mayo, RN Phone Number: 06/03/2021, 2:02 PM  Clinical Narrative:                 NCM spoke with patient he states he lives in [place],  NCM called left message for Mr. Gerald [name] that patient is for dc today.  He states he will need transportation.    Expected Discharge Plan: Group Home Barriers to Discharge: No Barriers Identified   Patient Goals and CMS Choice Patient states their goals for this hospitalization and ongoing recovery are:: return to group home   Choice offered to / list presented to : NA  Expected Discharge Plan and Services Expected Discharge Plan: Group Home   Discharge Planning Services: CM Consult Post Acute Care Choice: NA Living arrangements for the past 2 months: Group Home                   DME Agency: NA       HH Arranged: NA          Prior Living Arrangements/Services Living arrangements for the past 2 months: Group Home Lives with:: Facility Resident Patient language and need for interpreter reviewed:: Yes Do you feel safe going back to the place where you live?: Yes      Need for Family Participation in Patient Care: No (Comment) Care giver support system in place?: Yes (comment)   Criminal Activity/Legal Involvement Pertinent to Current Situation/Hospitalization: No - Comment as needed  Activities of Daily Living      Permission Sought/Granted                  Emotional Assessment Appearance:: Appears stated age Attitude/Demeanor/Rapport: Engaged Affect (typically observed): Appropriate Orientation: : Oriented to Self, Oriented to Place, Oriented to  Time, Oriented to Situation Alcohol / Substance Use: Not Applicable Psych Involvement: No (comment)  Admission diagnosis:  Chest pain [R07.9] Acute on  chronic combined systolic and diastolic CHF (congestive heart failure) ([name]) [I50.43] Patient Active Problem List   Diagnosis Date Noted   Acute diastolic CHF (congestive heart failure) ([place]) 03/30/2021   CHF (congestive heart failure) ([place]) 03/30/2021   Hypertensive crisis 01/20/2021   Polysubstance abuse ([place]) 01/20/2021   Obesity, Class III, BMI 40-49.9 (morbid obesity) ([place]) 01/20/2021   Hypertensive emergency 12/21/2020   CKD (chronic kidney disease), stage III ([place]) 12/21/2020   Acute on chronic systolic CHF (congestive heart failure) ([place]) 07/05/2020   PAF (paroxysmal atrial fibrillation) ([place]) 07/05/2020   Elevated troponin 07/05/2020   Microcytic anemia 07/05/2020   Hypokalemia 07/05/2020   Malignant hypertension    Acute on chronic combined systolic and diastolic CHF (congestive heart failure) ([place])    Atrial flutter ([place])    Essential hypertension    Acute systolic congestive heart failure ([place])    Acute respiratory failure ([place]) 04/01/2019   NSTEMI (non-ST elevated myocardial infarction) ([place]) 12/21/2015   Atypical pneumonia 12/21/2015   PCP:  Patient, [name] (Inactive) Pharmacy:   [id number] [id number] [id number]: [phone]: [phone] [facility] [address]: [phone]: [phone]  [id number] [facility] [place] [address] [place] [address] Phone: [phone] Fax: [phone]  [facility], [place] - [address] [place] [address] Phone: [phone] Fax: [phone]     Social Determinants of Health ([place]) Interventions    Readmission Risk Interventions Readmission Risk Prevention Plan 06/03/2021 04/02/2021  Transportation Screening Complete Complete  Medication Review ([place]) Complete Complete  PCP or Specialist appointment within 3-5 days of discharge Complete Complete  HRI or Home Care Consult Complete Complete  SW Recovery Care/Counseling Consult Complete Complete  Palliative Care Screening Not Applicable Not [place] Not Applicable Not Applicable  Some recent data might be hidden

## 2021-06-03 NOTE — Progress Notes (Signed)
Progress Note  Patient Name: Gerald [name] Date of Encounter: 06/03/2021  [facility] Cardiologist: Ida Rogue, MD   Subjective   No acute events overnight. Feels a little lightheaded on the meds but able to ambulate. He has a car now and can attend his appointments. [place] is not far for him.  Inpatient Medications    Scheduled Meds:  atorvastatin  40 mg Oral Daily   budesonide (PULMICORT) nebulizer solution  0.25 mg Nebulization BID   carvedilol  25 mg Oral BID WC   docusate sodium  100 mg Oral BID   empagliflozin  10 mg Oral Daily   hydrALAZINE  100 mg Oral Q8H   isosorbide mononitrate  60 mg Oral Daily   nicotine  14 mg Transdermal Daily   rivaroxaban  20 mg Oral Daily   sacubitril-valsartan  1 tablet Oral BID   sodium chloride flush  3 mL Intravenous Q12H   spironolactone  25 mg Oral Daily   torsemide  20 mg Oral Daily   Continuous Infusions:  sodium chloride Stopped (05/31/21 2152)   PRN Meds: sodium chloride, acetaminophen **OR** acetaminophen, bisacodyl, guaiFENesin-codeine, hydrALAZINE, melatonin, morphine injection, ondansetron **OR** ondansetron (ZOFRAN) IV, oxyCODONE, polyethylene glycol   Vital Signs    Vitals:   06/03/21 0556 06/03/21 0750 06/03/21 1017 06/03/21 1133  BP: (!) 148/112  114/86 103/70  Pulse: 81 72 (!) 46 83  Resp: '13 18 16 18  '$ Temp: 98.4 F (36.9 C)  98.1 F (36.7 C) (!) 97.5 F (36.4 C)  TempSrc: Oral  Oral Oral  SpO2: 99% 98% 99% 97%  Weight: 129.4 kg     Height:        Intake/Output Summary (Last 24 hours) at 06/03/2021 1416 Last data filed at 06/03/2021 0900 Gross per 24 hour  Intake 720 ml  Output 2000 ml  Net -1280 ml   Last 3 Weights 06/03/2021 06/02/2021 06/01/2021  Weight (lbs) 285 lb 3.2 oz 287 lb 0.6 oz 291 lb 0.1 oz  Weight (kg) 129.366 kg 130.2 kg 132 kg      Telemetry    NSR - Personally Reviewed  ECG    NSR - Personally Reviewed  Physical Exam   GEN: No acute distress.   Neck: No  JVD Cardiac: RRR, no murmurs, rubs, or gallops.  Respiratory: Clear to auscultation bilaterally. GI: Soft, nontender, non-distended  MS: No edema; No deformity. Neuro:  Nonfocal  Psych: Normal affect   Labs    High Sensitivity Troponin:   Recent Labs  Lab 05/30/21 0648 05/30/21 0822  TROPONINIHS 780* 799*     Chemistry Recent Labs  Lab 05/30/21 0648 05/30/21 0822 05/31/21 0412 06/01/21 0338 06/01/21 0736 06/02/21 0335 06/03/21 0332  NA 137  --  136  --  138 135 137  K 3.0*  --  2.7*  --  3.5 3.6 3.2*  CL 105  --  103  --  105 102 102  CO2 20*  --  22  --  22 21* 23  GLUCOSE 116*  --  120*  --  96 103* 107*  BUN 26*  --  23*  --  26* 31* 33*  CREATININE 2.15*  --  2.16*  --  2.15* 2.01* 2.11*  CALCIUM 9.1  --  8.4*  --  8.4* 8.9 9.5  MG  --  2.1 1.8 2.0  --   --   --   PROT 7.3  --   --   --   --   --   --  ALBUMIN 3.6  --   --   --   --   --   --   AST 30  --   --   --   --   --   --   ALT 34  --   --   --   --   --   --   ALKPHOS 82  --   --   --   --   --   --   BILITOT 1.5*  --   --   --   --   --   --   GFRNONAA 39*  --  39*  --  39* 42* 40*  ANIONGAP 12  --  11  --  '11 12 12    '$ Lipids No results for input(s): CHOL, TRIG, HDL, LABVLDL, LDLCALC, CHOLHDL in the last 168 hours.  Hematology Recent Labs  Lab 05/30/21 0648 05/31/21 0412  WBC 9.1 11.3*  RBC 5.58 5.08  HGB 11.9* 11.0*  HCT 39.1 34.3*  MCV 70.1* 67.5*  MCH 21.3* 21.7*  MCHC 30.4 32.1  RDW 18.2* 17.3*  PLT 222 219   Thyroid No results for input(s): TSH, FREET4 in the last 168 hours.  BNP Recent Labs  Lab 05/30/21 0648  BNP >4,500.0*    DDimer No results for input(s): DDIMER in the last 168 hours.   Radiology    No results found.  Cardiac Studies   TTE 01/20/2021  1. Left ventricular ejection fraction, by estimation, is 30 to 35%. The  left ventricle has moderately decreased function. The left ventricle  demonstrates global hypokinesis. The left ventricular internal cavity size   was mildly dilated. There is severe  left ventricular hypertrophy. Left ventricular diastolic parameters are  indeterminate.   2. Right ventricular systolic function is normal. The right ventricular  size is normal. Moderately increased right ventricular wall thickness.  Tricuspid regurgitation signal is inadequate for assessing PA pressure.   3. Left atrial size was severely dilated.   4. Right atrial size was mildly dilated.   5. The mitral valve is normal in structure. Trivial mitral valve  regurgitation.   6. The aortic valve is tricuspid. Aortic valve regurgitation is trivial.  No aortic stenosis is present.   7. The inferior vena cava is dilated in size with >50% respiratory  variability, suggesting right atrial pressure of 8 mmHg.   Cath [id number] LV end diastolic pressure is moderately elevated.   1. Very large and normal coronary arteries 2. Moderately elevated LVEDP 24 mm Hg   Plan: focus on BP control and optimization of CHF therapy.  Patient Profile     40 y.o. male with PMH atrial flutter, polysubstance abuse, chronic systolic heart failure due to nonischemic cardiomyopathy, chronic kidney disease stage   Assessment & Plan    Acute on chronic systolic and diastolic heart failure  Nonischemic cardiomyopathy cocaine abuse, tobacco use LBBB Hypertension Chronic kidney disease stage 3b -tolerating torsemide 20 mg daily, euvolemic on exam -on guideline directed medical therapy: carvedilol 25 mg BID, spironolactone 25 mg daily, hydralazine 100 mg TID, imdur 60 mg daily, entresto 49-51 mg BID. Blood pressure very well controlled to borderline low, would not escalate therapy at this time. Need to be cautious with MRA and ARNI given renal function -GFR has hovered largely in the 30s. Started on Jardiance this admission, monitor renal function -admission weight 132.9 kg, weight today 129.4 kg, charted net negative 8L but intake not well recorded -needs to remain  abstinent of  cocaine and other substances -repeat echo pending -could consider CRT in the future if he remains otherwise optimized  Atrial flutter, now in SR -continue rivaroxaban  [facility] will sign off.   Medication Recommendations:  continue currently ordered inpatient cardiac medications Other recommendations (labs, testing, etc):  recheck BMET at follow up Follow up as an outpatient:  We will arrange for outpatient follow up with cardiology with [facility] clinic and Dr. Rockey Situ  For questions or updates, please contact [facility] Please consult [email or web address] for contact info under        Signed, Buford Dresser, MD  06/03/2021, 2:16 PM

## 2021-06-04 LAB — CULTURE, BLOOD (ROUTINE X 2)
Culture: NO GROWTH
Culture: NO GROWTH
Special Requests: ADEQUATE

## 2021-06-07 ENCOUNTER — Inpatient Hospital Stay: Payer: Self-pay | Admitting: Nurse Practitioner

## 2021-06-10 ENCOUNTER — Encounter (HOSPITAL_COMMUNITY): Payer: Self-pay

## 2021-06-11 ENCOUNTER — Telehealth (HOSPITAL_COMMUNITY): Payer: Self-pay

## 2021-06-11 ENCOUNTER — Ambulatory Visit: Payer: Self-pay | Admitting: Physician Assistant

## 2021-06-11 NOTE — Telephone Encounter (Signed)
Call attempted to confirm HV TOC appt 10/26 Wednesday at [facility] appropriate VM left with callback number.   Pricilla Holm, MSN, RN Heart Failure Nurse Navigator [phone]

## 2021-06-12 ENCOUNTER — Encounter (HOSPITAL_COMMUNITY): Payer: Self-pay

## 2021-06-16 NOTE — Progress Notes (Deleted)
Cardiology Office Note  Date:  06/16/2021   ID:  SHAHZAD THOMANN II, DOB 28-Feb-1981, MRN [id number]  PCP:  Patient, [name] (Inactive)   No chief complaint on file.   HPI:  Mr. Gerald [name] is a 40 y.o. male with a hx of  Dilated cardiomyopathy, nonischemic Severe LVH  Chronic combined systolic/diastolic CHF,   HTN poorly controlled,   Polysubstance abuse with cocaine and Tobacco   PAF , DCCV not on anticoagulation   Most of his care has been obtained in [place], [place] seen by myself over 2 years ago    non compliant with anticoagulation.  echo showed an EF of 30-35% on 07/05/2020 with trivial MR and AR.   continues to smoke 8-9 cig daily.  cocaine intermittently.  hospitalized in Nov 2021 with CHF exacerbation in the setting of non compliance with meds and ongoing smoking.  His Entresto, carvedilol, Lasix and spir were restarted and he was restarted on Xarelto.     not been able to afford his meds   Rehospitalized Dec 21, 2020 extremely fatigued ,increased DOE  intermittent chest pain   BP 220/172mmHg new LBBB   BNP of 2309 and hsTrop of 1954>2092.    Readmission January 20, 2021, chf Readmission May 30, 2021, vomiting     PMH:   has a past medical history of Arrhythmia, CHF (congestive heart failure) ([place]), Dysrhythmia, Hypertension, and Polysubstance abuse ([place]).  PSH:    Past Surgical History:  Procedure Laterality Date   KNEE SURGERY Right    LEFT HEART CATH AND CORONARY ANGIOGRAPHY N/A 12/21/2020   Procedure: LEFT HEART CATH AND CORONARY ANGIOGRAPHY;  Surgeon: Martinique, Peter M, MD;  Location: [facility];  Service: Cardiovascular;  Laterality: N/A;   TEE WITHOUT CARDIOVERSION N/A 04/04/2019   Procedure: TRANSESOPHAGEAL ECHOCARDIOGRAM (TEE);  Surgeon: [name], MD;  Location: [facility];  Service: Cardiovascular;  Laterality: N/A;    Current Outpatient Medications  Medication Sig Dispense Refill   atorvastatin (LIPITOR) 40 MG  tablet Take 1 tablet (40 mg total) by mouth daily. 30 tablet 0   Caffeine-Magnesium Salicylate (DIUREX PO) Take 1 tablet by mouth daily as needed (bloating).     carvedilol (COREG) 25 MG tablet Take 1 tablet (25 mg total) by mouth 2 (two) times daily with a meal. 60 tablet 0   empagliflozin (JARDIANCE) 10 MG TABS tablet Take 1 tablet (10 mg total) by mouth daily. 30 tablet 2   hydrALAZINE (APRESOLINE) 100 MG tablet Take 1 tablet (100 mg total) by mouth every 8 (eight) hours. 90 tablet 0   isosorbide mononitrate (IMDUR) 60 MG 24 hr tablet Take 1 tablet (60 mg total) by mouth daily. 30 tablet 0   potassium chloride (KLOR-CON) 10 MEQ tablet Take 1 tablet (10 mEq total) by mouth daily. (Patient taking differently: Take 10 mEq by mouth 2 (two) times daily.) 30 tablet 0   rivaroxaban (XARELTO) 20 MG TABS tablet Take 1 tablet (20 mg total) by mouth daily with supper. 30 tablet 0   sacubitril-valsartan (ENTRESTO) 49-51 MG Take 1 tablet by mouth 2 (two) times daily. 60 tablet 0   spironolactone (ALDACTONE) 25 MG tablet Take 1 tablet (25 mg total) by mouth daily for 30 doses. 30 tablet 0   torsemide (DEMADEX) 20 MG tablet Take 1 tablet (20 mg total) by mouth daily. 30 tablet 0   No current facility-administered medications for this visit.     Allergies:   Patient has no  known allergies.   Social History:  The patient  reports that he has been smoking cigarettes. He has a 21.00 pack-year smoking history. He has never used smokeless tobacco. He reports current alcohol use. He reports current drug use. Drugs: Cocaine and Marijuana.   Family History:   family history includes Heart disease in his paternal grandfather; Hypertension in his father.    Review of Systems: ROS   PHYSICAL EXAM: VS:  There were no vitals taken for this visit. , BMI There is no height or weight on file to calculate BMI. GEN: Well nourished, well developed, in no acute distress HEENT: normal Neck: no JVD, carotid bruits, or  masses Cardiac: RRR; no murmurs, rubs, or gallops,no edema  Respiratory:  clear to auscultation bilaterally, normal work of breathing GI: soft, nontender, nondistended, + BS MS: no deformity or atrophy Skin: warm and dry, no rash Neuro:  Strength and sensation are intact Psych: euthymic mood, full affect    Recent Labs: 05/30/2021: ALT 34; B Natriuretic Peptide >4,500.0 05/31/2021: Hemoglobin 11.0; Platelets 219 06/01/2021: Magnesium 2.0 06/03/2021: BUN 33; Creatinine, Ser 2.11; Potassium 3.2; Sodium 137    Lipid Panel Lab Results  Component Value Date   CHOL 163 01/20/2021   HDL 44 01/20/2021   LDLCALC 107 (H) 01/20/2021   TRIG 61 01/20/2021      Wt Readings from Last 3 Encounters:  06/03/21 285 lb 3.2 oz (129.4 kg)  04/02/21 276 lb 10.8 oz (125.5 kg)  01/29/21 283 lb 6 oz (128.5 kg)       ASSESSMENT AND PLAN:  Problem List Items Addressed This Visit   None    Disposition:   F/U  12 months   Total encounter time more than 25 minutes  Greater than 50% was spent in counseling and coordination of care with the patient    Signed, Esmond Plants, M.D., Ph.D. [place], [place]

## 2021-06-17 ENCOUNTER — Ambulatory Visit: Payer: Self-pay | Admitting: Cardiovascular Disease

## 2021-06-17 DIAGNOSIS — I517 Cardiomegaly: Secondary | ICD-10-CM

## 2021-06-17 DIAGNOSIS — E785 Hyperlipidemia, unspecified: Secondary | ICD-10-CM

## 2021-06-17 DIAGNOSIS — I5022 Chronic systolic (congestive) heart failure: Secondary | ICD-10-CM

## 2021-06-17 DIAGNOSIS — I10 Essential (primary) hypertension: Secondary | ICD-10-CM

## 2021-06-17 DIAGNOSIS — I480 Paroxysmal atrial fibrillation: Secondary | ICD-10-CM

## 2021-06-17 DIAGNOSIS — I4892 Unspecified atrial flutter: Secondary | ICD-10-CM

## 2021-06-17 DIAGNOSIS — F1410 Cocaine abuse, uncomplicated: Secondary | ICD-10-CM

## 2021-06-17 DIAGNOSIS — N1832 Chronic kidney disease, stage 3b: Secondary | ICD-10-CM

## 2021-06-18 ENCOUNTER — Encounter: Payer: Self-pay | Admitting: Cardiovascular Disease

## 2021-06-19 ENCOUNTER — Ambulatory Visit (INDEPENDENT_AMBULATORY_CARE_PROVIDER_SITE_OTHER): Payer: Self-pay | Admitting: Primary Care

## 2021-07-02 NOTE — Telephone Encounter (Signed)
**Note De-identified  Obfuscation** ERROR

## 2021-07-07 ENCOUNTER — Emergency Department (HOSPITAL_COMMUNITY): Payer: Self-pay

## 2021-07-07 ENCOUNTER — Inpatient Hospital Stay (HOSPITAL_COMMUNITY)
Admission: EM | Admit: 2021-07-07 | Discharge: 2021-07-11 | DRG: 291 | Discharge status: Against Medical Advice | Payer: Self-pay | Attending: Family Medicine | Admitting: Family Medicine

## 2021-07-07 ENCOUNTER — Encounter (HOSPITAL_COMMUNITY): Payer: Self-pay | Admitting: Emergency Medicine

## 2021-07-07 ENCOUNTER — Other Ambulatory Visit: Payer: Self-pay

## 2021-07-07 DIAGNOSIS — R079 Chest pain, unspecified: Secondary | ICD-10-CM

## 2021-07-07 DIAGNOSIS — I809 Phlebitis and thrombophlebitis of unspecified site: Secondary | ICD-10-CM | POA: Diagnosis present

## 2021-07-07 DIAGNOSIS — F1410 Cocaine abuse, uncomplicated: Secondary | ICD-10-CM | POA: Diagnosis present

## 2021-07-07 DIAGNOSIS — I10 Essential (primary) hypertension: Secondary | ICD-10-CM | POA: Diagnosis present

## 2021-07-07 DIAGNOSIS — F1910 Other psychoactive substance abuse, uncomplicated: Secondary | ICD-10-CM | POA: Diagnosis present

## 2021-07-07 DIAGNOSIS — Z6841 Body Mass Index (BMI) 40.0 and over, adult: Secondary | ICD-10-CM

## 2021-07-07 DIAGNOSIS — Z5329 Procedure and treatment not carried out because of patient's decision for other reasons: Secondary | ICD-10-CM | POA: Diagnosis not present

## 2021-07-07 DIAGNOSIS — G2581 Restless legs syndrome: Secondary | ICD-10-CM

## 2021-07-07 DIAGNOSIS — Z91119 Patient's noncompliance with dietary regimen due to unspecified reason: Secondary | ICD-10-CM

## 2021-07-07 DIAGNOSIS — Z91199 Patient's noncompliance with other medical treatment and regimen due to unspecified reason: Secondary | ICD-10-CM

## 2021-07-07 DIAGNOSIS — R778 Other specified abnormalities of plasma proteins: Secondary | ICD-10-CM

## 2021-07-07 DIAGNOSIS — Z8249 Family history of ischemic heart disease and other diseases of the circulatory system: Secondary | ICD-10-CM

## 2021-07-07 DIAGNOSIS — N1832 Chronic kidney disease, stage 3b: Secondary | ICD-10-CM | POA: Diagnosis present

## 2021-07-07 DIAGNOSIS — F1210 Cannabis abuse, uncomplicated: Secondary | ICD-10-CM | POA: Diagnosis present

## 2021-07-07 DIAGNOSIS — J189 Pneumonia, unspecified organism: Secondary | ICD-10-CM

## 2021-07-07 DIAGNOSIS — I4892 Unspecified atrial flutter: Secondary | ICD-10-CM | POA: Diagnosis present

## 2021-07-07 DIAGNOSIS — I447 Left bundle-branch block, unspecified: Secondary | ICD-10-CM | POA: Diagnosis present

## 2021-07-07 DIAGNOSIS — E8770 Fluid overload, unspecified: Secondary | ICD-10-CM

## 2021-07-07 DIAGNOSIS — N183 Chronic kidney disease, stage 3 unspecified: Secondary | ICD-10-CM | POA: Diagnosis present

## 2021-07-07 DIAGNOSIS — I160 Hypertensive urgency: Secondary | ICD-10-CM

## 2021-07-07 DIAGNOSIS — Z79899 Other long term (current) drug therapy: Secondary | ICD-10-CM

## 2021-07-07 DIAGNOSIS — I428 Other cardiomyopathies: Secondary | ICD-10-CM | POA: Diagnosis present

## 2021-07-07 DIAGNOSIS — E66813 Obesity, class 3: Secondary | ICD-10-CM | POA: Diagnosis present

## 2021-07-07 DIAGNOSIS — Z20822 Contact with and (suspected) exposure to covid-19: Secondary | ICD-10-CM | POA: Diagnosis present

## 2021-07-07 DIAGNOSIS — Z7901 Long term (current) use of anticoagulants: Secondary | ICD-10-CM

## 2021-07-07 DIAGNOSIS — N1831 Chronic kidney disease, stage 3a: Secondary | ICD-10-CM | POA: Diagnosis present

## 2021-07-07 DIAGNOSIS — D509 Iron deficiency anemia, unspecified: Secondary | ICD-10-CM | POA: Diagnosis present

## 2021-07-07 DIAGNOSIS — F17210 Nicotine dependence, cigarettes, uncomplicated: Secondary | ICD-10-CM | POA: Diagnosis present

## 2021-07-07 DIAGNOSIS — I5043 Acute on chronic combined systolic (congestive) and diastolic (congestive) heart failure: Secondary | ICD-10-CM | POA: Diagnosis present

## 2021-07-07 DIAGNOSIS — Z9114 Patient's other noncompliance with medication regimen: Secondary | ICD-10-CM

## 2021-07-07 DIAGNOSIS — I161 Hypertensive emergency: Secondary | ICD-10-CM | POA: Diagnosis present

## 2021-07-07 DIAGNOSIS — E785 Hyperlipidemia, unspecified: Secondary | ICD-10-CM | POA: Diagnosis present

## 2021-07-07 DIAGNOSIS — I480 Paroxysmal atrial fibrillation: Secondary | ICD-10-CM | POA: Diagnosis present

## 2021-07-07 DIAGNOSIS — E876 Hypokalemia: Secondary | ICD-10-CM | POA: Diagnosis present

## 2021-07-07 DIAGNOSIS — I248 Other forms of acute ischemic heart disease: Secondary | ICD-10-CM | POA: Diagnosis present

## 2021-07-07 DIAGNOSIS — I130 Hypertensive heart and chronic kidney disease with heart failure and stage 1 through stage 4 chronic kidney disease, or unspecified chronic kidney disease: Principal | ICD-10-CM | POA: Diagnosis present

## 2021-07-07 DIAGNOSIS — R042 Hemoptysis: Secondary | ICD-10-CM | POA: Diagnosis present

## 2021-07-07 HISTORY — DX: Chronic kidney disease, stage 3 unspecified: N18.30

## 2021-07-07 HISTORY — DX: Unspecified atrial flutter: I48.92

## 2021-07-07 HISTORY — DX: Chronic combined systolic (congestive) and diastolic (congestive) heart failure: I50.42

## 2021-07-07 LAB — CBC
HCT: 39.8 % (ref 39.0–52.0)
Hemoglobin: 11.7 g/dL — ABNORMAL LOW (ref 13.0–17.0)
MCH: 21.2 pg — ABNORMAL LOW (ref 26.0–34.0)
MCHC: 29.4 g/dL — ABNORMAL LOW (ref 30.0–36.0)
MCV: 72.2 fL — ABNORMAL LOW (ref 80.0–100.0)
Platelets: 206 10*3/uL (ref 150–400)
RBC: 5.51 MIL/uL (ref 4.22–5.81)
RDW: 19.3 % — ABNORMAL HIGH (ref 11.5–15.5)
WBC: 11.5 10*3/uL — ABNORMAL HIGH (ref 4.0–10.5)
nRBC: 0 % (ref 0.0–0.2)

## 2021-07-07 LAB — RESP PANEL BY RT-PCR (FLU A&B, COVID) ARPGX2
Influenza A by PCR: NEGATIVE
Influenza B by PCR: NEGATIVE
SARS Coronavirus 2 by RT PCR: NEGATIVE

## 2021-07-07 LAB — BASIC METABOLIC PANEL
Anion gap: 11 (ref 5–15)
BUN: 33 mg/dL — ABNORMAL HIGH (ref 6–20)
CO2: 21 mmol/L — ABNORMAL LOW (ref 22–32)
Calcium: 9.1 mg/dL (ref 8.9–10.3)
Chloride: 107 mmol/L (ref 98–111)
Creatinine, Ser: 1.98 mg/dL — ABNORMAL HIGH (ref 0.61–1.24)
GFR, Estimated: 43 mL/min — ABNORMAL LOW (ref >60)
Glucose, Bld: 113 mg/dL — ABNORMAL HIGH (ref 70–99)
Potassium: 3.3 mmol/L — ABNORMAL LOW (ref 3.5–5.1)
Sodium: 139 mmol/L (ref 135–145)

## 2021-07-07 LAB — TROPONIN I (HIGH SENSITIVITY)
Troponin I (High Sensitivity): 762 ng/L (ref <18)
Troponin I (High Sensitivity): 790 ng/L (ref <18)
Troponin I (High Sensitivity): 697 ng/L (ref <18)
Troponin I (High Sensitivity): 705 ng/L (ref <18)
Troponin I (High Sensitivity): 784 ng/L (ref <18)

## 2021-07-07 LAB — LACTIC ACID, PLASMA
Lactic Acid, Venous: 0.8 mmol/L (ref 0.5–1.9)
Lactic Acid, Venous: 0.9 mmol/L (ref 0.5–1.9)

## 2021-07-07 LAB — BRAIN NATRIURETIC PEPTIDE: B Natriuretic Peptide: 1323.8 pg/mL — ABNORMAL HIGH (ref 0.0–100.0)

## 2021-07-07 LAB — HIV ANTIBODY (ROUTINE TESTING W REFLEX): HIV Screen 4th Generation wRfx: NONREACTIVE

## 2021-07-07 LAB — HEPATIC FUNCTION PANEL
ALT: 38 U/L (ref 0–44)
AST: 31 U/L (ref 15–41)
Albumin: 3.6 g/dL (ref 3.5–5.0)
Alkaline Phosphatase: 68 U/L (ref 38–126)
Bilirubin, Direct: 0.1 mg/dL (ref 0.0–0.2)
Indirect Bilirubin: 1 mg/dL — ABNORMAL HIGH (ref 0.3–0.9)
Total Bilirubin: 1.1 mg/dL (ref 0.3–1.2)
Total Protein: 7.1 g/dL (ref 6.5–8.1)

## 2021-07-07 LAB — MAGNESIUM: Magnesium: 2.1 mg/dL (ref 1.7–2.4)

## 2021-07-07 MED ORDER — SODIUM CHLORIDE 0.9% FLUSH: 3.0000 mL | Freq: Two times a day (BID) | INTRAVENOUS | Status: DC

## 2021-07-07 MED ORDER — CARVEDILOL 12.5 MG PO TABS
25.0000 mg | ORAL_TABLET | Freq: Two times a day (BID) | ORAL | Status: DC
  Administered 2021-07-07 – 2021-07-11 (×8): 25 mg via ORAL
  Filled 2021-07-07 (×11): qty 2

## 2021-07-07 MED ORDER — ACETAMINOPHEN 325 MG PO TABS
650.0000 mg | ORAL_TABLET | Freq: Four times a day (QID) | ORAL | Status: DC | PRN
  Administered 2021-07-07 – 2021-07-09 (×4): 650 mg via ORAL
  Filled 2021-07-07 (×4): qty 2

## 2021-07-07 MED ORDER — HYDRALAZINE HCL 50 MG PO TABS
100.0000 mg | ORAL_TABLET | Freq: Three times a day (TID) | ORAL | Status: DC
  Administered 2021-07-07 – 2021-07-11 (×13): 100 mg via ORAL
  Filled 2021-07-07 (×13): qty 2

## 2021-07-07 MED ORDER — ONDANSETRON HCL 4 MG PO TABS: 4.0000 mg | ORAL_TABLET | Freq: Four times a day (QID) | ORAL | Status: DC | PRN

## 2021-07-07 MED ORDER — ONDANSETRON HCL 4 MG/2ML IJ SOLN: 4.0000 mg | Freq: Four times a day (QID) | INTRAMUSCULAR | Status: DC | PRN

## 2021-07-07 MED ORDER — IOHEXOL 350 MG/ML SOLN
65.0000 mL | Freq: Once | INTRAVENOUS | Status: AC | PRN
  Administered 2021-07-07: 65 mL via INTRAVENOUS

## 2021-07-07 MED ORDER — BISACODYL 5 MG PO TBEC: 5.0000 mg | DELAYED_RELEASE_TABLET | Freq: Every day | ORAL | Status: DC | PRN

## 2021-07-07 MED ORDER — SODIUM CHLORIDE 0.9 % IV SOLN
500.0000 mg | Freq: Once | INTRAVENOUS | Status: AC
  Administered 2021-07-07: 500 mg via INTRAVENOUS
  Filled 2021-07-07: qty 500

## 2021-07-07 MED ORDER — RIVAROXABAN 10 MG PO TABS: 20.0000 mg | ORAL_TABLET | Freq: Every day | ORAL | Status: DC

## 2021-07-07 MED ORDER — MORPHINE SULFATE (PF) 2 MG/ML IV SOLN
2.0000 mg | INTRAVENOUS | Status: DC | PRN
  Administered 2021-07-09: 2 mg via INTRAVENOUS
  Filled 2021-07-07: qty 1

## 2021-07-07 MED ORDER — DOCUSATE SODIUM 100 MG PO CAPS
100.0000 mg | ORAL_CAPSULE | Freq: Two times a day (BID) | ORAL | Status: DC
  Administered 2021-07-07 – 2021-07-11 (×6): 100 mg via ORAL
  Filled 2021-07-07 (×8): qty 1

## 2021-07-07 MED ORDER — SODIUM CHLORIDE 0.9 % IV SOLN
500.0000 mg | INTRAVENOUS | Status: DC
  Administered 2021-07-08 – 2021-07-10 (×3): 500 mg via INTRAVENOUS
  Filled 2021-07-07 (×3): qty 500

## 2021-07-07 MED ORDER — SODIUM CHLORIDE 0.9 % IV SOLN
1.0000 g | Freq: Once | INTRAVENOUS | Status: AC
  Administered 2021-07-07: 1 g via INTRAVENOUS
  Filled 2021-07-07: qty 10

## 2021-07-07 MED ORDER — NITROGLYCERIN IN D5W 200-5 MCG/ML-% IV SOLN
0.0000 ug/min | INTRAVENOUS | Status: DC
  Administered 2021-07-07: 5 ug/min via INTRAVENOUS
  Administered 2021-07-08: 45 ug/min via INTRAVENOUS
  Administered 2021-07-08: 60 ug/min via INTRAVENOUS
  Filled 2021-07-07 (×3): qty 250

## 2021-07-07 MED ORDER — FUROSEMIDE 10 MG/ML IJ SOLN
40.0000 mg | Freq: Two times a day (BID) | INTRAMUSCULAR | Status: DC
  Administered 2021-07-07 – 2021-07-09 (×4): 40 mg via INTRAVENOUS
  Filled 2021-07-07 (×4): qty 4

## 2021-07-07 MED ORDER — ATORVASTATIN CALCIUM 40 MG PO TABS
40.0000 mg | ORAL_TABLET | Freq: Every day | ORAL | Status: DC
  Administered 2021-07-07 – 2021-07-08 (×2): 40 mg via ORAL
  Filled 2021-07-07 (×2): qty 1

## 2021-07-07 MED ORDER — ACETAMINOPHEN 650 MG RE SUPP: 650.0000 mg | Freq: Four times a day (QID) | RECTAL | Status: DC | PRN

## 2021-07-07 MED ORDER — OXYCODONE HCL 5 MG PO TABS
5.0000 mg | ORAL_TABLET | ORAL | Status: DC | PRN
  Administered 2021-07-09: 5 mg via ORAL
  Filled 2021-07-07: qty 1

## 2021-07-07 MED ORDER — HYDRALAZINE HCL 20 MG/ML IJ SOLN: 5.0000 mg | INTRAMUSCULAR | Status: DC | PRN

## 2021-07-07 MED ORDER — NICOTINE 14 MG/24HR TD PT24
14.0000 mg | MEDICATED_PATCH | Freq: Every day | TRANSDERMAL | Status: DC
  Administered 2021-07-10: 14 mg via TRANSDERMAL
  Filled 2021-07-07 (×4): qty 1

## 2021-07-07 MED ORDER — POTASSIUM CHLORIDE CRYS ER 20 MEQ PO TBCR: 40.0000 meq | EXTENDED_RELEASE_TABLET | Freq: Once | ORAL | Status: DC

## 2021-07-07 MED ORDER — SODIUM CHLORIDE 0.9 % IV SOLN
1.0000 g | INTRAVENOUS | Status: DC
  Administered 2021-07-08 – 2021-07-10 (×3): 1 g via INTRAVENOUS
  Filled 2021-07-07 (×3): qty 10

## 2021-07-07 MED ORDER — POLYETHYLENE GLYCOL 3350 17 G PO PACK: 17.0000 g | PACK | Freq: Every day | ORAL | Status: DC | PRN

## 2021-07-07 MED ORDER — SPIRONOLACTONE 25 MG PO TABS
25.0000 mg | ORAL_TABLET | Freq: Every day | ORAL | Status: DC
  Administered 2021-07-07 – 2021-07-08 (×2): 25 mg via ORAL
  Filled 2021-07-07 (×2): qty 1

## 2021-07-07 MED ORDER — POTASSIUM CHLORIDE CRYS ER 20 MEQ PO TBCR
40.0000 meq | EXTENDED_RELEASE_TABLET | Freq: Three times a day (TID) | ORAL | Status: AC
  Administered 2021-07-07 – 2021-07-08 (×3): 40 meq via ORAL
  Filled 2021-07-07 (×3): qty 2

## 2021-07-07 MED ORDER — ASPIRIN EC 81 MG PO TBEC
81.0000 mg | DELAYED_RELEASE_TABLET | Freq: Every day | ORAL | Status: DC
  Administered 2021-07-07 – 2021-07-08 (×2): 81 mg via ORAL
  Filled 2021-07-07 (×2): qty 1

## 2021-07-07 MED ORDER — SODIUM CHLORIDE 0.9% FLUSH
3.0000 mL | Freq: Two times a day (BID) | INTRAVENOUS | Status: DC
  Administered 2021-07-07 – 2021-07-11 (×8): 3 mL via INTRAVENOUS

## 2021-07-07 MED ORDER — FUROSEMIDE 10 MG/ML IJ SOLN
60.0000 mg | Freq: Once | INTRAMUSCULAR | Status: AC
  Administered 2021-07-07: 60 mg via INTRAVENOUS
  Filled 2021-07-07: qty 6

## 2021-07-07 NOTE — TOC Initial Note (Signed)
Transition of Care [facility]) - Initial/Assessment Note    Patient Details  Name: Gerald [name] MRN: [id number] Date of Birth: July 29, 1981  [facility] of [facility]) CM/SW Contact:    Verdell Carmine, RN Phone Number: 07/07/2021, 11:45 AM  Clinical Narrative:                  Patient presented with hemoptysis in the ED. History of CHF. CT limited study doe snot define PE, has enlarged Pulmonary artery. Has been admitted previously for the same symptoms.  Patient has no PCP, recommended [facility], as the patient is uninsured, however employed. Has already utilized Medication assistance program [facility]).  CM will follow for ongoing needs, recommendations, and transitions.  Expected Discharge Plan: Home/Self Care Barriers to Discharge: Inadequate or no insurance   Patient Goals and CMS Choice        Expected Discharge Plan and Services Expected Discharge Plan: Home/Self Care       Living arrangements for the past 2 months: Single Family Home                                      Prior Living Arrangements/Services Living arrangements for the past 2 months: Single Family Home   Patient language and need for interpreter reviewed:: Yes        Need for Family Participation in Patient Care: Yes (Comment) Care giver support system in place?: Yes (comment)   Criminal Activity/Legal Involvement Pertinent to Current Situation/Hospitalization: No - Comment as needed  Activities of Daily Living      Permission Sought/Granted                  Emotional Assessment       Orientation: : Oriented to Self, Oriented to Place, Oriented to  Time, Oriented to Situation   Psych Involvement: No (comment)  Admission diagnosis:  Acute on chronic combined systolic and diastolic CHF (congestive heart failure) ([name]) [I50.43] Patient Active Problem List   Diagnosis Date Noted   Acute diastolic CHF (congestive heart failure) ([place]) 03/30/2021   CHF (congestive heart failure)  ([place]) 03/30/2021   Hypertensive crisis 01/20/2021   Polysubstance abuse ([place]) 01/20/2021   Obesity, Class III, BMI 40-49.9 (morbid obesity) ([place]) 01/20/2021   Hypertensive emergency 12/21/2020   CKD (chronic kidney disease), stage III ([place]) 12/21/2020   Acute on chronic systolic CHF (congestive heart failure) ([place]) 07/05/2020   PAF (paroxysmal atrial fibrillation) ([place]) 07/05/2020   Elevated troponin 07/05/2020   Microcytic anemia 07/05/2020   Hypokalemia 07/05/2020   Malignant hypertension    Acute on chronic combined systolic and diastolic CHF (congestive heart failure) ([name])    Atrial flutter ([name])    Essential hypertension    Acute systolic congestive heart failure ([name])    Acute respiratory failure ([place]) 04/01/2019   NSTEMI (non-ST elevated myocardial infarction) ([place]) 12/21/2015   Atypical pneumonia 12/21/2015   PCP:  Patient, [name] (Inactive) Pharmacy:   [id number] [place] [place] [address] [place] [address] Phone: [phone] Fax: [phone]  [facility] [address] [place] [address] Phone: [phone] Fax: [phone]  [id number] [place], [place] [address] Phone: [phone] Fax: [phone]  [facility] [employer] [place] [address] Phone: [phone] Fax: [phone]     Social Determinants of Health (SDOH) Interventions    Readmission Risk Interventions Readmission Risk Prevention Plan 06/03/2021 04/02/2021  Transportation Screening Complete Complete  Medication Review Press photographer) Complete Complete  PCP or Specialist appointment within 3-5 days of discharge Complete Complete  HRI or Home Care Consult Complete Complete  SW Recovery Care/Counseling Consult Complete Complete  Palliative Care Screening  Not Applicable Not [place] Not Applicable Not Applicable  Some recent data might be hidden

## 2021-07-07 NOTE — ED Provider Notes (Signed)
[facility] EMERGENCY DEPARTMENT Provider Note   CSN: [id number] Arrival date & time: 07/07/21  0409     History Chief Complaint  Patient presents with   Cough   Hemoptysis    Gerald [name] is a 40 y.o. male.  The history is provided by the patient and medical records. No language interpreter was used.  Cough Cough characteristics:  Productive Sputum characteristics:  Bloody Severity:  Moderate Onset quality:  Gradual Timing:  Constant Progression:  Worsening Chronicity:  Recurrent Relieved by:  Nothing Worsened by:  Deep breathing Ineffective treatments:  None tried Associated symptoms: chest pain, chills and shortness of breath   Associated symptoms: no diaphoresis, no fever, no headaches, no rash, no rhinorrhea, no sinus congestion and no wheezing       Past Medical History:  Diagnosis Date   Arrhythmia    CHF (congestive heart failure) ([name])    Dysrhythmia    brief episode afib, cardioverted did not return   Hypertension    Polysubstance abuse [facility])     Patient Active Problem List   Diagnosis Date Noted   Acute diastolic CHF (congestive heart failure) ([place]) 03/30/2021   CHF (congestive heart failure) ([place]) 03/30/2021   Hypertensive crisis 01/20/2021   Polysubstance abuse ([place]) 01/20/2021   Obesity, Class III, BMI 40-49.9 (morbid obesity) ([place]) 01/20/2021   Hypertensive emergency 12/21/2020   CKD (chronic kidney disease), stage III ([place]) 12/21/2020   Acute on chronic systolic CHF (congestive heart failure) ([name]) 07/05/2020   PAF (paroxysmal atrial fibrillation) ([name]) 07/05/2020   Elevated troponin 07/05/2020   Microcytic anemia 07/05/2020   Hypokalemia 07/05/2020   Malignant hypertension    Acute on chronic combined systolic and diastolic CHF (congestive heart failure) ([name])    Atrial flutter ([name])    Essential hypertension    Acute systolic congestive heart failure ([name])    Acute respiratory failure ([place]) 04/01/2019   NSTEMI  (non-ST elevated myocardial infarction) ([place]) 12/21/2015   Atypical pneumonia 12/21/2015    Past Surgical History:  Procedure Laterality Date   KNEE SURGERY Right    LEFT HEART CATH AND CORONARY ANGIOGRAPHY N/A 12/21/2020   Procedure: LEFT HEART CATH AND CORONARY ANGIOGRAPHY;  Surgeon: Martinique, Peter M, MD;  Location: [facility];  Service: Cardiovascular;  Laterality: N/A;   TEE WITHOUT CARDIOVERSION N/A 04/04/2019   Procedure: TRANSESOPHAGEAL ECHOCARDIOGRAM (TEE);  Surgeon: [name], MD;  Location: [facility] ORS;  Service: Cardiovascular;  Laterality: N/A;       Family History  Problem Relation Age of Onset   Hypertension Father    Heart disease Paternal Grandfather     Social History   Tobacco Use   Smoking status: Every Day    Packs/day: 0.75    Years: 28.00    Pack years: 21.00    Types: Cigarettes   Smokeless tobacco: Never   Tobacco comments:    declines patch  Vaping Use   Vaping Use: Never used  Substance Use Topics   Alcohol use: Yes    Comment: occas.    Drug use: Yes    Types: Cocaine, Marijuana    Comment: last THC use 4 days ago, cocaine 3 days ago    Home Medications Prior to Admission medications   Medication Sig Start Date End Date Taking? Authorizing Provider  atorvastatin (LIPITOR) 40 MG tablet Take 1 tablet (40 mg total) by mouth daily. 06/03/21 07/03/21  Barb Merino, MD  Caffeine-Magnesium Salicylate (DIUREX PO) Take 1 tablet by  mouth daily as needed (bloating).    [provider]  carvedilol (COREG) 25 MG tablet Take 1 tablet (25 mg total) by mouth 2 (two) times daily with a meal. 06/03/21 07/03/21  Barb Merino, MD  empagliflozin (JARDIANCE) 10 MG TABS tablet Take 1 tablet (10 mg total) by mouth daily. 06/04/21   O'Neal, Cassie Freer, MD  hydrALAZINE (APRESOLINE) 100 MG tablet Take 1 tablet (100 mg total) by mouth every 8 (eight) hours. 06/03/21 07/03/21  Barb Merino, MD  isosorbide mononitrate (IMDUR) 60 MG 24 hr  tablet Take 1 tablet (60 mg total) by mouth daily. 06/04/21 07/04/21  Barb Merino, MD  potassium chloride (KLOR-CON) 10 MEQ tablet Take 1 tablet (10 mEq total) by mouth daily. Patient taking differently: Take 10 mEq by mouth 2 (two) times daily. 04/02/21 05/30/21  Arrien, Jimmy Picket, MD  rivaroxaban (XARELTO) 20 MG TABS tablet Take 1 tablet (20 mg total) by mouth daily with supper. 04/02/21 05/30/21  Arrien, Jimmy Picket, MD  spironolactone (ALDACTONE) 25 MG tablet Take 1 tablet (25 mg total) by mouth daily for 30 doses. 04/02/21 05/30/21  Arrien, Jimmy Picket, MD  torsemide (DEMADEX) 20 MG tablet Take 1 tablet (20 mg total) by mouth daily. 06/04/21 07/04/21  Barb Merino, MD    Allergies    Patient has no known allergies.  Review of Systems   Review of Systems  Constitutional:  Positive for chills. Negative for diaphoresis, fatigue and fever.  HENT:  Negative for congestion and rhinorrhea.   Eyes:  Negative for visual disturbance.  Respiratory:  Positive for cough, chest tightness and shortness of breath. Negative for wheezing.   Cardiovascular:  Positive for chest pain and leg swelling. Negative for palpitations.  Gastrointestinal:  Negative for abdominal pain, constipation, diarrhea, nausea and vomiting.  Genitourinary:  Negative for dysuria and flank pain.  Musculoskeletal:  Negative for back pain and neck pain.  Skin:  Negative for rash.  Neurological:  Negative for dizziness, light-headedness and headaches.  Psychiatric/Behavioral:  Negative for agitation.   All other systems reviewed and are negative.  Physical Exam Updated Vital Signs BP (!) 191/134 (BP Location: Left Arm)   Pulse (!) 102   Temp 99.1 F (37.3 C) (Oral)   Resp 15   SpO2 96%   Physical Exam Vitals and nursing note reviewed.  Constitutional:      General: He is not in acute distress.    Appearance: He is well-developed. He is not ill-appearing, toxic-appearing or diaphoretic.  HENT:      Head: Normocephalic and atraumatic.     Nose: No congestion or rhinorrhea.  Eyes:     Conjunctiva/sclera: Conjunctivae normal.     Pupils: Pupils are equal, round, and reactive to light.  Cardiovascular:     Rate and Rhythm: Regular rhythm. Tachycardia present.     Pulses: Normal pulses.     Heart sounds: No murmur heard. Pulmonary:     Effort: Pulmonary effort is normal. No respiratory distress.     Breath sounds: Rhonchi and rales present. No wheezing.  Chest:     Chest wall: No tenderness.  Abdominal:     General: Abdomen is flat.     Palpations: Abdomen is soft.     Tenderness: There is no abdominal tenderness. There is no guarding or rebound.  Musculoskeletal:        General: No swelling or tenderness.     Cervical back: Neck supple. No tenderness.     Right lower leg: Edema present.  Left lower leg: Edema present.  Skin:    General: Skin is warm and dry.     Capillary Refill: Capillary refill takes less than 2 seconds.     Findings: No erythema or rash.  Neurological:     General: No focal deficit present.     Mental Status: He is alert.     Sensory: No sensory deficit.     Motor: No weakness.  Psychiatric:        Mood and Affect: Mood normal.    ED Results / Procedures / Treatments   Labs (all labs ordered are listed, but only abnormal results are displayed) Labs Reviewed  BASIC METABOLIC PANEL - Abnormal; Notable for the following components:      Result Value   Potassium 3.3 (*)    CO2 21 (*)    Glucose, Bld 113 (*)    BUN 33 (*)    Creatinine, Ser 1.98 (*)    GFR, Estimated 43 (*)    All other components within normal limits  CBC - Abnormal; Notable for the following components:   WBC 11.5 (*)    Hemoglobin 11.7 (*)    MCV 72.2 (*)    MCH 21.2 (*)    MCHC 29.4 (*)    RDW 19.3 (*)    All other components within normal limits  BRAIN NATRIURETIC PEPTIDE - Abnormal; Notable for the following components:   B Natriuretic Peptide 1,323.8 (*)    All  other components within normal limits  TROPONIN I (HIGH SENSITIVITY) - Abnormal; Notable for the following components:   Troponin I (High Sensitivity) 762 (*)    All other components within normal limits  TROPONIN I (HIGH SENSITIVITY) - Abnormal; Notable for the following components:   Troponin I (High Sensitivity) 790 (*)    All other components within normal limits  RESP PANEL BY RT-PCR (FLU A&B, COVID) ARPGX2  CULTURE, BLOOD (ROUTINE X 2)  CULTURE, BLOOD (ROUTINE X 2)  HEPATIC FUNCTION PANEL  MAGNESIUM  LACTIC ACID, PLASMA  LACTIC ACID, PLASMA  TROPONIN I (HIGH SENSITIVITY)    EKG EKG Interpretation  Date/Time:  Sunday July 07 2021 04:33:42 EST Ventricular Rate:  102 PR Interval:  186 QRS Duration: 182 QT Interval:  458 QTC Calculation: 596 R Axis:   -48 Text Interpretation: Sinus rhythm Left axis deviation Left bundle branch block Abnormal ECG No significant change was found Confirmed by Addison Lank [id number]) on 07/07/2021 4:36:49 AM  Radiology DG Chest 2 View  Result Date: 07/07/2021 CLINICAL DATA:  40 year old male with cough and hemoptysis. EXAM: CHEST - 2 VIEW COMPARISON:  Portable chest 05/30/2021 and earlier. FINDINGS: AP and lateral views of the chest. Stable cardiomegaly and mediastinal contours. Visualized tracheal air column is within normal limits. Unresolved over this series of exams this year is asymmetric patchy and indistinct pulmonary opacity which is generally worse in the right lung. Similar appearance on the August CT scout view 03/30/2021, with broad differential diagnosis at that time. However, portable chest x-ray 04/03/2031 the lungs were relatively clear. Currently no superimposed pneumothorax, pleural effusion or consolidation. No acute osseous abnormality identified. Negative visible bowel gas. IMPRESSION: Cardiomegaly with recurrent asymmetric confluent but indistinct pulmonary opacity, seen on multiple exams this year including Chest CT  03/30/2021. Favor Recurrent Asymmetric Pulmonary Edema over the other differential considerations listed on that CT report. Electronically Signed   By: Genevie Ann M.D.   On: 07/07/2021 05:03   CT Angio Chest PE W and/or Wo Contrast  Result Date: 07/07/2021 CLINICAL DATA:  PE suspected, chest pain, shortness of breath, elevated blood pressures, cocaine use EXAM: CT ANGIOGRAPHY CHEST WITH CONTRAST TECHNIQUE: Multidetector CT imaging of the chest was performed using the standard protocol during bolus administration of intravenous contrast. Multiplanar CT image reconstructions and MIPs were obtained to evaluate the vascular anatomy. CONTRAST:  29mL OMNIPAQUE IOHEXOL 350 MG/ML SOLN COMPARISON:  03/30/2021 FINDINGS: Cardiovascular: Examination for pulmonary embolism is limited by poor contrast bolus and breath motion artifact. Within this limitation, no evidence of central or lobar pulmonary embolism. Cardiomegaly. No pericardial effusion. Gross enlargement of the main pulmonary artery, measuring up to 4.0 cm in caliber. Mediastinum/Nodes: No enlarged mediastinal, hilar, or axillary lymph nodes. Thyroid gland, trachea, and esophagus demonstrate no significant findings. Lungs/Pleura: Heterogeneous consolidative and ground-glass airspace opacity throughout the right lung, primarily in the right upper lobe, similar in appearance although fluctuant in comparison to prior examination dated 03/30/2021 (series 7, image 30). No pleural effusion or pneumothorax. Upper Abdomen: No acute abnormality. Musculoskeletal: No chest wall abnormality. No acute or significant osseous findings. Review of the MIP images confirms the above findings. IMPRESSION: 1. Examination for pulmonary embolism is limited by poor contrast bolus and breath motion artifact. Within this limitation, no evidence of central or lobar pulmonary embolism. The segmental and more distal pulmonary arteries are inadequately assessed to exclude embolism. 2.  Heterogeneous consolidative and ground-glass airspace opacity throughout the right lung, primarily in the right upper lobe, similar in appearance although fluctuant in comparison to prior examination dated 03/30/2021. Findings are most consistent with multifocal infection. 3. Cardiomegaly. 4. Gross enlargement of the main pulmonary artery, as can be seen in pulmonary hypertension. Electronically Signed   By: Delanna Ahmadi M.D.   On: 07/07/2021 09:53    Procedures Procedures   CRITICAL CARE Performed by: Gwenyth Allegra Maleah Rabago Total critical care time: 35 minutes Critical care time was exclusive of separately billable procedures and treating other patients. Critical care was necessary to treat or prevent imminent or life-threatening deterioration. Critical care was time spent personally by me on the following activities: development of treatment plan with patient and/or surrogate as well as nursing, discussions with consultants, evaluation of patient's response to treatment, examination of patient, obtaining history from patient or surrogate, ordering and performing treatments and interventions, ordering and review of laboratory studies, ordering and review of radiographic studies, pulse oximetry and re-evaluation of patient's condition.   Medications Ordered in ED Medications  nitroGLYCERIN 50 mg in dextrose 5 % 250 mL (0.2 mg/mL) infusion (35 mcg/min Intravenous Infusion Verify 07/07/21 1105)  cefTRIAXone (ROCEPHIN) 1 g in sodium chloride 0.9 % 100 mL IVPB (has no administration in time range)  azithromycin (ZITHROMAX) 500 mg in sodium chloride 0.9 % 250 mL IVPB (has no administration in time range)  iohexol (OMNIPAQUE) 350 MG/ML injection 65 mL (65 mLs Intravenous Contrast Given 07/07/21 1761)    ED Course  I have reviewed the triage vital signs and the nursing notes.  Pertinent labs & imaging results that were available during my care of the patient were reviewed by me and considered in my  medical decision making (see chart for details).    MDM Rules/Calculators/A&P                           Gerald [name] is a 40 y.o. male with a past medical history significant for atrial fibrillation on Xarelto, hypertension, CKD, CHF, previous NSTEMI, and polysubstance abuse  who presents with chest pain, shortness of breath, cough with hemoptysis, fatigue, and edema.  According to patient, he has been out of his diuretic for the last for 5 days and did use cocaine on Friday, 3 days ago while partying.  He says that for the last few hours he has been having chest pain that feels like a tightness and pressure and is associated with new hemoptysis.  He says this feels similar to when he had significant fluid overload and had to be admitted several months ago.  He suspects it is due to fluid and cocaine use.  He denies trauma.  He reports he has had some fatigue and chills but has not had a fever at home when he is checked.  He does say a clients recently had a cold several days ago that he was exposed to.  He denies any constipation, diarrhea, or urinary changes.  He did reports that his legs and abdomen do feel more distended likely due to missing the diuretics.  He reports the pain does not go to his back.  During his last admission, patient was having runs of wide-complex tachycardia and arrhythmias.  EKG on arrival appears similar to prior.  On exam, lungs have both rhonchi and rales in the bases.  Chest was nontender.  Abdomen was nontender.  He does have intact pulses in all extremities but legs are edematous.  Back was nontender.  No focal neurologic deficits initially.  Patient was tachycardic on arrival and blood pressure was variable.  Patient was seen in the hallway by me initially.  Clinically I am concerned about either fluid overload related to stopping Lasix and drug use, a hypertensive crisis, or even a pneumonia given the exposure to someone with URI symptoms and chills.  Patient had a  chest x-ray which showed some asymmetric edema similar to prior but did not see convincing evidence of pneumonia.  There was no comment of a more widened mediastinum.  Clinically have low suspicion for dissection at this time.  With his hemoptysis, chest pain, shortness of breath, tachycardia, I do feel need to rule out pulmonary embolism.  We will get a CT PE study.  Troponin initially elevated in triage and rising up to 790.  This is similar to what has been in the past.  We will continue to trend.  BNP is elevated at 1300 although this is improved from his previous admission it is still elevated.  Mild leukocytosis present as well as mild anemia.  Otherwise creatinine is slightly improved from prior.  As he admits to cocaine use several days ago, do not feel UDS would be very additive.  Will get the CT PE study and monitor the patient's blood pressure.  If blood pressure remains elevated on recheck, anticipate medications to treat.  There could also be a component of pulmonary edema related to his hypertension.  Due to the episodes of wide-complex tachycardia during his last visit with the same problem, anticipate he will need admission again.  9:19 AM Repeat check of blood pressure shows he is now over 741 systolic.  I spoke with cardiology given his rising troponin, chest pain, hemoptysis, and pulmonary edema and they do feel that a nitro drip is the best course of action for the patient.  Nitro drip ordered and hopefully patient can be placed into an exam room where he can be more closely monitored given his history of wide-complex tachycardia and arrhythmias.  Cardiology will see him in consultation but  feels he will need a medicine admission.  Anticipate admission after work-up is completed.  CT scan does not show evidence of pulmonary embolism.  It does show some concern for multifocal pneumonia.  Given the cough, white count, tachypnea, temperature of 99, chills, and the imaging results, will  treat with antibiotics although clinically I am more concerned about fluid overload related to his elevated blood pressures.  Patient is on the nitro drip and blood pressure started to improve and his symptoms are also improving.  We will continue to trend troponin but will get lactic acid, blood cultures, and we will call for admission.  Spoke to critical care briefly who was helping with another patient in the department and given the patient's otherwise well appearance they suspected he would likely be safe for stepdown despite the nitro drip.  They requested we call medicine first but will be happy to weigh in if needed otherwise.  Patient resting in the hallway now.  Will call medicine for admit.    Final Clinical Impression(s) / ED Diagnoses Final diagnoses:  Community acquired pneumonia, unspecified laterality  Hypervolemia, unspecified hypervolemia type  Hemoptysis  Chest pain, unspecified type  Elevated troponin  Hypertensive emergency     Clinical Impression: 1. Community acquired pneumonia, unspecified laterality   2. Hypervolemia, unspecified hypervolemia type   3. Hemoptysis   4. Chest pain, unspecified type   5. Elevated troponin   6. Hypertensive emergency     Disposition: Admit  This note was prepared with assistance of Dragon voice recognition software. Occasional wrong-word or sound-a-like substitutions may have occurred due to the inherent limitations of voice recognition software.     Yuka Lallier, Gwenyth Allegra, MD 07/07/21 [phone]

## 2021-07-07 NOTE — Progress Notes (Signed)
Patient admitted to room 3E25 via stretcher from ED.  NTG gtt infusing at 40 mcg.  Alert and oriented x 4.  Introduced to [employer] school teacher and routine.  Bed in low position / brakes locked.

## 2021-07-07 NOTE — Consult Note (Addendum)
Cardiology Consultation:   Patient ID: Gerald [name] MRN: [id number]; DOB: 1981-01-06  Admit date: 07/07/2021 Date of Consult: 07/07/2021  PCP:  Patient, [name] (Inactive)   [facility] Providers Cardiologist:  Ida Rogue, MD   {    Patient Profile:   Gerald [name] is a 40 y.o. 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 Lorin Mercy.  Multiple admission for CHF exacerbation in setting of medication noncompliance.  Continue to use tobacco and cocaine.  Most recent admission, October 2022.  Discharge which 129.4 kg.  He was diuresed total 8 L.  Did not follow-up in [facility] heart failure clinic after discharge.  History of Present Illness:   Gerald [name] presented early morning with complaint of chest discomfort, shortness of breath, cough and hemoptysis.  Coughing of blood started 2 hours ago prior to arrival.  Reports running out of all of his medication 4 days ago.  Continues to smoke tobacco.  Has abdominal swelling, extremity edema, orthopnea and PND.  Not able to provide reason for missed appointment or medication noncompliance.  Last use of cocaine on Friday.  Ported fever and chills yesterday.  CT angio of chest without evidence of pulmonary embolism but concerning for multifocal infection.  Pressure significantly elevated.  He is started on nitroglycerin drip.  Potassium 3.3 Serum creatinine 1.98 Troponin 760>>790 BNP 1323 Hemoglobin 11.7  Respiratory panel negative for influenza and COVID   Past Medical History:  Diagnosis Date   Atrial flutter, paroxysmal ([name])    Chronic combined systolic and diastolic CHF (congestive heart failure) ([name])    Chronic kidney disease, stage III  (moderate) ([name])    Dysrhythmia    brief episode afib, cardioverted did not return   Hypertension    Polysubstance abuse [facility])     Past Surgical History:  Procedure Laterality Date   KNEE SURGERY Right    LEFT HEART CATH AND CORONARY ANGIOGRAPHY N/A 12/21/2020   Procedure: LEFT HEART CATH AND CORONARY ANGIOGRAPHY;  Surgeon: Martinique, Peter M, MD;  Location: [facility];  Service: Cardiovascular;  Laterality: N/A;   TEE WITHOUT CARDIOVERSION N/A 04/04/2019   Procedure: TRANSESOPHAGEAL ECHOCARDIOGRAM (TEE);  Surgeon: [name], MD;  Location: [facility];  Service: Cardiovascular;  Laterality: N/A;     Inpatient Medications: Scheduled Meds:  Continuous Infusions:  azithromycin     nitroGLYCERIN 40 mcg/min (07/07/21 1203)   PRN Meds:   Allergies:   No Known Allergies  Social History:   Social History   Socioeconomic History   Marital status: Single    Spouse name: Not on file   Number of children: Not on file   Years of education: Not on file   Highest education level: Not on file  Occupational History   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    Drug use: Yes  Types: Cocaine, Marijuana    Comment: last THC use 4 days ago, cocaine 3 days ago   Sexual activity: Not on file  Other Topics Concern   Not on file  Social History Narrative   Not on file   Social Determinants of Health   Financial Resource Strain: Medium Risk   Difficulty of Paying Living Expenses: Somewhat hard  Food Insecurity: No Food Insecurity   Worried About Running Out of Food in the Last Year: Never true   Ran Out of Food in the Last Year: Never true  Transportation Needs: No Transportation  Needs   Lack of Transportation (Medical): No   Lack of Transportation (Non-Medical): No  Physical Activity: Not on file  Stress: Not on file  Social Connections: Not on file  Intimate Partner Violence: Not on file    Family History:   Family History  Problem Relation Age of Onset   Hypertension Father    Heart disease Paternal Grandfather      ROS:  Please see the history of present illness.  All other ROS reviewed and negative.     Physical Exam/Data:   Vitals:   07/07/21 1100 07/07/21 1105 07/07/21 1110 07/07/21 1115  BP: (!) 178/123 (!) 183/130 (!) 184/134 (!) 188/126  Pulse: 93 90 93 96  Resp: (!) 30 (!) 23 (!) 25 (!) 23  Temp:      TempSrc:      SpO2: 96% 97% 95% 97%  Weight:      Height:        Intake/Output Summary (Last 24 hours) at 07/07/2021 1212 Last data filed at 07/07/2021 1203 Gross per 24 hour  Intake 90.73 ml  Output 400 ml  Net -309.27 ml   Last 3 Weights 07/07/2021 06/03/2021 06/02/2021  Weight (lbs) 300 lb 285 lb 3.2 oz 287 lb 0.6 oz  Weight (kg) 136.079 kg 129.366 kg 130.2 kg     Body mass index is 41.84 kg/m.  General:  Well nourished, well developed, in no acute distress HEENT: normal Neck: + JVD Vascular: No carotid bruits; Distal pulses 2+ bilaterally Cardiac:  normal S1, S2; RRR; no murmur  Lungs:  diminished breath sound Abd: soft, nontender, no hepatomegaly  Ext:1+  edema Musculoskeletal:  No deformities, BUE and BLE strength normal and equal Skin: warm and dry  Neuro:  CNs 2-12 intact, no focal abnormalities noted Psych:  Normal affect   EKG:  The EKG was personally reviewed and demonstrates: Sinus tachycardia, chronic left bundle Kahlee Metivier block Telemetry:  Telemetry was personally reviewed and demonstrates:  not on telemetry   Relevant CV Studies:  Echo 01/20/2021  1. Left ventricular ejection fraction, by estimation, is 30 to 35%. The  left ventricle has moderately decreased function. The left ventricle  demonstrates global  hypokinesis. The left ventricular internal cavity size  was mildly dilated. There is severe  left ventricular hypertrophy. Left ventricular diastolic parameters are  indeterminate.   2. Right ventricular systolic function is normal. The right ventricular  size is normal. Moderately increased right ventricular wall thickness.  Tricuspid regurgitation signal is inadequate for assessing PA pressure.   3. Left atrial size was severely dilated.   4. Right atrial size was mildly dilated.   5. The mitral valve is normal in structure. Trivial mitral valve  regurgitation.   6. The aortic valve is tricuspid. Aortic valve regurgitation is trivial.  No aortic stenosis is present.   7. The inferior vena cava is dilated in size with >50% respiratory  variability, suggesting right atrial pressure of 8 mmHg.    LEFT HEART CATH AND CORONARY ANGIOGRAPHY  12/21/2020   Conclusion    LV end diastolic pressure is moderately elevated.   1. Very large and normal coronary arteries 2. Moderately elevated LVEDP 24 mm Hg   Plan: focus on BP control and optimization of CHF therapy. Laboratory Data:  High Sensitivity Troponin:   Recent Labs  Lab 07/07/21 0430 07/07/21 0645  TROPONINIHS 762* 790*     Chemistry Recent Labs  Lab 07/07/21 0430  NA 139  K 3.3*  CL 107  CO2 21*  GLUCOSE 113*  BUN 33*  CREATININE 1.98*  CALCIUM 9.1  GFRNONAA 43*  ANIONGAP 11    Hematology Recent Labs  Lab 07/07/21 0430  WBC 11.5*  RBC 5.51  HGB 11.7*  HCT 39.8  MCV 72.2*  MCH 21.2*  MCHC 29.4*  RDW 19.3*  PLT 206    BNP Recent Labs  Lab 07/07/21 0431  BNP 1,323.8*    DDimer No results for input(s): DDIMER in the last 168 hours.   Radiology/Studies:  DG Chest 2 View  Result Date: 07/07/2021 CLINICAL DATA:  40 year old male with cough and hemoptysis. EXAM: CHEST - 2 VIEW COMPARISON:  Portable chest 05/30/2021 and earlier. FINDINGS: AP and lateral views of the chest. Stable cardiomegaly and  mediastinal contours. Visualized tracheal air column is within normal limits. Unresolved over this series of exams this year is asymmetric patchy and indistinct pulmonary opacity which is generally worse in the right lung. Similar appearance on the August CT scout view 03/30/2021, with broad differential diagnosis at that time. However, portable chest x-ray 04/03/2031 the lungs were relatively clear. Currently no superimposed pneumothorax, pleural effusion or consolidation. No acute osseous abnormality identified. Negative visible bowel gas. IMPRESSION: Cardiomegaly with recurrent asymmetric confluent but indistinct pulmonary opacity, seen on multiple exams this year including Chest CT 03/30/2021. Favor Recurrent Asymmetric Pulmonary Edema over the other differential considerations listed on that CT report. Electronically Signed   By: Genevie Ann M.D.   On: 07/07/2021 05:03   CT Angio Chest PE W and/or Wo Contrast  Result Date: 07/07/2021 CLINICAL DATA:  PE suspected, chest pain, shortness of breath, elevated blood pressures, cocaine use EXAM: CT ANGIOGRAPHY CHEST WITH CONTRAST TECHNIQUE: Multidetector CT imaging of the chest was performed using the standard protocol during bolus administration of intravenous contrast. Multiplanar CT image reconstructions and MIPs were obtained to evaluate the vascular anatomy. CONTRAST:  38mL OMNIPAQUE IOHEXOL 350 MG/ML SOLN COMPARISON:  03/30/2021 FINDINGS: Cardiovascular: Examination for pulmonary embolism is limited by poor contrast bolus and breath motion artifact. Within this limitation, no evidence of central or lobar pulmonary embolism. Cardiomegaly. No pericardial effusion. Gross enlargement of the main pulmonary artery, measuring up to 4.0 cm in caliber. Mediastinum/Nodes: No enlarged mediastinal, hilar, or axillary lymph nodes. Thyroid gland, trachea, and esophagus demonstrate no significant findings. Lungs/Pleura: Heterogeneous consolidative and ground-glass airspace  opacity throughout the right lung, primarily in the right upper lobe, similar in appearance although fluctuant in comparison to prior examination dated 03/30/2021 (series 7, image 30). No pleural effusion or pneumothorax. Upper Abdomen: No acute abnormality. Musculoskeletal: No chest wall abnormality. No acute or significant osseous findings. Review of the MIP images confirms the above findings. IMPRESSION: 1. Examination for pulmonary embolism is limited by poor contrast bolus and breath motion artifact. Within this limitation, no evidence of central or lobar pulmonary embolism. The segmental and more distal pulmonary arteries are inadequately assessed to exclude embolism.  2. Heterogeneous consolidative and ground-glass airspace opacity throughout the right lung, primarily in the right upper lobe, similar in appearance although fluctuant in comparison to prior examination dated 03/30/2021. Findings are most consistent with multifocal infection. 3. Cardiomegaly. 4. Gross enlargement of the main pulmonary artery, as can be seen in pulmonary hypertension. Electronically Signed   By: Delanna Ahmadi M.D.   On: 07/07/2021 09:53     Assessment and Plan:   Acute on chronic combined CHF/nonischemic cardiomyopathy -Cardiac catheterization 12/2020 showed no evidence of CAD -Echocardiogram 01/2021 with LV function of 30 to 35% and intermediate diastolic parameter.  Severe LVH.  Normal RV function. -Patient with history of multiple CHF exacerbation in setting of medication noncompliance -Same story this admission, reports ran out of medication 4 days ago -Evidence of volume overload by exam -BNP elevated -Patient will need IV diuresis. Will give lasix IV 60mg  x 1 for now with Kdur.  -Resume home heart failure medication -Not ACE/ARB/Entresto due to CKD -Strict INO, daily weight and daily renal function check  2. Elevated troponin/ Chest pain  -Troponin 760>>790 -Likely demand ischemia in setting of volume  overload and hypertensive urgency  3.  Hypertensive urgency -Continue nitroglycerin drip -Resume prior medication  4.  Atrial flutter -He is on Xarelto 20 mg daily for anticoagulation.  However now presenting with hemoptysis in setting of possible multifocal infection. -Likely to hold anticoagulation for few days.    Dr. Harl Bowie to see. Anticoagulation and diuretic regimen per MD.    Risk Assessment/Risk Scores:   New York Heart Association (NYHA) Functional Class NYHA Class III  CHA2DS2-VASc Score = 2   This indicates a 2.2% annual risk of stroke. The patient's score is based upon: CHF History: 1 HTN History: 1 Diabetes History: 0 Stroke History: 0 Vascular Disease History: 0 Age Score: 0 Gender Score: 0    For questions or updates, please contact [place] Please consult [email or web address] for contact info under    Jarrett Soho, PA  07/07/2021 12:12 PM   Attending note Patient seen and discussed with PA Bhagat, 40 yo male history of aflutter, polysubtance abuse, chronic sysotlic HF/NICM 11/84 echo LVEF 30-35%, CKD, chronic LBBB admitted with hemoptysis and SOB. Cardiology consulted for evidence of acute on chronic systolic HF and elevated troponin. Patient with history of medication noncompliance and admissions, reports today he he ran out of meds. Also reports recent cocaine use.   Most recent admission, October 2022.  Discharge which 129.4 kg.  He was diuresed total 8 L.  ER vitals p 92 bp 203/132 K 3.3 Cr 1.98 BUN 33 WBC 11.5 Hgb 11.7 Plt 206 BNP 1323 Lactic acid 0.8 Trop 762-->790-->697 CXR asymmetric pulm edema EKG SR, LBBB 12/2020 normal cath  Acute on chronic sysotlic HF in setting of recurrent medication compliance, cocaine use, severe HTN on presentation. Chronically elevated trop in this setting, cath 12/2020 without signicant disease. Resume home HF meds, start lasix 40mg  bid   Maryann Alar

## 2021-07-07 NOTE — H&P (Signed)
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 male with medical history significant of chronic combined CHF; stage 3b CKD; afib s/p DCCV on Xarelto; HTN; polysubstance abuse; and morbid obesity presenting with .  He was last admitted from 10/13-17 for acute on chronic combined CHF associated with medication noncompliance and ongoing tobacco and cocaine use.   He was doing better but did not follow up as directed and ran out of medications "because I was hard headed".  He used cocaine Friday night.  He has had severe cough with hemoptysis.  He currently feels better in that regard but has a headache.  He recognizes the importance of compliance and not using cocaine.    ED Course: Similar to prior - out of Lasix and used cocaine on Friday.  Back with hemoptysis, volume overload.  Has chills/cough - CTA without evidence of PE, could be infection vs. Edema.  Started on NTG drip with improved symptoms.  Review of Systems: As per HPI; otherwise review of systems reviewed and negative.   Ambulatory Status:  Ambulates without assistance  COVID Vaccine Status:   Complete  Past Medical History:  Diagnosis Date   Atrial flutter, paroxysmal ([name])    Chronic combined systolic and diastolic CHF (congestive heart failure) ([name])    Chronic kidney disease, stage III (moderate) ([name])    Dysrhythmia    brief episode afib, cardioverted did not return   Hypertension    Polysubstance abuse [facility])     Past Surgical History:  Procedure Laterality Date   KNEE SURGERY Right    LEFT HEART CATH AND CORONARY ANGIOGRAPHY N/A 12/21/2020   Procedure: LEFT HEART CATH AND CORONARY ANGIOGRAPHY;  Surgeon: Martinique, Peter M, MD;  Location: [facility];  Service: Cardiovascular;  Laterality: N/A;   TEE WITHOUT CARDIOVERSION N/A 04/04/2019   Procedure: TRANSESOPHAGEAL ECHOCARDIOGRAM (TEE);  Surgeon: [name], MD;  Location: [facility];  Service: Cardiovascular;  Laterality: N/A;    Social History   Socioeconomic History   Marital status: Single    Spouse name: Not on file   Number of children: Not on file   Years of education: Not on file   Highest education level: Not on file  Occupational History   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    Drug use: Yes    Types: Cocaine, Marijuana    Comment: last THC use 4 days ago, cocaine 3 days ago   Sexual activity: Not on file  Other Topics Concern   Not on file  Social History Narrative   Not on file   Social Determinants of Health   Financial Resource Strain: Medium Risk   Difficulty of Paying Living Expenses: Somewhat hard  Food Insecurity: No Food Insecurity   Worried About Running Out of Food in the Last Year: Never true  Ran Out of Food in the Last Year: Never true  Transportation Needs: No Transportation Needs   Lack of Transportation (Medical): No   Lack of Transportation (Non-Medical): No  Physical Activity: Not on file  Stress: Not on file  Social Connections: Not on file  Intimate Partner Violence: Not on file    No Known Allergies  Family History  Problem Relation Age of Onset   Hypertension Father    Heart disease Paternal Grandfather     Prior to Admission medications   Medication Sig Start Date End Date Taking? Authorizing Provider  atorvastatin (LIPITOR) 40 MG tablet Take 1 tablet (40 mg total) by mouth daily. 06/03/21 07/03/21  Barb Merino, MD  Caffeine-Magnesium Salicylate (DIUREX PO) Take 1 tablet by mouth daily as needed (bloating).    [provider]  carvedilol (COREG) 25 MG tablet Take 1 tablet (25 mg total) by mouth 2 (two) times daily with a meal. 06/03/21 07/03/21  Barb Merino, MD  empagliflozin (JARDIANCE) 10 MG TABS tablet Take 1 tablet (10 mg total) by mouth daily. 06/04/21   O'Neal, Cassie Freer, MD  hydrALAZINE (APRESOLINE) 100 MG tablet Take 1 tablet (100 mg total) by mouth every 8 (eight) hours. 06/03/21 07/03/21  Barb Merino, MD  isosorbide mononitrate (IMDUR) 60 MG 24 hr tablet Take 1 tablet (60 mg total) by mouth daily. 06/04/21 07/04/21  Barb Merino, MD  potassium chloride (KLOR-CON) 10 MEQ tablet Take 1 tablet (10 mEq total) by mouth daily. Patient taking differently: Take 10 mEq by mouth 2 (two) times daily. 04/02/21 05/30/21  Arrien, Jimmy Picket, MD  rivaroxaban (XARELTO) 20 MG TABS tablet Take 1 tablet (20 mg total) by mouth daily with supper. 04/02/21 05/30/21  Arrien, Jimmy Picket, MD  spironolactone (ALDACTONE) 25 MG tablet Take 1 tablet (25 mg total) by mouth daily for 30 doses. 04/02/21 05/30/21  Arrien, Jimmy Picket, MD  torsemide (DEMADEX) 20 MG tablet Take 1 tablet (20 mg total) by mouth daily. 06/04/21 07/04/21  Barb Merino, MD    Physical Exam: Vitals:   07/07/21 1100 07/07/21 1105 07/07/21 1110 07/07/21 1115  BP: (!) 178/123 (!) 183/130 (!) 184/134 (!) 188/126  Pulse: 93 90 93 96  Resp: (!) 30 (!) 23 (!) 25 (!) 23  Temp:      TempSrc:      SpO2: 96% 97% 95% 97%  Weight:      Height:         General:  Appears calm and comfortable and is in NAD Eyes:   EOMI, normal lids, iris ENT:  grossly normal hearing, lips & tongue, mmm Neck:  no LAD, masses or thyromegaly Cardiovascular:  RR with tachycardia, no m/r/g. No LE edema.  Respiratory:   CTA bilaterally with no wheezes/rales/rhonchi.  Mildly increased respiratory effort. Abdomen:  soft, NT, ND Skin:  no  rash or induration seen on limited exam Musculoskeletal:  grossly normal tone BUE/BLE, good ROM, no bony abnormality Lower extremity:  No LE edema.  Limited foot exam with no ulcerations.  2+ distal pulses. Psychiatric:  blunted mood and affect, speech fluent and appropriate, AOx3 Neurologic:  CN 2-12 grossly intact, moves all extremities in coordinated fashion    Radiological Exams on Admission: Independently reviewed - see discussion in A/P where applicable  DG Chest 2 View  Result Date: 07/07/2021 CLINICAL DATA:  40 year old male with cough and hemoptysis. EXAM: CHEST - 2 VIEW COMPARISON:  Portable chest 05/30/2021 and earlier. FINDINGS: AP and lateral views of the chest. Stable  cardiomegaly and mediastinal contours. Visualized tracheal air column is within normal limits. Unresolved over this series of exams this year is asymmetric patchy and indistinct pulmonary opacity which is generally worse in the right lung. Similar appearance on the August CT scout view 03/30/2021, with broad differential diagnosis at that time. However, portable chest x-ray 04/03/2031 the lungs were relatively clear. Currently no superimposed pneumothorax, pleural effusion or consolidation. No acute osseous abnormality identified. Negative visible bowel gas. IMPRESSION: Cardiomegaly with recurrent asymmetric confluent but indistinct pulmonary opacity, seen on multiple exams this year including Chest CT 03/30/2021. Favor Recurrent Asymmetric Pulmonary Edema over the other differential considerations listed on that CT report. Electronically Signed   By: Genevie Ann M.D.   On: 07/07/2021 05:03   CT Angio Chest PE W and/or Wo Contrast  Result Date: 07/07/2021 CLINICAL DATA:  PE suspected, chest pain, shortness of breath, elevated blood pressures, cocaine use EXAM: CT ANGIOGRAPHY CHEST WITH CONTRAST TECHNIQUE: Multidetector CT imaging of the chest was performed using the standard protocol during bolus administration of  intravenous contrast. Multiplanar CT image reconstructions and MIPs were obtained to evaluate the vascular anatomy. CONTRAST:  7mL OMNIPAQUE IOHEXOL 350 MG/ML SOLN COMPARISON:  03/30/2021 FINDINGS: Cardiovascular: Examination for pulmonary embolism is limited by poor contrast bolus and breath motion artifact. Within this limitation, no evidence of central or lobar pulmonary embolism. Cardiomegaly. No pericardial effusion. Gross enlargement of the main pulmonary artery, measuring up to 4.0 cm in caliber. Mediastinum/Nodes: No enlarged mediastinal, hilar, or axillary lymph nodes. Thyroid gland, trachea, and esophagus demonstrate no significant findings. Lungs/Pleura: Heterogeneous consolidative and ground-glass airspace opacity throughout the right lung, primarily in the right upper lobe, similar in appearance although fluctuant in comparison to prior examination dated 03/30/2021 (series 7, image 30). No pleural effusion or pneumothorax. Upper Abdomen: No acute abnormality. Musculoskeletal: No chest wall abnormality. No acute or significant osseous findings. Review of the MIP images confirms the above findings. IMPRESSION: 1. Examination for pulmonary embolism is limited by poor contrast bolus and breath motion artifact. Within this limitation, no evidence of central or lobar pulmonary embolism. The segmental and more distal pulmonary arteries are inadequately assessed to exclude embolism. 2. Heterogeneous consolidative and ground-glass airspace opacity throughout the right lung, primarily in the right upper lobe, similar in appearance although fluctuant in comparison to prior examination dated 03/30/2021. Findings are most consistent with multifocal infection. 3. Cardiomegaly. 4. Gross enlargement of the main pulmonary artery, as can be seen in pulmonary hypertension. Electronically Signed   By: Delanna Ahmadi M.D.   On: 07/07/2021 09:53    EKG: Independently reviewed.  NSR with rate 102; LBBB, LVH,  NSCSLT   Labs on Admission: I have personally reviewed the available labs and imaging studies at the time of the admission.  Pertinent labs:   K+ 3.3 Glucose 113 BUN 33/Creatinine 1.98/GFR 43 - stable BNP 1323.8 HS troponin 762, 790 WBC 11.5 Hgb 11.7 COVID/flu negative   Assessment/Plan Principal Problem:   Acute on chronic combined systolic and diastolic CHF (congestive heart failure) ([name]) Active Problems:   Essential hypertension   PAF (paroxysmal atrial fibrillation) ([name])   CKD (chronic kidney disease), stage III ([name])   Polysubstance abuse ([name])   Obesity, Class III, BMI 40-49.9 (morbid obesity) ([name])   Hemoptysis   Acute on chronic combined CHF -Patient presenting with hemoptysis and SOB -He recently ran out of medications again -He did have evidence of end organ failure, with elevated troponin, BNP -The patient was started on a NTG  drip in the ER due to cards recommendation -CXR consistent with likely asymmetric edema; underlying infiltrate is a consideration so will continue Rocephin/Azithro for now -Markedly elevated BNP  -Will admit to progressive care for ongoing monitoring and treament -Elevated troponin; will request cardiology consultation -Will hold Jardiance, Coreg - has not been taking and acutely decompensated, but will need to resume when appropriate -Will resume home hydralazine and add prn IV hydralazine -Will continue NTG drip and so hold Imdur -CHF order set utilized via the [place] -Was given Lasix 60 mg x 1 in ER and will repeat with 40 mg IV BID -Continue [place] O2 prn for now -Resume spironolactone   Hemoptysis -Likely due to CHF in the setting of cocaine use -However, he does have possible underlying infiltrate -Abx coverage continued    Afib -Appears to no longer appear to be taking Amiodarone -Rate controlled with Coreg -Hold Xarelto in the setting of hemoptysis   HTN -As noted above, continue hydralazine, aldactone, Coreg -Will also  add prn hydralazine   HLD -Resume Lipitor   Stage 3b CKD -Appears to be stable at this time -Recheck BMP in AM -If he continues to have renal decline, he will soon need nephrology outpatient to prepare for eventual HD   Polysubstance abuse -Cessation encouraged; this should be encouraged on an ongoing basis -He acknowledges ongoing marijuana and cocaine use and acknowledges that he will die if he continues to use   Tobacco dependence -Encourage cessation.    -Patch ordered   Obesity -Body mass index is 41.84 kg/m.  -Weight loss should be encouraged -Outpatient PCP/bariatric medicine/bariatric surgery f/u encouraged     Note: This patient has been tested and is negative for the novel coronavirus COVID-19. The patient has been fully vaccinated against COVID-19.   Level of care: Progressive DVT prophylaxis: SCDs Code Status:  Full - confirmed with patient Family Communication: None present Disposition Plan:  The patient is from: home  Anticipated d/c is to: home without [facility] services   Anticipated d/c date will depend on clinical response to treatment, but possibly as early as tomorrow if she has excellent response to treatment  Patient is currently: acutely ill Consults called: Cardiology,  Heart failure navigator; [place] team Admission status:  Admit - It is my clinical opinion that admission to INPATIENT is reasonable and necessary because of the expectation that this patient will require hospital care that crosses at least 2 midnights to treat this condition based on the medical complexity of the problems presented.  Given the aforementioned information, the predictability of an adverse outcome is felt to be significant.    Karmen Bongo MD [facility]   How to contact the [facility] Attending or Consulting provider [place] or covering provider during after hours [place], for this patient?  Check the care team in [facility] and look for a) attending/consulting [facility] provider listed  and b) the [facility] team listed Log into [email or web address] and use [place]'s universal password to access. If you do not have the password, please contact the hospital operator. Locate the [facility] provider you are looking for under [facility] and page to a number that you can be directly reached. If you still have difficulty reaching the provider, please page the [facility] (Director on Call) for the Hospitalists listed on amion for assistance.   07/07/2021, 1:32 PM

## 2021-07-07 NOTE — ED Notes (Signed)
Patient transported to CT 

## 2021-07-07 NOTE — ED Provider Notes (Signed)
Emergency Medicine Provider Triage Evaluation Note  Gerald [name] , a 40 y.o. male  was evaluated in triage.  Pt complains of chest pain and shortness of breath.  Reports associated cough and hemoptysis.  States he has had the hemoptysis for the past 2 hours.  States that he has had this before when he has had pneumonia.  He reports being out of his fluid pill for the past several days.  Reports some abdominal swelling.  Denies any fever.  Review of Systems  Positive: Hemoptysis, shortness of breath Negative: Fever, chills  Physical Exam  There were no vitals taken for this visit. Gen:   Awake, no distress   Resp:  Normal effort  MSK:   Moves extremities without difficulty  Other:    Medical Decision Making  Medically screening exam initiated at 4:19 AM.  Appropriate orders placed.  Gerald [name] was informed that the remainder of the evaluation will be completed by another provider, this initial triage assessment does not replace that evaluation, and the importance of remaining in the ED until their evaluation is complete.  hemoptysis   Montine Circle, PA-C 07/07/21 0420    Fatima Blank, MD 07/08/21 [phone]

## 2021-07-07 NOTE — ED Triage Notes (Signed)
Patient coughing up blood for the last two hours.  Ran out of Lasix two days ago.  Patient with chest pain and hurts more when he coughs.  He has had abdominal distention for the last two days.  Patient was here one month ago for same and admitted.

## 2021-07-08 ENCOUNTER — Encounter (HOSPITAL_COMMUNITY): Payer: Self-pay | Admitting: Internal Medicine

## 2021-07-08 DIAGNOSIS — I10 Essential (primary) hypertension: Secondary | ICD-10-CM

## 2021-07-08 DIAGNOSIS — R778 Other specified abnormalities of plasma proteins: Secondary | ICD-10-CM

## 2021-07-08 DIAGNOSIS — F1910 Other psychoactive substance abuse, uncomplicated: Secondary | ICD-10-CM

## 2021-07-08 DIAGNOSIS — I480 Paroxysmal atrial fibrillation: Secondary | ICD-10-CM

## 2021-07-08 DIAGNOSIS — R042 Hemoptysis: Secondary | ICD-10-CM

## 2021-07-08 DIAGNOSIS — G2581 Restless legs syndrome: Secondary | ICD-10-CM

## 2021-07-08 LAB — BASIC METABOLIC PANEL
Anion gap: 13 (ref 5–15)
BUN: 29 mg/dL — ABNORMAL HIGH (ref 6–20)
CO2: 24 mmol/L (ref 22–32)
Calcium: 8.8 mg/dL — ABNORMAL LOW (ref 8.9–10.3)
Chloride: 103 mmol/L (ref 98–111)
Creatinine, Ser: 1.97 mg/dL — ABNORMAL HIGH (ref 0.61–1.24)
GFR, Estimated: 43 mL/min — ABNORMAL LOW (ref >60)
Glucose, Bld: 103 mg/dL — ABNORMAL HIGH (ref 70–99)
Potassium: 3 mmol/L — ABNORMAL LOW (ref 3.5–5.1)
Sodium: 140 mmol/L (ref 135–145)

## 2021-07-08 LAB — CBC
HCT: 37.5 % — ABNORMAL LOW (ref 39.0–52.0)
Hemoglobin: 11.2 g/dL — ABNORMAL LOW (ref 13.0–17.0)
MCH: 21.3 pg — ABNORMAL LOW (ref 26.0–34.0)
MCHC: 29.9 g/dL — ABNORMAL LOW (ref 30.0–36.0)
MCV: 71.3 fL — ABNORMAL LOW (ref 80.0–100.0)
Platelets: 183 10*3/uL (ref 150–400)
RBC: 5.26 MIL/uL (ref 4.22–5.81)
RDW: 18.6 % — ABNORMAL HIGH (ref 11.5–15.5)
WBC: 7.6 10*3/uL (ref 4.0–10.5)
nRBC: 0 % (ref 0.0–0.2)

## 2021-07-08 LAB — IRON AND TIBC
Iron: 29 ug/dL — ABNORMAL LOW (ref 45–182)
Saturation Ratios: 9 % — ABNORMAL LOW (ref 17.9–39.5)
TIBC: 339 ug/dL (ref 250–450)
UIBC: 310 ug/dL

## 2021-07-08 LAB — FERRITIN: Ferritin: 42 ng/mL (ref 24–336)

## 2021-07-08 MED ORDER — SPIRONOLACTONE 25 MG PO TABS
50.0000 mg | ORAL_TABLET | Freq: Every day | ORAL | Status: DC
  Administered 2021-07-09 – 2021-07-11 (×3): 50 mg via ORAL
  Filled 2021-07-08 (×3): qty 2

## 2021-07-08 MED ORDER — SPIRONOLACTONE 25 MG PO TABS
25.0000 mg | ORAL_TABLET | Freq: Once | ORAL | Status: AC
  Administered 2021-07-08: 25 mg via ORAL
  Filled 2021-07-08: qty 1

## 2021-07-08 MED ORDER — POTASSIUM CHLORIDE CRYS ER 20 MEQ PO TBCR
40.0000 meq | EXTENDED_RELEASE_TABLET | Freq: Once | ORAL | Status: AC
  Administered 2021-07-08: 40 meq via ORAL
  Filled 2021-07-08: qty 2

## 2021-07-08 MED ORDER — ISOSORBIDE MONONITRATE ER 60 MG PO TB24
60.0000 mg | ORAL_TABLET | Freq: Every day | ORAL | Status: DC
  Administered 2021-07-08 – 2021-07-09 (×2): 60 mg via ORAL
  Filled 2021-07-08 (×2): qty 1

## 2021-07-08 NOTE — Progress Notes (Signed)
Progress Note    Gerald [name]   SLH:[id number]  DOB: February 26, 1981  DOA: 07/07/2021     1 Date of Service: 07/08/2021   Clinical Course Gerald [name] is a 40 y.o. male with medical history significant of chronic combined CHF; stage 3b CKD; afib s/p DCCV on Xarelto; HTN; polysubstance abuse; and morbid obesity presenting with .  He was last admitted from 10/13-17 for acute on chronic combined CHF associated with medication noncompliance and ongoing tobacco and cocaine use.   He was doing better but did not follow up as directed and ran out of medications "because I was hard headed".  He used cocaine Friday night.  He has had severe cough with hemoptysis.  He currently feels better in that regard but has a headache.  He recognizes the importance of compliance and not using cocaine.  Assessment and Plan * Acute on chronic combined systolic and diastolic CHF (congestive heart failure) ([name]) Patient presenting with hemoptysis and SOB -He recently ran out of medications again -He did have evidence of end organ failure, with elevated troponin, BNP -The patient was started on a NTG drip per cards -CXR consistent with likely asymmetric edema; underlying infiltrate is a consideration so will continue Rocephin/Azithro for now -Markedly elevated BNP  -cards consult -Will resume home meds and add prn IV hydralazine -Was given Lasix 60 mg x 1 in ER and will repeat with 40 mg IV BID -wean O2 as able -Resume spironolactone  Hemoptysis -Likely due to CHF in the setting of cocaine use -However, he does have possible underlying infiltrate -Abx coverage continued  Obesity, Class III, BMI 40-49.9 (morbid obesity) ([place]) Estimated body mass index is 41.41 kg/m as calculated from the following:   Height as of this encounter: 5\' 11"  (1.803 m).   Weight as of this encounter: 134.7 kg.  Polysubstance abuse ([place]) -He acknowledges ongoing marijuana and cocaine use and acknowledges that he will die if  he continues to use -social work consult  CKD (chronic kidney disease), stage III ([place]) -Appears to be stable at this time -Recheck BMP in AM -If he continues to have renal decline, he will soon need nephrology outpatient to prepare for eventual HD  Hypokalemia -replete agressively  PAF (paroxysmal atrial fibrillation) ([facility]) Appears to no longer appear to be taking Amiodarone -Rate controlled with Coreg -Hold Xarelto in the setting of hemoptysis  Essential hypertension As noted above, continue hydralazine, aldactone, Coreg -Will also add prn hydralazine     Subjective:  C/o feeling like he has to move his legs to stop the throbbing   Objective Vitals:   07/08/21 0334 07/08/21 0500 07/08/21 0600 07/08/21 0823  BP: (!) 142/90 (!) 155/106 (!) 135/93 (!) 176/130  Pulse: 84 78 74 84  Resp: 16   16  Temp: 98.5 F (36.9 C)   98.6 F (37 C)  TempSrc: Oral   Oral  SpO2: 96% 98% 96% 97%  Weight: 134.7 kg     Height:       134.7 kg     Exam  General: Appearance:    Severely obese male in no acute distress     Lungs:     On Belt, diminished, respirations unlabored  Heart:    Normal heart rate. irregular   MS:   All extremities are intact.    Neurologic:   Awake, alert, oriented x 3     Labs / Other Information K continues to be low   Disposition Plan:  Status is: Inpatient  Remains inpatient appropriate because: needs further management of cardiac issues       Time spent: 35 minutes [facility] 07/08/2021, 9:24 AM

## 2021-07-08 NOTE — Assessment & Plan Note (Addendum)
-  Likely due to CHF in the setting of cocaine use -However, he does have possible underlying infiltrate -Abx coverage continued -seem resolved

## 2021-07-08 NOTE — Assessment & Plan Note (Addendum)
Appears to no longer appear to be taking Amiodarone -Rate controlled with Coreg - Xarelto held in the setting of hemoptysis-- seems stable and can prob be resumed if ok with cards

## 2021-07-08 NOTE — Assessment & Plan Note (Signed)
As noted above, continuehydralazine, aldactone, Coreg -Will also add prn hydralazine

## 2021-07-08 NOTE — Progress Notes (Signed)
Progress Note  Patient Name: Gerald [name] Date of Encounter: 07/08/2021  [facility] Cardiologist: Ida Rogue, MD   Subjective   Feeling better.  Breathing not quite at baseline but much better.   Inpatient Medications    Scheduled Meds:  aspirin EC  81 mg Oral Daily   atorvastatin  40 mg Oral Daily   carvedilol  25 mg Oral BID WC   docusate sodium  100 mg Oral BID   furosemide  40 mg Intravenous BID   hydrALAZINE  100 mg Oral Q8H   nicotine  14 mg Transdermal Daily   potassium chloride  40 mEq Oral Once   sodium chloride flush  3 mL Intravenous Q12H   spironolactone  25 mg Oral Daily   Continuous Infusions:  azithromycin     cefTRIAXone (ROCEPHIN)  IV     nitroGLYCERIN 50 mcg/min (07/08/21 0629)   PRN Meds: acetaminophen **OR** acetaminophen, bisacodyl, hydrALAZINE, morphine injection, ondansetron **OR** ondansetron (ZOFRAN) IV, oxyCODONE, polyethylene glycol   Vital Signs    Vitals:   07/08/21 0500 07/08/21 0600 07/08/21 0823 07/08/21 1000  BP: (!) 155/106 (!) 135/93 (!) 176/130 (!) 138/100  Pulse: 78 74 84 74  Resp:   16 15  Temp:   98.6 F (37 C)   TempSrc:   Oral   SpO2: 98% 96% 97% 97%  Weight:      Height:        Intake/Output Summary (Last 24 hours) at 07/08/2021 1028 Last data filed at 07/08/2021 1026 Gross per 24 hour  Intake 1907.76 ml  Output 5950 ml  Net -4042.24 ml   Last 3 Weights 07/08/2021 07/07/2021 07/07/2021  Weight (lbs) 296 lb 14.4 oz 300 lb 14.9 oz 300 lb  Weight (kg) 134.673 kg 136.5 kg 136.079 kg      Telemetry    Sinus rhythm.  PVCs - Personally Reviewed  ECG    N/a  Physical Exam   VS:  BP (!) 138/100 (BP Location: Left Wrist)   Pulse 74   Temp 98.6 F (37 C) (Oral)   Resp 15   Ht 5\' 11"  (1.803 m)   Wt 134.7 kg   SpO2 97%   BMI 41.41 kg/m  , BMI Body mass index is 41.41 kg/m. GENERAL:  Well appearing HEENT: Pupils equal round and reactive, fundi not visualized, oral mucosa unremarkable NECK:   JVP 2 cm above clavicle at 45 degree.  waveform within normal limits, carotid upstroke brisk and symmetric, no bruits, no thyromegaly LUNGS: Mild crackles HEART:  RRR.  PMI not displaced or sustained,S1 and S2 within normal limits, no S3, no S4, no clicks, no rubs, no murmurs ABD:  Flat, positive bowel sounds normal in frequency in pitch, no bruits, no rebound, no guarding, no midline pulsatile mass, no hepatomegaly, no splenomegaly EXT:  2 plus pulses throughout, trace LE edema, no cyanosis no clubbing SKIN:  No rashes no nodules NEURO:  Cranial nerves [name] through XII grossly intact, motor grossly intact throughout PSYCH:  Cognitively intact, oriented to person place and time   Labs    High Sensitivity Troponin:   Recent Labs  Lab 07/07/21 0430 07/07/21 0645 07/07/21 1126 07/07/21 1512 07/07/21 1759  TROPONINIHS 762* 790* 697* 705* 784*     Chemistry Recent Labs  Lab 07/07/21 0430 07/07/21 0900 07/08/21 0152  NA 139  --  140  K 3.3*  --  3.0*  CL 107  --  103  CO2 21*  --  24  GLUCOSE 113*  --  103*  BUN 33*  --  29*  CREATININE 1.98*  --  1.97*  CALCIUM 9.1  --  8.8*  MG  --  2.1  --   PROT  --  7.1  --   ALBUMIN  --  3.6  --   AST  --  31  --   ALT  --  38  --   ALKPHOS  --  68  --   BILITOT  --  1.1  --   GFRNONAA 43*  --  43*  ANIONGAP 11  --  13    Lipids No results for input(s): CHOL, TRIG, HDL, LABVLDL, LDLCALC, CHOLHDL in the last 168 hours.  Hematology Recent Labs  Lab 07/07/21 0430 07/08/21 0152  WBC 11.5* 7.6  RBC 5.51 5.26  HGB 11.7* 11.2*  HCT 39.8 37.5*  MCV 72.2* 71.3*  MCH 21.2* 21.3*  MCHC 29.4* 29.9*  RDW 19.3* 18.6*  PLT 206 183   Thyroid No results for input(s): TSH, FREET4 in the last 168 hours.  BNP Recent Labs  Lab 07/07/21 0431  BNP 1,323.8*    DDimer No results for input(s): DDIMER in the last 168 hours.   Radiology    DG Chest 2 View  Result Date: 07/07/2021 CLINICAL DATA:  40 year old male with cough and  hemoptysis. EXAM: CHEST - 2 VIEW COMPARISON:  Portable chest 05/30/2021 and earlier. FINDINGS: AP and lateral views of the chest. Stable cardiomegaly and mediastinal contours. Visualized tracheal air column is within normal limits. Unresolved over this series of exams this year is asymmetric patchy and indistinct pulmonary opacity which is generally worse in the right lung. Similar appearance on the August CT scout view 03/30/2021, with broad differential diagnosis at that time. However, portable chest x-ray 04/03/2031 the lungs were relatively clear. Currently no superimposed pneumothorax, pleural effusion or consolidation. No acute osseous abnormality identified. Negative visible bowel gas. IMPRESSION: Cardiomegaly with recurrent asymmetric confluent but indistinct pulmonary opacity, seen on multiple exams this year including Chest CT 03/30/2021. Favor Recurrent Asymmetric Pulmonary Edema over the other differential considerations listed on that CT report. Electronically Signed   By: Genevie Ann M.D.   On: 07/07/2021 05:03   CT Angio Chest PE W and/or Wo Contrast  Result Date: 07/07/2021 CLINICAL DATA:  PE suspected, chest pain, shortness of breath, elevated blood pressures, cocaine use EXAM: CT ANGIOGRAPHY CHEST WITH CONTRAST TECHNIQUE: Multidetector CT imaging of the chest was performed using the standard protocol during bolus administration of intravenous contrast. Multiplanar CT image reconstructions and MIPs were obtained to evaluate the vascular anatomy. CONTRAST:  43mL OMNIPAQUE IOHEXOL 350 MG/ML SOLN COMPARISON:  03/30/2021 FINDINGS: Cardiovascular: Examination for pulmonary embolism is limited by poor contrast bolus and breath motion artifact. Within this limitation, no evidence of central or lobar pulmonary embolism. Cardiomegaly. No pericardial effusion. Gross enlargement of the main pulmonary artery, measuring up to 4.0 cm in caliber. Mediastinum/Nodes: No enlarged mediastinal, hilar, or axillary  lymph nodes. Thyroid gland, trachea, and esophagus demonstrate no significant findings. Lungs/Pleura: Heterogeneous consolidative and ground-glass airspace opacity throughout the right lung, primarily in the right upper lobe, similar in appearance although fluctuant in comparison to prior examination dated 03/30/2021 (series 7, image 30). No pleural effusion or pneumothorax. Upper Abdomen: No acute abnormality. Musculoskeletal: No chest wall abnormality. No acute or significant osseous findings. Review of the MIP images confirms the above findings. IMPRESSION: 1. Examination for pulmonary embolism is limited by poor contrast bolus and  breath motion artifact. Within this limitation, no evidence of central or lobar pulmonary embolism. The segmental and more distal pulmonary arteries are inadequately assessed to exclude embolism. 2. Heterogeneous consolidative and ground-glass airspace opacity throughout the right lung, primarily in the right upper lobe, similar in appearance although fluctuant in comparison to prior examination dated 03/30/2021. Findings are most consistent with multifocal infection. 3. Cardiomegaly. 4. Gross enlargement of the main pulmonary artery, as can be seen in pulmonary hypertension. Electronically Signed   By: Delanna Ahmadi M.D.   On: 07/07/2021 09:53    Cardiac Studies   Echo 01/20/2021  1. Left ventricular ejection fraction, by estimation, is 30 to 35%. The  left ventricle has moderately decreased function. The left ventricle  demonstrates global hypokinesis. The left ventricular internal cavity size  was mildly dilated. There is severe  left ventricular hypertrophy. Left ventricular diastolic parameters are  indeterminate.   2. Right ventricular systolic function is normal. The right ventricular  size is normal. Moderately increased right ventricular wall thickness.  Tricuspid regurgitation signal is inadequate for assessing PA pressure.   3. Left atrial size was severely  dilated.   4. Right atrial size was mildly dilated.   5. The mitral valve is normal in structure. Trivial mitral valve  regurgitation.   6. The aortic valve is tricuspid. Aortic valve regurgitation is trivial.  No aortic stenosis is present.   7. The inferior vena cava is dilated in size with >50% respiratory  variability, suggesting right atrial pressure of 8 mmHg.     Patient Profile     40 y.o. male with chronic systolic and diastolic heart failure, PAF, hypertension, morbid obesity, polysubstance abuse, CKD and non-adherence admitted with elevated troponin and acute on chronic heart failure.    Assessment & Plan    # Acute on chronic systolic and diastolic heart failure: # Non-adherence: # Hypertension:  Missed TOC appointment and has multiple admissions in the setting of not taking medications as prescribed.  He ran out of his medication 4 days prior to admission.  Continue carvedilol, hydralazine, and spironolactone.  Resume home Imdur and stop nitro drip.  He was net -3.2L yesterday, -4L since admission.  Continue carvedilol, hydralazine and transition nitro from drip to Imdur.  No renal artery stenosis on Doppler 03/2019. Continue diuresis.  He is not on Entresto 2/2 CKD.   # Elevated troponin: Demand ischemia.  Coronaries were normal on cath 12/2020.  Stopping aspirin and statin given that he struggle with adherence and has no ASCVD.   # PAF: Xarelto on hold in setting of hemoptysis. CT concerning for viral infection. H/h stable today.  Restart prior to discharge.  # Polysubstance abuse:  Chronic tobacco and cocaine abuse.   # Hyperlipidemia:  Recommend stopping atorvastatin and aspirin to simplify his regimen given his adherence issues.  No CAD on cath or chest CTs.      For questions or updates, please contact [facility] Please consult [email or web address] for contact info under        Signed, Skeet Latch, MD  07/08/2021, 10:28 AM

## 2021-07-08 NOTE — Assessment & Plan Note (Signed)
-  Appears to bestable at this time -Recheck BMP in AM -If he continues to have renal decline, he will soon need nephrology outpatient to prepare for eventual HD

## 2021-07-08 NOTE — Progress Notes (Signed)
Heart Failure Nurse Navigator Progress Note  PCP: Patient, [name] (Inactive) PCP-Cardiologist: [place] Admission Diagnosis: A/C CHF Admitted from: home alone  Presentation:   Gerald [name] presented 11/20 with hemoptysis, SOB. Pt resting in bed on room air, NTG gtt infusing. Pt interactive with interview process. Endorses using cocaine when he wants to party--last use 11/18. Pt drinks 5-6 shots of liquor on the weekends, smokes marijuana 3x week--last use 11/18. Pt smokes ~0.75 PPD x 28 years, daily use--ready to quit. Pt states he ran out of his medications Thursday, hasn't paid his [employer] this month so he was not able to afford his medications.  Pt drives reliable vehicle, bills caught up, working cell phone at bedside.  Educated on benefits of HV TOC, pt agreeable. Reviewed diet/fluid modifications and importance of medication compliance.   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 Marijuana last use 11/18, several times a week.   Sexual activity: Yes    Partners: Female    Birth control/protection: Condom  Other Topics Concern   Not on file  Social History Narrative   Not on file   Social Determinants of Health   Financial Resource Strain: High Risk   Difficulty of Paying Living Expenses: Hard  Food Insecurity: No Food Insecurity   Worried About Charity fundraiser in the Last Year: Never true   Ran Out of Food in the Last Year: Never true  Transportation Needs: No Transportation Needs   Lack of Transportation (Medical): No   Lack of Transportation (Non-Medical): No  Physical Activity: Not on file  Stress: Not on file  Social Connections: Not on file    High Risk Criteria for Readmission and/or Poor Patient Outcomes: Heart failure hospital admissions (last 6 months): 4  No Show rate: 32% Difficult social situation: yes Demonstrates medication adherence: NO Primary Language: English Literacy level: able to read/write and comprehend. States he needs glasses.  Education Assessment and Provision:  Detailed education and instructions provided on heart failure disease management including the following:  Signs and symptoms of Heart Failure When to call the physician Importance of daily weights Low sodium diet Fluid restriction Medication management Anticipated future  follow-up appointments  Patient education given on each of the above topics.  Patient acknowledges understanding via teach back method and acceptance of all instructions.  Education Materials:  "Living Better With Heart Failure" Booklet, HF zone tool, & Daily Weight Tracker Tool.  Patient has scale at home: yes Patient has pill box at home: yes   Barriers of Care:   -financial strain -medication compliance -polysubstance use -smoking -dietary/fluid noncompliance  Considerations/Referrals:    Referral made to Heart Failure Pharmacist Stewardship: yes, appreciated Referral made to Heart Failure CSW/NCM TOC: yes, appreciated Referral made to Heart & Vascular TOC clinic: yes, 12/1 @ 11AM  Items for Follow-up on DC/TOC: -optimize -medication cost -cocaine cessation -smoking cessation -dietary/fluid compliance -new cardiology, pt moved to [place] from [place] ([place])   Pricilla Holm, MSN, RN Heart Failure Nurse Navigator [phone]

## 2021-07-08 NOTE — Assessment & Plan Note (Addendum)
Estimated body mass index is 41.33 kg/m as calculated from the following:   Height as of this encounter: 5\' 11"  (1.803 m).   Weight as of this encounter: 134.4 kg.

## 2021-07-08 NOTE — Assessment & Plan Note (Addendum)
Patient presenting withhemoptysis and SOB -Herecently ran out of medications again -He did have evidence of end organ failure, withelevated troponin, BNP -CXR consistent withlikely asymmetric edema; underlying infiltrate is a consideration so will continue Rocephin/Azithro for 5 days -Markedlyelevated BNP  -cards consult -wean off nitro gtt -Willresume home meds andadd prnIVhydralazine - 40 mg IV BID -wean O2 as able -Resume spironolactone

## 2021-07-08 NOTE — Assessment & Plan Note (Addendum)
-  replete aggressively again

## 2021-07-08 NOTE — Assessment & Plan Note (Signed)
-  He acknowledges ongoing marijuana and cocaine use and acknowledges that he will die if he continues to use -social work consult

## 2021-07-09 ENCOUNTER — Other Ambulatory Visit (HOSPITAL_COMMUNITY): Payer: Self-pay

## 2021-07-09 DIAGNOSIS — J189 Pneumonia, unspecified organism: Secondary | ICD-10-CM

## 2021-07-09 LAB — BASIC METABOLIC PANEL
Anion gap: 8 (ref 5–15)
BUN: 29 mg/dL — ABNORMAL HIGH (ref 6–20)
CO2: 23 mmol/L (ref 22–32)
Calcium: 8.9 mg/dL (ref 8.9–10.3)
Chloride: 106 mmol/L (ref 98–111)
Creatinine, Ser: 1.93 mg/dL — ABNORMAL HIGH (ref 0.61–1.24)
GFR, Estimated: 44 mL/min — ABNORMAL LOW (ref >60)
Glucose, Bld: 112 mg/dL — ABNORMAL HIGH (ref 70–99)
Potassium: 3.2 mmol/L — ABNORMAL LOW (ref 3.5–5.1)
Sodium: 137 mmol/L (ref 135–145)

## 2021-07-09 LAB — CBC
HCT: 38.3 % — ABNORMAL LOW (ref 39.0–52.0)
Hemoglobin: 11.6 g/dL — ABNORMAL LOW (ref 13.0–17.0)
MCH: 21.5 pg — ABNORMAL LOW (ref 26.0–34.0)
MCHC: 30.3 g/dL (ref 30.0–36.0)
MCV: 71.1 fL — ABNORMAL LOW (ref 80.0–100.0)
Platelets: 188 10*3/uL (ref 150–400)
RBC: 5.39 MIL/uL (ref 4.22–5.81)
RDW: 19 % — ABNORMAL HIGH (ref 11.5–15.5)
WBC: 6.3 10*3/uL (ref 4.0–10.5)
nRBC: 0 % (ref 0.0–0.2)

## 2021-07-09 MED ORDER — AMLODIPINE BESYLATE 5 MG PO TABS
5.0000 mg | ORAL_TABLET | Freq: Every day | ORAL | Status: DC
  Administered 2021-07-09 – 2021-07-10 (×2): 5 mg via ORAL
  Filled 2021-07-09 (×2): qty 1

## 2021-07-09 MED ORDER — TORSEMIDE 20 MG PO TABS
20.0000 mg | ORAL_TABLET | Freq: Every day | ORAL | Status: DC
  Administered 2021-07-10: 20 mg via ORAL
  Filled 2021-07-09: qty 1

## 2021-07-09 MED ORDER — ISOSORBIDE MONONITRATE ER 60 MG PO TB24
120.0000 mg | ORAL_TABLET | Freq: Every day | ORAL | Status: DC
  Administered 2021-07-10 – 2021-07-11 (×2): 120 mg via ORAL
  Filled 2021-07-09 (×2): qty 2

## 2021-07-09 MED ORDER — POTASSIUM CHLORIDE CRYS ER 20 MEQ PO TBCR
40.0000 meq | EXTENDED_RELEASE_TABLET | Freq: Once | ORAL | Status: AC
  Administered 2021-07-09: 40 meq via ORAL
  Filled 2021-07-09: qty 2

## 2021-07-09 MED ORDER — ISOSORBIDE MONONITRATE ER 60 MG PO TB24
60.0000 mg | ORAL_TABLET | Freq: Once | ORAL | Status: AC
  Administered 2021-07-09: 60 mg via ORAL
  Filled 2021-07-09: qty 1

## 2021-07-09 MED ORDER — POTASSIUM CHLORIDE CRYS ER 20 MEQ PO TBCR
40.0000 meq | EXTENDED_RELEASE_TABLET | Freq: Two times a day (BID) | ORAL | Status: DC
  Administered 2021-07-09 – 2021-07-11 (×4): 40 meq via ORAL
  Filled 2021-07-09 (×5): qty 2

## 2021-07-09 MED ORDER — SODIUM CHLORIDE 0.9 % IV SOLN
510.0000 mg | Freq: Once | INTRAVENOUS | Status: AC
  Administered 2021-07-09: 510 mg via INTRAVENOUS
  Filled 2021-07-09: qty 17

## 2021-07-09 NOTE — Progress Notes (Signed)
Mobility Specialist Progress Note:   07/09/21 1556  Mobility  Activity Ambulated to bathroom  Level of Assistance Modified independent, requires aide device or extra time  Assistive Device None  Distance Ambulated (ft) 30 ft  Mobility Out of bed for toileting  Mobility Response Tolerated well  Mobility performed by Mobility specialist  $Mobility charge 1 Mobility   [facility] Primary Phone [phone] Secondary Phone [phone]

## 2021-07-09 NOTE — Assessment & Plan Note (Signed)
Iron levels low-- replace and see if he has improvement

## 2021-07-09 NOTE — Progress Notes (Signed)
Progress Note  Patient Name: Gerald [name] Date of Encounter: 07/09/2021  [facility] Cardiologist: Ida Rogue, MD   Subjective   Breathing ok, but both lower legs hurt, no muscle cramps, they just ache  Inpatient Medications    Scheduled Meds:  carvedilol  25 mg Oral BID WC   docusate sodium  100 mg Oral BID   furosemide  40 mg Intravenous BID   hydrALAZINE  100 mg Oral Q8H   isosorbide mononitrate  60 mg Oral Daily   nicotine  14 mg Transdermal Daily   sodium chloride flush  3 mL Intravenous Q12H   spironolactone  50 mg Oral Daily   Continuous Infusions:  azithromycin Stopped (07/08/21 1403)   cefTRIAXone (ROCEPHIN)  IV Stopped (07/08/21 1234)   ferumoxytol     nitroGLYCERIN 35 mcg/min (07/09/21 1012)   PRN Meds: acetaminophen **OR** acetaminophen, bisacodyl, hydrALAZINE, morphine injection, ondansetron **OR** ondansetron (ZOFRAN) IV, oxyCODONE, polyethylene glycol   Vital Signs    Vitals:   07/09/21 0338 07/09/21 0800 07/09/21 0904 07/09/21 1003  BP: (!) 161/111 (!) 160/92 (!) 165/126 139/79  Pulse: 74 84    Resp: 18 17    Temp: 98.1 F (36.7 C) 98.4 F (36.9 C)    TempSrc: Oral Oral    SpO2: 100% 95%    Weight: 134.4 kg     Height:        Intake/Output Summary (Last 24 hours) at 07/09/2021 1053 Last data filed at 07/09/2021 1014 Gross per 24 hour  Intake 1694.4 ml  Output 3550 ml  Net -1855.6 ml   Last 3 Weights 07/09/2021 07/08/2021 07/07/2021  Weight (lbs) 296 lb 4.8 oz 296 lb 14.4 oz 300 lb 14.9 oz  Weight (kg) 134.4 kg 134.673 kg 136.5 kg      Telemetry    SR, PVCs - Personally Reviewed  ECG    N/a  Physical Exam   VS:  BP 139/79 (BP Location: Right Arm)   Pulse 84   Temp 98.4 F (36.9 C) (Oral)   Resp 17   Ht 5\' 11"  (1.803 m)   Wt 134.4 kg   SpO2 95%   BMI 41.33 kg/m  , BMI Body mass index is 41.33 kg/m. GEN: No acute distress.   Neck: No JVD seen Cardiac: RRR, no murmur, no rubs, or gallops. 4/4 extremity  pulses intact Respiratory: diminished to auscultation bilaterally with rales in the bases. GI: Soft, nontender, non-distended  MS: No edema; No deformity. No injury to legs Neuro:  Nonfocal  Psych: Normal affect    Labs    High Sensitivity Troponin:   Recent Labs  Lab 07/07/21 0430 07/07/21 0645 07/07/21 1126 07/07/21 1512 07/07/21 1759  TROPONINIHS 762* 790* 697* 705* 784*     Chemistry Recent Labs  Lab 07/07/21 0430 07/07/21 0900 07/08/21 0152 07/09/21 0252  NA 139  --  140 137  K 3.3*  --  3.0* 3.2*  CL 107  --  103 106  CO2 21*  --  24 23  GLUCOSE 113*  --  103* 112*  BUN 33*  --  29* 29*  CREATININE 1.98*  --  1.97* 1.93*  CALCIUM 9.1  --  8.8* 8.9  MG  --  2.1  --   --   PROT  --  7.1  --   --   ALBUMIN  --  3.6  --   --   AST  --  31  --   --  ALT  --  38  --   --   ALKPHOS  --  68  --   --   BILITOT  --  1.1  --   --   GFRNONAA 43*  --  43* 44*  ANIONGAP 11  --  13 8    Lipids No results for input(s): CHOL, TRIG, HDL, LABVLDL, LDLCALC, CHOLHDL in the last 168 hours.  Hematology Recent Labs  Lab 07/07/21 0430 07/08/21 0152 07/09/21 0252  WBC 11.5* 7.6 6.3  RBC 5.51 5.26 5.39  HGB 11.7* 11.2* 11.6*  HCT 39.8 37.5* 38.3*  MCV 72.2* 71.3* 71.1*  MCH 21.2* 21.3* 21.5*  MCHC 29.4* 29.9* 30.3  RDW 19.3* 18.6* 19.0*  PLT 206 183 188   Thyroid No results for input(s): TSH, FREET4 in the last 168 hours.  BNP Recent Labs  Lab 07/07/21 0431  BNP 1,323.8*    DDimer No results for input(s): DDIMER in the last 168 hours.  Drugs of Abuse     Component Value Date/Time   LABOPIA NONE DETECTED 01/20/2021 1208   COCAINSCRNUR NONE DETECTED 01/20/2021 1208   COCAINSCRNUR NONE DETECTED 04/01/2019 0733   LABBENZ NONE DETECTED 01/20/2021 1208   AMPHETMU NONE DETECTED 01/20/2021 1208   THCU POSITIVE (A) 01/20/2021 1208   LABBARB NONE DETECTED 01/20/2021 1208    Lab Results  Component Value Date   CHOL 163 01/20/2021   HDL 44 01/20/2021   LDLCALC 107  (H) 01/20/2021   TRIG 61 01/20/2021   CHOLHDL 3.7 01/20/2021     Radiology    No results found.  Cardiac Studies   Echo 01/20/2021  1. Left ventricular ejection fraction, by estimation, is 30 to 35%. The  left ventricle has moderately decreased function. The left ventricle  demonstrates global hypokinesis. The left ventricular internal cavity size was mildly dilated. There is severe left ventricular hypertrophy. Left ventricular diastolic parameters are indeterminate.   2. Right ventricular systolic function is normal. The right ventricular  size is normal. Moderately increased right ventricular wall thickness.  Tricuspid regurgitation signal is inadequate for assessing PA pressure.   3. Left atrial size was severely dilated.   4. Right atrial size was mildly dilated.   5. The mitral valve is normal in structure. Trivial mitral valve  regurgitation.   6. The aortic valve is tricuspid. Aortic valve regurgitation is trivial.  No aortic stenosis is present.   7. The inferior vena cava is dilated in size with >50% respiratory  variability, suggesting right atrial pressure of 8 mmHg.     Patient Profile     40 y.o. male with chronic systolic and diastolic heart failure, PAF, hypertension, morbid obesity, polysubstance abuse, CKD and non-adherence admitted with elevated troponin and acute on chronic heart failure.    Assessment & Plan    # Acute on chronic systolic and diastolic heart failure: # Non-adherence: # Hypertension:  - missed mult appts - non-compliance w/ diet/meds - back on Imdur, will d/c IV ntg - Cr at 1.93 is at the low end of his recent labs - net -5.9 L since admit, -1.8 L yesterday - wt down 4 lbs since admit at 296 - volume status improved - no RAS - no ACE/ARB/Entresto 2nd CKD - close to euvolemic, may be able to change Lasix 40 mg IV bid to po Lasix in am  # Elevated troponin: - no CAD at cath - felt 2nd CHF - no further eval  # PAF: - Xarelto held  for  hemoptysis - CT w/ ?viral inf - H&H stable - discuss restarting Xarelto w/ MD  # Polysubstance abuse:  - Admits to cocaine and THC, tobacco - cessation encouraged  # Hyperlipidemia:  - HDL 107 - no CAD at cath - diet management only for now     For questions or updates, please contact [facility] Please consult [email or web address] for contact info under        Signed, Rosaria Ferries, PA-C  07/09/2021, 10:53 AM

## 2021-07-09 NOTE — Progress Notes (Addendum)
  Progress Note    Gerald [name]   MWN:[id number]  DOB: August 01, 1981  DOA: 07/07/2021     2 Date of Service: 07/09/2021   Clinical Course Gerald [name] is a 40 y.o. male with medical history significant of chronic combined CHF; stage 3b CKD; afib s/p DCCV on Xarelto; HTN; polysubstance abuse; and morbid obesity presenting with .  He was last admitted from 10/13-17 for acute on chronic combined CHF associated with medication noncompliance and ongoing tobacco and cocaine use.   He was doing better but did not follow up as directed and ran out of medications "because I was hard headed".   Slowly improving.   Main issue is Acute on chronic combined systolic and diastolic CHF due to cocaine abuse and medication non-compliance   Assessment and Plan * Acute on chronic combined systolic and diastolic CHF (congestive heart failure) ([name]) Patient presenting with hemoptysis and SOB -He recently ran out of medications again -He did have evidence of end organ failure, with elevated troponin, BNP -CXR consistent with likely asymmetric edema; underlying infiltrate is a consideration so will continue Rocephin/Azithro for 5 days -Markedly elevated BNP  -cards consult -wean off nitro gtt -Will resume home meds and add prn IV hydralazine - 40 mg IV BID -wean O2 as able -Resume spironolactone  Restless leg Iron levels low-- replace and see if he has improvement  Hemoptysis -Likely due to CHF in the setting of cocaine use -However, he does have possible underlying infiltrate -Abx coverage continued -seem resolved  Obesity, Class III, BMI 40-49.9 (morbid obesity) ([name]) Estimated body mass index is 41.33 kg/m as calculated from the following:   Height as of this encounter: 5\' 11"  (1.803 m).   Weight as of this encounter: 134.4 kg.  Polysubstance abuse ([place]) -He acknowledges ongoing marijuana and cocaine use and acknowledges that he will die if he continues to use -social work consult  CKD  (chronic kidney disease), stage III ([place]) -Appears to be stable at this time -Recheck BMP in AM -If he continues to have renal decline, he will soon need nephrology outpatient to prepare for eventual HD  Hypokalemia -replete aggressively again  PAF (paroxysmal atrial fibrillation) ([place]) Appears to no longer appear to be taking Amiodarone -Rate controlled with Coreg - Xarelto held in the setting of hemoptysis-- seems stable and can prob be resumed if ok with cards  Essential hypertension As noted above, continue hydralazine, aldactone, Coreg -Will also add prn hydralazine    Subjective:  Still c/o feeling like he needs to move his legs  Objective Vitals:   07/09/21 0800 07/09/21 0904 07/09/21 1003 07/09/21 1232  BP: (!) 160/92 (!) 165/126 139/79 (!) 145/93  Pulse: 84   75  Resp: 17   14  Temp: 98.4 F (36.9 C)   98.1 F (36.7 C)  TempSrc: Oral   Oral  SpO2: 95%   95%  Weight:      Height:       134.4 kg     Exam In bed, laying on side almost flat in bed Rrr +BS, soft, NT A+Ox3, pleasant and cooperative   Labs / Other Information K still low, Cr stable, Hgb stable   Disposition Plan: Status is: Inpatient  Remains inpatient appropriate because: needs to be weaned off nitro, changed to oral diuretics       Time spent: 35 minutes [facility] 07/09/2021, 12:45 PM

## 2021-07-10 ENCOUNTER — Other Ambulatory Visit (HOSPITAL_COMMUNITY): Payer: Self-pay

## 2021-07-10 ENCOUNTER — Inpatient Hospital Stay (HOSPITAL_COMMUNITY): Payer: Self-pay

## 2021-07-10 DIAGNOSIS — M7989 Other specified soft tissue disorders: Secondary | ICD-10-CM

## 2021-07-10 DIAGNOSIS — M79601 Pain in right arm: Secondary | ICD-10-CM

## 2021-07-10 DIAGNOSIS — E876 Hypokalemia: Secondary | ICD-10-CM

## 2021-07-10 LAB — BASIC METABOLIC PANEL
Anion gap: 9 (ref 5–15)
BUN: 24 mg/dL — ABNORMAL HIGH (ref 6–20)
CO2: 22 mmol/L (ref 22–32)
Calcium: 9.3 mg/dL (ref 8.9–10.3)
Chloride: 107 mmol/L (ref 98–111)
Creatinine, Ser: 1.84 mg/dL — ABNORMAL HIGH (ref 0.61–1.24)
GFR, Estimated: 47 mL/min — ABNORMAL LOW (ref >60)
Glucose, Bld: 134 mg/dL — ABNORMAL HIGH (ref 70–99)
Potassium: 3.8 mmol/L (ref 3.5–5.1)
Sodium: 138 mmol/L (ref 135–145)

## 2021-07-10 LAB — MAGNESIUM: Magnesium: 2.3 mg/dL (ref 1.7–2.4)

## 2021-07-10 LAB — PROCALCITONIN: Procalcitonin: 0.1 ng/mL

## 2021-07-10 MED ORDER — EMPAGLIFLOZIN 10 MG PO TABS
10.0000 mg | ORAL_TABLET | Freq: Every day | ORAL | 0 refills | Status: DC
  Filled 2021-07-10: qty 14, 14d supply, fill #0

## 2021-07-10 MED ORDER — ISOSORBIDE MONONITRATE ER 60 MG PO TB24
120.0000 mg | ORAL_TABLET | Freq: Every day | ORAL | 0 refills | Status: DC
  Filled 2021-07-10: qty 60, 30d supply, fill #0

## 2021-07-10 MED ORDER — RIVAROXABAN 20 MG PO TABS
20.0000 mg | ORAL_TABLET | Freq: Every day | ORAL | 0 refills | Status: DC
  Filled 2021-07-10: qty 30, 30d supply, fill #0

## 2021-07-10 MED ORDER — TORSEMIDE 20 MG PO TABS
20.0000 mg | ORAL_TABLET | Freq: Every day | ORAL | 0 refills | Status: DC
  Filled 2021-07-10: qty 30, 30d supply, fill #0

## 2021-07-10 MED ORDER — CARVEDILOL 25 MG PO TABS
25.0000 mg | ORAL_TABLET | Freq: Two times a day (BID) | ORAL | 0 refills | Status: DC
  Filled 2021-07-10: qty 60, 30d supply, fill #0

## 2021-07-10 MED ORDER — HYDRALAZINE HCL 100 MG PO TABS
100.0000 mg | ORAL_TABLET | Freq: Three times a day (TID) | ORAL | 0 refills | Status: DC
  Filled 2021-07-10: qty 90, 30d supply, fill #0

## 2021-07-10 MED ORDER — ATORVASTATIN CALCIUM 40 MG PO TABS
40.0000 mg | ORAL_TABLET | Freq: Every day | ORAL | 0 refills | Status: DC
  Filled 2021-07-10: qty 30, 30d supply, fill #0

## 2021-07-10 MED ORDER — POTASSIUM CHLORIDE CRYS ER 10 MEQ PO TBCR
10.0000 meq | EXTENDED_RELEASE_TABLET | Freq: Every day | ORAL | 0 refills | Status: DC
  Filled 2021-07-10: qty 30, 30d supply, fill #0

## 2021-07-10 MED ORDER — AMLODIPINE BESYLATE 5 MG PO TABS
5.0000 mg | ORAL_TABLET | Freq: Every day | ORAL | 0 refills | Status: DC
  Filled 2021-07-10: qty 30, 30d supply, fill #0

## 2021-07-10 MED ORDER — SPIRONOLACTONE 50 MG PO TABS
50.0000 mg | ORAL_TABLET | Freq: Every day | ORAL | 0 refills | Status: DC
  Filled 2021-07-10: qty 30, 30d supply, fill #0

## 2021-07-10 MED ORDER — RIVAROXABAN 20 MG PO TABS
20.0000 mg | ORAL_TABLET | Freq: Every day | ORAL | Status: DC
  Administered 2021-07-10: 20 mg via ORAL
  Filled 2021-07-10: qty 1

## 2021-07-10 NOTE — Progress Notes (Signed)
Mobility Specialist Progress Note:   07/10/21 1435  Mobility  Activity Ambulated in hall  Level of Assistance Modified independent, requires aide device or extra time  Assistive Device None  Distance Ambulated (ft) 1450 ft  Mobility Ambulated with assistance in hallway  Mobility Response Tolerated well  Mobility performed by Mobility specialist  $Mobility charge 1 Mobility   Pt received in bed wiling to participate in mobility. No complaints of pain and asymptomatic. Upon return to room pt requested to go to bathroom.   [facility] Public librarian Phone [phone] Secondary Phone [phone]

## 2021-07-10 NOTE — Evaluation (Incomplete)
Occupational Therapy Evaluation Patient Details Name: Gerald [name] MRN: [id number] DOB: 1981-03-02 Today's Date: 07/10/2021   History of Present Illness Gerald [name] is a 40 y.o. male presenting with acute CHF exacerbation due to cocaine abuse and medication non-compliance. Recent admission 10/13-10/17 due to same issue. PMH: atrial flutter, CHF, CKD, HTN, polysubstance abuse   Clinical Impression   ***      Recommendations for follow up therapy are one component of a multi-disciplinary discharge planning process, led by the attending physician.  Recommendations may be updated based on patient status, additional functional criteria and insurance authorization.   Follow Up Recommendations  No OT follow up    Assistance Recommended at Discharge None  Functional Status Assessment  Patient has not had a recent decline in their functional status  Equipment Recommendations  None recommended by OT    Recommendations for Other Services       Precautions / Restrictions Precautions Precautions: None Restrictions Weight Bearing Restrictions: No      Mobility Bed Mobility Overal bed mobility: Independent                  Transfers Overall transfer level: Independent Equipment used: None                      Balance Overall balance assessment: Independent                                         ADL either performed or assessed with clinical judgement   ADL Overall ADL's : Independent                                       General ADL Comments: able to don/doff socks, mobilize in room without AD, reach overhead without LOB and denies SOB or chest pain with activity     Vision Baseline Vision/History: 0 No visual deficits Ability to See in Adequate Light: 0 Adequate Patient Visual Report: No change from baseline Vision Assessment?: No apparent visual deficits     Perception     Praxis      Pertinent  Vitals/Pain Pain Assessment: No/denies pain     Hand Dominance Right   Extremity/Trunk Assessment Upper Extremity Assessment Upper Extremity Assessment: Overall WFL for tasks assessed   Lower Extremity Assessment Lower Extremity Assessment: Overall WFL for tasks assessed   Cervical / Trunk Assessment Cervical / Trunk Assessment: Normal   Communication Communication Communication: No difficulties   Cognition Arousal/Alertness: Awake/alert Behavior During Therapy: WFL for tasks assessed/performed Overall Cognitive Status: Within Functional Limits for tasks assessed                                       General Comments  Encouraged medication compliance, notifying MDs if prescriptions run out (reports medication from hospital last time said "no refills"), limiting fluid intake, limting salt intake and daily weights    Exercises     Shoulder Instructions      Home Living Family/patient expects to be discharged to:: Group home  Additional Comments: Reports it is a one level home with 5 steps to enter. Pt reports having his own bedroom/bathroom with walk in shower and regular height toilet      Prior Functioning/Environment Prior Level of Function : Independent/Modified Independent             Mobility Comments: no use of AD for mobility ADLs Comments: Pt reports Independence with ADLs, IADLs, driving and grocery shopping. Enjoys spending time with his teenage/young adult children, basketball        OT Problem List:        OT Treatment/Interventions:      OT Goals(Current goals can be found in the care plan section) Acute Rehab OT Goals Patient Stated Goal: get some rest OT Goal Formulation: All assessment and education complete, DC therapy  OT Frequency:     Barriers to D/C:            Co-evaluation              AM-PAC OT "6 Clicks" Daily Activity     Outcome Measure Help from another  person eating meals?: None Help from another person taking care of personal grooming?: None Help from another person toileting, which includes using toliet, bedpan, or urinal?: None Help from another person bathing (including washing, rinsing, drying)?: None Help from another person to put on and taking off regular upper body clothing?: None Help from another person to put on and taking off regular lower body clothing?: None 6 Click Score: 24   End of Session Nurse Communication: Mobility status  Activity Tolerance: Patient tolerated treatment well Patient left: in bed;with call bell/phone within reach  OT Visit Diagnosis: Other (comment) (decreased cardiopulmonary tolerance)                Time: 0950-1007 OT Time Calculation (min): 17 min Charges:  OT General Charges $OT Visit: 1 Visit OT Evaluation $OT Eval Low Complexity: 1 Low  {enter signature dotphrase here}  Layla Maw 07/10/2021, 10:20 AM

## 2021-07-10 NOTE — Progress Notes (Signed)
Upper extremity venous RT study completed.  Preliminary results relayed to Doristine Bosworth, MD via secure chat.  See CV Proc for preliminary results report.   Darlin Coco, RDMS, RVT

## 2021-07-10 NOTE — Progress Notes (Signed)
Progress Note  Patient Name: Gerald Gerald [name] Date of Encounter: 07/10/2021  [facility] Cardiologist: Ida Rogue, MD   Subjective   Feeling better.  Breathing near baseline.  He felt tired when walking but no shortness of breath.   Inpatient Medications    Scheduled Meds:  amLODipine  5 mg Oral Daily   carvedilol  25 mg Oral BID WC   docusate sodium  100 mg Oral BID   hydrALAZINE  100 mg Oral Q8H   isosorbide mononitrate  120 mg Oral Daily   nicotine  14 mg Transdermal Daily   potassium chloride  40 mEq Oral BID   sodium chloride flush  3 mL Intravenous Q12H   spironolactone  50 mg Oral Daily   torsemide  20 mg Oral Daily   Continuous Infusions:  azithromycin Stopped (07/09/21 1334)   cefTRIAXone (ROCEPHIN)  IV Stopped (07/09/21 1229)   PRN Meds: acetaminophen **OR** acetaminophen, bisacodyl, hydrALAZINE, morphine injection, ondansetron **OR** ondansetron (ZOFRAN) IV, oxyCODONE, polyethylene glycol   Vital Signs    Vitals:   07/09/21 2356 07/10/21 0445 07/10/21 0628 07/10/21 0828  BP: (!) 151/97 (!) 152/112 (!) 158/114 (!) 156/108  Pulse: 80 83  (!) 45  Resp: 20     Temp: 98.6 F (37 C) 98.6 F (37 C)  98.7 F (37.1 C)  TempSrc: Oral Oral  Oral  SpO2: 100% 99%  99%  Weight:  134.8 kg    Height:        Intake/Output Summary (Last 24 hours) at 07/10/2021 0934 Last data filed at 07/10/2021 0830 Gross per 24 hour  Intake 1557 ml  Output 2250 ml  Net -693 ml   Last 3 Weights 07/10/2021 07/09/2021 07/08/2021  Weight (lbs) 297 lb 3.2 oz 296 lb 4.8 oz 296 lb 14.4 oz  Weight (kg) 134.809 kg 134.4 kg 134.673 kg      Telemetry    Sinus rhythm.  Frequent PVCs - Personally Reviewed  ECG    N/a  Physical Exam   VS:  BP (!) 156/108 (BP Location: Left Arm)   Pulse (!) 45   Temp 98.7 F (37.1 C) (Oral)   Resp 20   Ht 5\' 11"  (1.803 m)   Wt 134.8 kg   SpO2 99%   BMI 41.45 kg/m  , BMI Body mass index is 41.45 kg/m. GENERAL:  Well  appearing HEENT: Pupils equal round and reactive, fundi not visualized, oral mucosa unremarkable NECK:  JVP 2 cm above clavicle at 45 degree.  waveform within normal limits, carotid upstroke brisk and symmetric, no bruits, no thyromegaly LUNGS: CTAB HEART:  RRR.  PMI not displaced or sustained,S1 and S2 within normal limits, no S3, no S4, no clicks, no rubs, no murmurs ABD:  Flat, positive bowel sounds normal in frequency in pitch, no bruits, no rebound, no guarding, no midline pulsatile mass, no hepatomegaly, no splenomegaly EXT:  2 plus pulses throughout, no LE edema, no cyanosis no clubbing SKIN:  No rashes no nodules NEURO:  Cranial nerves Gerald [name] through XII grossly intact, motor grossly intact throughout PSYCH:  Cognitively intact, oriented to person place and time   Labs    High Sensitivity Troponin:   Recent Labs  Lab 07/07/21 0430 07/07/21 0645 07/07/21 1126 07/07/21 1512 07/07/21 1759  TROPONINIHS 762* 790* 697* 705* 784*     Chemistry Recent Labs  Lab 07/07/21 0900 07/08/21 0152 07/09/21 0252 07/10/21 0112  NA  --  140 137 138  K  --  3.0* 3.2* 3.8  CL  --  103 106 107  CO2  --  24 23 22   GLUCOSE  --  103* 112* 134*  BUN  --  29* 29* 24*  CREATININE  --  1.97* 1.93* 1.84*  CALCIUM  --  8.8* 8.9 9.3  MG 2.1  --   --  2.3  PROT 7.1  --   --   --   ALBUMIN 3.6  --   --   --   AST 31  --   --   --   ALT 38  --   --   --   ALKPHOS 68  --   --   --   BILITOT 1.1  --   --   --   GFRNONAA  --  43* 44* 47*  ANIONGAP  --  13 8 9     Lipids No results for input(s): CHOL, TRIG, HDL, LABVLDL, LDLCALC, CHOLHDL in the last 168 hours.  Hematology Recent Labs  Lab 07/07/21 0430 07/08/21 0152 07/09/21 0252  WBC 11.5* 7.6 6.3  RBC 5.51 5.26 5.39  HGB 11.7* 11.2* 11.6*  HCT 39.8 37.5* 38.3*  MCV 72.2* 71.3* 71.1*  MCH 21.2* 21.3* 21.5*  MCHC 29.4* 29.9* 30.3  RDW 19.3* 18.6* 19.0*  PLT 206 183 188   Thyroid No results for input(s): TSH, FREET4 in the last 168 hours.   BNP Recent Labs  Lab 07/07/21 0431  BNP 1,323.8*    DDimer No results for input(s): DDIMER in the last 168 hours.   Radiology    No results found.  Cardiac Studies   Echo 01/20/2021  1. Left ventricular ejection fraction, by estimation, is 30 to 35%. The  left ventricle has moderately decreased function. The left ventricle  demonstrates global hypokinesis. The left ventricular internal cavity size  was mildly dilated. There is severe  left ventricular hypertrophy. Left ventricular diastolic parameters are  indeterminate.   2. Right ventricular systolic function is normal. The right ventricular  size is normal. Moderately increased right ventricular wall thickness.  Tricuspid regurgitation signal is inadequate for assessing PA pressure.   3. Left atrial size was severely dilated.   4. Right atrial size was mildly dilated.   5. The mitral valve is normal in structure. Trivial mitral valve  regurgitation.   6. The aortic valve is tricuspid. Aortic valve regurgitation is trivial.  No aortic stenosis is present.   7. The inferior vena cava is dilated in size with >50% respiratory  variability, suggesting right atrial pressure of 8 mmHg.     Patient Profile     40 y.o. male with chronic systolic and diastolic heart failure, PAF, hypertension, morbid obesity, polysubstance abuse, CKD and non-adherence admitted with elevated troponin and acute on chronic heart failure.    Assessment & Plan    # Acute on chronic systolic and diastolic heart failure: # Non-adherence: # Hypertensive urgency:  Missed TOC appointment and has multiple admissions in the setting of not taking medications as prescribed.  He ran out of his medication 4 days prior to admission.  Continue carvedilol, hydralazine, and spironolactone.  He was initially on nitroglycerin infusion but is now back on Imdur and the dose has been increased.  Amlodipine was added yesterday.  If his blood pressure remains elevated will  need to increase to 10 mg.  Continue carvedilol, hydralazine, and spironolactone, which was also increased.  He is not on Entresto due to chronic kidney disease.  He is resuming  his home torsemide today.  Recommend adding a low-dose ARB for both hypertension and chronic kidney disease if renal function is stable tomorrow.  # Elevated troponin: Demand ischemia.  Coronaries were normal on cath 12/2020.  Stopping aspirin and statin given that he struggle with adherence and has no ASCVD.   # PAF: Xarelto was held in setting of hemoptysis. CT concerning for viral infection. H/h remained stable.  Will resume Xarelto.  # Polysubstance abuse:  Chronic tobacco and cocaine abuse.   # Hyperlipidemia:  Recommend stopping atorvastatin and aspirin to simplify his regimen given his adherence issues.  No CAD on cath or chest CTs.      For questions or updates, please contact [place] Please consult [email or web address] for contact info under        Signed, Skeet Latch, MD  07/10/2021, 9:34 AM

## 2021-07-10 NOTE — Plan of Care (Signed)
  Problem: Clinical Measurements: Goal: Respiratory complications will improve Outcome: Progressing   Problem: Activity: Goal: Risk for activity intolerance will decrease Outcome: Progressing   Problem: Safety: Goal: Ability to remain free from injury will improve Outcome: Progressing   

## 2021-07-10 NOTE — Progress Notes (Signed)
PT Cancellation Note  Patient Details Name: ASHER TORPEY MRN: [id number] DOB: 03-31-1981   Cancelled Treatment:    Reason Eval/Treat Not Completed: PT screened, no needs identified, will sign off Per OT, pt independent with no acute PT needs. Will sign off. If needs change, please re-consult.   Lou Miner, DPT  Acute Rehabilitation Services  Pager: [phone] Office: [phone]    Rudean Hitt 07/10/2021, 10:17 AM

## 2021-07-10 NOTE — Progress Notes (Signed)
ANTICOAGULATION CONSULT NOTE  Pharmacy Consult for Rivaroxaban Indication: atrial fibrillation  No Known Allergies  Patient Measurements: Height: 5\' 11"  (180.3 cm) Weight: 134.8 kg (297 lb 3.2 oz) IBW/kg (Calculated) : 75.3  Vital Signs: Temp: 98.7 F (37.1 C) (11/23 0828) Temp Source: Oral (11/23 0828) BP: 156/108 (11/23 0828) Pulse Rate: 45 (11/23 0828)  Labs: Recent Labs    07/07/21 1126 07/07/21 1512 07/07/21 1759 07/08/21 0152 07/09/21 0252 07/10/21 0112  HGB  --   --   --  11.2* 11.6*  --   HCT  --   --   --  37.5* 38.3*  --   PLT  --   --   --  183 188  --   CREATININE  --   --   --  1.97* 1.93* 1.84*  TROPONINIHS 697* 705* 784*  --   --   --     Estimated Creatinine Clearance: 74.8 mL/min (A) (by C-G formula based on SCr of 1.84 mg/dL (H)).   Medical History: Past Medical History:  Diagnosis Date   Atrial flutter, paroxysmal ([name])    Chronic combined systolic and diastolic CHF (congestive heart failure) ([name])    Chronic kidney disease, stage III (moderate) ([name])    Dysrhythmia    brief episode afib, cardioverted did not return   Hypertension    Polysubstance abuse ([place])     Assessment: 64 YOM with history significant for pAF on rivaroxaban PTA who was admitted with elevated troponins and acute on chronic heart failure. Rivaroxaban initially held due to hemoptysis which has now resolved. CBC stable, no further s/sx bleeding. Pharmacy consulted to resume PTA rivaroxaban.    Goal of Therapy:  Monitor platelets by anticoagulation protocol: Yes   Plan:  Resume PTA Rivaroxaban 20mg  daily with supper Continue to monitor H&H and platelets    Thank you for allowing pharmacy to be a part of this patient's care.  Ardyth Harps, PharmD Clinical Pharmacist

## 2021-07-10 NOTE — Evaluation (Signed)
Occupational Therapy Evaluation/Discharge Patient Details Name: Gerald [name] MRN: [id number] DOB: 1981/06/07 Today's Date: 07/10/2021   History of Present Illness Gerald [name] is a 40 y.o. male presenting with acute CHF exacerbation due to cocaine abuse and medication non-compliance. Recent admission 10/13-10/17 due to same issue. PMH: atrial flutter, CHF, CKD, HTN, polysubstance abuse   Clinical Impression   PTA, pt lives in group home with 5 steps to enter and reports complete Independence in all daily tasks without AD use. Pt presents now at baseline for ADLs/mobility without LOB, chest pain or SOB. Pt denies any concerns for completion of ADLs, IADLs or stair mgmt at DC. Educated re: importance of medication compliance, strategies for CHF exacerbation prevention. Pt verbalized understanding and reports wanting to do better with med compliance. No further skilled therapy services needed at acute level or on DC. Please reconsult if needs change.   HR stable in 80s with activity, SpO2 WFL on RA      Recommendations for follow up therapy are one component of a multi-disciplinary discharge planning process, led by the attending physician.  Recommendations may be updated based on patient status, additional functional criteria and insurance authorization.   Follow Up Recommendations  No OT follow up    Assistance Recommended at Discharge None  Functional Status Assessment  Patient has not had a recent decline in their functional status  Equipment Recommendations  None recommended by OT    Recommendations for Other Services       Precautions / Restrictions Precautions Precautions: None Restrictions Weight Bearing Restrictions: No      Mobility Bed Mobility Overal bed mobility: Independent                  Transfers Overall transfer level: Independent Equipment used: None                      Balance Overall balance assessment: Independent                                          ADL either performed or assessed with clinical judgement   ADL Overall ADL's : Independent                                       General ADL Comments: able to don/doff socks, mobilize in room without AD, reach overhead without LOB and denies SOB or chest pain with activity     Vision Baseline Vision/History: 0 No visual deficits Ability to See in Adequate Light: 0 Adequate Patient Visual Report: No change from baseline Vision Assessment?: No apparent visual deficits     Perception     Praxis      Pertinent Vitals/Pain Pain Assessment: No/denies pain     Hand Dominance Right   Extremity/Trunk Assessment Upper Extremity Assessment Upper Extremity Assessment: Overall WFL for tasks assessed   Lower Extremity Assessment Lower Extremity Assessment: Overall WFL for tasks assessed   Cervical / Trunk Assessment Cervical / Trunk Assessment: Normal   Communication Communication Communication: No difficulties   Cognition Arousal/Alertness: Awake/alert Behavior During Therapy: WFL for tasks assessed/performed Overall Cognitive Status: Within Functional Limits for tasks assessed  General Comments  Encouraged medication compliance, notifying MDs if prescriptions run out (reports medication from hospital last time said "no refills"), limiting fluid intake, limting salt intake and daily weights    Exercises     Shoulder Instructions      Home Living Family/patient expects to be discharged to:: Group home                                 Additional Comments: Reports it is a one level home with 5 steps to enter. Pt reports having his own bedroom/bathroom with walk in shower and regular height toilet      Prior Functioning/Environment Prior Level of Function : Independent/Modified Independent             Mobility Comments: no use of AD for  mobility ADLs Comments: Pt reports Independence with ADLs, IADLs, driving and grocery shopping. Enjoys spending time with his teenage/young adult children, basketball        OT Problem List:        OT Treatment/Interventions:      OT Goals(Current goals can be found in the care plan section) Acute Rehab OT Goals Patient Stated Goal: get some rest OT Goal Formulation: All assessment and education complete, DC therapy  OT Frequency:     Barriers to D/C:            Co-evaluation              AM-PAC OT "6 Clicks" Daily Activity     Outcome Measure Help from another person eating meals?: None Help from another person taking care of personal grooming?: None Help from another person toileting, which includes using toliet, bedpan, or urinal?: None Help from another person bathing (including washing, rinsing, drying)?: None Help from another person to put on and taking off regular upper body clothing?: None Help from another person to put on and taking off regular lower body clothing?: None 6 Click Score: 24   End of Session Nurse Communication: Mobility status  Activity Tolerance: Patient tolerated treatment well Patient left: in bed;with call bell/phone within reach  OT Visit Diagnosis: Other (comment) (decreased cardiopulmonary tolerance)                Time: 0950-1007 OT Time Calculation (min): 17 min Charges:  OT General Charges $OT Visit: 1 Visit OT Evaluation $OT Eval Low Complexity: 1 Low  Malachy Chamber, OTR/L Acute Rehab Services Office: [phone]   Layla Maw 07/10/2021, 10:22 AM

## 2021-07-10 NOTE — Progress Notes (Signed)
PROGRESS NOTE    Gerald [name]  MEQ:[id number] DOB: 1981/04/27 DOA: 07/07/2021 PCP: Patient, [name] (Inactive)   Brief Narrative:  Gerald [name] is a 40 y.o. male with medical history significant of chronic combined CHF; stage 3b CKD; afib s/p DCCV on Xarelto; HTN; polysubstance abuse; and morbid obesity presenting with .  He was last admitted from 10/13-17 for acute on chronic combined CHF associated with medication noncompliance and ongoing tobacco and cocaine use.   He was doing better but did not follow up as directed and ran out of medications "because I was hard headed".   Slowly improving.   Main issue is Acute on chronic combined systolic and diastolic CHF due to cocaine abuse and medication non-compliance   Assessment & Plan:   Principal Problem:   Acute on chronic combined systolic and diastolic CHF (congestive heart failure) ([name]) Active Problems:   Community acquired pneumonia   Essential hypertension   PAF (paroxysmal atrial fibrillation) ([name])   Hypokalemia   CKD (chronic kidney disease), stage III ([name])   Polysubstance abuse ([place])   Obesity, Class III, BMI 40-49.9 (morbid obesity) ([place])   Hemoptysis   Restless leg  Acute on chronic combined systolic and diastolic CHF: Echo in June 2022 showed ejection fraction of 30 to 35% with some diastolic dysfunction. Patient presenting with hemoptysis and SOB. He recently ran out of medications again, long history of noncompliance. -He did have evidence of end organ failure, with elevated troponin, -CXR consistent with likely asymmetric edema; underlying infiltrate is a consideration, patient has been continued on Rocephin and Zithromax by prior hospitalist and plan was to continue for 5 days however I would rather get a procalcitonin level, if elevated, will continue antibiotics, otherwise probably will discontinue.  Cardiology consulted and helping out.  He was weaned off of nitroglycerin drip.  Was on Lasix IV twice daily  and has now been transitioned to oral torsemide.  On Aldactone as well.   Restless leg: Low iron level, this was replaced.  He had no complaints today.   Hemoptysis: Likely due to CHF in the setting of cocaine use.  He has not had any episode since admission.  Xarelto has been resumed by cardiology now.  Obesity, Class III, BMI 40-49.9 (morbid obesity) ([name]) Estimated body mass index is 41.33 kg/m as calculated from the following:   Height as of this encounter: 5\' 11"  (1.803 m).   Weight as of this encounter: 134.4 kg.   Polysubstance abuse ([place]) -He acknowledges ongoing marijuana and cocaine use and acknowledges that he will die if he continues to use -social work consult   CKD stage IIIa: At baseline.   Hypokalemia: Resolved.   PAF (paroxysmal atrial fibrillation) ([name]) Appears to no longer appear to be taking Amiodarone -Rate controlled with Coreg: Xarelto resumed by cardiology.  Essential hypertension: Still significantly elevated. continue amlodipine, aldactone,, hydralazine Coreg, Imdur has been increased to 120 mg.  Continue as needed hydralazine.  Right upper extremity pain and swelling: IV infiltrated in the right upper extremity, complains of pain and swelling since last night.  We will order ultrasound Doppler to rule out DVT.  DVT prophylaxis: Place and maintain sequential compression device Start: 07/07/21 1330, Xarelto resumed 07/10/2021.   Code Status: Prior  Family Communication:  None present at bedside.  Plan of care discussed with patient in length and he verbalized understanding and agreed with it.  Status is: Inpatient  Remains inpatient appropriate because: Needs further management of  heart failure.   Estimated body mass index is 41.45 kg/m as calculated from the following:   Height as of this encounter: 5\' 11"  (1.803 m).   Weight as of this encounter: 134.8 kg.     Nutritional Assessment: Body mass index is 41.45 kg/m.Marland Kitchen Seen by dietician.  I agree  with the assessment and plan as outlined below: Nutrition Status:        .  Skin Assessment: I have examined the patient's skin and I agree with the wound assessment as performed by the wound care RN as outlined below:    Consultants:  Cardiology  Procedures:  None  Antimicrobials:  Anti-infectives (From admission, onward)    Start     Dose/Rate Route Frequency Ordered Stop   07/08/21 1200  azithromycin (ZITHROMAX) 500 mg in sodium chloride 0.9 % 250 mL IVPB        500 mg 250 mL/hr over 60 Minutes Intravenous Every 24 hours 07/07/21 1324     07/08/21 1200  cefTRIAXone (ROCEPHIN) 1 g in sodium chloride 0.9 % 100 mL IVPB        1 g 200 mL/hr over 30 Minutes Intravenous Every 24 hours 07/07/21 1324     07/07/21 1115  cefTRIAXone (ROCEPHIN) 1 g in sodium chloride 0.9 % 100 mL IVPB        1 g 200 mL/hr over 30 Minutes Intravenous  Once 07/07/21 1111 07/07/21 1212   07/07/21 1115  azithromycin (ZITHROMAX) 500 mg in sodium chloride 0.9 % 250 mL IVPB        500 mg 250 mL/hr over 60 Minutes Intravenous  Once 07/07/21 1111 07/07/21 1313          Subjective: Seen and examined.  Looks and feels very tired and weak.  But no shortness of breath.  Complains of right upper extremity pain and swelling.  Objective: Vitals:   07/09/21 2356 07/10/21 0445 07/10/21 0628 07/10/21 0828  BP: (!) 151/97 (!) 152/112 (!) 158/114 (!) 156/108  Pulse: 80 83  (!) 45  Resp: 20     Temp: 98.6 F (37 C) 98.6 F (37 C)  98.7 F (37.1 C)  TempSrc: Oral Oral  Oral  SpO2: 100% 99%  99%  Weight:  134.8 kg    Height:        Intake/Output Summary (Last 24 hours) at 07/10/2021 1107 Last data filed at 07/10/2021 0830 Gross per 24 hour  Intake 1557 ml  Output 1500 ml  Net 57 ml   Filed Weights   07/08/21 0334 07/09/21 0338 07/10/21 0445  Weight: 134.7 kg 134.4 kg 134.8 kg    Examination:  General exam: Appears calm and comfortable, morbidly obese Respiratory system: Diminished breath  sounds bilaterally, respiratory effort normal. Cardiovascular system: S1 & S2 heard, RRR. No JVD, murmurs, rubs, gallops or clicks.  Trace pitting edema bilateral lower extremity. Gastrointestinal system: Abdomen is nondistended, soft and nontender. No organomegaly or masses felt. Normal bowel sounds heard. Central nervous system: Alert and oriented. No focal neurological deficits. Extremities: Symmetric 5 x 5 power.  Very minimal edema and some tenderness at the IV infiltration site but no erythema in right upper extremity. Skin: No rashes, lesions or ulcers Psychiatry: Judgement and insight appear normal. Mood & affect appropriate.    Data Reviewed: I have personally reviewed following labs and imaging studies  CBC: Recent Labs  Lab 07/07/21 0430 07/08/21 0152 07/09/21 0252  WBC 11.5* 7.6 6.3  HGB 11.7* 11.2* 11.6*  HCT 39.8 37.5*  38.3*  MCV 72.2* 71.3* 71.1*  PLT 206 183 371   Basic Metabolic Panel: Recent Labs  Lab 07/07/21 0430 07/07/21 0900 07/08/21 0152 07/09/21 0252 07/10/21 0112  NA 139  --  140 137 138  K 3.3*  --  3.0* 3.2* 3.8  CL 107  --  103 106 107  CO2 21*  --  24 23 22   GLUCOSE 113*  --  103* 112* 134*  BUN 33*  --  29* 29* 24*  CREATININE 1.98*  --  1.97* 1.93* 1.84*  CALCIUM 9.1  --  8.8* 8.9 9.3  MG  --  2.1  --   --  2.3   GFR: Estimated Creatinine Clearance: 74.8 mL/min (A) (by C-G formula based on SCr of 1.84 mg/dL (H)). Liver Function Tests: Recent Labs  Lab 07/07/21 0900  AST 31  ALT 38  ALKPHOS 68  BILITOT 1.1  PROT 7.1  ALBUMIN 3.6   No results for input(s): LIPASE, AMYLASE in the last 168 hours. No results for input(s): AMMONIA in the last 168 hours. Coagulation Profile: No results for input(s): INR, PROTIME in the last 168 hours. Cardiac Enzymes: No results for input(s): CKTOTAL, CKMB, CKMBINDEX, TROPONINI in the last 168 hours. BNP (last 3 results) No results for input(s): PROBNP in the last 8760 hours. HbA1C: No results for  input(s): HGBA1C in the last 72 hours. CBG: No results for input(s): GLUCAP in the last 168 hours. Lipid Profile: No results for input(s): CHOL, HDL, LDLCALC, TRIG, CHOLHDL, LDLDIRECT in the last 72 hours. Thyroid Function Tests: No results for input(s): TSH, T4TOTAL, FREET4, T3FREE, THYROIDAB in the last 72 hours. Anemia Panel: Recent Labs    07/08/21 0846  FERRITIN 42  TIBC 339  IRON 29*   Sepsis Labs: Recent Labs  Lab 07/07/21 1132 07/07/21 1512  LATICACIDVEN 0.8 0.9    Recent Results (from the past 240 hour(s))  Resp Panel by RT-PCR (Flu A&B, Covid) Nasopharyngeal Swab     Status: None   Collection Time: 07/07/21  9:15 AM   Specimen: Nasopharyngeal Swab; Nasopharyngeal(NP) swabs in vial transport medium  Result Value Ref Range Status   SARS Coronavirus 2 by RT PCR NEGATIVE NEGATIVE Final    Comment: (NOTE) SARS-CoV-2 target nucleic acids are NOT DETECTED.  The SARS-CoV-2 RNA is generally detectable in upper respiratory specimens during the acute phase of infection. The lowest concentration of SARS-CoV-2 viral copies this assay can detect is 138 copies/mL. A negative result does not preclude SARS-Cov-2 infection and should not be used as the sole basis for treatment or other patient management decisions. A negative result may occur with  improper specimen collection/handling, submission of specimen other than nasopharyngeal swab, presence of viral mutation(s) within the areas targeted by this assay, and inadequate number of viral copies(<138 copies/mL). A negative result must be combined with clinical observations, patient history, and epidemiological information. The expected result is Negative.  Fact Sheet for Patients:  EntrepreneurPulse.com.au  Fact Sheet for Healthcare Providers:  IncredibleEmployment.be  This test is no t yet approved or cleared by the [place] FDA and  has been authorized for detection and/or  diagnosis of SARS-CoV-2 by FDA under an Emergency Use Authorization (EUA). This EUA will remain  in effect (meaning this test can be used) for the duration of the COVID-19 declaration under Section 564(b)(1) of the Act, 21 U.S.C.section 360bbb-3(b)(1), unless the authorization is terminated  or revoked sooner.       Influenza A by PCR NEGATIVE NEGATIVE  Final   Influenza B by PCR NEGATIVE NEGATIVE Final    Comment: (NOTE) The Xpert Xpress SARS-CoV-2/FLU/RSV plus assay is intended as an aid in the diagnosis of influenza from Nasopharyngeal swab specimens and should not be used as a sole basis for treatment. Nasal washings and aspirates are unacceptable for Xpert Xpress SARS-CoV-2/FLU/RSV testing.  Fact Sheet for Patients: EntrepreneurPulse.com.au  Fact Sheet for Healthcare Providers: IncredibleEmployment.be  This test is not yet approved or cleared by the [place] FDA and has been authorized for detection and/or diagnosis of SARS-CoV-2 by FDA under an Emergency Use Authorization (EUA). This EUA will remain in effect (meaning this test can be used) for the duration of the COVID-19 declaration under Section 564(b)(1) of the Act, 21 U.S.C. section 360bbb-3(b)(1), unless the authorization is terminated or revoked.  Performed at [facility], [place] [address], [place], [place] [address]   Blood culture (routine x 2)     Status: None (Preliminary result)   Collection Time: 07/07/21 11:29 AM   Specimen: BLOOD  Result Value Ref Range Status   Specimen Description BLOOD SITE NOT SPECIFIED  Final   Special Requests   Final    BOTTLES DRAWN AEROBIC AND ANAEROBIC Blood Culture results may not be optimal due to an excessive volume of blood received in culture bottles   Culture   Final    NO GROWTH 3 DAYS Performed at [facility], [place] [address], [place], [place] [address]    Report Status PENDING  Incomplete  Blood culture (routine x  2)     Status: None (Preliminary result)   Collection Time: 07/07/21 11:36 AM   Specimen: BLOOD  Result Value Ref Range Status   Specimen Description BLOOD SITE NOT SPECIFIED  Final   Special Requests   Final    BOTTLES DRAWN AEROBIC AND ANAEROBIC Blood Culture results may not be optimal due to an excessive volume of blood received in culture bottles   Culture   Final    NO GROWTH 3 DAYS Performed at [facility], [place] [address] [place], [place] [address]    Report Status PENDING  Incomplete      Radiology Studies: No results found.  Scheduled Meds:  amLODipine  5 mg Oral Daily   carvedilol  25 mg Oral BID WC   docusate sodium  100 mg Oral BID   hydrALAZINE  100 mg Oral Q8H   isosorbide mononitrate  120 mg Oral Daily   nicotine  14 mg Transdermal Daily   potassium chloride  40 mEq Oral BID   rivaroxaban  20 mg Oral Q supper   sodium chloride flush  3 mL Intravenous Q12H   spironolactone  50 mg Oral Daily   torsemide  20 mg Oral Daily   Continuous Infusions:  azithromycin Stopped (07/09/21 1334)   cefTRIAXone (ROCEPHIN)  IV Stopped (07/09/21 1229)     LOS: 3 days   Time spent: 36 minutes   Darliss Cheney, MD [facility]  07/10/2021, 11:07 AM  Please page via [place] and do not message via secure chat for anything urgent. Secure chat can be used for anything non urgent.  How to contact the [facility] Attending or Consulting provider [place] or covering provider during after hours [place], for this patient?  Check the care team in [facility] and look for a) attending/consulting TRH provider listed and b) the [facility] team listed. Page or secure chat [phone]. Log into [email or web address] and use [place]'s  universal password to access. If you do not have the password, please contact the hospital operator. Locate the [facility] provider you are looking for under [facility] and page to a number that you can be directly reached. If you still have difficulty reaching the provider, please  page the [facility] (Director on Call) for the Hospitalists listed on amion for assistance.

## 2021-07-10 NOTE — Plan of Care (Signed)

## 2021-07-10 NOTE — TOC Initial Note (Addendum)
Transition of Care [facility]) - Initial/Assessment Note    Patient Details  Name: Gerald [name] MRN: [id number] Date of Birth: 04/30/1981  [facility] of [facility]) CM/SW Contact:    Erenest Rasher, RN Phone Number: [phone] 07/10/2021, 3:52 PM  Clinical Narrative:                 HF TOC CM spoke to pt at bedside. States he does go to [employer] at [facility] [place]. Appt scheduled for 11/28 at 10 am by phone. Pt has HF TOC clinic appt. MATCH done for his medications. Contacted pharmacy and they will run coupon card for Xarelto and Jardiance. Pt thinks he has Jardiance at home. Pt will need patient assistance completed for Xarelto and Jardiance. Printed application for pt to take to [facility] pharmacy to complete.   Expected Discharge Plan: Home/Self Care Barriers to Discharge: No Barriers Identified   Patient Goals and CMS Choice   Expected Discharge Plan and Services Expected Discharge Plan: Home/Self Care In-house Referral: Clinical Social Work Discharge Planning Services: CM Consult, [facility], Follow-up appt scheduled   Living arrangements for the past 2 months: Single Family Home   Prior Living Arrangements/Services Living arrangements for the past 2 months: Single Family Home Lives with:: Relatives Patient language and need for interpreter reviewed:: Yes        Need for Family Participation in Patient Care: No (Comment) Care giver support system in place?: No (comment)   Criminal Activity/Legal Involvement Pertinent to Current Situation/Hospitalization: No - Comment as needed  Activities of Daily Living      Permission Sought/Granted Permission sought to share information with : Case Manager, Family Supports, PCP Permission granted to share information with : Yes, Verbal Permission Granted  Share Information with NAME: Elita Boone     Permission granted to share info w Relationship: mother  Permission granted to share info w Contact Information:  [phone]  Emotional Assessment Appearance:: Appears stated age Attitude/Demeanor/Rapport: Gracious Affect (typically observed): Accepting Orientation: : Oriented to Self, Oriented to Place, Oriented to  Time, Oriented to Situation   Psych Involvement: No (comment)  Admission diagnosis:  Elevated troponin [R77.8] Hypertensive emergency [I16.1] Hemoptysis [R04.2] Acute on chronic combined systolic and diastolic CHF (congestive heart failure) ([name]) [I50.43] Hypervolemia, unspecified hypervolemia type [E87.70] Chest pain, unspecified type [R07.9] Community acquired pneumonia, unspecified laterality [J18.9] Patient Active Problem List   Diagnosis Date Noted   Restless leg 07/08/2021   Hemoptysis 43/32/9518   Acute diastolic CHF (congestive heart failure) ([place]) 03/30/2021   CHF (congestive heart failure) ([place]) 03/30/2021   Hypertensive crisis 01/20/2021   Polysubstance abuse ([place]) 01/20/2021   Obesity, Class III, BMI 40-49.9 (morbid obesity) ([place]) 01/20/2021   Hypertensive emergency 12/21/2020   CKD (chronic kidney disease), stage III ([place]) 12/21/2020   Acute on chronic systolic CHF (congestive heart failure) ([place]) 07/05/2020   PAF (paroxysmal atrial fibrillation) ([place]) 07/05/2020   Elevated troponin 07/05/2020   Microcytic anemia 07/05/2020   Hypokalemia 07/05/2020   Malignant hypertension    Acute on chronic combined systolic and diastolic CHF (congestive heart failure) ([place])    Atrial flutter ([place])    Essential hypertension    Acute systolic congestive heart failure ([place])    Acute respiratory failure ([place]) 04/01/2019   NSTEMI (non-ST elevated myocardial infarction) ([place]) 12/21/2015   Community acquired pneumonia 12/21/2015   PCP:  Patient, [name] (Inactive) Pharmacy:   [id number] - , [place] - [place]  [address] [place] [address] Phone: [phone] Fax: [phone]  [facility] [address] [place] [address] Phone: [phone] Fax: [phone]  [id number] [place], [place] - [address] [place] [address] Phone: [phone] Fax: [phone]  [facility] [place], [place] - [address] [employer] [place] [address] Phone: [phone] Fax: [phone]     Social Determinants of Health ([place]) Interventions Food Insecurity Interventions: Intervention Not Indicated Financial Strain Interventions: Intervention Not Indicated Housing Interventions: Intervention Not Indicated Transportation Interventions: Intervention Not Indicated  Readmission Risk Interventions Readmission Risk Prevention Plan 06/03/2021 04/02/2021  Transportation Screening Complete Complete  Medication Review (RN Care Manager) Complete Complete  PCP or Specialist appointment within 3-5 days of discharge Complete Complete  HRI or Home Care Consult Complete Complete  SW Recovery Care/Counseling Consult Complete Complete  Palliative Care Screening Not Applicable Not [place] Not Applicable Not Applicable  Some recent data might be hidden

## 2021-07-11 LAB — BASIC METABOLIC PANEL
Anion gap: 9 (ref 5–15)
BUN: 24 mg/dL — ABNORMAL HIGH (ref 6–20)
CO2: 22 mmol/L (ref 22–32)
Calcium: 9.4 mg/dL (ref 8.9–10.3)
Chloride: 106 mmol/L (ref 98–111)
Creatinine, Ser: 1.83 mg/dL — ABNORMAL HIGH (ref 0.61–1.24)
GFR, Estimated: 47 mL/min — ABNORMAL LOW (ref >60)
Glucose, Bld: 91 mg/dL (ref 70–99)
Potassium: 4.2 mmol/L (ref 3.5–5.1)
Sodium: 137 mmol/L (ref 135–145)

## 2021-07-11 MED ORDER — TORSEMIDE 20 MG PO TABS
40.0000 mg | ORAL_TABLET | Freq: Every day | ORAL | Status: DC
  Administered 2021-07-11: 40 mg via ORAL
  Filled 2021-07-11: qty 2

## 2021-07-11 MED ORDER — AMLODIPINE BESYLATE 10 MG PO TABS
10.0000 mg | ORAL_TABLET | Freq: Every day | ORAL | Status: DC
  Administered 2021-07-11: 10 mg via ORAL
  Filled 2021-07-11: qty 1

## 2021-07-11 MED ORDER — DOXAZOSIN MESYLATE 2 MG PO TABS
2.0000 mg | ORAL_TABLET | Freq: Every day | ORAL | Status: DC
  Administered 2021-07-11: 2 mg via ORAL
  Filled 2021-07-11: qty 1

## 2021-07-11 NOTE — Plan of Care (Signed)

## 2021-07-11 NOTE — Progress Notes (Signed)
Progress Note  Patient Name: Gerald [name] Date of Encounter: 07/11/2021  [facility] Cardiologist: Ida Rogue, MD   Subjective   Feeling better.  Breathing is at his baseline.  Notes that his BP recordings are not accurate  Inpatient Medications    Scheduled Meds:  amLODipine  10 mg Oral Daily   carvedilol  25 mg Oral BID WC   docusate sodium  100 mg Oral BID   doxazosin  2 mg Oral Daily   hydrALAZINE  100 mg Oral Q8H   isosorbide mononitrate  120 mg Oral Daily   nicotine  14 mg Transdermal Daily   potassium chloride  40 mEq Oral BID   rivaroxaban  20 mg Oral Q supper   sodium chloride flush  3 mL Intravenous Q12H   spironolactone  50 mg Oral Daily   torsemide  40 mg Oral Daily   Continuous Infusions:  azithromycin 500 mg (07/10/21 1242)   cefTRIAXone (ROCEPHIN)  IV 1 g (07/10/21 1707)   PRN Meds: acetaminophen **OR** acetaminophen, bisacodyl, hydrALAZINE, morphine injection, ondansetron **OR** ondansetron (ZOFRAN) IV, oxyCODONE, polyethylene glycol   Vital Signs    Vitals:   07/10/21 1301 07/10/21 1956 07/11/21 0336 07/11/21 0946  BP: 113/75 (!) 145/97 (!) 163/107 122/77  Pulse: 78 81 81   Resp: 20 16 14    Temp: (!) 97.3 F (36.3 C) (!) 97.5 F (36.4 C) 97.6 F (36.4 C)   TempSrc: Oral Oral Oral   SpO2: 99% 100% 100%   Weight:   133.7 kg   Height:        Intake/Output Summary (Last 24 hours) at 07/11/2021 0954 Last data filed at 07/11/2021 0400 Gross per 24 hour  Intake 1090.99 ml  Output 925 ml  Net 165.99 ml   Last 3 Weights 07/11/2021 07/10/2021 07/09/2021  Weight (lbs) 294 lb 12.8 oz 297 lb 3.2 oz 296 lb 4.8 oz  Weight (kg) 133.72 kg 134.809 kg 134.4 kg      Telemetry    Sinus rhythm.  Frequent PVCs - Personally Reviewed  ECG    N/a  Physical Exam   VS:  BP 122/77   Pulse 81   Temp 97.6 F (36.4 C) (Oral)   Resp 14   Ht 5\' 11"  (1.803 m)   Wt 133.7 kg   SpO2 100%   BMI 41.12 kg/m  , BMI Body mass index is 41.12  kg/m. GENERAL:  Well appearing HEENT: Pupils equal round and reactive, fundi not visualized, oral mucosa unremarkable NECK:  No JVD.  Waveform within normal limits, carotid upstroke brisk and symmetric, no bruits, no thyromegaly LUNGS: CTAB HEART:  RRR.  PMI not displaced or sustained,S1 and S2 within normal limits, no S3, no S4, no clicks, no rubs, no murmurs ABD:  Flat, positive bowel sounds normal in frequency in pitch, no bruits, no rebound, no guarding, no midline pulsatile mass, no hepatomegaly, no splenomegaly EXT:  2 plus pulses throughout, no LE edema, no cyanosis no clubbing SKIN:  No rashes no nodules NEURO:  Cranial nerves [name] through XII grossly intact, motor grossly intact throughout PSYCH:  Cognitively intact, oriented to person place and time   Labs    High Sensitivity Troponin:   Recent Labs  Lab 07/07/21 0430 07/07/21 0645 07/07/21 1126 07/07/21 1512 07/07/21 1759  TROPONINIHS 762* 790* 697* 705* 784*     Chemistry Recent Labs  Lab 07/07/21 0900 07/08/21 0152 07/09/21 0252 07/10/21 0112 07/11/21 0231  NA  --    < >  137 138 137  K  --    < > 3.2* 3.8 4.2  CL  --    < > 106 107 106  CO2  --    < > 23 22 22   GLUCOSE  --    < > 112* 134* 91  BUN  --    < > 29* 24* 24*  CREATININE  --    < > 1.93* 1.84* 1.83*  CALCIUM  --    < > 8.9 9.3 9.4  MG 2.1  --   --  2.3  --   PROT 7.1  --   --   --   --   ALBUMIN 3.6  --   --   --   --   AST 31  --   --   --   --   ALT 38  --   --   --   --   ALKPHOS 68  --   --   --   --   BILITOT 1.1  --   --   --   --   GFRNONAA  --    < > 44* 47* 47*  ANIONGAP  --    < > 8 9 9    < > = values in this interval not displayed.    Lipids No results for input(s): CHOL, TRIG, HDL, LABVLDL, LDLCALC, CHOLHDL in the last 168 hours.  Hematology Recent Labs  Lab 07/07/21 0430 07/08/21 0152 07/09/21 0252  WBC 11.5* 7.6 6.3  RBC 5.51 5.26 5.39  HGB 11.7* 11.2* 11.6*  HCT 39.8 37.5* 38.3*  MCV 72.2* 71.3* 71.1*  MCH 21.2*  21.3* 21.5*  MCHC 29.4* 29.9* 30.3  RDW 19.3* 18.6* 19.0*  PLT 206 183 188   Thyroid No results for input(s): TSH, FREET4 in the last 168 hours.  BNP Recent Labs  Lab 07/07/21 0431  BNP 1,323.8*    DDimer No results for input(s): DDIMER in the last 168 hours.   Radiology    VAS [place] UPPER EXTREMITY VENOUS DUPLEX  Result Date: 07/11/2021 UPPER VENOUS STUDY  Patient Name:  Gerald [name]  Date of Exam:   07/10/2021 Medical Rec #: [id number]         Accession #:    [id number] Date of Birth: 1981/01/20         Patient Gender: M Patient Age:   40 years Exam Location[facility]  [facility] Procedure:      VAS [place] UPPER EXTREMITY VENOUS DUPLEX Referring Phys: RAVI PAHWANI --------------------------------------------------------------------------------  Indications: Swelling and pain at recent IV site, right forearm Comparison Study: No prior studies. Performing Technologist: Darlin Coco RDMS, RVT  Examination Guidelines: A complete evaluation includes B-mode imaging, spectral Doppler, color Doppler, and power Doppler as needed of all accessible portions of each vessel. Bilateral testing is considered an integral part of a complete examination. Limited examinations for reoccurring indications may be performed as noted.  Right Findings: +----------+------------+---------+-----------+----------+-------+ RIGHT     CompressiblePhasicitySpontaneousPropertiesSummary +----------+------------+---------+-----------+----------+-------+ IJV           Full       Yes       Yes                      +----------+------------+---------+-----------+----------+-------+ Subclavian    Full       Yes       Yes                      +----------+------------+---------+-----------+----------+-------+  Axillary      Full       Yes       Yes                      +----------+------------+---------+-----------+----------+-------+ Brachial      Full                                           +----------+------------+---------+-----------+----------+-------+ Radial        Full                                          +----------+------------+---------+-----------+----------+-------+ Ulnar         Full                                          +----------+------------+---------+-----------+----------+-------+ Cephalic      None       No        No                Acute  +----------+------------+---------+-----------+----------+-------+ Basilic       Full                                          +----------+------------+---------+-----------+----------+-------+  Left Findings: +----------+------------+---------+-----------+----------+-------+ LEFT      CompressiblePhasicitySpontaneousPropertiesSummary +----------+------------+---------+-----------+----------+-------+ Subclavian    Full       Yes       Yes                      +----------+------------+---------+-----------+----------+-------+  Summary:  Right: No evidence of deep vein thrombosis in the upper extremity. Findings consistent with acute superficial vein thrombosis involving the right cephalic vein at the level of the mid forearm.  Left: No evidence of thrombosis in the subclavian.  *See table(s) above for measurements and observations.  Diagnosing physician: Harold Barban MD Electronically signed by Harold Barban MD on 07/11/2021 at 12:48:53 AM.    Final     Cardiac Studies   Echo 01/20/2021  1. Left ventricular ejection fraction, by estimation, is 30 to 35%. The  left ventricle has moderately decreased function. The left ventricle  demonstrates global hypokinesis. The left ventricular internal cavity size  was mildly dilated. There is severe  left ventricular hypertrophy. Left ventricular diastolic parameters are  indeterminate.   2. Right ventricular systolic function is normal. The right ventricular  size is normal. Moderately increased right ventricular wall thickness.  Tricuspid  regurgitation signal is inadequate for assessing PA pressure.   3. Left atrial size was severely dilated.   4. Right atrial size was mildly dilated.   5. The mitral valve is normal in structure. Trivial mitral valve  regurgitation.   6. The aortic valve is tricuspid. Aortic valve regurgitation is trivial.  No aortic stenosis is present.   7. The inferior vena cava is dilated in size with >50% respiratory  variability, suggesting right atrial pressure of 8 mmHg.     Patient Profile     41 y.o. male with chronic systolic and diastolic heart failure, PAF,  hypertension, morbid obesity, polysubstance abuse, CKD and non-adherence admitted with elevated troponin and acute on chronic heart failure.    Assessment & Plan    # Acute on chronic systolic and diastolic heart failure: # Non-adherence: # Hypertensive urgency:  Missed TOC appointment and has multiple admissions in the setting of not taking medications as prescribed.  He ran out of his medication 4 days prior to admission.  Continue carvedilol, hydralazine, and spironolactone.  He was initially on nitroglycerin infusion but is now back on Imdur and the dose has been increased.  Amlodipine was added yesterday and increased to 10mg .  He notes that his BP cuff is not accurately sized and he is correct.  Unsure how reliable his BP recordings are.  Continue carvedilol, hydralazine, and spironolactone, which was also increased.  He is not on Entresto due to chronic kidney disease.  He is back on oral torsemide.  Will increase the dose to 40mg  to maintain a negative fluid balance.  Recommend adding a low-dose ARB for both hypertension and chronic kidney disease if nephrology agrees.   # Elevated troponin: Demand ischemia.  Coronaries were normal on cath 12/2020.  Stopping aspirin and statin given that he struggle with adherence and has no ASCVD.   # PAF: Xarelto was held in setting of hemoptysis. CT concerning for viral infection. H/h remained  stable.  Will resume Xarelto.  # Polysubstance abuse:  Chronic tobacco and cocaine abuse.   # Hyperlipidemia:  Atorvastatin and aspirin were stopped this admission to simplify his regimen given his adherence issues.  No CAD on cath or chest CTs.   [facility] will sign off.   Medication Recommendations:  Agree with increasing amlodipine.  Increase torsemide.  Consider ARB as an outpatient. Other recommendations (labs, testing, etc): BMP 1 week. Follow up as an outpatient:  [facility] clinic 12/1    For questions or updates, please contact [place] Please consult [email or web address] for contact info under        Signed, Skeet Latch, MD  07/11/2021, 9:54 AM

## 2021-07-11 NOTE — Progress Notes (Signed)
Mobility Specialist Progress Note:   07/11/21 1220  Mobility  Activity Ambulated to bathroom  Level of Assistance Independent  Assistive Device None  Distance Ambulated (ft) 30 ft  Mobility Out of bed for toileting  Mobility Response Tolerated well  Mobility performed by Mobility specialist  $Mobility charge 1 Mobility   [facility] Primary Phone [phone] Secondary Phone [phone]

## 2021-07-11 NOTE — Discharge Summary (Signed)
Physician Discharge Summary  Gerald [name] PZW:[id number] DOB: March 19, 1981 DOA: 07/07/2021  PCP: Patient, [name] (Inactive)  Admit date: 07/07/2021 Discharge date: 07/11/2021 30 Day Unplanned Readmission Risk Score    Flowsheet Row ED to Hosp-Admission (Discharged) from 07/07/2021 in [facility]  30 Day Unplanned Readmission Risk Score (%) 37.34 Filed at 07/11/2021 1600       This score is the patient's risk of an unplanned readmission within 30 days of being discharged (0 -100%). The score is based on dignosis, age, lab data, medications, orders, and past utilization.   Low:  0-14.9   Medium: 15-21.9   High: 22-29.9   Extreme: 30 and above          Admitted From: Home Disposition: Left AGAINST MEDICAL ADVICE  Recommendations for Outpatient Follow-up:  Follow up with PCP in 1-2 weeks Please obtain BMP/CBC in one week Please follow up with your PCP on the following pending results: Unresulted Labs (From admission, onward)    None        CODE STATUS: Full code Diet recommendation: Low-sodium  Subjective: Patient was seen and examined earlier in the day.  We discussed about plan of keeping him in the hospital, adjust his antihypertensives to control his blood pressure better and likely discharge tomorrow however later in the day, patient decided to leave [facility] and he did not want to wait for me to come talk to him.  Brief/Interim Summary: Gerald [name] is a 40 y.o. male with medical history significant of chronic combined CHF; stage 3b CKD; afib s/p DCCV on Xarelto; HTN; polysubstance abuse; and morbid obesity presenting with hemoptysis.  He was last admitted from 10/13-17 for acute on chronic combined CHF associated with medication noncompliance and ongoing tobacco and cocaine use.   He was doing better but did not follow up as directed and ran out of medications "because I was hard headed".   He was admitted with following diagnosis  and detailed hospitalization as below as well.   Acute on chronic combined systolic and diastolic CHF: Echo in June 2022 showed ejection fraction of 30 to 35% with some diastolic dysfunction. Patient presenting with hemoptysis and SOB. He recently ran out of medications again, long history of noncompliance. -He did have evidence of end organ failure, with elevated troponin, -CXR consistent with likely asymmetric edema; underlying infiltrate was a consideration, patient was continued on Rocephin and Zithromax by prior hospitalist and plan was to continue for 5 days however I checked procalcitonin which was unremarkable, argues against bacterial pneumonia, so antibiotics were discontinued.  He had received about 2 to 3 days of antibiotics.  He was weaned off of nitroglycerin drip.  Was on Lasix IV twice daily and was transitioned to oral torsemide which was increased from 20 mg daily to 40 mg daily now by cardiology.  On Aldactone as well.  Please note that in order to prepare this patient for discharge on Thanksgiving/holiday, I had sent his medications to [facility] on Wednesday/day prior since pharmacy was closed on Thanksgiving day.  At that time, he was on torsemide 20 mg and amlodipine 5 mg.  Both of these medications were doubled the dose on 07/11/2021 however patient left AGAINST MEDICAL ADVICE and thus these medication changes were not done.  I discussed with cardiology, per them, they will reach out to him with change in dosages.   Hemoptysis: Likely due to CHF in the setting of cocaine use.  He has not had any episode since admission.  Xarelto has been resumed by cardiology now.   Obesity, Class III, BMI 40-49.9 (morbid obesity) ([name]) Estimated body mass index is 41.33 kg/m as calculated from the following:   Height as of this encounter: 5\' 11"  (1.803 m).   Weight as of this encounter: 134.4 kg.   Polysubstance abuse ([place]) -He acknowledges ongoing marijuana and cocaine use and acknowledges  that he will die if he continues to use -social work consult   CKD stage IIIa: At baseline.   Hypokalemia: Resolved.   PAF (paroxysmal atrial fibrillation) ([name]) Appears to no longer appear to be taking Amiodarone -Rate controlled with Coreg: Xarelto resumed by cardiology.   Essential hypertension: Is still diastolic significantly elevated over 100, we increased his amlodipine to 10 mg, continue current dose of aldactone,, hydralazine Coreg, Imdur has been increased to 120 mg.  Continue as needed hydralazine.  Torsemide was also increased to 40 mg p.o. daily.   Right upper extremity pain and swelling/thrombophlebitis: Doppler negative for DVT.  Mild thrombophlebitis but he had no complaints.  Discharge Diagnoses:  Principal Problem:   Acute on chronic combined systolic and diastolic CHF (congestive heart failure) ([name]) Active Problems:   Community acquired pneumonia   Essential hypertension   PAF (paroxysmal atrial fibrillation) ([name])   Hypokalemia   CKD (chronic kidney disease), stage III ([name])   Polysubstance abuse ([name])   Obesity, Class III, BMI 40-49.9 (morbid obesity) ([place])   Hemoptysis   Restless leg    Discharge Instructions   Allergies as of 07/11/2021   No Known Allergies      Medication List     STOP taking these medications    furosemide 20 MG tablet Commonly known as: LASIX       TAKE these medications    amLODipine 5 MG tablet Commonly known as: NORVASC Take 1 tablet (5 mg total) by mouth daily.   atorvastatin 40 MG tablet Commonly known as: LIPITOR Take 1 tablet (40 mg total) by mouth daily. What changed: when to take this   carvedilol 25 MG tablet Commonly known as: COREG Take 1 tablet (25 mg total) by mouth 2 (two) times daily with a meal.   DIUREX PO Take 1 tablet by mouth daily as needed (bloating).   hydrALAZINE 100 MG tablet Commonly known as: APRESOLINE Take 1 tablet (100 mg total) by mouth every 8 (eight) hours.    isosorbide mononitrate 60 MG 24 hr tablet Commonly known as: IMDUR Take 2 tablets (120 mg total) by mouth daily. What changed: how much to take   Jardiance 10 MG Tabs tablet Generic drug: empagliflozin Take 1 tablet (10 mg total) by mouth daily.   potassium chloride 10 MEQ tablet Commonly known as: KLOR-CON Take 1 tablet (10 mEq total) by mouth daily. What changed: when to take this   spironolactone 50 MG tablet Commonly known as: ALDACTONE Take 1 tablet (50 mg total) by mouth daily. What changed:  medication strength how much to take   torsemide 20 MG tablet Commonly known as: DEMADEX Take 1 tablet (20 mg total) by mouth daily.   Xarelto 20 MG Tabs tablet Generic drug: rivaroxaban Take 1 tablet (20 mg total) by mouth daily with supper.        Follow-up Information     [facility] Go on 08/23/2021.   Why: PLEASE call and establish priamry care doctor for ongoing health issues. THey have finacial assistance, and pharmacy@9 :30am Contact  information: [address] [place] [address] [phone]               No Known Allergies  Consultations: Cardiology   Procedures/Studies: DG Chest 2 View  Result Date: 07/07/2021 CLINICAL DATA:  40 year old male with cough and hemoptysis. EXAM: CHEST - 2 VIEW COMPARISON:  Portable chest 05/30/2021 and earlier. FINDINGS: AP and lateral views of the chest. Stable cardiomegaly and mediastinal contours. Visualized tracheal air column is within normal limits. Unresolved over this series of exams this year is asymmetric patchy and indistinct pulmonary opacity which is generally worse in the right lung. Similar appearance on the August CT scout view 03/30/2021, with broad differential diagnosis at that time. However, portable chest x-ray 04/03/2031 the lungs were relatively clear. Currently no superimposed pneumothorax, pleural effusion or consolidation. No acute osseous  abnormality identified. Negative visible bowel gas. IMPRESSION: Cardiomegaly with recurrent asymmetric confluent but indistinct pulmonary opacity, seen on multiple exams this year including Chest CT 03/30/2021. Favor Recurrent Asymmetric Pulmonary Edema over the other differential considerations listed on that CT report. Electronically Signed   By: Genevie Ann M.D.   On: 07/07/2021 05:03   CT Angio Chest PE W and/or Wo Contrast  Result Date: 07/07/2021 CLINICAL DATA:  PE suspected, chest pain, shortness of breath, elevated blood pressures, cocaine use EXAM: CT ANGIOGRAPHY CHEST WITH CONTRAST TECHNIQUE: Multidetector CT imaging of the chest was performed using the standard protocol during bolus administration of intravenous contrast. Multiplanar CT image reconstructions and MIPs were obtained to evaluate the vascular anatomy. CONTRAST:  12mL OMNIPAQUE IOHEXOL 350 MG/ML SOLN COMPARISON:  03/30/2021 FINDINGS: Cardiovascular: Examination for pulmonary embolism is limited by poor contrast bolus and breath motion artifact. Within this limitation, no evidence of central or lobar pulmonary embolism. Cardiomegaly. No pericardial effusion. Gross enlargement of the main pulmonary artery, measuring up to 4.0 cm in caliber. Mediastinum/Nodes: No enlarged mediastinal, hilar, or axillary lymph nodes. Thyroid gland, trachea, and esophagus demonstrate no significant findings. Lungs/Pleura: Heterogeneous consolidative and ground-glass airspace opacity throughout the right lung, primarily in the right upper lobe, similar in appearance although fluctuant in comparison to prior examination dated 03/30/2021 (series 7, image 30). No pleural effusion or pneumothorax. Upper Abdomen: No acute abnormality. Musculoskeletal: No chest wall abnormality. No acute or significant osseous findings. Review of the MIP images confirms the above findings. IMPRESSION: 1. Examination for pulmonary embolism is limited by poor contrast bolus and breath  motion artifact. Within this limitation, no evidence of central or lobar pulmonary embolism. The segmental and more distal pulmonary arteries are inadequately assessed to exclude embolism. 2. Heterogeneous consolidative and ground-glass airspace opacity throughout the right lung, primarily in the right upper lobe, similar in appearance although fluctuant in comparison to prior examination dated 03/30/2021. Findings are most consistent with multifocal infection. 3. Cardiomegaly. 4. Gross enlargement of the main pulmonary artery, as can be seen in pulmonary hypertension. Electronically Signed   By: Delanna Ahmadi M.D.   On: 07/07/2021 09:53   VAS [place] UPPER EXTREMITY VENOUS DUPLEX  Result Date: 07/11/2021 UPPER VENOUS STUDY  Patient Name:  Gerald [name]  Date of Exam:   07/10/2021 Medical Rec #: [id number]         Accession #:    [id number] Date of Birth: 11-19-1980         Patient Gender: M Patient Age:   32 years Exam Location:  [facility] Procedure:      VAS [place] UPPER EXTREMITY VENOUS DUPLEX Referring Phys:  Archit Leger --------------------------------------------------------------------------------  Indications: Swelling and pain at recent IV site, right forearm Comparison Study: No prior studies. Performing Technologist: Darlin Coco RDMS, RVT  Examination Guidelines: A complete evaluation includes B-mode imaging, spectral Doppler, color Doppler, and power Doppler as needed of all accessible portions of each vessel. Bilateral testing is considered an integral part of a complete examination. Limited examinations for reoccurring indications may be performed as noted.  Right Findings: +----------+------------+---------+-----------+----------+-------+ RIGHT     CompressiblePhasicitySpontaneousPropertiesSummary +----------+------------+---------+-----------+----------+-------+ IJV           Full       Yes       Yes                       +----------+------------+---------+-----------+----------+-------+ Subclavian    Full       Yes       Yes                      +----------+------------+---------+-----------+----------+-------+ Axillary      Full       Yes       Yes                      +----------+------------+---------+-----------+----------+-------+ Brachial      Full                                          +----------+------------+---------+-----------+----------+-------+ Radial        Full                                          +----------+------------+---------+-----------+----------+-------+ Ulnar         Full                                          +----------+------------+---------+-----------+----------+-------+ Cephalic      None       No        No                Acute  +----------+------------+---------+-----------+----------+-------+ Basilic       Full                                          +----------+------------+---------+-----------+----------+-------+  Left Findings: +----------+------------+---------+-----------+----------+-------+ LEFT      CompressiblePhasicitySpontaneousPropertiesSummary +----------+------------+---------+-----------+----------+-------+ Subclavian    Full       Yes       Yes                      +----------+------------+---------+-----------+----------+-------+  Summary:  Right: No evidence of deep vein thrombosis in the upper extremity. Findings consistent with acute superficial vein thrombosis involving the right cephalic vein at the level of the mid forearm.  Left: No evidence of thrombosis in the subclavian.  *See table(s) above for measurements and observations.  Diagnosing physician: Harold Barban MD Electronically signed by Harold Barban MD on 07/11/2021 at 12:48:53 AM.    Final      Discharge Exam: Vitals:   07/11/21 0946 07/11/21 1122  BP: 122/77 (!) 126/98  Pulse:  74  Resp:  17  Temp:  98.2 F (36.8 C)  SpO2:  96%    Vitals:   07/10/21 1956 07/11/21 0336 07/11/21 0946 07/11/21 1122  BP: (!) 145/97 (!) 163/107 122/77 (!) 126/98  Pulse: 81 81  74  Resp: 16 14  17   Temp: (!) 97.5 F (36.4 C) 97.6 F (36.4 C)  98.2 F (36.8 C)  TempSrc: Oral Oral  Oral  SpO2: 100% 100%  96%  Weight:  133.7 kg    Height:        General: Pt is alert, awake, not in acute distress Cardiovascular: RRR, S1/S2 +, no rubs, no gallops Respiratory: CTA bilaterally, no wheezing, no rhonchi Abdominal: Soft, NT, ND, bowel sounds + Extremities: no edema, no cyanosis    The results of significant diagnostics from this hospitalization (including imaging, microbiology, ancillary and laboratory) are listed below for reference.     Microbiology: Recent Results (from the past 240 hour(s))  Resp Panel by RT-PCR (Flu A&B, Covid) Nasopharyngeal Swab     Status: None   Collection Time: 07/07/21  9:15 AM   Specimen: Nasopharyngeal Swab; Nasopharyngeal(NP) swabs in vial transport medium  Result Value Ref Range Status   SARS Coronavirus 2 by RT PCR NEGATIVE NEGATIVE Final    Comment: (NOTE) SARS-CoV-2 target nucleic acids are NOT DETECTED.  The SARS-CoV-2 RNA is generally detectable in upper respiratory specimens during the acute phase of infection. The lowest concentration of SARS-CoV-2 viral copies this assay can detect is 138 copies/mL. A negative result does not preclude SARS-Cov-2 infection and should not be used as the sole basis for treatment or other patient management decisions. A negative result may occur with  improper specimen collection/handling, submission of specimen other than nasopharyngeal swab, presence of viral mutation(s) within the areas targeted by this assay, and inadequate number of viral copies(<138 copies/mL). A negative result must be combined with clinical observations, patient history, and epidemiological information. The expected result is Negative.  Fact Sheet for Patients:   EntrepreneurPulse.com.au  Fact Sheet for Healthcare Providers:  IncredibleEmployment.be  This test is no t yet approved or cleared by the [place] FDA and  has been authorized for detection and/or diagnosis of SARS-CoV-2 by FDA under an Emergency Use Authorization (EUA). This EUA will remain  in effect (meaning this test can be used) for the duration of the COVID-19 declaration under Section 564(b)(1) of the Act, 21 U.S.C.section 360bbb-3(b)(1), unless the authorization is terminated  or revoked sooner.       Influenza A by PCR NEGATIVE NEGATIVE Final   Influenza B by PCR NEGATIVE NEGATIVE Final    Comment: (NOTE) The Xpert Xpress SARS-CoV-2/FLU/RSV plus assay is intended as an aid in the diagnosis of influenza from Nasopharyngeal swab specimens and should not be used as a sole basis for treatment. Nasal washings and aspirates are unacceptable for Xpert Xpress SARS-CoV-2/FLU/RSV testing.  Fact Sheet for Patients: EntrepreneurPulse.com.au  Fact Sheet for Healthcare Providers: IncredibleEmployment.be  This test is not yet approved or cleared by the [place] FDA and has been authorized for detection and/or diagnosis of SARS-CoV-2 by FDA under an Emergency Use Authorization (EUA). This EUA will remain in effect (meaning this test can be used) for the duration of the COVID-19 declaration under Section 564(b)(1) of the Act, 21 U.S.C. section 360bbb-3(b)(1), unless the authorization is terminated or revoked.  Performed at [facility], [place] [address], [place], [place] [address]   Blood culture (routine x 2)  Status: None (Preliminary result)   Collection Time: 07/07/21 11:29 AM   Specimen: BLOOD  Result Value Ref Range Status   Specimen Description BLOOD SITE NOT SPECIFIED  Final   Special Requests   Final    BOTTLES DRAWN AEROBIC AND ANAEROBIC Blood Culture results may not be optimal  due to an excessive volume of blood received in culture bottles   Culture   Final    NO GROWTH 4 DAYS Performed at [facility] [address] [place], [place] [address]    Report Status PENDING  Incomplete  Blood culture (routine x 2)     Status: None (Preliminary result)   Collection Time: 07/07/21 11:36 AM   Specimen: BLOOD  Result Value Ref Range Status   Specimen Description BLOOD SITE NOT SPECIFIED  Final   Special Requests   Final    BOTTLES DRAWN AEROBIC AND ANAEROBIC Blood Culture results may not be optimal due to an excessive volume of blood received in culture bottles   Culture   Final    NO GROWTH 4 DAYS Performed at [facility], [place] [address], [place], [place] [address]    Report Status PENDING  Incomplete     Labs: BNP (last 3 results) Recent Labs    03/30/21 0858 05/30/21 0648 07/07/21 0431  BNP >4,500.0* >4,500.0* 6,945.0*   Basic Metabolic Panel: Recent Labs  Lab 07/07/21 0430 07/07/21 0900 07/08/21 0152 07/09/21 0252 07/10/21 0112 07/11/21 0231  NA 139  --  140 137 138 137  K 3.3*  --  3.0* 3.2* 3.8 4.2  CL 107  --  103 106 107 106  CO2 21*  --  24 23 22 22   GLUCOSE 113*  --  103* 112* 134* 91  BUN 33*  --  29* 29* 24* 24*  CREATININE 1.98*  --  1.97* 1.93* 1.84* 1.83*  CALCIUM 9.1  --  8.8* 8.9 9.3 9.4  MG  --  2.1  --   --  2.3  --    Liver Function Tests: Recent Labs  Lab 07/07/21 0900  AST 31  ALT 38  ALKPHOS 68  BILITOT 1.1  PROT 7.1  ALBUMIN 3.6   No results for input(s): LIPASE, AMYLASE in the last 168 hours. No results for input(s): AMMONIA in the last 168 hours. CBC: Recent Labs  Lab 07/07/21 0430 07/08/21 0152 07/09/21 0252  WBC 11.5* 7.6 6.3  HGB 11.7* 11.2* 11.6*  HCT 39.8 37.5* 38.3*  MCV 72.2* 71.3* 71.1*  PLT 206 183 188   Cardiac Enzymes: No results for input(s): CKTOTAL, CKMB, CKMBINDEX, TROPONINI in the last 168 hours. BNP: Invalid input(s): POCBNP CBG: No results for input(s): GLUCAP in  the last 168 hours. D-Dimer No results for input(s): DDIMER in the last 72 hours. Hgb A1c No results for input(s): HGBA1C in the last 72 hours. Lipid Profile No results for input(s): CHOL, HDL, LDLCALC, TRIG, CHOLHDL, LDLDIRECT in the last 72 hours. Thyroid function studies No results for input(s): TSH, T4TOTAL, T3FREE, THYROIDAB in the last 72 hours.  Invalid input(s): FREET3 Anemia work up No results for input(s): VITAMINB12, FOLATE, FERRITIN, TIBC, IRON, RETICCTPCT in the last 72 hours. Urinalysis No results found for: COLORURINE, APPEARANCEUR, [place], [place], GLUCOSEU, [place], [place], [place], PROTEINUR, UROBILINOGEN, NITRITE, LEUKOCYTESUR Sepsis Labs Invalid input(s): PROCALCITONIN,  WBC,  LACTICIDVEN Microbiology Recent Results (from the past 240 hour(s))  Resp Panel by RT-PCR (Flu A&B, Covid) Nasopharyngeal Swab     Status: None  Collection Time: 07/07/21  9:15 AM   Specimen: Nasopharyngeal Swab; Nasopharyngeal(NP) swabs in vial transport medium  Result Value Ref Range Status   SARS Coronavirus 2 by RT PCR NEGATIVE NEGATIVE Final    Comment: (NOTE) SARS-CoV-2 target nucleic acids are NOT DETECTED.  The SARS-CoV-2 RNA is generally detectable in upper respiratory specimens during the acute phase of infection. The lowest concentration of SARS-CoV-2 viral copies this assay can detect is 138 copies/mL. A negative result does not preclude SARS-Cov-2 infection and should not be used as the sole basis for treatment or other patient management decisions. A negative result may occur with  improper specimen collection/handling, submission of specimen other than nasopharyngeal swab, presence of viral mutation(s) within the areas targeted by this assay, and inadequate number of viral copies(<138 copies/mL). A negative result must be combined with clinical observations, patient history, and epidemiological information. The expected result is Negative.  Fact Sheet for  Patients:  EntrepreneurPulse.com.au  Fact Sheet for Healthcare Providers:  IncredibleEmployment.be  This test is no t yet approved or cleared by the [place] FDA and  has been authorized for detection and/or diagnosis of SARS-CoV-2 by FDA under an Emergency Use Authorization (EUA). This EUA will remain  in effect (meaning this test can be used) for the duration of the COVID-19 declaration under Section 564(b)(1) of the Act, 21 U.S.C.section 360bbb-3(b)(1), unless the authorization is terminated  or revoked sooner.       Influenza A by PCR NEGATIVE NEGATIVE Final   Influenza B by PCR NEGATIVE NEGATIVE Final    Comment: (NOTE) The Xpert Xpress SARS-CoV-2/FLU/RSV plus assay is intended as an aid in the diagnosis of influenza from Nasopharyngeal swab specimens and should not be used as a sole basis for treatment. Nasal washings and aspirates are unacceptable for Xpert Xpress SARS-CoV-2/FLU/RSV testing.  Fact Sheet for Patients: EntrepreneurPulse.com.au  Fact Sheet for Healthcare Providers: IncredibleEmployment.be  This test is not yet approved or cleared by the [place] FDA and has been authorized for detection and/or diagnosis of SARS-CoV-2 by FDA under an Emergency Use Authorization (EUA). This EUA will remain in effect (meaning this test can be used) for the duration of the COVID-19 declaration under Section 564(b)(1) of the Act, 21 U.S.C. section 360bbb-3(b)(1), unless the authorization is terminated or revoked.  Performed at [facility], [place] [address], [place], [place] [address]   Blood culture (routine x 2)     Status: None (Preliminary result)   Collection Time: 07/07/21 11:29 AM   Specimen: BLOOD  Result Value Ref Range Status   Specimen Description BLOOD SITE NOT SPECIFIED  Final   Special Requests   Final    BOTTLES DRAWN AEROBIC AND ANAEROBIC Blood Culture results may not be  optimal due to an excessive volume of blood received in culture bottles   Culture   Final    NO GROWTH 4 DAYS Performed at [facility], [place] [address] [place], [place] [address]    Report Status PENDING  Incomplete  Blood culture (routine x 2)     Status: None (Preliminary result)   Collection Time: 07/07/21 11:36 AM   Specimen: BLOOD  Result Value Ref Range Status   Specimen Description BLOOD SITE NOT SPECIFIED  Final   Special Requests   Final    BOTTLES DRAWN AEROBIC AND ANAEROBIC Blood Culture results may not be optimal due to an excessive volume of blood received in culture bottles   Culture   Final  NO GROWTH 4 DAYS Performed at [facility], [place] [address], [place],  [address]    Report Status PENDING  Incomplete     Time coordinating discharge: Over 30 minutes  SIGNED:   Darliss Cheney, MD  [facility] 07/11/2021, 5:32 PM  If 7PM-7AM, please contact night-coverage [email or web address]

## 2021-07-11 NOTE — Progress Notes (Signed)
Patient leaving hospital AMA. MD notified. Marcille Blanco RN

## 2021-07-11 NOTE — Progress Notes (Signed)
PROGRESS NOTE    Gerald [name]  MOQ:[id number] DOB: Mar 01, 1981 DOA: 07/07/2021 PCP: Patient, [name] (Inactive)   Brief Narrative:  Gerald [name] is a 40 y.o. male with medical history significant of chronic combined CHF; stage 3b CKD; afib s/p DCCV on Xarelto; HTN; polysubstance abuse; and morbid obesity presenting with .  He was last admitted from 10/13-17 for acute on chronic combined CHF associated with medication noncompliance and ongoing tobacco and cocaine use.   He was doing better but did not follow up as directed and ran out of medications "because I was hard headed".   Slowly improving.   Main issue is Acute on chronic combined systolic and diastolic CHF due to cocaine abuse and medication non-compliance   Assessment & Plan:   Principal Problem:   Acute on chronic combined systolic and diastolic CHF (congestive heart failure) ([name]) Active Problems:   Community acquired pneumonia   Essential hypertension   PAF (paroxysmal atrial fibrillation) ([name])   Hypokalemia   CKD (chronic kidney disease), stage III ([name])   Polysubstance abuse ([place])   Obesity, Class III, BMI 40-49.9 (morbid obesity) ([place])   Hemoptysis   Restless leg  Acute on chronic combined systolic and diastolic CHF: Echo in June 2022 showed ejection fraction of 30 to 35% with some diastolic dysfunction. Patient presenting with hemoptysis and SOB. He recently ran out of medications again, long history of noncompliance. -He did have evidence of end organ failure, with elevated troponin, -CXR consistent with likely asymmetric edema; underlying infiltrate is a consideration, patient has been continued on Rocephin and Zithromax by prior hospitalist and plan was to continue for 5 days however procalcitonin unremarkable, argues against bacterial pneumonia, I will discontinue antibiotics.   He was weaned off of nitroglycerin drip.  Was on Lasix IV twice daily and has now been transitioned to oral torsemide which is  being increased from 20 mg daily to 40 mg daily now by cardiology.  On Aldactone as well.   Restless leg: Low iron level, this was replaced.  He had no complaints today.   Hemoptysis: Likely due to CHF in the setting of cocaine use.  He has not had any episode since admission.  Xarelto has been resumed by cardiology now.  Obesity, Class III, BMI 40-49.9 (morbid obesity) ([name]) Estimated body mass index is 41.33 kg/m as calculated from the following:   Height as of this encounter: 5\' 11"  (1.803 m).   Weight as of this encounter: 134.4 kg.   Polysubstance abuse ([place]) -He acknowledges ongoing marijuana and cocaine use and acknowledges that he will die if he continues to use -social work consult   CKD stage IIIa: At baseline.   Hypokalemia: Resolved.   PAF (paroxysmal atrial fibrillation) ([name]) Appears to no longer appear to be taking Amiodarone -Rate controlled with Coreg: Xarelto resumed by cardiology.  Essential hypertension: Is still diastolic significantly elevated over 100, will increase amlodipine to 10 mg, continue current dose of aldactone,, hydralazine Coreg, Imdur has been increased to 120 mg.  Continue as needed hydralazine.  Torsemide also increased to 40 today.  Right upper extremity pain and swelling/thrombophlebitis: Doppler negative for DVT.  Mild thrombophlebitis but he has no complaints today.  I advised him to keep it elevated and ask for cold compresses if he needs something for relief.  DVT prophylaxis: Place and maintain sequential compression device Start: 07/07/21 1330, Xarelto resumed 07/10/2021.   Code Status: Prior  Family Communication:  None present at bedside.  Plan of care discussed with patient in length and he verbalized understanding and agreed with it.  Status is: Inpatient  Remains inpatient appropriate because: Needs further management of hypertensive urgency.   Estimated body mass index is 41.12 kg/m as calculated from the following:   Height  as of this encounter: 5\' 11"  (1.803 m).   Weight as of this encounter: 133.7 kg.     Nutritional Assessment: Body mass index is 41.12 kg/m.Marland Kitchen Seen by dietician.  I agree with the assessment and plan as outlined below: Nutrition Status:        .  Skin Assessment: I have examined the patient's skin and I agree with the wound assessment as performed by the wound care RN as outlined below:    Consultants:  Cardiology  Procedures:  None  Antimicrobials:  Anti-infectives (From admission, onward)    Start     Dose/Rate Route Frequency Ordered Stop   07/08/21 1200  azithromycin (ZITHROMAX) 500 mg in sodium chloride 0.9 % 250 mL IVPB        500 mg 250 mL/hr over 60 Minutes Intravenous Every 24 hours 07/07/21 1324     07/08/21 1200  cefTRIAXone (ROCEPHIN) 1 g in sodium chloride 0.9 % 100 mL IVPB        1 g 200 mL/hr over 30 Minutes Intravenous Every 24 hours 07/07/21 1324     07/07/21 1115  cefTRIAXone (ROCEPHIN) 1 g in sodium chloride 0.9 % 100 mL IVPB        1 g 200 mL/hr over 30 Minutes Intravenous  Once 07/07/21 1111 07/07/21 1212   07/07/21 1115  azithromycin (ZITHROMAX) 500 mg in sodium chloride 0.9 % 250 mL IVPB        500 mg 250 mL/hr over 60 Minutes Intravenous  Once 07/07/21 1111 07/07/21 1313          Subjective:  Patient seen and examined.  He has no complaints.  Objective: Vitals:   07/10/21 1301 07/10/21 1956 07/11/21 0336 07/11/21 0946  BP: 113/75 (!) 145/97 (!) 163/107 122/77  Pulse: 78 81 81   Resp: 20 16 14    Temp: (!) 97.3 F (36.3 C) (!) 97.5 F (36.4 C) 97.6 F (36.4 C)   TempSrc: Oral Oral Oral   SpO2: 99% 100% 100%   Weight:   133.7 kg   Height:        Intake/Output Summary (Last 24 hours) at 07/11/2021 1125 Last data filed at 07/11/2021 0400 Gross per 24 hour  Intake 1090.99 ml  Output 925 ml  Net 165.99 ml    Filed Weights   07/09/21 0338 07/10/21 0445 07/11/21 0336  Weight: 134.4 kg 134.8 kg 133.7 kg     Examination:   General exam: Appears calm and comfortable, morbidly obese Respiratory system: Clear to auscultation. Respiratory effort normal. Cardiovascular system: S1 & S2 heard, RRR. No JVD, murmurs, rubs, gallops or clicks. No pedal edema. Gastrointestinal system: Abdomen is nondistended, soft and nontender. No organomegaly or masses felt. Normal bowel sounds heard. Central nervous system: Alert and oriented. No focal neurological deficits. Extremities: Symmetric 5 x 5 power. Skin: No rashes, lesions or ulcers.  Psychiatry: Judgement and insight appear normal. Mood & affect appropriate.   Data Reviewed: I have personally reviewed following labs and imaging studies  CBC: Recent Labs  Lab 07/07/21 0430 07/08/21 0152 07/09/21 0252  WBC 11.5* 7.6 6.3  HGB 11.7* 11.2* 11.6*  HCT 39.8 37.5* 38.3*  MCV 72.2* 71.3* 71.1*  PLT 206 183  338    Basic Metabolic Panel: Recent Labs  Lab 07/07/21 0430 07/07/21 0900 07/08/21 0152 07/09/21 0252 07/10/21 0112 07/11/21 0231  NA 139  --  140 137 138 137  K 3.3*  --  3.0* 3.2* 3.8 4.2  CL 107  --  103 106 107 106  CO2 21*  --  24 23 22 22   GLUCOSE 113*  --  103* 112* 134* 91  BUN 33*  --  29* 29* 24* 24*  CREATININE 1.98*  --  1.97* 1.93* 1.84* 1.83*  CALCIUM 9.1  --  8.8* 8.9 9.3 9.4  MG  --  2.1  --   --  2.3  --     GFR: Estimated Creatinine Clearance: 74.9 mL/min (A) (by C-G formula based on SCr of 1.83 mg/dL (H)). Liver Function Tests: Recent Labs  Lab 07/07/21 0900  AST 31  ALT 38  ALKPHOS 68  BILITOT 1.1  PROT 7.1  ALBUMIN 3.6    No results for input(s): LIPASE, AMYLASE in the last 168 hours. No results for input(s): AMMONIA in the last 168 hours. Coagulation Profile: No results for input(s): INR, PROTIME in the last 168 hours. Cardiac Enzymes: No results for input(s): CKTOTAL, CKMB, CKMBINDEX, TROPONINI in the last 168 hours. BNP (last 3 results) No results for input(s): PROBNP in the last 8760  hours. HbA1C: No results for input(s): HGBA1C in the last 72 hours. CBG: No results for input(s): GLUCAP in the last 168 hours. Lipid Profile: No results for input(s): CHOL, HDL, LDLCALC, TRIG, CHOLHDL, LDLDIRECT in the last 72 hours. Thyroid Function Tests: No results for input(s): TSH, T4TOTAL, FREET4, T3FREE, THYROIDAB in the last 72 hours. Anemia Panel: No results for input(s): VITAMINB12, FOLATE, FERRITIN, TIBC, IRON, RETICCTPCT in the last 72 hours.  Sepsis Labs: Recent Labs  Lab 07/07/21 1132 07/07/21 1512 07/10/21 0112  PROCALCITON  --   --  0.10  LATICACIDVEN 0.8 0.9  --      Recent Results (from the past 240 hour(s))  Resp Panel by RT-PCR (Flu A&B, Covid) Nasopharyngeal Swab     Status: None   Collection Time: 07/07/21  9:15 AM   Specimen: Nasopharyngeal Swab; Nasopharyngeal(NP) swabs in vial transport medium  Result Value Ref Range Status   SARS Coronavirus 2 by RT PCR NEGATIVE NEGATIVE Final    Comment: (NOTE) SARS-CoV-2 target nucleic acids are NOT DETECTED.  The SARS-CoV-2 RNA is generally detectable in upper respiratory specimens during the acute phase of infection. The lowest concentration of SARS-CoV-2 viral copies this assay can detect is 138 copies/mL. A negative result does not preclude SARS-Cov-2 infection and should not be used as the sole basis for treatment or other patient management decisions. A negative result may occur with  improper specimen collection/handling, submission of specimen other than nasopharyngeal swab, presence of viral mutation(s) within the areas targeted by this assay, and inadequate number of viral copies(<138 copies/mL). A negative result must be combined with clinical observations, patient history, and epidemiological information. The expected result is Negative.  Fact Sheet for Patients:  EntrepreneurPulse.com.au  Fact Sheet for Healthcare Providers:  IncredibleEmployment.be  This  test is no t yet approved or cleared by the [place] FDA and  has been authorized for detection and/or diagnosis of SARS-CoV-2 by FDA under an Emergency Use Authorization (EUA). This EUA will remain  in effect (meaning this test can be used) for the duration of the COVID-19 declaration under Section 564(b)(1) of the Act, 21 U.S.C.section 360bbb-3(b)(1), unless the  authorization is terminated  or revoked sooner.       Influenza A by PCR NEGATIVE NEGATIVE Final   Influenza B by PCR NEGATIVE NEGATIVE Final    Comment: (NOTE) The Xpert Xpress SARS-CoV-2/FLU/RSV plus assay is intended as an aid in the diagnosis of influenza from Nasopharyngeal swab specimens and should not be used as a sole basis for treatment. Nasal washings and aspirates are unacceptable for Xpert Xpress SARS-CoV-2/FLU/RSV testing.  Fact Sheet for Patients: EntrepreneurPulse.com.au  Fact Sheet for Healthcare Providers: IncredibleEmployment.be  This test is not yet approved or cleared by the [place] FDA and has been authorized for detection and/or diagnosis of SARS-CoV-2 by FDA under an Emergency Use Authorization (EUA). This EUA will remain in effect (meaning this test can be used) for the duration of the COVID-19 declaration under Section 564(b)(1) of the Act, 21 U.S.C. section 360bbb-3(b)(1), unless the authorization is terminated or revoked.  Performed at [facility], [place] [address] [place], [place] [address]   Blood culture (routine x 2)     Status: None (Preliminary result)   Collection Time: 07/07/21 11:29 AM   Specimen: BLOOD  Result Value Ref Range Status   Specimen Description BLOOD SITE NOT SPECIFIED  Final   Special Requests   Final    BOTTLES DRAWN AEROBIC AND ANAEROBIC Blood Culture results may not be optimal due to an excessive volume of blood received in culture bottles   Culture   Final    NO GROWTH 3 DAYS Performed at [place], [place] [address] [place], [place] [address]    Report Status PENDING  Incomplete  Blood culture (routine x 2)     Status: None (Preliminary result)   Collection Time: 07/07/21 11:36 AM   Specimen: BLOOD  Result Value Ref Range Status   Specimen Description BLOOD SITE NOT SPECIFIED  Final   Special Requests   Final    BOTTLES DRAWN AEROBIC AND ANAEROBIC Blood Culture results may not be optimal due to an excessive volume of blood received in culture bottles   Culture   Final    NO GROWTH 3 DAYS Performed at [facility], [place] [address], [place], [place] [address]    Report Status PENDING  Incomplete       Radiology Studies: VAS [place] UPPER EXTREMITY VENOUS DUPLEX  Result Date: 07/11/2021 UPPER VENOUS STUDY  Patient Name:  JANCARLO BIERMANN [name]  Date of Exam:   07/10/2021 Medical Rec #: [id number]         Accession #:    [id number] Date of Birth: [birth year]         Patient Gender: M Patient Age:   65 years Exam Location:  [facility] Procedure:      VAS [place] UPPER EXTREMITY VENOUS DUPLEX Referring Phys: Tyishia Aune --------------------------------------------------------------------------------  Indications: Swelling and pain at recent IV site, right forearm Comparison Study: No prior studies. Performing Technologist: Darlin Coco RDMS, RVT  Examination Guidelines: A complete evaluation includes B-mode imaging, spectral Doppler, color Doppler, and power Doppler as needed of all accessible portions of each vessel. Bilateral testing is considered an integral part of a complete examination. Limited examinations for reoccurring indications may be performed as noted.  Right Findings: +----------+------------+---------+-----------+----------+-------+ RIGHT     CompressiblePhasicitySpontaneousPropertiesSummary +----------+------------+---------+-----------+----------+-------+ IJV           Full       Yes       Yes                       +----------+------------+---------+-----------+----------+-------+  Subclavian    Full       Yes       Yes                      +----------+------------+---------+-----------+----------+-------+ Axillary      Full       Yes       Yes                      +----------+------------+---------+-----------+----------+-------+ Brachial      Full                                          +----------+------------+---------+-----------+----------+-------+ Radial        Full                                          +----------+------------+---------+-----------+----------+-------+ Ulnar         Full                                          +----------+------------+---------+-----------+----------+-------+ Cephalic      None       No        No                Acute  +----------+------------+---------+-----------+----------+-------+ Basilic       Full                                          +----------+------------+---------+-----------+----------+-------+  Left Findings: +----------+------------+---------+-----------+----------+-------+ LEFT      CompressiblePhasicitySpontaneousPropertiesSummary +----------+------------+---------+-----------+----------+-------+ Subclavian    Full       Yes       Yes                      +----------+------------+---------+-----------+----------+-------+  Summary:  Right: No evidence of deep vein thrombosis in the upper extremity. Findings consistent with acute superficial vein thrombosis involving the right cephalic vein at the level of the mid forearm.  Left: No evidence of thrombosis in the subclavian.  *See table(s) above for measurements and observations.  Diagnosing physician: Harold Barban MD Electronically signed by Harold Barban MD on 07/11/2021 at 12:48:53 AM.    Final     Scheduled Meds:  amLODipine  10 mg Oral Daily   carvedilol  25 mg Oral BID WC   docusate sodium  100 mg Oral BID   hydrALAZINE  100 mg Oral Q8H    isosorbide mononitrate  120 mg Oral Daily   nicotine  14 mg Transdermal Daily   potassium chloride  40 mEq Oral BID   rivaroxaban  20 mg Oral Q supper   sodium chloride flush  3 mL Intravenous Q12H   spironolactone  50 mg Oral Daily   torsemide  40 mg Oral Daily   Continuous Infusions:  azithromycin 500 mg (07/10/21 1242)   cefTRIAXone (ROCEPHIN)  IV 1 g (07/10/21 1707)     LOS: 4 days   Time spent: 30 minutes   Darliss Cheney, MD [facility]  07/11/2021, 11:25 AM  Please page via [place]a [place]  and do not message via secure chat for anything urgent. Secure chat can be used for anything non urgent.  How to contact the [facility] Attending or Consulting provider [place] or covering provider during after hours [place], for this patient?  Check the care team in [facility] and look for a) attending/consulting [facility] provider listed and b) the [facility] team listed. Page or secure chat [phone]. Log into [email or web address] and use [place]'s universal password to access. If you do not have the password, please contact the hospital operator. Locate the [facility] provider you are looking for under [facility] and page to a number that you can be directly reached. If you still have difficulty reaching the provider, please page the [facility] (Director on Call) for the Hospitalists listed on amion for assistance.

## 2021-07-12 LAB — CULTURE, BLOOD (ROUTINE X 2)
Culture: NO GROWTH
Culture: NO GROWTH

## 2021-07-15 ENCOUNTER — Telehealth: Payer: Self-pay | Admitting: Nurse Practitioner

## 2021-07-15 ENCOUNTER — Other Ambulatory Visit: Payer: Self-pay

## 2021-07-15 NOTE — Telephone Encounter (Signed)
Attempted to contact patient x3 attempts. Left message for patient to call back to reschedule.

## 2021-07-17 ENCOUNTER — Other Ambulatory Visit (HOSPITAL_COMMUNITY): Payer: Self-pay

## 2021-07-18 ENCOUNTER — Encounter (HOSPITAL_COMMUNITY): Payer: Self-pay

## 2021-07-22 ENCOUNTER — Other Ambulatory Visit (HOSPITAL_COMMUNITY): Payer: Self-pay

## 2021-07-22 ENCOUNTER — Telehealth (HOSPITAL_COMMUNITY): Payer: Self-pay

## 2021-07-22 NOTE — Telephone Encounter (Signed)
Transitions of Care Pharmacy  ° °Call attempted for a pharmacy transitions of care follow-up. HIPAA appropriate voicemail was left with call back information provided.  ° °Call attempt #1. Will follow-up in 2-3 days.  °  °

## 2021-08-01 ENCOUNTER — Telehealth (HOSPITAL_COMMUNITY): Payer: Self-pay | Admitting: Pharmacist

## 2021-08-01 ENCOUNTER — Other Ambulatory Visit (HOSPITAL_COMMUNITY): Payer: Self-pay

## 2021-08-01 NOTE — Telephone Encounter (Signed)
Transitions of Care Pharmacy   Call attempted for a pharmacy transitions of care follow-up. HIPAA appropriate voicemail was left with call back information provided.   Call attempt #2.    Tenna Lacko D. Donneta Romberg, PharmD, BCPS, [place]  [phone]

## 2021-08-23 ENCOUNTER — Other Ambulatory Visit: Payer: Self-pay

## 2021-08-23 ENCOUNTER — Ambulatory Visit: Admission: EM | Admit: 2021-08-23 | Discharge: 2021-08-23 | Discharge status: Against Medical Advice | Payer: Self-pay

## 2021-08-23 ENCOUNTER — Inpatient Hospital Stay: Payer: Self-pay | Admitting: Internal Medicine

## 2021-08-23 NOTE — ED Triage Notes (Signed)
Pt c/o coughing blood, fatigue, malaise, diarrhea   Denies sore throat, nasal congestion, earache, headache, body aches or chills, nausea, vomiting, constipation   Onset ~ Wednesday   Not best historian of medications

## 2021-09-05 ENCOUNTER — Inpatient Hospital Stay (HOSPITAL_COMMUNITY)
Admission: EM | Admit: 2021-09-05 | Discharge: 2021-09-09 | DRG: 280 | Disposition: A | Discharge status: Home / Self Care | Payer: Self-pay | Attending: Internal Medicine | Admitting: Internal Medicine

## 2021-09-05 ENCOUNTER — Encounter (HOSPITAL_COMMUNITY): Payer: Self-pay

## 2021-09-05 ENCOUNTER — Emergency Department (HOSPITAL_COMMUNITY): Payer: Self-pay

## 2021-09-05 ENCOUNTER — Other Ambulatory Visit: Payer: Self-pay

## 2021-09-05 DIAGNOSIS — I160 Hypertensive urgency: Secondary | ICD-10-CM

## 2021-09-05 DIAGNOSIS — Z79899 Other long term (current) drug therapy: Secondary | ICD-10-CM

## 2021-09-05 DIAGNOSIS — Z8249 Family history of ischemic heart disease and other diseases of the circulatory system: Secondary | ICD-10-CM

## 2021-09-05 DIAGNOSIS — I5043 Acute on chronic combined systolic (congestive) and diastolic (congestive) heart failure: Secondary | ICD-10-CM | POA: Diagnosis present

## 2021-09-05 DIAGNOSIS — I428 Other cardiomyopathies: Secondary | ICD-10-CM | POA: Diagnosis present

## 2021-09-05 DIAGNOSIS — G44209 Tension-type headache, unspecified, not intractable: Secondary | ICD-10-CM | POA: Diagnosis not present

## 2021-09-05 DIAGNOSIS — Z6841 Body Mass Index (BMI) 40.0 and over, adult: Secondary | ICD-10-CM

## 2021-09-05 DIAGNOSIS — I130 Hypertensive heart and chronic kidney disease with heart failure and stage 1 through stage 4 chronic kidney disease, or unspecified chronic kidney disease: Principal | ICD-10-CM | POA: Diagnosis present

## 2021-09-05 DIAGNOSIS — F1410 Cocaine abuse, uncomplicated: Secondary | ICD-10-CM | POA: Diagnosis present

## 2021-09-05 DIAGNOSIS — Z23 Encounter for immunization: Secondary | ICD-10-CM

## 2021-09-05 DIAGNOSIS — I509 Heart failure, unspecified: Secondary | ICD-10-CM

## 2021-09-05 DIAGNOSIS — Z20822 Contact with and (suspected) exposure to covid-19: Secondary | ICD-10-CM | POA: Diagnosis present

## 2021-09-05 DIAGNOSIS — F1910 Other psychoactive substance abuse, uncomplicated: Secondary | ICD-10-CM | POA: Diagnosis present

## 2021-09-05 DIAGNOSIS — I21A1 Myocardial infarction type 2: Secondary | ICD-10-CM | POA: Diagnosis present

## 2021-09-05 DIAGNOSIS — I214 Non-ST elevation (NSTEMI) myocardial infarction: Secondary | ICD-10-CM | POA: Diagnosis present

## 2021-09-05 DIAGNOSIS — Z7901 Long term (current) use of anticoagulants: Secondary | ICD-10-CM

## 2021-09-05 DIAGNOSIS — I10 Essential (primary) hypertension: Secondary | ICD-10-CM

## 2021-09-05 DIAGNOSIS — R778 Other specified abnormalities of plasma proteins: Secondary | ICD-10-CM | POA: Diagnosis present

## 2021-09-05 DIAGNOSIS — N1832 Chronic kidney disease, stage 3b: Secondary | ICD-10-CM | POA: Diagnosis present

## 2021-09-05 DIAGNOSIS — I480 Paroxysmal atrial fibrillation: Secondary | ICD-10-CM | POA: Diagnosis present

## 2021-09-05 DIAGNOSIS — R042 Hemoptysis: Secondary | ICD-10-CM | POA: Diagnosis present

## 2021-09-05 DIAGNOSIS — E876 Hypokalemia: Secondary | ICD-10-CM | POA: Diagnosis present

## 2021-09-05 DIAGNOSIS — I4892 Unspecified atrial flutter: Secondary | ICD-10-CM | POA: Diagnosis present

## 2021-09-05 LAB — TROPONIN I (HIGH SENSITIVITY)
Troponin I (High Sensitivity): 903 ng/L (ref <18)
Troponin I (High Sensitivity): 945 ng/L (ref <18)
Troponin I (High Sensitivity): 849 ng/L (ref <18)
Troponin I (High Sensitivity): 1009 ng/L (ref <18)
Troponin I (High Sensitivity): 926 ng/L (ref <18)
Troponin I (High Sensitivity): 876 ng/L (ref <18)

## 2021-09-05 LAB — COMPREHENSIVE METABOLIC PANEL
ALT: 29 U/L (ref 0–44)
AST: 25 U/L (ref 15–41)
Albumin: 3.3 g/dL — ABNORMAL LOW (ref 3.5–5.0)
Alkaline Phosphatase: 58 U/L (ref 38–126)
Anion gap: 9 (ref 5–15)
BUN: 25 mg/dL — ABNORMAL HIGH (ref 6–20)
CO2: 23 mmol/L (ref 22–32)
Calcium: 8.7 mg/dL — ABNORMAL LOW (ref 8.9–10.3)
Chloride: 107 mmol/L (ref 98–111)
Creatinine, Ser: 1.76 mg/dL — ABNORMAL HIGH (ref 0.61–1.24)
GFR, Estimated: 50 mL/min — ABNORMAL LOW (ref >60)
Glucose, Bld: 109 mg/dL — ABNORMAL HIGH (ref 70–99)
Potassium: 3.4 mmol/L — ABNORMAL LOW (ref 3.5–5.1)
Sodium: 139 mmol/L (ref 135–145)
Total Bilirubin: 1 mg/dL (ref 0.3–1.2)
Total Protein: 6.5 g/dL (ref 6.5–8.1)

## 2021-09-05 LAB — BRAIN NATRIURETIC PEPTIDE: B Natriuretic Peptide: 661 pg/mL — ABNORMAL HIGH (ref 0.0–100.0)

## 2021-09-05 LAB — ECHOCARDIOGRAM COMPLETE
AR max vel: 3.93 cm2
AV Peak grad: 7.1 mmHg
Ao pk vel: 1.33 m/s
Height: 72 in
S' Lateral: 5.2 cm
Single Plane A4C EF: 25.7 %
Weight: 4720 oz

## 2021-09-05 LAB — CBC
HCT: 35.6 % — ABNORMAL LOW (ref 39.0–52.0)
Hemoglobin: 11.1 g/dL — ABNORMAL LOW (ref 13.0–17.0)
MCH: 22.1 pg — ABNORMAL LOW (ref 26.0–34.0)
MCHC: 31.2 g/dL (ref 30.0–36.0)
MCV: 70.9 fL — ABNORMAL LOW (ref 80.0–100.0)
Platelets: 309 10*3/uL (ref 150–400)
RBC: 5.02 MIL/uL (ref 4.22–5.81)
RDW: 19.3 % — ABNORMAL HIGH (ref 11.5–15.5)
WBC: 8.8 10*3/uL (ref 4.0–10.5)
nRBC: 0 % (ref 0.0–0.2)

## 2021-09-05 LAB — FERRITIN: Ferritin: 127 ng/mL (ref 24–336)

## 2021-09-05 LAB — RAPID URINE DRUG SCREEN, HOSP PERFORMED
Amphetamines: NOT DETECTED
Barbiturates: NOT DETECTED
Benzodiazepines: NOT DETECTED
Cocaine: NOT DETECTED
Opiates: POSITIVE — AB
Tetrahydrocannabinol: NOT DETECTED

## 2021-09-05 LAB — RETICULOCYTES
Immature Retic Fract: 24.1 % — ABNORMAL HIGH (ref 2.3–15.9)
RBC.: 5.18 MIL/uL (ref 4.22–5.81)
Retic Count, Absolute: 112.9 10*3/uL (ref 19.0–186.0)
Retic Ct Pct: 2.2 % (ref 0.4–3.1)

## 2021-09-05 LAB — PROCALCITONIN: Procalcitonin: <0.1 ng/mL

## 2021-09-05 LAB — MAGNESIUM: Magnesium: 2.1 mg/dL (ref 1.7–2.4)

## 2021-09-05 LAB — PHOSPHORUS: Phosphorus: 4.1 mg/dL (ref 2.5–4.6)

## 2021-09-05 LAB — RESP PANEL BY RT-PCR (FLU A&B, COVID) ARPGX2
Influenza A by PCR: NEGATIVE
Influenza B by PCR: NEGATIVE
SARS Coronavirus 2 by RT PCR: NEGATIVE

## 2021-09-05 MED ORDER — ONDANSETRON HCL 4 MG/2ML IJ SOLN
4.0000 mg | Freq: Once | INTRAMUSCULAR | Status: AC
  Administered 2021-09-05: 4 mg via INTRAVENOUS
  Filled 2021-09-05: qty 2

## 2021-09-05 MED ORDER — ATORVASTATIN CALCIUM 40 MG PO TABS
40.0000 mg | ORAL_TABLET | Freq: Every day | ORAL | Status: DC
Start: 2021-09-05 — End: 2021-09-09
  Administered 2021-09-05 – 2021-09-09 (×5): 40 mg via ORAL
  Filled 2021-09-05 (×5): qty 1

## 2021-09-05 MED ORDER — HYDROMORPHONE HCL 1 MG/ML IJ SOLN
0.5000 mg | Freq: Once | INTRAMUSCULAR | Status: AC
  Administered 2021-09-05: 0.5 mg via INTRAVENOUS
  Filled 2021-09-05: qty 0.5

## 2021-09-05 MED ORDER — RIVAROXABAN 20 MG PO TABS
20.0000 mg | ORAL_TABLET | Freq: Every day | ORAL | Status: DC
  Administered 2021-09-05 – 2021-09-08 (×4): 20 mg via ORAL
  Filled 2021-09-05 (×4): qty 1

## 2021-09-05 MED ORDER — HYDRALAZINE HCL 20 MG/ML IJ SOLN
10.0000 mg | Freq: Once | INTRAMUSCULAR | Status: AC
  Administered 2021-09-05: 10 mg via INTRAVENOUS
  Filled 2021-09-05: qty 1

## 2021-09-05 MED ORDER — ACETAMINOPHEN 650 MG RE SUPP: 650.0000 mg | Freq: Four times a day (QID) | RECTAL | Status: DC | PRN

## 2021-09-05 MED ORDER — LABETALOL HCL 5 MG/ML IV SOLN
10.0000 mg | Freq: Once | INTRAVENOUS | Status: AC
  Administered 2021-09-05: 10 mg via INTRAVENOUS
  Filled 2021-09-05: qty 4

## 2021-09-05 MED ORDER — INFLUENZA VAC SPLIT QUAD 0.5 ML IM SUSY
0.5000 mL | PREFILLED_SYRINGE | INTRAMUSCULAR | Status: AC
  Administered 2021-09-08: 0.5 mL via INTRAMUSCULAR
  Filled 2021-09-05: qty 0.5

## 2021-09-05 MED ORDER — POTASSIUM CHLORIDE CRYS ER 20 MEQ PO TBCR
40.0000 meq | EXTENDED_RELEASE_TABLET | Freq: Two times a day (BID) | ORAL | Status: AC
  Administered 2021-09-05 (×2): 40 meq via ORAL
  Filled 2021-09-05 (×2): qty 2

## 2021-09-05 MED ORDER — EMPAGLIFLOZIN 10 MG PO TABS
10.0000 mg | ORAL_TABLET | Freq: Every day | ORAL | Status: DC
  Administered 2021-09-06 – 2021-09-09 (×4): 10 mg via ORAL
  Filled 2021-09-05 (×4): qty 1

## 2021-09-05 MED ORDER — AMLODIPINE BESYLATE 5 MG PO TABS
5.0000 mg | ORAL_TABLET | Freq: Every day | ORAL | Status: DC
  Administered 2021-09-05: 5 mg via ORAL
  Filled 2021-09-05: qty 1

## 2021-09-05 MED ORDER — OXYCODONE-ACETAMINOPHEN 5-325 MG PO TABS
1.0000 | ORAL_TABLET | Freq: Once | ORAL | Status: AC
  Administered 2021-09-05: 1 via ORAL
  Filled 2021-09-05: qty 1

## 2021-09-05 MED ORDER — HYDRALAZINE HCL 50 MG PO TABS
100.0000 mg | ORAL_TABLET | Freq: Three times a day (TID) | ORAL | Status: DC
  Administered 2021-09-05 – 2021-09-09 (×13): 100 mg via ORAL
  Filled 2021-09-05 (×13): qty 2

## 2021-09-05 MED ORDER — POLYETHYLENE GLYCOL 3350 17 G PO PACK: 17.0000 g | PACK | Freq: Every day | ORAL | Status: DC | PRN

## 2021-09-05 MED ORDER — SODIUM CHLORIDE 0.9% FLUSH
3.0000 mL | Freq: Two times a day (BID) | INTRAVENOUS | Status: DC
  Administered 2021-09-05 – 2021-09-09 (×7): 3 mL via INTRAVENOUS

## 2021-09-05 MED ORDER — LIDOCAINE 5 % EX PTCH
1.0000 | MEDICATED_PATCH | CUTANEOUS | Status: DC
  Administered 2021-09-05: 1 via TRANSDERMAL
  Filled 2021-09-05 (×3): qty 1

## 2021-09-05 MED ORDER — FUROSEMIDE 10 MG/ML IJ SOLN
80.0000 mg | Freq: Once | INTRAMUSCULAR | Status: AC
  Administered 2021-09-05: 80 mg via INTRAVENOUS
  Filled 2021-09-05: qty 8

## 2021-09-05 MED ORDER — ACETAMINOPHEN 325 MG PO TABS
650.0000 mg | ORAL_TABLET | Freq: Four times a day (QID) | ORAL | Status: DC | PRN
  Administered 2021-09-05 – 2021-09-09 (×8): 650 mg via ORAL
  Filled 2021-09-05 (×9): qty 2

## 2021-09-05 MED ORDER — MORPHINE SULFATE (PF) 4 MG/ML IV SOLN
4.0000 mg | Freq: Once | INTRAVENOUS | Status: AC
  Administered 2021-09-05: 4 mg via INTRAVENOUS
  Filled 2021-09-05: qty 1

## 2021-09-05 MED ORDER — ISOSORBIDE MONONITRATE ER 60 MG PO TB24
120.0000 mg | ORAL_TABLET | Freq: Every day | ORAL | Status: DC
  Administered 2021-09-05 – 2021-09-09 (×5): 120 mg via ORAL
  Filled 2021-09-05 (×2): qty 2
  Filled 2021-09-05: qty 4
  Filled 2021-09-05 (×2): qty 2

## 2021-09-05 MED ORDER — ISOSORBIDE MONONITRATE ER 30 MG PO TB24: 120.0000 mg | ORAL_TABLET | Freq: Every day | ORAL | Status: DC

## 2021-09-05 MED ORDER — CARVEDILOL 12.5 MG PO TABS
12.5000 mg | ORAL_TABLET | Freq: Two times a day (BID) | ORAL | Status: DC
  Administered 2021-09-06 – 2021-09-09 (×7): 12.5 mg via ORAL
  Filled 2021-09-05 (×7): qty 1

## 2021-09-05 NOTE — ED Provider Notes (Signed)
[facility] EMERGENCY DEPARTMENT Provider Note   CSN: [id number] Arrival date & time: 09/05/21  5621     History  Chief Complaint  Patient presents with   Chest Pain    Gerald [name] is a 41 y.o. male.  41 year old male who presents to the emerged from today with worsening shortness of breath and chest pain.  Patient states he has had worsening dyspnea for last 3 to 4 days since being out of his fluid pill (torsemide) and his blood pressure medication (carvedilol).  Patient states that he is out of these for a couple weeks.  States he has had this problem before.  He feels like there is fluid on his lungs.  He says his legs are more swollen his abdomen is more swollen than normal and where he often keeps fluid.  No fever.  No productive cough.  Has some blood come out sometimes with this cough. Called EMS, NTG given which helped his chest pain some.    Chest Pain Associated symptoms: cough and shortness of breath       Home Medications Prior to Admission medications   Medication Sig Start Date End Date Taking? Authorizing Provider  amLODipine (NORVASC) 5 MG tablet Take 1 tablet (5 mg total) by mouth daily. 07/11/21 09/05/21 Yes Pahwani, Einar Grad, MD  atorvastatin (LIPITOR) 40 MG tablet Take 1 tablet (40 mg total) by mouth daily. 07/10/21 09/05/21 Yes Pahwani, Einar Grad, MD  Caffeine-Magnesium Salicylate (DIUREX PO) Take 1 tablet by mouth daily as needed (bloating).   Yes [provider]  carvedilol (COREG) 25 MG tablet Take 1 tablet (25 mg total) by mouth 2 (two) times daily with a meal. 07/10/21 09/05/21 Yes Pahwani, Einar Grad, MD  empagliflozin (JARDIANCE) 10 MG TABS tablet Take 1 tablet (10 mg total) by mouth daily. 07/10/21  Yes Darliss Cheney, MD  hydrALAZINE (APRESOLINE) 100 MG tablet Take 1 tablet (100 mg total) by mouth every 8 (eight) hours. 07/10/21 09/05/21 Yes Pahwani, Einar Grad, MD  isosorbide mononitrate (IMDUR) 60 MG 24 hr tablet Take 2 tablets (120 mg total) by  mouth daily. 07/11/21 09/05/21 Yes Pahwani, Einar Grad, MD  potassium chloride (KLOR-CON) 10 MEQ tablet Take 1 tablet (10 mEq total) by mouth daily. 07/10/21 09/05/21 Yes Pahwani, Einar Grad, MD  rivaroxaban (XARELTO) 20 MG TABS tablet Take 1 tablet (20 mg total) by mouth daily with supper. 07/10/21 09/05/21 Yes Pahwani, Einar Grad, MD  spironolactone (ALDACTONE) 50 MG tablet Take 1 tablet (50 mg total) by mouth daily. 07/11/21 09/05/21 Yes Pahwani, Einar Grad, MD  torsemide (DEMADEX) 20 MG tablet Take 1 tablet (20 mg total) by mouth daily. 07/10/21 09/05/21 Yes Darliss Cheney, MD      Allergies    Patient has no known allergies.    Review of Systems   Review of Systems  Respiratory:  Positive for cough and shortness of breath.   Cardiovascular:  Positive for chest pain.   Physical Exam Updated Vital Signs BP (!) 148/106    Pulse (!) 109    Temp 98.4 F (36.9 C) (Oral)    Resp (!) 32    Ht 6' (1.829 m)    Wt 133.8 kg    SpO2 96%    BMI 40.01 kg/m  Physical Exam Vitals and nursing note reviewed.  Constitutional:      Appearance: He is well-developed.  HENT:     Head: Normocephalic and atraumatic.  Cardiovascular:     Rate and Rhythm: Normal rate.  Pulmonary:     Effort:  Pulmonary effort is normal. Tachypnea present. No respiratory distress.     Breath sounds: Decreased breath sounds present.  Chest:     Chest wall: No mass, deformity or tenderness.  Abdominal:     General: There is no distension or abdominal bruit.     Palpations: Abdomen is soft.  Musculoskeletal:        General: Normal range of motion.     Cervical back: Normal range of motion.     Right lower leg: Edema (mild non pitting) present.     Left lower leg: Edema (mild, non pitting) present.  Neurological:     Mental Status: He is alert.    ED Results / Procedures / Treatments   Labs (all labs ordered are listed, but only abnormal results are displayed) Labs Reviewed  CBC - Abnormal; Notable for the following components:      Result  Value   Hemoglobin 11.1 (*)    HCT 35.6 (*)    MCV 70.9 (*)    MCH 22.1 (*)    RDW 19.3 (*)    All other components within normal limits  COMPREHENSIVE METABOLIC PANEL - Abnormal; Notable for the following components:   Potassium 3.4 (*)    Glucose, Bld 109 (*)    BUN 25 (*)    Creatinine, Ser 1.76 (*)    Calcium 8.7 (*)    Albumin 3.3 (*)    GFR, Estimated 50 (*)    All other components within normal limits  BRAIN NATRIURETIC PEPTIDE - Abnormal; Notable for the following components:   B Natriuretic Peptide 661.0 (*)    All other components within normal limits  TROPONIN I (HIGH SENSITIVITY) - Abnormal; Notable for the following components:   Troponin I (High Sensitivity) 903 (*)    All other components within normal limits  RESP PANEL BY RT-PCR (FLU A&B, COVID) [id number]  PROCALCITONIN    EKG EKG Interpretation  Date/Time:  Thursday September 05 2021 05:32:45 EST Ventricular Rate:  110 PR Interval:  105 QRS Duration: 184 QT Interval:  483 QTC Calculation: 654 R Axis:   -57 Text Interpretation: Ectopic atrial tachycardia, unifocal Left bundle branch block similar to previous with possibly wider QRS Confirmed by Merrily Pew [phone]) on 09/05/2021 5:36:18 AM  Radiology DG Chest 2 View  Result Date: 09/05/2021 CLINICAL DATA:  Chest pain EXAM: CHEST - 2 VIEW COMPARISON:  07/07/2021 FINDINGS: Cardiac enlargement is unchanged from previous exam. Multifocal airspace opacities involving the right upper lobe, right middle lobe and right lower lobe concerning for multifocal infection. Mild patchy airspace densities within the perihilar left mid lung also noted. IMPRESSION: Multifocal airspace opacities involving the right upper lobe, right middle lobe and right lower lobe concerning for multifocal infection. Cardiac enlargement. Electronically Signed   By: Kerby Moors M.D.   On: 09/05/2021 06:15    Procedures Procedures    Medications Ordered in ED Medications  furosemide (LASIX)  injection 80 mg (has no administration in time range)  labetalol (NORMODYNE) injection 10 mg (0 mg Intravenous Hold 09/05/21 7939)    ED Course/ Medical Decision Making/ A&P                           Medical Decision Making Amount and/or Complexity of Data Reviewed Labs: ordered. Radiology: ordered.  Risk Prescription drug management.   Suspect fluid overload 2/2 hypertnesaion and noncompliance.   Chest x-ray read as multifocal pneumonia however no fever, no productive cough,  no leukocytosis and elevated BNP and troponin make that unlikely.  We will continue with the previous plan and add on a procalcitonin just to ensure is not infectious.  Troponin elevated but not significantly higher than previous ones.  We will hold on heparin at this time he does not have any pain.  His EKG is left bundle branch with tachycardia but no obvious ST elevation or sgarBosa criteria met.   Care transferred pending reevaluation and second troponin.    Final Clinical Impression(s) / ED Diagnoses Final diagnoses:  None    Rx / DC Orders ED Discharge Orders     None         Trystin Terhune, Corene Cornea, MD 09/05/21 [phone]

## 2021-09-05 NOTE — ED Notes (Signed)
Lab called with critical value: troponin 903. MD notified, waiting for further orders.

## 2021-09-05 NOTE — ED Notes (Signed)
Patient transported to X-ray 

## 2021-09-05 NOTE — ED Provider Notes (Signed)
Pt signed out by Dr. Dayna Barker pending 2nd trop.  Pt says he's been out of his meds.  His CXR was read as multifocal infection.  However, clinically, he is more chf and procalcitonin level is <0.10, so I doubt pna.  Pt is still sob after 80 mg lasix iv and he is still hypertensive despite labetalol and hydralazine.  Last cath in May of 2022 with "very large and normal coronary arteries."  Pt d/w IMTS for admission.   Isla Pence, MD 09/05/21 [phone]

## 2021-09-05 NOTE — Hospital Course (Addendum)
#  Acute on chronic HFrEF #Nonischemic cardiomyopathy CHF likely secondary to severe uncontrolled HTN vs infiltrative cardiomyopathy. Last EF 30-35%, now echo this admission showing EF of 20-25%, severe reduction in LV function and severe LVH, suggestive of infiltrative cardiomyopathy, although suspect this is all related to uncontrolled hypertension. Also showed LV global hypokinesis and grade III diastolic dysfunction. At home, patient is supposed to be on jardiance 10 mg, imdur 120 mg, hydralazine 100 mg tid, torsemide 20 mg, spiro 50 mg, amlopipine 5 mg, and coreg 12.5 mg bid. The patient does not regularly follow with a PCP and usually gets a month supply of these medications after his hospitalizations- these medications were last prescribed in November. Patient's dry weight ~285 lbs and he was up about 10 lbs on admission. He was diuresed with IV lasix 80 mg and had good urine output. Net negative 10.5 L on day of discharge, with no signs of volume overload on exam. Patient was started on losartan 50 mg this admission and resumed on rest of "home" GDMT- discharged with amlodipine 5 mg, coreg 12.5 mg bid, hydralazine 100 mg q8h, imdur 120 mg, torsemide 20 mg, losartan 50 mg, jardiance 10 mg, and spironolactone 50 mg. Patient has follow up appt on 2/2 to establish care with PCP.    #Severe HTN Patient with demand ischemia, with trops peaked at 900. He underwent an ischemic evaluation about 6 months ago, with clear coronaries. Continue home imdur, hydralazine, and amlodipine as noted above. Started on losartan also.   #Paroxysmal Afib on xarelto Patient with previous DCCV and on xarelto therapy. He remained in NSR. Resumed home xarelto.   #CKD 3b  Renal function remained at baseline.   #Tension headache #Polysubstance use Patient endorsing a tension-type headache during hospitalization. He is not on any long-term opioid medications at home for pain. He does have a history of polysubstance use, with  UDS positive for opiates on arrival. Headache improved with Excedrin. Headache could be secondary to imdur, however, discussed risks and benefits with the patient, and he would like to continue taking imdur.

## 2021-09-05 NOTE — ED Notes (Signed)
ED Provider at bedside. 

## 2021-09-05 NOTE — ED Notes (Addendum)
Pt requested and put on 2L [place] for comfort.

## 2021-09-05 NOTE — ED Triage Notes (Signed)
Pt presents to the ED from home via [facility] with complaints of CP, SOB onset several weeks ago. Pt states he has been out of his medications, lasix and others but has been taking his BP meds. Pt given ASA 324mg   and 1 SL NTG en route by EMS with some relief of CP. Pt states he has been coughing up blood for the last 2 weeks but not consistently, when he coughs a lot, the blood comes at the end.

## 2021-09-05 NOTE — Progress Notes (Signed)
Echocardiogram 2D Echocardiogram has been performed.  Jefferey Pica 09/05/2021, 11:05 AM

## 2021-09-05 NOTE — Progress Notes (Incomplete)
{  Select Note:[id number]} 

## 2021-09-05 NOTE — H&P (Signed)
Date: 09/05/2021     Patient Name:  Gerald [name] MRN: [id number]  DOB: [birth year] Age / Sex: 41 y.o., male   PCP: Patient, [name] (Inactive)         Medical Service: Internal Medicine Teaching Service       Attending Physician: Dr. Heber [place], Rachel Moulds, DO    Intern (1st Contact): Buddy Duty, DO Pager: RA [phone]  Resident (2nd Contact): Hadassah Pais, MD Pager: PB [phone]       After Hours (After 5p/  First Contact Pager: [phone]  weekends / holidays): Second Contact Pager: [phone]   SUBJECTIVE:  Chief Complaint: Shortness of breath  History of Present Illness: Gerald [name] is a functionally independent 41 y.o. male with a pertinent PMH of HFrEF, Stage 3b CKD, Afib (on xerelto), HTN, polysubstance use disorder, and obesity, who presents to [facility] with progressive SHOB and hemoptysis.  Patient states that he developed progressive SHOB roughly a week ago with associated productive cough. He states that it is worse when he moves or lays flat and even has symptoms with routine task like clothing himself. He believes that his symptoms are due to running out of his medications after his previous hospitalization back in November (07/11/2021). He has associated orthopnea, PND, and some lower extreity edema. He states that he had some chest pain prior to presentation, but denies chest pain currently. This pain was constant for more than an hour and not worsened or precipitated by physical exertion. He endorsed a one week history of productive cough with blood tinged sputum. He had a previous episode of hemoptysis at his prior admission that resolved without intervention. HE also has a history of polysubstance use, including cocaine use, which is the presumed etiology of his cardiomyopathy. However, the patient states that he has not used cocaine or any other substance in roughly 2 weeks. He denies any fevers, chills, myalgias, syncope/presyncope, nausea, emesis, sock contacts, to recent  travel.    Medications: No current facility-administered medications on file prior to encounter.   Current Outpatient Medications on File Prior to Encounter  Medication Sig Dispense Refill   amLODipine (NORVASC) 5 MG tablet Take 1 tablet (5 mg total) by mouth daily. 30 tablet 0   atorvastatin (LIPITOR) 40 MG tablet Take 1 tablet (40 mg total) by mouth daily. 30 tablet 0   Caffeine-Magnesium Salicylate (DIUREX PO) Take 1 tablet by mouth daily as needed (bloating).     carvedilol (COREG) 25 MG tablet Take 1 tablet (25 mg total) by mouth 2 (two) times daily with a meal. 60 tablet 0   empagliflozin (JARDIANCE) 10 MG TABS tablet Take 1 tablet (10 mg total) by mouth daily. 30 tablet 0   hydrALAZINE (APRESOLINE) 100 MG tablet Take 1 tablet (100 mg total) by mouth every 8 (eight) hours. 90 tablet 0   isosorbide mononitrate (IMDUR) 60 MG 24 hr tablet Take 2 tablets (120 mg total) by mouth daily. 60 tablet 0   potassium chloride (KLOR-CON) 10 MEQ tablet Take 1 tablet (10 mEq total) by mouth daily. 30 tablet 0   rivaroxaban (XARELTO) 20 MG TABS tablet Take 1 tablet (20 mg total) by mouth daily with supper. 30 tablet 0   spironolactone (ALDACTONE) 50 MG tablet Take 1 tablet (50 mg total) by mouth daily. 30 tablet 0   torsemide (DEMADEX) 20 MG tablet Take 1 tablet (20 mg total) by mouth daily. 30 tablet 0    Past Medical History: Past Medical History:  Diagnosis Date   Atrial flutter, paroxysmal ([name])    Chronic combined systolic and diastolic CHF (congestive heart failure) ([name])    Chronic kidney disease, stage III (moderate) ([name])    Dysrhythmia    brief episode afib, cardioverted did not return   Hypertension    Polysubstance abuse [facility])     Social:  Lives - [address] [place],  [place] [id number],   Support - unknown, Level of function: Independent Physical - unkown ADLs - independent  IADLs - independent  Occupation - unknown PCP - Patient, [name] (Inactive),  Substance  use: Illicit - marijuana and cocaine  ETOH -  unknown Tobacco - 15 pack year history   Family History: Family History  Problem Relation Age of Onset   Hypertension Father    Heart disease Paternal Grandfather     Allergies: Allergies as of 09/05/2021   (No Known Allergies)    Review of Systems: A complete ROS was negative except as per HPI.   OBJECTIVE:  Physical Exam: Blood pressure (!) 150/103, pulse 89, temperature 98.4 F (36.9 C), temperature source Oral, resp. rate 20, height 6' (1.829 m), weight 133.8 kg, SpO2 98 %. Physical Exam Constitutional:      General: He is not in acute distress.    Appearance: He is obese.  HENT:     Head: Normocephalic and atraumatic.     Mouth/Throat:     Mouth: Mucous membranes are moist.     Pharynx: Oropharynx is clear. No oropharyngeal exudate.  Eyes:     Extraocular Movements: Extraocular movements intact.  Neck:     Vascular: JVD (minimal) present.  Cardiovascular:     Rate and Rhythm: Normal rate and regular rhythm.     Pulses: Normal pulses.     Heart sounds:    Gallop present. S4 sounds present.  Pulmonary:     Effort: Pulmonary effort is normal.     Breath sounds: Rales (bilateral bases (R>L)) present.  Abdominal:     General: There is distension.     Tenderness: There is no abdominal tenderness.  Musculoskeletal:        General: Swelling (minimal bilateral lower extremity pitting edema) present. Normal range of motion.  Skin:    General: Skin is warm and dry.  Neurological:     General: No focal deficit present.     Mental Status: He is alert. Mental status is at baseline.    Pertinent Labs: CBC    Component Value Date/Time   WBC 8.8 09/05/2021 0532   RBC 5.02 09/05/2021 0532   HGB 11.1 (L) 09/05/2021 0532   HCT 35.6 (L) 09/05/2021 0532   PLT 309 09/05/2021 0532   MCV 70.9 (L) 09/05/2021 0532   MCH 22.1 (L) 09/05/2021 0532   MCHC 31.2 09/05/2021 0532   RDW 19.3 (H) 09/05/2021 0532   LYMPHSABS 1.6  05/30/2021 0648   MONOABS 0.6 05/30/2021 0648   EOSABS 0.0 05/30/2021 0648   BASOSABS 0.0 05/30/2021 0648     CMP     Component Value Date/Time   NA 139 09/05/2021 0532   K 3.4 (L) 09/05/2021 0532   CL 107 09/05/2021 0532   CO2 23 09/05/2021 0532   GLUCOSE 109 (H) 09/05/2021 0532   BUN 25 (H) 09/05/2021 0532   CREATININE 1.76 (H) 09/05/2021 0532   CALCIUM 8.7 (L) 09/05/2021 0532   PROT 6.5 09/05/2021 0532   ALBUMIN 3.3 (L) 09/05/2021 0532   AST 25 09/05/2021 0532   ALT  29 09/05/2021 0532   ALKPHOS 58 09/05/2021 0532   BILITOT 1.0 09/05/2021 0532   GFRNONAA 50 (L) 09/05/2021 0532   GFRAA >60 04/08/2019 1442    Pertinent Imaging: DG Chest 2 View  Result Date: 09/05/2021  Multifocal airspace opacities involving the right upper lobe, right middle lobe and right lower lobe concerning for multifocal infection. Cardiac enlargement.  EKG: unchanged from previous tracings, sinus tachycardia, LBBB  ASSESSMENT & PLAN:  Patient Summary: JAYMIAN BOGART [name] is a functionally independent 41 y.o. male with a pertinent PMH of HFrEF, Stage 3b CKD, Afib (on xerelto), HTN, polysubstance use disorder, and obesity, who presents to [facility] with progressive SHOB and hemoptysis and admitted for severe symptomatic HTN complicated by acute exacerbation of CHF.  Assessment: Principal Problem:   Severe uncontrolled hypertension Active Problems:   NSTEMI (non-ST elevated myocardial infarction) ([name])   Acute on chronic combined systolic and diastolic CHF (congestive heart failure) ([name])   Atrial flutter ([name])   Elevated troponin   Hypokalemia   Polysubstance abuse ([place])   Hemoptysis   Plan: #NSTEMI  #Severe, Symptomatic HTN: Likely type 2 NSTEMI from severe HTN. EKG unchanged and trops peaked at 900. Patient had an ischemic evaluation roughly 6 months ago with clear coronary arteries.  - Restarted home medications for BP including Imdur, hydralazine, and amlodipine.  #HFrEF (EF30-35%) Patient has  a history of combined CHF with an EF of 30-35% and mild LV dilation and severe hypertrophy. RV showed moderate thickness with normal function. Recent CTA shows enlarged pulmonary artery concerning for pulmonary HTN. Patient had a previous ischemic evaluation on 01/2021 with normal coronary arteries. Patient CHF was presumably 2/2 to uncontrolled HTN in the setting of cocaine use. However, patient does have persistent airspace disease that has not been fully worked up and could be contributing to his pulmonary HTN. Patients dry weight appears to be 285lbs and he is currently 295 lbs today. Patient has been given 80 mg IV furosemide and endorses good urine output. - Continue furosemide 80 mg IV daily.  - Strict INO and daily weights.  - Restart GDMT when possible.  - Monitor electrolytes daily - Repeat TEE and consult cardio if significantly worse.  #PAF Hisotry of PAF with previous DCCV on xerelto. Will restart today. Has a hisotry of cocaine use and therefore will hold off on BB until UDS returns. - Restart xerelto  - Hold BB until UDS results  #CKD (Stage 3b) - At baseline - monitor kidney function daily - Avoid nephrotoxic medications when possible.   #Microcytic Anemia: -- Ferritin and reticulocyte count pending  - Give Iron if needed.   #Prediabetes: - CBG monitoring - Hgb A1c ordered  Polysubstance-use Disorder  - UDS ordered   #Hypokalemia: -Replete potassium as needed  #Hemoptysis:  Possible from cocaine use. No sign of hemotypsis on evaluation today.   Best Practice: Diet: Cardiac diet IVF: none VTE: rivaroxaban (XARELTO) tablet 20 mg Start: 09/05/21 1700Xerelto Code: Full Code AB: none Dispo: Inpatient with expected length of stay greater than 2 midnights. Anticipated Discharge Location: Home Barriers to Discharge: Medical stability Family Contact: unknown  Signature: Lawerance Cruel, D.O.  Internal Medicine Resident, PGY-3 [facility]  Pager: [phone] 2:33 PM, 09/05/2021   Please contact the on call pager after 5 pm and on weekends at [phone].

## 2021-09-06 LAB — COMPREHENSIVE METABOLIC PANEL
ALT: 27 U/L (ref 0–44)
AST: 23 U/L (ref 15–41)
Albumin: 3 g/dL — ABNORMAL LOW (ref 3.5–5.0)
Alkaline Phosphatase: 55 U/L (ref 38–126)
Anion gap: 7 (ref 5–15)
BUN: 27 mg/dL — ABNORMAL HIGH (ref 6–20)
CO2: 25 mmol/L (ref 22–32)
Calcium: 8.4 mg/dL — ABNORMAL LOW (ref 8.9–10.3)
Chloride: 103 mmol/L (ref 98–111)
Creatinine, Ser: 1.99 mg/dL — ABNORMAL HIGH (ref 0.61–1.24)
GFR, Estimated: 43 mL/min — ABNORMAL LOW (ref >60)
Glucose, Bld: 108 mg/dL — ABNORMAL HIGH (ref 70–99)
Potassium: 3.7 mmol/L (ref 3.5–5.1)
Sodium: 135 mmol/L (ref 135–145)
Total Bilirubin: 0.8 mg/dL (ref 0.3–1.2)
Total Protein: 6.3 g/dL — ABNORMAL LOW (ref 6.5–8.1)

## 2021-09-06 LAB — CBC
HCT: 34.3 % — ABNORMAL LOW (ref 39.0–52.0)
Hemoglobin: 10.4 g/dL — ABNORMAL LOW (ref 13.0–17.0)
MCH: 21.6 pg — ABNORMAL LOW (ref 26.0–34.0)
MCHC: 30.3 g/dL (ref 30.0–36.0)
MCV: 71.2 fL — ABNORMAL LOW (ref 80.0–100.0)
Platelets: 324 10*3/uL (ref 150–400)
RBC: 4.82 MIL/uL (ref 4.22–5.81)
RDW: 19.2 % — ABNORMAL HIGH (ref 11.5–15.5)
WBC: 6.8 10*3/uL (ref 4.0–10.5)
nRBC: 0 % (ref 0.0–0.2)

## 2021-09-06 LAB — IRON AND TIBC
Iron: 29 ug/dL — ABNORMAL LOW (ref 45–182)
Saturation Ratios: 10 % — ABNORMAL LOW (ref 17.9–39.5)
TIBC: 281 ug/dL (ref 250–450)
UIBC: 252 ug/dL

## 2021-09-06 LAB — HEMOGLOBIN A1C
Hgb A1c MFr Bld: 6.1 % — ABNORMAL HIGH (ref 4.8–5.6)
Mean Plasma Glucose: 128 mg/dL

## 2021-09-06 MED ORDER — LOSARTAN POTASSIUM 50 MG PO TABS
50.0000 mg | ORAL_TABLET | Freq: Every day | ORAL | Status: DC
  Administered 2021-09-06 – 2021-09-09 (×4): 50 mg via ORAL
  Filled 2021-09-06 (×4): qty 1

## 2021-09-06 MED ORDER — FUROSEMIDE 10 MG/ML IJ SOLN
80.0000 mg | Freq: Once | INTRAMUSCULAR | Status: AC
  Administered 2021-09-06: 80 mg via INTRAVENOUS
  Filled 2021-09-06: qty 8

## 2021-09-06 MED ORDER — SPIRONOLACTONE 25 MG PO TABS
50.0000 mg | ORAL_TABLET | Freq: Every day | ORAL | Status: DC
  Administered 2021-09-06 – 2021-09-09 (×4): 50 mg via ORAL
  Filled 2021-09-06 (×4): qty 2

## 2021-09-06 MED ORDER — TRAMADOL HCL 50 MG PO TABS
50.0000 mg | ORAL_TABLET | Freq: Once | ORAL | Status: AC
  Administered 2021-09-06: 50 mg via ORAL
  Filled 2021-09-06: qty 1

## 2021-09-06 MED ORDER — ASPIRIN-ACETAMINOPHEN-CAFFEINE 250-250-65 MG PO TABS
1.0000 | ORAL_TABLET | Freq: Once | ORAL | Status: AC
  Administered 2021-09-06: 1 via ORAL
  Filled 2021-09-06 (×2): qty 1

## 2021-09-06 NOTE — Progress Notes (Addendum)
Heart Failure Nurse Navigator Progress Note  Attempted to assess for HV TOC readiness. Pt has had 2 prior HV TOC clinic appts scheduled for 06/12/21 and 07/18/21--pt no call, no show. Will attempt to get patient assistance as he has had 4 HF related hospitalizations in the past 6 months. Pt is showing no insurance--previous admission had [facility].   Pt currently in restroom, HR 106.  Will attempt to interview later.   Pricilla Holm, MSN, RN Heart Failure Nurse Navigator [phone]  UPDATE: [phone]  Heart Failure Nurse Navigator Progress Note  PCP: Patient, [name] (Inactive) PCP-Cardiologist: none Admission Diagnosis: severe HTN Admitted from: home   Presentation:   Gerald [name] presented 1/19 with severe uncontrolled HTN. Pt ran out of medications. Patient interactive with interview process. Pt resting in bed on room air with lights off, c/o headache.  Explained benefits of Heart & Vascular Transitions of Care Clinic appointment, patient agreeable. Pt has had 2 other appointments scheduled with HV TOC clinic and no showed. Explained this will be the last appointment. Drives reliable vehicle.   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 Marijuana last use 11/18, several times a week.   Sexual activity: Yes    Partners: Female    Birth control/protection: Condom  Other Topics Concern   Not on file  Social History Narrative   Not on file   Social Determinants of Health   Financial Resource Strain: High Risk   Difficulty of Paying Living Expenses: Hard  Food Insecurity: No Food Insecurity   Worried About Charity fundraiser in the Last Year: Never true   Ran Out of Food in the Last Year: Never true  Transportation Needs: No Transportation Needs   Lack of Transportation (Medical): No   Lack of Transportation (Non-Medical): No  Physical Activity: Not on file  Stress: Not on file  Social Connections: Not on file    High Risk Criteria for Readmission and/or Poor Patient Outcomes: Heart failure hospital admissions (last 6 months): 4  No Show rate: 32% Difficult social situation: yes Demonstrates medication adherence: NO Primary Language: English Literacy level: able to read/write and comprehend  Education Assessment and Provision:  Detailed education and instructions provided on heart failure disease management including the following:  Signs and symptoms of Heart Failure When to call the physician Importance of daily weights Low sodium diet Fluid restriction Medication management Anticipated future follow-up appointments  Patient education given on each of the above topics.  Patient acknowledges understanding via teach back method and acceptance of all instructions.  Education Materials:  "Living Better With Heart Failure" Booklet, HF zone tool, & Daily Weight Tracker  Tool.  Patient has scale at home: yes Patient has pill box at home: yes   Barriers of Care:   -compliance -financial strain  Considerations/Referrals:   Referral made to Heart Failure Pharmacist Stewardship: no Referral made to Heart Failure CSW/NCM TOC: no Referral made to Heart & Vascular TOC clinic: yes, Tuesday 1/24 @ 3pm  Items for Follow-up on DC/TOC: -optimize -compliance -medication cost -lack of insurance -no income   Pricilla Holm, MSN, RN Heart Failure Nurse Navigator [phone]

## 2021-09-06 NOTE — Progress Notes (Signed)
HD#1 SUBJECTIVE:  Patient Summary: Gerald [name] is a 41 y.o. with a pertinent PMH of HFrEF, CKD 3b, Afib, HTN, polysubstance use disorder, and obesity, who presented with SOB and hemoptysis and admitted for severe symptomatic hypertension.   Overnight Events: No acute events overnight  Interim History: This is hospital day 1 for this patient who was seen and evaluated at the bedside. He states that his breathing feels improved, but that he has a headache today. He has been able to get up and use the bathroom on his own.   OBJECTIVE:  Vital Signs: Vitals:   09/05/21 1706 09/05/21 2056 09/06/21 0014 09/06/21 0357  BP: (!) 150/100 (!) 156/92 (!) 159/104 (!) 166/114  Pulse:  100 94 95  Resp:  20 20 20   Temp: 98 F (36.7 C) 98.4 F (36.9 C) 98.5 F (36.9 C) 98.9 F (37.2 C)  TempSrc: Oral Oral Oral Oral  SpO2:  92% 94% 94%  Weight:    135.9 kg  Height:       Supplemental O2: Room Air SpO2: 94 % O2 Flow Rate (L/min): 2 L/min  Filed Weights   09/05/21 0541 09/06/21 0357  Weight: 133.8 kg 135.9 kg     Intake/Output Summary (Last 24 hours) at 09/06/2021 0619 Last data filed at 09/06/2021 0001 Gross per 24 hour  Intake 480 ml  Output 3950 ml  Net -3470 ml   Net IO Since Admission: -3,470 mL [09/06/21 0619]  Physical Exam: General: Resting comfortably in bed. No acute distress. CV: RRR. No murmurs appreciated. Trace LE edema Pulmonary: Bibasilar rales appreciated. Normal work of breathing of room air.  Abdominal: Soft, nontender, nondistended.  Extremities: Normal bulk and tone Skin: Warm and dry.  Neuro: A&Ox3. No focal deficit. Psych: Normal mood and affect    ASSESSMENT/PLAN:  Assessment: Principal Problem:   Severe uncontrolled hypertension Active Problems:   NSTEMI (non-ST elevated myocardial infarction) ([name])   Acute on chronic combined systolic and diastolic CHF (congestive heart failure) ([name])   Atrial flutter ([name])   Elevated troponin    Hypokalemia   Polysubstance abuse ([name])   Hemoptysis   Plan: #Acute on chronic HFrEF #Nonischemic cardiomyopathy CHF likely secondary to severe uncontrolled HTN vs infiltrative cardiomyopathy. Last EF 30-35%, now echo this admission showing EF of 20-25%, severe reduction in LV function and severe LVH, suggestive of infiltrative cardiomyopathy. Also showed LV global hypokinesis and grade III diastolic dysfunction. At home, patient is on jardiance 10 mg, imdur 120 mg, hydralazine 100 mg tid, torsemide 20 mg, spiro 50 mg, amlopipine 5 mg, and coreg 12.5 mg bid. Patient's dry weight ~285 lbs and he was up about 10 lbs on admission. He was give IV lasix 80 mg yesterday and had ~4 L UOP, although weight unchanged. Patient remains volume overloaded on exam. - IV lasix 80 mg today - Strict I&Os and daily weights - Cardiac tele - Continue home amlodipine, jardiance, hydralazine, and imdur - Holding home torsemide given IV diuresis today - Holding home coreg given severely reduced EF and ongoing cocaine use - Start losartan 50 mg daily   #Severe HTN Patient with demand ischemia yesterday, trops peaked at 900. He underwent an ischemic evaluation about 6 months ago, with clear coronaries. Continue home imdur, hydralazine, and amlodipine as noted above. - Losartan started   #Paroxysmal Afib on xarelto Patient with previous DCCV and on xarelto therapy. He remains in NSR on evaluation today. Resumed home xarelto.   #CKD 3b  Cr 1.99  today with GFR of 43. Renal function appears to be at baseline. - Daily BMP - Avoid nephrotoxins  #Headache Patient endorsing a tension-type headache this morning. Tylenol did not help the patient much. - Excedrin   Best Practice: Diet: Cardiac diet IVF: Fluids: none VTE: Xarelto Code: Full AB: none Therapy Recs: None DISPO: Anticipated discharge in 1-3 days days to Home pending Medical stability.  Signature: Buddy Duty, D.O.  Internal Medicine Resident,  PGY-1 [facility]  Pager: [phone] 6:19 AM, 09/06/2021   Please contact the on call pager after 5 pm and on weekends at [phone].

## 2021-09-06 NOTE — Progress Notes (Signed)
Patient complaining pain mostly on his head. He said that tylenol doesn't do anything then patient begun  to make a fuss about it. He took the tylenol anyway. Paged DR Posey Pronto and got one time dose of  tramadol just for tonight.

## 2021-09-06 NOTE — TOC Progression Note (Addendum)
Transition of Care [facility]) - Progression Note    Patient Details  Name: Gerald [name] MRN: [id number] Date of Birth: 28-Aug-1980  [facility] CM/SW Contact  Zenon Mayo, RN Phone Number: 09/06/2021, 8:19 AM  Clinical Narrative:    From home, with NSTEMI, uncontrolled HTN, aflutter, CHF. Has PSA.  TOC will continue to follow for dc needs. He will need PCP follow up and medication ast.        Expected Discharge Plan and Services                                                 Social Determinants of Health (SDOH) Interventions    Readmission Risk Interventions Readmission Risk Prevention Plan 06/03/2021 04/02/2021  Transportation Screening Complete Complete  Medication Review Press photographer) Complete Complete  PCP or Specialist appointment within 3-5 days of discharge Complete Complete  HRI or [place] Complete Complete  SW Recovery Care/Counseling Consult Complete Complete  Palliative Care Screening Not Applicable Not [place] Not Applicable Not Applicable  Some recent data might be hidden

## 2021-09-07 DIAGNOSIS — I428 Other cardiomyopathies: Secondary | ICD-10-CM

## 2021-09-07 DIAGNOSIS — I130 Hypertensive heart and chronic kidney disease with heart failure and stage 1 through stage 4 chronic kidney disease, or unspecified chronic kidney disease: Principal | ICD-10-CM

## 2021-09-07 DIAGNOSIS — I480 Paroxysmal atrial fibrillation: Secondary | ICD-10-CM

## 2021-09-07 DIAGNOSIS — N1832 Chronic kidney disease, stage 3b: Secondary | ICD-10-CM

## 2021-09-07 DIAGNOSIS — I5043 Acute on chronic combined systolic (congestive) and diastolic (congestive) heart failure: Secondary | ICD-10-CM

## 2021-09-07 DIAGNOSIS — G44209 Tension-type headache, unspecified, not intractable: Secondary | ICD-10-CM

## 2021-09-07 DIAGNOSIS — F1910 Other psychoactive substance abuse, uncomplicated: Secondary | ICD-10-CM

## 2021-09-07 LAB — CBC
HCT: 34.2 % — ABNORMAL LOW (ref 39.0–52.0)
Hemoglobin: 10.5 g/dL — ABNORMAL LOW (ref 13.0–17.0)
MCH: 21.8 pg — ABNORMAL LOW (ref 26.0–34.0)
MCHC: 30.7 g/dL (ref 30.0–36.0)
MCV: 71 fL — ABNORMAL LOW (ref 80.0–100.0)
Platelets: 291 10*3/uL (ref 150–400)
RBC: 4.82 MIL/uL (ref 4.22–5.81)
RDW: 18.7 % — ABNORMAL HIGH (ref 11.5–15.5)
WBC: 6.7 10*3/uL (ref 4.0–10.5)
nRBC: 0 % (ref 0.0–0.2)

## 2021-09-07 LAB — BASIC METABOLIC PANEL
Anion gap: 11 (ref 5–15)
BUN: 26 mg/dL — ABNORMAL HIGH (ref 6–20)
CO2: 23 mmol/L (ref 22–32)
Calcium: 8.6 mg/dL — ABNORMAL LOW (ref 8.9–10.3)
Chloride: 104 mmol/L (ref 98–111)
Creatinine, Ser: 1.93 mg/dL — ABNORMAL HIGH (ref 0.61–1.24)
GFR, Estimated: 44 mL/min — ABNORMAL LOW (ref >60)
Glucose, Bld: 102 mg/dL — ABNORMAL HIGH (ref 70–99)
Potassium: 3.5 mmol/L (ref 3.5–5.1)
Sodium: 138 mmol/L (ref 135–145)

## 2021-09-07 LAB — MAGNESIUM: Magnesium: 2.2 mg/dL (ref 1.7–2.4)

## 2021-09-07 MED ORDER — AMLODIPINE BESYLATE 5 MG PO TABS
5.0000 mg | ORAL_TABLET | Freq: Every day | ORAL | Status: DC
  Administered 2021-09-07 – 2021-09-09 (×3): 5 mg via ORAL
  Filled 2021-09-07 (×3): qty 1

## 2021-09-07 MED ORDER — ASPIRIN-ACETAMINOPHEN-CAFFEINE 250-250-65 MG PO TABS
2.0000 | ORAL_TABLET | Freq: Three times a day (TID) | ORAL | Status: AC
  Administered 2021-09-07 (×3): 2 via ORAL
  Filled 2021-09-07 (×3): qty 2

## 2021-09-07 MED ORDER — FUROSEMIDE 10 MG/ML IJ SOLN
80.0000 mg | Freq: Once | INTRAMUSCULAR | Status: AC
  Administered 2021-09-07: 80 mg via INTRAVENOUS
  Filled 2021-09-07: qty 8

## 2021-09-07 NOTE — Progress Notes (Signed)
NAME:  Gerald [name], MRN:  [id number], DOB:  1980/11/13, LOS: 2 ADMISSION DATE:  09/05/2021  Subjective  Patient evaluated at bedside this AM. Reports having a headache overnight, mentions he doesn't get these outside of the hospital. Otherwise denies dyspnea, chest pain, lower extremity swelling.  Objective   Blood pressure (!) 157/127, pulse 91, temperature 98.5 F (36.9 C), temperature source Oral, resp. rate 20, height 6' (1.829 m), weight 134.9 kg, SpO2 95 %.     Intake/Output Summary (Last 24 hours) at 09/07/2021 0831 Last data filed at 09/07/2021 0427 Gross per 24 hour  Intake 720 ml  Output 2200 ml  Net -1480 ml   Filed Weights   09/05/21 0541 09/06/21 0357 09/07/21 0420  Weight: 133.8 kg 135.9 kg 134.9 kg   Physical Exam: General: Resting comfortably in no acute distress CV: Regular rate, rhythm. No murmurs appreciated Pulm: Normal work of breathing on room air. Clear to ausculation bilaterally. Abdomen: Soft, non-tender, non-distended MSK: Normal bulk, tone. Trace pitting edema bilateral lower extremities. Neuro: Awake, alert, conversing appropriately.  Labs    CBC Latest Ref Rng & Units 09/07/2021 09/06/2021 09/05/2021  WBC 4.0 - 10.5 K/uL 6.7 6.8 8.8  Hemoglobin 13.0 - 17.0 g/dL 10.5(L) 10.4(L) 11.1(L)  Hematocrit 39.0 - 52.0 % 34.2(L) 34.3(L) 35.6(L)  Platelets 150 - 400 K/uL 291 324 309   BMP Latest Ref Rng & Units 09/07/2021 09/06/2021 09/05/2021  Glucose 70 - 99 mg/dL 102(H) 108(H) 109(H)  BUN 6 - 20 mg/dL 26(H) 27(H) 25(H)  Creatinine 0.61 - 1.24 mg/dL 1.93(H) 1.99(H) 1.76(H)  Sodium 135 - 145 mmol/L 138 135 139  Potassium 3.5 - 5.1 mmol/L 3.5 3.7 3.4(L)  Chloride 98 - 111 mmol/L 104 103 107  CO2 22 - 32 mmol/L 23 25 23   Calcium 8.9 - 10.3 mg/dL 8.6(L) 8.4(L) 8.7(L)    Summary  Gerald [name] is 41yo person (he/him) with chronic combined systolic and diastolic heart failure 2/2 non-ischemic cardiomyopathy, chronic renal insufficiency stage IIIb, paroxysmal  atrial fibrillation on Xarelto, and polysubstance use disorder admitted 1/19 for severe uncontrolled hypertension and acute on chronic heart failure, continuing to diurese well.  Assessment & Plan:  Principal Problem:   Severe uncontrolled hypertension Active Problems:   NSTEMI (non-ST elevated myocardial infarction) ([name])   Acute on chronic combined systolic and diastolic CHF (congestive heart failure) ([name])   Atrial flutter ([name])   Elevated troponin   Hypokalemia   Polysubstance abuse ([name])   Obesity, Class III, BMI 40-49.9 (morbid obesity) ([place])   Hemoptysis  #Acute on chronic combined systolic and diastolic heart failure #Non-ischemic cardiomyopathy This morning patient symptomatically improved. Continues to have appropriate urine output, 2.2L, w/ net negative 1.5L. Renal function and electrolytes stable this AM. Weight down to 134.9kg, dry weight roughly 129kg. Patient mildly hypervolemic today, we will continue with diuresis and goal-directed medical therapy. - Continue diuresis w/ IV lasix 80mg  - Carvedilol 12.5mg  twice daily - Isosorbide mononitrate 120mg  daily - Hydralazine 100mg  three times daily - Losartan 50mg  daily - Spironolactone 50mg  daily - Empagliflozin 10mg  daily - Strict I/O - Daily weights - Daily BMP, Mg  #Hypertension Improved, still mildly hypertensive after addition of ARB. Will add on home amlodipine. - Carvedilol, Imdur, hydralazine, losartan as above - Add home amlodipine 5mg  daily  #Paroxysmal atrial fibrillation Stable, remains rate controlled. - Carvedilol 12.5mg  twice daily - Xarelto 20mg  daily  #Chronic renal insufficiency stage IIIb Remains at baseline, will continue to monitor as we continue to diurese. -  Daily BMP - Strict I/O  #Tension headache #Polysubstance use Continues to report bothersome headache consistent with tension-type. Per night team, patient became argumentative with nursing staff over pain medication. Patient is not on  any long-term opioid medication for pain. Patient does have history of polysubstance use, with UDS on arrival positive for opiates. No current signs of opioid withdrawal. Moving forward, we will not be prescribing opioid medications for tension headache, as this would be inappropriate. I have ordered scheduled Excedrin. - Excedrin two tablets three times daily today, will re-evaluate in AM - Avoid opioid medications - TOC consult for polysubstance use   Best practice:  DIET: HH IVF: n/a DVT PPX: Xarelto BOWEL: Miralax CODE: FULL FAM COM: n/a  Sanjuan Dame, MD Internal Medicine Resident PGY-2 PAGER: [phone] 09/07/2021 8:31 AM  If after hours (below), please contact on-call pager: [phone] 5PM-7AM Monday-Friday 1PM-7AM Saturday-Sunday

## 2021-09-08 LAB — BASIC METABOLIC PANEL
Anion gap: 10 (ref 5–15)
BUN: 30 mg/dL — ABNORMAL HIGH (ref 6–20)
CO2: 24 mmol/L (ref 22–32)
Calcium: 8.9 mg/dL (ref 8.9–10.3)
Chloride: 104 mmol/L (ref 98–111)
Creatinine, Ser: 2.04 mg/dL — ABNORMAL HIGH (ref 0.61–1.24)
GFR, Estimated: 41 mL/min — ABNORMAL LOW (ref >60)
Glucose, Bld: 104 mg/dL — ABNORMAL HIGH (ref 70–99)
Potassium: 3.6 mmol/L (ref 3.5–5.1)
Sodium: 138 mmol/L (ref 135–145)

## 2021-09-08 LAB — GLUCOSE, CAPILLARY
Glucose-Capillary: 109 mg/dL — ABNORMAL HIGH (ref 70–99)
Glucose-Capillary: 103 mg/dL — ABNORMAL HIGH (ref 70–99)

## 2021-09-08 LAB — MAGNESIUM: Magnesium: 2.2 mg/dL (ref 1.7–2.4)

## 2021-09-08 MED ORDER — FUROSEMIDE 10 MG/ML IJ SOLN
80.0000 mg | Freq: Once | INTRAMUSCULAR | Status: AC
  Administered 2021-09-08: 80 mg via INTRAVENOUS
  Filled 2021-09-08: qty 8

## 2021-09-08 MED ORDER — POTASSIUM CHLORIDE CRYS ER 20 MEQ PO TBCR
40.0000 meq | EXTENDED_RELEASE_TABLET | Freq: Once | ORAL | Status: AC
  Administered 2021-09-08: 40 meq via ORAL
  Filled 2021-09-08: qty 2

## 2021-09-08 NOTE — Progress Notes (Signed)
HD#3 SUBJECTIVE:  Patient Summary: Gerald [name] is a 41 y.o. with a pertinent PMH of HFrEF, CKD 3b, Afib, HTN, polysubstance use disorder, and obesity, who presented with SOB and hemoptysis and admitted for severe symptomatic hypertension and acute on chronic CHF.   Overnight Events: No acute events overnight.  Interim History: This is hospital day 3 for Gerald [name] who was seen and evaluated at the bedside. He was seen sleeping flat in his bed and denies any orthopnea, SOB, or DOE. He feels much better overall and has no acute concerns today.   OBJECTIVE:  Vital Signs: Vitals:   09/07/21 1443 09/07/21 1943 09/08/21 0501 09/08/21 0508  BP: (!) 148/100 123/76  (!) 133/96  Pulse: 74 71  79  Resp: 18 18    Temp: 98 F (36.7 C) 98 F (36.7 C)  98 F (36.7 C)  TempSrc: Oral Oral  Oral  SpO2: 97% 97%  95%  Weight:   134.8 kg   Height:       Supplemental O2: Room Air SpO2: 95 % O2 Flow Rate (L/min): 2 L/min  Filed Weights   09/06/21 0357 09/07/21 0420 09/08/21 0501  Weight: 135.9 kg 134.9 kg 134.8 kg     Intake/Output Summary (Last 24 hours) at 09/08/2021 3790 Last data filed at 09/07/2021 1820 Gross per 24 hour  Intake 717 ml  Output 3625 ml  Net -2908 ml   Net IO Since Admission: -7,858 mL [09/08/21 0614]  Physical Exam: General: Resting comfortably in bed. No acute distress. CV: RRR. No murmurs. Trace LE edema, improved.  Pulmonary: Lungs CTAB. Normal effort on room air.  Abdominal: Soft, nontender, nondistended.  Extremities: Palpable radial and DP pulses.  Neuro: A&Ox3. No focal deficit. Psych: Normal mood and affect     ASSESSMENT/PLAN:  Assessment: Principal Problem:   Severe uncontrolled hypertension Active Problems:   NSTEMI (non-ST elevated myocardial infarction) ([name])   Acute on chronic combined systolic and diastolic CHF (congestive heart failure) ([name])   Atrial flutter ([name])   Elevated troponin   Hypokalemia   Polysubstance abuse ([name])    Obesity, Class III, BMI 40-49.9 (morbid obesity) ([place])   Hemoptysis   Plan: #Acute on chronic combined systolic and diastolic heart failure #Non-ischemic cardiomyopathy Patient symptomatically feeling improved with no orthopnea, SOB, or DOE reported. He diuresed well yesterday with 3.6 L UOP, with net negative 8 L this admission. Weights unchanged, although suspect this is inaccurate. Continuing on amlodipine 5 mg, coreg 12.5 mg bid, hydralazine 100 mg q8h, imdur 120 mg, losartan 50 mg, jardiance 10 mg, and spironolactone 50 mg. Will also continue with IV diuresis today and likely can discharge to home tomorrow. - IV lasix 80 mg - Continue coreg, hydralazine, imdur, jardiance, losartan, & spironolactone - Strict I&Os and daily weights - Daily BMP and Mg  #Hypertension BP improved after addition of losartan 50 mg. Will continue home medications. - Coreg, hydralazine, imdur, losartan, and amlodipine as above  #Paroxysmal Afib Patient with previous DCCV. He remains rate controlled and in sinus rhythm. K 3.6 today and Mg 2.2. Will give oral potassium 40 mEq today.  - Carvedilol 12.5 mg bid - Xarelto 20 mg daily - Maintain K>4, Mg>2  #CKD IIIb Cr 2.04 today, GFR 41. Renal function unchanged despite IV diuresis as noted above. - Daily BMP - Avoid nephrotoxins   Best Practice: Diet: Cardiac diet IVF: Fluids: none VTE: Xarelto Code: Full AB: none Therapy Recs: None DISPO: Anticipated discharge tomorrow to Home  pending Medical stability.  Signature: Buddy Duty, D.O.  Internal Medicine Resident, PGY-1 [facility]  Pager: [phone] 6:14 AM, 09/08/2021   Please contact the on call pager after 5 pm and on weekends at [phone].

## 2021-09-09 ENCOUNTER — Encounter (HOSPITAL_COMMUNITY): Payer: Self-pay | Admitting: Internal Medicine

## 2021-09-09 ENCOUNTER — Other Ambulatory Visit (HOSPITAL_COMMUNITY): Payer: Self-pay

## 2021-09-09 LAB — BASIC METABOLIC PANEL
Anion gap: 8 (ref 5–15)
BUN: 26 mg/dL — ABNORMAL HIGH (ref 6–20)
CO2: 22 mmol/L (ref 22–32)
Calcium: 8.6 mg/dL — ABNORMAL LOW (ref 8.9–10.3)
Chloride: 103 mmol/L (ref 98–111)
Creatinine, Ser: 2.17 mg/dL — ABNORMAL HIGH (ref 0.61–1.24)
GFR, Estimated: 39 mL/min — ABNORMAL LOW (ref >60)
Glucose, Bld: 102 mg/dL — ABNORMAL HIGH (ref 70–99)
Potassium: 3.9 mmol/L (ref 3.5–5.1)
Sodium: 133 mmol/L — ABNORMAL LOW (ref 135–145)

## 2021-09-09 LAB — GLUCOSE, CAPILLARY
Glucose-Capillary: 103 mg/dL — ABNORMAL HIGH (ref 70–99)
Glucose-Capillary: 112 mg/dL — ABNORMAL HIGH (ref 70–99)

## 2021-09-09 LAB — MAGNESIUM: Magnesium: 2.1 mg/dL (ref 1.7–2.4)

## 2021-09-09 MED ORDER — LOSARTAN POTASSIUM 50 MG PO TABS
50.0000 mg | ORAL_TABLET | Freq: Every day | ORAL | 0 refills | Status: DC
  Filled 2021-09-09: qty 30, 30d supply, fill #0

## 2021-09-09 MED ORDER — RIVAROXABAN 20 MG PO TABS
20.0000 mg | ORAL_TABLET | Freq: Every day | ORAL | 0 refills | Status: DC
  Filled 2021-09-09: qty 30, 30d supply, fill #0

## 2021-09-09 MED ORDER — AMLODIPINE BESYLATE 5 MG PO TABS
5.0000 mg | ORAL_TABLET | Freq: Every day | ORAL | 0 refills | Status: DC
  Filled 2021-09-09: qty 30, 30d supply, fill #0

## 2021-09-09 MED ORDER — ATORVASTATIN CALCIUM 40 MG PO TABS
40.0000 mg | ORAL_TABLET | Freq: Every day | ORAL | 0 refills | Status: DC
  Filled 2021-09-09: qty 30, 30d supply, fill #0

## 2021-09-09 MED ORDER — EMPAGLIFLOZIN 10 MG PO TABS
10.0000 mg | ORAL_TABLET | Freq: Every day | ORAL | 0 refills | Status: DC
  Filled 2021-09-09: qty 30, 30d supply, fill #0

## 2021-09-09 MED ORDER — TORSEMIDE 20 MG PO TABS
20.0000 mg | ORAL_TABLET | Freq: Every day | ORAL | 0 refills | Status: DC
  Filled 2021-09-09: qty 30, 30d supply, fill #0

## 2021-09-09 MED ORDER — SPIRONOLACTONE 50 MG PO TABS
50.0000 mg | ORAL_TABLET | Freq: Every day | ORAL | 0 refills | Status: DC
  Filled 2021-09-09: qty 30, 30d supply, fill #0

## 2021-09-09 MED ORDER — ISOSORBIDE MONONITRATE ER 120 MG PO TB24
120.0000 mg | ORAL_TABLET | Freq: Every day | ORAL | 0 refills | Status: DC
  Filled 2021-09-09: qty 30, 30d supply, fill #0

## 2021-09-09 MED ORDER — HYDRALAZINE HCL 100 MG PO TABS
100.0000 mg | ORAL_TABLET | Freq: Three times a day (TID) | ORAL | 0 refills | Status: DC
  Filled 2021-09-09: qty 90, 30d supply, fill #0

## 2021-09-09 MED ORDER — CARVEDILOL 12.5 MG PO TABS
12.5000 mg | ORAL_TABLET | Freq: Two times a day (BID) | ORAL | 0 refills | Status: DC
  Filled 2021-09-09: qty 60, 30d supply, fill #0

## 2021-09-09 MED ORDER — POTASSIUM CHLORIDE CRYS ER 10 MEQ PO TBCR
10.0000 meq | EXTENDED_RELEASE_TABLET | Freq: Every day | ORAL | 0 refills | Status: DC
  Filled 2021-09-09: qty 30, 30d supply, fill #0

## 2021-09-09 NOTE — TOC Transition Note (Addendum)
Transition of Care [facility]) - CM/SW Discharge Note   Patient Details  Name: Gerald [name] MRN: [id number] Date of Birth: 1981-05-14  Transition of Care [facility]) CM/SW Contact:  Zenon Mayo, RN Phone Number: 09/09/2021, 12:09 PM   Clinical Narrative:    Patient is for dc today, he has no insurance, he has follow up apt with [facility] and HF clinic.  TOC to fill medications today.  Patient has used [facility] twice already in last year.  Patient has also used all coupons for medications that he can use. Per Dr. Collene Gobble with [facility], they will assist him in getting his meds for 4.00.  NCM spoke with patient at bedside offered SA resources, he states he does not want them.  NCM gave him the brochure for [facility].   Final next level of care: Home/Self Care Barriers to Discharge: Inadequate or no insurance   Patient Goals and CMS Choice     Choice offered to / list presented to : NA  Discharge Placement                       Discharge Plan and Services                  DME Agency: NA       HH Arranged: NA          Social Determinants of Health (SDOH) Interventions     Readmission Risk Interventions Readmission Risk Prevention Plan 06/03/2021 04/02/2021  Transportation Screening Complete Complete  Medication Review Press photographer) Complete Complete  PCP or Specialist appointment within 3-5 days of discharge Complete Complete  HRI or [place] Complete Complete  SW Recovery Care/Counseling Consult Complete Complete  Palliative Care Screening Not Applicable Not [place] Not Applicable Not Applicable  Some recent data might be hidden

## 2021-09-09 NOTE — Discharge Summary (Signed)
Name: Gerald [name] MRN: [id number] DOB: 1980-09-25 41 y.o. PCP: Patient, [name] (Inactive)  Date of Admission: 09/05/2021  5:21 AM Date of Discharge:  09/09/2021 Attending Physician: Dr. Angelia Mould  DISCHARGE DIAGNOSIS:  Primary Problem: Severe uncontrolled hypertension   Hospital Problems: Principal Problem:   Severe uncontrolled hypertension Active Problems:   NSTEMI (non-ST elevated myocardial infarction) ([place])   Acute on chronic combined systolic and diastolic CHF (congestive heart failure) ([name])   Atrial flutter ([name])   Elevated troponin   Hypokalemia   Polysubstance abuse ([name])   Obesity, Class III, BMI 40-49.9 (morbid obesity) ([place])   Hemoptysis    DISCHARGE MEDICATIONS:   Allergies as of 09/09/2021   No Known Allergies      Medication List     STOP taking these medications    DIUREX PO       TAKE these medications    amLODipine 5 MG tablet Commonly known as: NORVASC Take 1 tablet (5 mg total) by mouth daily.   atorvastatin 40 MG tablet Commonly known as: LIPITOR Take 1 tablet (40 mg total) by mouth daily.   carvedilol 12.5 MG tablet Commonly known as: COREG Take 1 tablet (12.5 mg total) by mouth 2 (two) times daily with a meal. What changed:  medication strength how much to take   empagliflozin 10 MG Tabs tablet Commonly known as: JARDIANCE Take 1 tablet (10 mg total) by mouth daily.   hydrALAZINE 100 MG tablet Commonly known as: APRESOLINE Take 1 tablet (100 mg total) by mouth every 8 (eight) hours.   isosorbide mononitrate 120 MG 24 hr tablet Commonly known as: IMDUR Take 1 tablet (120 mg total) by mouth daily. What changed: medication strength   losartan 50 MG tablet Commonly known as: COZAAR Take 1 tablet (50 mg total) by mouth daily.   potassium chloride 10 MEQ tablet Commonly known as: KLOR-CON M Take 1 tablet (10 mEq total) by mouth daily.   rivaroxaban 20 MG Tabs tablet Commonly known as: XARELTO Take 1 tablet  (20 mg total) by mouth daily with supper.   spironolactone 50 MG tablet Commonly known as: ALDACTONE Take 1 tablet (50 mg total) by mouth daily.   torsemide 20 MG tablet Commonly known as: DEMADEX Take 1 tablet (20 mg total) by mouth daily.        DISPOSITION AND FOLLOW-UP:  [name] was discharged from [facility] in Stable condition. At the hospital follow up visit please address:  Heart failure/HTN: Patient discharged with losartan 50 mg (new), amlodipine 5 mg, coreg 12.5 mg bid, hydralazine 100 mg q8h, imdur 120 mg, jardiance 10 mg, spironolactone 50 mg, and torsemide 20 mg. Please reassess symptoms and medication compliance.  Afib: Remained rate controlled. On coreg as above and xarelto 20 mg daily.  CKD IIIb: Renal function largely at baseline. Recheck BMP  Follow-up Recommendations: Consults: none Labs: Basic Metabolic Profile Studies: none Medications: NEW: losartan 50 mg   Follow-up Appointments:  Follow-up Information     [facility]. Go to.   Specialty: Cardiology Why: Tuesday, January 24 @ 3pm for [facility] [place] [facility] clinic within [place]. Bring all meds with you. Garage code: 1202 at [employer], off [address] to 1st floor. Contact information: [address] [id number] [place] [phone]        [place]. Go on 09/19/2021.   Specialty: Family Medicine  Why: @2 :30pm Contact information: [place] [address] [phone]                HOSPITAL COURSE:  Patient Summary: #Acute on chronic HFrEF #Nonischemic cardiomyopathy CHF likely secondary to severe uncontrolled HTN vs infiltrative cardiomyopathy. Last EF 30-35%, now echo this admission showing EF of 20-25%, severe reduction in LV function and severe LVH, suggestive of infiltrative cardiomyopathy, although  suspect this is all related to uncontrolled hypertension. Also showed LV global hypokinesis and grade III diastolic dysfunction. At home, patient is supposed to be on jardiance 10 mg, imdur 120 mg, hydralazine 100 mg tid, torsemide 20 mg, spiro 50 mg, amlopipine 5 mg, and coreg 12.5 mg bid. The patient does not regularly follow with a PCP and usually gets a month supply of these medications after his hospitalizations- these medications were last prescribed in November. Patient's dry weight ~285 lbs and he was up about 10 lbs on admission. He was diuresed with IV lasix 80 mg and had good urine output. Net negative 10.5 L on day of discharge, with no signs of volume overload on exam. Patient was started on losartan 50 mg this admission and resumed on rest of "home" GDMT- discharged with amlodipine 5 mg, coreg 12.5 mg bid, hydralazine 100 mg q8h, imdur 120 mg, torsemide 20 mg, losartan 50 mg, jardiance 10 mg, and spironolactone 50 mg. Patient has follow up appt on 2/2 to establish care with PCP.    #Severe HTN Patient with demand ischemia, with trops peaked at 900. He underwent an ischemic evaluation about 6 months ago, with clear coronaries. Continue home imdur, hydralazine, and amlodipine as noted above. Started on losartan also.   #Paroxysmal Afib on xarelto Patient with previous DCCV and on xarelto therapy. He remained in NSR. Resumed home xarelto.   #CKD 3b  Renal function remained at baseline.   #Tension headache #Polysubstance use Patient endorsing a tension-type headache during hospitalization. He is not on any long-term opioid medications at home for pain. He does have a history of polysubstance use, with UDS positive for opiates on arrival. Headache improved with Excedrin. Headache could be secondary to imdur, however, discussed risks and benefits with the patient, and he would like to continue taking imdur.    DISCHARGE INSTRUCTIONS:   Discharge Instructions     (HEART FAILURE PATIENTS)  Call MD:  Anytime you have any of the following symptoms: 1) 3 pound weight gain in 24 hours or 5 pounds in 1 week 2) shortness of breath, with or without a dry hacking cough 3) swelling in the hands, feet or stomach 4) if you have to sleep on extra pillows at night in order to breathe.   Complete by: As directed    Diet - low sodium heart healthy   Complete by: As directed    Discharge instructions   Complete by: As directed    Dear Mr. Wholey,  You were hospitalized for a heart failure exacerbation and severe high blood pressure. You were restarted on your home medications and were also started on a new medication, called losartan 50 mg. Please take this medication daily.   You also were prescribed your home medications that you had previously been on, including: jardiance 10 mg, imdur 120 mg, hydralazine 100 mg three times daily, torsemide 20 mg, spironolactone 50 mg, amlodipine 5 mg, and coreg 12.5 mg twice daily. Please take these as prescribed.   You have an appt on 2/2 at 2:30 pm at  [facility] in [place]. Please try to go to this appt so you can follow up regarding your recent hospitalization and your heart failure/high blood pressure.   You also have an appt on 1/24 at 3pm with the cardiology clinic- this is very important for you to establish with them, given the severity of your heart failure.   Increase activity slowly   Complete by: As directed         SUBJECTIVE:  Gerald [name] was seen and evaluated on the day of discharge. He states that he feels like his breathing and swelling is doing a lot better today. He does have a headache, which we discussed could be from the imdur, however, he would rather stay on this medicine. Otherwise feels well to be discharged to home.   Discharge Vitals:   BP (!) 148/97 (BP Location: Right Arm)    Pulse (!) 108    Temp 100.1 F (37.8 C) (Oral)    Resp 18    Ht 6' (1.829 m)    Wt 134 kg    SpO2  96%    BMI 40.08 kg/m   OBJECTIVE:  General: Resting comfortably in bed. No acute distress. CV: RRR. No murmurs. Trace LE edema Pulmonary: Lungs CTAB. Normal effort. Abdominal: Soft, nontender, nondistended.  Extremities: Normal bulk and tone Neuro: A&Ox3.No focal deficit. Psych: Normal mood and affect    Pertinent Labs, Studies, and Procedures:  CBC Latest Ref Rng & Units 09/07/2021 09/06/2021 09/05/2021  WBC 4.0 - 10.5 K/uL 6.7 6.8 8.8  Hemoglobin 13.0 - 17.0 g/dL 10.5(L) 10.4(L) 11.1(L)  Hematocrit 39.0 - 52.0 % 34.2(L) 34.3(L) 35.6(L)  Platelets 150 - 400 K/uL 291 324 309    CMP Latest Ref Rng & Units 09/09/2021 09/08/2021 09/07/2021  Glucose 70 - 99 mg/dL 102(H) 104(H) 102(H)  BUN 6 - 20 mg/dL 26(H) 30(H) 26(H)  Creatinine 0.61 - 1.24 mg/dL 2.17(H) 2.04(H) 1.93(H)  Sodium 135 - 145 mmol/L 133(L) 138 138  Potassium 3.5 - 5.1 mmol/L 3.9 3.6 3.5  Chloride 98 - 111 mmol/L 103 104 104  CO2 22 - 32 mmol/L 22 24 23   Calcium 8.9 - 10.3 mg/dL 8.6(L) 8.9 8.6(L)  Total Protein 6.5 - 8.1 g/dL - - -  Total Bilirubin 0.3 - 1.2 mg/dL - - -  Alkaline Phos 38 - 126 U/L - - -  AST 15 - 41 U/L - - -  ALT 0 - 44 U/L - - -    DG Chest 2 View  Result Date: 09/05/2021 CLINICAL DATA:  Chest pain EXAM: CHEST - 2 VIEW COMPARISON:  07/07/2021 FINDINGS: Cardiac enlargement is unchanged from previous exam. Multifocal airspace opacities involving the right upper lobe, right middle lobe and right lower lobe concerning for multifocal infection. Mild patchy airspace densities within the perihilar left mid lung also noted. IMPRESSION: Multifocal airspace opacities involving the right upper lobe, right middle lobe and right lower lobe concerning for multifocal infection. Cardiac enlargement. Electronically Signed   By: Kerby Moors M.D.   On: 09/05/2021 06:15   ECHOCARDIOGRAM COMPLETE  Result Date: 09/05/2021    ECHOCARDIOGRAM REPORT   Patient Name:   Gerald [name] Date of Exam: 09/05/2021 Medical Rec #:   [id number]        Height:       72.0 in Accession #:    [id number]       Weight:       295.0 lb Date  of Birth:  July 27, 1981        BSA:          2.513 m Patient Age:    40 years         BP:           152/88 mmHg Patient Gender: M                HR:           90 bpm. Exam Location:  Inpatient Procedure: 2D Echo Indications:    Congestive heart failure  History:        Patient has prior history of Echocardiogram examinations, most                 recent 01/20/2021. Risk Factors:Hypertension.  Sonographer:    Jefferey Pica Referring Phys: [id number] [place]  1. Severe global reduction in LV function; severe LVH; suggest cardiac MRI to R/O infiltrative cardiomyopathy or HCM.  2. Left ventricular ejection fraction, by estimation, is 20 to 25%. The left ventricle has severely decreased function. The left ventricle demonstrates global hypokinesis. The left ventricular internal cavity size was moderately dilated. There is severe  left ventricular hypertrophy. Left ventricular diastolic parameters are consistent with Grade III diastolic dysfunction (restrictive).  3. Right ventricular systolic function is normal. The right ventricular size is normal. There is mildly elevated pulmonary artery systolic pressure.  4. Left atrial size was severely dilated.  5. A small pericardial effusion is present.  6. The mitral valve is normal in structure. Trivial mitral valve regurgitation. No evidence of mitral stenosis.  7. The aortic valve is tricuspid. Aortic valve regurgitation is not visualized. No aortic stenosis is present.  8. Aortic dilatation noted. There is mild dilatation of the aortic root, measuring 40 mm. There is mild dilatation of the ascending aorta, measuring 40 mm.  9. The inferior vena cava is normal in size with greater than 50% respiratory variability, suggesting right atrial pressure of 3 mmHg. FINDINGS  Left Ventricle: Left ventricular ejection fraction, by estimation, is 20 to 25%. The left  ventricle has severely decreased function. The left ventricle demonstrates global hypokinesis. The left ventricular internal cavity size was moderately dilated. There is severe left ventricular hypertrophy. Left ventricular diastolic parameters are consistent with Grade III diastolic dysfunction (restrictive). Right Ventricle: The right ventricular size is normal. Right ventricular systolic function is normal. There is mildly elevated pulmonary artery systolic pressure. The tricuspid regurgitant velocity is 3.00 m/s, and with an assumed right atrial pressure of 5 mmHg, the estimated right ventricular systolic pressure is 58.8 mmHg. Left Atrium: Left atrial size was severely dilated. Right Atrium: Right atrial size was normal in size. Pericardium: A small pericardial effusion is present. Mitral Valve: The mitral valve is normal in structure. Trivial mitral valve regurgitation. No evidence of mitral valve stenosis. Tricuspid Valve: The tricuspid valve is normal in structure. Tricuspid valve regurgitation is trivial. No evidence of tricuspid stenosis. Aortic Valve: The aortic valve is tricuspid. Aortic valve regurgitation is not visualized. No aortic stenosis is present. Aortic valve peak gradient measures 7.1 mmHg. Pulmonic Valve: The pulmonic valve was normal in structure. Pulmonic valve regurgitation is mild. No evidence of pulmonic stenosis. Aorta: Aortic dilatation noted. There is mild dilatation of the aortic root, measuring 40 mm. There is mild dilatation of the ascending aorta, measuring 40 mm. Venous: The inferior vena cava is normal in size with greater than 50% respiratory variability, suggesting right atrial pressure of 3 mmHg. IAS/Shunts:  No atrial level shunt detected by color flow Doppler. Additional Comments: Severe global reduction in LV function; severe LVH; suggest cardiac MRI to R/O infiltrative cardiomyopathy or HCM.  LEFT VENTRICLE PLAX 2D LVIDd:         6.00 cm LVIDs:         5.20 cm LV PW:          3.00 cm LV IVS:        2.00 cm LVOT diam:     2.60 cm LVOT Area:     5.31 cm  LV Volumes (MOD) LV vol d, MOD A4C: 187.0 ml LV vol s, MOD A4C: 139.0 ml LV SV MOD A4C:     187.0 ml RIGHT VENTRICLE             IVC RV Basal diam:  2.90 cm     IVC diam: 2.00 cm RV S prime:     12.90 cm/s TAPSE (M-mode): 2.7 cm LEFT ATRIUM              Index        RIGHT ATRIUM           Index LA diam:        5.10 cm  2.03 cm/m   RA Area:     17.60 cm LA Vol (A2C):   236.0 ml 93.90 ml/m  RA Volume:   43.90 ml  17.47 ml/m LA Vol (A4C):   104.0 ml 41.38 ml/m LA Biplane Vol: 163.0 ml 64.86 ml/m  AORTIC VALVE                 PULMONIC VALVE AV Area (Vmax): 3.93 cm     PV Vmax:       0.79 m/s AV Vmax:        132.80 cm/s  PV Peak grad:  2.5 mmHg AV Peak Grad:   7.1 mmHg LVOT Vmax:      98.37 cm/s  AORTA Ao Root diam: 4.00 cm Ao Asc diam:  4.00 cm TRICUSPID VALVE TR Peak grad:   36.0 mmHg TR Vmax:        300.00 cm/s  SHUNTS Systemic Diam: 2.60 cm Kirk Ruths MD Electronically signed by Kirk Ruths MD Signature Date/Time: 09/05/2021/1:39:44 PM    Final      Signed: Buddy Duty, D.O.  Internal Medicine Resident, PGY-1 [facility]  Pager: [phone] 11:03 AM, 09/09/2021

## 2021-09-09 NOTE — Discharge Instructions (Addendum)
Dear [name],  You were hospitalized for a heart failure exacerbation and severe high blood pressure. You were restarted on your home medications and were also started on a new medication, called losartan 50 mg. Please take this medication daily.   You also were prescribed your home medications that you had previously been on, including: jardiance 10 mg, imdur 120 mg, hydralazine 100 mg three times daily, torsemide 20 mg, spironolactone 50 mg, amlodipine 5 mg, and coreg 12.5 mg twice daily. Please take these as prescribed.   You have an appt on 2/2 at 2:30 pm at [facility] in [place]. Please try to go to this appt so you can follow up regarding your recent hospitalization and your heart failure/high blood pressure.   You also have an appt on 1/24 at 3pm with the cardiology clinic- this is very important for you to establish with them, given the severity of your heart failure.

## 2021-09-09 NOTE — Progress Notes (Signed)
Heart Failure Stewardship Pharmacist Progress Note   PCP: Patient, [name] (Inactive) PCP-Cardiologist: Ida Rogue, MD    HPI:  41 yo M with PMH of CHF, HTN, afib, CKD IIIa, tobacco use, cocaine use, afib, and non-compliance. Multiple admissions for HF because pt runs out of meds and will no-show for follow up. ECHO done 1/19 and LVEF is 20-25% (down from 30-35% in June 2022).   Current HF Medications: Beta Blocker: carvedilol 12.5 mg BID ACE/ARB/ARNI: losartan 50 mg daily Aldosterone Antagonist: spironolactone 50 mg daily SGLT2i: Jardiance 10 mg daily Other: hydralazine 100 mg q8h, imdur 120 mg daily  Prior to admission HF Medications: None  Pertinent Lab Values: Serum creatinine 2.17, BUN 26, Potassium 3.9, Sodium 133, BNP 661, Magnesium 2.1, A1c 6.1   Vital Signs: Weight: 295 lbs (admission weight: 299 lbs) Blood pressure: 130-150/90s  Heart rate: 100s  I/O: -3.2L yesterday; net -11L  Medication Assistance / Insurance Benefits Check: Does the patient have prescription insurance?  No  Does the patient qualify for medication assistance through manufacturers or grants?   Yes Eligible grants and/or patient assistance programs: Ferne Coe, Xarelto Medication assistance applications in progress: none  Medication assistance applications approved: Claudie Leach (expired end of 2022) Approved medication assistance renewals will be completed by: [place]:  Prior to admission [facility] Is the patient willing to use [facility] at discharge? Yes Is the patient willing to transition their outpatient pharmacy to utilize a [facility] outpatient pharmacy?   Pending    Assessment: 1. Acute on chronic systolic CHF (EF 17-49%), due to noncompliance. NYHA class II symptoms. - Off IV lasix today - Continue carvedilol 12.5 mg BID - Continue losartan 50 mg daily - Continue spironolactone 50 mg daily - Continue Jardiance 10 mg  daily - Continue hydralazine 100 mg q8h and Imdur 120 mg daily   Plan: 1) Medication changes recommended at this time: - Continue current therapy given climb in SCr - would like to titrate off amlodipine and increase HF GDMT if able as outpatient if discharging today  2) Patient assistance: - See above  Kerby Nora, PharmD, BCPS Heart Failure Stewardship Pharmacist Phone [phone]

## 2021-09-09 NOTE — Progress Notes (Signed)
Heart Failure Nurse Navigator Progress Note  Adjusted HV TOC clinic appt to 1/30 @ 2pm as pt is still hospitalized at this time.   Encouraged pt to attend as hospitalization are cyclical when he runs out of medications given from prior hospitalization.   Congratulated and encouraged continued cessation of cocaine. Last use was over 2 weeks ago, per pt statement.   Gave pt new appt reminder card.  Pricilla Holm, MSN, RN Heart Failure Nurse Navigator [phone]

## 2021-09-09 NOTE — TOC Initial Note (Signed)
Transition of Care [facility]) - Initial/Assessment Note    Patient Details  Name: Gerald [name] MRN: [id number] Date of Birth: 1980-08-29  Transition of Care [facility]) CM/SW Contact:    Zenon Mayo, RN Phone Number: 09/09/2021, 1:38 PM  Clinical Narrative:                 Patient is for dc today, he has no insurance, he has follow up apt with [facility] and HF clinic.  TOC to fill medications today.  Patient has used [facility] twice already in last year.  Patient has also used all coupons for medications that he can use. Per Dr. Collene Gobble with [facility], they will assist him in getting his meds for 4.00.  NCM spoke with patient at bedside offered SA resources, he states he does not want them.  NCM gave him the brochure for [facility]  NCM made referral to financial counselor.     Expected Discharge Plan: Home/Self Care Barriers to Discharge: Inadequate or no insurance   Patient Goals and CMS Choice Patient states their goals for this hospitalization and ongoing recovery are:: return home   Choice offered to / list presented to : NA  Expected Discharge Plan and Services Expected Discharge Plan: Home/Self Care In-house Referral: Financial Counselor Discharge Planning Services: CM Consult Post Acute Care Choice: NA   Expected Discharge Date: 09/09/21                 DME Agency: NA       HH Arranged: NA          Prior Living Arrangements/Services     Patient language and need for interpreter reviewed:: Yes Do you feel safe going back to the place where you live?: Yes      Need for Family Participation in Patient Care: No (Comment) Care giver support system in place?: No (comment)   Criminal Activity/Legal Involvement Pertinent to Current Situation/Hospitalization: No - Comment as needed  Activities of Daily Living Home Assistive Devices/Equipment: None ADL Screening (condition at time of admission) Patient's cognitive ability adequate to safely complete  daily activities?: Yes Is the patient deaf or have difficulty hearing?: No Does the patient have difficulty seeing, even when wearing glasses/contacts?: No Does the patient have difficulty concentrating, remembering, or making decisions?: No Patient able to express need for assistance with ADLs?: Yes Does the patient have difficulty dressing or bathing?: No Independently performs ADLs?: Yes (appropriate for developmental age) Does the patient have difficulty walking or climbing stairs?: No Weakness of Legs: None Weakness of Arms/Hands: None  Permission Sought/Granted                  Emotional Assessment Appearance:: Appears stated age Attitude/Demeanor/Rapport: Engaged Affect (typically observed): Appropriate Orientation: : Oriented to Situation, Oriented to  Time, Oriented to Place, Oriented to Self Alcohol / Substance Use: Illicit Drugs Psych Involvement: No (comment)  Admission diagnosis:  Severe uncontrolled hypertension [I10] Hypertensive urgency [I16.0] Acute on chronic congestive heart failure, unspecified heart failure type [facility]) [I50.9] Patient Active Problem List   Diagnosis Date Noted   Severe uncontrolled hypertension 09/05/2021   Restless leg 07/08/2021   Hemoptysis 53/29/9242   Acute diastolic CHF (congestive heart failure) ([place]) 03/30/2021   CHF (congestive heart failure) ([place]) 03/30/2021   Hypertensive crisis 01/20/2021   Polysubstance abuse ([place]) 01/20/2021   Obesity, Class III, BMI 40-49.9 (morbid obesity) ([place]) 01/20/2021   Hypertensive emergency 12/21/2020   CKD (chronic kidney disease), stage III ([place]) 12/21/2020  Acute on chronic systolic CHF (congestive heart failure) ([place]) 07/05/2020   PAF (paroxysmal atrial fibrillation) ([place]) 07/05/2020   Elevated troponin 07/05/2020   Microcytic anemia 07/05/2020   Hypokalemia 07/05/2020   Malignant hypertension    Acute on chronic combined systolic and diastolic CHF (congestive heart failure) ([name])     Atrial flutter ([place])    Essential hypertension    Acute systolic congestive heart failure ([place])    Acute respiratory failure ([place]) 04/01/2019   NSTEMI (non-ST elevated myocardial infarction) ([place]) 12/21/2015   Community acquired pneumonia 12/21/2015   PCP:  Patient, [name] (Inactive) Pharmacy:   [facility] [address] [place] [address] Phone: [phone] Fax: [phone]     Social Determinants of Health (SDOH) Interventions    Readmission Risk Interventions Readmission Risk Prevention Plan 09/09/2021 06/03/2021 04/02/2021  Transportation Screening Complete Complete Complete  Medication Review [employer]) Complete Complete Complete  PCP or Specialist appointment within 3-5 days of discharge Complete Complete Complete  HRI or Home Care Consult Complete Complete Complete  SW Recovery Care/Counseling Consult Complete Complete Complete  Palliative Care Screening Not Applicable Not Applicable Not [place] Not Applicable Not Applicable Not Applicable  Some recent data might be hidden

## 2021-09-10 ENCOUNTER — Encounter (HOSPITAL_COMMUNITY): Payer: Self-pay

## 2021-09-16 ENCOUNTER — Telehealth (HOSPITAL_COMMUNITY): Payer: Self-pay

## 2021-09-16 ENCOUNTER — Encounter (HOSPITAL_COMMUNITY): Payer: Self-pay

## 2021-09-16 NOTE — Progress Notes (Incomplete)
HEART & VASCULAR TRANSITION OF CARE CONSULT NOTE     Referring Physician: Dr. Oval Linsey  Primary Care: Patient, [name] (Inactive) Primary Cardiologist:  HPI: Referred to clinic by Dr. Oval Linsey for heart failure consultation.   41 y/o male w/ chronic systolic heart failure, nonischemic cardiomyopathy, HTN, PAF, obesity polysubstance abuse, poor compliance.   Echo 12/2015 EF 60%, GIDD, Severe LVH Echo 03/2019 EF 30-35%, RV normal, Mod MR Echo 06/2020 EF 30-35%, GIIDD, RV normal  Echo  01/2021 EF 30-35%, RV normal  Echo 08/2021  EF 20-25%, GIII (Restrictive), RV normal, global HK  LHC 5/21 Normal coronaries   Recent admit for a/c CHF in the setting of hypertensive urgency and poor med compliance. Echo EF 20-25%, normal RV. Hs trop peaked to 1.009. UDS negative.   Hgb A1c 6.1   Cardiac Testing  Echo 12/2015 EF 60%, GIDD, Severe LVH Echo 03/2019 EF 30-35%, RV normal, Mod MR Echo 06/2020 EF 30-35%, GIIDD, RV normal  Echo  01/2021 EF 30-35%, RV normal  Echo 08/2021  EF 20-25%, GIII (Restrictive), RV normal, global HK  LHC 5/21 Normal coronaries   Review of Systems: [y] = yes, [ ]  = no   General: Weight gain [ ] ; Weight loss [ ] ; Anorexia [ ] ; Fatigue [ ] ; Fever [ ] ; Chills [ ] ; Weakness [ ]   Cardiac: Chest pain/pressure [ ] ; Resting SOB [ ] ; Exertional SOB [ ] ; Orthopnea [ ] ; Pedal Edema [ ] ; Palpitations [ ] ; Syncope [ ] ; Presyncope [ ] ; Paroxysmal nocturnal dyspnea[ ]   Pulmonary: Cough [ ] ; Wheezing[ ] ; Hemoptysis[ ] ; Sputum [ ] ; Snoring [ ]   GI: Vomiting[ ] ; Dysphagia[ ] ; Melena[ ] ; Hematochezia [ ] ; Heartburn[ ] ; Abdominal pain [ ] ; Constipation [ ] ; Diarrhea [ ] ; BRBPR [ ]   GU: Hematuria[ ] ; Dysuria [ ] ; Nocturia[ ]   Vascular: Pain in legs with walking [ ] ; Pain in feet with lying flat [ ] ; Non-healing sores [ ] ; Stroke [ ] ; TIA [ ] ; Slurred speech [ ] ;  Neuro: Headaches[ ] ; Vertigo[ ] ; Seizures[ ] ; Paresthesias[ ] ;Blurred vision [ ] ; Diplopia [ ] ; Vision changes [ ]    Ortho/Skin: Arthritis [ ] ; Joint pain [ ] ; Muscle pain [ ] ; Joint swelling [ ] ; Back Pain [ ] ; Rash [ ]   Psych: Depression[ ] ; Anxiety[ ]   Heme: Bleeding problems [ ] ; Clotting disorders [ ] ; Anemia [ ]   Endocrine: Diabetes [ ] ; Thyroid dysfunction[ ]    Past Medical History:  Diagnosis Date   Atrial flutter, paroxysmal ([name])    Chronic combined systolic and diastolic CHF (congestive heart failure) ([name])    Chronic kidney disease, stage III (moderate) ([name])    Dysrhythmia    brief episode afib, cardioverted did not return   Hypertension    Polysubstance abuse ([name])     Current Outpatient Medications  Medication Sig Dispense Refill   amLODipine (NORVASC) 5 MG tablet Take 1 tablet (5 mg total) by mouth daily. 30 tablet 0   atorvastatin (LIPITOR) 40 MG tablet Take 1 tablet (40 mg total) by mouth daily. 30 tablet 0   carvedilol (COREG) 12.5 MG tablet Take 1 tablet (12.5 mg total) by mouth 2 (two) times daily with a meal. 60 tablet 0   empagliflozin (JARDIANCE) 10 MG TABS tablet Take 1 tablet (10 mg total) by mouth daily. 30 tablet 0   hydrALAZINE (APRESOLINE) 100 MG tablet Take 1 tablet (100 mg total) by mouth every 8 (eight) hours. 90 tablet 0   isosorbide mononitrate (  IMDUR) 120 MG 24 hr tablet Take 1 tablet (120 mg total) by mouth daily. 30 tablet 0   losartan (COZAAR) 50 MG tablet Take 1 tablet (50 mg total) by mouth daily. 30 tablet 0   potassium chloride (KLOR-CON M) 10 MEQ tablet Take 1 tablet (10 mEq total) by mouth daily. 30 tablet 0   rivaroxaban (XARELTO) 20 MG TABS tablet Take 1 tablet (20 mg total) by mouth daily with supper. 30 tablet 0   spironolactone (ALDACTONE) 50 MG tablet Take 1 tablet (50 mg total) by mouth daily. 30 tablet 0   torsemide (DEMADEX) 20 MG tablet Take 1 tablet (20 mg total) by mouth daily. 30 tablet 0   No current facility-administered medications for this visit.    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   Sexual activity: Yes    Partners: Female    Birth control/protection: Condom  Other Topics Concern   Not on file  Social History Narrative   Not on file   Social Determinants of Health   Financial Resource Strain: High Risk   Difficulty of Paying Living Expenses: Hard  Food Insecurity: No Food Insecurity   Worried About Charity fundraiser in the Last Year: Never true   Ran Out of Food in the Last Year: Never true  Transportation Needs: No Transportation Needs   Lack of Transportation (Medical): No   Lack of Transportation (Non-Medical): No  Physical Activity: Not on file  Stress: Not on file  Social Connections: Not on file  Intimate Partner Violence: Not on file      Family History  Problem Relation Age of Onset   Hypertension Father    Heart disease Paternal Grandfather     There were no vitals filed for this visit.  PHYSICAL EXAM: General:  Well appearing. No respiratory difficulty HEENT: normal Neck: supple. no JVD. Carotids 2+ bilat; no bruits. No lymphadenopathy or thryomegaly appreciated. Cor: PMI nondisplaced. Regular rate & rhythm. No rubs, gallops or murmurs. Lungs: clear Abdomen: soft, nontender, nondistended. No hepatosplenomegaly. No bruits or masses. Good bowel sounds. Extremities: no cyanosis, clubbing,  rash, edema Neuro: alert & oriented x 3, cranial nerves grossly intact. moves all 4 extremities w/o difficulty. Affect pleasant.  ECG:   ASSESSMENT & PLAN:  NYHA *** GDMT  Diuretic- BB- Ace/ARB/ARNI MRA SGLT2i    Referred to HFSW (PCP, Medications, Transportation, ETOH Abuse, Drug Abuse, Insurance, Financial ): Yes or No Refer to Pharmacy: Yes or No Refer to Home Health: Yes on No Refer to Advanced Heart Failure Clinic: Yes or no  Refer to General Cardiology: Yes  or No  Follow up

## 2021-09-16 NOTE — Telephone Encounter (Signed)
Call attempted to confirm HV TOC appt today, Monday 1/30 @ 2pm. HIPPA appropriate VM left with callback number.   Pricilla Holm, MSN, RN Heart Failure Nurse Navigator [phone]

## 2021-09-18 ENCOUNTER — Telehealth (HOSPITAL_COMMUNITY): Payer: Self-pay

## 2021-09-18 ENCOUNTER — Other Ambulatory Visit (HOSPITAL_COMMUNITY): Payer: Self-pay

## 2021-09-18 NOTE — Telephone Encounter (Signed)
Transitions of Care Pharmacy  ° °Call attempted for a pharmacy transitions of care follow-up. HIPAA appropriate voicemail was left with call back information provided.  ° °Call attempt #1. Will follow-up in 2-3 days.  °  °

## 2021-09-19 ENCOUNTER — Ambulatory Visit (INDEPENDENT_AMBULATORY_CARE_PROVIDER_SITE_OTHER): Payer: Self-pay | Admitting: Primary Care

## 2021-09-19 ENCOUNTER — Telehealth (HOSPITAL_COMMUNITY): Payer: Self-pay

## 2021-09-19 NOTE — Telephone Encounter (Signed)
Transitions of Care Pharmacy   Call attempted for a pharmacy transitions of care follow-up. HIPAA appropriate voicemail was left with call back information provided.   Call attempt #2. Will follow-up in 2-3 days.    

## 2021-09-24 ENCOUNTER — Other Ambulatory Visit (HOSPITAL_COMMUNITY): Payer: Self-pay

## 2021-09-24 ENCOUNTER — Telehealth (HOSPITAL_COMMUNITY): Payer: Self-pay | Admitting: Pharmacist

## 2021-09-24 NOTE — Telephone Encounter (Signed)
Transitions of Care Pharmacy   Call attempted for a pharmacy transitions of care follow-up. Voicemail not available.  Automated message indicating "Call cannot be completed at this time."  Call attempt #3. No further follow-up.

## 2021-09-26 IMAGING — DX DG CHEST 2V
3 series · 3 of 3 positions shown · non-contrast
Comparison: None.

CLINICAL DATA: 40-year-old male with chest pain shortness of breath
and hemoptysis for 3 days.

EXAM:
CHEST - 2 VIEW

[chest pa]
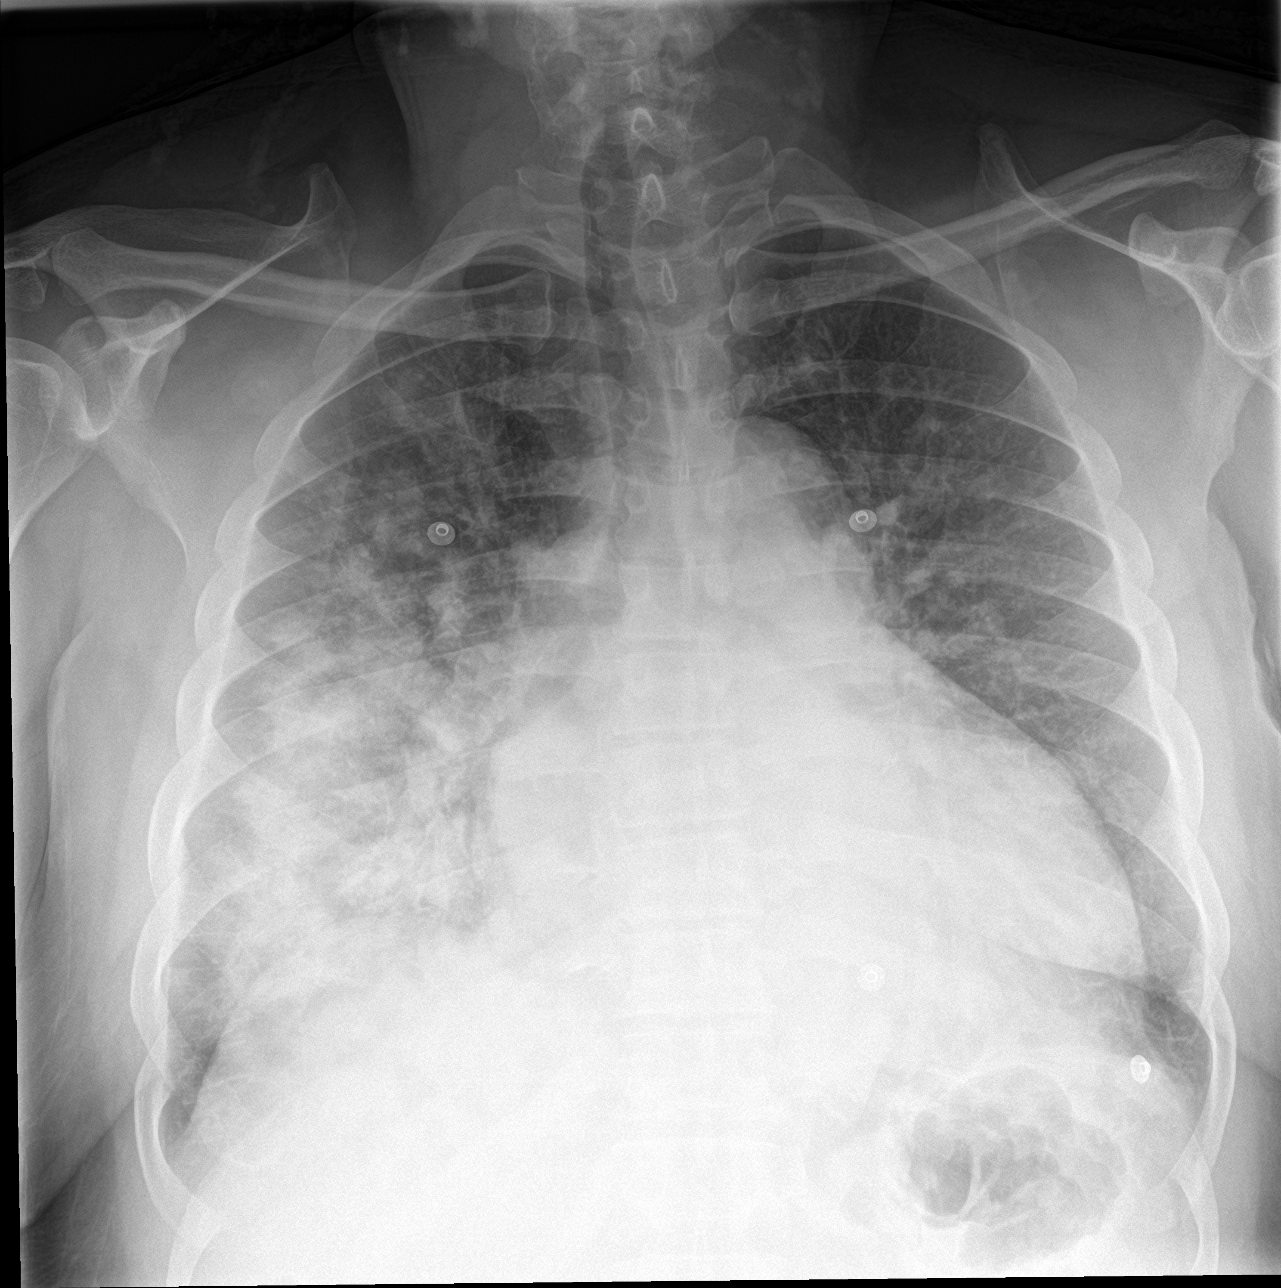

[chest lat (1 of 2)]
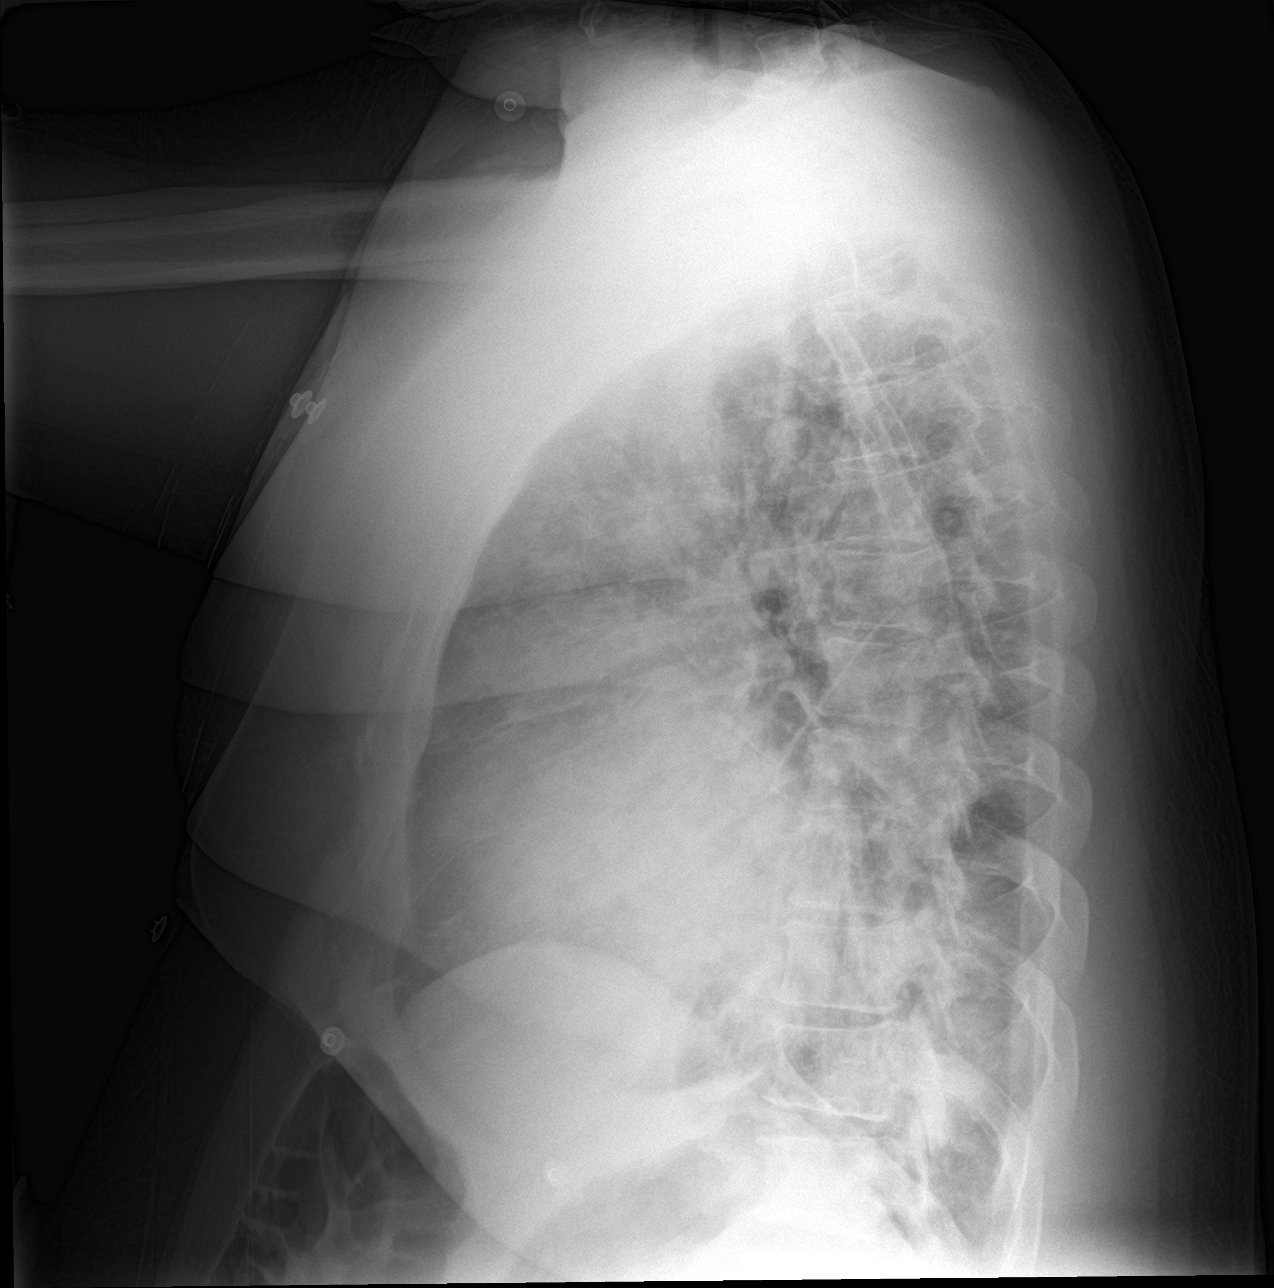

[chest lat (2 of 2)]
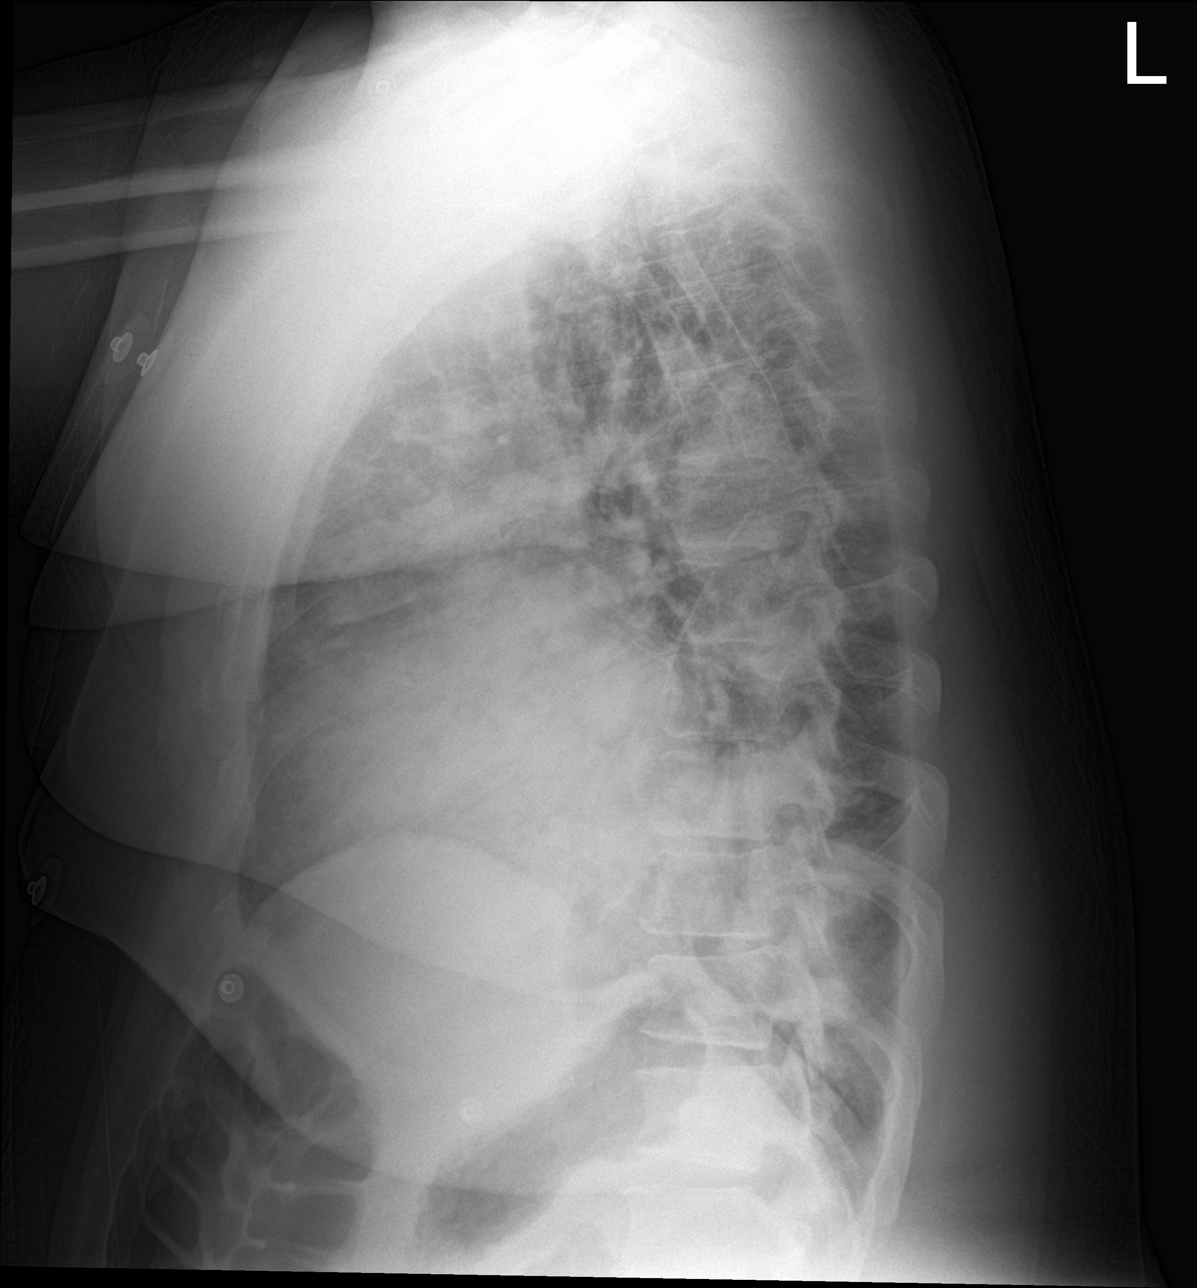

[3 of 3 positions shown; findings below may reference images not displayed]

FINDINGS: Chest radiographs 12/21/2020 and earlier.Chronic severe
cardiomegaly. Stable lung volumes and mediastinal contours.
Visualized tracheal air column is within normal limits. Widespread
and confluent but indistinct peribronchial and lower lung opacity
now on the right. No superimposed pneumothorax. Left lung
ventilation appears stable with mild atelectasis. No acute osseous
abnormality identified. Negative visible bowel gas pattern.
IMPRESSION: Progressed and now widespread right perihilar and lower lung
indistinct pulmonary opacity. Chronic severe cardiomegaly. In this
setting top differential considerations include multilobar right
pneumonia, pronounced asymmetric pulmonary edema, pulmonary
vasculitis. No pleural effusion.

## 2021-11-02 ENCOUNTER — Inpatient Hospital Stay: Payer: 59

## 2021-11-02 ENCOUNTER — Inpatient Hospital Stay
Admission: EM | Admit: 2021-11-02 | Discharge: 2021-11-05 | DRG: 291 | Disposition: A | Discharge status: Home / Self Care | Payer: 59 | Attending: Hospitalist | Admitting: Hospitalist

## 2021-11-02 ENCOUNTER — Emergency Department: Payer: 59

## 2021-11-02 ENCOUNTER — Other Ambulatory Visit: Payer: Self-pay

## 2021-11-02 DIAGNOSIS — I509 Heart failure, unspecified: Principal | ICD-10-CM

## 2021-11-02 DIAGNOSIS — Z8249 Family history of ischemic heart disease and other diseases of the circulatory system: Secondary | ICD-10-CM

## 2021-11-02 DIAGNOSIS — F17210 Nicotine dependence, cigarettes, uncomplicated: Secondary | ICD-10-CM | POA: Diagnosis present

## 2021-11-02 DIAGNOSIS — F1210 Cannabis abuse, uncomplicated: Secondary | ICD-10-CM | POA: Diagnosis present

## 2021-11-02 DIAGNOSIS — I130 Hypertensive heart and chronic kidney disease with heart failure and stage 1 through stage 4 chronic kidney disease, or unspecified chronic kidney disease: Secondary | ICD-10-CM | POA: Diagnosis present

## 2021-11-02 DIAGNOSIS — I4891 Unspecified atrial fibrillation: Secondary | ICD-10-CM | POA: Diagnosis present

## 2021-11-02 DIAGNOSIS — I161 Hypertensive emergency: Secondary | ICD-10-CM | POA: Diagnosis present

## 2021-11-02 DIAGNOSIS — N1832 Chronic kidney disease, stage 3b: Secondary | ICD-10-CM | POA: Diagnosis present

## 2021-11-02 DIAGNOSIS — Z79899 Other long term (current) drug therapy: Secondary | ICD-10-CM | POA: Diagnosis not present

## 2021-11-02 DIAGNOSIS — I252 Old myocardial infarction: Secondary | ICD-10-CM

## 2021-11-02 DIAGNOSIS — I3139 Other pericardial effusion (noninflammatory): Secondary | ICD-10-CM | POA: Diagnosis present

## 2021-11-02 DIAGNOSIS — Z6841 Body Mass Index (BMI) 40.0 and over, adult: Secondary | ICD-10-CM | POA: Diagnosis not present

## 2021-11-02 DIAGNOSIS — J9600 Acute respiratory failure, unspecified whether with hypoxia or hypercapnia: Secondary | ICD-10-CM

## 2021-11-02 DIAGNOSIS — I447 Left bundle-branch block, unspecified: Secondary | ICD-10-CM | POA: Diagnosis present

## 2021-11-02 DIAGNOSIS — I5043 Acute on chronic combined systolic (congestive) and diastolic (congestive) heart failure: Secondary | ICD-10-CM | POA: Diagnosis present

## 2021-11-02 DIAGNOSIS — I77810 Thoracic aortic ectasia: Secondary | ICD-10-CM | POA: Diagnosis present

## 2021-11-02 DIAGNOSIS — I160 Hypertensive urgency: Secondary | ICD-10-CM

## 2021-11-02 DIAGNOSIS — I480 Paroxysmal atrial fibrillation: Secondary | ICD-10-CM | POA: Diagnosis present

## 2021-11-02 DIAGNOSIS — R778 Other specified abnormalities of plasma proteins: Secondary | ICD-10-CM | POA: Diagnosis present

## 2021-11-02 DIAGNOSIS — F1410 Cocaine abuse, uncomplicated: Secondary | ICD-10-CM | POA: Diagnosis present

## 2021-11-02 DIAGNOSIS — I428 Other cardiomyopathies: Secondary | ICD-10-CM | POA: Diagnosis present

## 2021-11-02 DIAGNOSIS — Z9114 Patient's other noncompliance with medication regimen: Secondary | ICD-10-CM | POA: Diagnosis not present

## 2021-11-02 DIAGNOSIS — E66813 Obesity, class 3: Secondary | ICD-10-CM | POA: Diagnosis present

## 2021-11-02 DIAGNOSIS — Z7901 Long term (current) use of anticoagulants: Secondary | ICD-10-CM | POA: Diagnosis not present

## 2021-11-02 DIAGNOSIS — I4892 Unspecified atrial flutter: Secondary | ICD-10-CM | POA: Diagnosis present

## 2021-11-02 DIAGNOSIS — I248 Other forms of acute ischemic heart disease: Secondary | ICD-10-CM | POA: Diagnosis present

## 2021-11-02 DIAGNOSIS — Z20822 Contact with and (suspected) exposure to covid-19: Secondary | ICD-10-CM | POA: Diagnosis present

## 2021-11-02 DIAGNOSIS — N1831 Chronic kidney disease, stage 3a: Secondary | ICD-10-CM | POA: Diagnosis present

## 2021-11-02 DIAGNOSIS — R0603 Acute respiratory distress: Secondary | ICD-10-CM | POA: Insufficient documentation

## 2021-11-02 DIAGNOSIS — R7989 Other specified abnormal findings of blood chemistry: Secondary | ICD-10-CM | POA: Diagnosis present

## 2021-11-02 DIAGNOSIS — E876 Hypokalemia: Secondary | ICD-10-CM

## 2021-11-02 DIAGNOSIS — F1910 Other psychoactive substance abuse, uncomplicated: Secondary | ICD-10-CM | POA: Diagnosis present

## 2021-11-02 HISTORY — DX: Acute systolic (congestive) heart failure: I50.21

## 2021-11-02 HISTORY — DX: Acute on chronic combined systolic (congestive) and diastolic (congestive) heart failure: I50.43

## 2021-11-02 LAB — CBC
HCT: 38 % — ABNORMAL LOW (ref 39.0–52.0)
Hemoglobin: 11.4 g/dL — ABNORMAL LOW (ref 13.0–17.0)
MCH: 21.4 pg — ABNORMAL LOW (ref 26.0–34.0)
MCHC: 30 g/dL (ref 30.0–36.0)
MCV: 71.3 fL — ABNORMAL LOW (ref 80.0–100.0)
Platelets: 230 10*3/uL (ref 150–400)
RBC: 5.33 MIL/uL (ref 4.22–5.81)
RDW: 19.2 % — ABNORMAL HIGH (ref 11.5–15.5)
WBC: 8.2 10*3/uL (ref 4.0–10.5)
nRBC: 0 % (ref 0.0–0.2)

## 2021-11-02 LAB — BASIC METABOLIC PANEL
Anion gap: 8 (ref 5–15)
BUN: 26 mg/dL — ABNORMAL HIGH (ref 6–20)
CO2: 23 mmol/L (ref 22–32)
Calcium: 9 mg/dL (ref 8.9–10.3)
Chloride: 106 mmol/L (ref 98–111)
Creatinine, Ser: 1.85 mg/dL — ABNORMAL HIGH (ref 0.61–1.24)
GFR, Estimated: 47 mL/min — ABNORMAL LOW (ref >60)
Glucose, Bld: 122 mg/dL — ABNORMAL HIGH (ref 70–99)
Potassium: 3.2 mmol/L — ABNORMAL LOW (ref 3.5–5.1)
Sodium: 137 mmol/L (ref 135–145)

## 2021-11-02 LAB — LACTIC ACID, PLASMA
Lactic Acid, Venous: 1 mmol/L (ref 0.5–1.9)
Lactic Acid, Venous: 1 mmol/L (ref 0.5–1.9)

## 2021-11-02 LAB — URINE DRUG SCREEN, QUALITATIVE (ARMC ONLY)
Amphetamines, Ur Screen: NOT DETECTED
Barbiturates, Ur Screen: NOT DETECTED
Benzodiazepine, Ur Scrn: NOT DETECTED
Cannabinoid 50 Ng, Ur ~~LOC~~: POSITIVE — AB
Cocaine Metabolite,Ur ~~LOC~~: POSITIVE — AB
MDMA (Ecstasy)Ur Screen: NOT DETECTED
Methadone Scn, Ur: NOT DETECTED
Opiate, Ur Screen: NOT DETECTED
Phencyclidine (PCP) Ur S: NOT DETECTED
Tricyclic, Ur Screen: NOT DETECTED

## 2021-11-02 LAB — MAGNESIUM: Magnesium: 2.3 mg/dL (ref 1.7–2.4)

## 2021-11-02 LAB — TSH: TSH: 1.864 u[IU]/mL (ref 0.350–4.500)

## 2021-11-02 LAB — MRSA NEXT GEN BY PCR, NASAL: MRSA by PCR Next Gen: NOT DETECTED

## 2021-11-02 LAB — RESP PANEL BY RT-PCR (FLU A&B, COVID) ARPGX2
Influenza A by PCR: NEGATIVE
Influenza B by PCR: NEGATIVE
SARS Coronavirus 2 by RT PCR: NEGATIVE

## 2021-11-02 LAB — TROPONIN I (HIGH SENSITIVITY)
Troponin I (High Sensitivity): 1276 ng/L (ref <18)
Troponin I (High Sensitivity): 1242 ng/L (ref <18)
Troponin I (High Sensitivity): 1135 ng/L (ref <18)

## 2021-11-02 LAB — PROTIME-INR
INR: 1.9 — ABNORMAL HIGH (ref 0.8–1.2)
Prothrombin Time: 21.8 seconds — ABNORMAL HIGH (ref 11.4–15.2)

## 2021-11-02 LAB — PROCALCITONIN: Procalcitonin: <0.1 ng/mL

## 2021-11-02 LAB — APTT: aPTT: 60 seconds — ABNORMAL HIGH (ref 24–36)

## 2021-11-02 LAB — BRAIN NATRIURETIC PEPTIDE: B Natriuretic Peptide: 1493.3 pg/mL — ABNORMAL HIGH (ref 0.0–100.0)

## 2021-11-02 MED ORDER — FUROSEMIDE 10 MG/ML IJ SOLN
40.0000 mg | Freq: Once | INTRAMUSCULAR | Status: AC
Start: 2021-11-02 — End: 2021-11-02
  Administered 2021-11-02: 40 mg via INTRAVENOUS
  Filled 2021-11-02: qty 4

## 2021-11-02 MED ORDER — NITROGLYCERIN 2 % TD OINT
1.0000 [in_us] | TOPICAL_OINTMENT | Freq: Four times a day (QID) | TRANSDERMAL | Status: DC
  Administered 2021-11-02 (×2): 1 [in_us] via TOPICAL
  Filled 2021-11-02: qty 1

## 2021-11-02 MED ORDER — AMLODIPINE BESYLATE 5 MG PO TABS
5.0000 mg | ORAL_TABLET | Freq: Every day | ORAL | Status: DC
  Administered 2021-11-02 – 2021-11-03 (×2): 5 mg via ORAL
  Filled 2021-11-02 (×2): qty 1

## 2021-11-02 MED ORDER — ONDANSETRON HCL 4 MG PO TABS: 4.0000 mg | ORAL_TABLET | Freq: Four times a day (QID) | ORAL | Status: DC | PRN

## 2021-11-02 MED ORDER — DILTIAZEM LOAD VIA INFUSION: 10.0000 mg | Freq: Once | INTRAVENOUS | Status: DC

## 2021-11-02 MED ORDER — POTASSIUM CHLORIDE 10 MEQ/100ML IV SOLN
10.0000 meq | INTRAVENOUS | Status: DC
  Administered 2021-11-02: 10 meq via INTRAVENOUS
  Filled 2021-11-02 (×2): qty 100

## 2021-11-02 MED ORDER — AMIODARONE LOAD VIA INFUSION
150.0000 mg | Freq: Once | INTRAVENOUS | Status: AC
Start: 2021-11-02 — End: 2021-11-02
  Administered 2021-11-02: 150 mg via INTRAVENOUS
  Filled 2021-11-02: qty 83.34

## 2021-11-02 MED ORDER — POTASSIUM CHLORIDE CRYS ER 20 MEQ PO TBCR
40.0000 meq | EXTENDED_RELEASE_TABLET | Freq: Once | ORAL | Status: AC
  Administered 2021-11-02: 40 meq via ORAL
  Filled 2021-11-02: qty 2

## 2021-11-02 MED ORDER — DILTIAZEM LOAD VIA INFUSION
35.0000 mg | Freq: Once | INTRAVENOUS | Status: DC
Start: 2021-11-02 — End: 2021-11-02
  Filled 2021-11-02: qty 35

## 2021-11-02 MED ORDER — AMIODARONE LOAD VIA INFUSION
150.0000 mg | Freq: Once | INTRAVENOUS | Status: AC
  Administered 2021-11-02: 150 mg via INTRAVENOUS
  Filled 2021-11-02: qty 83.34

## 2021-11-02 MED ORDER — DILTIAZEM HCL-DEXTROSE 125-5 MG/125ML-% IV SOLN (PREMIX): 5.0000 mg/h | INTRAVENOUS | Status: DC

## 2021-11-02 MED ORDER — POTASSIUM CHLORIDE 10 MEQ/100ML IV SOLN
10.0000 meq | Freq: Once | INTRAVENOUS | Status: AC
  Administered 2021-11-02: 10 meq via INTRAVENOUS
  Filled 2021-11-02: qty 100

## 2021-11-02 MED ORDER — HYDRALAZINE HCL 50 MG PO TABS
100.0000 mg | ORAL_TABLET | Freq: Three times a day (TID) | ORAL | Status: DC
  Administered 2021-11-02 – 2021-11-05 (×8): 100 mg via ORAL
  Filled 2021-11-02 (×8): qty 2

## 2021-11-02 MED ORDER — CHLORHEXIDINE GLUCONATE CLOTH 2 % EX PADS
6.0000 | MEDICATED_PAD | Freq: Every day | CUTANEOUS | Status: DC
  Administered 2021-11-02: 6 via TOPICAL

## 2021-11-02 MED ORDER — QUETIAPINE FUMARATE 25 MG PO TABS
50.0000 mg | ORAL_TABLET | Freq: Every day | ORAL | Status: DC
  Administered 2021-11-02 – 2021-11-04 (×3): 50 mg via ORAL
  Filled 2021-11-02 (×3): qty 2

## 2021-11-02 MED ORDER — FUROSEMIDE 10 MG/ML IJ SOLN
40.0000 mg | Freq: Two times a day (BID) | INTRAMUSCULAR | Status: DC
  Administered 2021-11-02 – 2021-11-03 (×2): 40 mg via INTRAVENOUS
  Filled 2021-11-02 (×2): qty 4

## 2021-11-02 MED ORDER — RIVAROXABAN 20 MG PO TABS
20.0000 mg | ORAL_TABLET | Freq: Every day | ORAL | Status: DC
  Administered 2021-11-02 – 2021-11-04 (×4): 20 mg via ORAL
  Filled 2021-11-02 (×5): qty 1

## 2021-11-02 MED ORDER — DILTIAZEM LOAD VIA INFUSION
10.0000 mg | Freq: Once | INTRAVENOUS | Status: AC
  Administered 2021-11-02: 10 mg via INTRAVENOUS
  Filled 2021-11-02: qty 10

## 2021-11-02 MED ORDER — DILTIAZEM HCL-DEXTROSE 125-5 MG/125ML-% IV SOLN (PREMIX)
INTRAVENOUS | Status: AC
  Filled 2021-11-02: qty 125

## 2021-11-02 MED ORDER — ONDANSETRON HCL 4 MG/2ML IJ SOLN: 4.0000 mg | Freq: Four times a day (QID) | INTRAMUSCULAR | Status: DC | PRN

## 2021-11-02 MED ORDER — DILTIAZEM HCL-DEXTROSE 125-5 MG/125ML-% IV SOLN (PREMIX)
5.0000 mg/h | INTRAVENOUS | Status: DC
  Administered 2021-11-02: 5 mg/h via INTRAVENOUS

## 2021-11-02 MED ORDER — ACETAMINOPHEN 650 MG RE SUPP
650.0000 mg | Freq: Four times a day (QID) | RECTAL | Status: DC | PRN
  Filled 2021-11-02: qty 2

## 2021-11-02 MED ORDER — AMIODARONE HCL IN DEXTROSE 360-4.14 MG/200ML-% IV SOLN
60.0000 mg/h | INTRAVENOUS | Status: DC
  Administered 2021-11-02: 60 mg/h via INTRAVENOUS
  Filled 2021-11-02 (×2): qty 200

## 2021-11-02 MED ORDER — ISOSORBIDE MONONITRATE ER 60 MG PO TB24
120.0000 mg | ORAL_TABLET | Freq: Every day | ORAL | Status: DC
Start: 2021-11-02 — End: 2021-11-05
  Administered 2021-11-02 – 2021-11-05 (×4): 120 mg via ORAL
  Filled 2021-11-02 (×2): qty 2
  Filled 2021-11-02 (×2): qty 4

## 2021-11-02 MED ORDER — AMIODARONE HCL IN DEXTROSE 360-4.14 MG/200ML-% IV SOLN
30.0000 mg/h | INTRAVENOUS | Status: DC
  Administered 2021-11-02 – 2021-11-04 (×7): 30 mg/h via INTRAVENOUS
  Filled 2021-11-02 (×6): qty 200

## 2021-11-02 MED ORDER — ACETAMINOPHEN 325 MG PO TABS
650.0000 mg | ORAL_TABLET | Freq: Four times a day (QID) | ORAL | Status: DC | PRN
  Administered 2021-11-03 (×3): 650 mg via ORAL
  Filled 2021-11-02 (×4): qty 2

## 2021-11-02 MED ORDER — EMPAGLIFLOZIN 10 MG PO TABS
10.0000 mg | ORAL_TABLET | Freq: Every day | ORAL | Status: DC
  Administered 2021-11-02 – 2021-11-05 (×4): 10 mg via ORAL
  Filled 2021-11-02 (×4): qty 1

## 2021-11-02 NOTE — Progress Notes (Signed)
Pt unable to tolerate peripheral potassium IV.... states it was burning and painful.... had me take it out and would rather have PO.... Notified MD.  ?

## 2021-11-02 NOTE — Assessment & Plan Note (Addendum)
Secondary to CHf exacerbation.  Needed BiPAP initially for work of breathing.  No documented hypoxia.  Dyspnea improved with diuresis. ?

## 2021-11-02 NOTE — H&P (Signed)
?History and Physical  ? ? ?Patient: Gerald [name] YTK:[id number] DOB: May 25, 1981 ?DOA: 11/02/2021 ?DOS: the patient was seen and examined on 11/02/2021 ?PCP: Patient, [name] (Inactive)  ?Patient coming from: Home ? ?Chief Complaint:  ?Chief Complaint  ?Patient presents with  ? Chest Pain  ? ? ?HPI: Gerald [name] is a 41 y.o. male with medical history significant for nonischemic cardiomyopathy, EF of 20%, CKD stage IIIa baseline creatinine 1.8, history of A-fib flutter, presents to the ER today with elevated blood pressure, chest pain, near syncope and shortness of breath.  Patient states he ran out of all his medication except for Xarelto and hydrochlorothiazide ? ?Patient reports that he ran out of his medications last week.  Reportedly he took his Xarelto yesterday.  Blood pressure extremely elevated. ? ?On admission the ER, temp 97.3 heart rate 144 blood pressure 157/129 satting 97% on room air. ? ?Laboratory evaluation: ? ?BNP elevated at 1493 ? ?COVID negative, flu negative ? ?Sodium 137 potassium 3.2 BUN of 26 creatinine 1.8 glucose of 122 ? ?White count 8.2 hemoglobin 11.4 platelets 230.  Lactic acid normal and procalcitonin less than 0.1 ? ?Initial troponin of 1276 ? ?EKG shows rapid A-fib, left bundle branch block.  Left bundle branch block is old. ? ?Patient started on Cardizem drip.  Placed on BiPAP to decrease work of breathing ? ?Chest x-ray showed multifocal opacity in the right lung concerning for pneumonia. ? ?[facility] hospitalist contacted for admission. ? ?Review of Systems: As mentioned in the history of present illness. All other systems reviewed and are negative. ?Past Medical History:  ?Diagnosis Date  ? Acute on chronic combined systolic and diastolic CHF (congestive heart failure) ([place])   ? Acute on chronic systolic CHF (congestive heart failure) ([place]) 07/05/2020  ? Acute systolic congestive heart failure ([place])   ? Atrial flutter, paroxysmal ([place])   ? CHF (congestive heart failure) ([place])  03/30/2021  ? Chronic combined systolic and diastolic CHF (congestive heart failure) ([place])   ? Chronic kidney disease, stage III (moderate) ([name])   ? Dysrhythmia   ? brief episode afib, cardioverted did not return  ? Hypertension   ? Hypertensive crisis 01/20/2021  ? Hypertensive emergency 12/21/2020  ? NSTEMI (non-ST elevated myocardial infarction) ([place]) 12/21/2015  ? Polysubstance abuse ([place])   ? ?Past Surgical History:  ?Procedure Laterality Date  ? KNEE SURGERY Right   ? LEFT HEART CATH AND CORONARY ANGIOGRAPHY N/A 12/21/2020  ? Procedure: LEFT HEART CATH AND CORONARY ANGIOGRAPHY;  Surgeon: Martinique, Peter M, MD;  Location: [facility];  Service: Cardiovascular;  Laterality: N/A;  ? TEE WITHOUT CARDIOVERSION N/A 04/04/2019  ? Procedure: TRANSESOPHAGEAL ECHOCARDIOGRAM (TEE);  Surgeon: [name], MD;  Location: [facility];  Service: Cardiovascular;  Laterality: N/A;  ? ?Social History:  reports that he has been smoking cigarettes. He has a 21.00 pack-year smoking history. He has never used smokeless tobacco. He reports current alcohol use of about 6.0 standard drinks per week. He reports current drug use. Drugs: Cocaine and Marijuana. ? ?No Known Allergies ? ?Family History  ?Problem Relation Age of Onset  ? Hypertension Father   ? Heart disease Paternal Grandfather   ? ? ?Prior to Admission medications   ?Medication Sig Start Date End Date Taking? Authorizing Provider  ?amLODipine (NORVASC) 5 MG tablet Take 1 tablet (5 mg total) by mouth daily. 09/09/21 10/09/21  Sanjuan Dame, MD  ?atorvastatin (LIPITOR) 40 MG tablet Take 1 tablet (40 mg total)  by mouth daily. 09/09/21 10/09/21  Sanjuan Dame, MD  ?carvedilol (COREG) 12.5 MG tablet Take 1 tablet (12.5 mg total) by mouth 2 (two) times daily with a meal. 09/09/21 10/09/21  Sanjuan Dame, MD  ?empagliflozin (JARDIANCE) 10 MG TABS tablet Take 1 tablet (10 mg total) by mouth daily. 09/09/21   Sanjuan Dame, MD  ?hydrALAZINE (APRESOLINE) 100 MG tablet Take 1  tablet (100 mg total) by mouth every 8 (eight) hours. 09/09/21 10/09/21  Sanjuan Dame, MD  ?isosorbide mononitrate (IMDUR) 120 MG 24 hr tablet Take 1 tablet (120 mg total) by mouth daily. 09/09/21 10/09/21  Sanjuan Dame, MD  ?losartan (COZAAR) 50 MG tablet Take 1 tablet (50 mg total) by mouth daily. 09/09/21 10/09/21  Sanjuan Dame, MD  ?potassium chloride (KLOR-CON M) 10 MEQ tablet Take 1 tablet (10 mEq total) by mouth daily. 09/09/21 10/09/21  Dorethea Clan, DO  ?rivaroxaban (XARELTO) 20 MG TABS tablet Take 1 tablet (20 mg total) by mouth daily with supper. 09/09/21 10/09/21  Sanjuan Dame, MD  ?spironolactone (ALDACTONE) 50 MG tablet Take 1 tablet (50 mg total) by mouth daily. 09/09/21 10/09/21  Sanjuan Dame, MD  ?torsemide (DEMADEX) 20 MG tablet Take 1 tablet (20 mg total) by mouth daily. 09/09/21 10/09/21  Dorethea Clan, DO  ? ? ?Physical Exam: ?Vitals:  ? 11/02/21 0330 11/02/21 0354 11/02/21 0355 11/02/21 0400  ?BP: (!) 214/202 (!) 159/82  (!) 143/105  ?Pulse: (!) 43   83  ?Resp: (!) 28 (!) 40  (!) 21  ?Temp:      ?TempSrc:      ?SpO2: 97%  93% 99%  ?Weight:      ?Height:      ? ? ? ?General:          Awake, appears in mild distress, sitting upright, tachypnic with increased work of breathing, speaking in short sentences, on Bipap ?CV:                  tachycardia rate approximately 140, irregular. ?Resp:               Mild tachypnea, slight crackles in the lower bases bilaterally.  No noted cough.  No wheezing. ?Abd:                 No distention.  ?Other:              Moderate pitting edema to the level of the mid shins bilaterally. ?  ?Data Reviewed: ?Relevant notes from primary care and specialist visits, past discharge summaries as available in EHR, including Care Everywhere. ?Prior diagnostic testing as pertinent to current admission diagnoses ?Updated medications and problem lists for reconciliation ?ED course, including vitals, labs, imaging, treatment and response to treatment ?Triage  notes, nursing and pharmacy notes and ED provider's notes ?Notable results as noted in HPI ? ? ?Assessment and Plan: ?* Atrial fibrillation with rapid ventricular response ([name]) ? IV amiodarone gtts. Continue with xarelto.  ?Cardiology consult in the AM.  ?replete K to keep serum K above 4.0. check serum Mag. Keep serum mag >2.0.  ? ? ?Hypertensive emergency ?Start NTG paste. IV lasix for diuresis. May need NTG gtts. ? ?Elevated troponin ?Trend troponin. May be due to demand ischemia from rapid afib. ? ?Acute on chronic combined systolic and diastolic CHF (congestive heart failure) ([place]) ?IV Lasix and continue GDMT ?Daily weights with intake and output monitoring ? ?Acute respiratory failure ([place]) ?Secondary to CHf exacerbation ?Continue BIpap ? ?Obesity, Class III,  BMI 40-49.9 (morbid obesity) ([place]) ?Chronic. ? ?Polysubstance abuse ([place]) ?Check UDS to make sure he is not positive for cocaine. Would complicate his treatment with betablockers. ? ?Stage 3a chronic kidney disease (CKD) ([name]) - baseline SCr 1.8-2.0 ?Chronic. ? ? ? ? ? ? ?Advance Care Planning:   Code Status: Prior  ? ?Consults: [place] cardiology, [facility] ? ?Family Communication: none ? ?Severity of Illness: ?The appropriate patient status for this patient is INPATIENT. Inpatient status is judged to be reasonable and necessary in order to provide the required intensity of service to ensure the patient's safety. The patient's presenting symptoms, physical exam findings, and initial radiographic and laboratory data in the context of their chronic comorbidities is felt to place them at high risk for further clinical deterioration. Furthermore, it is not anticipated that the patient will be medically stable for discharge from the hospital within 2 midnights of admission.  ? ?* I certify that at the point of admission it is my clinical judgment that the patient will require inpatient hospital care spanning beyond 2 midnights from the point of admission due  to high intensity of service, high risk for further deterioration and high frequency of surveillance required.* ? ?Author: ?Athena Masse, MD ?11/02/2021 4:28 AM ? ?For on call review [email or web address].

## 2021-11-02 NOTE — Assessment & Plan Note (Addendum)
--  UDS pos for cocaine and cannabinoid ?

## 2021-11-02 NOTE — Progress Notes (Addendum)
Cross Cover ?Amio bolus given earlier ineffective for improving rate. Remains a fib RVR 130-150 per nurse report. Currently 129. Per Dr. Radford Pax, start diltiazem now.  ?0.25 mg per kg load 36 mg, rounded to 35 and ordered with continuous infusion to titrate ? ?APDATE [id number] PM Nurse informs me now ST with rate low 100's.before diltiazem initiated. Dilt discontinue. EKG to confirm SR ? ?

## 2021-11-02 NOTE — Plan of Care (Signed)
Pt admitted to ICU ~0615. ANOx4. Pt c/o 4/10 chest pain/pressure. States this pain has improved from 8/10 since ED. HR 120s-140s; afib w/LBBB. BP 160s/120s. Remains on amio gtt @60mg /hr. Pt reports SOB improved after BIPAP.  ? ?Problem: Education: ?Goal: Knowledge of disease or condition will improve ?Outcome: Progressing ?Goal: Understanding of medication regimen will improve ?Outcome: Progressing ?Goal: Individualized Educational Video(s) ?Outcome: Progressing ?  ?Problem: Activity: ?Goal: Ability to tolerate increased activity will improve ?Outcome: Progressing ?  ?Problem: Cardiac: ?Goal: Ability to achieve and maintain adequate cardiopulmonary perfusion will improve ?Outcome: Progressing ?  ?Problem: Health Behavior/Discharge Planning: ?Goal: Ability to safely manage health-related needs after discharge will improve ?Outcome: Progressing ?  ?Problem: Education: ?Goal: Knowledge of General Education information will improve ?Description: Including pain rating scale, medication(s)/side effects and non-pharmacologic comfort measures ?Outcome: Progressing ?  ?Problem: Health Behavior/Discharge Planning: ?Goal: Ability to manage health-related needs will improve ?Outcome: Progressing ?  ?Problem: Clinical Measurements: ?Goal: Ability to maintain clinical measurements within normal limits will improve ?Outcome: Progressing ?Goal: Will remain free from infection ?Outcome: Progressing ?Goal: Diagnostic test results will improve ?Outcome: Progressing ?Goal: Respiratory complications will improve ?Outcome: Progressing ?Goal: Cardiovascular complication will be avoided ?Outcome: Progressing ?  ?Problem: Activity: ?Goal: Risk for activity intolerance will decrease ?Outcome: Progressing ?  ?Problem: Nutrition: ?Goal: Adequate nutrition will be maintained ?Outcome: Progressing ?  ?Problem: Coping: ?Goal: Level of anxiety will decrease ?Outcome: Progressing ?  ?Problem: Elimination: ?Goal: Will not experience complications  related to bowel motility ?Outcome: Progressing ?Goal: Will not experience complications related to urinary retention ?Outcome: Progressing ?  ?Problem: Pain Managment: ?Goal: General experience of comfort will improve ?Outcome: Progressing ?  ?Problem: Safety: ?Goal: Ability to remain free from injury will improve ?Outcome: Progressing ?  ?Problem: Skin Integrity: ?Goal: Risk for impaired skin integrity will decrease ?Outcome: Progressing ?  ?

## 2021-11-02 NOTE — ED Triage Notes (Signed)
Pt states has htn, "through the roof", ran out of his medications last week. Pt states is having chest pain, lightheadedness, near syncope and shortness of breath.  ?

## 2021-11-02 NOTE — Progress Notes (Signed)
VO given for Amio 150 mg bolus by Dr. Eber Hong. [place]. ?

## 2021-11-02 NOTE — Consult Note (Addendum)
?Cardiology Consultation:  ? ?Patient ID: Gerald [name] ?MRN: [id number]; DOB: 1981-05-28 ? ?Admit date: 11/02/2021 ?Date of Consult: 11/02/2021 ? ?Primary Care Provider: Patient, [name] (Inactive) ?[facility] Cardiologist: Ida Rogue, MD  ?[place] Electrophysiologist:  None  ? ? ?Patient Profile:  ? ?Gerald [name] is a 41 y.o. 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 ? ?History of Present Illness:  ? ?Mr. Gerald [name]  is a 41 y.o. 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   He has had multiple admission for CHF exacerbation in setting of medication noncompliance.  Continue to use tobacco and cocaine.  His last hospitalization was in January 2023 with severe uncontrolled hypertension, type [name] NSTEMI, acute on chronic combined systolic and diastolic CHF.  At that time his dry weight was 285 pounds and he was up 10 pounds on admission.  He was diuresed with IV Lasix.  He was discharged home on losartan 50 mg daily, amlodipine 5 mg daily, carvedilol 12.5 mg twice daily, hydralazine 100 mg every 8 hours times, Imdur 120 mg daily, torsemide 20 mg daily, Jardiance 10 mg daily and spironolactone 50 mg daily.  During that hospitalization his troponin peaked at 900 felt to be demand ischemia in the setting of hypertensive urgency.  Urine drug screen on that admission was positive for opiates. ? ?He tells me that about 4 days ago he completely ran out of his medications  except for Xarelto and HCTZ.  Around the same time he started having shortness of breath and chest heaviness as well as palpitations to the point he could not lay down and sleep at night because his heart was racing so fast.  He presented to the emergency room where he was found to be in atrial fibrillation with RVR at heart rate 144 bpm and hypertensive urgency with a blood pressure 157/129 mmHg.  BNP was elevated at 1493.  He was COVID and flu negative.  Potassium was low at 3.2 and serum creatinine was 1.8.  Hemoglobin 11.4 platelet count 230.  Initial troponin was 1276.  Twelve-lead EKG showed atrial fibrillation with RVR in the setting of left lateral branch block.  His left bundle branch block is old.  He was started on IV Cardizem drip and BiPAP.  Chest x-ray showed multifocal opacity in the right lung concerning for pneumonia and was started on antibiotics per [facility].  Urine drug screen was positive for cocaine and marijuana.  Cardiology is now asked to help with management for heart failure and A-fib with RVR.  His IV Cardizem was stopped and he was placed on IV amiodarone drip to help control heart rate. ? ? ?Past Medical History:  ?Diagnosis Date  ? Acute on chronic combined systolic and diastolic CHF (congestive heart failure) ([place])   ? Acute on chronic systolic CHF (congestive heart failure) ([place]) 07/05/2020  ? Acute systolic congestive heart failure ([place])   ? Atrial flutter, paroxysmal ([place])   ? CHF (congestive heart failure) ([place]) 03/30/2021  ? Chronic combined systolic and diastolic CHF (congestive  heart failure) ([place])   ? Chronic kidney disease, stage III (moderate) ([name])   ? Dysrhythmia   ? brief episode afib, cardioverted did not return  ? Hypertension   ? Hypertensive crisis 01/20/2021  ? Hypertensive emergency 12/21/2020  ? NSTEMI (non-ST elevated myocardial infarction) ([place]) 12/21/2015  ? Polysubstance abuse ([place])   ? ? ?Past Surgical History:  ?Procedure Laterality Date  ? KNEE SURGERY Right   ? LEFT  HEART CATH AND CORONARY ANGIOGRAPHY N/A 12/21/2020  ? Procedure: LEFT HEART CATH AND CORONARY ANGIOGRAPHY;  Surgeon: Martinique, Peter M, MD;  Location: [facility];  Service: Cardiovascular;  Laterality: N/A;  ? TEE WITHOUT CARDIOVERSION N/A 04/04/2019  ? Procedure: TRANSESOPHAGEAL ECHOCARDIOGRAM (TEE);  Surgeon: [name], MD;  Location: [facility];  Service: Cardiovascular;  Laterality: N/A;  ?  ? ?Home Medications:  ?Prior to Admission medications   ?Medication Sig Start Date End Date Taking? Authorizing Provider  ?amLODipine (NORVASC) 5 MG tablet Take 1 tablet (5 mg total) by mouth daily. 09/09/21 10/09/21  Sanjuan Dame, MD  ?atorvastatin (LIPITOR) 40 MG tablet Take 1 tablet (40 mg total) by mouth daily. 09/09/21 10/09/21  Sanjuan Dame, MD  ?carvedilol (COREG) 12.5 MG tablet Take 1 tablet (12.5 mg total) by mouth 2 (two) times daily with a meal. 09/09/21 10/09/21  Sanjuan Dame, MD  ?empagliflozin (JARDIANCE) 10 MG TABS tablet Take 1 tablet (10 mg total) by mouth daily. ?Patient not taking: Reported on 11/02/2021 09/09/21   Sanjuan Dame, MD  ?hydrALAZINE (APRESOLINE) 100 MG tablet Take 1 tablet (100 mg total) by mouth every 8 (eight) hours. 09/09/21 10/09/21  Sanjuan Dame, MD  ?isosorbide mononitrate (IMDUR) 120 MG 24 hr tablet Take 1 tablet (120 mg total) by mouth daily. 09/09/21 10/09/21  Sanjuan Dame, MD  ?losartan (COZAAR) 50 MG tablet Take 1 tablet (50 mg total) by mouth daily. 09/09/21 10/09/21  Sanjuan Dame, MD  ?potassium chloride (KLOR-CON M) 10 MEQ tablet Take 1 tablet (10 mEq total) by mouth daily. 09/09/21 10/09/21  Dorethea Clan, DO  ?rivaroxaban (XARELTO) 20 MG TABS tablet Take 1 tablet (20 mg total) by mouth daily with supper. 09/09/21 10/09/21  Sanjuan Dame, MD  ?spironolactone (ALDACTONE) 50 MG tablet Take 1 tablet (50 mg total) by mouth daily. 09/09/21 10/09/21  Sanjuan Dame, MD  ?torsemide (DEMADEX) 20 MG tablet Take 1 tablet (20 mg total) by mouth daily.  09/09/21 10/09/21  Dorethea Clan, DO  ? ? ?Inpatient Medications: ?Scheduled Meds: ? furosemide  40 mg Intravenous Q12H  ? nitroGLYCERIN  1 inch Topical Q6H  ? rivaroxaban  20 mg Oral Q supper  ? ?Continuous Infusions: ? amiodarone 30 mg/hr (11/02/21 1300)  ? ?PRN Meds: ?acetaminophen **OR** acetaminophen, ondansetron **OR** ondansetron (ZOFRAN) IV ? ?Allergies:   No Known Allergies ? ?Social History:   ?Social History  ? ?Socioeconomic History  ? Marital status: Single  ?  Spouse name: Not on file  ? Number of children: 3  ? Years of education: Not on file  ? Highest education level: 11th grade  ?Occupational History  ? 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 Smoking status: Every Day  ?  Packs/day: 0.75  ?  Years: 28.00  ?  Pack years: 21.00  ?  Types: Cigarettes  ? Smokeless tobacco: Never  ? Tobacco comments:  ?  declines patch  ?Vaping Use  ? Vaping Use: Never used  ?Substance and Sexual Activity  ? Alcohol use: Yes  ?  Alcohol/week: 6.0 standard drinks  ?  Types: 6 Shots of liquor per week  ?  Comment: weekends  ? Drug use: Yes  ?  Types: Cocaine, Marijuana  ?  Comment: Cocaine- last use 08/18/2021.  ? Sexual activity: Yes  ?  Partners: Female  ?  Birth control/protection: Condom  ?Other Topics Concern  ? Not on file  ?Social History Narrative  ? Not on file  ? ?Social Determinants of Health  ? ?Financial Resource Strain: High Risk  ? Difficulty of Paying Living Expenses: Hard  ?Food Insecurity: No Food Insecurity  ? Worried About Charity fundraiser in the Last Year: Never true  ? Ran Out of Food in the Last Year: Never true  ?Transportation Needs: No Transportation Needs  ? Lack of Transportation (Medical): No  ? Lack of Transportation (Non-Medical): No  ?Physical Activity: Not on file  ?Stress: Not on file  ?Social Connections: Not on file  ?Intimate Partner Violence: Not on file  ?  ?Family History:   ? ?Family History  ?Problem Relation Age of Onset  ? Hypertension Father   ? Heart disease  Paternal Grandfather   ?  ? ?ROS:  ?Please see the history of present illness.  ? ?All other ROS reviewed and negative.    ? ?Physical Exam/Data:  ? ?Vitals:  ? 11/02/21 1400 11/02/21 1500 11/02/21 1534 11/02/21 16

## 2021-11-02 NOTE — Assessment & Plan Note (Signed)
Hold off on his GDMT meds until his HTN and rapid afib are better controlled. ?

## 2021-11-02 NOTE — Plan of Care (Addendum)
~  1930: pt continues to be in afib w/RVR rates 120s-140s. Pt very anxious. BP 150s/110s. NP Randol Kern notified. Orders received for dilt bolus and gtt.  ? ?~2100: Pt spontaneously converted to ST w/BBB. NP morrison notified. Orders received to d/c dilt bolus/gtt and EKG.  ? ?ANOx4. BP 160s/100s. Afeb. 1.6 L UOP after AM lasix dose. Pt reports 3/10 headache. PRN tylenol given to good effect.  ?Problem: Education: ?Goal: Knowledge of disease or condition will improve ?Outcome: Progressing ?Goal: Understanding of medication regimen will improve ?Outcome: Progressing ?Goal: Individualized Educational Video(s) ?Outcome: Progressing ?  ?Problem: Activity: ?Goal: Ability to tolerate increased activity will improve ?Outcome: Progressing ?  ?Problem: Cardiac: ?Goal: Ability to achieve and maintain adequate cardiopulmonary perfusion will improve ?Outcome: Progressing ?  ?Problem: Health Behavior/Discharge Planning: ?Goal: Ability to safely manage health-related needs after discharge will improve ?Outcome: Progressing ?  ?Problem: Education: ?Goal: Knowledge of General Education information will improve ?Description: Including pain rating scale, medication(s)/side effects and non-pharmacologic comfort measures ?Outcome: Progressing ?  ?Problem: Health Behavior/Discharge Planning: ?Goal: Ability to manage health-related needs will improve ?Outcome: Progressing ?  ?Problem: Clinical Measurements: ?Goal: Ability to maintain clinical measurements within normal limits will improve ?Outcome: Progressing ?Goal: Will remain free from infection ?Outcome: Progressing ?Goal: Diagnostic test results will improve ?Outcome: Progressing ?Goal: Respiratory complications will improve ?Outcome: Progressing ?Goal: Cardiovascular complication will be avoided ?Outcome: Progressing ?  ?Problem: Activity: ?Goal: Risk for activity intolerance will decrease ?Outcome: Progressing ?  ?Problem: Nutrition: ?Goal: Adequate nutrition will be  maintained ?Outcome: Progressing ?  ?Problem: Coping: ?Goal: Level of anxiety will decrease ?Outcome: Progressing ?  ?Problem: Elimination: ?Goal: Will not experience complications related to bowel motility ?Outcome: Progressing ?Goal: Will not experience complications related to urinary retention ?Outcome: Progressing ?  ?Problem: Pain Managment: ?Goal: General experience of comfort will improve ?Outcome: Progressing ?  ?Problem: Safety: ?Goal: Ability to remain free from injury will improve ?Outcome: Progressing ?  ?Problem: Skin Integrity: ?Goal: Risk for impaired skin integrity will decrease ?Outcome: Progressing ?  ?

## 2021-11-02 NOTE — Subjective & Objective (Signed)
Chief complaint: Elevated blood pressure ?History present illness: ?41 year old African-American male history of nonischemic cardiomyopathy, EF of 20%, CKD stage IIIa baseline creatinine 1.8, history of A-fib flutter, presents to the ER today with elevated blood pressure, chest pain, near syncope and shortness of breath. ? ?Patient reports that he ran out of his medications last week.  Reportedly he took his Xarelto yesterday.  Blood pressure extremely elevated. ? ?On admission the ER, temp 97.3 heart rate 144 blood pressure 157/129 satting 97% on room air. ? ?Laboratory evaluation: ? ?BNP elevated at 1493 ? ?COVID negative, flu negative ? ?Sodium 137 potassium 3.2 BUN of 26 creatinine 1.8 glucose of 122 ? ?White count 8.2 hemoglobin 11.4 platelets 230 ? ?Initial troponin of 1276 ? ?EKG shows rapid A-fib, left bundle branch block.  Left bundle branch block is old. ? ?Patient started on Cardizem drip. ? ?Chest x-ray showed multifocal opacity in the right lung concerning for pneumonia. ? ?[facility] hospitalist contacted for admission. ?

## 2021-11-02 NOTE — Assessment & Plan Note (Deleted)
Start NTG paste. IV lasix for diuresis. May need NTG gtts. ?

## 2021-11-02 NOTE — Assessment & Plan Note (Addendum)
--  LVEF 20-25% in Jan 2023 Echo, with grade II DD. ?-Patient with history of multiple CHF exacerbation in setting of medication noncompliance?and ongoing polysubstance abuse especially with cocaine. ?--CXR showed Multifocal opacity predominantly within the right lung.  Given no signs of infection, neg procal and BNP elevated at 1493, more likely pulm edema than PNA. ?Plan: ?--cont IV lasix 40 mg BID ?--cont Jardiance 10 mg daily ?--Carvedilol currently on hold for now until Cocaine washes out and then restart Carvedilol (only beta blocker that cardio would recommend due to alpha blockade as well if he does further cocaine) ?--Home dose of Spiro on hold due to AKI on CKD ?

## 2021-11-02 NOTE — Assessment & Plan Note (Addendum)
2/2 demand ischemia ?

## 2021-11-02 NOTE — Assessment & Plan Note (Signed)
Chronic. 

## 2021-11-02 NOTE — ED Provider Notes (Signed)
? ?[facility] ?Provider Note ? ? ? Event Date/Time  ? First MD Initiated Contact with Patient 11/02/21 0222   ?  (approximate) ? ? ?History  ? ?Chest Pain ? ? ?HPI ? ?Gerald [name] is a 41 y.o. male whom from admission note from January 2023 past medical history of uncontrolled hypertension, nonischemic cardiomyopathy resulting in heart failure with reduced ejection fraction, CKD stage IIIb, A. fib, obesity substance use disorder ? ?Patient reports he ran out of almost all of his medications 48 hours ago.  He did take his Xarelto 24 hours ago as well.  Has been experiencing increasing shortness of breath racing heart and chest tightness since then ? ?Reports he had the same thing happened before, something similar in January.  Mild swelling his legs feel slightly swollen ? ?No fevers.  Denies cough or congestion.  Reports a tightness in his chest over the last 24 hours ? ?  ? ? ?Physical Exam  ? ?Triage Vital Signs: ?ED Triage Vitals  ?Enc Vitals Group  ?   BP 11/02/21 0214 (!) 157/129  ?   Pulse Rate 11/02/21 0214 (!) 144  ?   Resp 11/02/21 0213 (!) 24  ?   Temp 11/02/21 0214 (!) 97.3 ?F (36.3 ?C)  ?   Temp Source 11/02/21 0213 Oral  ?   SpO2 11/02/21 0214 97 %  ?   Weight 11/02/21 0214 290 lb (131.5 kg)  ?   Height 11/02/21 0214 5\' 11"  (1.803 m)  ?   Head Circumference --   ?   Peak Flow --   ?   Pain Score 11/02/21 0213 8  ?   Pain Loc --   ?   Pain Edu? --   ?   Excl. in [place]? --   ? ? ?Most recent vital signs: ?Vitals:  ? 11/02/21 0515 11/02/21 0555  ?BP: (!) 153/123 (!) 161/126  ?Pulse: (!) 44 (!) 129  ?Resp: (!) 28 (!) 31  ?Temp:  97.7 ?F (36.5 ?C)  ?SpO2: 100% 97%  ? ? ? ?General: Awake, appears in mild distress, sitting upright, appears slightly dyspneic.  Speaks in full sentences however.  No oxygen requirement ?CV:  Good peripheral perfusion.  Moderate tachycardia rate approximately 140, irregular. ?Resp:  Mild tachypnea, slight crackles in the lower bases bilaterally.  No noted  cough.  No wheezing. ?Abd:  No distention.  ?Other:  Moderate pitting edema to the level of the mid shins bilaterally. ? ? ?ED Results / Procedures / Treatments  ? ?Labs ?(all labs ordered are listed, but only abnormal results are displayed) ?Labs Reviewed  ?BASIC METABOLIC PANEL - Abnormal; Notable for the following components:  ?    Result Value  ? Potassium 3.2 (*)   ? Glucose, Bld 122 (*)   ? BUN 26 (*)   ? Creatinine, Ser 1.85 (*)   ? GFR, Estimated 47 (*)   ? All other components within normal limits  ?CBC - Abnormal; Notable for the following components:  ? Hemoglobin 11.4 (*)   ? HCT 38.0 (*)   ? MCV 71.3 (*)   ? MCH 21.4 (*)   ? RDW 19.2 (*)   ? All other components within normal limits  ?BRAIN NATRIURETIC PEPTIDE - Abnormal; Notable for the following components:  ? B Natriuretic Peptide 1,493.3 (*)   ? All other components within normal limits  ?PROTIME-INR - Abnormal; Notable for the following components:  ? Prothrombin Time 21.8 (*)   ?  INR 1.9 (*)   ? All other components within normal limits  ?APTT - Abnormal; Notable for the following components:  ? aPTT 60 (*)   ? All other components within normal limits  ?URINE DRUG SCREEN, QUALITATIVE ([facility] ONLY) - Abnormal; Notable for the following components:  ? Cocaine Metabolite,Ur Coal POSITIVE (*)   ? Cannabinoid 50 Ng, Ur  POSITIVE (*)   ? All other components within normal limits  ?TROPONIN I (HIGH SENSITIVITY) - Abnormal; Notable for the following components:  ? Troponin I (High Sensitivity) 1,276 (*)   ? All other components within normal limits  ?TROPONIN I (HIGH SENSITIVITY) - Abnormal; Notable for the following components:  ? Troponin I (High Sensitivity) 1,242 (*)   ? All other components within normal limits  ?RESP PANEL BY RT-PCR (FLU A&B, COVID) [id number]  ?CULTURE, BLOOD (ROUTINE X 2)  ?CULTURE, BLOOD (ROUTINE X 2)  ?MAGNESIUM  ?PROCALCITONIN  ?LACTIC ACID, PLASMA  ?LACTIC ACID, PLASMA  ?BASIC METABOLIC PANEL  ?TSH  ? ? ? ?EKG ? ?Initial EKG is  reviewed at 222 ?Heart rate 135 ?QRS 179 QTc 570 ?Left bundle branch block atrial fibrillation with rapid ventricular response ? ?Repeat EKG reviewed at 3 AM ?Heart rate 140 ?QRS 170 ?QTc 560 ?Atrial fibrillation left bundle branch block. ? ? ?Given the patient's presentation prior medical history, review of his EKG today with A-fib RVR and left bundle branch block with mild chest tightness I feel clinically this does not represent STEMI but I am definitely concerned the patient may be having evidence of NSTEMI or more likely demand ischemia in the setting of congestive heart failure exacerbation based on his medication noncompliance/running out of medications recently and also increased volume and edema and he does report feeling swollen. ? ?RADIOLOGY ?DG Chest 1 View ? ?Result Date: 11/02/2021 ?CLINICAL DATA:  Shortness of breath EXAM: CHEST  1 VIEW COMPARISON:  09/05/2021 FINDINGS: Multifocal opacity predominantly within the right lung, slightly worsened. Mild cardiomegaly. No pleural effusion. IMPRESSION: Multifocal opacity predominantly within the right lung, concerning for pneumonia. Electronically Signed   By: Ulyses Jarred M.D.   On: 11/02/2021 02:54   ? ?Chest x-ray reviewed interpreted by me as opacification noted in both lungs right greater than left.  Findings potentially of pulmonary edema versus pneumonia or other etiology considered. ? ?Radiologist report of chest x-ray also reviewed ? ? ? ? ?PROCEDURES: ? ?Critical Care performed: Yes, see critical care procedure note(s) ? ?Procedures ? ? ?MEDICATIONS ORDERED IN ED: ?Medications  ?rivaroxaban (XARELTO) tablet 20 mg (20 mg Oral Given 11/02/21 0548)  ?amiodarone (NEXTERONE) 1.8 mg/mL load via infusion 150 mg (150 mg Intravenous Bolus from Bag 11/02/21 0349)  ?  Followed by  ?amiodarone (NEXTERONE PREMIX) 360-4.14 MG/200ML-% (1.8 mg/mL) IV infusion (60 mg/hr Intravenous IV Pump [employer] 11/02/21 0600)  ?  Followed by  ?amiodarone (NEXTERONE PREMIX)  360-4.14 MG/200ML-% (1.8 mg/mL) IV infusion (has no administration in time range)  ?potassium chloride 10 mEq in 100 mL IVPB (has no administration in time range)  ?nitroGLYCERIN (NITROGLYN) 2 % ointment 1 inch (has no administration in time range)  ?furosemide (LASIX) injection 40 mg (has no administration in time range)  ?acetaminophen (TYLENOL) tablet 650 mg (has no administration in time range)  ?  Or  ?acetaminophen (TYLENOL) suppository 650 mg (has no administration in time range)  ?ondansetron (ZOFRAN) tablet 4 mg (has no administration in time range)  ?  Or  ?ondansetron (ZOFRAN) injection 4 mg (has no administration in  time range)  ?diltiazem (CARDIZEM) 1 mg/mL load via infusion 10 mg (10 mg Intravenous Bolus from Bag 11/02/21 0241)  ?potassium chloride 10 mEq in 100 mL IVPB (0 mEq Intravenous Stopped 11/02/21 0504)  ?furosemide (LASIX) injection 40 mg (40 mg Intravenous Given 11/02/21 0320)  ? ?CRITICAL CARE ?Performed by: Delman Kitten ? ? ?Total critical care time: 35 minutes ? ?Critical care time was exclusive of separately billable procedures and treating other patients. ? ?Critical care was necessary to treat or prevent imminent or life-threatening deterioration. ? ?Critical care was time spent personally by me on the following activities: development of treatment plan with patient and/or surrogate as well as nursing, discussions with consultants, evaluation of patient's response to treatment, examination of patient, obtaining history from patient or surrogate, ordering and performing treatments and interventions, ordering and review of laboratory studies, ordering and review of radiographic studies, pulse oximetry and re-evaluation of patient's condition. ? ? ?IMPRESSION / MDM / ASSESSMENT AND PLAN / ED COURSE  ?I reviewed the triage vital signs and the nursing notes. ?             ?               ? ?Differential diagnosis includes, but is not limited to, acute CHF, acute arrhythmia, atrial fibrillation  RVR, ACS, unlikely represent pulmonary embolism given he reports compliance with his anticoagulant, less likely pneumonia, no noted infectious symptoms normal white count normal procalcitonin.  Afebrile.  Differe

## 2021-11-02 NOTE — Assessment & Plan Note (Addendum)
2/2 medication non-compliance ?--No evidence of renal artery stenosis on renal duplex 2020 ?Plan: ?--cont home hydralazine 100 mg 3 times daily, and Imdur 120 mg daily ?-- Losartan and spironolactone are on hold due to AKI on CKD ?-- Carvedilol currently on hold to allow washout of cocaine ?--cont amlodipine 10 mg (increased dose) ?

## 2021-11-02 NOTE — Progress Notes (Signed)
ANTICOAGULATION CONSULT NOTE - Initial Consult ? ?Pharmacy Consult for Xarelto  ?Indication: atrial fibrillation ? ?No Known Allergies ? ?Patient Measurements: ?Height: 5\' 11"  (180.3 cm) ?Weight: 131.5 kg (290 lb) ?IBW/kg (Calculated) : 75.3 ?Heparin Dosing Weight:  ? ?Vital Signs: ?Temp: 97.3 ?F (36.3 ?C) (03/18 0214) ?Temp Source: Oral (03/18 0214) ?BP: 181/130 (03/18 0252) ?Pulse Rate: 92 (03/18 0252) ? ?Labs: ?Recent Labs  ?  11/02/21 ?0229 11/02/21 ?0239  ?HGB 11.4*  --   ?HCT 38.0*  --   ?PLT 230  --   ?APTT  --  60*  ?LABPROT  --  21.8*  ?INR  --  1.9*  ?CREATININE 1.85*  --   ?TROPONINIHS 1,276*  --   ? ? ?Estimated Creatinine Clearance: 73.4 mL/min (A) (by C-G formula based on SCr of 1.85 mg/dL (H)). ? ? ?Medical History: ?Past Medical History:  ?Diagnosis Date  ? Atrial flutter, paroxysmal ([place])   ? Chronic combined systolic and diastolic CHF (congestive heart failure) ([place])   ? Chronic kidney disease, stage III (moderate) ([name])   ? Dysrhythmia   ? brief episode afib, cardioverted did not return  ? Hypertension   ? Polysubstance abuse ([place])   ? ? ?Medications:  ?(Not in a hospital admission)  ? ?Assessment: ?Pharmacy consulted to dose xarelto in this 41 year old male with AFib.  ?Pt was on xarelto 20 mg PO daily at home,  last dose over 24 hrs ago. ? ?CrCl (TBW) = 98.7 ml/min  ? ?Goal of Therapy:  ?Prevention of thromboembolism ? ?  ?Plan:  ?Xarelto 20 mg PO QSupper ordered to start on 3/18 @ 0300.  ? ?Aasha Dina D ?11/02/2021,3:24 AM ? ? ?

## 2021-11-02 NOTE — Assessment & Plan Note (Addendum)
--  required multiple boluses of amiodarone ?--converted to NSR last night ?Plan: ?-Continue Xarelto 20 mg daily? ?-continue IV amio drip for now while cocaine washing out of system then restart Carvedilol, per cardio ? ?

## 2021-11-03 DIAGNOSIS — N1831 Chronic kidney disease, stage 3a: Secondary | ICD-10-CM

## 2021-11-03 LAB — BASIC METABOLIC PANEL
Anion gap: 10 (ref 5–15)
BUN: 26 mg/dL — ABNORMAL HIGH (ref 6–20)
CO2: 22 mmol/L (ref 22–32)
Calcium: 8.5 mg/dL — ABNORMAL LOW (ref 8.9–10.3)
Chloride: 108 mmol/L (ref 98–111)
Creatinine, Ser: 1.97 mg/dL — ABNORMAL HIGH (ref 0.61–1.24)
GFR, Estimated: 43 mL/min — ABNORMAL LOW (ref >60)
Glucose, Bld: 102 mg/dL — ABNORMAL HIGH (ref 70–99)
Potassium: 3.2 mmol/L — ABNORMAL LOW (ref 3.5–5.1)
Sodium: 140 mmol/L (ref 135–145)

## 2021-11-03 LAB — CBC
HCT: 35 % — ABNORMAL LOW (ref 39.0–52.0)
Hemoglobin: 10.4 g/dL — ABNORMAL LOW (ref 13.0–17.0)
MCH: 21.3 pg — ABNORMAL LOW (ref 26.0–34.0)
MCHC: 29.7 g/dL — ABNORMAL LOW (ref 30.0–36.0)
MCV: 71.7 fL — ABNORMAL LOW (ref 80.0–100.0)
Platelets: 180 10*3/uL (ref 150–400)
RBC: 4.88 MIL/uL (ref 4.22–5.81)
RDW: 18.7 % — ABNORMAL HIGH (ref 11.5–15.5)
WBC: 8.4 10*3/uL (ref 4.0–10.5)
nRBC: 0 % (ref 0.0–0.2)

## 2021-11-03 LAB — MAGNESIUM: Magnesium: 2.1 mg/dL (ref 1.7–2.4)

## 2021-11-03 MED ORDER — ACETAMINOPHEN 650 MG RE SUPP: 650.0000 mg | Freq: Four times a day (QID) | RECTAL | Status: DC | PRN

## 2021-11-03 MED ORDER — POTASSIUM CHLORIDE CRYS ER 20 MEQ PO TBCR
40.0000 meq | EXTENDED_RELEASE_TABLET | ORAL | Status: AC
  Administered 2021-11-03 (×2): 40 meq via ORAL
  Filled 2021-11-03 (×2): qty 2

## 2021-11-03 MED ORDER — AMLODIPINE BESYLATE 10 MG PO TABS
10.0000 mg | ORAL_TABLET | Freq: Every day | ORAL | Status: DC
  Administered 2021-11-04 – 2021-11-05 (×2): 10 mg via ORAL
  Filled 2021-11-03 (×2): qty 1

## 2021-11-03 MED ORDER — FUROSEMIDE 10 MG/ML IJ SOLN
40.0000 mg | Freq: Two times a day (BID) | INTRAMUSCULAR | Status: DC
  Administered 2021-11-03 – 2021-11-05 (×4): 40 mg via INTRAVENOUS
  Filled 2021-11-03 (×4): qty 4

## 2021-11-03 MED ORDER — POTASSIUM CHLORIDE CRYS ER 20 MEQ PO TBCR: 40.0000 meq | EXTENDED_RELEASE_TABLET | Freq: Once | ORAL | Status: DC

## 2021-11-03 MED ORDER — ACETAMINOPHEN 325 MG PO TABS
650.0000 mg | ORAL_TABLET | Freq: Four times a day (QID) | ORAL | Status: DC | PRN
  Administered 2021-11-03 – 2021-11-04 (×3): 650 mg via ORAL
  Filled 2021-11-03 (×2): qty 2

## 2021-11-03 MED ORDER — AMLODIPINE BESYLATE 5 MG PO TABS
5.0000 mg | ORAL_TABLET | Freq: Once | ORAL | Status: AC
  Administered 2021-11-03: 5 mg via ORAL
  Filled 2021-11-03: qty 1

## 2021-11-03 NOTE — Progress Notes (Signed)
Pt transferred to rm [id number] via bed with NT. Pt belongings with pt. Report given to receiving RN. ? ?

## 2021-11-03 NOTE — Progress Notes (Addendum)
? ?Progress Note ? ?Patient Name: Gerald [name] ?Date of Encounter: 11/03/2021 ? ?[facility] Cardiologist: Ida Rogue, MD  ? ?Subjective  ? ?BP remains elevated at 168/118 mmHg.  Converted to NSR. He denies any chest pain and shortness of breath is improved. ? ?Inpatient Medications  ?  ?Scheduled Meds: ? amLODipine  5 mg Oral Daily  ? Chlorhexidine Gluconate Cloth  6 each Topical Daily  ? empagliflozin  10 mg Oral Daily  ? furosemide  40 mg Intravenous Q12H  ? hydrALAZINE  100 mg Oral Q8H  ? isosorbide mononitrate  120 mg Oral Daily  ? potassium chloride  40 mEq Oral Q4H  ? QUEtiapine  50 mg Oral QHS  ? rivaroxaban  20 mg Oral Q supper  ? ?Continuous Infusions: ? amiodarone 30 mg/hr (11/03/21 1021)  ? ?PRN Meds: ?acetaminophen **OR** acetaminophen, ondansetron **OR** ondansetron (ZOFRAN) IV  ? ?Vital Signs  ?  ?Vitals:  ? 11/03/21 0700 11/03/21 0800 11/03/21 0900 11/03/21 1016  ?BP: (!) 149/105 (!) 149/92 (!) 159/110 (!) 168/118  ?Pulse: 92 93 96   ?Resp: (!) 28 (!) 30 (!) 28   ?Temp:      ?TempSrc:      ?SpO2: 92% 91% 94%   ?Weight:      ?Height:      ? ? ?Intake/Output Summary (Last 24 hours) at 11/03/2021 1053 ?Last data filed at 11/03/2021 0600 ?Gross per 24 hour  ?Intake 241.37 ml  ?Output 3475 ml  ?Net -3233.63 ml  ? ?Last 3 Weights 11/03/2021 11/02/2021 11/02/2021  ?Weight (lbs) 309 lb 1.4 oz 318 lb 5.5 oz 290 lb  ?Weight (kg) 140.2 kg 144.4 kg 131.543 kg  ?   ? ?Telemetry  ?  ?NSR with PACs- Personally Reviewed ? ?ECG  ?  ?No new EKG to review- Personally Reviewed ? ?Physical Exam  ? ?GEN: No acute distress.   ?Neck: No JVD ?Cardiac: RRR with occasional ectopy, no murmurs, rubs, or gallops.  ?Respiratory: Clear to auscultation bilaterally. ?GI: Soft, nontender, non-distended  ?MS: No edema; No deformity. ?Neuro:  Nonfocal  ?Psych: Normal affect  ? ?Labs  ?  ?High Sensitivity Troponin:   ?Recent Labs  ?Lab 11/02/21 ?0229 11/02/21 ?0354 11/02/21 ?1148  ?TROPONINIHS 1,276* 1,242* 1,135*  ?    ? ?Chemistry ?Recent Labs  ?Lab 11/02/21 ?0229 11/03/21 ?0433  ?NA 137 140  ?K 3.2* 3.2*  ?CL 106 108  ?CO2 23 22  ?GLUCOSE 122* 102*  ?BUN 26* 26*  ?CREATININE 1.85* 1.97*  ?CALCIUM 9.0 8.5*  ?GFRNONAA 47* 43*  ?ANIONGAP 8 10  ?  ? ?Hematology ?Recent Labs  ?Lab 11/02/21 ?0229 11/03/21 ?0433  ?WBC 8.2 8.4  ?RBC 5.33 4.88  ?HGB 11.4* 10.4*  ?HCT 38.0* 35.0*  ?MCV 71.3* 71.7*  ?MCH 21.4* 21.3*  ?MCHC 30.0 29.7*  ?RDW 19.2* 18.7*  ?PLT 230 180  ? ? ?BNP ?Recent Labs  ?Lab 11/02/21 ?0239  ?BNP 1,493.3*  ?  ? ?DDimer No results for input(s): DDIMER in the last 168 hours. ? ? ?CHA2DS2-VASc Score = 2  ?his indicates a 2.2% annual risk of stroke. ?The patient's score is based upon: ?CHF History: 1 ?HTN History: 1 ?Diabetes History: 0 ?Stroke History: 0 ?Vascular Disease History: 0 ?Age Score: 0 ?Gender Score: 0 ?  ?Radiology  ?  ?DG Chest 1 View ? ?Result Date: 11/02/2021 ?CLINICAL DATA:  Shortness of breath EXAM: CHEST  1 VIEW COMPARISON:  09/05/2021 FINDINGS: Multifocal opacity predominantly within the right lung, slightly  worsened. Mild cardiomegaly. No pleural effusion. IMPRESSION: Multifocal opacity predominantly within the right lung, concerning for pneumonia. Electronically Signed   By: Gerald [name] M.D.   On: 11/02/2021 02:54   ? ?Cardiac Studies  ? ?2D echo ?IMPRESSIONS  ? ? 1. Severe global reduction in LV function; severe LVH; suggest cardiac  ?MRI to R/O infiltrative cardiomyopathy or HCM.  ? 2. Left ventricular ejection fraction, by estimation, is 20 to 25%. The  ?left ventricle has severely decreased function. The left ventricle  ?demonstrates global hypokinesis. The left ventricular internal cavity size  ?was moderately dilated. There is severe  ? left ventricular hypertrophy. Left ventricular diastolic parameters are  ?consistent with Grade III diastolic dysfunction (restrictive).  ? 3. Right ventricular systolic function is normal. The right ventricular  ?size is normal. There is mildly elevated pulmonary  artery systolic  ?pressure.  ? 4. Left atrial size was severely dilated.  ? 5. A small pericardial effusion is present.  ? 6. The mitral valve is normal in structure. Trivial mitral valve  ?regurgitation. No evidence of mitral stenosis.  ? 7. The aortic valve is tricuspid. Aortic valve regurgitation is not  ?visualized. No aortic stenosis is present.  ? 8. Aortic dilatation noted. There is mild dilatation of the aortic root,  ?measuring 40 mm. There is mild dilatation of the ascending aorta,  ?measuring 40 mm.  ? 9. The inferior vena cava is normal in size with greater than 50%  ?respiratory variability, suggesting right atrial pressure of 3 mmHg.  ? ?Patient Profile  ?   ?41 y.o. 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 ? ?Assessment & Plan  ?  ?Acute on chronic combined CHF/nonischemic cardiomyopathy ?-Cardiac catheterization 12/2020 showed no evidence of CAD ?-Echocardiogram 01/2021 with LV function of 30 to 35% and intermediate diastolic parameter.  Severe LVH.  Normal RV function. ?-cMRI was recommended to rule out infiltrative disease especially in the setting of CKD but this has not been done ?-Patient with history of multiple CHF exacerbation in setting of medication noncompliance and ongoing polysubstance abuse especially with cocaine ?-Please forward this admission is the last 2 where he ran out of medications 4 days ago except for Xarelto and then did cocaine and marijuana and now admitted with rapid A-fib and acute on chronic CHF ?-He really has no evidence of lower extremity edema and chest x-ray was more consistent with acute pneumonia. ?-BNP elevated at 1493 ?-He was started on  IV Lasix 40 mg twice daily ?-He put out 3.475 L yesterday and is net -2.895 L since admission ?-Serum creatinine slightly bumped from 1.85>>>1.97 but was 2.04 on admission and baseline appears to be 1.9-2.0 ?-Not ACE/ARB/Entresto due to CKD>>> Home dose of losartan is on hold ?-Continue Lasix 40 mg IV twice daily and follow renal function closely ?-Continue hydralazine 100 mg 3 times daily, Imdur 120 mg daily and Jardiance 10 mg daily ?-Carvedilol currently on hold for now until Cocaine washes out and then restart Carvedilol (only beta blocker that I would recommend due to alpha blockade as well if he does further cocaine) ?-Increase amlodipine to 10 mg daily for better blood pressure control ?-Home dose of Spiro on hold due to AKI  on CKD ?-Strict INO, daily weight and daily renal function check ?  ?2. Elevated troponin/ Chest pain  ?-Troponin 1276>> 1242>> 1135 ?-Given that he had normal coronary arteries a year ago this is consistent with demand ischemia in the setting of volume overload and hypertensive urgency as well as recent cocaine use ?-No further ischemic work-up at this time ?  ?3.  Hypertensive urgency ?-- BP persistently remains elevated at 168/118 mmHg ?-- He was restarted on his home medications yesterday including hydralazine 100 mg 3 times daily, Amlodipine 5mg  daily and Imdur 120 mg daily ?-- Losartan and spironolactone are on hold due to AKI on CKD ?-- Carvedilol  currently on hold to allow washout of cocaine ?--No evidence of renal artery stenosis on renal duplex 2020 ?--Increase amlodipine to 10 mg daily for better blood pressure control ?  ?4.  Atrial flutter/atrial fibrillation with RV ?-Converted to NSR with frequent PACs on tele on IV Amio ?-He tells me he did not run out of Xarelto and his last dose was the day prior to admission ?-Continue Xarelto 20 mg daily  ?-continue IV amio drip for now while cocaine washing out of system then restart Carvedilol ?  ?5.  Hypokalemia ?-Potassium again  3.2 this morning magnesium 2.1 ?-Replete potassium to keep greater than 4 ?-Check bmet in a.m. ?  ?   ?HEAR Score (for undifferentiated chest pain):    ?  ?  ?New York Heart Association (NYHA) Functional Class ?N

## 2021-11-03 NOTE — Assessment & Plan Note (Signed)
--  monitor and replete PRN with oral potassium ?

## 2021-11-03 NOTE — Progress Notes (Signed)
?  Progress Note ? ? ?Patient: Gerald [name] WIO:[id number] DOB: [birth year] DOA: 11/02/2021     1 ?DOS: the patient was seen and examined on 11/03/2021 ?  ?Brief hospital course: ?Gerald [name] is a 41 y.o. male with medical history significant for nonischemic cardiomyopathy, EF of 20%, CKD stage IIIa baseline creatinine 1.8, history of A-fib flutter, presents to the ER today with elevated blood pressure, chest pain, near syncope and shortness of breath.  Patient states he ran out of all his medication except for Xarelto and hydrochlorothiazide ? ? ?Assessment and Plan: ?* Acute on chronic combined systolic and diastolic CHF (congestive heart failure) ([place]) ?--LVEF 20-25% in Jan 2023 Echo, with [name] DD. ?-Patient with history of multiple CHF exacerbation in setting of medication noncompliance and ongoing polysubstance abuse especially with cocaine. ?--CXR showed Multifocal opacity predominantly within the right lung.  Given no signs of infection, neg procal and BNP elevated at 1493, more likely pulm edema than PNA. ?Plan: ?--cont IV lasix 40 mg BID ?--cont Jardiance 10 mg daily ?--Carvedilol currently on hold for now until Cocaine washes out and then restart Carvedilol (only beta blocker that cardio would recommend due to alpha blockade as well if he does further cocaine) ?--Home dose of Spiro on hold due to AKI on CKD ? ?Atrial fibrillation with rapid ventricular response ([place]) ?--required multiple boluses of amiodarone ?--converted to NSR last night ?Plan: ?-Continue Xarelto 20 mg daily  ?-continue IV amio drip for now while cocaine washing out of system then restart Carvedilol, per cardio ? ? ?Hypertensive emergency ?2/2 medication non-compliance ?--No evidence of renal artery stenosis on renal duplex 2020 ?Plan: ?--cont home hydralazine 100 mg 3 times daily, and Imdur 120 mg daily ?-- Losartan and spironolactone are on hold due to AKI on CKD ?-- Carvedilol  currently on hold to allow washout of  cocaine ?--Increase amlodipine to 10 mg daily for better blood pressure control ? ?Elevated troponin ?2/2 demand ischemia ? ?Acute respiratory distress ?Secondary to CHf exacerbation.  Needed BiPAP initially for work of breathing.  No documented hypoxia.  Dyspnea improved with diuresis. ? ?Obesity, Class III, BMI 40-49.9 (morbid obesity) ([name]) ?Chronic. ? ?Cocaine abuse ([name]) ?--UDS pos for cocaine and cannabinoid ? ?Stage 3a chronic kidney disease (CKD) ([name]) - baseline SCr 1.8-2.0 ?Chronic. ? ?Hypokalemia ?--monitor and replete PRN with oral potassium ? ? ? ? ?  ? ?Subjective:  ?Pt reported dyspnea improved.  Having good urine output with diuresis. ? ?Physical Exam: ? ?Constitutional: NAD, AAOx3 ?HEENT: conjunctivae and lids normal, EOMI ?CV: No cyanosis.   ?RESP: normal respiratory effort, on RA ?SKIN: warm, dry ?Neuro: [name] - XII grossly intact.   ?Psych: Normal mood and affect.  Appropriate judgement and reason ? ? ?Data Reviewed: ? ?Family Communication:  ? ?Disposition: ?Status is: Inpatient ?Remains inpatient appropriate because: IV lasix ? ? Planned Discharge Destination: Home ? ? ? ?Time spent: 50 minutes ? ?Author: ?Enzo Bi, MD ?11/03/2021 12:54 PM ? ?For on call review [email or web address].  ?

## 2021-11-03 NOTE — Progress Notes (Addendum)
Pt complaining of a throbbing H/A 10/10. BP elevated at 196/117 HR 97. Tylenol  PRN was administered at 1628. NP Foust made aware. Will continue to monitor. ? ?Update 2150: NP Foust place order. Will continue to monitor. ? ?Update 0028: Pt BP at 151/130 HR 103 Left arm and 140/119 HR 103 on Right arm but no PRN order. NP Foust made aware but no new order place. Will continue to monitor. ?

## 2021-11-03 NOTE — Hospital Course (Signed)
Gerald [name] is a 41 y.o. male with medical history significant for nonischemic cardiomyopathy, EF of 20%, CKD stage IIIa baseline creatinine 1.8, history of A-fib flutter, presents to the ER today with elevated blood pressure, chest pain, near syncope and shortness of breath.  Patient states he ran out of all his medication except for Xarelto and hydrochlorothiazide ? ? ?

## 2021-11-04 LAB — CBC
HCT: 36.5 % — ABNORMAL LOW (ref 39.0–52.0)
Hemoglobin: 11.2 g/dL — ABNORMAL LOW (ref 13.0–17.0)
MCH: 21.3 pg — ABNORMAL LOW (ref 26.0–34.0)
MCHC: 30.7 g/dL (ref 30.0–36.0)
MCV: 69.4 fL — ABNORMAL LOW (ref 80.0–100.0)
Platelets: 244 10*3/uL (ref 150–400)
RBC: 5.26 MIL/uL (ref 4.22–5.81)
RDW: 18.3 % — ABNORMAL HIGH (ref 11.5–15.5)
WBC: 6.1 10*3/uL (ref 4.0–10.5)
nRBC: 0 % (ref 0.0–0.2)

## 2021-11-04 LAB — BASIC METABOLIC PANEL
Anion gap: 8 (ref 5–15)
BUN: 29 mg/dL — ABNORMAL HIGH (ref 6–20)
CO2: 25 mmol/L (ref 22–32)
Calcium: 8.8 mg/dL — ABNORMAL LOW (ref 8.9–10.3)
Chloride: 105 mmol/L (ref 98–111)
Creatinine, Ser: 2.03 mg/dL — ABNORMAL HIGH (ref 0.61–1.24)
GFR, Estimated: 42 mL/min — ABNORMAL LOW (ref >60)
Glucose, Bld: 118 mg/dL — ABNORMAL HIGH (ref 70–99)
Potassium: 3.1 mmol/L — ABNORMAL LOW (ref 3.5–5.1)
Sodium: 138 mmol/L (ref 135–145)

## 2021-11-04 LAB — GLUCOSE, CAPILLARY: Glucose-Capillary: 116 mg/dL — ABNORMAL HIGH (ref 70–99)

## 2021-11-04 LAB — MAGNESIUM: Magnesium: 2.3 mg/dL (ref 1.7–2.4)

## 2021-11-04 MED ORDER — CARVEDILOL 12.5 MG PO TABS
12.5000 mg | ORAL_TABLET | Freq: Two times a day (BID) | ORAL | Status: DC
  Administered 2021-11-04 – 2021-11-05 (×2): 12.5 mg via ORAL
  Filled 2021-11-04 (×2): qty 1

## 2021-11-04 MED ORDER — POTASSIUM CHLORIDE CRYS ER 20 MEQ PO TBCR
40.0000 meq | EXTENDED_RELEASE_TABLET | ORAL | Status: AC
  Administered 2021-11-04 (×2): 40 meq via ORAL
  Filled 2021-11-04 (×2): qty 2

## 2021-11-04 MED ORDER — BUTALBITAL-APAP-CAFFEINE 50-325-40 MG PO TABS
2.0000 | ORAL_TABLET | Freq: Once | ORAL | Status: AC
Start: 2021-11-04 — End: 2021-11-04
  Administered 2021-11-04: 2 via ORAL
  Filled 2021-11-04: qty 2

## 2021-11-04 NOTE — Progress Notes (Signed)
? ?Progress Note ? ?Patient Name: Gerald [name] ?Date of Encounter: 11/04/2021 ? ?[facility] Cardiologist: Ida Rogue, MD  ? ?Subjective  ? ?Good UOP. Kidney function stalbe. Bps still up. Patient reports he is feeling better, breathing is better. He is on NSR.  ? ?Inpatient Medications  ?  ?Scheduled Meds: ? amLODipine  10 mg Oral Daily  ? empagliflozin  10 mg Oral Daily  ? furosemide  40 mg Intravenous BID  ? hydrALAZINE  100 mg Oral Q8H  ? isosorbide mononitrate  120 mg Oral Daily  ? QUEtiapine  50 mg Oral QHS  ? rivaroxaban  20 mg Oral Q supper  ? ?Continuous Infusions: ? amiodarone 30 mg/hr (11/03/21 2212)  ? ?PRN Meds: ?acetaminophen **OR** acetaminophen, ondansetron **OR** ondansetron (ZOFRAN) IV  ? ?Vital Signs  ?  ?Vitals:  ? 11/04/21 0025 11/04/21 0100 11/04/21 3833 11/04/21 0739  ?BP: (!) 140/119  (!) 156/135 (!) 152/100  ?Pulse:   98 98  ?Resp:   20 18  ?Temp:   98.1 ?F (36.7 ?C) 98.2 ?F (36.8 ?C)  ?TempSrc:   Oral Oral  ?SpO2:   99% 98%  ?Weight:  129.3 kg    ?Height:      ? ? ?Intake/Output Summary (Last 24 hours) at 11/04/2021 1103 ?Last data filed at 11/04/2021 1000 ?Gross per 24 hour  ?Intake 999.45 ml  ?Output 2350 ml  ?Net -1350.55 ml  ? ?Last 3 Weights 11/04/2021 11/03/2021 11/03/2021  ?Weight (lbs) 285 lb 0.9 oz 285 lb 309 lb 1.4 oz  ?Weight (kg) 129.3 kg 129.275 kg 140.2 kg  ?   ? ?Telemetry  ?  ?NSR, HR 90-100, PACs. PVCs - Personally Reviewed ? ?ECG  ?  ?NO new - Personally Reviewed ? ?Physical Exam  ? ?GEN: No acute distress.   ?Neck: No JVD ?Cardiac: RRR, no murmurs, rubs, or gallops.  ?Respiratory: diffusely diminished. ?GI: Soft, nontender, non-distended  ?MS: No edema; No deformity. ?Neuro:  Nonfocal  ?Psych: Normal affect  ? ?Labs  ?  ?High Sensitivity Troponin:   ?Recent Labs  ?Lab 11/02/21 ?0229 11/02/21 ?0354 11/02/21 ?1148  ?TROPONINIHS 1,276* 1,242* 1,135*  ?   ?Chemistry ?Recent Labs  ?Lab 11/02/21 ?0229 11/03/21 ?0433 11/04/21 ?3832  ?NA 137 140 138  ?K 3.2* 3.2* 3.1*  ?CL  106 108 105  ?CO2 23 22 25   ?GLUCOSE 122* 102* 118*  ?BUN 26* 26* 29*  ?CREATININE 1.85* 1.97* 2.03*  ?CALCIUM 9.0 8.5* 8.8*  ?MG 2.3 2.1 2.3  ?GFRNONAA 47* 43* 42*  ?ANIONGAP 8 10 8   ?  ?Lipids No results for input(s): CHOL, TRIG, HDL, LABVLDL, LDLCALC, CHOLHDL in the last 168 hours.  ?Hematology ?Recent Labs  ?Lab 11/02/21 ?0229 11/03/21 ?0433 11/04/21 ?9191  ?WBC 8.2 8.4 6.1  ?RBC 5.33 4.88 5.26  ?HGB 11.4* 10.4* 11.2*  ?HCT 38.0* 35.0* 36.5*  ?MCV 71.3* 71.7* 69.4*  ?MCH 21.4* 21.3* 21.3*  ?MCHC 30.0 29.7* 30.7  ?RDW 19.2* 18.7* 18.3*  ?PLT 230 180 244  ? ?Thyroid  ?Recent Labs  ?Lab 11/02/21 ?0354  ?TSH 1.864  ?  ?BNP ?Recent Labs  ?Lab 11/02/21 ?0239  ?BNP 1,493.3*  ?  ?DDimer No results for input(s): DDIMER in the last 168 hours.  ? ?Radiology  ?  ?No results found. ? ?Cardiac Studies  ? ?2D echo ?IMPRESSIONS  ? ? 1. Severe global reduction in LV function; severe LVH; suggest cardiac  ?MRI to R/O infiltrative cardiomyopathy or HCM.  ? 2. Left ventricular  ejection fraction, by estimation, is 20 to 25%. The  ?left ventricle has severely decreased function. The left ventricle  ?demonstrates global hypokinesis. The left ventricular internal cavity size  ?was moderately dilated. There is severe  ? left ventricular hypertrophy. Left ventricular diastolic parameters are  ?consistent with Grade III diastolic dysfunction (restrictive).  ? 3. Right ventricular systolic function is normal. The right ventricular  ?size is normal. There is mildly elevated pulmonary artery systolic  ?pressure.  ? 4. Left atrial size was severely dilated.  ? 5. A small pericardial effusion is present.  ? 6. The mitral valve is normal in structure. Trivial mitral valve  ?regurgitation. No evidence of mitral stenosis.  ? 7. The aortic valve is tricuspid. Aortic valve regurgitation is not  ?visualized. No aortic stenosis is present.  ? 8. Aortic dilatation noted. There is mild dilatation of the aortic root,  ?measuring 40 mm. There is mild  dilatation of the ascending aorta,  ?measuring 40 mm.  ? 9. The inferior vena cava is normal in size with greater than 50%  ?respiratory variability, suggesting right atrial pressure of 3 mmHg.  ?  ? ?Patient Profile  ?   ?41 y.o. 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 ? ?Assessment & Plan  ?  ?Acute on chronic combined CHF/NICM ?- cardiac cath 12/2020 with no evidence of CAD ?- Echo 01/2021 with LVEF 30-35% and intermediate diastolic parameter, severe LVH, normal RV function ?- cMRI Was recommended to r/o infiltrative disease but this has not been done ?- Echo 09/05/21 with LVEF 20-25%, severe LVH, G3DD, small pericardial effusion, mild aortic dilation ?- h/o multiple CHF exacerbations in the setting of medication noncompliance and ongoing poly substance use, esp cocaine ?- patient ran out of meds 5 days ago except for Xarelto. Admitted with rapid afib ?- BNP 1493 and CXR with possible PNA ?- IV lasix 40mg  BID ?- PTA losartan and spiro held for AKI ?- PTA torsemide 20mg  daily ?- continue hydralazine, Imdur, Jardiance ?- Coreg held for cocaine washout>>restart when able ?- Net -4.9L ?- creatinine fairly stable ?- does not appear significant volume up on exam, although difficult to tell given body habitus ? ?Elevated troponin ?- HS trop elevated to 1276 ?- normal cors by cath a year ago ?- suspect demand ischemia ?- no further ischemic work-up ? ?Hypertensive urgency ?- amlodipine 10mg  daily ?- Jardiance 10mg  daily ?- hydralazine 100mg  TID ?- Imdur 120mg  daily ?- IV lasix ?- PTA coreg, losartan, and spiro held  ? ?Afib RVR ?- converted to NSR with IV amiodarone ?- reports compliance with Xarelto ?- IV amiodarone ?-  supplement K+ ?- can likely switch to oral amiodarone, although not the best long-term medication given young age. Restart BB as able.  ? ?For questions or updates, please contact [place] ?Please consult [email or web address] for contact info under  ? ?  ?   ?Signed, ?Carmon Brigandi Ninfa Meeker, PA-C  ?11/04/2021, 11:03 AM    ?

## 2021-11-04 NOTE — Consult Note (Addendum)
? ?  Heart Failure Nurse Navigator Note ? ?HfrEF 20-25%.  Grade 3 diastolic dysfunction.  Severe LVH.  Mildly elevated pulmonary artery systolic pressures. ? ?He presented to the emergency room with complaints of chest pains, near syncope and shortness of breath.  He states that he has been out of his medications for approximately 5 days except for the Xarelto and hydrochlorothiazide.  Blood pressure was 157/129 and BNP was elevated at 1493 ? ?Comorbidities: ? ?Noncompliance ?Atrial flutter ?Chronic kidney disease stage III ?Hypertension ?Polysubstance abuse including tobacco, cocaine and marijuana ? ?Medications: ? ?Amiodarone infusion ?Amlodipine 10 mg daily ?Jardiance 10 mg daily ?Furosemide 40 mg IV 2 times a day ?Hydralazine 100 mg every 8 hours ?Imdur 120 mg daily ?Potassium chloride 40 mEq every 4 hours ?Xarelto 20 mg daily with supper ? ?Home medications include atorvastatin 40 mg daily, carvedilol 12.5 mg 2 times a day, spironolactone 50 mg daily and torsemide 20 mg daily ? ?Labs: ? ?Sodium 138, potassium 3.1, chloride 105, CO2 25, BUN 29, creatinine 2.03, magnesium 2.3 ?Intake 1279 mL ?Output 2950 mL ?Weight 129.3 kg ? ? ? ?Initial meeting with patient today.  He is lying in bed complaining of a throbbing headache.  I offered to come back at another time when he was feeling better but he states would like to go ahead with the heart failure education. ? ?Discussed heart failure and what it means.Shown diagrams of normal heart and one with reduced EF. ? ?He was last hospitalized in January 2023 at [facility].  He did not follow-up with his appointments.  States that he was unable to get his medications because he did not follow-up with those appointments and did not turn in the application. ? ?He states that he has moved  here to [place] from [place] and is living with a 55 year old lady from his church.  She is the one who fixes the meals and he knows that she cooks with salt.  He realizes that salt  and higher's sodium foods are not good for him and tries to avoid as much as possible. He did admit to eating salty chips while watching a ball game days before admission. ? ? ?He does not monitor his fluid intake, explained the reasoning behind lower sodium and fluid restriction.  He voices understanding.  He had a large 32 ounce drink container which he had consumed yesterday.  Again reinforced no more than 64 ounces in a 24-hour period.  Also discussed ways to quench thirst. ? ?He also does not weigh himself on a daily basis, he states that he does have a scale.  Explained the reasoning behind daily weights and what to report. ? 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  He has no insurance. ? ?Stressed being compliant with his appointments and getting the application in to the medication management clinic to continue his medications to avoid further hospitalizations.  He missed 3 HV TOC clinic appointments that were scheduled in [place]. ? ?He was giving the living with heart failure teaching booklet, zone magnet and information on low-sodium foods. ? ? ?He was given an appointment on March 27 at 4 PM and the outpatient heart failure clinic.  He has a 33% no-show rating which is 17 out of 51 appointments. ?He had no further questions and we will continue to follow along. ? ?Pricilla Riffle RN [facility] ?

## 2021-11-04 NOTE — Progress Notes (Signed)
?  Progress Note ? ? ?Patient: Gerald [name] JHE:[id number] DOB: [birth year] DOA: 11/02/2021     2 ?DOS: the patient was seen and examined on 11/04/2021 ?  ?Brief hospital course: ?JT BRABEC II is a 41 y.o. male with medical history significant for nonischemic cardiomyopathy, EF of 20%, CKD stage IIIa baseline creatinine 1.8, history of A-fib flutter, presents to the ER today with elevated blood pressure, chest pain, near syncope and shortness of breath.  Patient states he ran out of all his medication except for Xarelto and hydrochlorothiazide ? ? ?Assessment and Plan: ?* Acute on chronic combined systolic and diastolic CHF (congestive heart failure) ([place]) ?--LVEF 20-25% in Jan 2023 Echo, with grade II DD. ?-Patient with history of multiple CHF exacerbation in setting of medication noncompliance and ongoing polysubstance abuse especially with cocaine. ?--CXR showed Multifocal opacity predominantly within the right lung.  Given no signs of infection, neg procal and BNP elevated at 1493, more likely pulm edema than PNA. ?Plan: ?--cont IV lasix 40 mg BID ?--cont Jardiance 10 mg daily ?--Carvedilol currently on hold for now until Cocaine washes out and then restart Carvedilol (only beta blocker that cardio would recommend due to alpha blockade as well if he does further cocaine) ?--Home dose of Spiro on hold due to AKI on CKD ? ?Atrial fibrillation with rapid ventricular response ([place]) ?--required multiple boluses of amiodarone ?--converted to NSR last night ?Plan: ?-Continue Xarelto 20 mg daily  ?-continue IV amio drip for now while cocaine washing out of system then restart Carvedilol, per cardio ? ? ?Hypertensive emergency ?2/2 medication non-compliance ?--No evidence of renal artery stenosis on renal duplex 2020 ?Plan: ?--cont home hydralazine 100 mg 3 times daily, and Imdur 120 mg daily ?-- Losartan and spironolactone are on hold due to AKI on CKD ?-- Carvedilol currently on hold to allow washout of  cocaine ?--cont amlodipine 10 mg (increased dose) ? ?Elevated troponin ?2/2 demand ischemia ? ?Acute respiratory distress ?Secondary to CHf exacerbation.  Needed BiPAP initially for work of breathing.  No documented hypoxia.  Dyspnea improved with diuresis. ? ?Obesity, Class III, BMI 40-49.9 (morbid obesity) ([place]) ?Chronic. ? ?Cocaine abuse ([place]) ?--UDS pos for cocaine and cannabinoid ? ?Stage 3a chronic kidney disease (CKD) ([name]) - baseline SCr 1.8-2.0 ?Chronic. ? ?Hypokalemia ?--monitor and replete PRN with oral potassium ? ? ? ? ?  ? ?Subjective:  ?HR better controlled.  Diuresing well. ? ?Physical Exam: ? ?Constitutional: NAD, AAOx3 ?HEENT: conjunctivae and lids normal, EOMI ?CV: No cyanosis.   ?RESP: normal respiratory effort, on RA ?Neuro: II - XII grossly intact.   ?Psych: Normal mood and affect.   ? ? ?Data Reviewed: ? ?Family Communication:  ? ?Disposition: ?Status is: Inpatient ?Remains inpatient appropriate because: IV lasix ? ? Planned Discharge Destination: Home ? ? ? ?Time spent: 35 minutes ? ?Author: ?Enzo Bi, MD ?11/04/2021 5:49 PM ? ?For on call review [email or web address].  ?

## 2021-11-04 NOTE — Plan of Care (Signed)

## 2021-11-05 ENCOUNTER — Telehealth: Payer: Self-pay | Admitting: Medical

## 2021-11-05 ENCOUNTER — Other Ambulatory Visit: Payer: Self-pay

## 2021-11-05 DIAGNOSIS — N179 Acute kidney failure, unspecified: Secondary | ICD-10-CM

## 2021-11-05 LAB — CBC
HCT: 38.5 % — ABNORMAL LOW (ref 39.0–52.0)
Hemoglobin: 11.7 g/dL — ABNORMAL LOW (ref 13.0–17.0)
MCH: 21.2 pg — ABNORMAL LOW (ref 26.0–34.0)
MCHC: 30.4 g/dL (ref 30.0–36.0)
MCV: 69.9 fL — ABNORMAL LOW (ref 80.0–100.0)
Platelets: 273 10*3/uL (ref 150–400)
RBC: 5.51 MIL/uL (ref 4.22–5.81)
RDW: 18.6 % — ABNORMAL HIGH (ref 11.5–15.5)
WBC: 5.1 10*3/uL (ref 4.0–10.5)
nRBC: 0 % (ref 0.0–0.2)

## 2021-11-05 LAB — BASIC METABOLIC PANEL
Anion gap: 9 (ref 5–15)
BUN: 31 mg/dL — ABNORMAL HIGH (ref 6–20)
CO2: 25 mmol/L (ref 22–32)
Calcium: 8.8 mg/dL — ABNORMAL LOW (ref 8.9–10.3)
Chloride: 105 mmol/L (ref 98–111)
Creatinine, Ser: 2.1 mg/dL — ABNORMAL HIGH (ref 0.61–1.24)
GFR, Estimated: 40 mL/min — ABNORMAL LOW (ref >60)
Glucose, Bld: 112 mg/dL — ABNORMAL HIGH (ref 70–99)
Potassium: 3.7 mmol/L (ref 3.5–5.1)
Sodium: 139 mmol/L (ref 135–145)

## 2021-11-05 LAB — MAGNESIUM: Magnesium: 2.2 mg/dL (ref 1.7–2.4)

## 2021-11-05 MED ORDER — TORSEMIDE 20 MG PO TABS
20.0000 mg | ORAL_TABLET | Freq: Every day | ORAL | 2 refills | Status: AC
Start: 2021-11-05 — End: 2022-02-03
  Filled 2021-11-05: qty 30, 30d supply, fill #0

## 2021-11-05 MED ORDER — LOSARTAN POTASSIUM 25 MG PO TABS
25.0000 mg | ORAL_TABLET | Freq: Every day | ORAL | 2 refills | Status: AC
Start: 2021-11-05 — End: 2022-02-03
  Filled 2021-11-05: qty 30, 30d supply, fill #0

## 2021-11-05 MED ORDER — DAPAGLIFLOZIN PROPANEDIOL 10 MG PO TABS
10.0000 mg | ORAL_TABLET | Freq: Every day | ORAL | 2 refills | Status: AC
  Filled 2021-11-05 – 2021-11-06 (×2): qty 30, 30d supply, fill #0

## 2021-11-05 MED ORDER — TORSEMIDE 20 MG PO TABS
20.0000 mg | ORAL_TABLET | Freq: Every day | ORAL | Status: DC
  Administered 2021-11-05: 20 mg via ORAL
  Filled 2021-11-05: qty 1

## 2021-11-05 MED ORDER — LOSARTAN POTASSIUM 25 MG PO TABS
25.0000 mg | ORAL_TABLET | Freq: Every day | ORAL | Status: DC
  Administered 2021-11-05: 25 mg via ORAL
  Filled 2021-11-05: qty 1

## 2021-11-05 MED ORDER — EMPAGLIFLOZIN 10 MG PO TABS
10.0000 mg | ORAL_TABLET | Freq: Every day | ORAL | 2 refills | Status: DC
  Filled 2021-11-05: qty 30, 30d supply, fill #0

## 2021-11-05 MED ORDER — POTASSIUM CHLORIDE ER 10 MEQ PO TBCR
10.0000 meq | EXTENDED_RELEASE_TABLET | Freq: Every day | ORAL | 2 refills | Status: AC
  Filled 2021-11-05: qty 30, 30d supply, fill #0

## 2021-11-05 MED ORDER — CARVEDILOL 12.5 MG PO TABS
12.5000 mg | ORAL_TABLET | Freq: Two times a day (BID) | ORAL | 2 refills | Status: AC
  Filled 2021-11-05: qty 60, 30d supply, fill #0

## 2021-11-05 MED ORDER — HYDRALAZINE HCL 100 MG PO TABS
100.0000 mg | ORAL_TABLET | Freq: Three times a day (TID) | ORAL | 2 refills | Status: AC
  Filled 2021-11-05: qty 90, 30d supply, fill #0

## 2021-11-05 MED ORDER — ATORVASTATIN CALCIUM 40 MG PO TABS
40.0000 mg | ORAL_TABLET | Freq: Every day | ORAL | 2 refills | Status: AC
  Filled 2021-11-05: qty 30, 30d supply, fill #0

## 2021-11-05 MED ORDER — RIVAROXABAN 20 MG PO TABS
20.0000 mg | ORAL_TABLET | Freq: Every day | ORAL | 2 refills | Status: AC
  Filled 2021-11-05: qty 30, 30d supply, fill #0

## 2021-11-05 MED ORDER — AMLODIPINE BESYLATE 5 MG PO TABS
10.0000 mg | ORAL_TABLET | Freq: Every day | ORAL | 2 refills | Status: AC
  Filled 2021-11-05: qty 60, 30d supply, fill #0

## 2021-11-05 MED ORDER — ISOSORBIDE MONONITRATE ER 60 MG PO TB24
120.0000 mg | ORAL_TABLET | Freq: Every day | ORAL | 0 refills | Status: AC
  Filled 2021-11-05: qty 60, 30d supply, fill #0

## 2021-11-05 NOTE — Telephone Encounter (Signed)
Lab order for bmp placed. ?

## 2021-11-05 NOTE — Discharge Summary (Addendum)
? ?Physician Discharge Summary ? ? ?Gerald [name]  male DOB: 1981/04/24  ?ASN:[id number] ? ?PCP: Patient, [name] (Inactive) ? ?Admit date: 11/02/2021 ?Discharge date: 11/05/2021 ? ?Admitted From: home ?Disposition:  home ?CODE STATUS: Full code ? ?Hospital Course:  ?For full details, please see H&P, progress notes, consult notes and ancillary notes.  ?Briefly,  ?Gerald [name] is a 41 y.o. male with medical history significant for nonischemic cardiomyopathy, EF of 20%, CKD stage IIIa, history of A-fib flutter, presented to the ER today with elevated blood pressure, chest pain, near syncope and shortness of breath.  Patient states he ran out of all his medication except for Xarelto and hydrochlorothiazide ?  ?* Acute on chronic combined systolic and diastolic CHF (congestive heart failure) ([place]) ?--LVEF 20-25% in Jan 2023 Echo, with [name] DD. ?-Patient with history of multiple CHF exacerbation in setting of medication noncompliance and ongoing polysubstance abuse especially with cocaine. ?--CXR showed Multifocal opacity predominantly within the right lung.  Given no signs of infection, neg procal and BNP elevated at 1493, more likely pulm edema than PNA. ?--cardiology was consulted and directed pt's heart failure medication management. ?--Pt was diuresed with IV lasix 40 mg BID with good response, and discharged on torsemide 20 mg daily.  Home aldactone was not continued, per cardio rec. ?--cont Jardiance 10 mg daily ?--Carvedilol held until Cocaine washed out and resumed prior to discharge. (Coreg the only beta blocker that cardio would recommend due to alpha blockade as well if he does further cocaine) ?- continue amlodipine, hydralazine, Imdur, Jardiance (Jardiance was substituted with [facility] Glade since [facility] does not have Jardiance).  Home Losartan 25 mg daily resumed prior to discharge. ?  ?Atrial fibrillation with rapid ventricular response ([place]) ?--required multiple boluses of  amiodarone ?--converted to NSR.  Coreg resumed and amiodarone not continued. ?-Continue Xarelto 20 mg daily  ?  ?Hypertensive emergency ?2/2 medication non-compliance ?--No evidence of renal artery stenosis on renal duplex 2020 ?--cont amlodipine (increased dose), carvedilol, hydralazine, isosorbide mononitrate ?--home losartan resumed prior to discharge.   ?--Home aldactone was d/c'ed per cardio rec.  ?  ?Elevated troponin ?2/2 demand ischemia ?  ?Acute respiratory distress ?Secondary to CHf exacerbation.  Needed BiPAP initially for work of breathing.  No documented hypoxia.  Dyspnea improved with diuresis. ?  ?Obesity, Class III, BMI 40-49.9 (morbid obesity) ([place]) ?Chronic. ?  ?Cocaine abuse ([place]) ?--UDS pos for cocaine and cannabinoid ?  ?Stage 3a chronic kidney disease (CKD) ([name]) - baseline SCr 1.8-2.0 ?Chronic. ?AKI ruled out ?  ?Hypokalemia ?--monitored and repleted PRN with oral potassium ? ? ?Discharge Diagnoses:  ?Principal Problem: ?  Acute on chronic combined systolic and diastolic CHF (congestive heart failure) ([place]) ?Active Problems: ?  Atrial fibrillation with rapid ventricular response ([place]) ?  Acute respiratory distress ?  Elevated troponin ?  Hypertensive emergency ?  Stage 3a chronic kidney disease (CKD) ([name]) - baseline SCr 1.8-2.0 ?  Cocaine abuse ([place]) ?  Obesity, Class III, BMI 40-49.9 (morbid obesity) ([place]) ?  Hypokalemia ? ? ?30 Day Unplanned Readmission Risk Score   ? ?Flowsheet Row ED to Hosp-Admission (Current) from 11/02/2021 in [facility]  ?30 Day Unplanned Readmission Risk Score (%) 43.71 Filed at 11/05/2021 1200  ? ?  ? ? This score is the patient's risk of an unplanned readmission within 30 days of being discharged (0 -100%). The score is based on dignosis, age, lab data, medications, orders, and past utilization.   ?  Low:  0-14.9   Medium: 15-21.9   High: 22-29.9   Extreme: 30 and above ? ?  ? ?  ? ? ?Discharge Instructions: ? ?Allergies as of 11/05/2021   ?No  Known Allergies ?  ? ?  ?Medication List  ?  ? ?STOP taking these medications   ? ?Jardiance 10 MG Tabs tablet ?Generic drug: empagliflozin ?  ?potassium chloride 10 MEQ tablet ?Commonly known as: KLOR-CON M ?Replaced by: potassium chloride 10 MEQ tablet ?  ?spironolactone 50 MG tablet ?Commonly known as: ALDACTONE ?  ? ?  ? ?TAKE these medications   ? ?amLODipine 5 MG tablet ?Commonly known as: NORVASC ?Take 2 tablets (10 mg total) by mouth once daily. ?What changed: how much to take ?  ?atorvastatin 40 MG tablet ?Commonly known as: LIPITOR ?Take 1 tablet (40 mg total) by mouth once daily. ?  ?carvedilol 12.5 MG tablet ?Commonly known as: COREG ?Take 1 tablet (12.5 mg total) by mouth 2 (two) times daily with a meal. ?  ?dapagliflozin propanediol 10 MG Tabs tablet ?Commonly known as: [place] ?Take 1 tablet (10 mg total) by mouth daily before breakfast. ?  ?hydrALAZINE 100 MG tablet ?Commonly known as: APRESOLINE ?Take 1 tablet (100 mg total) by mouth 3 (three) times daily. ?What changed: when to take this ?  ?isosorbide mononitrate 60 MG 24 hr tablet ?Commonly known as: IMDUR ?Take 2 tablets (120 mg total) by mouth once daily. ?What changed: medication strength ?  ?losartan 25 MG tablet ?Commonly known as: COZAAR ?Take 1 tablet (25 mg total) by mouth once daily. ?What changed:  ?medication strength ?how much to take ?  ?potassium chloride 10 MEQ tablet ?Commonly known as: KLOR-CON ?Take 1 tablet (10 mEq total) by mouth once daily. ?Replaces: potassium chloride 10 MEQ tablet ?  ?torsemide 20 MG tablet ?Commonly known as: DEMADEX ?Take 1 tablet (20 mg total) by mouth once daily. ?  ?Xarelto 20 MG Tabs tablet ?Generic drug: rivaroxaban ?Take 1 tablet (20 mg total) by mouth once daily with supper. ?  ? ?  ? ? ? Follow-up Information   ? ? Minna Merritts, MD Follow up on 11/12/2021.   ?Specialty: Cardiology ?Why: @ 1:30pm for labs ?Contact information: ?[address] ?[place]  [address] ?[phone] ? ? ?  ?  ? ? Minna Merritts, MD Follow up on 12/09/2021.   ?Specialty: Cardiology ?Why: @ 2:20pm follow up ?Contact information: ?[address] ?[place] [address] ?[phone] ? ? ?  ?  ? ?  ?  ? ?  ? ? ?No Known Allergies ? ? ?The results of significant diagnostics from this hospitalization (including imaging, microbiology, ancillary and laboratory) are listed below for reference.  ? ?Consultations: ? ? ?Procedures/Studies: ?DG Chest 1 View ? ?Result Date: 11/02/2021 ?CLINICAL DATA:  Shortness of breath EXAM: CHEST  1 VIEW COMPARISON:  09/05/2021 FINDINGS: Multifocal opacity predominantly within the right lung, slightly worsened. Mild cardiomegaly. No pleural effusion. IMPRESSION: Multifocal opacity predominantly within the right lung, concerning for pneumonia. Electronically Signed   By: Ulyses Jarred M.D.   On: 11/02/2021 02:54   ? ? ? ?Labs: ?BNP (last 3 results) ?Recent Labs  ?  07/07/21 ?0431 09/05/21 ?0532 11/02/21 ?0239  ?BNP 1,323.8* 661.0* 1,493.3*  ? ?Basic Metabolic Panel: ?Recent Labs  ?Lab 11/02/21 ?0229 11/03/21 ?0433 11/04/21 ?2595 11/05/21 ?6387  ?NA 137 140 138 139  ?K 3.2* 3.2* 3.1* 3.7  ?CL 106 108 105 105  ?  CO2 23 22 25 25   ?GLUCOSE 122* 102* 118* 112*  ?BUN 26* 26* 29* 31*  ?CREATININE 1.85* 1.97* 2.03* 2.10*  ?CALCIUM 9.0 8.5* 8.8* 8.8*  ?MG 2.3 2.1 2.3 2.2  ? ?Liver Function Tests: ?No results for input(s): AST, ALT, ALKPHOS, BILITOT, PROT, ALBUMIN in the last 168 hours. ?No results for input(s): LIPASE, AMYLASE in the last 168 hours. ?No results for input(s): AMMONIA in the last 168 hours. ?CBC: ?Recent Labs  ?Lab 11/02/21 ?0229 11/03/21 ?0433 11/04/21 ?4982 11/05/21 ?6415  ?WBC 8.2 8.4 6.1 5.1  ?HGB 11.4* 10.4* 11.2* 11.7*  ?HCT 38.0* 35.0* 36.5* 38.5*  ?MCV 71.3* 71.7* 69.4* 69.9*  ?PLT 230 180 244 273  ? ?Cardiac Enzymes: ?No results for input(s): CKTOTAL, CKMB, CKMBINDEX, TROPONINI in the last 168 hours. ?BNP: ?Invalid input(s):  POCBNP ?CBG: ?Recent Labs  ?Lab 11/02/21 ?8309  ?GLUCAP 116*  ? ?D-Dimer ?No results for input(s): DDIMER in the last 72 hours. ?Hgb A1c ?No results for input(s): HGBA1C in the last 72 hours. ?Lipid Profile ?No results for input(s): CHOL, HDL, LDLCALC, T

## 2021-11-05 NOTE — Progress Notes (Addendum)
? ?  Heart Failure Nurse Navigator Note ? ?Met with patient today he was sitting on the edge of the bed in no acute distress. ? ?By teach back method went over daily weights, fluid restriction along with sodium.  Needed very little reinforcement. ? ?States that he is probably being discharged today, states he is ready to go home and take care of himself so he does not have to return. ? ?Nursing staff had not weighed him earlier this morning, patient stood on the scale and weighed 307.6 pounds or 139.8 kg.   He feels a good weight for him 295-300  pounds. ? ?Also discussed that the computer has  still living in [place].  He states that the [place]  address is incorrect. His address  in [place] is [place] him aware so that could be changed. ? ?Discussed getting his medications from medication management clinic.  Also make sure that he get the application filled out with the proper documentation. ? ?Reinforced follow-up in the outpatient heart failure clinic. ? ?Medications: ? ?Amlodipine 10 mg daily ?Carvedilol 12.5 mg 2 times a day with meals ?Jardiance 10 mg ?Hydralazine 100 mg every 8 hours ?Isosorbide mononitrate 120 mg daily ?Losartan 25 mg daily ?Xarelto 20 mg daily ?Torsemide 20 mg daily ? ?Labs: ? ?Sodium 139, potassium 3.7, chloride 105, CO2 25, BUN 31, creatinine 2.1 ?Blood pressure 151/87 ? ?Pricilla Riffle RN [facility] ?

## 2021-11-05 NOTE — Telephone Encounter (Signed)
Attempt to schedule un able to LVM, no answer ?

## 2021-11-05 NOTE — Progress Notes (Signed)
? ?Progress Note ? ?Patient Name: Gerald [name] ?Date of Encounter: 11/05/2021 ? ?[facility] Cardiologist: Ida Rogue, MD  ? ?Subjective  ? ?Patient is overall feeling better. Bps still mildly elevated. Remains in SR.  ? ?Inpatient Medications  ?  ?Scheduled Meds: ? amLODipine  10 mg Oral Daily  ? carvedilol  12.5 mg Oral BID WC  ? empagliflozin  10 mg Oral Daily  ? furosemide  40 mg Intravenous BID  ? hydrALAZINE  100 mg Oral Q8H  ? isosorbide mononitrate  120 mg Oral Daily  ? QUEtiapine  50 mg Oral QHS  ? rivaroxaban  20 mg Oral Q supper  ? ?Continuous Infusions: ? amiodarone 30 mg/hr (11/04/21 2357)  ? ?PRN Meds: ?acetaminophen **OR** acetaminophen, ondansetron **OR** ondansetron (ZOFRAN) IV  ? ?Vital Signs  ?  ?Vitals:  ? 11/04/21 2050 11/04/21 2300 11/05/21 7829 11/05/21 0724  ?BP: (!) 144/99 (!) 148/106 (!) 145/106 (!) 144/100  ?Pulse: 95 85 (!) 106 96  ?Resp: 18 18 18 19   ?Temp: 98 ?F (36.7 ?C) 98.8 ?F (37.1 ?C) 98.6 ?F (37 ?C) 98.7 ?F (37.1 ?C)  ?TempSrc:    Oral  ?SpO2: 94% 92% 93% 94%  ?Weight:      ?Height:      ? ? ?Intake/Output Summary (Last 24 hours) at 11/05/2021 1013 ?Last data filed at 11/05/2021 0800 ?Gross per 24 hour  ?Intake 1720 ml  ?Output 2900 ml  ?Net -1180 ml  ? ?Last 3 Weights 11/04/2021 11/03/2021 11/03/2021  ?Weight (lbs) 285 lb 0.9 oz 285 lb 309 lb 1.4 oz  ?Weight (kg) 129.3 kg 129.275 kg 140.2 kg  ?   ? ?Telemetry  ?  ?NSR, PACs, PVCs, HR 90-100 - Personally Reviewed ? ?ECG  ?  ?No new - Personally Reviewed ? ?Physical Exam  ? ?GEN: No acute distress.   ?Neck: No JVD ?Cardiac: RRR, no murmurs, rubs, or gallops.  ?Respiratory: Clear to auscultation bilaterally. ?GI: Soft, nontender, non-distended  ?MS: No edema; No deformity. ?Neuro:  Nonfocal  ?Psych: Normal affect  ? ?Labs  ?  ?High Sensitivity Troponin:   ?Recent Labs  ?Lab 11/02/21 ?0229 11/02/21 ?0354 11/02/21 ?1148  ?TROPONINIHS 1,276* 1,242* 1,135*  ?   ?Chemistry ?Recent Labs  ?Lab 11/03/21 ?0433 11/04/21 ?5621  11/05/21 ?3086  ?NA 140 138 139  ?K 3.2* 3.1* 3.7  ?CL 108 105 105  ?CO2 22 25 25   ?GLUCOSE 102* 118* 112*  ?BUN 26* 29* 31*  ?CREATININE 1.97* 2.03* 2.10*  ?CALCIUM 8.5* 8.8* 8.8*  ?MG 2.1 2.3 2.2  ?GFRNONAA 43* 42* 40*  ?ANIONGAP 10 8 9   ?  ?Lipids No results for input(s): CHOL, TRIG, HDL, LABVLDL, LDLCALC, CHOLHDL in the last 168 hours.  ?Hematology ?Recent Labs  ?Lab 11/03/21 ?0433 11/04/21 ?5784 11/05/21 ?6962  ?WBC 8.4 6.1 5.1  ?RBC 4.88 5.26 5.51  ?HGB 10.4* 11.2* 11.7*  ?HCT 35.0* 36.5* 38.5*  ?MCV 71.7* 69.4* 69.9*  ?MCH 21.3* 21.3* 21.2*  ?MCHC 29.7* 30.7 30.4  ?RDW 18.7* 18.3* 18.6*  ?PLT 180 244 273  ? ?Thyroid  ?Recent Labs  ?Lab 11/02/21 ?0354  ?TSH 1.864  ?  ?BNP ?Recent Labs  ?Lab 11/02/21 ?0239  ?BNP 1,493.3*  ?  ?DDimer No results for input(s): DDIMER in the last 168 hours.  ? ?Radiology  ?  ?No results found. ? ?Cardiac Studies  ? ?2D echo ?IMPRESSIONS  ? ? 1. Severe global reduction in LV function; severe LVH; suggest cardiac  ?MRI to R/O  infiltrative cardiomyopathy or HCM.  ? 2. Left ventricular ejection fraction, by estimation, is 20 to 25%. The  ?left ventricle has severely decreased function. The left ventricle  ?demonstrates global hypokinesis. The left ventricular internal cavity size  ?was moderately dilated. There is severe  ? left ventricular hypertrophy. Left ventricular diastolic parameters are  ?consistent with Grade III diastolic dysfunction (restrictive).  ? 3. Right ventricular systolic function is normal. The right ventricular  ?size is normal. There is mildly elevated pulmonary artery systolic  ?pressure.  ? 4. Left atrial size was severely dilated.  ? 5. A small pericardial effusion is present.  ? 6. The mitral valve is normal in structure. Trivial mitral valve  ?regurgitation. No evidence of mitral stenosis.  ? 7. The aortic valve is tricuspid. Aortic valve regurgitation is not  ?visualized. No aortic stenosis is present.  ? 8. Aortic dilatation noted. There is mild dilatation  of the aortic root,  ?measuring 40 mm. There is mild dilatation of the ascending aorta,  ?measuring 40 mm.  ? 9. The inferior vena cava is normal in size with greater than 50%  ?respiratory variability, suggesting right atrial pressure of 3 mmHg. ? ?Patient Profile  ?   ?41 y.o. male  with a hx of chronic combined CHF secondary to nonischemic cardiomyopathy, no evidence of CAD by cath May 2022, paroxysmal atrial fibrillation on Xarelto, hypertension, morbid obesity, polysubstance abuse, CKD and noncompliance  who is being seen today for the evaluation of hypertensive urgency/acute CHF/atrial fibrillation with RVR. ? ?Assessment & Plan  ?  ?Acute on chronic combined CHF/NICM ?- cardiac cath 12/2020 with no evidence of CAD ?- Echo 01/2021 with LVEF 30-35% and intermediate diastolic parameter, severe LVH, normal RV function ?- cMRI Was recommended to r/o infiltrative disease but this has not been done ?- Echo 09/05/21 with LVEF 20-25%, severe LVH, G3DD, small pericardial effusion, mild aortic dilation ?- h/o multiple CHF exacerbations in the setting of medication noncompliance and ongoing poly substance use, esp cocaine ?- patient ran out of meds 5 days ago except for Xarelto. Admitted with rapid afib ?- BNP 1493  ?- IV lasix 40mg  BID ?- PTA losartan and spiro held for AKI ?- continue hydralazine, Imdur, Jardiance ?- Coreg 12.5mg  BID  ?- Net -6.1L ?- appears euvolemic on exam ?- change IV lasix to home torsemide 20mg  daily ?  ?Elevated troponin ?- HS trop elevated to 1276 ?- normal cors by cath a year ago ?- suspect demand ischemia ?- no further ischemic work-up ?  ?Hypertensive urgency ?- amlodipine 10mg  daily ?- Jardiance 10mg  daily ?- hydralazine 100mg  TID ?- Imdur 120mg  daily ?- Coreg 12.5mg  BID ?- bp mildly elevated ?- PTA losartan, and spiro held ?  ?Afib RVR ?- tele shows SR ?- reports compliance with Xarelto ?- supplement K+ ?- stop IV amiodarone ?- Coreg restarted  ? ?For questions or updates, please contact [place] ?Please consult [email or web address] for contact info under  ? ?  ?   ?Signed, ?Haylynn Pha Ninfa Meeker, PA-C  ?11/05/2021, 10:13 AM    ?

## 2021-11-05 NOTE — Telephone Encounter (Signed)
Furth, Cadence H, PA-C  P Cv Div Burl Triage; P Cv Div Burl Scheduling ?Pt needs BMET in a week for CKD/AKI and a hospital follow-up in 3 weeks.  ?

## 2021-11-05 NOTE — Progress Notes (Signed)
Patient to discharge home today, has moved from [place] to [place] and living with a friend per report.  ? ?Patient has a heart failure appointment lined up with Otila Kluver.  ? ?Patient to get medications from med management at time of discharge.  ? ?Patient hx of [facility] to [facility] for [facility]CP needs.  ? ?No further dc needs identified.  ? Pricilla Riffle, [place] ?[phone] ? ?

## 2021-11-06 ENCOUNTER — Other Ambulatory Visit: Payer: Self-pay

## 2021-11-07 LAB — CULTURE, BLOOD (ROUTINE X 2)
Culture: NO GROWTH
Culture: NO GROWTH
Special Requests: ADEQUATE

## 2021-11-08 NOTE — Telephone Encounter (Signed)
Message still in triage. ? ?I have reviewed his upcoming appointments. ? ?The patient is scheduled for: ?1) 11/12/21- 1:30 pm- BMP ?2) 12/09/21- 2:20 pm- Dr. Rockey Situ ?

## 2021-11-10 NOTE — Progress Notes (Deleted)
? Patient ID: Gerald [name], male    DOB: 1980-09-05, 41 y.o.   MRN: [id number] ? ?HPI ? ?Mr Abarca is a 41 y/o male with a history of HTN, atrial fibrillation, current tobacco use and chronic heart failure.  ? ?Echo report from 09/05/21 reviewed and showed an EF of 20-25% along with severe LVH and mildly elevated PA pressure. Echo report from 04/04/2019 reviewed and showed an EF of 30-35% along with mild/moderate MR and no thrombi or masses.  ? ?LHC done 12/21/20 showed: ?LV end diastolic pressure is moderately elevated. ?  ?1. Very large and normal coronary arteries ?2. Moderately elevated LVEDP 24 mm Hg ? ?Admitted 11/02/21 due to acute on chronic HF due to running out of medications. Initially given IV lasix with transition to oral diuretics. Cardiology consult obtained. + cocaine/marijuana. Given amiodarone due to AF. Elevated tropoinin thought to be due to demand ischemia. Weaned off of bipap. Hypokalemia corrected. Discharged after 3 days.  ? ?He presents today for a follow-up visit although hasn't been seen since September 2020. He presents with a chief complaint of   ? ?Past Medical History:  ?Diagnosis Date  ? Acute on chronic combined systolic and diastolic CHF (congestive heart failure) ([place])   ? Acute on chronic systolic CHF (congestive heart failure) ([place]) 07/05/2020  ? Acute systolic congestive heart failure ([place])   ? Atrial flutter, paroxysmal ([place])   ? CHF (congestive heart failure) ([place]) 03/30/2021  ? Chronic combined systolic and diastolic CHF (congestive heart failure) ([place])   ? Chronic kidney disease, stage III (moderate) ([name])   ? Dysrhythmia   ? brief episode afib, cardioverted did not return  ? Hypertension   ? Hypertensive crisis 01/20/2021  ? Hypertensive emergency 12/21/2020  ? NSTEMI (non-ST elevated myocardial infarction) ([place]) 12/21/2015  ? Polysubstance abuse ([place])   ? ?Past Surgical History:  ?Procedure Laterality Date  ? KNEE SURGERY Right   ? LEFT HEART CATH AND CORONARY ANGIOGRAPHY N/A  12/21/2020  ? Procedure: LEFT HEART CATH AND CORONARY ANGIOGRAPHY;  Surgeon: Martinique, Peter M, MD;  Location: [facility];  Service: Cardiovascular;  Laterality: N/A;  ? TEE WITHOUT CARDIOVERSION N/A 04/04/2019  ? Procedure: TRANSESOPHAGEAL ECHOCARDIOGRAM (TEE);  Surgeon: [name], MD;  Location: [facility];  Service: Cardiovascular;  Laterality: N/A;  ? ?Family History  ?Problem Relation Age of Onset  ? Hypertension Father   ? Heart disease Paternal Grandfather   ? ?Social History  ? ?Tobacco Use  ? Smoking status: Every Day  ?  Packs/day: 0.75  ?  Years: 28.00  ?  Pack years: 21.00  ?  Types: Cigarettes  ? Smokeless tobacco: Never  ? Tobacco comments:  ?  declines patch  ?Substance Use Topics  ? Alcohol use: Yes  ?  Alcohol/week: 6.0 standard drinks  ?  Types: 6 Shots of liquor per week  ?  Comment: weekends  ? ?No Known Allergies ? ? ?Review of Systems  ?Constitutional:  Positive for fatigue (minimal). Negative for appetite change.  ?HENT:  Negative for congestion, postnasal drip and sore throat.   ?Eyes: Negative.   ?Respiratory:  Negative for cough, chest tightness and shortness of breath.   ?Cardiovascular:  Negative for chest pain, palpitations and leg swelling.  ?Gastrointestinal:  Negative for abdominal distention and abdominal pain.  ?Endocrine: Negative.   ?Genitourinary: Negative.   ?Musculoskeletal:  Negative for back pain and neck pain.  ?Skin: Negative.   ?Allergic/Immunologic: Negative.   ?Neurological:  Positive  for light-headedness. Negative for dizziness.  ?Hematological:  Negative for adenopathy. Does not bruise/bleed easily.  ?Psychiatric/Behavioral:  Positive for dysphoric mood (brother in law recently died) and sleep disturbance. The patient is not nervous/anxious.   ? ? ? ? ?Physical Exam ?Vitals and nursing note reviewed.  ?Constitutional:   ?   Appearance: Normal appearance.  ?HENT:  ?   Head: Normocephalic and atraumatic.  ?Cardiovascular:  ?   Rate and Rhythm: Normal rate and  regular rhythm.  ?Pulmonary:  ?   Effort: Pulmonary effort is normal. No respiratory distress.  ?   Breath sounds: No wheezing or rales.  ?Abdominal:  ?   General: There is no distension.  ?   Palpations: Abdomen is soft.  ?Musculoskeletal:     ?   General: No tenderness.  ?   Cervical back: Normal range of motion and neck supple.  ?   Right lower leg: No edema.  ?   Left lower leg: No edema.  ?Skin: ?   General: Skin is warm and dry.  ?Neurological:  ?   General: No focal deficit present.  ?   Mental Status: He is alert and oriented to person, place, and time.  ?Psychiatric:     ?   Mood and Affect: Mood normal.     ?   Behavior: Behavior normal.  ? ?Assessment & Plan:  ? ?1: Chronic heart failure with reduced ejection fraction- ?- NYHA class [name] ?- euvolemic today ?- not weighing daily as he doesn't have any scales; set of scales given and he was instructed to weigh every morning after using the bathroom and call for an overnight weight gain of >2 pounds or a weekly weight gain of >5 pounds ?- not adding salt to his food and has been trying to eat low sodium foods ?- on GDMT of  ?- BNP 11/02/21 was 1493.3 ? ? ?2: HTN- ?- BP  ?- BMP from 11/05/21 reviewed and showed sodium 139, potassium 3.7, creatinine 2.1 and GFR 40 ? ?3: Tobacco use- ?- continues to smoke daily ?- complete cessation discussed for 3 minutes with him ? ?4: Atrial fibrillation- ?- cardioverted 04/04/2019 ?- sees cardiology Rockey Situ) 12/09/21 ? ? ?Medication bottles were reviewed. Information given to him regarding [facility]. ? ?

## 2021-11-11 ENCOUNTER — Ambulatory Visit: Payer: Self-pay | Admitting: Family

## 2021-11-11 ENCOUNTER — Telehealth: Payer: Self-pay | Admitting: Family

## 2021-11-11 NOTE — Telephone Encounter (Signed)
Patient did not show for his Heart Failure Clinic appointment on 11/11/21. Will attempt to reschedule.   ?

## 2021-11-12 ENCOUNTER — Other Ambulatory Visit: Payer: Self-pay

## 2021-12-08 NOTE — Progress Notes (Deleted)
NO SHOW

## 2021-12-09 ENCOUNTER — Ambulatory Visit: Payer: Self-pay | Admitting: Cardiovascular Disease

## 2021-12-09 DIAGNOSIS — I480 Paroxysmal atrial fibrillation: Secondary | ICD-10-CM

## 2021-12-09 DIAGNOSIS — Z720 Tobacco use: Secondary | ICD-10-CM

## 2021-12-09 DIAGNOSIS — I5022 Chronic systolic (congestive) heart failure: Secondary | ICD-10-CM

## 2021-12-09 DIAGNOSIS — F1410 Cocaine abuse, uncomplicated: Secondary | ICD-10-CM

## 2021-12-09 DIAGNOSIS — I4892 Unspecified atrial flutter: Secondary | ICD-10-CM

## 2021-12-09 DIAGNOSIS — I10 Essential (primary) hypertension: Secondary | ICD-10-CM

## 2021-12-09 DIAGNOSIS — N1832 Chronic kidney disease, stage 3b: Secondary | ICD-10-CM

## 2021-12-10 ENCOUNTER — Encounter: Payer: Self-pay | Admitting: Cardiovascular Disease

## 2021-12-26 ENCOUNTER — Other Ambulatory Visit: Payer: Self-pay

## 2021-12-26 ENCOUNTER — Telehealth: Payer: Self-pay | Admitting: Pharmacy Technician

## 2021-12-26 NOTE — Telephone Encounter (Signed)
Patient only signed DOH Attestation.  Would need to provide current year's household income if PAP medications were needed. ? ?Lety Cullens J. Vangie Henthorn ?Patient Advocate Specialist ?[facility]  ?

## 2021-12-30 ENCOUNTER — Other Ambulatory Visit: Payer: Self-pay

## 2021-12-31 ENCOUNTER — Other Ambulatory Visit: Payer: Self-pay

## 2022-01-30 ENCOUNTER — Inpatient Hospital Stay: Payer: Medicaid Other

## 2022-01-30 ENCOUNTER — Encounter: Payer: Self-pay | Admitting: Emergency Medicine

## 2022-01-30 ENCOUNTER — Inpatient Hospital Stay
Admission: EM | Admit: 2022-01-30 | Discharge: 2022-03-18 | DRG: 870 | Disposition: E | Payer: Medicaid Other | Attending: Internal Medicine | Admitting: Internal Medicine

## 2022-01-30 ENCOUNTER — Emergency Department: Payer: Medicaid Other

## 2022-01-30 DIAGNOSIS — D631 Anemia in chronic kidney disease: Secondary | ICD-10-CM | POA: Diagnosis present

## 2022-01-30 DIAGNOSIS — G2581 Restless legs syndrome: Secondary | ICD-10-CM | POA: Diagnosis present

## 2022-01-30 DIAGNOSIS — Y712 Prosthetic and other implants, materials and accessory cardiovascular devices associated with adverse incidents: Secondary | ICD-10-CM | POA: Diagnosis not present

## 2022-01-30 DIAGNOSIS — I428 Other cardiomyopathies: Secondary | ICD-10-CM

## 2022-01-30 DIAGNOSIS — F1410 Cocaine abuse, uncomplicated: Secondary | ICD-10-CM | POA: Diagnosis present

## 2022-01-30 DIAGNOSIS — J9601 Acute respiratory failure with hypoxia: Secondary | ICD-10-CM | POA: Diagnosis present

## 2022-01-30 DIAGNOSIS — N1831 Chronic kidney disease, stage 3a: Secondary | ICD-10-CM | POA: Diagnosis present

## 2022-01-30 DIAGNOSIS — G9341 Metabolic encephalopathy: Secondary | ICD-10-CM | POA: Diagnosis not present

## 2022-01-30 DIAGNOSIS — I469 Cardiac arrest, cause unspecified: Secondary | ICD-10-CM | POA: Diagnosis not present

## 2022-01-30 DIAGNOSIS — E872 Acidosis, unspecified: Secondary | ICD-10-CM | POA: Diagnosis present

## 2022-01-30 DIAGNOSIS — E785 Hyperlipidemia, unspecified: Secondary | ICD-10-CM | POA: Diagnosis present

## 2022-01-30 DIAGNOSIS — N179 Acute kidney failure, unspecified: Secondary | ICD-10-CM

## 2022-01-30 DIAGNOSIS — E66813 Obesity, class 3: Secondary | ICD-10-CM | POA: Diagnosis present

## 2022-01-30 DIAGNOSIS — E876 Hypokalemia: Secondary | ICD-10-CM | POA: Diagnosis present

## 2022-01-30 DIAGNOSIS — D6489 Other specified anemias: Secondary | ICD-10-CM | POA: Diagnosis present

## 2022-01-30 DIAGNOSIS — R0602 Shortness of breath: Secondary | ICD-10-CM | POA: Diagnosis present

## 2022-01-30 DIAGNOSIS — Z79899 Other long term (current) drug therapy: Secondary | ICD-10-CM

## 2022-01-30 DIAGNOSIS — A419 Sepsis, unspecified organism: Principal | ICD-10-CM | POA: Diagnosis present

## 2022-01-30 DIAGNOSIS — K760 Fatty (change of) liver, not elsewhere classified: Secondary | ICD-10-CM | POA: Diagnosis present

## 2022-01-30 DIAGNOSIS — Z9911 Dependence on respirator [ventilator] status: Secondary | ICD-10-CM

## 2022-01-30 DIAGNOSIS — J9602 Acute respiratory failure with hypercapnia: Secondary | ICD-10-CM | POA: Diagnosis present

## 2022-01-30 DIAGNOSIS — J9600 Acute respiratory failure, unspecified whether with hypoxia or hypercapnia: Secondary | ICD-10-CM | POA: Diagnosis present

## 2022-01-30 DIAGNOSIS — I427 Cardiomyopathy due to drug and external agent: Secondary | ICD-10-CM | POA: Diagnosis present

## 2022-01-30 DIAGNOSIS — J189 Pneumonia, unspecified organism: Secondary | ICD-10-CM

## 2022-01-30 DIAGNOSIS — I161 Hypertensive emergency: Secondary | ICD-10-CM | POA: Diagnosis present

## 2022-01-30 DIAGNOSIS — R042 Hemoptysis: Secondary | ICD-10-CM | POA: Diagnosis not present

## 2022-01-30 DIAGNOSIS — Z8249 Family history of ischemic heart disease and other diseases of the circulatory system: Secondary | ICD-10-CM

## 2022-01-30 DIAGNOSIS — I4819 Other persistent atrial fibrillation: Secondary | ICD-10-CM | POA: Diagnosis present

## 2022-01-30 DIAGNOSIS — Z6839 Body mass index (BMI) 39.0-39.9, adult: Secondary | ICD-10-CM

## 2022-01-30 DIAGNOSIS — Z66 Do not resuscitate: Secondary | ICD-10-CM | POA: Diagnosis not present

## 2022-01-30 DIAGNOSIS — Z992 Dependence on renal dialysis: Secondary | ICD-10-CM

## 2022-01-30 DIAGNOSIS — Z515 Encounter for palliative care: Secondary | ICD-10-CM | POA: Diagnosis not present

## 2022-01-30 DIAGNOSIS — F17210 Nicotine dependence, cigarettes, uncomplicated: Secondary | ICD-10-CM | POA: Diagnosis present

## 2022-01-30 DIAGNOSIS — R739 Hyperglycemia, unspecified: Secondary | ICD-10-CM | POA: Diagnosis not present

## 2022-01-30 DIAGNOSIS — E871 Hypo-osmolality and hyponatremia: Secondary | ICD-10-CM | POA: Diagnosis not present

## 2022-01-30 DIAGNOSIS — I509 Heart failure, unspecified: Secondary | ICD-10-CM

## 2022-01-30 DIAGNOSIS — I5043 Acute on chronic combined systolic (congestive) and diastolic (congestive) heart failure: Secondary | ICD-10-CM | POA: Diagnosis present

## 2022-01-30 DIAGNOSIS — Z955 Presence of coronary angioplasty implant and graft: Secondary | ICD-10-CM

## 2022-01-30 DIAGNOSIS — I4891 Unspecified atrial fibrillation: Secondary | ICD-10-CM

## 2022-01-30 DIAGNOSIS — T8241XA Breakdown (mechanical) of vascular dialysis catheter, initial encounter: Secondary | ICD-10-CM | POA: Diagnosis not present

## 2022-01-30 DIAGNOSIS — J157 Pneumonia due to Mycoplasma pneumoniae: Secondary | ICD-10-CM | POA: Diagnosis present

## 2022-01-30 DIAGNOSIS — Z91128 Patient's intentional underdosing of medication regimen for other reason: Secondary | ICD-10-CM

## 2022-01-30 DIAGNOSIS — I130 Hypertensive heart and chronic kidney disease with heart failure and stage 1 through stage 4 chronic kidney disease, or unspecified chronic kidney disease: Secondary | ICD-10-CM | POA: Diagnosis present

## 2022-01-30 DIAGNOSIS — I480 Paroxysmal atrial fibrillation: Secondary | ICD-10-CM | POA: Diagnosis present

## 2022-01-30 DIAGNOSIS — N186 End stage renal disease: Secondary | ICD-10-CM

## 2022-01-30 DIAGNOSIS — Z20822 Contact with and (suspected) exposure to covid-19: Secondary | ICD-10-CM | POA: Diagnosis present

## 2022-01-30 DIAGNOSIS — I2721 Secondary pulmonary arterial hypertension: Secondary | ICD-10-CM | POA: Diagnosis present

## 2022-01-30 DIAGNOSIS — R7401 Elevation of levels of liver transaminase levels: Secondary | ICD-10-CM

## 2022-01-30 DIAGNOSIS — I252 Old myocardial infarction: Secondary | ICD-10-CM

## 2022-01-30 DIAGNOSIS — I2510 Atherosclerotic heart disease of native coronary artery without angina pectoris: Secondary | ICD-10-CM | POA: Diagnosis present

## 2022-01-30 DIAGNOSIS — Z7901 Long term (current) use of anticoagulants: Secondary | ICD-10-CM

## 2022-01-30 LAB — EXPECTORATED SPUTUM ASSESSMENT W GRAM STAIN, RFLX TO RESP C

## 2022-01-30 LAB — COMPREHENSIVE METABOLIC PANEL
ALT: 34 U/L (ref 0–44)
AST: 21 U/L (ref 15–41)
Albumin: 3.7 g/dL (ref 3.5–5.0)
Alkaline Phosphatase: 84 U/L (ref 38–126)
Anion gap: 9 (ref 5–15)
BUN: 39 mg/dL — ABNORMAL HIGH (ref 6–20)
CO2: 21 mmol/L — ABNORMAL LOW (ref 22–32)
Calcium: 9.3 mg/dL (ref 8.9–10.3)
Chloride: 107 mmol/L (ref 98–111)
Creatinine, Ser: 2.43 mg/dL — ABNORMAL HIGH (ref 0.61–1.24)
GFR, Estimated: 33 mL/min — ABNORMAL LOW (ref >60)
Glucose, Bld: 113 mg/dL — ABNORMAL HIGH (ref 70–99)
Potassium: 3.4 mmol/L — ABNORMAL LOW (ref 3.5–5.1)
Sodium: 137 mmol/L (ref 135–145)
Total Bilirubin: 1.5 mg/dL — ABNORMAL HIGH (ref 0.3–1.2)
Total Protein: 8 g/dL (ref 6.5–8.1)

## 2022-01-30 LAB — CBC WITH DIFFERENTIAL/PLATELET
Abs Immature Granulocytes: 0.01 10*3/uL (ref 0.00–0.07)
Abs Immature Granulocytes: 0.03 10*3/uL (ref 0.00–0.07)
Basophils Absolute: 0.1 10*3/uL (ref 0.0–0.1)
Basophils Absolute: 0.1 10*3/uL (ref 0.0–0.1)
Basophils Relative: 1 %
Basophils Relative: 1 %
Eosinophils Absolute: 0 10*3/uL (ref 0.0–0.5)
Eosinophils Absolute: 0.1 10*3/uL (ref 0.0–0.5)
Eosinophils Relative: 0 %
Eosinophils Relative: 2 %
HCT: 37.2 % — ABNORMAL LOW (ref 39.0–52.0)
HCT: 40.5 % (ref 39.0–52.0)
Hemoglobin: 11.3 g/dL — ABNORMAL LOW (ref 13.0–17.0)
Hemoglobin: 12 g/dL — ABNORMAL LOW (ref 13.0–17.0)
Immature Granulocytes: 0 %
Immature Granulocytes: 0 %
Lymphocytes Relative: 13 %
Lymphocytes Relative: 26 %
Lymphs Abs: 1.3 10*3/uL (ref 0.7–4.0)
Lymphs Abs: 2 10*3/uL (ref 0.7–4.0)
MCH: 20.4 pg — ABNORMAL LOW (ref 26.0–34.0)
MCH: 20.4 pg — ABNORMAL LOW (ref 26.0–34.0)
MCHC: 29.6 g/dL — ABNORMAL LOW (ref 30.0–36.0)
MCHC: 30.4 g/dL (ref 30.0–36.0)
MCV: 67.1 fL — ABNORMAL LOW (ref 80.0–100.0)
MCV: 68.9 fL — ABNORMAL LOW (ref 80.0–100.0)
Monocytes Absolute: 0.6 10*3/uL (ref 0.1–1.0)
Monocytes Absolute: 0.7 10*3/uL (ref 0.1–1.0)
Monocytes Relative: 7 %
Monocytes Relative: 7 %
Neutro Abs: 5.1 10*3/uL (ref 1.7–7.7)
Neutro Abs: 7.6 10*3/uL (ref 1.7–7.7)
Neutrophils Relative %: 64 %
Neutrophils Relative %: 79 %
Platelets: 229 10*3/uL (ref 150–400)
Platelets: 233 10*3/uL (ref 150–400)
RBC: 5.54 MIL/uL (ref 4.22–5.81)
RBC: 5.88 MIL/uL — ABNORMAL HIGH (ref 4.22–5.81)
RDW: 18.1 % — ABNORMAL HIGH (ref 11.5–15.5)
RDW: 18.8 % — ABNORMAL HIGH (ref 11.5–15.5)
WBC: 7.9 10*3/uL (ref 4.0–10.5)
WBC: 9.7 10*3/uL (ref 4.0–10.5)
nRBC: 0 % (ref 0.0–0.2)
nRBC: 0 % (ref 0.0–0.2)

## 2022-01-30 LAB — BASIC METABOLIC PANEL
Anion gap: 9 (ref 5–15)
BUN: 35 mg/dL — ABNORMAL HIGH (ref 6–20)
CO2: 20 mmol/L — ABNORMAL LOW (ref 22–32)
Calcium: 8.8 mg/dL — ABNORMAL LOW (ref 8.9–10.3)
Chloride: 110 mmol/L (ref 98–111)
Creatinine, Ser: 2.22 mg/dL — ABNORMAL HIGH (ref 0.61–1.24)
GFR, Estimated: 37 mL/min — ABNORMAL LOW (ref >60)
Glucose, Bld: 132 mg/dL — ABNORMAL HIGH (ref 70–99)
Potassium: 3.6 mmol/L (ref 3.5–5.1)
Sodium: 139 mmol/L (ref 135–145)

## 2022-01-30 LAB — MAGNESIUM
Magnesium: 2.3 mg/dL (ref 1.7–2.4)
Magnesium: 2.1 mg/dL (ref 1.7–2.4)

## 2022-01-30 LAB — BLOOD GAS, ARTERIAL
Acid-base deficit: 2.5 mmol/L — ABNORMAL HIGH (ref 0.0–2.0)
Bicarbonate: 21.7 mmol/L (ref 20.0–28.0)
Delivery systems: POSITIVE
Expiratory PAP: 6 cmH2O
FIO2: 60 %
Inspiratory PAP: 12 cmH2O
O2 Saturation: 99.2 %
Patient temperature: 37
pCO2 arterial: 35 mmHg (ref 32–48)
pH, Arterial: 7.4 (ref 7.35–7.45)
pO2, Arterial: 116 mmHg — ABNORMAL HIGH (ref 83–108)

## 2022-01-30 LAB — URINALYSIS, COMPLETE (UACMP) WITH MICROSCOPIC
Bilirubin Urine: NEGATIVE
Glucose, UA: NEGATIVE mg/dL
Hgb urine dipstick: NEGATIVE
Ketones, ur: NEGATIVE mg/dL
Leukocytes,Ua: NEGATIVE
Nitrite: NEGATIVE
Protein, ur: NEGATIVE mg/dL
Specific Gravity, Urine: 1.006 (ref 1.005–1.030)
Squamous Epithelial / HPF: NONE SEEN (ref 0–5)
pH: 5 (ref 5.0–8.0)

## 2022-01-30 LAB — PROTIME-INR
INR: 1.5 — ABNORMAL HIGH (ref 0.8–1.2)
Prothrombin Time: 17.8 seconds — ABNORMAL HIGH (ref 11.4–15.2)

## 2022-01-30 LAB — URINE DRUG SCREEN, QUALITATIVE (ARMC ONLY)
Amphetamines, Ur Screen: NOT DETECTED
Barbiturates, Ur Screen: NOT DETECTED
Benzodiazepine, Ur Scrn: NOT DETECTED
Cannabinoid 50 Ng, Ur ~~LOC~~: NOT DETECTED
Cocaine Metabolite,Ur ~~LOC~~: NOT DETECTED
MDMA (Ecstasy)Ur Screen: NOT DETECTED
Methadone Scn, Ur: NOT DETECTED
Opiate, Ur Screen: NOT DETECTED
Phencyclidine (PCP) Ur S: NOT DETECTED
Tricyclic, Ur Screen: NOT DETECTED

## 2022-01-30 LAB — HEPATIC FUNCTION PANEL
ALT: 32 U/L (ref 0–44)
AST: 21 U/L (ref 15–41)
Albumin: 3.4 g/dL — ABNORMAL LOW (ref 3.5–5.0)
Alkaline Phosphatase: 80 U/L (ref 38–126)
Bilirubin, Direct: 0.2 mg/dL (ref 0.0–0.2)
Indirect Bilirubin: 1.3 mg/dL — ABNORMAL HIGH (ref 0.3–0.9)
Total Bilirubin: 1.5 mg/dL — ABNORMAL HIGH (ref 0.3–1.2)
Total Protein: 7.6 g/dL (ref 6.5–8.1)

## 2022-01-30 LAB — TROPONIN I (HIGH SENSITIVITY)
Troponin I (High Sensitivity): 553 ng/L (ref <18)
Troponin I (High Sensitivity): 472 ng/L (ref <18)
Troponin I (High Sensitivity): 500 ng/L (ref <18)
Troponin I (High Sensitivity): 450 ng/L (ref <18)

## 2022-01-30 LAB — APTT
aPTT: 50 seconds — ABNORMAL HIGH (ref 24–36)
aPTT: 70 seconds — ABNORMAL HIGH (ref 24–36)
aPTT: 59 seconds — ABNORMAL HIGH (ref 24–36)
aPTT: 60 seconds — ABNORMAL HIGH (ref 24–36)

## 2022-01-30 LAB — RESP PANEL BY RT-PCR (FLU A&B, COVID) ARPGX2
Influenza A by PCR: NEGATIVE
Influenza B by PCR: NEGATIVE
SARS Coronavirus 2 by RT PCR: NEGATIVE

## 2022-01-30 LAB — BRAIN NATRIURETIC PEPTIDE: B Natriuretic Peptide: 793.1 pg/mL — ABNORMAL HIGH (ref 0.0–100.0)

## 2022-01-30 LAB — D-DIMER, QUANTITATIVE: D-Dimer, Quant: 0.4 ug/mL-FEU (ref 0.00–0.50)

## 2022-01-30 LAB — GLUCOSE, CAPILLARY: Glucose-Capillary: 125 mg/dL — ABNORMAL HIGH (ref 70–99)

## 2022-01-30 LAB — HEPARIN LEVEL (UNFRACTIONATED): Heparin Unfractionated: >1.1 IU/mL — ABNORMAL HIGH (ref 0.30–0.70)

## 2022-01-30 LAB — PROCALCITONIN: Procalcitonin: 0.19 ng/mL

## 2022-01-30 LAB — MRSA NEXT GEN BY PCR, NASAL: MRSA by PCR Next Gen: NOT DETECTED

## 2022-01-30 LAB — LACTIC ACID, PLASMA
Lactic Acid, Venous: 1.2 mmol/L (ref 0.5–1.9)
Lactic Acid, Venous: 1 mmol/L (ref 0.5–1.9)

## 2022-01-30 LAB — TSH: TSH: 0.92 u[IU]/mL (ref 0.350–4.500)

## 2022-01-30 MED ORDER — POTASSIUM CHLORIDE 10 MEQ/100ML IV SOLN
10.0000 meq | INTRAVENOUS | Status: AC
  Administered 2022-01-30: 10 meq via INTRAVENOUS
  Filled 2022-01-30: qty 100

## 2022-01-30 MED ORDER — FUROSEMIDE 10 MG/ML IJ SOLN
60.0000 mg | INTRAMUSCULAR | Status: AC
  Administered 2022-01-30: 60 mg via INTRAVENOUS
  Filled 2022-01-30: qty 6

## 2022-01-30 MED ORDER — GUAIFENESIN-DM 100-10 MG/5ML PO SYRP
5.0000 mL | ORAL_SOLUTION | ORAL | Status: DC | PRN
  Administered 2022-01-30 – 2022-01-31 (×3): 5 mL via ORAL
  Filled 2022-01-30 (×3): qty 5

## 2022-01-30 MED ORDER — LIDOCAINE 5 % EX PTCH
1.0000 | MEDICATED_PATCH | CUTANEOUS | Status: DC
Start: 2022-01-30 — End: 2022-02-12
  Administered 2022-01-30 – 2022-02-12 (×11): 1 via TRANSDERMAL
  Filled 2022-01-30 (×14): qty 1

## 2022-01-30 MED ORDER — MORPHINE SULFATE (PF) 2 MG/ML IV SOLN
2.0000 mg | INTRAVENOUS | Status: DC | PRN
  Administered 2022-01-30 – 2022-01-31 (×2): 2 mg via INTRAVENOUS
  Filled 2022-01-30 (×2): qty 1

## 2022-01-30 MED ORDER — LORAZEPAM 1 MG PO TABS
0.5000 mg | ORAL_TABLET | Freq: Once | ORAL | Status: AC
  Administered 2022-01-30: 0.5 mg via ORAL
  Filled 2022-01-30: qty 1

## 2022-01-30 MED ORDER — SODIUM CHLORIDE 0.9 % IV SOLN
2.0000 g | Freq: Once | INTRAVENOUS | Status: AC
  Administered 2022-01-30: 2 g via INTRAVENOUS
  Filled 2022-01-30: qty 20

## 2022-01-30 MED ORDER — ACETAMINOPHEN 325 MG PO TABS
650.0000 mg | ORAL_TABLET | Freq: Four times a day (QID) | ORAL | Status: DC | PRN
Start: 2022-01-30 — End: 2022-02-13
  Administered 2022-01-30 – 2022-02-05 (×4): 650 mg via ORAL
  Filled 2022-01-30 (×4): qty 2

## 2022-01-30 MED ORDER — FUROSEMIDE 10 MG/ML IJ SOLN
40.0000 mg | Freq: Two times a day (BID) | INTRAMUSCULAR | Status: DC
  Administered 2022-01-30 (×2): 40 mg via INTRAVENOUS
  Filled 2022-01-30 (×2): qty 4

## 2022-01-30 MED ORDER — POTASSIUM CHLORIDE 10 MEQ/100ML IV SOLN
10.0000 meq | Freq: Once | INTRAVENOUS | Status: AC
  Administered 2022-01-30: 10 meq via INTRAVENOUS
  Filled 2022-01-30: qty 100

## 2022-01-30 MED ORDER — HEPARIN BOLUS VIA INFUSION
1500.0000 [IU] | Freq: Once | INTRAVENOUS | Status: AC
Start: 2022-01-30 — End: 2022-01-31
  Administered 2022-01-31: 1500 [IU] via INTRAVENOUS
  Filled 2022-01-30: qty 1500

## 2022-01-30 MED ORDER — POTASSIUM CHLORIDE CRYS ER 20 MEQ PO TBCR
40.0000 meq | EXTENDED_RELEASE_TABLET | Freq: Once | ORAL | Status: DC
  Filled 2022-01-30: qty 2

## 2022-01-30 MED ORDER — SODIUM CHLORIDE 0.9 % IV SOLN
2.0000 g | INTRAVENOUS | Status: DC
  Administered 2022-01-31: 2 g via INTRAVENOUS
  Filled 2022-01-30: qty 2

## 2022-01-30 MED ORDER — ISOSORBIDE MONONITRATE ER 30 MG PO TB24
120.0000 mg | ORAL_TABLET | Freq: Every day | ORAL | Status: DC
  Administered 2022-01-30: 120 mg via ORAL
  Filled 2022-01-30: qty 4

## 2022-01-30 MED ORDER — SODIUM CHLORIDE 0.9 % IV SOLN
100.0000 mg | Freq: Two times a day (BID) | INTRAVENOUS | Status: DC
  Administered 2022-01-30 (×2): 100 mg via INTRAVENOUS
  Filled 2022-01-30 (×3): qty 100

## 2022-01-30 MED ORDER — DILTIAZEM HCL-DEXTROSE 125-5 MG/125ML-% IV SOLN (PREMIX)
5.0000 mg/h | INTRAVENOUS | Status: DC
  Administered 2022-01-30: 5 mg/h via INTRAVENOUS
  Filled 2022-01-30: qty 125

## 2022-01-30 MED ORDER — AMIODARONE HCL IN DEXTROSE 360-4.14 MG/200ML-% IV SOLN
60.0000 mg/h | INTRAVENOUS | Status: AC
  Administered 2022-01-30: 60 mg/h via INTRAVENOUS
  Filled 2022-01-30: qty 200

## 2022-01-30 MED ORDER — MORPHINE SULFATE (PF) 2 MG/ML IV SOLN
1.0000 mg | INTRAVENOUS | Status: DC | PRN
  Administered 2022-01-30 (×2): 1 mg via INTRAVENOUS
  Filled 2022-01-30 (×2): qty 1

## 2022-01-30 MED ORDER — HEPARIN BOLUS VIA INFUSION
4000.0000 [IU] | Freq: Once | INTRAVENOUS | Status: AC
  Administered 2022-01-30: 4000 [IU] via INTRAVENOUS
  Filled 2022-01-30: qty 4000

## 2022-01-30 MED ORDER — POTASSIUM CHLORIDE CRYS ER 20 MEQ PO TBCR
40.0000 meq | EXTENDED_RELEASE_TABLET | Freq: Once | ORAL | Status: DC
Start: 2022-01-30 — End: 2022-01-30

## 2022-01-30 MED ORDER — SODIUM CHLORIDE 0.9 % IV SOLN
500.0000 mg | Freq: Once | INTRAVENOUS | Status: AC
  Administered 2022-01-30: 500 mg via INTRAVENOUS
  Filled 2022-01-30: qty 5

## 2022-01-30 MED ORDER — CHLORHEXIDINE GLUCONATE CLOTH 2 % EX PADS
6.0000 | MEDICATED_PAD | Freq: Every day | CUTANEOUS | Status: DC
  Administered 2022-01-30 – 2022-02-14 (×14): 6 via TOPICAL

## 2022-01-30 MED ORDER — ATORVASTATIN CALCIUM 20 MG PO TABS
40.0000 mg | ORAL_TABLET | Freq: Every day | ORAL | Status: DC
  Administered 2022-01-30 – 2022-01-31 (×2): 40 mg via ORAL
  Filled 2022-01-30 (×2): qty 2

## 2022-01-30 MED ORDER — AMIODARONE HCL IN DEXTROSE 360-4.14 MG/200ML-% IV SOLN
30.0000 mg/h | INTRAVENOUS | Status: DC
  Administered 2022-01-30: 30 mg/h via INTRAVENOUS
  Administered 2022-01-30 – 2022-01-31 (×6): 60 mg/h via INTRAVENOUS
  Administered 2022-02-01 – 2022-02-02 (×3): 30 mg/h via INTRAVENOUS
  Filled 2022-01-30 (×10): qty 200

## 2022-01-30 MED ORDER — AMLODIPINE BESYLATE 10 MG PO TABS
10.0000 mg | ORAL_TABLET | Freq: Every day | ORAL | Status: DC
  Administered 2022-01-30: 10 mg via ORAL
  Filled 2022-01-30: qty 1

## 2022-01-30 MED ORDER — ONDANSETRON HCL 4 MG/2ML IJ SOLN
INTRAMUSCULAR | Status: AC
  Administered 2022-01-30: 4 mg
  Filled 2022-01-30: qty 2

## 2022-01-30 MED ORDER — HEPARIN BOLUS VIA INFUSION
1500.0000 [IU] | Freq: Once | INTRAVENOUS | Status: AC
  Administered 2022-01-30: 1500 [IU] via INTRAVENOUS
  Filled 2022-01-30: qty 1500

## 2022-01-30 MED ORDER — FUROSEMIDE 10 MG/ML IJ SOLN
40.0000 mg | Freq: Once | INTRAMUSCULAR | Status: AC
  Administered 2022-01-30: 40 mg via INTRAVENOUS
  Filled 2022-01-30: qty 4

## 2022-01-30 MED ORDER — HEPARIN (PORCINE) 25000 UT/250ML-% IV SOLN
2100.0000 [IU]/h | INTRAVENOUS | Status: DC
  Administered 2022-01-30: 1550 [IU]/h via INTRAVENOUS
  Administered 2022-01-30: 1350 [IU]/h via INTRAVENOUS
  Administered 2022-01-31: 1850 [IU]/h via INTRAVENOUS
  Administered 2022-01-31 – 2022-02-02 (×3): 2000 [IU]/h via INTRAVENOUS
  Administered 2022-02-02 – 2022-02-04 (×4): 2100 [IU]/h via INTRAVENOUS
  Filled 2022-01-30 (×10): qty 250

## 2022-01-30 MED ORDER — DILTIAZEM HCL 25 MG/5ML IV SOLN
10.0000 mg | Freq: Once | INTRAVENOUS | Status: AC
  Administered 2022-01-30: 10 mg via INTRAVENOUS
  Filled 2022-01-30: qty 5

## 2022-01-30 MED ORDER — HYDRALAZINE HCL 50 MG PO TABS
50.0000 mg | ORAL_TABLET | Freq: Three times a day (TID) | ORAL | Status: DC
Start: 2022-01-30 — End: 2022-01-31
  Administered 2022-01-30 (×3): 50 mg via ORAL
  Filled 2022-01-30 (×3): qty 1

## 2022-01-30 MED ORDER — FUROSEMIDE 10 MG/ML IJ SOLN: 60.0000 mg | Freq: Once | INTRAMUSCULAR | Status: DC

## 2022-01-30 NOTE — Progress Notes (Signed)
ANTICOAGULATION CONSULT NOTE   Pharmacy Consult for heparin infusion Indication: ACS/STEMI  No Known Allergies  Patient Measurements: Height: 6' (182.9 cm) Weight: (!) 140 kg (308 lb 10.3 oz) IBW/kg (Calculated) : 77.6 Heparin Dosing Weight: 109.9 kg  Vital Signs: Temp: 98.8 F (37.1 C) (06/15 0016) Temp Source: Oral (06/15 0016) BP: 140/105 (06/15 0030) Pulse Rate: 130 (06/15 0030)  Labs: Recent Labs    02/01/2022 0023  HGB 12.0*  HCT 40.5  PLT 233  CREATININE 2.43*  TROPONINIHS 553*    Estimated Creatinine Clearance: 58.1 mL/min (A) (by C-G formula based on SCr of 2.43 mg/dL (H)).   Medical History: Past Medical History:  Diagnosis Date   Acute on chronic combined systolic and diastolic CHF (congestive heart failure) ([name])    Acute on chronic systolic CHF (congestive heart failure) ([place]) 97/58/8325   Acute systolic congestive heart failure ([name])    Atrial flutter, paroxysmal ([name])    CHF (congestive heart failure) ([place]) 03/30/2021   Chronic combined systolic and diastolic CHF (congestive heart failure) ([name])    Chronic kidney disease, stage III (moderate) ([name])    Dysrhythmia    brief episode afib, cardioverted did not return   Hypertension    Hypertensive crisis 01/20/2021   Hypertensive emergency 12/21/2020   NSTEMI (non-ST elevated myocardial infarction) ([place]) 12/21/2015   Polysubstance abuse ([place])     Medications:  PTA meds include Xarelto 20 mg daily.  Last dose unknown.  Checking baseline HL, aPTT, & INR  Assessment: Pt is 41 yo male presenting to ED c/o SOB, found w/ elevated BNP and Troponin I lvls.  Goal of Therapy:  aPTT goal: 66-102 Heparin level 0.3-0.7 units/ml Monitor platelets by anticoagulation protocol: Yes   Plan:  Bolus 4000 units x 1 Start heparin infusion at 1350 units/hr BL HL > 1.1, will follow aPTT until correlates w/ HL Recheck aPTT in 6 hrs after start of infusion HL & CBC daily while on heparin  Renda Rolls, PharmD,  [facility] 02/03/2022 1:29 AM

## 2022-01-30 NOTE — ED Triage Notes (Signed)
Pt presents via POV with SOB for the last 3 days. Hx of CHF & afib hasnt had meds in 4 days. EKG shows afib RVR HR in the 130s. Pt denies CP.

## 2022-01-30 NOTE — ED Notes (Signed)
Dr. Alfred Levins notified pf pts critical troponin level

## 2022-01-30 NOTE — Consult Note (Signed)
NAME:  Gerald [name], MRN:  [id number], DOB:  1980/08/31, LOS: 0 ADMISSION DATE:  02/09/2022 CONSULTATION DATE:  01/26/2022    History of Present Illness:  41 y.o. male with history of chronic systolic and diastolic CHF last EF measured was 20 to 25% in January 2023 with history of hypertension, chronic kidney disease stage III baseline creatinine around 2,  anemia polysubstance abuse admits to taking cocaine presents to the ER because of worsening shortness of breath for the last 3 days with no associated chest pain has some productive cough.   Patient states he has not taken his medicines for last 3 days.  Admitted to [facility] with SOB    ED Course: In the ER patient was hypoxic initially requiring BiPAP but after placing on BiPAP patient had vomiting and had to be taken off the BiPAP was placed on high flow oxygen.  Patient chest x-ray shows congestion was placed on IV Lasix and also empirically on antibiotics.   BNP 790 high sensitive troponin was 553 and 4 and 72 was started on IV heparin.  Patient admitted for acute hypoxic respiratory failure likely from CHF.  Since patient was in A-fib with RVR initially was started on Cardizem infusion.  Transition to amiodarone.  PCCM consulted to assess worsening SOB and increased WOB today 6/15     Pertinent  Medical History   Active Ambulatory Problems    Diagnosis Date Noted   Acute respiratory distress 04/01/2019   Acute on chronic combined systolic and diastolic CHF (congestive heart failure) ([name])    Atrial flutter ([name])    Essential hypertension    PAF (paroxysmal atrial fibrillation) ([place]) 07/05/2020   Elevated troponin 07/05/2020   Microcytic anemia 07/05/2020   Hypokalemia 07/05/2020   Hypertensive emergency 12/21/2020   Stage 3a chronic kidney disease (CKD) ([place]) - baseline SCr 1.8-2.0 12/21/2020   Cocaine abuse ([place]) 01/20/2021   Obesity, Class III, BMI 40-49.9 (morbid obesity) ([place]) 01/20/2021   Restless leg 07/08/2021    Severe uncontrolled hypertension 09/05/2021   Atrial fibrillation with rapid ventricular response ([place]) 11/02/2021   Resolved Ambulatory Problems    Diagnosis Date Noted   NSTEMI (non-ST elevated myocardial infarction) ([place]) 12/21/2015   Community acquired pneumonia 12/21/2015   Persistent atrial fibrillation ([name])    Acute systolic congestive heart failure ([name])    Acute on chronic systolic CHF (congestive heart failure) ([place]) 07/05/2020   Hypertensive urgency 07/05/2020   Hypertensive crisis 56/38/7564   Acute diastolic CHF (congestive heart failure) ([place]) 03/30/2021   CHF (congestive heart failure) ([place]) 03/30/2021   Hemoptysis 07/07/2021   Past Medical History:  Diagnosis Date   Atrial flutter, paroxysmal ([name])    Chronic combined systolic and diastolic CHF (congestive heart failure) ([place])    Chronic kidney disease, stage III (moderate) ([place])    Dysrhythmia    Hypertension    Polysubstance abuse ([place])      Significant Hospital Events: Including procedures, antibiotic start and stop dates in addition to other pertinent events   6/14 admitted for acute sCHF 6/15 PCCM consulted for impending resp failure     Micro Data:  COVID NEG  Antimicrobials:   Antibiotics Given (last 72 hours)     Date/Time Action Medication Dose Rate   02/10/2022 0147 New Bag/Given   cefTRIAXone (ROCEPHIN) 2 g in sodium chloride 0.9 % 100 mL IVPB 2 g 200 mL/hr   01/18/2022 0359 New Bag/Given   azithromycin (ZITHROMAX) 500 mg in sodium chloride 0.9 % 250  mL IVPB 500 mg 250 mL/hr   01/27/2022 0747 New Bag/Given   doxycycline (VIBRAMYCIN) 100 mg in sodium chloride 0.9 % 250 mL IVPB 100 mg 125 mL/hr            Interim History / Subjective:  Alert and awake Moderate resp distress Severe sCHF Renal failure CXR shows b/l opacities High risk for intubation and cardiac arrest     Objective   Blood pressure (!) 124/96, pulse (!) 122, temperature 99.3 F (37.4 C), temperature source Oral,  resp. rate (!) 44, height 6' (1.829 m), weight 131.5 kg, SpO2 92 %.    FiO2 (%):  [60 %-70 %] 70 %   Intake/Output Summary (Last 24 hours) at 02/08/2022 1055 Last data filed at 02/09/2022 1000 Gross per 24 hour  Intake 644.9 ml  Output 600 ml  Net 44.9 ml   Filed Weights   01/22/2022 0016 02/05/2022 0300  Weight: (!) 140 kg 131.5 kg      REVIEW OF SYSTEMS +SOB +swelling No meds in 1 week    PHYSICAL EXAMINATION:  GENERAL:critically ill appearing, +resp distress EYES: Pupils equal, round, reactive to light.  No scleral icterus.  MOUTH: Moist mucosal membrane.  NECK: Supple.  PULMONARY: +rhonchi, +wheezing CARDIOVASCULAR: S1 and S2.  No murmurs  GASTROINTESTINAL: Soft, nontender, -distended. Positive bowel sounds.  MUSCULOSKELETAL: No swelling, clubbing, or edema.  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 -oxygen as needed -Lasix as tolerated -follow up cardiac enzymes as indicated Follow up cardiology recs Continue AMIO AND HEP infusion   CARDIAC ICU monitoring   ACUTE KIDNEY INJURY/Renal Failure -continue Foley Catheter-assess need -Avoid nephrotoxic agents -Follow urine output, BMP -Ensure adequate renal perfusion, optimize oxygenation -Renal dose medications   Intake/Output Summary (Last 24 hours) at 02/02/2022 1055 Last data filed at 02/06/2022 1000 Gross per 24 hour  Intake 644.9 ml  Output 600 ml  Net 44.9 ml    ENDO -  ICU hypoglycemic\Hyperglycemia protocol -check FSBS per protocol   GI GI PROPHYLAXIS as indicated  NUTRITIONAL STATUS DIET-->NPO Constipation protocol as indicated   ELECTROLYTES -follow labs as needed -replace as needed -pharmacy consultation and following   ACUTE ANEMIA- TRANSFUSE AS NEEDED CONSIDER TRANSFUSION  IF HGB<7      Best practice (right click and "Reselect all SmartList Selections" daily)  Diet: NPO DVT prophylaxis: Systemic AC Mobility:  bed rest  Code Status:  FULL Disposition:ICU  Labs   CBC: Recent Labs  Lab 01/23/2022 0023 02/06/2022 0638  WBC 7.9 9.7  NEUTROABS 5.1 7.6  HGB 12.0* 11.3*  HCT 40.5 37.2*  MCV 68.9* 67.1*  PLT 233 353    Basic Metabolic Panel: Recent Labs  Lab 02/05/2022 0023 01/26/2022 0638  NA 137 139  K 3.4* 3.6  CL 107 110  CO2 21* 20*  GLUCOSE 113* 132*  BUN 39* 35*  CREATININE 2.43* 2.22*  CALCIUM 9.3 8.8*  MG 2.3 2.1   GFR: Estimated Creatinine Clearance: 61.4 mL/min (A) (by C-G formula  based on SCr of 2.22 mg/dL (H)). Recent Labs  Lab 01/16/2022 0023 01/19/2022 0117 02/10/2022 0153 01/29/2022 0537 01/23/2022 0638  PROCALCITON  --   --   --  0.19  --   WBC 7.9  --   --   --  9.7  LATICACIDVEN  --  1.2 1.0  --   --     Liver Function Tests: Recent Labs  Lab 01/18/2022 0023 02/09/2022 0638  AST 21 21  ALT 34 32  ALKPHOS 84 80  BILITOT 1.5* 1.5*  PROT 8.0 7.6  ALBUMIN 3.7 3.4*   No results for input(s): "LIPASE", "AMYLASE" in the last 168 hours. No results for input(s): "AMMONIA" in the last 168 hours.  ABG No results found for: "PHART", "PCO2ART", "PO2ART", "HCO3", "TCO2", "ACIDBASEDEF", "O2SAT"   Coagulation Profile: Recent Labs  Lab 01/22/2022 0028  INR 1.5*    Cardiac Enzymes: No results for input(s): "CKTOTAL", "CKMB", "CKMBINDEX", "TROPONINI" in the last 168 hours.  HbA1C: Hgb A1c MFr Bld  Date/Time Value Ref Range Status  09/05/2021 05:00 PM 6.1 (H) 4.8 - 5.6 % Final    Comment:    (NOTE)          Prediabetes: 5.7 - 6.4         Diabetes: >6.4         Glycemic control for adults with diabetes: <7.0   03/30/2021 08:25 PM 6.2 (H) 4.8 - 5.6 % Final    Comment:    (NOTE) Pre diabetes:          5.7%-6.4%  Diabetes:              >6.4%  Glycemic control for   <7.0% adults with diabetes     CBG: Recent Labs  Lab 01/16/2022 0254  GLUCAP 125*     Past Medical History:  He,  has a past medical history of Acute on chronic combined systolic and diastolic CHF (congestive heart failure) ([place]), Acute on chronic systolic CHF (congestive heart failure) ([place]) (35/00/9381), Acute systolic congestive heart failure ([place]), Atrial flutter, paroxysmal ([place]), CHF (congestive heart failure) ([place]) (03/30/2021), Chronic combined systolic and diastolic CHF (congestive heart failure) ([place]), Chronic kidney disease, stage III (moderate) ([place]), Dysrhythmia, Hypertension, Hypertensive crisis (01/20/2021), Hypertensive emergency (12/21/2020), NSTEMI (non-ST elevated myocardial infarction) ([place]) (12/21/2015), and Polysubstance abuse ([place]).   Surgical History:   Past Surgical History:  Procedure Laterality Date   KNEE SURGERY Right    LEFT HEART CATH AND CORONARY ANGIOGRAPHY N/A 12/21/2020   Procedure: LEFT HEART CATH AND CORONARY ANGIOGRAPHY;  Surgeon: Martinique, Peter M, MD;  Location: [facility];  Service: Cardiovascular;  Laterality: N/A;   TEE WITHOUT CARDIOVERSION N/A 04/04/2019   Procedure: TRANSESOPHAGEAL ECHOCARDIOGRAM (TEE);  Surgeon: [name], MD;  Location: [facility];  Service: Cardiovascular;  Laterality: N/A;     Social History:   reports that he has been smoking cigarettes. He has a 21.00 pack-year smoking history. He has never used smokeless tobacco. He reports current alcohol use of about 6.0 standard drinks of alcohol per week. He reports current drug use. Drugs: Cocaine and Marijuana.   Family History:  His family history includes Heart disease in his paternal grandfather;  Hypertension in his father.   Allergies No Known Allergies   Home Medications  Prior to Admission medications   Medication Sig Start Date End Date Taking? Authorizing Provider  amLODipine (NORVASC) 5 MG tablet Take 2 tablets (10 mg total) by mouth once daily. 11/05/21 02/03/22 Yes Billie Ruddy,  Otila Kluver, MD  atorvastatin (LIPITOR) 40 MG tablet Take 1 tablet (40 mg total) by mouth once daily. 11/05/21 02/03/22 Yes Enzo Bi, MD  carvedilol (COREG) 12.5 MG tablet Take 1 tablet (12.5 mg total) by mouth 2 (two) times daily with a meal. 11/05/21 02/03/22 Yes Enzo Bi, MD  cyclobenzaprine (FLEXERIL) 5 MG tablet Take 5 mg by mouth 3 (three) times daily as needed. 01/03/22  Yes [provider]  dapagliflozin propanediol (FARXIGA) 10 MG TABS tablet Take 1 tablet (10 mg total) by mouth once daily before breakfast. 11/05/21  Yes Enzo Bi, MD  FEROSUL 325 (65 Fe) MG tablet Take 325 mg by mouth daily. 01/03/22  Yes [provider]  hydrALAZINE (APRESOLINE) 100 MG tablet Take 1 tablet (100 mg total) by mouth 3 (three) times daily. Patient taking differently: Take 50 mg by mouth 3 (three) times daily. 11/05/21 02/03/22 Yes Enzo Bi, MD  lidocaine (LIDODERM) 5 % Place 1 patch onto the skin daily. Place 1 patch on the skin daily. Apply to affected area for 12 hours only each day (then remove patch) 01/03/22 02/02/22 Yes [provider]  losartan (COZAAR) 25 MG tablet Take 1 tablet (25 mg total) by mouth once daily. 11/05/21 02/03/22 Yes Enzo Bi, MD  nicotine (NICODERM CQ - DOSED IN MG/24 HOURS) 21 mg/24hr patch Place 1 patch onto the skin daily. 01/03/22  Yes [provider]  nicotine polacrilex (NICORETTE) 4 MG gum Place 1 each inside cheek every hour as needed. Chew 1 piece (4 mg total) and park in cheek every hour as needed for smoking cessation. 01/03/2022 02/02/2022 [place] 01/03/2022 01/03/22 02/02/22 Yes [provider]  spironolactone (ALDACTONE) 50 MG tablet Take 50 mg by mouth  daily. 01/03/22  Yes [provider]  torsemide (DEMADEX) 20 MG tablet Take 1 tablet (20 mg total) by mouth once daily. 11/05/21 02/03/22 Yes Enzo Bi, MD  isosorbide mononitrate (IMDUR) 60 MG 24 hr tablet Take 2 tablets (120 mg total) by mouth once daily. 11/05/21 12/05/21  Enzo Bi, MD  potassium chloride (KLOR-CON) 10 MEQ tablet Take 1 tablet (10 mEq total) by mouth once daily. Patient not taking: Reported on 02/13/2022 11/05/21 02/03/22  Enzo Bi, MD  rivaroxaban (XARELTO) 20 MG TABS tablet Take 1 tablet (20 mg total) by mouth once daily with supper. 11/05/21 02/03/22  Enzo Bi, MD  potassium chloride (KLOR-CON M) 10 MEQ tablet Take 1 tablet (10 mEq total) by mouth daily. 09/09/21 11/05/21  Dorethea Clan, DO       DVT/GI PRX  assessed I Assessed the need for Labs I Assessed the need for Foley I Assessed the need for Central Venous Line Family Discussion when available I Assessed the need for Mobilization I made an Assessment of medications to be adjusted accordingly Safety Risk assessment completed  CASE DISCUSSED IN MULTIDISCIPLINARY ROUNDS WITH ICU TEAM   Critical Care Time devoted to patient care services described in this note is 65 minutes.  Critical care was necessary to treat /prevent imminent and life-threatening deterioration.  Patient is critically ill. Patient with Multiorgan failure and at high risk for cardiac arrest and death.    Corrin Parker, M.D.  [facility]  Medical Director [place] Director [facility] Cardio-Pulmonary Department

## 2022-01-30 NOTE — ED Notes (Signed)
Pt placed on 2 liters nasal cannula at this time due to oxygen being 87%

## 2022-01-30 NOTE — ED Notes (Signed)
Respiratory placing pt on bipap at this time

## 2022-01-30 NOTE — Progress Notes (Signed)
ANTICOAGULATION CONSULT NOTE   Pharmacy Consult for Heparin Infusion Indication: ACS/STEMI  Patient Measurements: Height: 6' (182.9 cm) Weight: 131.5 kg (290 lb) IBW/kg (Calculated) : 77.6 Heparin Dosing Weight: 109.9 kg  Labs: Recent Labs    02/08/2022 0023 02/12/2022 0028 02/05/2022 0153 01/27/2022 0537 02/10/2022 0638 01/29/2022 1343  HGB 12.0*  --   --   --  11.3*  --   HCT 40.5  --   --   --  37.2*  --   PLT 233  --   --   --  229  --   APTT  --  50*  --   --  70* 59*  LABPROT  --  17.8*  --   --   --   --   INR  --  1.5*  --   --   --   --   HEPARINUNFRC  --   --  >1.10*  --   --   --   CREATININE 2.43*  --   --   --  2.22*  --   TROPONINIHS 553*  --  472* 500* 450*  --      Estimated Creatinine Clearance: 61.4 mL/min (A) (by C-G formula based on SCr of 2.22 mg/dL (H)).   Medical History: Past Medical History:  Diagnosis Date   Acute on chronic combined systolic and diastolic CHF (congestive heart failure) ([name])    Acute on chronic systolic CHF (congestive heart failure) ([place]) 40/97/3532   Acute systolic congestive heart failure ([name])    Atrial flutter, paroxysmal ([name])    CHF (congestive heart failure) ([place]) 03/30/2021   Chronic combined systolic and diastolic CHF (congestive heart failure) ([name])    Chronic kidney disease, stage III (moderate) ([name])    Dysrhythmia    brief episode afib, cardioverted did not return   Hypertension    Hypertensive crisis 01/20/2021   Hypertensive emergency 12/21/2020   NSTEMI (non-ST elevated myocardial infarction) ([place]) 12/21/2015   Polysubstance abuse ([place])     Medications:  PTA meds include Xarelto 20 mg daily.  Last dose unknown.  Checking baseline HL, aPTT, & INR  Assessment: Pt is 41 yo male presenting to ED c/o SOB, found w/ elevated BNP and Troponin I lvls.  Goal of Therapy:  aPTT goal: 66-102 Heparin level 0.3-0.7 units/ml Monitor platelets by anticoagulation protocol: Yes   Plan:  aPTT subtherapeutic Heparin 1500 unit IV  bolus and increase heparin infusion to  Continue heparin infusion at 1550 units/hr BL HL > 1.1, will follow aPTT until correlates w/ HL Recheck aPTT in 6 hrs HL & CBC daily while on heparin  Benita Gutter  02/09/2022 2:42 PM

## 2022-01-30 NOTE — Consult Note (Signed)
Cardiology Consultation:   Patient ID: Gerald [name] MRN: [id number]; DOB: 29-May-1981  Admit date: 02/04/2022 Date of Consult: 01/23/2022  PCP:  Patient, [name]   [place] Providers Cardiologist:  Ida Rogue, MD/[facility] Physician requesting consult: Dr. Jimmye Norman Reason for consult: Atrial fibrillation with RVR, acute on chronic diastolic and systolic CHF   Patient Profile:   Gerald [name] is a 41 y.o. male with a hx of morbid obesity, persistent atrial fibrillation, medication noncompliance, polysubstance abuse, systolic CHF, prior hospital admission March 2023 for atrial fibrillation with acute on chronic systolic CHF who presents with worsening shortness of breath over the past several days  History of Present Illness:   Gerald [name] reports having increasing shortness of breath over the past 4 days.  Medication noncompliance, out of his medications for unspecified period of time.  EKG in the emergency room showing atrial fibrillation with RVR rate 130 bpm. Initially started on diltiazem infusion for rate control, this was discontinued secondary to hypotension. He was transition to amiodarone bolus with infusion overnight. Received IV Lasix in the emergency room This morning heart rate 120 bpm on amiodarone 0.5 mg/min. Reports shortness of breath is mildly improved compared to yesterday Still feels he is having respiratory distress Saturations 88% on high flow nasal cannula   Past Medical History:  Diagnosis Date   Acute on chronic combined systolic and diastolic CHF (congestive heart failure) ([name])    Acute on chronic systolic CHF (congestive heart failure) ([place]) 15/40/0867   Acute systolic congestive heart failure ([name])    Atrial flutter, paroxysmal ([name])    CHF (congestive heart failure) ([place]) 03/30/2021   Chronic combined systolic and diastolic CHF (congestive heart failure) ([name])    Chronic kidney disease, stage III (moderate) ([name])    Dysrhythmia    brief  episode afib, cardioverted did not return   Hypertension    Hypertensive crisis 01/20/2021   Hypertensive emergency 12/21/2020   NSTEMI (non-ST elevated myocardial infarction) ([place]) 12/21/2015   Polysubstance abuse ([place])     Past Surgical History:  Procedure Laterality Date   KNEE SURGERY Right    LEFT HEART CATH AND CORONARY ANGIOGRAPHY N/A 12/21/2020   Procedure: LEFT HEART CATH AND CORONARY ANGIOGRAPHY;  Surgeon: Martinique, Peter M, MD;  Location: [facility];  Service: Cardiovascular;  Laterality: N/A;   TEE WITHOUT CARDIOVERSION N/A 04/04/2019   Procedure: TRANSESOPHAGEAL ECHOCARDIOGRAM (TEE);  Surgeon: [name], MD;  Location: [facility];  Service: Cardiovascular;  Laterality: N/A;     Home Medications:  Prior to Admission medications   Medication Sig Start Date End Date Taking? Authorizing Provider  amLODipine (NORVASC) 5 MG tablet Take 2 tablets (10 mg total) by mouth once daily. 11/05/21 02/03/22 Yes Enzo Bi, MD  atorvastatin (LIPITOR) 40 MG tablet Take 1 tablet (40 mg total) by mouth once daily. 11/05/21 02/03/22 Yes Enzo Bi, MD  carvedilol (COREG) 12.5 MG tablet Take 1 tablet (12.5 mg total) by mouth 2 (two) times daily with a meal. 11/05/21 02/03/22 Yes Enzo Bi, MD  cyclobenzaprine (FLEXERIL) 5 MG tablet Take 5 mg by mouth 3 (three) times daily as needed. 01/03/22  Yes [provider]  dapagliflozin propanediol (FARXIGA) 10 MG TABS tablet Take 1 tablet (10 mg total) by mouth once daily before breakfast. 11/05/21  Yes Enzo Bi, MD  FEROSUL 325 (65 Fe) MG tablet Take 325 mg by mouth daily. 01/03/22  Yes [provider]  hydrALAZINE (APRESOLINE) 100 MG tablet Take 1  tablet (100 mg total) by mouth 3 (three) times daily. Patient taking differently: Take 50 mg by mouth 3 (three) times daily. 11/05/21 02/03/22 Yes Enzo Bi, MD  lidocaine (LIDODERM) 5 % Place 1 patch onto the skin daily. Place 1 patch on the skin daily. Apply to affected area for 12 hours only each  day (then remove patch) 01/03/22 02/02/22 Yes [provider]  losartan (COZAAR) 25 MG tablet Take 1 tablet (25 mg total) by mouth once daily. 11/05/21 02/03/22 Yes Enzo Bi, MD  nicotine (NICODERM CQ - DOSED IN MG/24 HOURS) 21 mg/24hr patch Place 1 patch onto the skin daily. 01/03/22  Yes [provider]  nicotine polacrilex (NICORETTE) 4 MG gum Place 1 each inside cheek every hour as needed. Chew 1 piece (4 mg total) and park in cheek every hour as needed for smoking cessation. 01/03/2022 02/02/2022 [place] 01/03/2022 01/03/22 02/02/22 Yes [provider]  spironolactone (ALDACTONE) 50 MG tablet Take 50 mg by mouth daily. 01/03/22  Yes [provider]  torsemide (DEMADEX) 20 MG tablet Take 1 tablet (20 mg total) by mouth once daily. 11/05/21 02/03/22 Yes Enzo Bi, MD  isosorbide mononitrate (IMDUR) 60 MG 24 hr tablet Take 2 tablets (120 mg total) by mouth once daily. 11/05/21 12/05/21  Enzo Bi, MD  potassium chloride (KLOR-CON) 10 MEQ tablet Take 1 tablet (10 mEq total) by mouth once daily. Patient not taking: Reported on 02/04/2022 11/05/21 02/03/22  Enzo Bi, MD  rivaroxaban (XARELTO) 20 MG TABS tablet Take 1 tablet (20 mg total) by mouth once daily with supper. 11/05/21 02/03/22  Enzo Bi, MD  potassium chloride (KLOR-CON M) 10 MEQ tablet Take 1 tablet (10 mEq total) by mouth daily. 09/09/21 11/05/21  Dorethea Clan, DO    Inpatient Medications: Scheduled Meds:  amLODipine  10 mg Oral Daily   atorvastatin  40 mg Oral Daily   Chlorhexidine Gluconate Cloth  6 each Topical Q0600   furosemide  40 mg Intravenous Q12H   hydrALAZINE  50 mg Oral TID   isosorbide mononitrate  120 mg Oral Daily   lidocaine  1 patch Transdermal Q24H   Continuous Infusions:  amiodarone 60 mg/hr (02/13/2022 1118)   [START ON 01/31/2022] cefTRIAXone (ROCEPHIN)  IV     doxycycline (VIBRAMYCIN) IV Stopped (02/06/2022 0947)   heparin 1,350 Units/hr (02/09/2022 1000)   PRN Meds: acetaminophen,  guaiFENesin-dextromethorphan, morphine injection  Allergies:   No Known Allergies  Social History:   Social History   Socioeconomic History   Marital status: Single    Spouse name: Not on file   Number of children: 3   Years of education: Not on file   Highest education level: 11th grade  Occupational History   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   Sexual activity: Yes    Partners: Female    Birth control/protection: Condom  Other Topics  Concern   Not on file  Social History Narrative   Not on file   Social Determinants of Health   Financial Resource Strain: High Risk (07/08/2021)   Overall Financial Resource Strain (CARDIA)    Difficulty of Paying Living Expenses: Hard  Food Insecurity: No Food Insecurity (07/08/2021)   Hunger Vital Sign    Worried About Running Out of Food in the Last Year: Never true    Ran Out of Food in the Last Year: Never true  Transportation Needs: No Transportation Needs (07/08/2021)   PRAPARE - Hydrologist (Medical): No    Lack of Transportation (Non-Medical): No  Physical Activity: Not on file  Stress: Not on file  Social Connections: Not on file  Intimate Partner Violence: Not on file    Family History:    Family History  Problem Relation Age of Onset   Hypertension Father    Heart disease Paternal Grandfather      ROS:  Please see the history of present illness.  Review of Systems  Constitutional: Negative.   HENT: Negative.     Respiratory:  Positive for shortness of breath.   Cardiovascular:  Positive for leg swelling.  Gastrointestinal: Negative.   Musculoskeletal: Negative.   Neurological: Negative.   Psychiatric/Behavioral: Negative.    All other systems reviewed and are negative.    Physical Exam/Data:   Vitals:   02/11/2022 1000 01/23/2022 1100 01/29/2022 1136 01/31/2022 1200  BP: (!) 122/95 106/74  113/82  Pulse:   (!) 113   Resp: (!) 44 (!) 40 (!) 39 (!) 38  Temp:      TempSrc:      SpO2:   97%   Weight:      Height:        Intake/Output Summary (Last 24 hours) at 02/11/2022 1332 Last data filed at 01/18/2022 1000 Gross per 24 hour  Intake 644.9 ml  Output 600 ml  Net 44.9 ml      02/11/2022    3:00 AM 01/23/2022   12:16 AM 11/05/2021   11:44 AM  Last 3 Weights  Weight (lbs) 290 lb 308 lb 10.3 oz 308 lb 3.3 oz  Weight (kg) 131.543 kg 140 kg 139.8 kg     Body mass index is 39.33 kg/m.  General:  Well nourished, well developed, in no acute distress HEENT: normal Neck: no JVD Vascular: No carotid bruits; Distal pulses 2+ bilaterally Cardiac: Irregularly irregular, rapid no murmur  Lungs:  clear to auscultation bilaterally, no wheezing, rhonchi or rales  Abd: soft, nontender, no hepatomegaly  Ext: no edema Musculoskeletal:  No deformities, BUE and BLE strength normal and equal Skin: warm and dry  Neuro:  CNs 2-12 intact, no focal abnormalities noted Psych:  Normal affect   EKG:  The EKG was personally reviewed and demonstrates:   Atrial fibrillation with left bundle branch block Telemetry:  Telemetry was personally reviewed and demonstrates:   Atrial fibrillation rate 120 bpm  Relevant CV Studies: Echo  1. Severe global reduction in LV function; severe LVH; suggest cardiac  MRI to R/O infiltrative cardiomyopathy or HCM.   2. Left ventricular ejection fraction, by estimation, is 20 to 25%. The  left ventricle has severely decreased function. The left ventricle  demonstrates global  hypokinesis. The left ventricular internal cavity size  was moderately dilated. There is severe   left ventricular hypertrophy. Left ventricular diastolic parameters are  consistent with Grade III diastolic dysfunction (restrictive).   3. Right  ventricular systolic function is normal. The right ventricular  size is normal. There is mildly elevated pulmonary artery systolic  pressure.   4. Left atrial size was severely dilated.   5. A small pericardial effusion is present.   6. The mitral valve is normal in structure. Trivial mitral valve  regurgitation. No evidence of mitral stenosis.   7. The aortic valve is tricuspid. Aortic valve regurgitation is not  visualized. No aortic stenosis is present.   8. Aortic dilatation noted. There is mild dilatation of the aortic root,  measuring 40 mm. There is mild dilatation of the ascending aorta,  measuring 40 mm.   9. The inferior vena cava is normal in size with greater than 50%  respiratory variability, suggesting right atrial pressure of 3 mmHg.   Laboratory Data:  High Sensitivity Troponin:   Recent Labs  Lab 02/06/2022 0023 01/27/2022 0153 01/26/2022 0537 01/23/2022 0638  TROPONINIHS 553* 472* 500* 450*     Chemistry Recent Labs  Lab 01/17/2022 0023 02/08/2022 0638  NA 137 139  K 3.4* 3.6  CL 107 110  CO2 21* 20*  GLUCOSE 113* 132*  BUN 39* 35*  CREATININE 2.43* 2.22*  CALCIUM 9.3 8.8*  MG 2.3 2.1  GFRNONAA 33* 37*  ANIONGAP 9 9    Recent Labs  Lab 01/26/2022 0023 01/29/2022 0638  PROT 8.0 7.6  ALBUMIN 3.7 3.4*  AST 21 21  ALT 34 32  ALKPHOS 84 80  BILITOT 1.5* 1.5*   Lipids No results for input(s): "CHOL", "TRIG", "HDL", "LABVLDL", "LDLCALC", "CHOLHDL" in the last 168 hours.  Hematology Recent Labs  Lab 01/19/2022 0023 01/23/2022 0638  WBC 7.9 9.7  RBC 5.88* 5.54  HGB 12.0* 11.3*  HCT 40.5 37.2*  MCV 68.9* 67.1*  MCH 20.4* 20.4*  MCHC 29.6* 30.4  RDW 18.8* 18.1*  PLT 233 229   Thyroid  Recent Labs  Lab  02/09/2022 0638  TSH 0.920    BNP Recent Labs  Lab 01/26/2022 0023  BNP 793.1*    DDimer  Recent Labs  Lab 02/12/2022 9373  DDIMER 0.40     Radiology/Studies:  DG Chest Port 1 View  Result Date: 02/04/2022 CLINICAL DATA:  Shortness of breath EXAM: PORTABLE CHEST 1 VIEW COMPARISON:  Chest x-ray dated January 30, 2022 FINDINGS: Unchanged cardiomegaly. Increased mid and lower lung predominant heterogeneous opacities. No definite pleural effusion. No evidence of pneumothorax. IMPRESSION: Increased heterogeneous opacities of the right lung, concerning for worsening infection. Electronically Signed   By: Yetta Glassman M.D.   On: 01/17/2022 11:12   DG Chest Port 1 View  Result Date: 02/05/2022 CLINICAL DATA:  41 year old male with history of shortness of breath, pneumonia and hemoptysis. EXAM: PORTABLE CHEST 1 VIEW COMPARISON:  Chest x-ray 02/12/2022. FINDINGS: Extensive areas of ill-defined airspace consolidation noted throughout the periphery of the right lung, most evident in the right mid to lower lung. No pleural effusions. No pneumothorax. Cephalization of the pulmonary vasculature. No frank pulmonary edema. Heart size is moderately enlarged. Upper mediastinal contours are unremarkable. IMPRESSION: 1. Severe multilobar pneumonia in the right lung, similar to the recent prior study. 2. Cardiomegaly with pulmonary venous congestion, but no frank pulmonary edema. Electronically Signed   By: Vinnie Langton M.D.   On: 01/27/2022 05:14   DG Chest Portable 1 View  Result Date: 01/23/2022 CLINICAL DATA:  Shortness of breath EXAM: PORTABLE CHEST 1 VIEW COMPARISON:  11/02/2021 FINDINGS: Cardiomegaly. Patchy airspace disease throughout the right lung. No confluent opacity on the  left. No effusions or acute bony abnormality. IMPRESSION: Patchy opacities within the right lung concerning for pneumonia. Electronically Signed   By: Rolm Baptise M.D.   On: 01/24/2022 00:56     Assessment and Plan:    Atrial fibrillation with RVR Unable to tolerate diltiazem secondary to hypotension -Recommend we continue amiodarone infusion On heparin infusion Poor candidate for TEE cardioversion at this time given respiratory distress No room on blood pressure to add beta-blockers or calcium channel blockers May require intubation and further management at that time  2.  Acute respiratory distress In the setting of cardiomyopathy, atrial fibrillation with RVR Would give Lasix 60 IV x1 then 40 twice daily Pursue rate control as above May require intubation for worsening hypoxia.   3/. Polysubstance abuse Prior history of cocaine Complete cessation recommended   Total encounter time more than 80 minutes  Greater than 50% was spent in counseling and coordination of care with the patient   For questions or updates, please contact [place] Please consult [email or web address] for contact info under    Signed, Ida Rogue, MD  01/23/2022 1:32 PM

## 2022-01-30 NOTE — Plan of Care (Signed)
  Problem: Clinical Measurements: Goal: Will remain free from infection Outcome: Progressing   Problem: Safety: Goal: Ability to remain free from injury will improve Outcome: Progressing   Problem: Education: Goal: Ability to verbalize understanding of medication therapies will improve Outcome: Progressing   Problem: Activity: Goal: Capacity to carry out activities will improve Outcome: Progressing   Problem: Clinical Measurements: Goal: Ability to maintain clinical measurements within normal limits will improve Outcome: Not Progressing   Problem: Coping: Goal: Level of anxiety will decrease Outcome: Not Progressing

## 2022-01-30 NOTE — Progress Notes (Signed)
PROGRESS NOTE    Gerald [name]  ELF:[id number] DOB: 09-Aug-1981 DOA: 01/22/2022 PCP: Patient, [name]   Assessment & Plan:   Principal Problem:   Acute respiratory failure ([place]) Active Problems:   Atrial fibrillation with rapid ventricular response ([name])   Acute on chronic combined systolic and diastolic CHF (congestive heart failure) ([name])   Stage 3a chronic kidney disease (CKD) ([name]) - baseline SCr 1.8-2.0   Acute respiratory failure with hypoxemia ([name])  Assessment and Plan: Acute hypoxic respiratory failure: secondary to acute on chronic combined CHF exacerbation. Echo in January 2023 showing EF of 20 - 25%, grade III diastolic dysfunction. Continue on IV lasix. Monitor I/Os. Continue on supplemental oxygen and wean as tolerated  Acute on chronic combined CHF exacerbation: continue on IV lasix. Monitor I/Os. No ARB/ACE-I secondary to CKD. BNP is 793.1 but pt is obese. High risk for deterioration requiring intubation    A. fib: w/ RVR. Likely PAF. Continue on IV amio drip and wean as tolerated. Holding xarelto. Continue on IV heparin. Cardio consulted & recs apprec   Possibly multilobar pneumonia: continue on IV doxycycline, rocephin, bronchodilators. Continue on supplemental oxygen and wean as tolerated   HTN: continue on amlodipine, imdur, hydralazine. Holding coreg until cocaine washout   AKI on CKDIIIb: Cr baseline is around 2. Cr is trending down slightly from day prior   Polysubstance abuse: admits to taking cocaine. Illicit drug use cessation counseling when more awake & alert. Urine drug screen was neg   Elevated troponins: likely secondary to demand ischemia. Cardio consulted and recs apprec   Anemia  of chronic disease: likely secondary to CKD. No need for a transfusion currently   Obesity: BMI 39.3. Complicates overall care & prognosis     DVT prophylaxis: heparin  Code Status: full  Family Communication: Disposition Plan: unclear   Level of care:  Stepdown  Status is: Inpatient Remains inpatient appropriate because: severity of illness    Consultants:  Cardio  ICU   Procedures:   Antimicrobials: rocephin, doxycycline    Subjective: Pt is lethargic and short of breath   Objective: Vitals:   02/01/2022 0300 01/31/2022 0355 02/10/2022 0500 02/13/2022 0748  BP: (!) 145/113  (!) 124/96   Pulse: (!) 133     Resp: (!) 28  (!) 25   Temp: 100.2 F (37.9 C)   99.3 F (37.4 C)  TempSrc: Oral   Oral  SpO2: 98% 94%    Weight: 131.5 kg     Height: 6' (1.829 m)       Intake/Output Summary (Last 24 hours) at 02/06/2022 0831 Last data filed at 02/06/2022 0400 Gross per 24 hour  Intake 100 ml  Output 400 ml  Net -300 ml   Filed Weights   01/22/2022 0016 02/05/2022 0300  Weight: (!) 140 kg 131.5 kg    Examination:  General exam: Appears uncomfortable. Morbid obesity  Respiratory system: course breath sounds b/l Cardiovascular system: S1 & S2 +. No  rubs, gallops or clicks.  Gastrointestinal system: Abdomen is obese, soft and nontender. Hypoactive bowel sounds heard. Central nervous system: lethargic. Moves all extremities Psychiatry: Judgement and insight appears not at baseline. Flat mood and affect     Data Reviewed: I have personally reviewed following labs and imaging studies  CBC: Recent Labs  Lab 01/24/2022 0023 02/01/2022 0638  WBC 7.9 9.7  NEUTROABS 5.1 7.6  HGB 12.0* 11.3*  HCT 40.5 37.2*  MCV 68.9* 67.1*  PLT 233 229  Basic Metabolic Panel: Recent Labs  Lab 01/18/2022 0023 02/09/2022 0638  NA 137 139  K 3.4* 3.6  CL 107 110  CO2 21* 20*  GLUCOSE 113* 132*  BUN 39* 35*  CREATININE 2.43* 2.22*  CALCIUM 9.3 8.8*  MG 2.3 2.1   GFR: Estimated Creatinine Clearance: 61.4 mL/min (A) (by C-G formula based on SCr of 2.22 mg/dL (H)). Liver Function Tests: Recent Labs  Lab 01/29/2022 0023 01/20/2022 0638  AST 21 21  ALT 34 32  ALKPHOS 84 80  BILITOT 1.5* 1.5*  PROT 8.0 7.6  ALBUMIN 3.7 3.4*   No results  for input(s): "LIPASE", "AMYLASE" in the last 168 hours. No results for input(s): "AMMONIA" in the last 168 hours. Coagulation Profile: Recent Labs  Lab 02/02/2022 0028  INR 1.5*   Cardiac Enzymes: No results for input(s): "CKTOTAL", "CKMB", "CKMBINDEX", "TROPONINI" in the last 168 hours. BNP (last 3 results) No results for input(s): "PROBNP" in the last 8760 hours. HbA1C: No results for input(s): "HGBA1C" in the last 72 hours. CBG: Recent Labs  Lab 01/28/2022 0254  GLUCAP 125*   Lipid Profile: No results for input(s): "CHOL", "HDL", "LDLCALC", "TRIG", "CHOLHDL", "LDLDIRECT" in the last 72 hours. Thyroid Function Tests: Recent Labs    01/17/2022 0638  TSH 0.920   Anemia Panel: No results for input(s): "VITAMINB12", "FOLATE", "FERRITIN", "TIBC", "IRON", "RETICCTPCT" in the last 72 hours. Sepsis Labs: Recent Labs  Lab 01/27/2022 0117 01/19/2022 0153 02/05/2022 0537  PROCALCITON  --   --  0.19  LATICACIDVEN 1.2 1.0  --     Recent Results (from the past 240 hour(s))  Blood culture (routine x 2)     Status: None (Preliminary result)   Collection Time: 02/13/2022  1:17 AM   Specimen: BLOOD  Result Value Ref Range Status   Specimen Description BLOOD LEFT HAND  Final   Special Requests   Final    BOTTLES DRAWN AEROBIC AND ANAEROBIC Blood Culture adequate volume   Culture   Final    NO GROWTH < 12 HOURS Performed at [facility], [address], [place], [place] [address]    Report Status PENDING  Incomplete  Resp Panel by RT-PCR (Flu A&B, Covid) Anterior Nasal Swab     Status: None   Collection Time: 02/10/2022  1:17 AM   Specimen: Anterior Nasal Swab  Result Value Ref Range Status   SARS Coronavirus 2 by RT PCR NEGATIVE NEGATIVE Final    Comment: (NOTE) SARS-CoV-2 target nucleic acids are NOT DETECTED.  The SARS-CoV-2 RNA is generally detectable in upper respiratory specimens during the acute phase of infection. The lowest concentration of SARS-CoV-2 viral copies  this assay can detect is 138 copies/mL. A negative result does not preclude SARS-Cov-2 infection and should not be used as the sole basis for treatment or other patient management decisions. A negative result may occur with  improper specimen collection/handling, submission of specimen other than nasopharyngeal swab, presence of viral mutation(s) within the areas targeted by this assay, and inadequate number of viral copies(<138 copies/mL). A negative result must be combined with clinical observations, patient history, and epidemiological information. The expected result is Negative.  Fact Sheet for Patients:  EntrepreneurPulse.com.au  Fact Sheet for Healthcare Providers:  IncredibleEmployment.be  This test is no t yet approved or cleared by the [place] FDA and  has been authorized for detection and/or diagnosis of SARS-CoV-2 by FDA under an Emergency Use Authorization (EUA). This EUA will remain  in effect (meaning this test  can be used) for the duration of the COVID-19 declaration under Section 564(b)(1) of the Act, 21 U.S.C.section 360bbb-3(b)(1), unless the authorization is terminated  or revoked sooner.       Influenza A by PCR NEGATIVE NEGATIVE Final   Influenza B by PCR NEGATIVE NEGATIVE Final    Comment: (NOTE) The Xpert Xpress SARS-CoV-2/FLU/RSV plus assay is intended as an aid in the diagnosis of influenza from Nasopharyngeal swab specimens and should not be used as a sole basis for treatment. Nasal washings and aspirates are unacceptable for Xpert Xpress SARS-CoV-2/FLU/RSV testing.  Fact Sheet for Patients: EntrepreneurPulse.com.au  Fact Sheet for Healthcare Providers: IncredibleEmployment.be  This test is not yet approved or cleared by the [place] FDA and has been authorized for detection and/or diagnosis of SARS-CoV-2 by FDA under an Emergency Use Authorization (EUA). This EUA  will remain in effect (meaning this test can be used) for the duration of the COVID-19 declaration under Section 564(b)(1) of the Act, 21 U.S.C. section 360bbb-3(b)(1), unless the authorization is terminated or revoked.  Performed at [facility], [address] [place], [place] [address]   Blood culture (routine x 2)     Status: None (Preliminary result)   Collection Time: 01/31/2022  1:18 AM   Specimen: BLOOD  Result Value Ref Range Status   Specimen Description BLOOD LEFT ASSIST CONTROL  Final   Special Requests   Final    BOTTLES DRAWN AEROBIC AND ANAEROBIC Blood Culture adequate volume   Culture   Final    NO GROWTH < 12 HOURS Performed at [facility], [address] [place], [place] [address]    Report Status PENDING  Incomplete  MRSA Next Gen by PCR, Nasal     Status: None   Collection Time: 02/06/2022  3:01 AM   Specimen: Nasal Mucosa; Nasal Swab  Result Value Ref Range Status   MRSA by PCR Next Gen NOT DETECTED NOT DETECTED Final    Comment: (NOTE) The GeneXpert MRSA Assay (FDA approved for NASAL specimens only), is one component of a comprehensive MRSA colonization surveillance program. It is not intended to diagnose MRSA infection nor to guide or monitor treatment for MRSA infections. Test performance is not FDA approved in patients less than 77 years old. Performed at [facility], [place], [place] [address]   Expectorated Sputum Assessment w Gram Stain, Rflx to Resp Cult     Status: None   Collection Time: 01/22/2022  6:07 AM   Specimen: Expectorated Sputum  Result Value Ref Range Status   Specimen Description EXPECTORATED SPUTUM  Final   Special Requests NONE  Final   Sputum evaluation   Final    THIS SPECIMEN IS ACCEPTABLE FOR SPUTUM CULTURE Performed at [facility], [address] [place], [place] [address]    Report Status 01/29/2022 FINAL  Final         Radiology Studies: [facility] Chest Port 1  View  Result Date: 02/13/2022 CLINICAL DATA:  41 year old male with history of shortness of breath, pneumonia and hemoptysis. EXAM: PORTABLE CHEST 1 VIEW COMPARISON:  Chest x-ray 01/27/2022. FINDINGS: Extensive areas of ill-defined airspace consolidation noted throughout the periphery of the right lung, most evident in the right mid to lower lung. No pleural effusions. No pneumothorax. Cephalization of the pulmonary vasculature. No frank pulmonary edema. Heart size is moderately enlarged. Upper mediastinal contours are unremarkable. IMPRESSION: 1. Severe multilobar pneumonia in the right lung, similar to the recent prior study. 2. Cardiomegaly  with pulmonary venous congestion, but no frank pulmonary edema. Electronically Signed   By: Vinnie Langton M.D.   On: 01/25/2022 05:14   DG Chest Portable 1 View  Result Date: 02/09/2022 CLINICAL DATA:  Shortness of breath EXAM: PORTABLE CHEST 1 VIEW COMPARISON:  11/02/2021 FINDINGS: Cardiomegaly. Patchy airspace disease throughout the right lung. No confluent opacity on the left. No effusions or acute bony abnormality. IMPRESSION: Patchy opacities within the right lung concerning for pneumonia. Electronically Signed   By: Rolm Baptise M.D.   On: 02/09/2022 00:56        Scheduled Meds:  amLODipine  10 mg Oral Daily   atorvastatin  40 mg Oral Daily   Chlorhexidine Gluconate Cloth  6 each Topical Q0600   furosemide  40 mg Intravenous Q12H   hydrALAZINE  50 mg Oral TID   isosorbide mononitrate  120 mg Oral Daily   lidocaine  1 patch Transdermal Q24H   Continuous Infusions:  amiodarone 60 mg/hr (02/13/2022 0437)   Followed by   amiodarone     [START ON 01/31/2022] cefTRIAXone (ROCEPHIN)  IV     doxycycline (VIBRAMYCIN) IV 100 mg (02/10/2022 0747)   heparin 1,350 Units/hr (01/31/2022 0150)     LOS: 0 days    Time spent: 35 mins     Wyvonnia Dusky, MD [facility] Pager [phone]  If 7PM-7AM, please contact  night-coverage [email or web address] 02/06/2022, 8:31 AM

## 2022-01-30 NOTE — ED Notes (Signed)
Respiratory at bedside placing pt on high flow due to pt vomitting

## 2022-01-30 NOTE — Progress Notes (Signed)
Pts RR 46 at this time and o2 sats 88%. O2 increased to 70% and pt remains on HFNC. Md Jimmye Norman) notified and stated ICU will be consulted for high likelihood of intubation.

## 2022-01-30 NOTE — TOC Initial Note (Signed)
Transition of Care [facility]) - Initial/Assessment Note    Patient Details  Name: Gerald [name] MRN: [id number] Date of Birth: Apr 02, 1981  [facility] of [facility]) CM/SW Contact:    Alberteen Sam, LCSW Phone Number: 02/13/2022, 10:29 AM  Clinical Narrative:                  Patient known due to readmissions.   Patient from home in [place] and living with a friend per report.    Patient has hx of heart failure appointments lined up with Otila Kluver.    Patient previously received medications from med management at time of discharge.    Patient h[facility].   TOC will follow for discharge planning needs.   Expected Discharge Plan: Home/Self Care Barriers to Discharge: Continued Medical Work up   Patient Goals and CMS Choice Patient states their goals for this hospitalization and ongoing recovery are:: to go home CMS Medicare.gov Compare Post Acute Care list provided to:: Patient Choice offered to / list presented to : Patient  Expected Discharge Plan and Services Expected Discharge Plan: Home/Self Care       Living arrangements for the past 2 months: Single Family Home                                      Prior Living Arrangements/Services Living arrangements for the past 2 months: Single Family Home Lives with:: Friends                   Activities of Daily Living      Permission Sought/Granted                  Emotional Assessment              Admission diagnosis:  Acute respiratory failure ([place]) [J96.00] Acute respiratory failure with hypoxia ([place]) [J96.01] Atrial fibrillation with RVR ([place]) [I48.91] Community acquired pneumonia of right lung, unspecified part of lung [J18.9] Acute on chronic congestive heart failure, unspecified heart failure type ([place]) [I50.9] Acute respiratory failure with hypoxemia ([place]) [J96.01] Patient Active Problem List   Diagnosis Date Noted   Acute respiratory failure  ([place]) 02/13/2022   Acute respiratory failure with hypoxemia ([place]) 01/27/2022   Atrial fibrillation with rapid ventricular response ([place]) 11/02/2021   Severe uncontrolled hypertension 09/05/2021   Restless leg 07/08/2021   Cocaine abuse ([place]) 01/20/2021   Obesity, Class III, BMI 40-49.9 (morbid obesity) ([place]) 01/20/2021   Hypertensive emergency 12/21/2020   Stage 3a chronic kidney disease (CKD) ([place]) - baseline SCr 1.8-2.0 12/21/2020   PAF (paroxysmal atrial fibrillation) ([place]) 07/05/2020   Elevated troponin 07/05/2020   Microcytic anemia 07/05/2020   Hypokalemia 07/05/2020   Acute on chronic combined systolic and diastolic CHF (congestive heart failure) ([place])    Atrial flutter ([place])    Essential hypertension    Acute respiratory distress 04/01/2019   PCP:  Patient, [name] Pharmacy:   [place] [address] Phone: [phone] Fax: [phone]     Social Determinants of Health (SDOH) Interventions    Readmission Risk Interventions    09/09/2021    1:33 PM 06/03/2021    1:55 PM 04/02/2021    1:25 PM  Readmission Risk Prevention Plan  Transportation Screening Complete Complete Complete  Medication Review Press photographer) Complete Complete Complete  PCP or Specialist appointment within 3-5 days of discharge Complete Complete Complete  HRI or Home Care Consult Complete Complete Complete  SW Recovery Care/Counseling Consult Complete Complete Complete  Palliative Care Screening Not Applicable Not Applicable Not Applicable  [place] Not Applicable Not Applicable Not Applicable

## 2022-01-30 NOTE — ED Notes (Signed)
Pt beginning to vomit. Dr. Alfred Levins at bedside. Verbal order of zofran to be given. Respiratory called to bedside to change pt from bipap to high flow nasal cannula.

## 2022-01-30 NOTE — H&P (Addendum)
History and Physical    Gerald [name] HQI:[id number] DOB: [birth year] DOA: 02/03/2022  PCP: Patient, [name]  Patient coming from: Home.  Chief Complaint: Shortness of breath.  HPI: Gerald [name] is a 41 y.o. male with history of chronic systolic and diastolic CHF last EF measured was 20 to 25% in January 2023 with history of hypertension, chronic kidney disease stage III baseline creatinine around 2, anemia polysubstance abuse admits to taking cocaine presents to the ER because of worsening shortness of breath for the last 3 days with no associated chest pain has some productive cough.  Patient states he has not taken his medicines for last 3 days.  ED Course: In the ER patient was hypoxic initially requiring BiPAP but after placing on BiPAP patient had vomiting and had to be taken off the BiPAP was placed on high flow oxygen.  Patient chest x-ray shows congestion was placed on IV Lasix and also empirically on antibiotics.  BNP 790 high sensitive troponin was 553 and 4 and 72 was started on IV heparin.  Patient admitted for acute hypoxic respiratory failure likely from CHF.  Since patient was in A-fib with RVR initially was started on Cardizem infusion.  Transition to amiodarone.  Review of Systems: As per HPI, rest all negative.   Past Medical History:  Diagnosis Date   Acute on chronic combined systolic and diastolic CHF (congestive heart failure) ([name])    Acute on chronic systolic CHF (congestive heart failure) ([place]) 13/24/4010   Acute systolic congestive heart failure ([name])    Atrial flutter, paroxysmal ([name])    CHF (congestive heart failure) ([place]) 03/30/2021   Chronic combined systolic and diastolic CHF (congestive heart failure) ([name])    Chronic kidney disease, stage III (moderate) ([name])    Dysrhythmia    brief episode afib, cardioverted did not return   Hypertension    Hypertensive crisis 01/20/2021   Hypertensive emergency 12/21/2020   NSTEMI (non-ST elevated myocardial  infarction) ([place]) 12/21/2015   Polysubstance abuse ([place])     Past Surgical History:  Procedure Laterality Date   KNEE SURGERY Right    LEFT HEART CATH AND CORONARY ANGIOGRAPHY N/A 12/21/2020   Procedure: LEFT HEART CATH AND CORONARY ANGIOGRAPHY;  Surgeon: Martinique, Peter M, MD;  Location: [facility];  Service: Cardiovascular;  Laterality: N/A;   TEE WITHOUT CARDIOVERSION N/A 04/04/2019   Procedure: TRANSESOPHAGEAL ECHOCARDIOGRAM (TEE);  Surgeon: [name], MD;  Location: [facility];  Service: Cardiovascular;  Laterality: N/A;     reports that he has been smoking cigarettes. He has a 21.00 pack-year smoking history. He has never used smokeless tobacco. He reports current alcohol use of about 6.0 standard drinks of alcohol per week. He reports current drug use. Drugs: Cocaine and Marijuana.  No Known Allergies  Family History  Problem Relation Age of Onset   Hypertension Father    Heart disease Paternal Grandfather     Prior to Admission medications   Medication Sig Start Date End Date Taking? Authorizing Provider  amLODipine (NORVASC) 5 MG tablet Take 2 tablets (10 mg total) by mouth once daily. 11/05/21 02/03/22 Yes Enzo Bi, MD  atorvastatin (LIPITOR) 40 MG tablet Take 1 tablet (40 mg total) by mouth once daily. 11/05/21 02/03/22 Yes Enzo Bi, MD  carvedilol (COREG) 12.5 MG tablet Take 1 tablet (12.5 mg total) by mouth 2 (two) times daily with a meal. 11/05/21 02/03/22 Yes Enzo Bi, MD  cyclobenzaprine (FLEXERIL) 5 MG tablet Take 5  mg by mouth 3 (three) times daily as needed. 01/03/22  Yes [provider]  dapagliflozin propanediol (FARXIGA) 10 MG TABS tablet Take 1 tablet (10 mg total) by mouth once daily before breakfast. 11/05/21  Yes Enzo Bi, MD  FEROSUL 325 (65 Fe) MG tablet Take 325 mg by mouth daily. 01/03/22  Yes [provider]  hydrALAZINE (APRESOLINE) 100 MG tablet Take 1 tablet (100 mg total) by mouth 3 (three) times daily. Patient taking differently:  Take 50 mg by mouth 3 (three) times daily. 11/05/21 02/03/22 Yes Enzo Bi, MD  lidocaine (LIDODERM) 5 % Place 1 patch onto the skin daily. Place 1 patch on the skin daily. Apply to affected area for 12 hours only each day (then remove patch) 01/03/22 02/02/22 Yes [provider]  losartan (COZAAR) 25 MG tablet Take 1 tablet (25 mg total) by mouth once daily. 11/05/21 02/03/22 Yes Enzo Bi, MD  nicotine (NICODERM CQ - DOSED IN MG/24 HOURS) 21 mg/24hr patch Place 1 patch onto the skin daily. 01/03/22  Yes [provider]  nicotine polacrilex (NICORETTE) 4 MG gum Place 1 each inside cheek every hour as needed. Chew 1 piece (4 mg total) and park in cheek every hour as needed for smoking cessation. 01/03/2022 02/02/2022 [place] 01/03/2022 01/03/22 02/02/22 Yes [provider]  spironolactone (ALDACTONE) 50 MG tablet Take 50 mg by mouth daily. 01/03/22  Yes [provider]  torsemide (DEMADEX) 20 MG tablet Take 1 tablet (20 mg total) by mouth once daily. 11/05/21 02/03/22 Yes Enzo Bi, MD  isosorbide mononitrate (IMDUR) 60 MG 24 hr tablet Take 2 tablets (120 mg total) by mouth once daily. 11/05/21 12/05/21  Enzo Bi, MD  potassium chloride (KLOR-CON) 10 MEQ tablet Take 1 tablet (10 mEq total) by mouth once daily. Patient not taking: Reported on 02/12/2022 11/05/21 02/03/22  Enzo Bi, MD  rivaroxaban (XARELTO) 20 MG TABS tablet Take 1 tablet (20 mg total) by mouth once daily with supper. 11/05/21 02/03/22  Enzo Bi, MD  potassium chloride (KLOR-CON M) 10 MEQ tablet Take 1 tablet (10 mEq total) by mouth daily. 09/09/21 11/05/21  Dorethea Clan, DO    Physical Exam: Constitutional: Moderately built and nourished.  Blood pressure is 130/70 pulse is 130/min.  Temperature 99 F. Vitals:   01/18/2022 0131 02/11/2022 0253 01/26/2022 0300 01/25/2022 0355  BP:   (!) 145/113   Pulse: (!) 126 (!) 113 (!) 133   Resp:  14 (!) 28   Temp:   100.2 F (37.9 C)   TempSrc:   Oral   SpO2: 100% 98%  98% 94%  Weight:   131.5 kg   Height:   6' (1.829 m)    Eyes: Anicteric no pallor. ENMT: No discharge from the ears eyes nose and mouth. Neck: JVD elevated no mass felt. Respiratory: No rhonchi or crepitations. Cardiovascular: S1-S2 heard. Abdomen: Soft nontender bowel sound present. Musculoskeletal: No edema. Skin: No rash. Neurologic: Alert awake oriented time place and person.  Moves all extremities. Psychiatric: Appears normal.  Normal affect.   Labs on Admission: I have personally reviewed following labs and imaging studies  CBC: Recent Labs  Lab 02/13/2022 0023  WBC 7.9  NEUTROABS 5.1  HGB 12.0*  HCT 40.5  MCV 68.9*  PLT 811   Basic Metabolic Panel: Recent Labs  Lab 02/06/2022 0023  NA 137  K 3.4*  CL 107  CO2 21*  GLUCOSE 113*  BUN 39*  CREATININE 2.43*  CALCIUM 9.3  MG  2.3   GFR: Estimated Creatinine Clearance: 56.1 mL/min (A) (by C-G formula based on SCr of 2.43 mg/dL (H)). Liver Function Tests: Recent Labs  Lab 01/25/2022 0023  AST 21  ALT 34  ALKPHOS 84  BILITOT 1.5*  PROT 8.0  ALBUMIN 3.7   No results for input(s): "LIPASE", "AMYLASE" in the last 168 hours. No results for input(s): "AMMONIA" in the last 168 hours. Coagulation Profile: Recent Labs  Lab 02/13/2022 0028  INR 1.5*   Cardiac Enzymes: No results for input(s): "CKTOTAL", "CKMB", "CKMBINDEX", "TROPONINI" in the last 168 hours. BNP (last 3 results) No results for input(s): "PROBNP" in the last 8760 hours. HbA1C: No results for input(s): "HGBA1C" in the last 72 hours. CBG: Recent Labs  Lab 01/27/2022 0254  GLUCAP 125*   Lipid Profile: No results for input(s): "CHOL", "HDL", "LDLCALC", "TRIG", "CHOLHDL", "LDLDIRECT" in the last 72 hours. Thyroid Function Tests: No results for input(s): "TSH", "T4TOTAL", "FREET4", "T3FREE", "THYROIDAB" in the last 72 hours. Anemia Panel: No results for input(s): "VITAMINB12", "FOLATE", "FERRITIN", "TIBC", "IRON", "RETICCTPCT" in the last 72  hours. Urine analysis: No results found for: "COLORURINE", "APPEARANCEUR", "LABSPEC", "PHURINE", "GLUCOSEU", "HGBUR", "BILIRUBINUR", "KETONESUR", "PROTEINUR", "UROBILINOGEN", "NITRITE", "LEUKOCYTESUR" Sepsis Labs: @LABRCNTIP (procalcitonin:4,lacticidven:4) ) Recent Results (from the past 240 hour(s))  Resp Panel by RT-PCR (Flu A&B, Covid) Anterior Nasal Swab     Status: None   Collection Time: 01/26/2022  1:17 AM   Specimen: Anterior Nasal Swab  Result Value Ref Range Status   SARS Coronavirus 2 by RT PCR NEGATIVE NEGATIVE Final    Comment: (NOTE) SARS-CoV-2 target nucleic acids are NOT DETECTED.  The SARS-CoV-2 RNA is generally detectable in upper respiratory specimens during the acute phase of infection. The lowest concentration of SARS-CoV-2 viral copies this assay can detect is 138 copies/mL. A negative result does not preclude SARS-Cov-2 infection and should not be used as the sole basis for treatment or other patient management decisions. A negative result may occur with  improper specimen collection/handling, submission of specimen other than nasopharyngeal swab, presence of viral mutation(s) within the areas targeted by this assay, and inadequate number of viral copies(<138 copies/mL). A negative result must be combined with clinical observations, patient history, and epidemiological information. The expected result is Negative.  Fact Sheet for Patients:  EntrepreneurPulse.com.au  Fact Sheet for Healthcare Providers:  IncredibleEmployment.be  This test is no t yet approved or cleared by the [place] FDA and  has been authorized for detection and/or diagnosis of SARS-CoV-2 by FDA under an Emergency Use Authorization (EUA). This EUA will remain  in effect (meaning this test can be used) for the duration of the COVID-19 declaration under Section 564(b)(1) of the Act, 21 U.S.C.section 360bbb-3(b)(1), unless the authorization is  terminated  or revoked sooner.       Influenza A by PCR NEGATIVE NEGATIVE Final   Influenza B by PCR NEGATIVE NEGATIVE Final    Comment: (NOTE) The Xpert Xpress SARS-CoV-2/FLU/RSV plus assay is intended as an aid in the diagnosis of influenza from Nasopharyngeal swab specimens and should not be used as a sole basis for treatment. Nasal washings and aspirates are unacceptable for Xpert Xpress SARS-CoV-2/FLU/RSV testing.  Fact Sheet for Patients: EntrepreneurPulse.com.au  Fact Sheet for Healthcare Providers: IncredibleEmployment.be  This test is not yet approved or cleared by the [place] FDA and has been authorized for detection and/or diagnosis of SARS-CoV-2 by FDA under an Emergency Use Authorization (EUA). This EUA will remain in effect (meaning this test can be used) for  the duration of the COVID-19 declaration under Section 564(b)(1) of the Act, 21 U.S.C. section 360bbb-3(b)(1), unless the authorization is terminated or revoked.  Performed at [facility], [address] [place], [place] [address]   MRSA Next Gen by PCR, Nasal     Status: None   Collection Time: 01/28/2022  3:01 AM   Specimen: Nasal Mucosa; Nasal Swab  Result Value Ref Range Status   MRSA by PCR Next Gen NOT DETECTED NOT DETECTED Final    Comment: (NOTE) The GeneXpert MRSA Assay (FDA approved for NASAL specimens only), is one component of a comprehensive MRSA colonization surveillance program. It is not intended to diagnose MRSA infection nor to guide or monitor treatment for MRSA infections. Test performance is not FDA approved in patients less than 14 years old. Performed at [facility], [address] [place], [place] [address]      Radiological Exams on Admission: DG Chest Portable 1 View  Result Date: 02/13/2022 CLINICAL DATA:  Shortness of breath EXAM: PORTABLE CHEST 1 VIEW COMPARISON:  11/02/2021 FINDINGS: Cardiomegaly. Patchy  airspace disease throughout the right lung. No confluent opacity on the left. No effusions or acute bony abnormality. IMPRESSION: Patchy opacities within the right lung concerning for pneumonia. Electronically Signed   By: Rolm Baptise M.D.   On: 01/19/2022 00:56    EKG: Independently reviewed.  A-fib with RVR.  Assessment/Plan Principal Problem:   Acute respiratory failure ([name]) Active Problems:   Atrial fibrillation with rapid ventricular response ([name])   Acute on chronic combined systolic and diastolic CHF (congestive heart failure) ([name])   Stage 3a chronic kidney disease (CKD) ([name]) - baseline SCr 1.8-2.0   Acute respiratory failure with hypoxemia ([name])    Acute respiratory failure secondary to acute on chronic systolic and diastolic CHF last EF measured was in January 2023 showing EF of 20 to 25% with grade 3 diastolic dysfunction presently on Lasix 40 mg IV every 12 closely follow intake output metabolic panel daily weights.  Not placing patient on ARB or ACE inhibitors due to worsening renal function.  Patient was started on antibiotics also for possible pneumonia which will be discontinued if procalcitonin is negative. Atrial fibrillation with RVR presently on amiodarone infusion.  Check TSH.  Patient used to take Xarelto but has not taken for last few days and since troponin was elevated was started on heparin. Hypertension we will restart patient's amlodipine and hydralazine and Imdur.  We will hold Coreg until cocaine washout. Chronic kidney disease stage III creatinine at baseline is around 2.  It is around 2.4 now.  We will closely monitor intake output UA and metabolic panel. Polysubstance abuse admits to taking cocaine.  Advised about quitting. Elevated troponin likely from CHF.  Denies any chest pain.  Will trend cardiac markers.  Presently on heparin and amiodarone. One episode of nausea vomiting.  Abdomen appears benign LFTs are normal.  We will continue to observe.  Likely  related to coughing spells. Anemia likely from renal disease appears to be chronic follow CBC.  Since patient has acute respiratory failure presently on 16 L high flow oxygen will need close monitoring and inpatient status.   DVT prophylaxis: Heparin infusion. Code Status: Full code. Family Communication: Discussed with patient. Disposition Plan: Home. Consults called: We will need to consult cardiology. Admission status: Inpatient.   Rise Patience MD [facility] Pager [phone].  If 7PM-7AM, please contact night-coverage [email or web address] Password [facility]  02/06/2022, 4:33 AM

## 2022-01-30 NOTE — Progress Notes (Signed)
[facility] for heparin infusion Indication: ACS/STEMI  Patient Measurements: Height: 6' (182.9 cm) Weight: 131.5 kg (290 lb) IBW/kg (Calculated) : 77.6 Heparin Dosing Weight: 109.9 kg  Labs: Recent Labs    02/03/2022 0023 02/13/2022 0028 01/21/2022 0153 01/18/2022 0537 01/27/2022 0638  HGB 12.0*  --   --   --  11.3*  HCT 40.5  --   --   --  37.2*  PLT 233  --   --   --  229  APTT  --  50*  --   --  70*  LABPROT  --  17.8*  --   --   --   INR  --  1.5*  --   --   --   HEPARINUNFRC  --   --  >1.10*  --   --   CREATININE 2.43*  --   --   --  2.22*  TROPONINIHS 553*  --  472* 500* 450*     Estimated Creatinine Clearance: 61.4 mL/min (A) (by C-G formula based on SCr of 2.22 mg/dL (H)).   Medical History: Past Medical History:  Diagnosis Date   Acute on chronic combined systolic and diastolic CHF (congestive heart failure) ([name])    Acute on chronic systolic CHF (congestive heart failure) ([place]) 55/37/4827   Acute systolic congestive heart failure ([name])    Atrial flutter, paroxysmal ([name])    CHF (congestive heart failure) ([place]) 03/30/2021   Chronic combined systolic and diastolic CHF (congestive heart failure) ([name])    Chronic kidney disease, stage III (moderate) ([name])    Dysrhythmia    brief episode afib, cardioverted did not return   Hypertension    Hypertensive crisis 01/20/2021   Hypertensive emergency 12/21/2020   NSTEMI (non-ST elevated myocardial infarction) ([place]) 12/21/2015   Polysubstance abuse ([place])     Medications:  PTA meds include Xarelto 20 mg daily.  Last dose unknown.  Checking baseline HL, aPTT, & INR  Assessment: Pt is 41 yo male presenting to ED c/o SOB, found w/ elevated BNP and Troponin I lvls.  Goal of Therapy:  aPTT goal: 66-102 Heparin level 0.3-0.7 units/ml Monitor platelets by anticoagulation protocol: Yes   Plan:  aPTT therapeutic x 1 Continue heparin infusion at 1350 units/hr BL HL > 1.1, will follow aPTT until  correlates w/ HL Recheck aPTT in 6 hrs HL & CBC daily while on heparin  Benita Gutter  02/12/2022 8:19 AM

## 2022-01-30 NOTE — Progress Notes (Signed)
ANTICOAGULATION CONSULT NOTE   Pharmacy Consult for Heparin Infusion Indication: ACS/STEMI  Patient Measurements: Height: 6' (182.9 cm) Weight: 131.5 kg (290 lb) IBW/kg (Calculated) : 77.6 Heparin Dosing Weight: 109.9 kg  Labs: Recent Labs    01/28/2022 0023 01/21/2022 0023 02/13/2022 0028 02/03/2022 0153 01/28/2022 0537 02/09/2022 2025 02/10/2022 1343 01/31/2022 2201  HGB 12.0*  --   --   --   --  11.3*  --   --   HCT 40.5  --   --   --   --  37.2*  --   --   PLT 233  --   --   --   --  229  --   --   APTT  --    < > 50*  --   --  70* 59* 60*  LABPROT  --   --  17.8*  --   --   --   --   --   INR  --   --  1.5*  --   --   --   --   --   HEPARINUNFRC  --   --   --  >1.10*  --   --   --   --   CREATININE 2.43*  --   --   --   --  2.22*  --   --   TROPONINIHS 553*  --   --  472* 500* 450*  --   --    < > = values in this interval not displayed.     Estimated Creatinine Clearance: 61.4 mL/min (A) (by C-G formula based on SCr of 2.22 mg/dL (H)).   Medical History: Past Medical History:  Diagnosis Date   Acute on chronic combined systolic and diastolic CHF (congestive heart failure) ([name])    Acute on chronic systolic CHF (congestive heart failure) ([place]) 42/70/6237   Acute systolic congestive heart failure ([name])    Atrial flutter, paroxysmal ([name])    CHF (congestive heart failure) ([place]) 03/30/2021   Chronic combined systolic and diastolic CHF (congestive heart failure) ([name])    Chronic kidney disease, stage III (moderate) ([name])    Dysrhythmia    brief episode afib, cardioverted did not return   Hypertension    Hypertensive crisis 01/20/2021   Hypertensive emergency 12/21/2020   NSTEMI (non-ST elevated myocardial infarction) ([place]) 12/21/2015   Polysubstance abuse ([place])     Medications:  PTA meds include Xarelto 20 mg daily.  Last dose unknown.  Checking baseline HL, aPTT, & INR  Assessment: Pt is 41 yo male presenting to ED c/o SOB, found w/ elevated BNP and Troponin I lvls.  Goal of  Therapy:  aPTT goal: 66-102 Heparin level 0.3-0.7 units/ml Monitor platelets by anticoagulation protocol: Yes   Plan:  aPTT subtherapeutic Heparin 1500 unit IV bolus Increase heparin infusion to 1700 units/hr BL HL > 1.1, will follow aPTT until correlates w/ HL Recheck aPTT w/ AM labs HL & CBC daily while on heparin  Renda Rolls, PharmD, [facility] 01/18/2022 10:50 PM

## 2022-01-30 NOTE — ED Provider Notes (Addendum)
[facility] Provider Note    Event Date/Time   First MD Initiated Contact with Patient 01/31/2022 0017     (approximate)   History   Shortness of Breath   HPI  Gerald [name] is a 41 y.o. male with a history of CHF the EF of 20 to 25%, a flutter, atrial fibrillation, chronic kidney disease, hypertension, polysubstance abuse (cannabinoids, cocaine, opiates), CAD who presents for evaluation of shortness of breath.  Patient has had 3 days of progressively worsening shortness of breath and cough productive of clear phlegm.  Has had body aches.  Has run out of his medications for the last 4 days.  Denies any chest pain, vomiting or diarrhea.  No oxygen requirement at home.     Past Medical History:  Diagnosis Date   Acute on chronic combined systolic and diastolic CHF (congestive heart failure) ([name])    Acute on chronic systolic CHF (congestive heart failure) ([place]) 76/19/5093   Acute systolic congestive heart failure ([name])    Atrial flutter, paroxysmal ([name])    CHF (congestive heart failure) ([place]) 03/30/2021   Chronic combined systolic and diastolic CHF (congestive heart failure) ([name])    Chronic kidney disease, stage III (moderate) ([name])    Dysrhythmia    brief episode afib, cardioverted did not return   Hypertension    Hypertensive crisis 01/20/2021   Hypertensive emergency 12/21/2020   NSTEMI (non-ST elevated myocardial infarction) ([place]) 12/21/2015   Polysubstance abuse ([place])     Past Surgical History:  Procedure Laterality Date   KNEE SURGERY Right    LEFT HEART CATH AND CORONARY ANGIOGRAPHY N/A 12/21/2020   Procedure: LEFT HEART CATH AND CORONARY ANGIOGRAPHY;  Surgeon: Martinique, Peter M, MD;  Location: [place] [facility];  Service: Cardiovascular;  Laterality: N/A;   TEE WITHOUT CARDIOVERSION N/A 04/04/2019   Procedure: TRANSESOPHAGEAL ECHOCARDIOGRAM (TEE);  Surgeon: [name], MD;  Location: [facility] OR[facility];  Service: Cardiovascular;  Laterality: N/A;      Physical Exam   Triage Vital Signs: ED Triage Vitals [02/05/2022 0016]  Enc Vitals Group     BP (!) 122/10     Pulse Rate (!) 132     Resp 20     Temp 98.8 F (37.1 C)     Temp Source Oral     SpO2 95 %     Weight (!) 308 lb 10.3 oz (140 kg)     Height 6' (1.829 m)     Head Circumference      Peak Flow      Pain Score 8     Pain Loc      Pain Edu?      Excl. in [place]?     Most recent vital signs: Vitals:   01/25/2022 0047 01/25/2022 0131  BP:    Pulse:  (!) 126  Resp:    Temp:    SpO2: 95% 100%     Constitutional: Alert and oriented.  Mild respiratory distress HEENT:      Head: Normocephalic and atraumatic.         Eyes: Conjunctivae are normal. Sclera is non-icteric.       Mouth/Throat: Mucous membranes are moist.       Neck: Supple with no signs of meningismus. Cardiovascular: Irregularly irregular rhythm with tachycardic rate  respiratory: Mild increased work of breathing with tachypnea, initially not hypoxic but patient then went down to 87% and was put on 2 L of oxygen, crackles bilaterally Gastrointestinal: Soft,  non tender, and non distended with positive bowel sounds. No rebound or guarding. Genitourinary: No CVA tenderness. Musculoskeletal: 3+ pitting edema bilaterally neurologic: Normal speech and language. Face is symmetric. Moving all extremities. No gross focal neurologic deficits are appreciated. Skin: Skin is warm, dry and intact. No rash noted. Psychiatric: Mood and affect are normal. Speech and behavior are normal.  ED Results / Procedures / Treatments   Labs (all labs ordered are listed, but only abnormal results are displayed) Labs Reviewed  CBC WITH DIFFERENTIAL/PLATELET - Abnormal; Notable for the following components:      Result Value   RBC 5.88 (*)    Hemoglobin 12.0 (*)    MCV 68.9 (*)    MCH 20.4 (*)    MCHC 29.6 (*)    RDW 18.8 (*)    All other components within normal limits  COMPREHENSIVE METABOLIC PANEL - Abnormal; Notable for  the following components:   Potassium 3.4 (*)    CO2 21 (*)    Glucose, Bld 113 (*)    BUN 39 (*)    Creatinine, Ser 2.43 (*)    Total Bilirubin 1.5 (*)    GFR, Estimated 33 (*)    All other components within normal limits  BRAIN NATRIURETIC PEPTIDE - Abnormal; Notable for the following components:   B Natriuretic Peptide 793.1 (*)    All other components within normal limits  APTT - Abnormal; Notable for the following components:   aPTT 50 (*)    All other components within normal limits  PROTIME-INR - Abnormal; Notable for the following components:   Prothrombin Time 17.8 (*)    INR 1.5 (*)    All other components within normal limits  TROPONIN I (HIGH SENSITIVITY) - Abnormal; Notable for the following components:   Troponin I (High Sensitivity) 553 (*)    All other components within normal limits  CULTURE, BLOOD (ROUTINE X 2)  CULTURE, BLOOD (ROUTINE X 2)  RESP PANEL BY RT-PCR (FLU A&B, COVID) ARPGX2  MAGNESIUM  LACTIC ACID, PLASMA  LACTIC ACID, PLASMA  URINE DRUG SCREEN, QUALITATIVE ([facility] ONLY)  HEPARIN LEVEL (UNFRACTIONATED)  TROPONIN I (HIGH SENSITIVITY)     EKG  ED ECG REPORT I, Rudene Re, the attending physician, personally viewed and interpreted this ECG.  Atrial fibrillation with a left bundle branch block, rate of 118.  Unchanged when compared to prior.   RADIOLOGY I, Rudene Re, attending MD, have personally viewed and interpreted the images obtained during this visit as below:  Chest x-ray is concerning for patchy opacities on the right   ___________________________________________________ Interpretation by Radiologist:  DG Chest Portable 1 View  Result Date: 01/27/2022 CLINICAL DATA:  Shortness of breath EXAM: PORTABLE CHEST 1 VIEW COMPARISON:  11/02/2021 FINDINGS: Cardiomegaly. Patchy airspace disease throughout the right lung. No confluent opacity on the left. No effusions or acute bony abnormality. IMPRESSION: Patchy opacities within  the right lung concerning for pneumonia. Electronically Signed   By: Rolm Baptise M.D.   On: 02/08/2022 00:56       PROCEDURES:  Critical Care performed: Yes, see critical care procedure note(s)  .Critical Care  Performed by: Rudene Re, MD Authorized by: Rudene Re, MD   Critical care provider statement:    Critical care time (minutes):  45   Critical care time was exclusive of:  Separately billable procedures and treating other patients   Critical care was necessary to treat or prevent imminent or life-threatening deterioration of the following conditions:  Respiratory failure, circulatory failure,  cardiac failure, CNS failure or compromise, sepsis and shock   Critical care was time spent personally by me on the following activities:  Development of treatment plan with patient or surrogate, discussions with consultants, evaluation of patient's response to treatment, examination of patient, ordering and review of laboratory studies, ordering and review of radiographic studies, ordering and performing treatments and interventions, pulse oximetry, re-evaluation of patient's condition and review of old charts   I assumed direction of critical care for this patient from another provider in my specialty: no     Care discussed with: admitting provider       IMPRESSION / MDM / [place] / ED COURSE  I reviewed the triage vital signs and the nursing notes.   41 y.o. male with a history of CHF the EF of 20 to 25%, a flutter, atrial fibrillation, chronic kidney disease, hypertension, polysubstance abuse (cannabinoids, cocaine, opiates), CAD who presents for evaluation of shortness of breath in the setting of 3 days of cough, body aches, and also noncompliance with his medications.  On arrival to the emergency room patient is in A-fib with RVR with mildly increased work of breathing, hypoxic to the upper 80s, looks grossly volume overloaded.  Patient was given 2 L of oxygen  with improvement of his respiratory status and 40 mg of IV Lasix.  On reevaluation patient's breathing started to get worse therefore patient was transition into BiPAP.  Chest x-ray shows asymmetric patchy infiltrate on the right concerning for possible pneumonia.  Patient will be covered with Rocephin and azithromycin.  We will hold off on fluids at this time due to history of EF of 20% and the fact the patient is grossly volume overloaded.  He is currently in A-fib with RVR.  Was given a dose of IV Cardizem, will start patient on a drip.  Initial troponin is elevated at 553 most likely demand ischemia in the setting of infection and volume overload and hypoxia.  We will start patient on heparin.  Mild hypokalemia with a K of 3.4 which I will supplemented IV to try to get his A-fib to be better controlled.  Hospitalist service was consulted and after discussion accepted patient to their service.  _________________________ 2:33 AM on 02/01/2022 ----------------------------------------- Patient became extremely nauseous and started vomiting therefore BiPAP was removed and patient was transitioned to high flow nasal cannula.  Hospitalist service was updated   MEDICATIONS GIVEN IN ED: Medications  azithromycin (ZITHROMAX) 500 mg in sodium chloride 0.9 % 250 mL IVPB (has no administration in time range)  diltiazem (CARDIZEM) 125 mg in dextrose 5% 125 mL (1 mg/mL) infusion (7.5 mg/hr Intravenous Rate/Dose Change 01/26/2022 0201)  potassium chloride 10 mEq in 100 mL IVPB (10 mEq Intravenous New Bag/Given 02/03/2022 0201)  heparin ADULT infusion 100 units/mL (25000 units/270mL) (1,350 Units/hr Intravenous New Bag/Given 02/05/2022 0150)  furosemide (LASIX) injection 40 mg (40 mg Intravenous Given 01/21/2022 0035)  diltiazem (CARDIZEM) injection 10 mg (10 mg Intravenous Given 01/29/2022 0041)  cefTRIAXone (ROCEPHIN) 2 g in sodium chloride 0.9 % 100 mL IVPB (0 g Intravenous Stopped 01/16/2022 0217)  heparin bolus via infusion  4,000 Units (4,000 Units Intravenous Bolus from Bag 02/13/2022 0151)  ondansetron (ZOFRAN) 4 MG/2ML injection (4 mg  Given 02/01/2022 0229)   Consults: Hospitalist   EMR reviewed including records from his last admission to the hospital from May 2023 for an NSTEMI and CHF exacerbation    FINAL CLINICAL IMPRESSION(S) / ED DIAGNOSES   Final diagnoses:  Acute respiratory failure with hypoxia ([name])  Acute on chronic congestive heart failure, unspecified heart failure type ([place])  Community acquired pneumonia of right lung, unspecified part of lung  Atrial fibrillation with RVR ([place])     Rx / DC Orders   ED Discharge Orders     None        Note:  This document was prepared using Dragon voice recognition software and may include unintentional dictation errors.   Please note:  Patient was evaluated in Emergency Department today for the symptoms described in the history of present illness. Patient was evaluated in the context of the global COVID-19 pandemic, which necessitated consideration that the patient might be at risk for infection with the SARS-CoV-2 virus that causes COVID-19. Institutional protocols and algorithms that pertain to the evaluation of patients at risk for COVID-19 are in a state of rapid change based on information released by regulatory bodies including the CDC and federal and state organizations. These policies and algorithms were followed during the patient's care in the ED.  Some ED evaluations and interventions may be delayed as a result of limited staffing during the pandemic.       Alfred Levins, [place], MD 02/06/2022 [place], [place], MD 01/24/2022 [phone]

## 2022-01-31 ENCOUNTER — Inpatient Hospital Stay: Payer: Medicaid Other

## 2022-01-31 DIAGNOSIS — J9602 Acute respiratory failure with hypercapnia: Secondary | ICD-10-CM

## 2022-01-31 DIAGNOSIS — R0603 Acute respiratory distress: Secondary | ICD-10-CM

## 2022-01-31 LAB — COMPREHENSIVE METABOLIC PANEL
ALT: 26 U/L (ref 0–44)
AST: 21 U/L (ref 15–41)
Albumin: 3 g/dL — ABNORMAL LOW (ref 3.5–5.0)
Alkaline Phosphatase: 64 U/L (ref 38–126)
Anion gap: 9 (ref 5–15)
BUN: 33 mg/dL — ABNORMAL HIGH (ref 6–20)
CO2: 22 mmol/L (ref 22–32)
Calcium: 9 mg/dL (ref 8.9–10.3)
Chloride: 105 mmol/L (ref 98–111)
Creatinine, Ser: 3.13 mg/dL — ABNORMAL HIGH (ref 0.61–1.24)
GFR, Estimated: 25 mL/min — ABNORMAL LOW (ref >60)
Glucose, Bld: 131 mg/dL — ABNORMAL HIGH (ref 70–99)
Potassium: 4.1 mmol/L (ref 3.5–5.1)
Sodium: 136 mmol/L (ref 135–145)
Total Bilirubin: 0.8 mg/dL (ref 0.3–1.2)
Total Protein: 6.8 g/dL (ref 6.5–8.1)

## 2022-01-31 LAB — AMMONIA: Ammonia: 23 umol/L (ref 9–35)

## 2022-01-31 LAB — CBC WITH DIFFERENTIAL/PLATELET
Abs Immature Granulocytes: 0.06 10*3/uL (ref 0.00–0.07)
Basophils Absolute: 0 10*3/uL (ref 0.0–0.1)
Basophils Relative: 0 %
Eosinophils Absolute: 0 10*3/uL (ref 0.0–0.5)
Eosinophils Relative: 0 %
HCT: 35.3 % — ABNORMAL LOW (ref 39.0–52.0)
Hemoglobin: 10.6 g/dL — ABNORMAL LOW (ref 13.0–17.0)
Immature Granulocytes: 1 %
Lymphocytes Relative: 11 %
Lymphs Abs: 1.4 10*3/uL (ref 0.7–4.0)
MCH: 20.6 pg — ABNORMAL LOW (ref 26.0–34.0)
MCHC: 30 g/dL (ref 30.0–36.0)
MCV: 68.7 fL — ABNORMAL LOW (ref 80.0–100.0)
Monocytes Absolute: 0.9 10*3/uL (ref 0.1–1.0)
Monocytes Relative: 7 %
Neutro Abs: 10.3 10*3/uL — ABNORMAL HIGH (ref 1.7–7.7)
Neutrophils Relative %: 81 %
Platelets: 218 10*3/uL (ref 150–400)
RBC: 5.14 MIL/uL (ref 4.22–5.81)
RDW: 18.2 % — ABNORMAL HIGH (ref 11.5–15.5)
WBC: 12.7 10*3/uL — ABNORMAL HIGH (ref 4.0–10.5)
nRBC: 0 % (ref 0.0–0.2)

## 2022-01-31 LAB — CBC
HCT: 39.9 % (ref 39.0–52.0)
Hemoglobin: 11.9 g/dL — ABNORMAL LOW (ref 13.0–17.0)
MCH: 20.5 pg — ABNORMAL LOW (ref 26.0–34.0)
MCHC: 29.8 g/dL — ABNORMAL LOW (ref 30.0–36.0)
MCV: 68.7 fL — ABNORMAL LOW (ref 80.0–100.0)
Platelets: 213 10*3/uL (ref 150–400)
RBC: 5.81 MIL/uL (ref 4.22–5.81)
RDW: 18.3 % — ABNORMAL HIGH (ref 11.5–15.5)
WBC: 14.1 10*3/uL — ABNORMAL HIGH (ref 4.0–10.5)
nRBC: 0 % (ref 0.0–0.2)

## 2022-01-31 LAB — BLOOD GAS, ARTERIAL
Acid-base deficit: 3.7 mmol/L — ABNORMAL HIGH (ref 0.0–2.0)
Acid-base deficit: 5.7 mmol/L — ABNORMAL HIGH (ref 0.0–2.0)
Bicarbonate: 22.1 mmol/L (ref 20.0–28.0)
Bicarbonate: 20.7 mmol/L (ref 20.0–28.0)
FIO2: 0.5 %
FIO2: 50 %
MECHVT: 550 mL
MECHVT: 550 mL
Mechanical Rate: 20
O2 Saturation: 99.5 %
O2 Saturation: 98.9 %
PEEP: 13 cmH2O
PEEP: 10 cmH2O
Patient temperature: 37
Patient temperature: 37
RATE: 20 resp/min
pCO2 arterial: 42 mmHg (ref 32–48)
pCO2 arterial: 43 mmHg (ref 32–48)
pH, Arterial: 7.33 — ABNORMAL LOW (ref 7.35–7.45)
pH, Arterial: 7.29 — ABNORMAL LOW (ref 7.35–7.45)
pO2, Arterial: 112 mmHg — ABNORMAL HIGH (ref 83–108)
pO2, Arterial: 100 mmHg (ref 83–108)

## 2022-01-31 LAB — GLUCOSE, CAPILLARY
Glucose-Capillary: 101 mg/dL — ABNORMAL HIGH (ref 70–99)
Glucose-Capillary: 121 mg/dL — ABNORMAL HIGH (ref 70–99)
Glucose-Capillary: 125 mg/dL — ABNORMAL HIGH (ref 70–99)

## 2022-01-31 LAB — STREP PNEUMONIAE URINARY ANTIGEN: Strep Pneumo Urinary Antigen: NEGATIVE

## 2022-01-31 LAB — BASIC METABOLIC PANEL
Anion gap: 10 (ref 5–15)
BUN: 31 mg/dL — ABNORMAL HIGH (ref 6–20)
CO2: 21 mmol/L — ABNORMAL LOW (ref 22–32)
Calcium: 8.8 mg/dL — ABNORMAL LOW (ref 8.9–10.3)
Chloride: 106 mmol/L (ref 98–111)
Creatinine, Ser: 2.44 mg/dL — ABNORMAL HIGH (ref 0.61–1.24)
GFR, Estimated: 33 mL/min — ABNORMAL LOW (ref >60)
Glucose, Bld: 133 mg/dL — ABNORMAL HIGH (ref 70–99)
Potassium: 3.5 mmol/L (ref 3.5–5.1)
Sodium: 137 mmol/L (ref 135–145)

## 2022-01-31 LAB — HEPARIN LEVEL (UNFRACTIONATED): Heparin Unfractionated: 1.07 IU/mL — ABNORMAL HIGH (ref 0.30–0.70)

## 2022-01-31 LAB — MAGNESIUM: Magnesium: 2.1 mg/dL (ref 1.7–2.4)

## 2022-01-31 LAB — APTT
aPTT: 59 seconds — ABNORMAL HIGH (ref 24–36)
aPTT: 76 seconds — ABNORMAL HIGH (ref 24–36)
aPTT: 64 seconds — ABNORMAL HIGH (ref 24–36)

## 2022-01-31 MED ORDER — ORAL CARE MOUTH RINSE: 15.0000 mL | OROMUCOSAL | Status: DC | PRN

## 2022-01-31 MED ORDER — POTASSIUM CHLORIDE 10 MEQ/50ML IV SOLN
10.0000 meq | INTRAVENOUS | Status: AC
  Administered 2022-01-31 (×2): 10 meq via INTRAVENOUS
  Filled 2022-01-31 (×2): qty 50

## 2022-01-31 MED ORDER — PHENYLEPHRINE CONCENTRATED 100MG/250ML (0.4 MG/ML) INFUSION SIMPLE
0.0000 ug/min | INTRAVENOUS | Status: DC
  Administered 2022-01-31: 75 ug/min via INTRAVENOUS
  Administered 2022-02-01: 90 ug/min via INTRAVENOUS
  Filled 2022-01-31 (×3): qty 250

## 2022-01-31 MED ORDER — HEPARIN BOLUS VIA INFUSION
1700.0000 [IU] | Freq: Once | INTRAVENOUS | Status: AC
Start: 2022-01-31 — End: 2022-01-31
  Administered 2022-01-31: 1700 [IU] via INTRAVENOUS
  Filled 2022-01-31: qty 1700

## 2022-01-31 MED ORDER — VITAL HIGH PROTEIN PO LIQD
1000.0000 mL | ORAL | Status: DC
  Administered 2022-01-31 – 2022-02-04 (×5): 1000 mL

## 2022-01-31 MED ORDER — MIDAZOLAM HCL 2 MG/2ML IJ SOLN: 2.0000 mg | Freq: Once | INTRAMUSCULAR | Status: AC

## 2022-01-31 MED ORDER — PROSOURCE TF PO LIQD
45.0000 mL | Freq: Three times a day (TID) | ORAL | Status: DC
  Administered 2022-01-31 – 2022-02-04 (×11): 45 mL
  Filled 2022-01-31 (×12): qty 45

## 2022-01-31 MED ORDER — SODIUM CHLORIDE 0.9 % IV SOLN
2.0000 g | Freq: Two times a day (BID) | INTRAVENOUS | Status: DC
  Administered 2022-01-31 (×2): 2 g via INTRAVENOUS
  Filled 2022-01-31 (×2): qty 12.5
  Filled 2022-01-31: qty 2

## 2022-01-31 MED ORDER — FREE WATER
30.0000 mL | Status: DC
  Administered 2022-01-31 – 2022-02-08 (×49): 30 mL

## 2022-01-31 MED ORDER — METOPROLOL TARTRATE 25 MG PO TABS
12.5000 mg | ORAL_TABLET | Freq: Four times a day (QID) | ORAL | Status: DC
Start: 2022-01-31 — End: 2022-02-01
  Administered 2022-01-31: 12.5 mg
  Filled 2022-01-31 (×2): qty 1

## 2022-01-31 MED ORDER — SODIUM CHLORIDE 0.9 % IV SOLN
25.0000 mg | Freq: Once | INTRAVENOUS | Status: DC
  Filled 2022-01-31: qty 1

## 2022-01-31 MED ORDER — FENTANYL CITRATE PF 50 MCG/ML IJ SOSY
PREFILLED_SYRINGE | INTRAMUSCULAR | Status: AC
  Administered 2022-01-31: 50 ug
  Filled 2022-01-31: qty 1

## 2022-01-31 MED ORDER — MIDAZOLAM HCL 2 MG/2ML IJ SOLN
INTRAMUSCULAR | Status: AC
  Filled 2022-01-31: qty 2

## 2022-01-31 MED ORDER — LINEZOLID 600 MG/300ML IV SOLN
600.0000 mg | Freq: Two times a day (BID) | INTRAVENOUS | Status: DC
  Administered 2022-01-31 – 2022-02-02 (×5): 600 mg via INTRAVENOUS
  Filled 2022-01-31 (×6): qty 300

## 2022-01-31 MED ORDER — ROCURONIUM BROMIDE 50 MG/5ML IV SOLN
50.0000 mg | Freq: Once | INTRAVENOUS | Status: AC
  Filled 2022-01-31: qty 5

## 2022-01-31 MED ORDER — ORAL CARE MOUTH RINSE
15.0000 mL | OROMUCOSAL | Status: DC
  Administered 2022-01-31 – 2022-02-01 (×11): 15 mL via OROMUCOSAL

## 2022-01-31 MED ORDER — FENTANYL CITRATE (PF) 100 MCG/2ML IJ SOLN: 100.0000 ug | Freq: Once | INTRAMUSCULAR | Status: AC

## 2022-01-31 MED ORDER — FUROSEMIDE 10 MG/ML IJ SOLN
20.0000 mg/h | INTRAVENOUS | Status: DC
  Administered 2022-01-31: 20 mg/h via INTRAVENOUS
  Administered 2022-01-31: 6 mg/h via INTRAVENOUS
  Filled 2022-01-31 (×2): qty 20

## 2022-01-31 MED ORDER — FENTANYL CITRATE (PF) 100 MCG/2ML IJ SOLN
INTRAMUSCULAR | Status: AC
  Administered 2022-01-31: 100 ug via INTRAVENOUS
  Filled 2022-01-31: qty 2

## 2022-01-31 MED ORDER — FENTANYL CITRATE PF 50 MCG/ML IJ SOSY: 50.0000 ug | PREFILLED_SYRINGE | Freq: Once | INTRAMUSCULAR | Status: AC

## 2022-01-31 MED ORDER — FUROSEMIDE 10 MG/ML IJ SOLN: 40.0000 mg | Freq: Once | INTRAMUSCULAR | Status: AC

## 2022-01-31 MED ORDER — MIDAZOLAM HCL 2 MG/2ML IJ SOLN
INTRAMUSCULAR | Status: AC
  Administered 2022-01-31: 2 mg via INTRAVENOUS
  Filled 2022-01-31: qty 2

## 2022-01-31 MED ORDER — METOPROLOL TARTRATE 25 MG PO TABS
12.5000 mg | ORAL_TABLET | Freq: Four times a day (QID) | ORAL | Status: DC
  Administered 2022-01-31: 12.5 mg via ORAL
  Filled 2022-01-31: qty 1

## 2022-01-31 MED ORDER — FAMOTIDINE IN NACL 20-0.9 MG/50ML-% IV SOLN
20.0000 mg | Freq: Two times a day (BID) | INTRAVENOUS | Status: DC
  Administered 2022-01-31 – 2022-02-02 (×4): 20 mg via INTRAVENOUS
  Filled 2022-01-31 (×4): qty 50

## 2022-01-31 MED ORDER — ETOMIDATE 2 MG/ML IV SOLN: 20.0000 mg | Freq: Once | INTRAVENOUS | Status: AC

## 2022-01-31 MED ORDER — HEPARIN BOLUS VIA INFUSION
1500.0000 [IU] | Freq: Once | INTRAVENOUS | Status: AC
  Administered 2022-01-31: 1500 [IU] via INTRAVENOUS
  Filled 2022-01-31: qty 1500

## 2022-01-31 MED ORDER — ATORVASTATIN CALCIUM 20 MG PO TABS
40.0000 mg | ORAL_TABLET | Freq: Every day | ORAL | Status: DC
  Administered 2022-02-01: 40 mg
  Filled 2022-01-31: qty 2

## 2022-01-31 MED ORDER — PHENYLEPHRINE HCL-NACL 20-0.9 MG/250ML-% IV SOLN
0.0000 ug/min | INTRAVENOUS | Status: DC
  Administered 2022-01-31: 25 ug/min via INTRAVENOUS
  Filled 2022-01-31: qty 250

## 2022-01-31 MED ORDER — PROPOFOL 1000 MG/100ML IV EMUL
0.0000 ug/kg/min | INTRAVENOUS | Status: DC
  Administered 2022-01-31 (×2): 20 ug/kg/min via INTRAVENOUS
  Administered 2022-01-31: 15 ug/kg/min via INTRAVENOUS
  Administered 2022-01-31 (×2): 20 ug/kg/min via INTRAVENOUS
  Administered 2022-02-01 (×2): 25 ug/kg/min via INTRAVENOUS
  Administered 2022-02-01: 30 ug/kg/min via INTRAVENOUS
  Administered 2022-02-01: 25 ug/kg/min via INTRAVENOUS
  Administered 2022-02-01 – 2022-02-02 (×2): 30 ug/kg/min via INTRAVENOUS
  Administered 2022-02-02: 10 ug/kg/min via INTRAVENOUS
  Administered 2022-02-02: 30 ug/kg/min via INTRAVENOUS
  Administered 2022-02-02: 25 ug/kg/min via INTRAVENOUS
  Administered 2022-02-03: 30 ug/kg/min via INTRAVENOUS
  Administered 2022-02-03 (×3): 25 ug/kg/min via INTRAVENOUS
  Administered 2022-02-03: 30 ug/kg/min via INTRAVENOUS
  Administered 2022-02-03: 25 ug/kg/min via INTRAVENOUS
  Administered 2022-02-04: 30 ug/kg/min via INTRAVENOUS
  Administered 2022-02-04: 40 ug/kg/min via INTRAVENOUS
  Administered 2022-02-04: 30 ug/kg/min via INTRAVENOUS
  Administered 2022-02-04: 40 ug/kg/min via INTRAVENOUS
  Administered 2022-02-04: 30 ug/kg/min via INTRAVENOUS
  Administered 2022-02-04: 35 ug/kg/min via INTRAVENOUS
  Administered 2022-02-04: 40 ug/kg/min via INTRAVENOUS
  Administered 2022-02-05: 30 ug/kg/min via INTRAVENOUS
  Administered 2022-02-05 (×2): 40 ug/kg/min via INTRAVENOUS
  Administered 2022-02-05: 20 ug/kg/min via INTRAVENOUS
  Administered 2022-02-05 (×2): 30 ug/kg/min via INTRAVENOUS
  Filled 2022-01-31 (×33): qty 100

## 2022-01-31 MED ORDER — ETOMIDATE 2 MG/ML IV SOLN
INTRAVENOUS | Status: AC
  Administered 2022-01-31: 20 mg via INTRAVENOUS
  Filled 2022-01-31: qty 20

## 2022-01-31 MED ORDER — FENTANYL 2500MCG IN NS 250ML (10MCG/ML) PREMIX INFUSION
0.0000 ug/h | INTRAVENOUS | Status: DC
  Administered 2022-01-31: 75 ug/h via INTRAVENOUS
  Administered 2022-01-31: 50 ug/h via INTRAVENOUS
  Administered 2022-02-01: 100 ug/h via INTRAVENOUS
  Administered 2022-02-02: 125 ug/h via INTRAVENOUS
  Administered 2022-02-03: 100 ug/h via INTRAVENOUS
  Administered 2022-02-04 – 2022-02-06 (×5): 125 ug/h via INTRAVENOUS
  Administered 2022-02-07: 150 ug/h via INTRAVENOUS
  Administered 2022-02-07: 200 ug/h via INTRAVENOUS
  Administered 2022-02-08: 150 ug/h via INTRAVENOUS
  Administered 2022-02-09 (×2): 200 ug/h via INTRAVENOUS
  Administered 2022-02-10: 150 ug/h via INTRAVENOUS
  Administered 2022-02-10: 180 ug/h via INTRAVENOUS
  Administered 2022-02-11 – 2022-02-12 (×3): 150 ug/h via INTRAVENOUS
  Administered 2022-02-13 – 2022-02-14 (×3): 175 ug/h via INTRAVENOUS
  Filled 2022-01-31 (×23): qty 250

## 2022-01-31 MED ORDER — SODIUM CHLORIDE 0.9 % IV SOLN
25.0000 mg | Freq: Once | INTRAVENOUS | Status: AC
  Administered 2022-01-31: 25 mg via INTRAVENOUS
  Filled 2022-01-31: qty 1

## 2022-01-31 MED ORDER — INSULIN ASPART 100 UNIT/ML IJ SOLN
0.0000 [IU] | INTRAMUSCULAR | Status: DC
  Filled 2022-01-31: qty 1

## 2022-01-31 MED ORDER — MIDAZOLAM HCL 2 MG/2ML IJ SOLN
2.0000 mg | Freq: Once | INTRAMUSCULAR | Status: AC
  Administered 2022-01-31: 2 mg via INTRAVENOUS

## 2022-01-31 MED ORDER — ROCURONIUM BROMIDE 10 MG/ML (PF) SYRINGE
PREFILLED_SYRINGE | INTRAVENOUS | Status: AC
  Administered 2022-01-31: 50 mg via INTRAVENOUS
  Filled 2022-01-31: qty 10

## 2022-01-31 MED ORDER — METRONIDAZOLE 500 MG/100ML IV SOLN
500.0000 mg | Freq: Two times a day (BID) | INTRAVENOUS | Status: DC
  Administered 2022-01-31 – 2022-02-03 (×7): 500 mg via INTRAVENOUS
  Filled 2022-01-31 (×7): qty 100

## 2022-01-31 MED ORDER — FUROSEMIDE 10 MG/ML IJ SOLN
INTRAMUSCULAR | Status: AC
  Administered 2022-01-31: 40 mg via INTRAVENOUS
  Filled 2022-01-31: qty 4

## 2022-01-31 MED ORDER — DIGOXIN 0.25 MG/ML IJ SOLN: 0.2500 mg | Freq: Once | INTRAMUSCULAR | Status: DC

## 2022-01-31 NOTE — Progress Notes (Signed)
[facility] for Heparin Infusion Indication: ACS/STEMI  Patient Measurements: Height: 6' (182.9 cm) Weight: 131.5 kg (290 lb) IBW/kg (Calculated) : 77.6 Heparin Dosing Weight: 109.9 kg  Labs: Recent Labs    02/01/2022 0023 01/21/2022 0023 02/02/2022 0028 01/19/2022 0153 01/26/2022 0537 02/02/2022 6861 01/29/2022 1343 02/04/2022 2201 01/31/22 0505  HGB 12.0*  --   --   --   --  11.3*  --   --  11.9*  HCT 40.5  --   --   --   --  37.2*  --   --  39.9  PLT 233  --   --   --   --  229  --   --  213  APTT  --    < > 50*  --   --  70* 59* 60* 59*  LABPROT  --   --  17.8*  --   --   --   --   --   --   INR  --   --  1.5*  --   --   --   --   --   --   HEPARINUNFRC  --   --   --  >1.10*  --   --   --   --  1.07*  CREATININE 2.43*  --   --   --   --  2.22*  --   --  2.44*  TROPONINIHS 553*  --   --  472* 500* 450*  --   --   --    < > = values in this interval not displayed.     Estimated Creatinine Clearance: 55.9 mL/min (A) (by C-G formula based on SCr of 2.44 mg/dL (H)).   Medical History: Past Medical History:  Diagnosis Date   Acute on chronic combined systolic and diastolic CHF (congestive heart failure) ([name])    Acute on chronic systolic CHF (congestive heart failure) ([place]) 68/37/2902   Acute systolic congestive heart failure ([name])    Atrial flutter, paroxysmal ([name])    CHF (congestive heart failure) ([place]) 03/30/2021   Chronic combined systolic and diastolic CHF (congestive heart failure) ([name])    Chronic kidney disease, stage III (moderate) ([name])    Dysrhythmia    brief episode afib, cardioverted did not return   Hypertension    Hypertensive crisis 01/20/2021   Hypertensive emergency 12/21/2020   NSTEMI (non-ST elevated myocardial infarction) ([place]) 12/21/2015   Polysubstance abuse ([place])     Medications:  PTA meds include Xarelto 20 mg daily.  Last dose unknown.  Checking baseline HL, aPTT, & INR  Assessment: Pt is 41 yo male presenting to ED  c/o SOB, found w/ elevated BNP and Troponin I lvls.  Goal of Therapy:  aPTT goal: 66-102 Heparin level 0.3-0.7 units/ml Monitor platelets by anticoagulation protocol: Yes   Plan:  aPTT subtherapeutic, HL supratherapeutic Heparin 1500 unit IV bolus Increase heparin infusion to 1850 units/hr BL HL > 1.1, will follow aPTT until correlates w/ HL Recheck aPTT w/ AM labs HL & CBC daily while on heparin  Renda Rolls, PharmD, [facility] 01/31/2022 6:03 AM

## 2022-01-31 NOTE — Progress Notes (Signed)
Initial Nutrition Assessment  DOCUMENTATION CODES:   Obesity unspecified  INTERVENTION:   Vital HP @60ml /hr- Initiate at 31ml/hr and increase by 62ml/hr q 8 hours until goal rate is reached.   Pro-Source 22ml TID via tube, provides 40kcal and 11g of protein per serving   Propofol: 15.78 ml/hr- provides 416kcal/day   Free water flushes 19ml q4 hours to maintain tube patency   Regimen provides 1976kcal/day, 159g/day protein and 1335ml/day of water   NUTRITION DIAGNOSIS:   Inadequate oral intake related to inability to eat (pt sedated and ventilated) as evidenced by NPO status.  GOAL:   Provide needs based on ASPEN/SCCM guidelines  MONITOR:   Vent status, Labs, Weight trends, TF tolerance, Skin, I & O's  REASON FOR ASSESSMENT:   Ventilator    ASSESSMENT:   41 y/o male with h/o Afib, CKD, HTN, substance abuse, CAD, NSTEMI and CHF who is admitted with PNA, sepsis, CHF and PEA arrest.  Pt sedated and ventilated. OGT in place. Will plan to initiate tube feeds today. Per chart, pt appears weight stable pta.   Medications reviewed and include: cefepime, lasix, heparin, metronidazole, propofol   Labs reviewed: K 4.1 wnl, BUN 33(H), creat 3.13(H), Mg 2.1 wnl Wbc- 12.7(H), Hgb 10.6(L), Hct 35.3(L) Cbgs- 125, 125 x 24 hrs AIC 6.1(H)- 1/19  Patient is currently intubated on ventilator support MV: 13.6 L/min Temp (24hrs), Avg:99.8 F (37.7 C), Min:98.9 F (37.2 C), Max:102 F (38.9 C)  Propofol: 15.78 ml/hr- provides 416kcal/day   MAP- >48mmHg   UOP- 2450kcal/day   NUTRITION - FOCUSED PHYSICAL EXAM:  Flowsheet Row Most Recent Value  Orbital Region No depletion  Upper Arm Region No depletion  Thoracic and Lumbar Region No depletion  Buccal Region No depletion  Temple Region No depletion  Clavicle Bone Region No depletion  Clavicle and Acromion Bone Region No depletion  Scapular Bone Region No depletion  Dorsal Hand No depletion  Patellar Region No depletion   Anterior Thigh Region No depletion  Posterior Calf Region No depletion  Edema (RD Assessment) Moderate  Hair Reviewed  Eyes Reviewed  Mouth Reviewed  Skin Reviewed  Nails Reviewed   Diet Order:   Diet Order             Diet Heart Room service appropriate? Yes; Fluid consistency: Thin; Fluid restriction: 1200 mL Fluid  Diet effective now                  EDUCATION NEEDS:   No education needs have been identified at this time  Skin:  Skin Assessment: Reviewed RN Assessment  Last BM:  pta  Height:   Ht Readings from Last 1 Encounters:  01/16/2022 6' (1.829 m)    Weight:   Wt Readings from Last 1 Encounters:  01/18/2022 131.5 kg    Ideal Body Weight:  80.9 kg  BMI:  Body mass index is 39.33 kg/m.  Estimated Nutritional Needs:   Kcal:  1446-1841kcal/day  Protein:  >160g/day  Fluid:  2.5-2.8L/day  Koleen Distance MS, RD, LDN Please refer to [facility] for RD and/or RD on-call/weekend/after hours pager

## 2022-01-31 NOTE — Progress Notes (Signed)
eLink Physician-Brief Progress Note Patient Name: Gerald [name] DOB: [birth year] MRN: [id number]   Date of Service  01/31/2022  HPI/Events of Note  Patient with cardiac arrest s/p CPR with ROSC, now in atrial fibrillation with RVR despite amiodarone gtt, K+ is 3.5.  eICU Interventions  Digoxin 0.25 mg iv x 1 ordered, KCL 20 meq iv over 2 hours to  try to bring his K+ closer to 4.0.        Kerry Kass Carston Riedl 01/31/2022, 5:53 AM

## 2022-01-31 NOTE — Progress Notes (Signed)
Attempted to contact patient's mother Donnie Coffin who is the next of kin for updates and change in status requiring Intubation. No answer, unable to leave a voice message. Will attempt again later or pass on day shift team to update.   Rufina Falco, DNP, CCRN, FNP-C, AGACNP-BC Acute Care & Family Nurse Practitioner  [facility]  See Amion for personal pager PCCM on call pager [phone] until 7 am

## 2022-01-31 NOTE — Progress Notes (Signed)
Patient bagged due to low sats. Cxr performed to check for tube placement. Peep increased to 15 per NP

## 2022-01-31 NOTE — Progress Notes (Signed)
ANTICOAGULATION CONSULT NOTE   Pharmacy Consult for Heparin Infusion Indication: ACS/STEMI  Patient Measurements: Height: 6' (182.9 cm) Weight: 131.5 kg (290 lb) IBW/kg (Calculated) : 77.6 Heparin Dosing Weight: 109.9 kg  Labs: Recent Labs    01/18/2022 0028 01/21/2022 0153 01/27/2022 0537 02/06/2022 4462 02/02/2022 1343 02/06/2022 2201 01/31/22 0505 01/31/22 0802 01/31/22 1221  HGB  --   --   --  11.3*  --   --  11.9* 10.6*  --   HCT  --   --   --  37.2*  --   --  39.9 35.3*  --   PLT  --   --   --  229  --   --  213 218  --   APTT 50*  --   --  70*   < > 60* 59*  --  76*  LABPROT 17.8*  --   --   --   --   --   --   --   --   INR 1.5*  --   --   --   --   --   --   --   --   HEPARINUNFRC  --  >1.10*  --   --   --   --  1.07*  --   --   CREATININE  --   --   --  2.22*  --   --  2.44* 3.13*  --   TROPONINIHS  --  472* 500* 450*  --   --   --   --   --    < > = values in this interval not displayed.     Estimated Creatinine Clearance: 43.6 mL/min (A) (by C-G formula based on SCr of 3.13 mg/dL (H)).   Medical History: Past Medical History:  Diagnosis Date   Acute on chronic combined systolic and diastolic CHF (congestive heart failure) ([name])    Acute on chronic systolic CHF (congestive heart failure) ([place]) 86/38/1771   Acute systolic congestive heart failure ([name])    Atrial flutter, paroxysmal ([name])    CHF (congestive heart failure) ([place]) 03/30/2021   Chronic combined systolic and diastolic CHF (congestive heart failure) ([name])    Chronic kidney disease, stage III (moderate) ([name])    Dysrhythmia    brief episode afib, cardioverted did not return   Hypertension    Hypertensive crisis 01/20/2021   Hypertensive emergency 12/21/2020   NSTEMI (non-ST elevated myocardial infarction) ([place]) 12/21/2015   Polysubstance abuse ([place])     Medications:  PTA meds include Xarelto 20 mg daily.  Last dose unknown.  Checking baseline HL, aPTT, & INR  Assessment: Pt is 41 yo male presenting to ED  c/o SOB, found w/ elevated BNP and Troponin I lvls.  Goal of Therapy:  aPTT goal: 66-102 Heparin level 0.3-0.7 units/ml Monitor platelets by anticoagulation protocol: Yes   Plan:  aPTT is therapeutic x 1 Continue heparin infusion at 1850 units/hr BL HL > 1.1, will follow aPTT until correlates w/ HL Confirmatory aPTT in 6 hours HL & CBC daily while on heparin  Benita Gutter  01/31/2022 1:12 PM

## 2022-01-31 NOTE — Progress Notes (Signed)
ANTICOAGULATION CONSULT NOTE   Pharmacy Consult for Heparin Infusion Indication: ACS/STEMI  Patient Measurements: Height: 6' (182.9 cm) Weight: 131.5 kg (290 lb) IBW/kg (Calculated) : 77.6 Heparin Dosing Weight: 109.9 kg  Labs: Recent Labs    01/23/2022 0028 01/31/2022 0153 02/11/2022 0537 01/27/2022 6712 02/02/2022 1343 01/31/22 0505 01/31/22 0802 01/31/22 1221 01/31/22 1803  HGB  --   --   --  11.3*  --  11.9* 10.6*  --   --   HCT  --   --   --  37.2*  --  39.9 35.3*  --   --   PLT  --   --   --  229  --  213 218  --   --   APTT 50*  --   --  70*   < > 59*  --  76* 64*  LABPROT 17.8*  --   --   --   --   --   --   --   --   INR 1.5*  --   --   --   --   --   --   --   --   HEPARINUNFRC  --  >1.10*  --   --   --  1.07*  --   --   --   CREATININE  --   --   --  2.22*  --  2.44* 3.13*  --   --   TROPONINIHS  --  472* 500* 450*  --   --   --   --   --    < > = values in this interval not displayed.     Estimated Creatinine Clearance: 43.6 mL/min (A) (by C-G formula based on SCr of 3.13 mg/dL (H)).   Medical History: Past Medical History:  Diagnosis Date   Acute on chronic combined systolic and diastolic CHF (congestive heart failure) ([name])    Acute on chronic systolic CHF (congestive heart failure) ([place]) 45/80/9983   Acute systolic congestive heart failure ([name])    Atrial flutter, paroxysmal ([name])    CHF (congestive heart failure) ([place]) 03/30/2021   Chronic combined systolic and diastolic CHF (congestive heart failure) ([name])    Chronic kidney disease, stage III (moderate) ([name])    Dysrhythmia    brief episode afib, cardioverted did not return   Hypertension    Hypertensive crisis 01/20/2021   Hypertensive emergency 12/21/2020   NSTEMI (non-ST elevated myocardial infarction) ([place]) 12/21/2015   Polysubstance abuse ([place])     Medications:  PTA meds include Xarelto 20 mg daily.  Last dose unknown.  Checking baseline HL, aPTT, & INR  Assessment: Pt is 41 yo male presenting to ED  c/o SOB, found w/ elevated BNP and Troponin I lvls.  Goal of Therapy:  aPTT goal: 66-102 Heparin level 0.3-0.7 units/ml Monitor platelets by anticoagulation protocol: Yes  Date Time aPTT/HL Rate/Comment 6/16 1221 76/---  Therapeutic x1 6/16 1803 64/---  Subtherapeutic  Plan:  aPTT is subtherapeutic. Give bolus of 1700 units x1 then increase heparin infusion to 2000 units/hr BL HL > 1.1, will follow aPTT until correlates w/ HL Confirmatory aPTT in 6 hours HL & CBC daily while on heparin  Darrick Penna  01/31/2022 7:56 PM

## 2022-01-31 NOTE — Progress Notes (Signed)
Progress Note  Patient Name: Gerald [name] Date of Encounter: 01/31/2022  [facility] Cardiologist: Ida Rogue, MD   Subjective   The patient's respiratory status continued to deteriorate overnight.  He was given morphine for comfort but became more lethargic.  Attempted intubation this morning was not successful and the patient went into PEA cardiac arrest.  He received 1 round of epi with subsequent ROSC.  ED physician ultimately intubated the patient.  The patient is currently intubated and sedated.  He is in A-fib with RVR.  Inpatient Medications    Scheduled Meds:  atorvastatin  40 mg Oral Daily   Chlorhexidine Gluconate Cloth  6 each Topical Q0600   lidocaine  1 patch Transdermal Q24H   metoprolol tartrate  12.5 mg Oral Q6H   Continuous Infusions:  amiodarone 60 mg/hr (01/31/22 0831)   ceFEPime (MAXIPIME) IV     fentaNYL infusion INTRAVENOUS 50 mcg/hr (01/31/22 0605)   furosemide (LASIX) 200 mg in dextrose 5 % 100 mL (2 mg/mL) infusion     heparin 1,850 Units/hr (01/31/22 2671)   metronidazole     potassium chloride 10 mEq (01/31/22 0827)   propofol (DIPRIVAN) infusion 20 mcg/kg/min (01/31/22 0559)   PRN Meds: acetaminophen, guaiFENesin-dextromethorphan, morphine injection   Vital Signs    Vitals:   01/31/22 0600 01/31/22 0636 01/31/22 0700 01/31/22 0805  BP: (!) 159/103 121/86 (!) 115/92   Pulse: (!) 130 60 (!) 115   Resp: (!) 27  (!) 28   Temp:      TempSrc:      SpO2: 93% (!) 82% 95% 98%  Weight:      Height:        Intake/Output Summary (Last 24 hours) at 01/31/2022 0909 Last data filed at 01/31/2022 0446 Gross per 24 hour  Intake 1962.37 ml  Output 2450 ml  Net -487.63 ml      01/18/2022    3:00 AM 01/16/2022   12:16 AM 11/05/2021   11:44 AM  Last 3 Weights  Weight (lbs) 290 lb 308 lb 10.3 oz 308 lb 3.3 oz  Weight (kg) 131.543 kg 140 kg 139.8 kg      Telemetry    Atrial fibrillation with rapid ventricular response.- Personally  Reviewed  ECG     - Personally Reviewed  Physical Exam   GEN: Intubated and sedated. Neck: Jugular venous pressure is not well visualized Cardiac: Irregularly irregular and tachycardic, no murmurs, rubs, or gallops.  Respiratory: Diminished breath sounds bilaterally especially at the base. GI: Soft, nontender, non-distended  MS: No deformity.  Mild bilateral leg edema. Neuro:  Nonfocal  Psych: Normal affect   Labs    High Sensitivity Troponin:   Recent Labs  Lab 01/31/2022 0023 02/06/2022 0153 01/23/2022 0537 01/17/2022 0638  TROPONINIHS 553* 472* 500* 450*     Chemistry Recent Labs  Lab 01/31/2022 0023 02/10/2022 0638 01/31/22 0505  NA 137 139 137  K 3.4* 3.6 3.5  CL 107 110 106  CO2 21* 20* 21*  GLUCOSE 113* 132* 133*  BUN 39* 35* 31*  CREATININE 2.43* 2.22* 2.44*  CALCIUM 9.3 8.8* 8.8*  MG 2.3 2.1 2.1  PROT 8.0 7.6  --   ALBUMIN 3.7 3.4*  --   AST 21 21  --   ALT 34 32  --   ALKPHOS 84 80  --   BILITOT 1.5* 1.5*  --   GFRNONAA 33* 37* 33*  ANIONGAP 9 9 10     Lipids No results for  input(s): "CHOL", "TRIG", "HDL", "LABVLDL", "LDLCALC", "CHOLHDL" in the last 168 hours.  Hematology Recent Labs  Lab 01/31/2022 0023 01/26/2022 0638 01/31/22 0505  WBC 7.9 9.7 14.1*  RBC 5.88* 5.54 5.81  HGB 12.0* 11.3* 11.9*  HCT 40.5 37.2* 39.9  MCV 68.9* 67.1* 68.7*  MCH 20.4* 20.4* 20.5*  MCHC 29.6* 30.4 29.8*  RDW 18.8* 18.1* 18.3*  PLT 233 229 213   Thyroid  Recent Labs  Lab 01/29/2022 0638  TSH 0.920    BNP Recent Labs  Lab 02/08/2022 0023  BNP 793.1*    DDimer  Recent Labs  Lab 02/01/2022 0638  DDIMER 0.40     Radiology    DG Chest 1 View  Result Date: 01/31/2022 CLINICAL DATA:  41 year old male status post endotracheal tube placement. EXAM: CHEST  1 VIEW COMPARISON:  Chest x-ray 01/21/2022. FINDINGS: Patient is now intubated, with the tip of the endotracheal tube 6.9 cm above the carina. A nasogastric tube is seen extending into the stomach, however, the tip  of the nasogastric tube extends below the lower margin of the image. Transcutaneous defibrillator pad projecting over the mid thorax. Lung volumes remain low. Near complete opacification of the right hemithorax and extensive opacification in the left mid to lower lung, similar to the prior study, indicative of severe bilateral (right-greater-than-left) multilobar pneumonia. No pneumothorax. Moderate cardiomegaly. The patient is rotated to the right on today's exam, resulting in distortion of the mediastinal contours and reduced diagnostic sensitivity and specificity for mediastinal pathology. IMPRESSION: 1. Support apparatus, as above. 2. Severe multilobar bilateral pneumonia (right-greater-than-left) redemonstrated. Electronically Signed   By: Vinnie Langton M.D.   On: 01/31/2022 06:49   DG Chest 1 View  Result Date: 01/31/2022 CLINICAL DATA:  41 year old male with history of shortness of breath. EXAM: CHEST  1 VIEW COMPARISON:  Chest x-ray 02/05/2022. FINDINGS: Worsening airspace consolidation throughout the entire right lung. Areas of interstitial prominence and patchy ill-defined opacities are also noted in the left lung, most confluent in the left lung base. No definite pleural effusions. No pneumothorax. Pulmonary vasculature is obscured. Heart size is mildly enlarged. IMPRESSION: 1. Worsening multilobar bilateral pneumonia (right-greater-than-left), as above. Electronically Signed   By: Vinnie Langton M.D.   On: 01/31/2022 05:34   DG Chest Port 1 View  Result Date: 02/04/2022 CLINICAL DATA:  Shortness of breath EXAM: PORTABLE CHEST 1 VIEW COMPARISON:  Chest x-ray dated January 30, 2022 FINDINGS: Unchanged cardiomegaly. Increased mid and lower lung predominant heterogeneous opacities. No definite pleural effusion. No evidence of pneumothorax. IMPRESSION: Increased heterogeneous opacities of the right lung, concerning for worsening infection. Electronically Signed   By: Yetta Glassman M.D.   On:  01/29/2022 11:12   DG Chest Port 1 View  Result Date: 01/17/2022 CLINICAL DATA:  41 year old male with history of shortness of breath, pneumonia and hemoptysis. EXAM: PORTABLE CHEST 1 VIEW COMPARISON:  Chest x-ray 01/21/2022. FINDINGS: Extensive areas of ill-defined airspace consolidation noted throughout the periphery of the right lung, most evident in the right mid to lower lung. No pleural effusions. No pneumothorax. Cephalization of the pulmonary vasculature. No frank pulmonary edema. Heart size is moderately enlarged. Upper mediastinal contours are unremarkable. IMPRESSION: 1. Severe multilobar pneumonia in the right lung, similar to the recent prior study. 2. Cardiomegaly with pulmonary venous congestion, but no frank pulmonary edema. Electronically Signed   By: Vinnie Langton M.D.   On: 02/04/2022 05:14   DG Chest Portable 1 View  Result Date: 02/04/2022 CLINICAL DATA:  Shortness of  breath EXAM: PORTABLE CHEST 1 VIEW COMPARISON:  11/02/2021 FINDINGS: Cardiomegaly. Patchy airspace disease throughout the right lung. No confluent opacity on the left. No effusions or acute bony abnormality. IMPRESSION: Patchy opacities within the right lung concerning for pneumonia. Electronically Signed   By: Rolm Baptise M.D.   On: 02/08/2022 00:56    Cardiac Studies   Most recent echocardiogram in January 2023 showed an EF of 20 to 25% with severe left ventricular hypertrophy and small pericardial effusion.  Patient Profile     41 y.o. male with a hx of morbid obesity, persistent atrial fibrillation, medication noncompliance, polysubstance abuse, systolic CHF, prior hospital admission March 2023 for atrial fibrillation with acute on chronic systolic CHF who presents with respiratory failure due to decompensated heart failure and A-fib with RVR  Assessment & Plan    1.  Acute respiratory failure due to decompensated heart failure: Continue supportive care.  The patient is currently intubated.  Continue  diuresis.  2.  Acute on chronic systolic heart failure with severely reduced LV systolic function.  Previous cardiac catheterization showed normal coronary arteries.  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 The patient is volume overloaded and I agree with furosemide drip.  His urine output was reasonable overnight.  The dose can be increased if needed based on response. No ACE inhibitor, ARB or Arni due to underlying chronic kidney disease as well as hypotension. Will initiate small dose metoprolol for rate control of atrial fibrillation. The patient has very poor compliance and known history of of cocaine use.  He is not a candidate for advanced heart failure treatment such as LVAD or cardiac transplant. We will plan on resuming a combination of hydralazine and Imdur once blood pressure allows. Inotropic support might be needed if the patient starts showing evidence of hypoperfusion.  3.  Persistent atrial fibrillation with RVR: He is on amiodarone drip due to lack of better options.  Continue anticoagulation with heparin.  I discontinued digoxin given that he has underlying chronic kidney disease and high risk for digoxin toxicity. I elected to start small dose metoprolol 12.5 mg every 6 hours which can be uptitrated as tolerated by blood pressure and ultimately switched to Toprol given his underlying cardiomyopathy.  I discussed the case with nursing and Dr. Milon Dikes from critical care.   Total encounter time more than 50 minutes. Greater than 50% was spent in counseling and coordination of care with the patient.      For questions or updates, please contact [facility] Please consult [email or web address] for contact info under        Signed, Kathlyn Sacramento, MD  01/31/2022, 9:09 AM

## 2022-01-31 NOTE — Progress Notes (Addendum)
41 y.o. male with a hx of morbid obesity, persistent atrial fibrillation, medication noncompliance, polysubstance abuse, systolic CHF, prior hospital admission March 2023 for atrial fibrillation with acute on chronic systolic CHF who presents with worsening shortness of breath over the past several days found to be in decompensated CHF in the setting of cardiomyopathy and Afib with RVR.  Overnight patient remained in Afib with RVR despite Amiodarone gtt. He was also noted to be in acute respiratory distress initially on HHFNC then transitioned to BiPAP with no improvement. He remained tachypneic with RR in the mid 50's, HR 150's and sats in the low 90's. Due to worsening respiratory status and high risk for decompensation, decision made to intubate. While attempting to intubate , patient was noted with copious amount of pink frothy secrections making visualization of the vocal cords difficulty. Shortly after he briefly lost pulse requiring 1 round of CPR/ACLS and Epi x 1 prior to ROSC. Patient successfully intubated by EDP.  Brady Asystole Cardiac Arrest with ROSC after 1 round of CPR/ACLS Acute Decompensated combined Systolic & Diastolic CHF Hypertensive Emergency  Afib with RVR PMHx: CAD, NSTEMI, MI with multiple PCI's, HLD, HTN S/p PCI  -Continuous cardiac monitoring -Vasopressors as needed to maintain MAP goal  -Lactic acid  -Trend HS Troponin until peaked  -Diuresis as BP and renal function permits, start Lasix gtt -Continue Amiodarone gtt, wean as tolerated -Continue Heparin gtt -Atorvastatin 80mg  PO daily -TEE showsglobal reduction in LV function; severe LVH; suggest cardiac MRI to R/O infiltrative cardiomyopathy or HCM. 2. Left ventricular ejection fraction, by estimation, is 20 to 25% -Cardiology following, appreciate input, may need TEE/intervention now that he is intubated.   Acute Hypoxic Hypercapnic Respiratory Failure in the setting of above, Aspiration Event, & Flash Pulmonary  Edema and Multifocal Pneumonia S/p -Wean PEEP and FiO2 for sats greater than 90% -Plateau pressures less than 30 cm H20 -Con't LTVV, Vt decreased due to high Pplat and rate increased.  -VAP bundle in place -Intermittent chest x-ray & ABG -F/u cultures, trend PCT -will broaden Pna coverage -Will send strep pneumo, Legionella antigen and mycoplasma, trachael aspirate -wean sedation/analgesia for RASS goal 0 -Consider Bronchoscopy given worsening xray findings  Sepsis in the setting of severe Multilobar Bilateral Pneumonia Meets SIRS Criteria  -Monitor fever curve -Trend WBC's & Procalcitonin -Follow cultures as above -will broaden empiric abx pending cultures & sensitivities   AKI on CKD, likely ATN in the setting of above, worsened with aggressive diuresis -Monitor I&O's / urinary output -Follow BMP -Ensure adequate renal perfusion -Avoid nephrotoxic agents as able -Replace electrolytes as indicated -Pharmacy following for assistance with electrolyte replacement -Nephrology consult     Rufina Falco, DNP, CCRN, FNP-C, AGACNP-BC Acute Care & Family Nurse Practitioner  [facility]  See Amion for personal pager PCCM on call pager [phone] until 7 am  Seen and examined the patient discussed the case with Rufina Falco NP Patient with acute decompensated respiratory failure leading to short cardiac arrest 1 round now intubated requiring pressors A-line placed continue with antibiotic's febrile septic shock associated with dilated cardiomyopathy acute renal failure on Lasix drip very grim prognosis call us with question thank you for this consultation critical care time spent in the care of this patient is 58 minutes.

## 2022-01-31 NOTE — Progress Notes (Signed)
Pt's HR was progressively getting higher throughout the night, reaching up to the 160s, non-sustained. He remained in Afib with a left bundle branch block. He continued to have chest pain and coughing fits with bloody sputum. Morphine was given prn and and EKG was preformed, NP notified. He vomited once around 0100 and phenergan was given. Pt was increasing anxious and respirations reached the 50s. A one time dose of 50 mcg of fentanyl was given with little aid. Notified NP again due to pt becoming more lethargic, and work of breathing decreased drastically due to fatigue and decreased LOC , HR sustaining in the 130s. NP at bedside to emergently intubate around 0530, while attempting intubation pt's HR went down to the 50s and PEA. code blue initiated and 1 round or epi given with 1 round of compressions. Regained pulse and ER doc aided in successful intubation. OG tube placed by ER charge nurse, and trialysis cath placed afterwards by NP. CXR obtained for ET placement was pulled back by few cm per NP, and propanolol & fentanyl started for sedation. Pt remains tachy in the 120s and in a-fib.

## 2022-01-31 NOTE — Procedures (Addendum)
Central Venous Catheter Insertion Procedure Note  Gerald [name]  [id number]  06/13/1981  Date:01/31/22  Time:7:33 AM   Provider Performing:Elizabeth A Ouma   Procedure: Insertion of Non-tunneled Central Venous Catheter(36556) with US guidance (88110)   Indication(s) Medication administration, Difficult access, and Hemodialysis  Consent Unable to obtain consent due to emergent nature of procedure.  Anesthesia Topical only with 1% lidocaine   Timeout Verified patient identification, verified procedure, site/side was marked, verified correct patient position, special equipment/implants available, medications/allergies/relevant history reviewed, required imaging and test results available.  Sterile Technique Maximal sterile technique including full sterile barrier drape, hand hygiene, sterile gown, sterile gloves, mask, hair covering, sterile ultrasound probe cover (if used).  Procedure Description Area of catheter insertion was cleaned with chlorhexidine and draped in sterile fashion.  With real-time ultrasound guidance a central venous catheter was placed into the right femoral vein. Nonpulsatile blood flow and easy flushing noted in all ports.  The catheter was sutured in place and sterile dressing applied.  Complications/Tolerance None; patient tolerated the procedure well. Chest X-ray is ordered to verify placement for internal jugular or subclavian cannulation.   Chest x-ray is not ordered for femoral cannulation.  EBL Minimal  Specimen(s) None   Rufina Falco, DNP, CCRN, FNP-C, AGACNP-BC Acute Care & Family Nurse Practitioner  [facility]  See Amion for personal pager PCCM on call pager [phone] until 7 am  I was available for this procedure

## 2022-01-31 NOTE — Progress Notes (Signed)
PEEP decreased to 10 based on last ABG results 7.33/42/112/22.1

## 2022-01-31 NOTE — ED Provider Notes (Signed)
Department of Emergency Medicine CODE BLUE CONSULTATION    Code Blue CONSULT NOTE  Chief Complaint: Cardiac arrest/unresponsive   Level V Caveat: Unresponsive  History of present illness: I was contacted by the hospital ICU provider for a CODE BLUE cardiac arrest upstairs and presented to the patient's bedside.  Patient was actively undergoing CPR. Per ICU provider the patient had received intubation drugs by they were unable to intubate.   ROS: Unable to obtain, Level V caveat  Scheduled Meds:  amLODipine  10 mg Oral Daily   atorvastatin  40 mg Oral Daily   Chlorhexidine Gluconate Cloth  6 each Topical Q0600   digoxin  0.25 mg Intravenous Once   heparin  1,500 Units Intravenous Once   hydrALAZINE  50 mg Oral TID   isosorbide mononitrate  120 mg Oral Daily   lidocaine  1 patch Transdermal Q24H   Continuous Infusions:  amiodarone 60 mg/hr (01/31/22 0446)   cefTRIAXone (ROCEPHIN)  IV Stopped (01/31/22 0033)   doxycycline (VIBRAMYCIN) IV Stopped (01/18/2022 2012)   fentaNYL infusion INTRAVENOUS 50 mcg/hr (01/31/22 0605)   furosemide (LASIX) 200 mg in dextrose 5 % 100 mL (2 mg/mL) infusion     heparin 1,700 Units/hr (01/31/22 0446)   potassium chloride     propofol (DIPRIVAN) infusion 20 mcg/kg/min (01/31/22 0559)   PRN Meds:.acetaminophen, guaiFENesin-dextromethorphan, morphine injection Past Medical History:  Diagnosis Date   Acute on chronic combined systolic and diastolic CHF (congestive heart failure) ([name])    Acute on chronic systolic CHF (congestive heart failure) ([place]) 54/04/8118   Acute systolic congestive heart failure ([name])    Atrial flutter, paroxysmal ([name])    CHF (congestive heart failure) ([place]) 03/30/2021   Chronic combined systolic and diastolic CHF (congestive heart failure) ([name])    Chronic kidney disease, stage III (moderate) ([name])    Dysrhythmia    brief episode afib, cardioverted did not return   Hypertension    Hypertensive crisis 01/20/2021    Hypertensive emergency 12/21/2020   NSTEMI (non-ST elevated myocardial infarction) ([place]) 12/21/2015   Polysubstance abuse ([place])    Past Surgical History:  Procedure Laterality Date   KNEE SURGERY Right    LEFT HEART CATH AND CORONARY ANGIOGRAPHY N/A 12/21/2020   Procedure: LEFT HEART CATH AND CORONARY ANGIOGRAPHY;  Surgeon: Martinique, Peter M, MD;  Location: [facility];  Service: Cardiovascular;  Laterality: N/A;   TEE WITHOUT CARDIOVERSION N/A 04/04/2019   Procedure: TRANSESOPHAGEAL ECHOCARDIOGRAM (TEE);  Surgeon: [name], MD;  Location: [facility];  Service: Cardiovascular;  Laterality: N/A;   Social History   Socioeconomic History   Marital status: Single    Spouse name: Not on file   Number of children: 3   Years of education: Not on file   Highest education level: 11th grade  Occupational History   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   Sexual  activity: Yes    Partners: Female    Birth control/protection: Condom  Other Topics Concern   Not on file  Social History Narrative   Not on file   Social Determinants of Health   Financial Resource Strain: High Risk (07/08/2021)   Overall Financial Resource Strain (CARDIA)    Difficulty of Paying Living Expenses: Hard  Food Insecurity: No Food Insecurity (07/08/2021)   Hunger Vital Sign    Worried About Running Out of Food in the Last Year: Never true    Ran Out of Food in the Last Year: Never true  Transportation Needs: No Transportation Needs  (07/08/2021)   PRAPARE - Hydrologist (Medical): No    Lack of Transportation (Non-Medical): No  Physical Activity: Not on file  Stress: Not on file  Social Connections: Not on file  Intimate Partner Violence: Not on file   No Known Allergies  Last set of Vital Signs (not current) Vitals:   01/31/22 0330 01/31/22 0400  BP: (!) 151/133 (!) 171/155  Pulse:    Resp: (!) 36 (!) 48  Temp:    SpO2:        Physical Exam  Gen: unresponsive Cardiovascular: pulseless  Resp: apneic.  Abd: nondistended  Neuro: GCS 3, unresponsive to pain  HEENT: No blood in posterior pharynx, gag reflex absent  Neck: No crepitus  Musculoskeletal: No deformity  Skin: warm  Procedures  INTUBATION Performed by: Nance Pear Required items: required blood products, implants, devices, and special equipment available Patient identity confirmed: provided demographic data and hospital-assigned identification number Time out: Immediately prior to procedure a "time out" was called to verify the correct patient, procedure, equipment, support staff and site/side marked as required. Indications: cardiopulmonary arrest Intubation method: Glidescope Preoxygenation: BVM Post-procedure assessment: chest rise and ETCO2 monitor Breath sounds: equal and absent over the epigastrium Tube secured by Respiratory Therapy Patient tolerated the procedure well with no immediate complications.  Medical Decision making  Patient in cardiopulmonary arrest. Patient was intubated.     Nance Pear, MD 01/31/22 [phone]

## 2022-01-31 NOTE — Consult Note (Signed)
[facility]  CONSULT NOTE    Date: 01/31/2022                  Patient Name:  Gerald [name]  MRN: [id number]  DOB: 1980-11-17  Age / Sex: 41 y.o., male         PCP: Patient, [name]                 Service Requesting Consult: Critical Care                 Reason for Consult: Acute kidney injury            History of Present Illness: Gerald [name] is a 41 y.o.  male with past medical history including chronic combined CHF, hypertension, anemia, polysubstance abuse, and chronic kidney disease stage III, who was admitted to [facility] on 01/26/2022 for Acute respiratory failure ([place]) [J96.00] Acute respiratory failure with hypoxia ([place]) [J96.01] Atrial fibrillation with RVR ([place]) [I48.91] Community acquired pneumonia of right lung, unspecified part of lung [J18.9] Acute on chronic congestive heart failure, unspecified heart failure type ([place]) [I50.9] Acute respiratory failure with hypoxemia ([place]) [J96.01]  Patient presented to the emergency department with progressive shortness of breath.  Patient has been admitted to ICU.  Patient was placed on BiPAP in emergency department for hypoxia however patient vomited and was placed on high flow oxygen.  Patient given IV Lasix and antibiotics.  Patient is currently intubated and sedated, unable to participate with visit.  No family at bedside.  Sedation propofol and fentanyl Intubated with vent settings 50% FiO2 with 15 PEEP Cardiac amiodarone and heparin drip Pure wick  Labs on ED arrival include sodium 137, potassium 3.4, serum bicarb 21, glucose 113, BUN 39, and creatinine 2.43 with GFR 33.  BNP 793.  Troponins 553.  Respiratory panel negative for influenza and COVID-19.  Chest x-ray shows patchy opacities within the right lung possibly pneumonia.   Medications: Outpatient medications: Medications Prior to Admission  Medication Sig Dispense Refill Last Dose   amLODipine (NORVASC) 5 MG tablet Take 2 tablets  (10 mg total) by mouth once daily. 60 tablet 2 Past Week at unknown   atorvastatin (LIPITOR) 40 MG tablet Take 1 tablet (40 mg total) by mouth once daily. 30 tablet 2 Past Week at unknown   carvedilol (COREG) 12.5 MG tablet Take 1 tablet (12.5 mg total) by mouth 2 (two) times daily with a meal. 60 tablet 2 Past Week at unknown   cyclobenzaprine (FLEXERIL) 5 MG tablet Take 5 mg by mouth 3 (three) times daily as needed.   Past Week at unknown   dapagliflozin propanediol (FARXIGA) 10 MG TABS tablet Take 1 tablet (10 mg total) by mouth once daily before breakfast. 30 tablet 2 Past Week at unknown   FEROSUL 325 (65 Fe) MG tablet Take 325 mg by mouth daily.   Past Week at unknown   hydrALAZINE (APRESOLINE) 100 MG tablet Take 1 tablet (100 mg total) by mouth 3 (three) times daily. (Patient taking differently: Take 50 mg by mouth 3 (three) times daily.) 90 tablet 2 Past Week at unknown   lidocaine (LIDODERM) 5 % Place 1 patch onto the skin daily. Place 1 patch on the skin daily. Apply to affected area for 12 hours only each day (then remove patch)   Past Week at unknown   losartan (COZAAR) 25 MG tablet Take 1 tablet (25 mg total) by mouth once daily. [place]  tablet 2 Past Week at unknown   nicotine (NICODERM CQ - DOSED IN MG/24 HOURS) 21 mg/24hr patch Place 1 patch onto the skin daily.   Past Week at unknown   nicotine polacrilex (NICORETTE) 4 MG gum Place 1 each inside cheek every hour as needed. Chew 1 piece (4 mg total) and park in cheek every hour as needed for smoking cessation. 01/03/2022 02/02/2022 [facility] 01/03/2022   Past Week at unknown   spironolactone (ALDACTONE) 50 MG tablet Take 50 mg by mouth daily.   Past Week at unknown   torsemide (DEMADEX) 20 MG tablet Take 1 tablet (20 mg total) by mouth once daily. 30 tablet 2 Past Week at unknown   isosorbide mononitrate (IMDUR) 60 MG 24 hr tablet Take 2 tablets (120 mg total) by mouth once daily. 60 tablet 0    potassium chloride (KLOR-CON) 10 MEQ tablet  Take 1 tablet (10 mEq total) by mouth once daily. (Patient not taking: Reported on 02/05/2022) 30 tablet 2 Not Taking   rivaroxaban (XARELTO) 20 MG TABS tablet Take 1 tablet (20 mg total) by mouth once daily with supper. 30 tablet 2     Current medications: Current Facility-Administered Medications  Medication Dose Route Frequency Provider Last Rate Last Admin   acetaminophen (TYLENOL) tablet 650 mg  650 mg Oral Q6H PRN Foust, Katy L, NP   650 mg at 01/31/22 0902   amiodarone (NEXTERONE PREMIX) 360-4.14 MG/200ML-% (1.8 mg/mL) IV infusion  60 mg/hr Intravenous Continuous Minna Merritts, MD 33.3 mL/hr at 01/31/22 1505 60 mg/hr at 01/31/22 1505   [START ON 02/01/2022] atorvastatin (LIPITOR) tablet 40 mg  40 mg Per Tube Daily Benita Gutter, RPH       ceFEPIme (MAXIPIME) 2 g in sodium chloride 0.9 % 100 mL IVPB  2 g Intravenous Q12H Benita Gutter, RPH   Stopped at 01/31/22 1032   Chlorhexidine Gluconate Cloth 2 % PADS 6 each  6 each Topical Q0600 Rise Patience, MD   6 each at 01/31/22 0827   famotidine (PEPCID) IVPB 20 mg premix  20 mg Intravenous Q12H Teressa Lower, NP       feeding supplement (PROSource TF) liquid 45 mL  45 mL Per Tube TID Etheleen Nicks, MD       feeding supplement (VITAL HIGH PROTEIN) liquid 1,000 mL  1,000 mL Per Tube Continuous Etheleen Nicks, MD 60 mL/hr at 01/31/22 1505 1,000 mL at 01/31/22 1505   fentaNYL 2548mcg in NS 259mL (86mcg/ml) infusion-PREMIX  50-200 mcg/hr Intravenous Continuous Lang Snow, NP 5 mL/hr at 01/31/22 1400 50 mcg/hr at 01/31/22 1400   free water 30 mL  30 mL Per Tube Q4H Etheleen Nicks, MD   30 mL at 01/31/22 1505   furosemide (LASIX) 200 mg in dextrose 5 % 100 mL (2 mg/mL) infusion  20 mg/hr Intravenous Continuous Etheleen Nicks, MD 10 mL/hr at 01/31/22 1617 20 mg/hr at 01/31/22 1617   guaiFENesin-dextromethorphan (ROBITUSSIN DM) 100-10 MG/5ML syrup 5 mL  5 mL Oral Q4H PRN Wyvonnia Dusky, MD   5 mL at 01/31/22 0328    heparin ADULT infusion 100 units/mL (25000 units/251mL)  1,850 Units/hr Intravenous Continuous Renda Rolls, RPH 18.5 mL/hr at 01/31/22 1400 1,850 Units/hr at 01/31/22 1400   lidocaine (LIDODERM) 5 % 1 patch  1 patch Transdermal Q24H Foust, Katy L, NP   1 patch at 02/09/2022 0653   linezolid (ZYVOX) IVPB 600 mg  600 mg Intravenous  Q12H Teressa Lower, NP   Stopped at 01/31/22 1233   metoprolol tartrate (LOPRESSOR) tablet 12.5 mg  12.5 mg Per Tube Q6H Benita Gutter, RPH   12.5 mg at 01/31/22 1618   metroNIDAZOLE (FLAGYL) IVPB 500 mg  500 mg Intravenous Q12H Lang Snow, NP   Stopped at 01/31/22 1103   morphine (PF) 2 MG/ML injection 2 mg  2 mg Intravenous Q4H PRN Lang Snow, NP   2 mg at 01/31/22 0330   phenylephrine (NEO-SYNEPHRINE) 20mg /NS 242mL premix infusion  0-400 mcg/min Intravenous Titrated Etheleen Nicks, MD       propofol (DIPRIVAN) 1000 MG/100ML infusion  0-50 mcg/kg/min Intravenous Continuous Lang Snow, NP 15.78 mL/hr at 01/31/22 1623 20 mcg/kg/min at 01/31/22 1623      Allergies: No Known Allergies    Past Medical History: Past Medical History:  Diagnosis Date   Acute on chronic combined systolic and diastolic CHF (congestive heart failure) ([name])    Acute on chronic systolic CHF (congestive heart failure) ([place]) 64/33/2951   Acute systolic congestive heart failure ([name])    Atrial flutter, paroxysmal ([name])    CHF (congestive heart failure) ([place]) 03/30/2021   Chronic combined systolic and diastolic CHF (congestive heart failure) ([name])    Chronic kidney disease, stage III (moderate) ([name])    Dysrhythmia    brief episode afib, cardioverted did not return   Hypertension    Hypertensive crisis 01/20/2021   Hypertensive emergency 12/21/2020   NSTEMI (non-ST elevated myocardial infarction) ([place]) 12/21/2015   Polysubstance abuse ([place])      Past Surgical History: Past Surgical History:  Procedure Laterality Date   KNEE SURGERY Right    LEFT  HEART CATH AND CORONARY ANGIOGRAPHY N/A 12/21/2020   Procedure: LEFT HEART CATH AND CORONARY ANGIOGRAPHY;  Surgeon: Martinique, Peter M, MD;  Location: [place] [facility];  Service: Cardiovascular;  Laterality: N/A;   TEE WITHOUT CARDIOVERSION N/A 04/04/2019   Procedure: TRANSESOPHAGEAL ECHOCARDIOGRAM (TEE);  Surgeon: [name], MD;  Location: [facility] OR[facility];  Service: Cardiovascular;  Laterality: N/A;     Family History: Family History  Problem Relation Age of Onset   Hypertension Father    Heart disease Paternal Grandfather      Social History: Social History   Socioeconomic History   Marital status: Single    Spouse name: Not on file   Number of children: 3   Years of education: Not on file   Highest education level: 11th grade  Occupational History   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   Sexual activity: Yes    Partners: Female    Birth control/protection: Condom  Other Topics Concern   Not on file  Social History Narrative   Not on file   Social Determinants of Health   Financial Resource Strain: High Risk (07/08/2021)   Overall Financial Resource Strain (CARDIA)  Difficulty of Paying Living Expenses: Hard  Food Insecurity: No Food Insecurity (07/08/2021)   Hunger Vital Sign    Worried About Running Out of Food in the Last Year: Never true    Ran Out of Food in the Last Year: Never true  Transportation Needs: No Transportation Needs (07/08/2021)   PRAPARE - Radiographer, therapeutic (Medical): No    Lack of Transportation (Non-Medical): No  Physical Activity: Not on file  Stress: Not on file  Social Connections: Not on file  Intimate Partner Violence: Not on file     Review of Systems: Review of Systems  Unable to perform ROS: Intubated    Vital Signs: Blood pressure (!) 150/124, pulse 86, temperature 99.9 F (37.7 C), resp. rate (!) 29, height 6' (1.829 m), weight 131.5 kg, SpO2 98 %.  Weight trends: Filed Weights   02/04/2022 0016 01/20/2022 0300  Weight: (!) 140 kg 131.5 kg    Physical Exam: General: Sedated  Head: Normocephalic, atraumatic.   Eyes: Anicteric  Lungs:  Intubated on vent  Heart: Regular rate and rhythm  Abdomen:  Soft, nontender, obese  Extremities: No peripheral edema.  Neurologic: Sedated  Skin: No lesions  Access: Right femoral temp cath     Lab results: Basic Metabolic Panel: Recent Labs  Lab 01/17/2022 0023 01/25/2022 0638 01/31/22 0505 01/31/22 0802  NA 137 139 137 136  K 3.4* 3.6 3.5 4.1  CL 107 110 106 105  CO2 21* 20* 21* 22  GLUCOSE 113* 132* 133* 131*  BUN 39* 35* 31* 33*  CREATININE 2.43* 2.22* 2.44* 3.13*  CALCIUM 9.3 8.8* 8.8* 9.0  MG 2.3 2.1 2.1  --     Liver Function Tests: Recent Labs  Lab 01/27/2022 0023 02/11/2022 0638 01/31/22 0802  AST 21 21 21   ALT 34 32 26  ALKPHOS 84 80 64  BILITOT 1.5* 1.5* 0.8  PROT 8.0 7.6 6.8  ALBUMIN 3.7 3.4* 3.0*   No results for input(s): "LIPASE", "AMYLASE" in the last 168 hours. Recent Labs  Lab 01/31/22 0505  AMMONIA 23    CBC: Recent Labs  Lab 01/25/2022 0023 02/05/2022 0638 01/31/22 0505 01/31/22 0802  WBC 7.9 9.7 14.1* 12.7*  NEUTROABS 5.1 7.6  --  10.3*  HGB 12.0* 11.3* 11.9* 10.6*  HCT 40.5 37.2* 39.9 35.3*  MCV 68.9* 67.1* 68.7* 68.7*  PLT 233 229 213 218    Cardiac Enzymes: No results for input(s): "CKTOTAL", "CKMB", "CKMBINDEX", "TROPONINI" in the last 168 hours.  BNP: Invalid input(s): "POCBNP"  CBG: Recent Labs  Lab  01/31/2022 0254 01/31/22 0537  GLUCAP 125* 125*    Microbiology: Results for orders placed or performed during the hospital encounter of 01/26/2022  Blood culture (routine x 2)     Status: None (Preliminary result)   Collection Time: 02/08/2022  1:17 AM   Specimen: BLOOD  Result Value Ref Range Status   Specimen Description BLOOD LEFT HAND  Final   Special Requests   Final    BOTTLES DRAWN AEROBIC AND ANAEROBIC Blood Culture adequate volume   Culture   Final    NO GROWTH 1 DAY Performed at [facility], [address] [place], [place] [address]    Report Status PENDING  Incomplete  Resp Panel by RT-PCR (Flu A&B, Covid) Anterior Nasal Swab     Status: None   Collection Time: 02/05/2022  1:17 AM   Specimen: Anterior Nasal Swab  Result Value Ref Range Status   SARS  Coronavirus 2 by RT PCR NEGATIVE NEGATIVE Final    Comment: (NOTE) SARS-CoV-2 target nucleic acids are NOT DETECTED.  The SARS-CoV-2 RNA is generally detectable in upper respiratory specimens during the acute phase of infection. The lowest concentration of SARS-CoV-2 viral copies this assay can detect is 138 copies/mL. A negative result does not preclude SARS-Cov-2 infection and should not be used as the sole basis for treatment or other patient management decisions. A negative result may occur with  improper specimen collection/handling, submission of specimen other than nasopharyngeal swab, presence of viral mutation(s) within the areas targeted by this assay, and inadequate number of viral copies(<138 copies/mL). A negative result must be combined with clinical observations, patient history, and epidemiological information. The expected result is Negative.  Fact Sheet for Patients:  EntrepreneurPulse.com.au  Fact Sheet for Healthcare Providers:  IncredibleEmployment.be  This test is no t yet approved or cleared by the [place] FDA and  has been authorized for  detection and/or diagnosis of SARS-CoV-2 by FDA under an Emergency Use Authorization (EUA). This EUA will remain  in effect (meaning this test can be used) for the duration of the COVID-19 declaration under Section 564(b)(1) of the Act, 21 U.S.C.section 360bbb-3(b)(1), unless the authorization is terminated  or revoked sooner.       Influenza A by PCR NEGATIVE NEGATIVE Final   Influenza B by PCR NEGATIVE NEGATIVE Final    Comment: (NOTE) The Xpert Xpress SARS-CoV-2/FLU/RSV plus assay is intended as an aid in the diagnosis of influenza from Nasopharyngeal swab specimens and should not be used as a sole basis for treatment. Nasal washings and aspirates are unacceptable for Xpert Xpress SARS-CoV-2/FLU/RSV testing.  Fact Sheet for Patients: EntrepreneurPulse.com.au  Fact Sheet for Healthcare Providers: IncredibleEmployment.be  This test is not yet approved or cleared by the [place] FDA and has been authorized for detection and/or diagnosis of SARS-CoV-2 by FDA under an Emergency Use Authorization (EUA). This EUA will remain in effect (meaning this test can be used) for the duration of the COVID-19 declaration under Section 564(b)(1) of the Act, 21 U.S.C. section 360bbb-3(b)(1), unless the authorization is terminated or revoked.  Performed at [facility], [address] [place], [place] [address]   Blood culture (routine x 2)     Status: None (Preliminary result)   Collection Time: 01/16/2022  1:18 AM   Specimen: BLOOD  Result Value Ref Range Status   Specimen Description BLOOD LEFT ASSIST CONTROL  Final   Special Requests   Final    BOTTLES DRAWN AEROBIC AND ANAEROBIC Blood Culture adequate volume   Culture   Final    NO GROWTH 1 DAY Performed at [facility], [address], [place], [place] [address]    Report Status PENDING  Incomplete  MRSA Next Gen by PCR, Nasal     Status: None   Collection Time: 02/06/2022   3:01 AM   Specimen: Nasal Mucosa; Nasal Swab  Result Value Ref Range Status   MRSA by PCR Next Gen NOT DETECTED NOT DETECTED Final    Comment: (NOTE) The GeneXpert MRSA Assay (FDA approved for NASAL specimens only), is one component of a comprehensive MRSA colonization surveillance program. It is not intended to diagnose MRSA infection nor to guide or monitor treatment for MRSA infections. Test performance is not FDA approved in patients less than 61 years old. Performed at [facility], [address] [place], [place] [address]   Expectorated Sputum Assessment w Gram Stain, Rflx to Resp  Cult     Status: None   Collection Time: 02/08/2022  6:07 AM   Specimen: Expectorated Sputum  Result Value Ref Range Status   Specimen Description EXPECTORATED SPUTUM  Final   Special Requests NONE  Final   Sputum evaluation   Final    THIS SPECIMEN IS ACCEPTABLE FOR SPUTUM CULTURE Performed at [facility], [address], [place], [place] [address]    Report Status 02/06/2022 FINAL  Final  Culture, Respiratory w Gram Stain     Status: None (Preliminary result)   Collection Time: 01/28/2022  6:07 AM  Result Value Ref Range Status   Specimen Description   Final    EXPECTORATED SPUTUM Performed at [facility], [address], [place], [place] [address]    Special Requests   Final    NONE Reflexed from [id number] Performed at [facility], [address] [place], [place] [address]    Gram Stain   Final    RARE WBC PRESENT, PREDOMINANTLY PMN RARE GRAM POSITIVE COCCI IN PAIRS    Culture   Final    CULTURE REINCUBATED FOR BETTER GROWTH Performed at [facility], [place] [address] [place], [place] [address]    Report Status PENDING  Incomplete    Coagulation Studies: Recent Labs    02/08/2022 0028  LABPROT 17.8*  INR 1.5*    Urinalysis: Recent Labs    01/23/2022 0650  COLORURINE STRAW*  LABSPEC 1.006  PHURINE 5.0  GLUCOSEU NEGATIVE  HGBUR  NEGATIVE  BILIRUBINUR NEGATIVE  KETONESUR NEGATIVE  PROTEINUR NEGATIVE  NITRITE NEGATIVE  LEUKOCYTESUR NEGATIVE      Imaging: DG Chest 1 View  Result Date: 01/31/2022 CLINICAL DATA:  41 year old male status post endotracheal tube placement. EXAM: CHEST  1 VIEW COMPARISON:  Chest x-ray 01/21/2022. FINDINGS: Patient is now intubated, with the tip of the endotracheal tube 6.9 cm above the carina. A nasogastric tube is seen extending into the stomach, however, the tip of the nasogastric tube extends below the lower margin of the image. Transcutaneous defibrillator pad projecting over the mid thorax. Lung volumes remain low. Near complete opacification of the right hemithorax and extensive opacification in the left mid to lower lung, similar to the prior study, indicative of severe bilateral (right-greater-than-left) multilobar pneumonia. No pneumothorax. Moderate cardiomegaly. The patient is rotated to the right on today's exam, resulting in distortion of the mediastinal contours and reduced diagnostic sensitivity and specificity for mediastinal pathology. IMPRESSION: 1. Support apparatus, as above. 2. Severe multilobar bilateral pneumonia (right-greater-than-left) redemonstrated. Electronically Signed   By: Vinnie Langton M.D.   On: 01/31/2022 06:49   DG Chest 1 View  Result Date: 01/31/2022 CLINICAL DATA:  41 year old male with history of shortness of breath. EXAM: CHEST  1 VIEW COMPARISON:  Chest x-ray 01/19/2022. FINDINGS: Worsening airspace consolidation throughout the entire right lung. Areas of interstitial prominence and patchy ill-defined opacities are also noted in the left lung, most confluent in the left lung base. No definite pleural effusions. No pneumothorax. Pulmonary vasculature is obscured. Heart size is mildly enlarged. IMPRESSION: 1. Worsening multilobar bilateral pneumonia (right-greater-than-left), as above. Electronically Signed   By: Vinnie Langton M.D.   On: 01/31/2022 05:34    DG Chest Port 1 View  Result Date: 01/21/2022 CLINICAL DATA:  Shortness of breath EXAM: PORTABLE CHEST 1 VIEW COMPARISON:  Chest x-ray dated January 30, 2022 FINDINGS: Unchanged cardiomegaly. Increased mid and lower lung predominant heterogeneous opacities. No definite pleural effusion. No evidence of pneumothorax. IMPRESSION:  Increased heterogeneous opacities of the right lung, concerning for worsening infection. Electronically Signed   By: Yetta Glassman M.D.   On: 02/05/2022 11:12   DG Chest Port 1 View  Result Date: 01/27/2022 CLINICAL DATA:  41 year old male with history of shortness of breath, pneumonia and hemoptysis. EXAM: PORTABLE CHEST 1 VIEW COMPARISON:  Chest x-ray 01/27/2022. FINDINGS: Extensive areas of ill-defined airspace consolidation noted throughout the periphery of the right lung, most evident in the right mid to lower lung. No pleural effusions. No pneumothorax. Cephalization of the pulmonary vasculature. No frank pulmonary edema. Heart size is moderately enlarged. Upper mediastinal contours are unremarkable. IMPRESSION: 1. Severe multilobar pneumonia in the right lung, similar to the recent prior study. 2. Cardiomegaly with pulmonary venous congestion, but no frank pulmonary edema. Electronically Signed   By: Vinnie Langton M.D.   On: 02/06/2022 05:14   DG Chest Portable 1 View  Result Date: 02/10/2022 CLINICAL DATA:  Shortness of breath EXAM: PORTABLE CHEST 1 VIEW COMPARISON:  11/02/2021 FINDINGS: Cardiomegaly. Patchy airspace disease throughout the right lung. No confluent opacity on the left. No effusions or acute bony abnormality. IMPRESSION: Patchy opacities within the right lung concerning for pneumonia. Electronically Signed   By: Rolm Baptise M.D.   On: 02/09/2022 00:56     Assessment & Plan: Gerald [name] is a 41 y.o.  male with ast medical history including chronic combined CHF, hypertension, anemia, polysubstance abuse, and chronic kidney disease stage III,  who was admitted to [facility] on 01/29/2022 for Acute respiratory failure ([place]) [J96.00] Acute respiratory failure with hypoxia ([place]) [J96.01] Atrial fibrillation with RVR ([place]) [I48.91] Community acquired pneumonia of right lung, unspecified part of lung [J18.9] Acute on chronic congestive heart failure, unspecified heart failure type ([place]) [I50.9] Acute respiratory failure with hypoxemia ([place]) [J96.01]  Acute kidney injury on chronic kidney disease stage IIIb with baseline creatinine 2.03 with GFR 42 on 11/04/2021.No IV contrast exposure.  Will order renal ultrasound to evaluate obstruction.  Current creatinine 3.13.  Patient has received furosemide for pulmonary congestion.  Currently placed on furosemide drip at 6 mg/h.  Urine output 2.4 L recorded in previous 24 hours.  No peripheral edema noted on exam.  We will consider stopping furosemide drip tomorrow if creatinine continues to rise unless needed for continued pulmonary congestion.  Continue to avoid nephrotoxic agents and therapies.  No acute need for dialysis at this time but will monitor closely.  2. Anemia of chronic kidney disease Lab Results  Component Value Date   HGB 10.6 (L) 01/31/2022    Hemoglobin within acceptable range  3. Secondary Hyperparathyroidism:  Lab Results  Component Value Date   CALCIUM 9.0 01/31/2022   PHOS 4.1 09/05/2021    Calcium and phosphorus within acceptable range.  We will continue to monitor  4.  Chronic combined systolic and diastolic heart failure.  Echo on 09/05/2021 shows EF 20 to 25% with severe LVH and a grade 3 diastolic dysfunction.  LOS: 1   6/16/20234:26 PM

## 2022-01-31 NOTE — Procedures (Signed)
Name: [name] MRN: [id number] DOB: May 16, 1981  DOS:  PROCEDURE NOTE  Procedure:  Arterial catheter placement.  Indications:  Need for invasive hemodynamic monitoring / frequent arterial blood gases measurement.  Consent:  Consent was implied due to the emergency nature of the procedure.  Procedure summary:  The patient was identified as Gerald [name] and safety timeout was performed. Collateral arterial flow was confirmed by performing Allen's test. Sterile technique was used. The patient's right brachial was prepped using chlorhexidine / alcohol scrub and the field was draped in usual sterile fashion with protective barrier. The right brachial artery was cannulated without difficulty. Blood was aspirated and the catheter was flushed with normal saline without difficulty. Good arterial waveform was obtained. The catheter was secured into place with sterile dressing.  Complications:  No immediate complications were noted.  Estimated blood loss:  Less then 5 mL   01/31/2022, 7:50 AM

## 2022-02-01 ENCOUNTER — Inpatient Hospital Stay: Payer: Medicaid Other

## 2022-02-01 LAB — CBC WITH DIFFERENTIAL/PLATELET
Abs Immature Granulocytes: 0.07 10*3/uL (ref 0.00–0.07)
Basophils Absolute: 0.1 10*3/uL (ref 0.0–0.1)
Basophils Relative: 1 %
Eosinophils Absolute: 0.1 10*3/uL (ref 0.0–0.5)
Eosinophils Relative: 1 %
HCT: 35.1 % — ABNORMAL LOW (ref 39.0–52.0)
Hemoglobin: 10.3 g/dL — ABNORMAL LOW (ref 13.0–17.0)
Immature Granulocytes: 1 %
Lymphocytes Relative: 16 %
Lymphs Abs: 1.8 10*3/uL (ref 0.7–4.0)
MCH: 20.5 pg — ABNORMAL LOW (ref 26.0–34.0)
MCHC: 29.3 g/dL — ABNORMAL LOW (ref 30.0–36.0)
MCV: 69.8 fL — ABNORMAL LOW (ref 80.0–100.0)
Monocytes Absolute: 1 10*3/uL (ref 0.1–1.0)
Monocytes Relative: 8 %
Neutro Abs: 8.8 10*3/uL — ABNORMAL HIGH (ref 1.7–7.7)
Neutrophils Relative %: 73 %
Platelets: 195 10*3/uL (ref 150–400)
RBC: 5.03 MIL/uL (ref 4.22–5.81)
RDW: 18.6 % — ABNORMAL HIGH (ref 11.5–15.5)
WBC: 11.8 10*3/uL — ABNORMAL HIGH (ref 4.0–10.5)
nRBC: 0 % (ref 0.0–0.2)

## 2022-02-01 LAB — BLOOD GAS, ARTERIAL
Acid-base deficit: 7.7 mmol/L — ABNORMAL HIGH (ref 0.0–2.0)
Bicarbonate: 18.8 mmol/L — ABNORMAL LOW (ref 20.0–28.0)
FIO2: 0.7 %
MECHVT: 550 mL
O2 Saturation: 95.6 %
PEEP: 10 cmH2O
Patient temperature: 37
RATE: 20 resp/min
pCO2 arterial: 41 mmHg (ref 32–48)
pH, Arterial: 7.27 — ABNORMAL LOW (ref 7.35–7.45)
pO2, Arterial: 76 mmHg — ABNORMAL LOW (ref 83–108)

## 2022-02-01 LAB — GLUCOSE, CAPILLARY
Glucose-Capillary: 88 mg/dL (ref 70–99)
Glucose-Capillary: 94 mg/dL (ref 70–99)
Glucose-Capillary: 76 mg/dL (ref 70–99)
Glucose-Capillary: 90 mg/dL (ref 70–99)
Glucose-Capillary: 124 mg/dL — ABNORMAL HIGH (ref 70–99)
Glucose-Capillary: 121 mg/dL — ABNORMAL HIGH (ref 70–99)
Glucose-Capillary: 138 mg/dL — ABNORMAL HIGH (ref 70–99)

## 2022-02-01 LAB — HEPARIN LEVEL (UNFRACTIONATED): Heparin Unfractionated: 0.55 IU/mL (ref 0.30–0.70)

## 2022-02-01 LAB — COMPREHENSIVE METABOLIC PANEL
ALT: 270 U/L — ABNORMAL HIGH (ref 0–44)
AST: 256 U/L — ABNORMAL HIGH (ref 15–41)
Albumin: 2.6 g/dL — ABNORMAL LOW (ref 3.5–5.0)
Alkaline Phosphatase: 63 U/L (ref 38–126)
Anion gap: 12 (ref 5–15)
BUN: 45 mg/dL — ABNORMAL HIGH (ref 6–20)
CO2: 20 mmol/L — ABNORMAL LOW (ref 22–32)
Calcium: 8.6 mg/dL — ABNORMAL LOW (ref 8.9–10.3)
Chloride: 104 mmol/L (ref 98–111)
Creatinine, Ser: 4.77 mg/dL — ABNORMAL HIGH (ref 0.61–1.24)
GFR, Estimated: 15 mL/min — ABNORMAL LOW (ref >60)
Glucose, Bld: 97 mg/dL (ref 70–99)
Potassium: 4.4 mmol/L (ref 3.5–5.1)
Sodium: 136 mmol/L (ref 135–145)
Total Bilirubin: 1.2 mg/dL (ref 0.3–1.2)
Total Protein: 6.4 g/dL — ABNORMAL LOW (ref 6.5–8.1)

## 2022-02-01 LAB — MAGNESIUM: Magnesium: 2.2 mg/dL (ref 1.7–2.4)

## 2022-02-01 LAB — CULTURE, RESPIRATORY W GRAM STAIN: Culture: NORMAL

## 2022-02-01 LAB — APTT
aPTT: 75 seconds — ABNORMAL HIGH (ref 24–36)
aPTT: 81 seconds — ABNORMAL HIGH (ref 24–36)

## 2022-02-01 LAB — TRIGLYCERIDES: Triglycerides: 68 mg/dL (ref <150)

## 2022-02-01 MED ORDER — ORAL CARE MOUTH RINSE
15.0000 mL | OROMUCOSAL | Status: DC
  Administered 2022-02-02 – 2022-02-09 (×93): 15 mL via OROMUCOSAL

## 2022-02-01 MED ORDER — SODIUM CHLORIDE 0.9 % FOR CRRT: INTRAVENOUS_CENTRAL | Status: DC | PRN

## 2022-02-01 MED ORDER — ORAL CARE MOUTH RINSE: 15.0000 mL | OROMUCOSAL | Status: DC | PRN

## 2022-02-01 MED ORDER — PRISMASOL BGK 0/2.5 32-2.5 MEQ/L EC SOLN
Status: DC
  Filled 2022-02-01 (×4): qty 5000

## 2022-02-01 MED ORDER — HEPARIN SODIUM (PORCINE) 1000 UNIT/ML DIALYSIS
1000.0000 [IU] | INTRAMUSCULAR | Status: DC | PRN
  Administered 2022-02-04: 3000 [IU] via INTRAVENOUS_CENTRAL
  Administered 2022-02-04: 1000 [IU] via INTRAVENOUS_CENTRAL
  Administered 2022-02-04: 3000 [IU] via INTRAVENOUS_CENTRAL
  Filled 2022-02-01: qty 6
  Filled 2022-02-01 (×2): qty 3
  Filled 2022-02-01 (×2): qty 6
  Filled 2022-02-01: qty 3

## 2022-02-01 MED ORDER — PRISMASOL BGK 0/2.5 32-2.5 MEQ/L EC SOLN
Status: DC
  Filled 2022-02-01 (×16): qty 5000

## 2022-02-01 MED ORDER — SODIUM CHLORIDE 0.9 % IV SOLN
2.0000 g | INTRAVENOUS | Status: DC
  Administered 2022-02-01: 2 g via INTRAVENOUS
  Filled 2022-02-01: qty 2

## 2022-02-01 NOTE — Progress Notes (Signed)
ANTICOAGULATION CONSULT NOTE   Pharmacy Consult for Heparin Infusion Indication: ACS/STEMI  Patient Measurements: Height: 6' (182.9 cm) Weight: 132.6 kg (292 lb 5.3 oz) IBW/kg (Calculated) : 77.6 Heparin Dosing Weight: 109.9 kg  Labs: Recent Labs    01/23/2022 0028 01/23/2022 0153 01/29/2022 0537 01/25/2022 9030 01/23/2022 1343 01/31/22 0505 01/31/22 0802 01/31/22 1221 01/31/22 1803 02/01/22 0126 02/01/22 0321 02/01/22 0755  HGB  --   --   --  11.3*  --  11.9* 10.6*  --   --   --  10.3*  --   HCT  --   --   --  37.2*  --  39.9 35.3*  --   --   --  35.1*  --   PLT  --   --   --  229  --  213 218  --   --   --  195  --   APTT 50*  --   --  70*   < > 59*  --    < > 64* 75*  --  81*  LABPROT 17.8*  --   --   --   --   --   --   --   --   --   --   --   INR 1.5*  --   --   --   --   --   --   --   --   --   --   --   HEPARINUNFRC  --  >1.10*  --   --   --  1.07*  --   --   --   --   --  0.55  CREATININE  --   --   --  2.22*  --  2.44* 3.13*  --   --   --  4.77*  --   TROPONINIHS  --  472* 500* 450*  --   --   --   --   --   --   --   --    < > = values in this interval not displayed.     Estimated Creatinine Clearance: 28.7 mL/min (A) (by C-G formula based on SCr of 4.77 mg/dL (H)).   Medical History: Past Medical History:  Diagnosis Date   Acute on chronic combined systolic and diastolic CHF (congestive heart failure) ([name])    Acute on chronic systolic CHF (congestive heart failure) ([place]) 05/10/3006   Acute systolic congestive heart failure ([name])    Atrial flutter, paroxysmal ([name])    CHF (congestive heart failure) ([place]) 03/30/2021   Chronic combined systolic and diastolic CHF (congestive heart failure) ([name])    Chronic kidney disease, stage III (moderate) ([name])    Dysrhythmia    brief episode afib, cardioverted did not return   Hypertension    Hypertensive crisis 01/20/2021   Hypertensive emergency 12/21/2020   NSTEMI (non-ST elevated myocardial infarction) ([place]) 12/21/2015    Polysubstance abuse ([place])     Medications:  PTA meds include Xarelto 20 mg daily.  Last dose unknown.  Checking baseline HL, aPTT, & INR  Assessment: Pt is 41 yo male presenting to ED c/o SOB, found w/ elevated BNP and Troponin I lvls.  Goal of Therapy:  aPTT goal: 66-102 Heparin level 0.3-0.7 units/ml Monitor platelets by anticoagulation protocol: Yes  Date Time aPTT/HL Rate/Comment 6/16 1221 76/---  Therapeutic x1 6/16 1803 64/---  Subtherapeutic 6/17 0126 75/--  Therapeutic x 1 6/17 0755 81/0.55 Therapeutic.   Plan:  Heparin level and aPTT therapeutic. Starting to correlate. Will continue heparin infusion at 2000 units/hr. Recheck aPTT/HR/CBC with AM labs.  Switch to heparin level if correlation between aPTT and heparin level is confirmed  Eleonore Chiquito, PharmD, 02/01/2022 8:23 AM

## 2022-02-01 NOTE — Progress Notes (Signed)
[place], [place] 02/01/22  Subjective:   Hospital day # 2 Patient remains critically ill. Neuro: Sedated with propofol and fentanyl  cvs: Requiring phenylephrine, amiodarone.  Atrial fibrillation pulm: Ventilator assisted.  FiO2 40% gi: Tube feeds at 50 cc/h. Renal: Foley in place with minimal urine output. 06/16 0701 - 06/17 0700 In: 4522.3 [I.V.:2547.8; NG/GT:824.5; IV Piggyback:1150] Out: 280 [Urine:280] Lab Results  Component Value Date   CREATININE 4.77 (H) 02/01/2022   CREATININE 3.13 (H) 01/31/2022   CREATININE 2.44 (H) 01/31/2022     Objective:  Vital signs in last 24 hours:  Temp:  [99.5 F (37.5 C)-100.4 F (38 C)] 100.4 F (38 C) (06/17 1030) Pulse Rate:  [51-104] 65 (06/17 1030) Resp:  [20-30] 20 (06/17 1030) BP: (83-160)/(55-138) 119/95 (06/17 1000) SpO2:  [86 %-99 %] 88 % (06/17 1030) Arterial Line BP: (83-128)/(59-78) 119/67 (06/17 1030) FiO2 (%):  [40 %-50 %] 40 % (06/17 0747) Weight:  [132.6 kg] 132.6 kg (06/17 0337)  Weight change:  Filed Weights   01/18/2022 0016 01/21/2022 0300 02/01/22 0337  Weight: (!) 140 kg 131.5 kg 132.6 kg    Intake/Output:    Intake/Output Summary (Last 24 hours) at 02/01/2022 1056 Last data filed at 02/01/2022 1000 Gross per 24 hour  Intake 5062.55 ml  Output 318 ml  Net 4744.55 ml     Physical Exam: General: Critically ill-appearing, laying in the bed  HEENT ET tube, OG tube in place  Pulm/lungs Ventilator assisted  CVS/Heart Atrial fibrillation, irregular  Abdomen:  Soft  Extremities: Some dependent edema present  Neurologic: Sedated  Skin: Warm, dry  Access: Right femoral dialysis catheter in place       Basic Metabolic Panel:  Recent Labs  Lab 02/06/2022 0023 02/13/2022 0638 01/31/22 0505 01/31/22 0802 02/01/22 0321  NA 137 139 137 136 136  K 3.4* 3.6 3.5 4.1 4.4  CL 107 110 106 105 104  CO2 21* 20* 21* 22 20*  GLUCOSE 113* 132* 133* 131* 97  BUN 39* 35* 31* 33* 45*   CREATININE 2.43* 2.22* 2.44* 3.13* 4.77*  CALCIUM 9.3 8.8* 8.8* 9.0 8.6*  MG 2.3 2.1 2.1  --  2.2     CBC: Recent Labs  Lab 01/20/2022 0023 02/01/2022 0638 01/31/22 0505 01/31/22 0802 02/01/22 0321  WBC 7.9 9.7 14.1* 12.7* 11.8*  NEUTROABS 5.1 7.6  --  10.3* 8.8*  HGB 12.0* 11.3* 11.9* 10.6* 10.3*  HCT 40.5 37.2* 39.9 35.3* 35.1*  MCV 68.9* 67.1* 68.7* 68.7* 69.8*  PLT 233 229 213 218 195     No results found for: "HEPBSAG", "HEPBSAB", "HEPBIGM"    Microbiology:  Recent Results (from the past 240 hour(s))  Blood culture (routine x 2)     Status: None (Preliminary result)   Collection Time: 01/23/2022  1:17 AM   Specimen: BLOOD  Result Value Ref Range Status   Specimen Description BLOOD LEFT HAND  Final   Special Requests   Final    BOTTLES DRAWN AEROBIC AND ANAEROBIC Blood Culture adequate volume   Culture   Final    NO GROWTH 2 DAYS Performed at [facility], [address] [place], [place] [address]    Report Status PENDING  Incomplete  Resp Panel by RT-PCR (Flu A&B, Covid) Anterior Nasal Swab     Status: None   Collection Time: 02/08/2022  1:17 AM   Specimen: Anterior Nasal Swab  Result Value Ref Range Status   SARS Coronavirus 2 by RT PCR  NEGATIVE NEGATIVE Final    Comment: (NOTE) SARS-CoV-2 target nucleic acids are NOT DETECTED.  The SARS-CoV-2 RNA is generally detectable in upper respiratory specimens during the acute phase of infection. The lowest concentration of SARS-CoV-2 viral copies this assay can detect is 138 copies/mL. A negative result does not preclude SARS-Cov-2 infection and should not be used as the sole basis for treatment or other patient management decisions. A negative result may occur with  improper specimen collection/handling, submission of specimen other than nasopharyngeal swab, presence of viral mutation(s) within the areas targeted by this assay, and inadequate number of viral copies(<138 copies/mL). A negative result must  be combined with clinical observations, patient history, and epidemiological information. The expected result is Negative.  Fact Sheet for Patients:  EntrepreneurPulse.com.au  Fact Sheet for Healthcare Providers:  IncredibleEmployment.be  This test is no t yet approved or cleared by the [place] FDA and  has been authorized for detection and/or diagnosis of SARS-CoV-2 by FDA under an Emergency Use Authorization (EUA). This EUA will remain  in effect (meaning this test can be used) for the duration of the COVID-19 declaration under Section 564(b)(1) of the Act, 21 U.S.C.section 360bbb-3(b)(1), unless the authorization is terminated  or revoked sooner.       Influenza A by PCR NEGATIVE NEGATIVE Final   Influenza B by PCR NEGATIVE NEGATIVE Final    Comment: (NOTE) The Xpert Xpress SARS-CoV-2/FLU/RSV plus assay is intended as an aid in the diagnosis of influenza from Nasopharyngeal swab specimens and should not be used as a sole basis for treatment. Nasal washings and aspirates are unacceptable for Xpert Xpress SARS-CoV-2/FLU/RSV testing.  Fact Sheet for Patients: EntrepreneurPulse.com.au  Fact Sheet for Healthcare Providers: IncredibleEmployment.be  This test is not yet approved or cleared by the [place] FDA and has been authorized for detection and/or diagnosis of SARS-CoV-2 by FDA under an Emergency Use Authorization (EUA). This EUA will remain in effect (meaning this test can be used) for the duration of the COVID-19 declaration under Section 564(b)(1) of the Act, 21 U.S.C. section 360bbb-3(b)(1), unless the authorization is terminated or revoked.  Performed at [facility], [address] [place], [place] [address]   Blood culture (routine x 2)     Status: None (Preliminary result)   Collection Time: 01/27/2022  1:18 AM   Specimen: BLOOD  Result Value Ref Range Status    Specimen Description BLOOD LEFT ASSIST CONTROL  Final   Special Requests   Final    BOTTLES DRAWN AEROBIC AND ANAEROBIC Blood Culture adequate volume   Culture   Final    NO GROWTH 2 DAYS Performed at [facility], [address] [place], [place] [address]    Report Status PENDING  Incomplete  MRSA Next Gen by PCR, Nasal     Status: None   Collection Time: 01/21/2022  3:01 AM   Specimen: Nasal Mucosa; Nasal Swab  Result Value Ref Range Status   MRSA by PCR Next Gen NOT DETECTED NOT DETECTED Final    Comment: (NOTE) The GeneXpert MRSA Assay (FDA approved for NASAL specimens only), is one component of a comprehensive MRSA colonization surveillance program. It is not intended to diagnose MRSA infection nor to guide or monitor treatment for MRSA infections. Test performance is not FDA approved in patients less than 69 years old. Performed at [facility], [address] [place], [place] [address]   Expectorated Sputum Assessment w Gram Stain, Rflx to Resp Cult  Status: None   Collection Time: 02/03/2022  6:07 AM   Specimen: Expectorated Sputum  Result Value Ref Range Status   Specimen Description EXPECTORATED SPUTUM  Final   Special Requests NONE  Final   Sputum evaluation   Final    THIS SPECIMEN IS ACCEPTABLE FOR SPUTUM CULTURE Performed at [facility], [address], [place], [place] [address]    Report Status 01/29/2022 FINAL  Final  Culture, Respiratory w Gram Stain     Status: None   Collection Time: 01/26/2022  6:07 AM  Result Value Ref Range Status   Specimen Description   Final    EXPECTORATED SPUTUM Performed at [facility], [address] [place], [place] [address]    Special Requests   Final    NONE Reflexed from [phone] Performed at [facility], [address] [place], [place] [address]    Gram Stain   Final    RARE WBC PRESENT, PREDOMINANTLY PMN RARE GRAM POSITIVE COCCI IN PAIRS    Culture   Final    FEW  Normal respiratory flora-no Staph aureus or Pseudomonas seen Performed at [facility], [place] [address], [place], [place] [address]    Report Status 02/01/2022 FINAL  Final    Coagulation Studies: Recent Labs    01/21/2022 0028  LABPROT 17.8*  INR 1.5*    Urinalysis: Recent Labs    02/11/2022 0650  COLORURINE STRAW*  LABSPEC 1.006  PHURINE 5.0  GLUCOSEU NEGATIVE  HGBUR NEGATIVE  BILIRUBINUR NEGATIVE  KETONESUR NEGATIVE  PROTEINUR NEGATIVE  NITRITE NEGATIVE  LEUKOCYTESUR NEGATIVE      Imaging: DG Chest Port 1 View  Result Date: 02/01/2022 CLINICAL DATA:  Endotracheal tube placement. EXAM: PORTABLE CHEST 1 VIEW COMPARISON:  02/01/2022 FINDINGS: Endotracheal tube has tip 6.6 cm above the carina. Enteric tube courses into the region of the stomach and off the film as tip is not visualized. Lungs are hypoinflated with persistent slight elevation of the right hemidiaphragm. Mild patchy hazy density over the right lung unchanged which may be due to asymmetric edema or infection. Mild stable hazy density left retrocardiac region. Stable cardiomegaly. Remainder of the exam is unchanged. IMPRESSION: 1. Hypoinflation with stable patchy hazy density over the right lung and left retrocardiac region. Stable cardiomegaly. 2. Tubes and lines as described. Electronically Signed   By: Marin Olp M.D.   On: 02/01/2022 10:48   DG Chest Port 1 View  Result Date: 02/01/2022 CLINICAL DATA:  Cardiac arrest. EXAM: PORTABLE CHEST 1 VIEW COMPARISON:  Portable chest yesterday at 6:38 a.m. FINDINGS: ETT tip is 5.3 cm from the carina, NGT adequately intragastric. The heart silhouette severely enlarged. There is perihilar vascular congestion central vascular prominence and patchy consolidation throughout the right lung fields and left lower lung field consistent with pneumonia, edema or combination and overlying small to moderate-sized pleural effusions. There is moderate interval clearing in the  periphery of the right lung compared with yesterday's exam with no other change in the lung opacities. There is no new or worsening abnormality. In all other respects no further changes. IMPRESSION: 1. Cardiomegaly and persistent central vascular prominence. 2. Patchy consolidation in the right lung fields, left lower lung field with improvement in aeration of the peripheral right lung fields and otherwise unchanged. 3. Bilateral pleural effusions. Electronically Signed   By: T[place]ord Nab M.D.   On: 02/01/2022 07:21   US RENAL  Result Date: 01/31/2022 CLINICAL DATA:  Acute kidney injury. EXAM: RENAL /  URINARY TRACT ULTRASOUND COMPLETE COMPARISON:  Renal ultrasound 04/06/2019 FINDINGS: Right Kidney: Renal measurements: 12.2 x 5.2 x 5.1 cm = volume: 167 mL. The renal parenchyma is hyperechoic to the index organ, the liver. No focal lesions are present. No stone or obstruction. Left Kidney: Renal measurements: 11.1 x 4.9 x 5.4 cm = volume: 156 mL. The renal parenchyma is hyperechoic to the index organ, the liver. No focal lesions are present. No stone or obstruction. Bladder: Not visualized Other: None. IMPRESSION: The kidneys are hyperechoic bilaterally without focal lesion or obstruction. This is nonspecific, but can be seen in the setting of medical renal disease. Echogenicity has increased since the prior exam. Electronically Signed   By: San Morelle M.D.   On: 01/31/2022 19:48   DG Chest 1 View  Result Date: 01/31/2022 CLINICAL DATA:  41 year old male status post endotracheal tube placement. EXAM: CHEST  1 VIEW COMPARISON:  Chest x-ray 01/21/2022. FINDINGS: Patient is now intubated, with the tip of the endotracheal tube 6.9 cm above the carina. A nasogastric tube is seen extending into the stomach, however, the tip of the nasogastric tube extends below the lower margin of the image. Transcutaneous defibrillator pad projecting over the mid thorax. Lung volumes remain low. Near complete  opacification of the right hemithorax and extensive opacification in the left mid to lower lung, similar to the prior study, indicative of severe bilateral (right-greater-than-left) multilobar pneumonia. No pneumothorax. Moderate cardiomegaly. The patient is rotated to the right on today's exam, resulting in distortion of the mediastinal contours and reduced diagnostic sensitivity and specificity for mediastinal pathology. IMPRESSION: 1. Support apparatus, as above. 2. Severe multilobar bilateral pneumonia (right-greater-than-left) redemonstrated. Electronically Signed   By: Vinnie Langton M.D.   On: 01/31/2022 06:49   DG Chest 1 View  Result Date: 01/31/2022 CLINICAL DATA:  41 year old male with history of shortness of breath. EXAM: CHEST  1 VIEW COMPARISON:  Chest x-ray 01/24/2022. FINDINGS: Worsening airspace consolidation throughout the entire right lung. Areas of interstitial prominence and patchy ill-defined opacities are also noted in the left lung, most confluent in the left lung base. No definite pleural effusions. No pneumothorax. Pulmonary vasculature is obscured. Heart size is mildly enlarged. IMPRESSION: 1. Worsening multilobar bilateral pneumonia (right-greater-than-left), as above. Electronically Signed   By: Vinnie Langton M.D.   On: 01/31/2022 05:34   DG Chest Port 1 View  Result Date: 02/08/2022 CLINICAL DATA:  Shortness of breath EXAM: PORTABLE CHEST 1 VIEW COMPARISON:  Chest x-ray dated January 30, 2022 FINDINGS: Unchanged cardiomegaly. Increased mid and lower lung predominant heterogeneous opacities. No definite pleural effusion. No evidence of pneumothorax. IMPRESSION: Increased heterogeneous opacities of the right lung, concerning for worsening infection. Electronically Signed   By: Yetta Glassman M.D.   On: 02/13/2022 11:12     Medications:    amiodarone 30 mg/hr (02/01/22 1000)   ceFEPime (MAXIPIME) IV     famotidine (PEPCID) IV 100 mL/hr at 02/01/22 1000   feeding  supplement (VITAL HIGH PROTEIN) 50 mL/hr at 02/01/22 1000   fentaNYL infusion INTRAVENOUS 100 mcg/hr (02/01/22 1000)   heparin 2,000 Units/hr (02/01/22 1000)   linezolid (ZYVOX) IV 600 mg (02/01/22 1036)   metronidazole 100 mL/hr at 02/01/22 0900   phenylephrine (NEO-SYNEPHRINE) Adult infusion 55 mcg/min (02/01/22 1000)   propofol (DIPRIVAN) infusion 25 mcg/kg/min (02/01/22 1000)    atorvastatin  40 mg Per Tube Daily   Chlorhexidine Gluconate Cloth  6 each Topical Q0600   feeding supplement (PROSource TF)  45 mL Per Tube  TID   free water  30 mL Per Tube Q4H   insulin aspart  0-6 Units Subcutaneous Q4H   lidocaine  1 patch Transdermal Q24H   mouth rinse  15 mL Mouth Rinse Q2H   acetaminophen, guaiFENesin-dextromethorphan, morphine injection, mouth rinse  Assessment/ Plan:  41 y.o. male with   chronic systolic and diastolic CHF, hypertension, anemia, polysubstance abuse, chronic kidney disease admitted on 01/31/2022 for Acute respiratory failure ([name]) [J96.00] Acute respiratory failure with hypoxia ([name]) [J96.01] Atrial fibrillation with RVR ([place]) [I48.91] Community acquired pneumonia of right lung, unspecified part of lung [J18.9] Acute on chronic congestive heart failure, unspecified heart failure type ([name]) [I50.9] Acute respiratory failure with hypoxemia ([name]) [J96.01]  Acute kidney injury on chronic kidney disease stage IIIb. Baseline creatinine 2.03/GFR 42 on November 04, 2021. Renal ultrasound without obstruction.  No IV contrast exposure. Furosemide infusion did not improve urine output.  In fact serum creatinine has worsened over the last 24 hours.  AKI likely secondary to cardiorenal syndrome. Chest x-ray today shows stable cardiomegaly. Electrolytes and volume status are acceptable.  No acute indication for dialysis today but if renal parameters continue to worsen, we will need renal replacement therapy soon.  We will discontinue furosemide infusion.  Acute respiratory  failure Currently ventilator assisted.  FiO2 40% Getting IV cefepime, linezolid, metronidazole for community-acquired pneumonia  Acute exacerbation of chronic systolic and diastolic CHF 2D echo September 05, 2021-severe global reduction in LV function, severe LVH, EF 20 to 25%, global hypokinesis, grade 3 diastolic dysfunction     LOS: [place] 6/17/202310:56 AM  [place], [place]  Note: This note was prepared with Dragon dictation. Any transcription errors are unintentional

## 2022-02-01 NOTE — Progress Notes (Signed)
NAME:  Gerald [name], MRN:  [id number], DOB:  1981-07-05, LOS: 2 ADMISSION DATE:  02/06/2022 CONSULTATION DATE:  02/01/2022    History of Present Illness:  41 y.o. male with history of chronic systolic and diastolic CHF last EF measured was 20 to 25% in January 2023 with history of hypertension, chronic kidney disease stage III baseline creatinine around 2,  anemia polysubstance abuse admits to taking cocaine presents to the ER because of worsening shortness of breath for the last 3 days with no associated chest pain has some productive cough.   Patient states he has not taken his medicines for last 3 days.  Admitted to [facility] with SOB    ED Course: In the ER patient was hypoxic initially requiring BiPAP but after placing on BiPAP patient had vomiting and had to be taken off the BiPAP was placed on high flow oxygen.  Patient chest x-ray shows congestion was placed on IV Lasix and also empirically on antibiotics.   BNP 790 high sensitive troponin was 553 and 4 and 72 was started on IV heparin.  Patient admitted for acute hypoxic respiratory failure likely from CHF.  Since patient was in A-fib with RVR initially was started on Cardizem infusion.  Transition to amiodarone.  PCCM consulted to assess worsening SOB and increased WOB today 6/15     Pertinent  Medical History   Active Ambulatory Problems    Diagnosis Date Noted   Community acquired pneumonia of right lung 12/21/2015   Acute respiratory distress 04/01/2019   Acute on chronic combined systolic and diastolic CHF (congestive heart failure) ([name])    Atrial flutter ([name])    Essential hypertension    PAF (paroxysmal atrial fibrillation) ([place]) 07/05/2020   Elevated troponin 07/05/2020   Microcytic anemia 07/05/2020   Hypokalemia 07/05/2020   Hypertensive emergency 12/21/2020   Stage 3a chronic kidney disease (CKD) ([facility]) - baseline SCr 1.8-2.0 12/21/2020   Cocaine abuse ([place]) 01/20/2021   Obesity, Class III, BMI 40-49.9 (morbid  obesity) ([place]) 01/20/2021   Acute on chronic congestive heart failure ([place]) 03/30/2021   Restless leg 07/08/2021   Severe uncontrolled hypertension 09/05/2021   Atrial fibrillation with rapid ventricular response ([place]) 11/02/2021   Resolved Ambulatory Problems    Diagnosis Date Noted   NSTEMI (non-ST elevated myocardial infarction) ([place]) 12/21/2015   Persistent atrial fibrillation ([name])    Acute systolic congestive heart failure ([name])    Acute on chronic systolic CHF (congestive heart failure) ([place]) 07/05/2020   Hypertensive urgency 07/05/2020   Hypertensive crisis 03/50/0938   Acute diastolic CHF (congestive heart failure) ([place]) 03/30/2021   Hemoptysis 07/07/2021   Past Medical History:  Diagnosis Date   Atrial flutter, paroxysmal ([name])    CHF (congestive heart failure) ([place]) 03/30/2021   Chronic combined systolic and diastolic CHF (congestive heart failure) ([place])    Chronic kidney disease, stage III (moderate) ([place])    Dysrhythmia    Hypertension    Polysubstance abuse ([place])      Significant Hospital Events: Including procedures, antibiotic start and stop dates in addition to other pertinent events   6/14 admitted for acute sCHF 6/15 PCCM consulted for impending resp failure 02/25/23 patient coded for 1 round with the intubation tried from respiratory arrest 06/17 patient is anuric dialysis catheter is and nephrology is involved he might need CRRT or HD     Micro Data:  COVID NEG  Antimicrobials:   Antibiotics Given (last 72 hours)     Date/Time Action Medication Dose  Rate   01/26/2022 0147 New Bag/Given   cefTRIAXone (ROCEPHIN) 2 g in sodium chloride 0.9 % 100 mL IVPB 2 g 200 mL/hr   02/13/2022 0359 New Bag/Given   azithromycin (ZITHROMAX) 500 mg in sodium chloride 0.9 % 250 mL IVPB 500 mg 250 mL/hr   02/13/2022 0747 New Bag/Given   doxycycline (VIBRAMYCIN) 100 mg in sodium chloride 0.9 % 250 mL IVPB 100 mg 125 mL/hr   02/04/2022 1808 New Bag/Given   doxycycline  (VIBRAMYCIN) 100 mg in sodium chloride 0.9 % 250 mL IVPB 100 mg 125 mL/hr   01/31/22 0003 New Bag/Given   cefTRIAXone (ROCEPHIN) 2 g in sodium chloride 0.9 % 100 mL IVPB 2 g 200 mL/hr   01/31/22 1002 New Bag/Given   ceFEPIme (MAXIPIME) 2 g in sodium chloride 0.9 % 100 mL IVPB 2 g 200 mL/hr   01/31/22 1003 New Bag/Given   metroNIDAZOLE (FLAGYL) IVPB 500 mg 500 mg 100 mL/hr   01/31/22 1133 New Bag/Given   linezolid (ZYVOX) IVPB 600 mg 600 mg 300 mL/hr   01/31/22 2009 New Bag/Given   metroNIDAZOLE (FLAGYL) IVPB 500 mg 500 mg 100 mL/hr   01/31/22 2214 New Bag/Given   ceFEPIme (MAXIPIME) 2 g in sodium chloride 0.9 % 100 mL IVPB 2 g 200 mL/hr   01/31/22 2256 New Bag/Given   linezolid (ZYVOX) IVPB 600 mg 600 mg 300 mL/hr   02/01/22 0856 New Bag/Given   metroNIDAZOLE (FLAGYL) IVPB 500 mg 500 mg 100 mL/hr            Interim History / Subjective:  Intubated sedated high risk of decompensation anasarca     Objective   Blood pressure 93/77, pulse 74, temperature 99.9 F (37.7 C), resp. rate 20, height 6' (1.829 m), weight 132.6 kg, SpO2 94 %.    Vent Mode: PRVC FiO2 (%):  [40 %-50 %] 40 % Set Rate:  [20 bmp] 20 bmp Vt Set:  [550 ZO-109604 mL] 540981 mL PEEP:  [8 cmH20-15 cmH20] 8 cmH20 Plateau Pressure:  [25 cmH20-26 cmH20] 25 cmH20   Intake/Output Summary (Last 24 hours) at 02/01/2022 0942 Last data filed at 02/01/2022 0900 Gross per 24 hour  Intake 4799.64 ml  Output 300 ml  Net 4499.64 ml    Filed Weights   01/25/2022 0016 01/29/2022 0300 02/01/22 0337  Weight: (!) 140 kg 131.5 kg 132.6 kg      REVIEW OF SYSTEMS Unable to obtain due to patient condition    PHYSICAL EXAMINATION:  GENERAL: Intubated sedated EYES: Pupils equal, round, reactive to light.  No scleral icterus.  MOUTH: Moist mucosal membrane.  NECK: Supple.  PULMONARY: +rhonchi, +wheezing CARDIOVASCULAR: S1 and S2.  No murmurs  GASTROINTESTINAL: Soft, nontender, -distended. Positive bowel sounds.   MUSCULOSKELETAL: No swelling, clubbing, or edema.  NEUROLOGIC: Intubated sedated SKIN:intact,warm,dry     Labs/imaging that I havepersonally reviewed  (right click and "Reselect all SmartList Selections" daily)        ASSESSMENT AND PLAN SYNOPSIS  41 yo morbidly obese AAM with acute sCHF exacerbation with acute hypoxic resp failure with pulm edema and renal failure   Severe ACUTE Hypoxic and Hypercapnic Respiratory Failure On the ventilator Vent Mode: PRVC FiO2 (%):  [40 %-50 %] 40 % Set Rate:  [20 bmp] 20 bmp Vt Set:  [550 XB-147829 mL] 550550 mL PEEP:  [8 cmH20-15 cmH20] 8 cmH20 Plateau Pressure:  [25 cmH20-26 cmH20] 25 cmH20   CARDIAC FAILURE-acute systolic dysfunction with AFIB   - 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 -follow up cardiac enzymes as indicated Follow up cardiology recs Continue AMIO AND HEP infusion   CARDIAC ICU monitoring   ACUTE KIDNEY INJURY/Renal Failure -continue Foley Catheter-assess need -Avoid nephrotoxic agents -Follow urine output, BMP -Ensure adequate renal perfusion, optimize oxygenation -Renal dose medications -Patient failed high-dose Lasix drip he is to go for dialysis with urgency if needed nephrology is following nonresponsive AKI to diuretics will need CRRT for removal of fluid   Intake/Output Summary (Last 24 hours) at 02/01/2022 0942 Last data filed at 02/01/2022 0900 Gross per 24 hour  Intake 4799.64 ml  Output 300 ml  Net 4499.64 ml     ENDO - ICU hypoglycemic\Hyperglycemia protocol -check FSBS per protocol   GI GI PROPHYLAXIS as indicated  NUTRITIONAL STATUS DIET-->NPO Constipation protocol as indicated   ELECTROLYTES -follow labs as needed -replace as needed -pharmacy consultation and following   ACUTE ANEMIA- TRANSFUSE AS NEEDED CONSIDER TRANSFUSION  IF HGB<7      Best practice (right click and "Reselect all SmartList Selections" daily)  Diet: Trickle feed DVT prophylaxis:  Systemic AC Mobility:  bed rest  Code Status:  FULL Disposition:ICU  Labs   CBC: Recent Labs  Lab 01/26/2022 0023 02/11/2022 0638 01/31/22 0505 01/31/22 0802 02/01/22 0321  WBC 7.9 9.7 14.1* 12.7* 11.8*  NEUTROABS 5.1 7.6  --  10.3* 8.8*  HGB 12.0* 11.3* 11.9* 10.6* 10.3*  HCT 40.5 37.2* 39.9 35.3* 35.1*  MCV 68.9* 67.1* 68.7* 68.7* 69.8*  PLT 233 229 213 218 195     Basic Metabolic Panel: Recent Labs  Lab 01/16/2022 0023 01/28/2022 0638 01/31/22 0505 01/31/22 0802 02/01/22 0321  NA 137 139 137 136 136  K 3.4* 3.6 3.5 4.1 4.4  CL 107 110 106 105 104  CO2 21* 20* 21* 22 20*  GLUCOSE 113* 132* 133* 131* 97  BUN 39* 35* 31* 33* 45*  CREATININE 2.43* 2.22* 2.44* 3.13* 4.77*  CALCIUM 9.3 8.8* 8.8* 9.0 8.6*  MG 2.3 2.1 2.1  --  2.2    GFR: Estimated Creatinine Clearance: 28.7 mL/min (A) (by C-G formula based on SCr of 4.77 mg/dL (H)). Recent Labs  Lab 02/13/2022 0117 02/09/2022 0153 02/11/2022 0537 01/28/2022 0638 01/31/22 0505 01/31/22 0802 02/01/22 0321  PROCALCITON  --   --  0.19  --   --   --   --   WBC  --   --   --  9.7 14.1* 12.7* 11.8*  LATICACIDVEN 1.2 1.0  --   --   --   --   --      Liver Function Tests: Recent Labs  Lab 01/20/2022 0023 02/02/2022 0638 01/31/22 0802 02/01/22 0321  AST 21 21 21  256*  ALT 34 32 26 270*  ALKPHOS 84 80 64 63  BILITOT 1.5* 1.5* 0.8 1.2  PROT 8.0 7.6 6.8 6.4*  ALBUMIN 3.7 3.4* 3.0* 2.6*    No results for input(s): "LIPASE", "AMYLASE" in the last 168 hours. Recent Labs  Lab 01/31/22 0505  AMMONIA 23     ABG    Component Value Date/Time   PHART 7.29 (L) 01/31/2022 2029   PCO2ART 43 01/31/2022 2029   PO2ART 100 01/31/2022 2029   HCO3 20.7 01/31/2022 2029   ACIDBASEDEF 5.7 (H) 01/31/2022 2029   O2SAT 98.9 01/31/2022 2029     Coagulation Profile: Recent Labs  Lab 01/23/2022 0028  INR 1.5*     Cardiac Enzymes: No results for input(s): "CKTOTAL", "CKMB", "CKMBINDEX", "TROPONINI" in the last 168  hours.  HbA1C: Hgb A1c MFr Bld  Date/Time Value Ref Range Status  09/05/2021 05:00 PM 6.1 (H) 4.8 - 5.6 % Final    Comment:    (NOTE)         Prediabetes: 5.7 - 6.4         Diabetes: >6.4         Glycemic control for adults with diabetes: <7.0   03/30/2021 08:25 PM 6.2 (H) 4.8 - 5.6 % Final    Comment:    (NOTE) Pre diabetes:          5.7%-6.4%  Diabetes:              >6.4%  Glycemic control for   <7.0% adults with diabetes     CBG: Recent Labs  Lab 01/31/22 2041 01/31/22 2333 02/01/22 0335 02/01/22 0351 02/01/22 0742  GLUCAP 101* 121* 94 88 76      Past Medical History:  He,  has a past medical history of Acute on chronic combined systolic and diastolic CHF (congestive heart failure) ([name]), Acute on chronic systolic CHF (congestive heart failure) ([place]) (98/92/1194), Acute systolic congestive heart failure ([place]), Atrial flutter, paroxysmal ([place]), CHF (congestive heart failure) ([place]) (03/30/2021), Chronic combined systolic and diastolic CHF (congestive heart failure) ([place]), Chronic kidney disease, stage III (moderate) ([place]), Dysrhythmia, Hypertension, Hypertensive crisis (01/20/2021), Hypertensive emergency (12/21/2020), NSTEMI (non-ST elevated myocardial infarction) ([place]) (12/21/2015), and Polysubstance abuse ([place]).   Surgical History:   Past Surgical History:  Procedure Laterality Date   KNEE SURGERY Right    LEFT HEART CATH AND CORONARY ANGIOGRAPHY N/A 12/21/2020   Procedure: LEFT HEART CATH AND CORONARY ANGIOGRAPHY;  Surgeon: Martinique, Peter M, MD;  Location: [facility];  Service: Cardiovascular;  Laterality: N/A;   TEE WITHOUT CARDIOVERSION N/A 04/04/2019   Procedure: TRANSESOPHAGEAL ECHOCARDIOGRAM (TEE);  Surgeon: [name], MD;  Location: [facility];  Service: Cardiovascular;  Laterality: N/A;     Social History:   reports that he has been smoking cigarettes. He has a 21.00 pack-year smoking history. He has never used smokeless tobacco. He reports current  alcohol use of about 6.0 standard drinks of alcohol per week. He reports current drug use. Drugs: Cocaine and Marijuana.   Family History:  His family history includes Heart disease in his paternal grandfather; Hypertension in his father.   Allergies No Known Allergies   Home Medications  Prior to Admission medications   Medication Sig Start Date End Date Taking? Authorizing Provider  amLODipine (NORVASC) 5 MG tablet Take 2 tablets (10 mg total) by mouth once daily. 11/05/21 02/03/22 Yes Enzo Bi, MD  atorvastatin (LIPITOR) 40 MG tablet Take 1 tablet (40 mg total) by mouth once daily. 11/05/21 02/03/22 Yes Enzo Bi, MD  carvedilol (COREG) 12.5 MG tablet Take 1 tablet (12.5 mg total) by mouth 2 (two) times daily with a meal. 11/05/21 02/03/22 Yes Enzo Bi, MD  cyclobenzaprine (FLEXERIL) 5 MG tablet Take 5 mg by mouth 3 (three) times daily as needed. 01/03/22  Yes [provider]  dapagliflozin propanediol (FARXIGA) 10 MG TABS tablet Take 1 tablet (10 mg total) by mouth once daily before breakfast. 11/05/21  Yes Enzo Bi, MD  FEROSUL 325 (65 Fe) MG tablet Take 325 mg by mouth daily. 01/03/22  Yes [provider]  hydrALAZINE (APRESOLINE) 100 MG tablet Take 1 tablet (100 mg total) by mouth 3 (three) times daily. Patient taking differently: Take 50 mg by mouth 3 (three) times daily. 11/05/21 02/03/22 Yes Enzo Bi, MD  lidocaine (LIDODERM) 5 % Place 1 patch onto the skin daily. Place 1 patch on the skin daily. Apply to affected area for 12 hours only each day (then remove patch) 01/03/22 02/02/22 Yes [provider]  losartan (COZAAR) 25 MG tablet Take 1 tablet (25 mg total) by mouth once daily. 11/05/21 02/03/22 Yes Enzo Bi, MD  nicotine (NICODERM CQ - DOSED IN MG/24 HOURS) 21 mg/24hr patch Place 1 patch onto the skin daily. 01/03/22  Yes [provider]  nicotine polacrilex (NICORETTE) 4 MG gum Place 1 each inside cheek every hour as needed. Chew 1 piece (4 mg total) and  park in cheek every hour as needed for smoking cessation. 01/03/2022 02/02/2022 [place] 01/03/2022 01/03/22 02/02/22 Yes [provider]  spironolactone (ALDACTONE) 50 MG tablet Take 50 mg by mouth daily. 01/03/22  Yes [provider]  torsemide (DEMADEX) 20 MG tablet Take 1 tablet (20 mg total) by mouth once daily. 11/05/21 02/03/22 Yes Enzo Bi, MD  isosorbide mononitrate (IMDUR) 60 MG 24 hr tablet Take 2 tablets (120 mg total) by mouth once daily. 11/05/21 12/05/21  Enzo Bi, MD  potassium chloride (KLOR-CON) 10 MEQ tablet Take 1 tablet (10 mEq total) by mouth once daily. Patient not taking: Reported on 02/11/2022 11/05/21 02/03/22  Enzo Bi, MD  rivaroxaban (XARELTO) 20 MG TABS tablet Take 1 tablet (20 mg total) by mouth once daily with supper. 11/05/21 02/03/22  Enzo Bi, MD  potassium chloride (KLOR-CON M) 10 MEQ tablet Take 1 tablet (10 mEq total) by mouth daily. 09/09/21 11/05/21  Dorethea Clan, DO       DVT/GI PRX  assessed I Assessed the need for Labs I Assessed the need for Foley I Assessed the need for Central Venous Line Family Discussion when available I Assessed the need for Mobilization I made an Assessment of medications to be adjusted accordingly Safety Risk assessment completed

## 2022-02-01 NOTE — Consult Note (Signed)
NAME:  Gerald [name], MRN:  [id number], DOB:  05/10/1981, LOS: 2 ADMISSION DATE:  02/09/2022 CONSULTATION DATE:  02/01/2022    History of Present Illness:  41 y.o. male with history of chronic systolic and diastolic CHF last EF measured was 20 to 25% in January 2023 with history of hypertension, chronic kidney disease stage III baseline creatinine around 2,  anemia polysubstance abuse admits to taking cocaine presents to the ER because of worsening shortness of breath for the last 3 days with no associated chest pain has some productive cough.   Patient states he has not taken his medicines for last 3 days.  Admitted to [facility] with SOB    ED Course: In the ER patient was hypoxic initially requiring BiPAP but after placing on BiPAP patient had vomiting and had to be taken off the BiPAP was placed on high flow oxygen.  Patient chest x-ray shows congestion was placed on IV Lasix and also empirically on antibiotics.   BNP 790 high sensitive troponin was 553 and 4 and 72 was started on IV heparin.  Patient admitted for acute hypoxic respiratory failure likely from CHF.  Since patient was in A-fib with RVR initially was started on Cardizem infusion.  Transition to amiodarone.  PCCM consulted to assess worsening SOB and increased WOB today 6/15     Pertinent  Medical History   Active Ambulatory Problems    Diagnosis Date Noted   Community acquired pneumonia of right lung 12/21/2015   Acute respiratory distress 04/01/2019   Acute on chronic combined systolic and diastolic CHF (congestive heart failure) ([name])    Atrial flutter ([name])    Essential hypertension    PAF (paroxysmal atrial fibrillation) ([place]) 07/05/2020   Elevated troponin 07/05/2020   Microcytic anemia 07/05/2020   Hypokalemia 07/05/2020   Hypertensive emergency 12/21/2020   Stage 3a chronic kidney disease (CKD) ([place]) - baseline SCr 1.8-2.0 12/21/2020   Cocaine abuse ([place]) 01/20/2021   Obesity, Class III, BMI 40-49.9 (morbid  obesity) ([place]) 01/20/2021   Acute on chronic congestive heart failure ([place]) 03/30/2021   Restless leg 07/08/2021   Severe uncontrolled hypertension 09/05/2021   Atrial fibrillation with rapid ventricular response ([place]) 11/02/2021   Resolved Ambulatory Problems    Diagnosis Date Noted   NSTEMI (non-ST elevated myocardial infarction) ([place]) 12/21/2015   Persistent atrial fibrillation ([name])    Acute systolic congestive heart failure ([name])    Acute on chronic systolic CHF (congestive heart failure) ([place]) 07/05/2020   Hypertensive urgency 07/05/2020   Hypertensive crisis 58/83/2549   Acute diastolic CHF (congestive heart failure) ([place]) 03/30/2021   Hemoptysis 07/07/2021   Past Medical History:  Diagnosis Date   Atrial flutter, paroxysmal ([name])    CHF (congestive heart failure) ([place]) 03/30/2021   Chronic combined systolic and diastolic CHF (congestive heart failure) ([place])    Chronic kidney disease, stage III (moderate) ([place])    Dysrhythmia    Hypertension    Polysubstance abuse ([place])      Significant Hospital Events: Including procedures, antibiotic start and stop dates in addition to other pertinent events   6/14 admitted for acute sCHF 6/15 PCCM consulted for impending resp failure 02-14-2023 patient coded for 1 round with the intubation tried from respiratory arrest 06/17 patient is anuric dialysis catheter is and nephrology is involved he might need CRRT or HD     Micro Data:  COVID NEG  Antimicrobials:   Antibiotics Given (last 72 hours)     Date/Time Action Medication Dose  Rate   01/21/2022 0147 New Bag/Given   cefTRIAXone (ROCEPHIN) 2 g in sodium chloride 0.9 % 100 mL IVPB 2 g 200 mL/hr   02/08/2022 0359 New Bag/Given   azithromycin (ZITHROMAX) 500 mg in sodium chloride 0.9 % 250 mL IVPB 500 mg 250 mL/hr   01/27/2022 0747 New Bag/Given   doxycycline (VIBRAMYCIN) 100 mg in sodium chloride 0.9 % 250 mL IVPB 100 mg 125 mL/hr   01/24/2022 1808 New Bag/Given   doxycycline  (VIBRAMYCIN) 100 mg in sodium chloride 0.9 % 250 mL IVPB 100 mg 125 mL/hr   01/31/22 0003 New Bag/Given   cefTRIAXone (ROCEPHIN) 2 g in sodium chloride 0.9 % 100 mL IVPB 2 g 200 mL/hr   01/31/22 1002 New Bag/Given   ceFEPIme (MAXIPIME) 2 g in sodium chloride 0.9 % 100 mL IVPB 2 g 200 mL/hr   01/31/22 1003 New Bag/Given   metroNIDAZOLE (FLAGYL) IVPB 500 mg 500 mg 100 mL/hr   01/31/22 1133 New Bag/Given   linezolid (ZYVOX) IVPB 600 mg 600 mg 300 mL/hr   01/31/22 2009 New Bag/Given   metroNIDAZOLE (FLAGYL) IVPB 500 mg 500 mg 100 mL/hr   01/31/22 2214 New Bag/Given   ceFEPIme (MAXIPIME) 2 g in sodium chloride 0.9 % 100 mL IVPB 2 g 200 mL/hr   01/31/22 2256 New Bag/Given   linezolid (ZYVOX) IVPB 600 mg 600 mg 300 mL/hr   02/01/22 0856 New Bag/Given   metroNIDAZOLE (FLAGYL) IVPB 500 mg 500 mg 100 mL/hr            Interim History / Subjective:  Intubated sedated high risk of decompensation anasarca     Objective   Blood pressure 93/77, pulse 68, temperature 99.7 F (37.6 C), resp. rate 20, height 6' (1.829 m), weight 132.6 kg, SpO2 95 %.    Vent Mode: PRVC FiO2 (%):  [40 %-50 %] 40 % Set Rate:  [20 bmp] 20 bmp Vt Set:  [550 WS-568127 mL] 517001 mL PEEP:  [8 cmH20-15 cmH20] 8 cmH20 Plateau Pressure:  [25 cmH20-26 cmH20] 25 cmH20   Intake/Output Summary (Last 24 hours) at 02/01/2022 0913 Last data filed at 02/01/2022 0900 Gross per 24 hour  Intake 4799.64 ml  Output 300 ml  Net 4499.64 ml    Filed Weights   01/16/2022 0016 01/21/2022 0300 02/01/22 0337  Weight: (!) 140 kg 131.5 kg 132.6 kg      REVIEW OF SYSTEMS Unable to obtain due to patient condition    PHYSICAL EXAMINATION:  GENERAL: Intubated sedated EYES: Pupils equal, round, reactive to light.  No scleral icterus.  MOUTH: Moist mucosal membrane.  NECK: Supple.  PULMONARY: +rhonchi, +wheezing CARDIOVASCULAR: S1 and S2.  No murmurs  GASTROINTESTINAL: Soft, nontender, -distended. Positive bowel sounds.   MUSCULOSKELETAL: No swelling, clubbing, or edema.  NEUROLOGIC: Intubated sedated SKIN:intact,warm,dry     Labs/imaging that I havepersonally reviewed  (right click and "Reselect all SmartList Selections" daily)        ASSESSMENT AND PLAN SYNOPSIS  41 yo morbidly obese AAM with acute sCHF exacerbation with acute hypoxic resp failure with pulm edema and renal failure   Severe ACUTE Hypoxic and Hypercapnic Respiratory Failure On the ventilator Vent Mode: PRVC FiO2 (%):  [40 %-50 %] 40 % Set Rate:  [20 bmp] 20 bmp Vt Set:  [550 VC-944967 mL] 550550 mL PEEP:  [8 cmH20-15 cmH20] 8 cmH20 Plateau Pressure:  [25 cmH20-26 cmH20] 25 cmH20   CARDIAC FAILURE-acute systolic dysfunction with AFIB   - 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 -follow up cardiac enzymes as indicated Follow up cardiology recs Continue AMIO AND HEP infusion   CARDIAC ICU monitoring   ACUTE KIDNEY INJURY/Renal Failure -continue Foley Catheter-assess need -Avoid nephrotoxic agents -Follow urine output, BMP -Ensure adequate renal perfusion, optimize oxygenation -Renal dose medications -Patient failed high-dose Lasix drip he is to go for dialysis with urgency if needed nephrology is following nonresponsive AKI to diuretics will need CRRT for removal of fluid   Intake/Output Summary (Last 24 hours) at 02/01/2022 0913 Last data filed at 02/01/2022 0900 Gross per 24 hour  Intake 4799.64 ml  Output 300 ml  Net 4499.64 ml     ENDO - ICU hypoglycemic\Hyperglycemia protocol -check FSBS per protocol   GI GI PROPHYLAXIS as indicated  NUTRITIONAL STATUS DIET-->NPO Constipation protocol as indicated   ELECTROLYTES -follow labs as needed -replace as needed -pharmacy consultation and following   ACUTE ANEMIA- TRANSFUSE AS NEEDED CONSIDER TRANSFUSION  IF HGB<7      Best practice (right click and "Reselect all SmartList Selections" daily)  Diet: Trickle feed DVT prophylaxis:  Systemic AC Mobility:  bed rest  Code Status:  FULL Disposition:ICU  Labs   CBC: Recent Labs  Lab 01/29/2022 0023 01/27/2022 0638 01/31/22 0505 01/31/22 0802 02/01/22 0321  WBC 7.9 9.7 14.1* 12.7* 11.8*  NEUTROABS 5.1 7.6  --  10.3* 8.8*  HGB 12.0* 11.3* 11.9* 10.6* 10.3*  HCT 40.5 37.2* 39.9 35.3* 35.1*  MCV 68.9* 67.1* 68.7* 68.7* 69.8*  PLT 233 229 213 218 195     Basic Metabolic Panel: Recent Labs  Lab 01/26/2022 0023 01/19/2022 0638 01/31/22 0505 01/31/22 0802 02/01/22 0321  NA 137 139 137 136 136  K 3.4* 3.6 3.5 4.1 4.4  CL 107 110 106 105 104  CO2 21* 20* 21* 22 20*  GLUCOSE 113* 132* 133* 131* 97  BUN 39* 35* 31* 33* 45*  CREATININE 2.43* 2.22* 2.44* 3.13* 4.77*  CALCIUM 9.3 8.8* 8.8* 9.0 8.6*  MG 2.3 2.1 2.1  --  2.2    GFR: Estimated Creatinine Clearance: 28.7 mL/min (A) (by C-G formula based on SCr of 4.77 mg/dL (H)). Recent Labs  Lab 01/20/2022 0117 01/23/2022 0153 01/31/2022 0537 01/22/2022 0638 01/31/22 0505 01/31/22 0802 02/01/22 0321  PROCALCITON  --   --  0.19  --   --   --   --   WBC  --   --   --  9.7 14.1* 12.7* 11.8*  LATICACIDVEN 1.2 1.0  --   --   --   --   --      Liver Function Tests: Recent Labs  Lab 01/22/2022 0023 01/20/2022 0638 01/31/22 0802 02/01/22 0321  AST 21 21 21  256*  ALT 34 32 26 270*  ALKPHOS 84 80 64 63  BILITOT 1.5* 1.5* 0.8 1.2  PROT 8.0 7.6 6.8 6.4*  ALBUMIN 3.7 3.4* 3.0* 2.6*    No results for input(s): "LIPASE", "AMYLASE" in the last 168 hours. Recent Labs  Lab 01/31/22 0505  AMMONIA 23    ABG    Component Value Date/Time   PHART 7.29 (L) 01/31/2022 2029   PCO2ART 43 01/31/2022 2029   PO2ART 100 01/31/2022 2029   HCO3 20.7 01/31/2022 2029   ACIDBASEDEF 5.7 (H) 01/31/2022 2029   O2SAT 98.9 01/31/2022 2029     Coagulation Profile: Recent Labs  Lab 02/12/2022 0028  INR 1.5*     Cardiac Enzymes: No results for input(s): "CKTOTAL", "CKMB", "CKMBINDEX", "TROPONINI" in the last 168  hours.  HbA1C: Hgb A1c MFr Bld  Date/Time Value Ref Range Status  09/05/2021 05:00 PM 6.1 (H) 4.8 - 5.6 % Final    Comment:    (NOTE)         Prediabetes: 5.7 - 6.4         Diabetes: >6.4         Glycemic control for adults with diabetes: <7.0   03/30/2021 08:25 PM 6.2 (H) 4.8 - 5.6 % Final    Comment:    (NOTE) Pre diabetes:          5.7%-6.4%  Diabetes:              >6.4%  Glycemic control for   <7.0% adults with diabetes     CBG: Recent Labs  Lab 01/31/22 2041 01/31/22 2333 02/01/22 0335 02/01/22 0351 02/01/22 0742  GLUCAP 101* 121* 94 88 76      Past Medical History:  He,  has a past medical history of Acute on chronic combined systolic and diastolic CHF (congestive heart failure) ([name]), Acute on chronic systolic CHF (congestive heart failure) ([place]) (61/60/7371), Acute systolic congestive heart failure ([place]), Atrial flutter, paroxysmal ([place]), CHF (congestive heart failure) ([place]) (03/30/2021), Chronic combined systolic and diastolic CHF (congestive heart failure) ([place]), Chronic kidney disease, stage III (moderate) ([place]), Dysrhythmia, Hypertension, Hypertensive crisis (01/20/2021), Hypertensive emergency (12/21/2020), NSTEMI (non-ST elevated myocardial infarction) ([place]) (12/21/2015), and Polysubstance abuse ([place]).   Surgical History:   Past Surgical History:  Procedure Laterality Date   KNEE SURGERY Right    LEFT HEART CATH AND CORONARY ANGIOGRAPHY N/A 12/21/2020   Procedure: LEFT HEART CATH AND CORONARY ANGIOGRAPHY;  Surgeon: Martinique, Peter M, MD;  Location: [facility];  Service: Cardiovascular;  Laterality: N/A;   TEE WITHOUT CARDIOVERSION N/A 04/04/2019   Procedure: TRANSESOPHAGEAL ECHOCARDIOGRAM (TEE);  Surgeon: [name], MD;  Location: [facility];  Service: Cardiovascular;  Laterality: N/A;     Social History:   reports that he has been smoking cigarettes. He has a 21.00 pack-year smoking history. He has never used smokeless tobacco. He reports current  alcohol use of about 6.0 standard drinks of alcohol per week. He reports current drug use. Drugs: Cocaine and Marijuana.   Family History:  His family history includes Heart disease in his paternal grandfather; Hypertension in his father.   Allergies No Known Allergies   Home Medications  Prior to Admission medications   Medication Sig Start Date End Date Taking? Authorizing Provider  amLODipine (NORVASC) 5 MG tablet Take 2 tablets (10 mg total) by mouth once daily. 11/05/21 02/03/22 Yes Enzo Bi, MD  atorvastatin (LIPITOR) 40 MG tablet Take 1 tablet (40 mg total) by mouth once daily. 11/05/21 02/03/22 Yes Enzo Bi, MD  carvedilol (COREG) 12.5 MG tablet Take 1 tablet (12.5 mg total) by mouth 2 (two) times daily with a meal. 11/05/21 02/03/22 Yes Enzo Bi, MD  cyclobenzaprine (FLEXERIL) 5 MG tablet Take 5 mg by mouth 3 (three) times daily as needed. 01/03/22  Yes [provider]  dapagliflozin propanediol (FARXIGA) 10 MG TABS tablet Take 1 tablet (10 mg total) by mouth once daily before breakfast. 11/05/21  Yes Enzo Bi, MD  FEROSUL 325 (65 Fe) MG tablet Take 325 mg by mouth daily. 01/03/22  Yes [provider]  hydrALAZINE (APRESOLINE) 100 MG tablet Take 1 tablet (100 mg total) by mouth 3 (three) times daily. Patient taking differently: Take 50 mg by mouth 3 (three) times daily. 11/05/21 02/03/22 Yes Enzo Bi, MD  lidocaine (LIDODERM) 5 % Place 1 patch onto the skin daily. Place 1 patch on the skin daily. Apply to affected area for 12 hours only each day (then remove patch) 01/03/22 02/02/22 Yes [provider]  losartan (COZAAR) 25 MG tablet Take 1 tablet (25 mg total) by mouth once daily. 11/05/21 02/03/22 Yes Enzo Bi, MD  nicotine (NICODERM CQ - DOSED IN MG/24 HOURS) 21 mg/24hr patch Place 1 patch onto the skin daily. 01/03/22  Yes [provider]  nicotine polacrilex (NICORETTE) 4 MG gum Place 1 each inside cheek every hour as needed. Chew 1 piece (4 mg total) and  park in cheek every hour as needed for smoking cessation. 01/03/2022 02/02/2022 [place] 01/03/2022 01/03/22 02/02/22 Yes [provider]  spironolactone (ALDACTONE) 50 MG tablet Take 50 mg by mouth daily. 01/03/22  Yes [provider]  torsemide (DEMADEX) 20 MG tablet Take 1 tablet (20 mg total) by mouth once daily. 11/05/21 02/03/22 Yes Enzo Bi, MD  isosorbide mononitrate (IMDUR) 60 MG 24 hr tablet Take 2 tablets (120 mg total) by mouth once daily. 11/05/21 12/05/21  Enzo Bi, MD  potassium chloride (KLOR-CON) 10 MEQ tablet Take 1 tablet (10 mEq total) by mouth once daily. Patient not taking: Reported on 02/06/2022 11/05/21 02/03/22  Enzo Bi, MD  rivaroxaban (XARELTO) 20 MG TABS tablet Take 1 tablet (20 mg total) by mouth once daily with supper. 11/05/21 02/03/22  Enzo Bi, MD  potassium chloride (KLOR-CON M) 10 MEQ tablet Take 1 tablet (10 mEq total) by mouth daily. 09/09/21 11/05/21  Dorethea Clan, DO       DVT/GI PRX  assessed I Assessed the need for Labs I Assessed the need for Foley I Assessed the need for Central Venous Line Family Discussion when available I Assessed the need for Mobilization I made an Assessment of medications to be adjusted accordingly Safety Risk assessment completed

## 2022-02-01 NOTE — Progress Notes (Signed)
Progress Note  Patient Name: Gerald [name] Date of Encounter: 02/01/2022  [facility] Cardiologist: Ida Rogue, MD   Subjective   Intubated, sedated, renal function appears to be worsening, chest x-ray this a.m showing bilateral pleural effusions.  Minimal urine output despite being on Lasix drip.  Inpatient Medications    Scheduled Meds:  atorvastatin  40 mg Per Tube Daily   Chlorhexidine Gluconate Cloth  6 each Topical Q0600   feeding supplement (PROSource TF)  45 mL Per Tube TID   free water  30 mL Per Tube Q4H   insulin aspart  0-6 Units Subcutaneous Q4H   lidocaine  1 patch Transdermal Q24H   mouth rinse  15 mL Mouth Rinse Q2H   Continuous Infusions:  amiodarone 30 mg/hr (02/01/22 1100)   ceFEPime (MAXIPIME) IV     famotidine (PEPCID) IV Stopped (02/01/22 1002)   feeding supplement (VITAL HIGH PROTEIN) 50 mL/hr at 02/01/22 1100   fentaNYL infusion INTRAVENOUS 100 mcg/hr (02/01/22 1100)   heparin 2,000 Units/hr (02/01/22 1100)   linezolid (ZYVOX) IV 300 mL/hr at 02/01/22 1100   metronidazole 100 mL/hr at 02/01/22 0900   phenylephrine (NEO-SYNEPHRINE) Adult infusion 50 mcg/min (02/01/22 1100)   propofol (DIPRIVAN) infusion 25 mcg/kg/min (02/01/22 1100)   PRN Meds: acetaminophen, guaiFENesin-dextromethorphan, morphine injection, mouth rinse   Vital Signs    Vitals:   02/01/22 1100 02/01/22 1115 02/01/22 1118 02/01/22 1130  BP: 93/67     Pulse: 76   (!) 48  Resp: (!) 21 (!) 21 (!) 21 (!) 21  Temp: (!) 100.4 F (38 C) (!) 100.4 F (38 C) (!) 100.4 F (38 C) (!) 100.4 F (38 C)  TempSrc:      SpO2: 93%  93% 94%  Weight:      Height:        Intake/Output Summary (Last 24 hours) at 02/01/2022 1141 Last data filed at 02/01/2022 1100 Gross per 24 hour  Intake 5319.32 ml  Output 318 ml  Net 5001.32 ml      02/01/2022    3:37 AM 02/13/2022    3:00 AM 01/25/2022   12:16 AM  Last 3 Weights  Weight (lbs) 292 lb 5.3 oz 290 lb 308 lb 10.3 oz  Weight  (kg) 132.6 kg 131.543 kg 140 kg      Telemetry    Atrial fibrillation, heart rate 84- Personally Reviewed  ECG     - Personally Reviewed  Physical Exam   GEN: Intubated, sedated Neck: Unable to assess for JVD Cardiac: Irregular irregular Respiratory: Vented breath sounds GI: Soft, nontender, non-distended  MS: 1+ edema Neuro: Intubated, sedated Psych: Unable to assess  Labs    High Sensitivity Troponin:   Recent Labs  Lab 02/12/2022 0023 02/10/2022 0153 02/04/2022 0537 02/13/2022 0638  TROPONINIHS 553* 472* 500* 450*     Chemistry Recent Labs  Lab 02/08/2022 1610 01/31/22 0505 01/31/22 0802 02/01/22 0321  NA 139 137 136 136  K 3.6 3.5 4.1 4.4  CL 110 106 105 104  CO2 20* 21* 22 20*  GLUCOSE 132* 133* 131* 97  BUN 35* 31* 33* 45*  CREATININE 2.22* 2.44* 3.13* 4.77*  CALCIUM 8.8* 8.8* 9.0 8.6*  MG 2.1 2.1  --  2.2  PROT 7.6  --  6.8 6.4*  ALBUMIN 3.4*  --  3.0* 2.6*  AST 21  --  21 256*  ALT 32  --  26 270*  ALKPHOS 80  --  64 63  BILITOT 1.5*  --  0.8 1.2  GFRNONAA 37* 33* 25* 15*  ANIONGAP 9 10 9 12     Lipids  Recent Labs  Lab 02/01/22 0321  TRIG 68    Hematology Recent Labs  Lab 01/31/22 0505 01/31/22 0802 02/01/22 0321  WBC 14.1* 12.7* 11.8*  RBC 5.81 5.14 5.03  HGB 11.9* 10.6* 10.3*  HCT 39.9 35.3* 35.1*  MCV 68.7* 68.7* 69.8*  MCH 20.5* 20.6* 20.5*  MCHC 29.8* 30.0 29.3*  RDW 18.3* 18.2* 18.6*  PLT 213 218 195   Thyroid  Recent Labs  Lab 01/29/2022 0638  TSH 0.920    BNP Recent Labs  Lab 01/23/2022 0023  BNP 793.1*    DDimer  Recent Labs  Lab 02/11/2022 0638  DDIMER 0.40     Radiology    DG Chest Port 1 View  Result Date: 02/01/2022 CLINICAL DATA:  Endotracheal tube placement. EXAM: PORTABLE CHEST 1 VIEW COMPARISON:  02/01/2022 FINDINGS: Endotracheal tube has tip 6.6 cm above the carina. Enteric tube courses into the region of the stomach and off the film as tip is not visualized. Lungs are hypoinflated with persistent slight  elevation of the right hemidiaphragm. Mild patchy hazy density over the right lung unchanged which may be due to asymmetric edema or infection. Mild stable hazy density left retrocardiac region. Stable cardiomegaly. Remainder of the exam is unchanged. IMPRESSION: 1. Hypoinflation with stable patchy hazy density over the right lung and left retrocardiac region. Stable cardiomegaly. 2. Tubes and lines as described. Electronically Signed   By: Marin Olp M.D.   On: 02/01/2022 10:48   DG Chest Port 1 View  Result Date: 02/01/2022 CLINICAL DATA:  Cardiac arrest. EXAM: PORTABLE CHEST 1 VIEW COMPARISON:  Portable chest yesterday at 6:38 a.m. FINDINGS: ETT tip is 5.3 cm from the carina, NGT adequately intragastric. The heart silhouette severely enlarged. There is perihilar vascular congestion central vascular prominence and patchy consolidation throughout the right lung fields and left lower lung field consistent with pneumonia, edema or combination and overlying small to moderate-sized pleural effusions. There is moderate interval clearing in the periphery of the right lung compared with yesterday's exam with no other change in the lung opacities. There is no new or worsening abnormality. In all other respects no further changes. IMPRESSION: 1. Cardiomegaly and persistent central vascular prominence. 2. Patchy consolidation in the right lung fields, left lower lung field with improvement in aeration of the peripheral right lung fields and otherwise unchanged. 3. Bilateral pleural effusions. Electronically Signed   By: Telford Nab M.D.   On: 02/01/2022 07:21   US RENAL  Result Date: 01/31/2022 CLINICAL DATA:  Acute kidney injury. EXAM: RENAL / URINARY TRACT ULTRASOUND COMPLETE COMPARISON:  Renal ultrasound 04/06/2019 FINDINGS: Right Kidney: Renal measurements: 12.2 x 5.2 x 5.1 cm = volume: 167 mL. The renal parenchyma is hyperechoic to the index organ, the liver. No focal lesions are present. No stone or  obstruction. Left Kidney: Renal measurements: 11.1 x 4.9 x 5.4 cm = volume: 156 mL. The renal parenchyma is hyperechoic to the index organ, the liver. No focal lesions are present. No stone or obstruction. Bladder: Not visualized Other: None. IMPRESSION: The kidneys are hyperechoic bilaterally without focal lesion or obstruction. This is nonspecific, but can be seen in the setting of medical renal disease. Echogenicity has increased since the prior exam. Electronically Signed   By: San Morelle M.D.   On: 01/31/2022 19:48   DG Chest 1 View  Result Date: 01/31/2022 CLINICAL DATA:  41 year old male  status post endotracheal tube placement. EXAM: CHEST  1 VIEW COMPARISON:  Chest x-ray 01/21/2022. FINDINGS: Patient is now intubated, with the tip of the endotracheal tube 6.9 cm above the carina. A nasogastric tube is seen extending into the stomach, however, the tip of the nasogastric tube extends below the lower margin of the image. Transcutaneous defibrillator pad projecting over the mid thorax. Lung volumes remain low. Near complete opacification of the right hemithorax and extensive opacification in the left mid to lower lung, similar to the prior study, indicative of severe bilateral (right-greater-than-left) multilobar pneumonia. No pneumothorax. Moderate cardiomegaly. The patient is rotated to the right on today's exam, resulting in distortion of the mediastinal contours and reduced diagnostic sensitivity and specificity for mediastinal pathology. IMPRESSION: 1. Support apparatus, as above. 2. Severe multilobar bilateral pneumonia (right-greater-than-left) redemonstrated. Electronically Signed   By: Vinnie Langton M.D.   On: 01/31/2022 06:49   DG Chest 1 View  Result Date: 01/31/2022 CLINICAL DATA:  42 year old male with history of shortness of breath. EXAM: CHEST  1 VIEW COMPARISON:  Chest x-ray 01/16/2022. FINDINGS: Worsening airspace consolidation throughout the entire right lung. Areas of  interstitial prominence and patchy ill-defined opacities are also noted in the left lung, most confluent in the left lung base. No definite pleural effusions. No pneumothorax. Pulmonary vasculature is obscured. Heart size is mildly enlarged. IMPRESSION: 1. Worsening multilobar bilateral pneumonia (right-greater-than-left), as above. Electronically Signed   By: Vinnie Langton M.D.   On: 01/31/2022 05:34    Cardiac Studies   Echocardiogram 08/2021  EF 20 to 25%, severe concentric LVH,  Patient Profile     41 y.o. male with history of nonischemic cardiomyopathy, polysubstance use, obesity presenting with worsening shortness of breath and respiratory failure leading to PEA arrest s/p CPR and epi with ROSC.  being seen for CHF and A-fib RVR.  Current clinical picture indicates multiorgan failure with elevated LFTs, rising creatinine.  Assessment & Plan    Nonischemic cardiomyopathy EF 25% -Not a candidate for any advanced heart failure therapy such as LVAD or transplant -Continue supportive care with pressors -Not making urine, Lasix drip was stopped -Will likely need dialysis to help with volume control   2.  A-fib RVR -Heart rates better controlled on amiodarone -Continue heparin drip, continue -Holding metoprolol to improve blood pressure  3.  Multiorgan failure, elevated LFTs, worsening creatinine -Appreciate input from nephrology -Lasix drip discontinued, may need renal replacement therapy if kidney function worsens  4.  Respiratory failure -Intubated, on antibiotics as per ICU team   Critical care time was exclusive of separate billable procedures and treating other patients.  Critial care time was spent personally by me on the following activities: development of treatment plan,  evaluation of patients response to treatment, examining patient, reviewing treatments/ interventions, lab studies, radiographic studies,.    The patient is critically ill with multiple organ systems  failure and requires high complexity decision making for assessment and support..   Total CCT spent directly with the patient today is 35 minutes    Signed, Kate Sable, MD  02/01/2022, 11:41 AM

## 2022-02-01 NOTE — Progress Notes (Signed)
Amiodarone drip decreased to 30 mg/hour as HR dropping below 60's. Neosynephrine started during day shift. Now running at 90 mcg/min.

## 2022-02-01 NOTE — Progress Notes (Signed)
RRT advanced ETT to 28 at lips, CXR result showed ETT 5 1/2 above carina

## 2022-02-01 NOTE — Progress Notes (Signed)
ANTICOAGULATION CONSULT NOTE   Pharmacy Consult for Heparin Infusion Indication: ACS/STEMI  Patient Measurements: Height: 6' (182.9 cm) Weight: 131.5 kg (290 lb) IBW/kg (Calculated) : 77.6 Heparin Dosing Weight: 109.9 kg  Labs: Recent Labs    02/04/2022 0028 01/22/2022 0153 01/22/2022 0537 02/04/2022 3220 02/13/2022 1343 01/31/22 0505 01/31/22 0802 01/31/22 1221 01/31/22 1803 02/01/22 0126  HGB  --   --   --  11.3*  --  11.9* 10.6*  --   --   --   HCT  --   --   --  37.2*  --  39.9 35.3*  --   --   --   PLT  --   --   --  229  --  213 218  --   --   --   APTT 50*  --   --  70*   < > 59*  --  76* 64* 75*  LABPROT 17.8*  --   --   --   --   --   --   --   --   --   INR 1.5*  --   --   --   --   --   --   --   --   --   HEPARINUNFRC  --  >1.10*  --   --   --  1.07*  --   --   --   --   CREATININE  --   --   --  2.22*  --  2.44* 3.13*  --   --   --   TROPONINIHS  --  472* 500* 450*  --   --   --   --   --   --    < > = values in this interval not displayed.     Estimated Creatinine Clearance: 43.6 mL/min (A) (by C-G formula based on SCr of 3.13 mg/dL (H)).   Medical History: Past Medical History:  Diagnosis Date   Acute on chronic combined systolic and diastolic CHF (congestive heart failure) ([name])    Acute on chronic systolic CHF (congestive heart failure) ([place]) 25/42/7062   Acute systolic congestive heart failure ([name])    Atrial flutter, paroxysmal ([name])    CHF (congestive heart failure) ([place]) 03/30/2021   Chronic combined systolic and diastolic CHF (congestive heart failure) ([name])    Chronic kidney disease, stage III (moderate) ([name])    Dysrhythmia    brief episode afib, cardioverted did not return   Hypertension    Hypertensive crisis 01/20/2021   Hypertensive emergency 12/21/2020   NSTEMI (non-ST elevated myocardial infarction) ([place]) 12/21/2015   Polysubstance abuse ([place])     Medications:  PTA meds include Xarelto 20 mg daily.  Last dose unknown.  Checking baseline HL,  aPTT, & INR  Assessment: Pt is 41 yo male presenting to ED c/o SOB, found w/ elevated BNP and Troponin I lvls.  Goal of Therapy:  aPTT goal: 66-102 Heparin level 0.3-0.7 units/ml Monitor platelets by anticoagulation protocol: Yes  Date Time aPTT/HL Rate/Comment 6/16 1221 76/---  Therapeutic x1 6/16 1803 64/---  Subtherapeutic 6/17 0126 75/--  Therapeutic x 1  Plan:  Continue heparin infusion at 2000 units/hr BL HL > 1.1, will follow aPTT until correlates w/ HL Confirmatory aPTT in 6 hours HL & CBC daily while on heparin  Renda Rolls, PharmD, [facility] 02/01/2022 2:45 AM

## 2022-02-01 NOTE — Progress Notes (Signed)
Dr. Candiss Norse present and gave order to discontinue lasix drip.

## 2022-02-01 NOTE — Progress Notes (Signed)
Dr. Milon Dikes gave order to hold metoprolol dose this morning.

## 2022-02-02 ENCOUNTER — Inpatient Hospital Stay: Payer: Medicaid Other

## 2022-02-02 ENCOUNTER — Encounter: Payer: Self-pay | Admitting: Internal Medicine

## 2022-02-02 DIAGNOSIS — J9600 Acute respiratory failure, unspecified whether with hypoxia or hypercapnia: Secondary | ICD-10-CM

## 2022-02-02 LAB — COMPREHENSIVE METABOLIC PANEL
ALT: 2221 U/L — ABNORMAL HIGH (ref 0–44)
AST: 2351 U/L — ABNORMAL HIGH (ref 15–41)
Albumin: 2.6 g/dL — ABNORMAL LOW (ref 3.5–5.0)
Alkaline Phosphatase: 94 U/L (ref 38–126)
Anion gap: 12 (ref 5–15)
BUN: 56 mg/dL — ABNORMAL HIGH (ref 6–20)
CO2: 19 mmol/L — ABNORMAL LOW (ref 22–32)
Calcium: 8.4 mg/dL — ABNORMAL LOW (ref 8.9–10.3)
Chloride: 104 mmol/L (ref 98–111)
Creatinine, Ser: 5.14 mg/dL — ABNORMAL HIGH (ref 0.61–1.24)
GFR, Estimated: 14 mL/min — ABNORMAL LOW (ref >60)
Glucose, Bld: 123 mg/dL — ABNORMAL HIGH (ref 70–99)
Potassium: 3.7 mmol/L (ref 3.5–5.1)
Sodium: 135 mmol/L (ref 135–145)
Total Bilirubin: 0.9 mg/dL (ref 0.3–1.2)
Total Protein: 6.6 g/dL (ref 6.5–8.1)

## 2022-02-02 LAB — CBC WITH DIFFERENTIAL/PLATELET
Abs Immature Granulocytes: 0.06 10*3/uL (ref 0.00–0.07)
Basophils Absolute: 0.1 10*3/uL (ref 0.0–0.1)
Basophils Relative: 1 %
Eosinophils Absolute: 0.3 10*3/uL (ref 0.0–0.5)
Eosinophils Relative: 3 %
HCT: 35.2 % — ABNORMAL LOW (ref 39.0–52.0)
Hemoglobin: 10.6 g/dL — ABNORMAL LOW (ref 13.0–17.0)
Immature Granulocytes: 1 %
Lymphocytes Relative: 19 %
Lymphs Abs: 1.9 10*3/uL (ref 0.7–4.0)
MCH: 20.7 pg — ABNORMAL LOW (ref 26.0–34.0)
MCHC: 30.1 g/dL (ref 30.0–36.0)
MCV: 68.8 fL — ABNORMAL LOW (ref 80.0–100.0)
Monocytes Absolute: 0.6 10*3/uL (ref 0.1–1.0)
Monocytes Relative: 6 %
Neutro Abs: 6.9 10*3/uL (ref 1.7–7.7)
Neutrophils Relative %: 70 %
Platelets: 214 10*3/uL (ref 150–400)
RBC: 5.12 MIL/uL (ref 4.22–5.81)
RDW: 18.5 % — ABNORMAL HIGH (ref 11.5–15.5)
WBC: 9.8 10*3/uL (ref 4.0–10.5)
nRBC: 0.4 % — ABNORMAL HIGH (ref 0.0–0.2)

## 2022-02-02 LAB — HEPARIN LEVEL (UNFRACTIONATED)
Heparin Unfractionated: 0.45 IU/mL (ref 0.30–0.70)
Heparin Unfractionated: 0.48 IU/mL (ref 0.30–0.70)
Heparin Unfractionated: 0.48 IU/mL (ref 0.30–0.70)

## 2022-02-02 LAB — RENAL FUNCTION PANEL
Albumin: 2.5 g/dL — ABNORMAL LOW (ref 3.5–5.0)
Anion gap: 8 (ref 5–15)
BUN: 37 mg/dL — ABNORMAL HIGH (ref 6–20)
CO2: 23 mmol/L (ref 22–32)
Calcium: 9.1 mg/dL (ref 8.9–10.3)
Chloride: 104 mmol/L (ref 98–111)
Creatinine, Ser: 3.55 mg/dL — ABNORMAL HIGH (ref 0.61–1.24)
GFR, Estimated: 21 mL/min — ABNORMAL LOW (ref >60)
Glucose, Bld: 108 mg/dL — ABNORMAL HIGH (ref 70–99)
Phosphorus: 4.2 mg/dL (ref 2.5–4.6)
Potassium: 3.1 mmol/L — ABNORMAL LOW (ref 3.5–5.1)
Sodium: 135 mmol/L (ref 135–145)

## 2022-02-02 LAB — GLUCOSE, CAPILLARY
Glucose-Capillary: 118 mg/dL — ABNORMAL HIGH (ref 70–99)
Glucose-Capillary: 106 mg/dL — ABNORMAL HIGH (ref 70–99)
Glucose-Capillary: 127 mg/dL — ABNORMAL HIGH (ref 70–99)
Glucose-Capillary: 106 mg/dL — ABNORMAL HIGH (ref 70–99)
Glucose-Capillary: 99 mg/dL (ref 70–99)
Glucose-Capillary: 105 mg/dL — ABNORMAL HIGH (ref 70–99)

## 2022-02-02 LAB — BLOOD GAS, ARTERIAL
Acid-base deficit: 6.6 mmol/L — ABNORMAL HIGH (ref 0.0–2.0)
Bicarbonate: 20.2 mmol/L (ref 20.0–28.0)
FIO2: 60 %
MECHVT: 550 mL
Mechanical Rate: 20
O2 Saturation: 99.5 %
PEEP: 10 cmH2O
Patient temperature: 37
pCO2 arterial: 44 mmHg (ref 32–48)
pH, Arterial: 7.27 — ABNORMAL LOW (ref 7.35–7.45)
pO2, Arterial: 125 mmHg — ABNORMAL HIGH (ref 83–108)

## 2022-02-02 LAB — APTT
aPTT: 64 seconds — ABNORMAL HIGH (ref 24–36)
aPTT: 65 seconds — ABNORMAL HIGH (ref 24–36)

## 2022-02-02 LAB — MAGNESIUM: Magnesium: 2.1 mg/dL (ref 1.7–2.4)

## 2022-02-02 LAB — HEPATITIS PANEL, ACUTE
HCV Ab: NONREACTIVE
Hep A IgM: NONREACTIVE
Hep B C IgM: NONREACTIVE
Hepatitis B Surface Ag: NONREACTIVE

## 2022-02-02 LAB — PHOSPHORUS: Phosphorus: 6.8 mg/dL — ABNORMAL HIGH (ref 2.5–4.6)

## 2022-02-02 MED ORDER — SODIUM CHLORIDE 0.9 % IV SOLN
2.0000 g | Freq: Two times a day (BID) | INTRAVENOUS | Status: DC
  Administered 2022-02-02 – 2022-02-03 (×2): 2 g via INTRAVENOUS
  Filled 2022-02-02 (×2): qty 2

## 2022-02-02 MED ORDER — PANTOPRAZOLE 2 MG/ML SUSPENSION
40.0000 mg | Freq: Every day | ORAL | Status: DC
  Administered 2022-02-02 – 2022-02-14 (×13): 40 mg
  Filled 2022-02-02 (×13): qty 20

## 2022-02-02 MED ORDER — SODIUM CHLORIDE 0.9 % IV SOLN
10.0000 mg | Freq: Two times a day (BID) | INTRAVENOUS | Status: DC
  Filled 2022-02-02: qty 1

## 2022-02-02 MED ORDER — PRISMASOL BGK 4/2.5 32-4-2.5 MEQ/L REPLACEMENT SOLN
Status: DC
  Filled 2022-02-02 (×5): qty 5000

## 2022-02-02 MED ORDER — PRISMASOL BGK 4/2.5 32-4-2.5 MEQ/L EC SOLN: Status: DC

## 2022-02-02 MED ORDER — SODIUM CHLORIDE 0.9 % IV SOLN: INTRAVENOUS | Status: DC | PRN

## 2022-02-02 NOTE — Progress Notes (Signed)
[place], [place] 02/02/22  Subjective:   Hospital day # 3 Patient remains critically ill. Neuro: Sedated with propofol and fentanyl  cvs: Requiring phenylephrine, off amiodarone.  Atrial fibrillation pulm: Ventilator assisted.  FiO2 50% with 10 of PEEP gi: Tube feeds at 60 cc/h. Renal: Foley in place with minimal urine output. 06/17 0701 - 06/18 0700 In: 4342.1 [I.V.:1840.5; NG/GT:1501.8; IV Piggyback:999.8] Out: 3022 [Urine:348] Lab Results  Component Value Date   CREATININE 5.14 (H) 02/02/2022   CREATININE 4.77 (H) 02/01/2022   CREATININE 3.13 (H) 01/31/2022   Was started on CRRT last night.  Tolerating well.  Objective:  Vital signs in last 24 hours:  Temp:  [98.1 F (36.7 C)-100.6 F (38.1 C)] 98.1 F (36.7 C) (06/18 0800) Pulse Rate:  [48-92] 54 (06/18 0800) Resp:  [14-25] 20 (06/18 0800) BP: (74-119)/(60-95) 89/73 (06/17 2100) SpO2:  [87 %-100 %] 100 % (06/18 0856) Arterial Line BP: (91-131)/(58-80) 124/74 (06/18 0800) FiO2 (%):  [50 %-70 %] 50 % (06/18 0856) Weight:  [133.4 kg] 133.4 kg (06/18 0328)  Weight change: 0.8 kg Filed Weights   02/06/2022 0300 02/01/22 0337 02/02/22 0328  Weight: 131.5 kg 132.6 kg 133.4 kg    Intake/Output:    Intake/Output Summary (Last 24 hours) at 02/02/2022 0959 Last data filed at 02/02/2022 0900 Gross per 24 hour  Intake 4295.47 ml  Output 3609 ml  Net 686.47 ml      Physical Exam: General: Critically ill-appearing, laying in the bed  HEENT ET tube, OG tube in place  Pulm/lungs Ventilator assisted  CVS/Heart Atrial fibrillation, irregular  Abdomen:  Soft  Extremities: Some dependent edema present  Neurologic: Sedated  Skin: Warm, dry  Access: Right femoral dialysis catheter in place       Basic Metabolic Panel:  Recent Labs  Lab 01/16/2022 0023 02/09/2022 0638 01/31/22 0505 01/31/22 0802 02/01/22 0321 02/02/22 0317  NA 137 139 137 136 136 135  K 3.4* 3.6 3.5 4.1 4.4 3.7  CL  107 110 106 105 104 104  CO2 21* 20* 21* 22 20* 19*  GLUCOSE 113* 132* 133* 131* 97 123*  BUN 39* 35* 31* 33* 45* 56*  CREATININE 2.43* 2.22* 2.44* 3.13* 4.77* 5.14*  CALCIUM 9.3 8.8* 8.8* 9.0 8.6* 8.4*  MG 2.3 2.1 2.1  --  2.2 2.1  PHOS  --   --   --   --   --  6.8*      CBC: Recent Labs  Lab 01/17/2022 0023 01/20/2022 0175 01/31/22 0505 01/31/22 0802 02/01/22 0321 02/02/22 0317  WBC 7.9 9.7 14.1* 12.7* 11.8* 9.8  NEUTROABS 5.1 7.6  --  10.3* 8.8* 6.9  HGB 12.0* 11.3* 11.9* 10.6* 10.3* 10.6*  HCT 40.5 37.2* 39.9 35.3* 35.1* 35.2*  MCV 68.9* 67.1* 68.7* 68.7* 69.8* 68.8*  PLT 233 229 213 218 195 214      No results found for: "HEPBSAG", "HEPBSAB", "HEPBIGM"    Microbiology:  Recent Results (from the past 240 hour(s))  Blood culture (routine x 2)     Status: None (Preliminary result)   Collection Time: 01/19/2022  1:17 AM   Specimen: BLOOD  Result Value Ref Range Status   Specimen Description BLOOD LEFT HAND  Final   Special Requests   Final    BOTTLES DRAWN AEROBIC AND ANAEROBIC Blood Culture adequate volume   Culture   Final    NO GROWTH 3 DAYS Performed at [facility], [address], [place], [place] [address]  Report Status PENDING  Incomplete  Resp Panel by RT-PCR (Flu A&B, Covid) Anterior Nasal Swab     Status: None   Collection Time: 01/24/2022  1:17 AM   Specimen: Anterior Nasal Swab  Result Value Ref Range Status   SARS Coronavirus 2 by RT PCR NEGATIVE NEGATIVE Final    Comment: (NOTE) SARS-CoV-2 target nucleic acids are NOT DETECTED.  The SARS-CoV-2 RNA is generally detectable in upper respiratory specimens during the acute phase of infection. The lowest concentration of SARS-CoV-2 viral copies this assay can detect is 138 copies/mL. A negative result does not preclude SARS-Cov-2 infection and should not be used as the sole basis for treatment or other patient management decisions. A negative result may occur with  improper specimen  collection/handling, submission of specimen other than nasopharyngeal swab, presence of viral mutation(s) within the areas targeted by this assay, and inadequate number of viral copies(<138 copies/mL). A negative result must be combined with clinical observations, patient history, and epidemiological information. The expected result is Negative.  Fact Sheet for Patients:  EntrepreneurPulse.com.au  Fact Sheet for Healthcare Providers:  IncredibleEmployment.be  This test is no t yet approved or cleared by the [place] FDA and  has been authorized for detection and/or diagnosis of SARS-CoV-2 by FDA under an Emergency Use Authorization (EUA). This EUA will remain  in effect (meaning this test can be used) for the duration of the COVID-19 declaration under Section 564(b)(1) of the Act, 21 U.S.C.section 360bbb-3(b)(1), unless the authorization is terminated  or revoked sooner.       Influenza A by PCR NEGATIVE NEGATIVE Final   Influenza B by PCR NEGATIVE NEGATIVE Final    Comment: (NOTE) The Xpert Xpress SARS-CoV-2/FLU/RSV plus assay is intended as an aid in the diagnosis of influenza from Nasopharyngeal swab specimens and should not be used as a sole basis for treatment. Nasal washings and aspirates are unacceptable for Xpert Xpress SARS-CoV-2/FLU/RSV testing.  Fact Sheet for Patients: EntrepreneurPulse.com.au  Fact Sheet for Healthcare Providers: IncredibleEmployment.be  This test is not yet approved or cleared by the [place] FDA and has been authorized for detection and/or diagnosis of SARS-CoV-2 by FDA under an Emergency Use Authorization (EUA). This EUA will remain in effect (meaning this test can be used) for the duration of the COVID-19 declaration under Section 564(b)(1) of the Act, 21 U.S.C. section 360bbb-3(b)(1), unless the authorization is terminated or revoked.  Performed at [facility], [address] [place], [place] [address]   Blood culture (routine x 2)     Status: None (Preliminary result)   Collection Time: 01/29/2022  1:18 AM   Specimen: BLOOD  Result Value Ref Range Status   Specimen Description BLOOD LEFT ASSIST CONTROL  Final   Special Requests   Final    BOTTLES DRAWN AEROBIC AND ANAEROBIC Blood Culture adequate volume   Culture   Final    NO GROWTH 3 DAYS Performed at [facility], [address], [place], [place] [address]    Report Status PENDING  Incomplete  MRSA Next Gen by PCR, Nasal     Status: None   Collection Time: 01/16/2022  3:01 AM   Specimen: Nasal Mucosa; Nasal Swab  Result Value Ref Range Status   MRSA by PCR Next Gen NOT DETECTED NOT DETECTED Final    Comment: (NOTE) The GeneXpert MRSA Assay (FDA approved for NASAL specimens only), is one component of a comprehensive MRSA colonization surveillance program. It is not intended to diagnose MRSA infection  nor to guide or monitor treatment for MRSA infections. Test performance is not FDA approved in patients less than 71 years old. Performed at [facility], [address] [place], [place] [address]   Expectorated Sputum Assessment w Gram Stain, Rflx to Resp Cult     Status: None   Collection Time: 01/27/2022  6:07 AM   Specimen: Expectorated Sputum  Result Value Ref Range Status   Specimen Description EXPECTORATED SPUTUM  Final   Special Requests NONE  Final   Sputum evaluation   Final    THIS SPECIMEN IS ACCEPTABLE FOR SPUTUM CULTURE Performed at [facility], [address] [place], [place] [address]    Report Status 01/21/2022 FINAL  Final  Culture, Respiratory w Gram Stain     Status: None   Collection Time: 01/17/2022  6:07 AM  Result Value Ref Range Status   Specimen Description   Final    EXPECTORATED SPUTUM Performed at [facility], [address] [place], [place] [address]    Special Requests   Final    NONE Reflexed  from [id number] Performed at [facility], [address] [place], [place] [address]    Gram Stain   Final    RARE WBC PRESENT, PREDOMINANTLY PMN RARE GRAM POSITIVE COCCI IN PAIRS    Culture   Final    FEW Normal respiratory flora-no Staph aureus or Pseudomonas seen Performed at [facility], [place] [address] [place],  [address]    Report Status 02/01/2022 FINAL  Final    Coagulation Studies: No results for input(s): "LABPROT", "INR" in the last 72 hours.   Urinalysis: No results for input(s): "COLORURINE", "LABSPEC", "PHURINE", "GLUCOSEU", "HGBUR", "BILIRUBINUR", "KETONESUR", "PROTEINUR", "UROBILINOGEN", "NITRITE", "LEUKOCYTESUR" in the last 72 hours.  Invalid input(s): "APPERANCEUR"     Imaging: DG Chest Port 1 View  Result Date: 02/02/2022 CLINICAL DATA:  Acute respiratory failure with hypoxia. EXAM: PORTABLE CHEST 1 VIEW COMPARISON:  02/01/2022 FINDINGS: Enteric tube courses into the region of the stomach and off the film as tip is not visualized. Endotracheal tube is unchanged. Lungs are hypoinflated with stable mild hazy opacification in the left base/retrocardiac region. Mild hazy density over the right lung unchanged. Stable cardiomegaly. Remainder of the exam is unchanged. IMPRESSION: 1. Hypoinflation with stable hazy density over the right lung and left base/retrocardiac region. 2. Stable cardiomegaly. 3. Tubes and lines as described. Electronically Signed   By: Marin Olp M.D.   On: 02/02/2022 08:39   DG Chest Port 1 View  Result Date: 02/01/2022 CLINICAL DATA:  Endotracheal tube placement. EXAM: PORTABLE CHEST 1 VIEW COMPARISON:  02/01/2022 FINDINGS: Endotracheal tube has tip 6.6 cm above the carina. Enteric tube courses into the region of the stomach and off the film as tip is not visualized. Lungs are hypoinflated with persistent slight elevation of the right hemidiaphragm. Mild patchy hazy density over the right lung unchanged which may be due to  asymmetric edema or infection. Mild stable hazy density left retrocardiac region. Stable cardiomegaly. Remainder of the exam is unchanged. IMPRESSION: 1. Hypoinflation with stable patchy hazy density over the right lung and left retrocardiac region. Stable cardiomegaly. 2. Tubes and lines as described. Electronically Signed   By: Marin Olp M.D.   On: 02/01/2022 10:48   DG Chest Port 1 View  Result Date: 02/01/2022 CLINICAL DATA:  Cardiac arrest. EXAM: PORTABLE CHEST 1 VIEW COMPARISON:  Portable chest yesterday at 6:38 a.m. FINDINGS: ETT tip is 5.3 cm from  the carina, NGT adequately intragastric. The heart silhouette severely enlarged. There is perihilar vascular congestion central vascular prominence and patchy consolidation throughout the right lung fields and left lower lung field consistent with pneumonia, edema or combination and overlying small to moderate-sized pleural effusions. There is moderate interval clearing in the periphery of the right lung compared with yesterday's exam with no other change in the lung opacities. There is no new or worsening abnormality. In all other respects no further changes. IMPRESSION: 1. Cardiomegaly and persistent central vascular prominence. 2. Patchy consolidation in the right lung fields, left lower lung field with improvement in aeration of the peripheral right lung fields and otherwise unchanged. 3. Bilateral pleural effusions. Electronically Signed   By: Telford Nab M.D.   On: 02/01/2022 07:21   US RENAL  Result Date: 01/31/2022 CLINICAL DATA:  Acute kidney injury. EXAM: RENAL / URINARY TRACT ULTRASOUND COMPLETE COMPARISON:  Renal ultrasound 04/06/2019 FINDINGS: Right Kidney: Renal measurements: 12.2 x 5.2 x 5.1 cm = volume: 167 mL. The renal parenchyma is hyperechoic to the index organ, the liver. No focal lesions are present. No stone or obstruction. Left Kidney: Renal measurements: 11.1 x 4.9 x 5.4 cm = volume: 156 mL. The renal parenchyma is  hyperechoic to the index organ, the liver. No focal lesions are present. No stone or obstruction. Bladder: Not visualized Other: None. IMPRESSION: The kidneys are hyperechoic bilaterally without focal lesion or obstruction. This is nonspecific, but can be seen in the setting of medical renal disease. Echogenicity has increased since the prior exam. Electronically Signed   By: San Morelle M.D.   On: 01/31/2022 19:48     Medications:    ceFEPime (MAXIPIME) IV Stopped (02/01/22 2239)   famotidine (PEPCID) IV Stopped (02/01/22 2202)   feeding supplement (VITAL HIGH PROTEIN) 60 mL/hr at 02/02/22 0900   fentaNYL infusion INTRAVENOUS 125 mcg/hr (02/02/22 0900)   heparin 2,100 Units/hr (02/02/22 0900)   linezolid (ZYVOX) IV Stopped (02/01/22 2353)   metronidazole 500 mg (02/02/22 0912)   phenylephrine (NEO-SYNEPHRINE) Adult infusion 40 mcg/min (02/02/22 0900)   prismasol BGK 2/2.5 dialysis solution 1,500 mL/hr at 02/02/22 0606   prismasol BGK 2/2.5 replacement solution 300 mL/hr at 02/01/22 1943   prismasol BGK 2/2.5 replacement solution 300 mL/hr at 02/01/22 1943   propofol (DIPRIVAN) infusion 15 mcg/kg/min (02/02/22 0900)    Chlorhexidine Gluconate Cloth  6 each Topical Q0600   feeding supplement (PROSource TF)  45 mL Per Tube TID   free water  30 mL Per Tube Q4H   insulin aspart  0-6 Units Subcutaneous Q4H   lidocaine  1 patch Transdermal Q24H   mouth rinse  15 mL Mouth Rinse Q2H   acetaminophen, guaiFENesin-dextromethorphan, heparin, morphine injection, mouth rinse, mouth rinse, sodium chloride  Assessment/ Plan:  41 y.o. male with   chronic systolic and diastolic CHF, hypertension, anemia, polysubstance abuse (cocaine and tobacco), chronic kidney disease, non-STEMI May 2023, A-fib with RVR admitted on 01/18/2022 for Acute respiratory failure ([name]) [J96.00] Acute respiratory failure with hypoxia ([name]) [J96.01] Atrial fibrillation with RVR ([place]) [I48.91] Community acquired  pneumonia of right lung, unspecified part of lung [J18.9] Acute on chronic congestive heart failure, unspecified heart failure type ([place]) [I50.9] Acute respiratory failure with hypoxemia ([place]) [J96.01]  Acute kidney injury on chronic kidney disease stage IIIb. Baseline creatinine 2.03/GFR 42 on November 04, 2021. Renal ultrasound without obstruction.  No IV contrast exposure.AKI likely secondary to cardiorenal syndrome.  Patient was started on CRRT the evening of February 01, 2022.  Tolerating well.  Will increase the dialysis dose today.  Also increase UF rate to 100 cc/h.  Acute respiratory failure Currently ventilator assisted.   Getting IV cefepime, linezolid, metronidazole for community-acquired pneumonia  Acute exacerbation of chronic systolic and diastolic CHF 2D echo September 05, 2021-severe global reduction in LV function, severe LVH, EF 20 to 25%, global hypokinesis, grade 3 diastolic dysfunction     LOS: [place] 6/18/20239:59 AM  [place], [place]  Note: This note was prepared with Dragon dictation. Any transcription errors are unintentional

## 2022-02-02 NOTE — Progress Notes (Signed)
Progress Note  Patient Name: Gerald [name] Date of Encounter: 02/02/2022  [facility] Cardiologist: Ida Rogue, MD   Subjective   Intubated, sedated, renal function worsening, started on dialysis today.  LFTs also worsening.  Statin and amiodarone being held.  Inpatient Medications    Scheduled Meds:  Chlorhexidine Gluconate Cloth  6 each Topical Q0600   feeding supplement (PROSource TF)  45 mL Per Tube TID   free water  30 mL Per Tube Q4H   insulin aspart  0-6 Units Subcutaneous Q4H   lidocaine  1 patch Transdermal Q24H   mouth rinse  15 mL Mouth Rinse Q2H   Continuous Infusions:  ceFEPime (MAXIPIME) IV     famotidine (PEPCID) IV     feeding supplement (VITAL HIGH PROTEIN) 60 mL/hr at 02/02/22 1200   fentaNYL infusion INTRAVENOUS 125 mcg/hr (02/02/22 1200)   heparin 2,100 Units/hr (02/02/22 1200)   linezolid (ZYVOX) IV Stopped (02/02/22 1119)   metronidazole Stopped (02/02/22 1012)   phenylephrine (NEO-SYNEPHRINE) Adult infusion 40 mcg/min (02/02/22 1200)   prismasol BGK 2/2.5 dialysis solution 2,500 mL/hr at 02/02/22 1012   prismasol BGK 2/2.5 replacement solution 500 mL/hr at 02/02/22 1012   prismasol BGK 2/2.5 replacement solution 500 mL/hr at 02/02/22 1012   propofol (DIPRIVAN) infusion 15 mcg/kg/min (02/02/22 1200)   PRN Meds: acetaminophen, guaiFENesin-dextromethorphan, heparin, morphine injection, mouth rinse, mouth rinse, sodium chloride   Vital Signs    Vitals:   02/02/22 1000 02/02/22 1100 02/02/22 1128 02/02/22 1200  BP:      Pulse: (!) 53 (!) 59  71  Resp: 20 20  20   Temp: 98.1 F (36.7 C) 97.9 F (36.6 C)  98.1 F (36.7 C)  TempSrc:      SpO2: 100% 100% 100% 100%  Weight:      Height:        Intake/Output Summary (Last 24 hours) at 02/02/2022 1217 Last data filed at 02/02/2022 1200 Gross per 24 hour  Intake 4262.86 ml  Output 4912 ml  Net -649.14 ml      02/02/2022    3:28 AM 02/01/2022    3:37 AM 01/27/2022    3:00 AM  Last 3  Weights  Weight (lbs) 294 lb 1.5 oz 292 lb 5.3 oz 290 lb  Weight (kg) 133.4 kg 132.6 kg 131.543 kg      Telemetry    Atrial fibrillation, heart rate 60- Personally Reviewed  ECG     - Personally Reviewed  Physical Exam   GEN: Intubated, sedated Neck: Unable to assess for JVD Cardiac: Irregular irregular Respiratory: Vented breath sounds GI: Soft, nontender, non-distended  MS: 1+ edema Neuro: Intubated, sedated Psych: Unable to assess  Labs    High Sensitivity Troponin:   Recent Labs  Lab 01/18/2022 0023 02/02/2022 0153 01/22/2022 0537 02/06/2022 0638  TROPONINIHS 553* 472* 500* 450*     Chemistry Recent Labs  Lab 01/31/22 0505 01/31/22 0802 02/01/22 0321 02/02/22 0317  NA 137 136 136 135  K 3.5 4.1 4.4 3.7  CL 106 105 104 104  CO2 21* 22 20* 19*  GLUCOSE 133* 131* 97 123*  BUN 31* 33* 45* 56*  CREATININE 2.44* 3.13* 4.77* 5.14*  CALCIUM 8.8* 9.0 8.6* 8.4*  MG 2.1  --  2.2 2.1  PROT  --  6.8 6.4* 6.6  ALBUMIN  --  3.0* 2.6* 2.6*  AST  --  21 256* 2,351*  ALT  --  26 270* 2,221*  ALKPHOS  --  64  63 94  BILITOT  --  0.8 1.2 0.9  GFRNONAA 33* 25* 15* 14*  ANIONGAP 10 9 12 12     Lipids  Recent Labs  Lab 02/01/22 0321  TRIG 68    Hematology Recent Labs  Lab 01/31/22 0802 02/01/22 0321 02/02/22 0317  WBC 12.7* 11.8* 9.8  RBC 5.14 5.03 5.12  HGB 10.6* 10.3* 10.6*  HCT 35.3* 35.1* 35.2*  MCV 68.7* 69.8* 68.8*  MCH 20.6* 20.5* 20.7*  MCHC 30.0 29.3* 30.1  RDW 18.2* 18.6* 18.5*  PLT 218 195 214   Thyroid  Recent Labs  Lab 02/08/2022 0638  TSH 0.920    BNP Recent Labs  Lab 01/18/2022 0023  BNP 793.1*    DDimer  Recent Labs  Lab 01/29/2022 0638  DDIMER 0.40     Radiology    DG Chest Port 1 View  Result Date: 02/02/2022 CLINICAL DATA:  Acute respiratory failure with hypoxia. EXAM: PORTABLE CHEST 1 VIEW COMPARISON:  02/01/2022 FINDINGS: Enteric tube courses into the region of the stomach and off the film as tip is not visualized.  Endotracheal tube is unchanged. Lungs are hypoinflated with stable mild hazy opacification in the left base/retrocardiac region. Mild hazy density over the right lung unchanged. Stable cardiomegaly. Remainder of the exam is unchanged. IMPRESSION: 1. Hypoinflation with stable hazy density over the right lung and left base/retrocardiac region. 2. Stable cardiomegaly. 3. Tubes and lines as described. Electronically Signed   By: Marin Olp M.D.   On: 02/02/2022 08:39   DG Chest Port 1 View  Result Date: 02/01/2022 CLINICAL DATA:  Endotracheal tube placement. EXAM: PORTABLE CHEST 1 VIEW COMPARISON:  02/01/2022 FINDINGS: Endotracheal tube has tip 6.6 cm above the carina. Enteric tube courses into the region of the stomach and off the film as tip is not visualized. Lungs are hypoinflated with persistent slight elevation of the right hemidiaphragm. Mild patchy hazy density over the right lung unchanged which may be due to asymmetric edema or infection. Mild stable hazy density left retrocardiac region. Stable cardiomegaly. Remainder of the exam is unchanged. IMPRESSION: 1. Hypoinflation with stable patchy hazy density over the right lung and left retrocardiac region. Stable cardiomegaly. 2. Tubes and lines as described. Electronically Signed   By: Marin Olp M.D.   On: 02/01/2022 10:48   DG Chest Port 1 View  Result Date: 02/01/2022 CLINICAL DATA:  Cardiac arrest. EXAM: PORTABLE CHEST 1 VIEW COMPARISON:  Portable chest yesterday at 6:38 a.m. FINDINGS: ETT tip is 5.3 cm from the carina, NGT adequately intragastric. The heart silhouette severely enlarged. There is perihilar vascular congestion central vascular prominence and patchy consolidation throughout the right lung fields and left lower lung field consistent with pneumonia, edema or combination and overlying small to moderate-sized pleural effusions. There is moderate interval clearing in the periphery of the right lung compared with yesterday's exam with  no other change in the lung opacities. There is no new or worsening abnormality. In all other respects no further changes. IMPRESSION: 1. Cardiomegaly and persistent central vascular prominence. 2. Patchy consolidation in the right lung fields, left lower lung field with improvement in aeration of the peripheral right lung fields and otherwise unchanged. 3. Bilateral pleural effusions. Electronically Signed   By: Telford Nab M.D.   On: 02/01/2022 07:21   US RENAL  Result Date: 01/31/2022 CLINICAL DATA:  Acute kidney injury. EXAM: RENAL / URINARY TRACT ULTRASOUND COMPLETE COMPARISON:  Renal ultrasound 04/06/2019 FINDINGS: Right Kidney: Renal measurements: 12.2 x 5.2 x 5.1  cm = volume: 167 mL. The renal parenchyma is hyperechoic to the index organ, the liver. No focal lesions are present. No stone or obstruction. Left Kidney: Renal measurements: 11.1 x 4.9 x 5.4 cm = volume: 156 mL. The renal parenchyma is hyperechoic to the index organ, the liver. No focal lesions are present. No stone or obstruction. Bladder: Not visualized Other: None. IMPRESSION: The kidneys are hyperechoic bilaterally without focal lesion or obstruction. This is nonspecific, but can be seen in the setting of medical renal disease. Echogenicity has increased since the prior exam. Electronically Signed   By: San Morelle M.D.   On: 01/31/2022 19:48    Cardiac Studies   Echocardiogram 08/2021  EF 20 to 25%, severe concentric LVH,  Patient Profile     41 y.o. male with history of nonischemic cardiomyopathy, polysubstance use, obesity presenting with worsening shortness of breath and respiratory failure leading to PEA arrest s/p CPR and epi with ROSC.  being seen for CHF and A-fib RVR.  Current clinical picture indicates multiorgan failure with elevated LFTs, rising creatinine.  Assessment & Plan    Nonischemic cardiomyopathy EF 25% -Not a candidate for any advanced heart failure therapy such as LVAD or  transplant -Continue supportive care with pressors -Volume control with HD   2.  Persistent atrial fibrillation -Heart rate controlled, continue heparin drip -Amiodarone held due to bradycardia and worsening LFTs  3.  Multiorgan failure, elevated LFTs, worsening creatinine -Appreciate input from nephrology -Started on dialysis last evening.  4.  Respiratory failure -Intubated, on antibiotics as per ICU team  Patient is experiencing multiorgan failure, prognosis remains guarded.   Total encounter time more than 50 minutes  Greater than 50% was spent in counseling and coordination of care with the patient     Signed, Kate Sable, MD  02/02/2022, 12:17 PM

## 2022-02-02 NOTE — Progress Notes (Signed)
ANTICOAGULATION CONSULT NOTE   Pharmacy Consult for Heparin Infusion Indication: ACS/STEMI  Patient Measurements: Height: 6' (182.9 cm) Weight: 133.4 kg (294 lb 1.5 oz) IBW/kg (Calculated) : 77.6 Heparin Dosing Weight: 109.9 kg  Labs: Recent Labs    01/31/22 0802 01/31/22 1221 02/01/22 0321 02/01/22 0755 02/02/22 0317 02/02/22 1504 02/02/22 2012 02/02/22 2306  HGB 10.6*  --  10.3*  --  10.6*  --   --   --   HCT 35.3*  --  35.1*  --  35.2*  --   --   --   PLT 218  --  195  --  214  --   --   --   APTT  --    < >  --  81* 64* 65*  --   --   HEPARINUNFRC  --   --   --  0.55 0.45 0.48  --  0.48  CREATININE 3.13*  --  4.77*  --  5.14*  --  3.55*  --    < > = values in this interval not displayed.     Estimated Creatinine Clearance: 38.7 mL/min (A) (by C-G formula based on SCr of 3.55 mg/dL (H)).   Medical History: Past Medical History:  Diagnosis Date   Acute on chronic combined systolic and diastolic CHF (congestive heart failure) ([name])    Acute on chronic systolic CHF (congestive heart failure) ([place]) 32/54/9826   Acute systolic congestive heart failure ([name])    Atrial flutter, paroxysmal ([name])    CHF (congestive heart failure) ([place]) 03/30/2021   Chronic combined systolic and diastolic CHF (congestive heart failure) ([name])    Chronic kidney disease, stage III (moderate) ([name])    Dysrhythmia    brief episode afib, cardioverted did not return   Hypertension    Hypertensive crisis 01/20/2021   Hypertensive emergency 12/21/2020   NSTEMI (non-ST elevated myocardial infarction) ([place]) 12/21/2015   Polysubstance abuse ([place])     Medications:  PTA meds include Xarelto 20 mg daily.  Last dose unknown.  Checking baseline HL, aPTT, & INR  Assessment: Pt is 41 yo male presenting to ED c/o SOB, found w/ elevated BNP and Troponin I lvls.  Goal of Therapy:  aPTT goal: 66-102 Heparin level 0.3-0.7 units/ml Monitor platelets by anticoagulation protocol:  Yes  Date Time aPTT/HL Rate/Comment 6/16 1221 76/---  Therapeutic x1 6/16 1803 64/---  Subtherapeutic 6/17 0126 75/--  Therapeutic x 1 6/17 0755 81/0.55 Therapeutic.  6/18 0317 64/0.45 aPTT subthera/HL thera 6/18 1504 65/0.48 HL Therapeutic  6/18 2306 0.48  Therapeutic x 2  Plan:  Continue heparin infusion at 2100 units/hr.  Recheck HL daily w/ AM labs while therapeutic CBC daily while on heparin.  Renda Rolls, PharmD, [facility] 02/02/2022 11:35 PM

## 2022-02-02 NOTE — Progress Notes (Signed)
  CRRT-Pharmacy monitoring  Pharmacy consulted to review medications for adjustment  CRRT initiated 6/17 PM   Recommendations -Adjust cefepime to 2 grams every 12 hours -Adjust famotidine to 10 mg every 12 hours   Wynelle Cleveland, PharmD Pharmacy Resident  02/02/2022 10:48 AM

## 2022-02-02 NOTE — Progress Notes (Signed)
ANTICOAGULATION CONSULT NOTE   Pharmacy Consult for Heparin Infusion Indication: ACS/STEMI  Patient Measurements: Height: 6' (182.9 cm) Weight: 133.4 kg (294 lb 1.5 oz) IBW/kg (Calculated) : 77.6 Heparin Dosing Weight: 109.9 kg  Labs: Recent Labs    02/03/2022 0638 01/26/2022 1343 01/31/22 0505 01/31/22 0802 01/31/22 1221 02/01/22 0126 02/01/22 0321 02/01/22 0755 02/02/22 0317  HGB 11.3*  --  11.9* 10.6*  --   --  10.3*  --  10.6*  HCT 37.2*  --  39.9 35.3*  --   --  35.1*  --  35.2*  PLT 229  --  213 218  --   --  195  --  214  APTT 70*   < > 59*  --    < > 75*  --  81* 64*  HEPARINUNFRC  --   --  1.07*  --   --   --   --  0.55 0.45  CREATININE 2.22*  --  2.44* 3.13*  --   --  4.77*  --  5.14*  TROPONINIHS 450*  --   --   --   --   --   --   --   --    < > = values in this interval not displayed.     Estimated Creatinine Clearance: 26.7 mL/min (A) (by C-G formula based on SCr of 5.14 mg/dL (H)).   Medical History: Past Medical History:  Diagnosis Date   Acute on chronic combined systolic and diastolic CHF (congestive heart failure) ([name])    Acute on chronic systolic CHF (congestive heart failure) ([place]) 68/03/8109   Acute systolic congestive heart failure ([name])    Atrial flutter, paroxysmal ([name])    CHF (congestive heart failure) ([place]) 03/30/2021   Chronic combined systolic and diastolic CHF (congestive heart failure) ([name])    Chronic kidney disease, stage III (moderate) ([name])    Dysrhythmia    brief episode afib, cardioverted did not return   Hypertension    Hypertensive crisis 01/20/2021   Hypertensive emergency 12/21/2020   NSTEMI (non-ST elevated myocardial infarction) ([place]) 12/21/2015   Polysubstance abuse ([place])     Medications:  PTA meds include Xarelto 20 mg daily.  Last dose unknown.  Checking baseline HL, aPTT, & INR  Assessment: Pt is 41 yo male presenting to ED c/o SOB, found w/ elevated BNP and Troponin I lvls.  Goal of Therapy:  aPTT goal:  66-102 Heparin level 0.3-0.7 units/ml Monitor platelets by anticoagulation protocol: Yes  Date Time aPTT/HL Rate/Comment 6/16 1221 76/---  Therapeutic x1 6/16 1803 64/---  Subtherapeutic 6/17 0126 75/--  Therapeutic x 1 6/17 0755 81/0.55 Therapeutic.  6/18 0317 64/0.45 aPTT subthera/HL thera  Plan:  Will increase heparin infusion slightly to 2100 units/hr. Recheck aPTT & HL in 8 hrs.  Switch to heparin level if correlation between aPTT and heparin level is confirmed CBC daily while on heparin.  Renda Rolls, PharmD, [facility] 02/02/2022 6:16 AM

## 2022-02-02 NOTE — Progress Notes (Signed)
NAME:  Gerald [name], MRN:  [id number], DOB:  12/03/1980, LOS: 3 ADMISSION DATE:  01/24/2022 CONSULTATION DATE:  02/02/2022    History of Present Illness:  41 y.o. male with history of chronic systolic and diastolic CHF last EF measured was 20 to 25% in January 2023 with history of hypertension, chronic kidney disease stage III baseline creatinine around 2,  anemia polysubstance abuse admits to taking cocaine presents to the ER because of worsening shortness of breath for the last 3 days with no associated chest pain has some productive cough.   Patient states he has not taken his medicines for last 3 days.  Admitted to [facility] with SOB    ED Course: In the ER patient was hypoxic initially requiring BiPAP but after placing on BiPAP patient had vomiting and had to be taken off the BiPAP was placed on high flow oxygen.  Patient chest x-ray shows congestion was placed on IV Lasix and also empirically on antibiotics.   BNP 790 high sensitive troponin was 553 and 4 and 72 was started on IV heparin.  Patient admitted for acute hypoxic respiratory failure likely from CHF.  Since patient was in A-fib with RVR initially was started on Cardizem infusion.  Transition to amiodarone.  PCCM consulted to assess worsening SOB and increased WOB today 6/15     Pertinent  Medical History   Active Ambulatory Problems    Diagnosis Date Noted   Community acquired pneumonia of right lung 12/21/2015   Acute respiratory distress 04/01/2019   Acute on chronic combined systolic and diastolic CHF (congestive heart failure) ([name])    Atrial flutter ([name])    Essential hypertension    PAF (paroxysmal atrial fibrillation) ([place]) 07/05/2020   Elevated troponin 07/05/2020   Microcytic anemia 07/05/2020   Hypokalemia 07/05/2020   Hypertensive emergency 12/21/2020   Stage 3a chronic kidney disease (CKD) ([place]) - baseline SCr 1.8-2.0 12/21/2020   Cocaine abuse ([place]) 01/20/2021   Obesity, Class III, BMI 40-49.9 (morbid  obesity) ([place]) 01/20/2021   Acute on chronic congestive heart failure ([place]) 03/30/2021   Restless leg 07/08/2021   Severe uncontrolled hypertension 09/05/2021   Atrial fibrillation with rapid ventricular response ([place]) 11/02/2021   Resolved Ambulatory Problems    Diagnosis Date Noted   NSTEMI (non-ST elevated myocardial infarction) ([place]) 12/21/2015   Persistent atrial fibrillation ([name])    Acute systolic congestive heart failure ([name])    Acute on chronic systolic CHF (congestive heart failure) ([place]) 07/05/2020   Hypertensive urgency 07/05/2020   Hypertensive crisis 09/38/1829   Acute diastolic CHF (congestive heart failure) ([place]) 03/30/2021   Hemoptysis 07/07/2021   Past Medical History:  Diagnosis Date   Atrial flutter, paroxysmal ([name])    CHF (congestive heart failure) ([place]) 03/30/2021   Chronic combined systolic and diastolic CHF (congestive heart failure) ([place])    Chronic kidney disease, stage III (moderate) ([place])    Dysrhythmia    Hypertension    Polysubstance abuse ([place])      Significant Hospital Events: Including procedures, antibiotic start and stop dates in addition to other pertinent events   6/14 admitted for acute sCHF 6/15 PCCM consulted for impending resp failure February 08, 2023 patient coded for 1 round with the intubation tried from respiratory arrest 06/17 patient is anuric dialysis catheter is and nephrology is involved he might need CRRT or HD 06/18 increase in liver enzymes to above 1000 we will hold amiodarone and atorvastatin    Micro Data:  COVID NEG  Antimicrobials:  Antibiotics Given (last 72 hours)     Date/Time Action Medication Dose Rate   01/17/2022 1808 New Bag/Given   doxycycline (VIBRAMYCIN) 100 mg in sodium chloride 0.9 % 250 mL IVPB 100 mg 125 mL/hr   01/31/22 0003 New Bag/Given   cefTRIAXone (ROCEPHIN) 2 g in sodium chloride 0.9 % 100 mL IVPB 2 g 200 mL/hr   01/31/22 1002 New Bag/Given   ceFEPIme (MAXIPIME) 2 g in sodium chloride 0.9 % 100 mL  IVPB 2 g 200 mL/hr   01/31/22 1003 New Bag/Given   metroNIDAZOLE (FLAGYL) IVPB 500 mg 500 mg 100 mL/hr   01/31/22 1133 New Bag/Given   linezolid (ZYVOX) IVPB 600 mg 600 mg 300 mL/hr   01/31/22 2009 New Bag/Given   metroNIDAZOLE (FLAGYL) IVPB 500 mg 500 mg 100 mL/hr   01/31/22 2214 New Bag/Given   ceFEPIme (MAXIPIME) 2 g in sodium chloride 0.9 % 100 mL IVPB 2 g 200 mL/hr   01/31/22 2256 New Bag/Given   linezolid (ZYVOX) IVPB 600 mg 600 mg 300 mL/hr   02/01/22 0856 New Bag/Given   metroNIDAZOLE (FLAGYL) IVPB 500 mg 500 mg 100 mL/hr   02/01/22 1036 New Bag/Given   linezolid (ZYVOX) IVPB 600 mg 600 mg 300 mL/hr   02/01/22 2008 New Bag/Given   metroNIDAZOLE (FLAGYL) IVPB 500 mg 500 mg 100 mL/hr   02/01/22 2208 New Bag/Given   ceFEPIme (MAXIPIME) 2 g in sodium chloride 0.9 % 100 mL IVPB 2 g 200 mL/hr   02/01/22 2253 New Bag/Given   linezolid (ZYVOX) IVPB 600 mg 600 mg 300 mL/hr   02/02/22 0912 New Bag/Given   metroNIDAZOLE (FLAGYL) IVPB 500 mg 500 mg 100 mL/hr            Interim History / Subjective:  Intubated sedated high risk of decompensation anasarca     Objective   Blood pressure (!) 89/73, pulse (!) 54, temperature 98.1 F (36.7 C), resp. rate 20, height 6' (1.829 m), weight 133.4 kg, SpO2 100 %.    Vent Mode: PRVC FiO2 (%):  [50 %-70 %] 50 % Set Rate:  [20 bmp] 20 bmp Vt Set:  [550 mL] 550 mL PEEP:  [10 cmH20] 10 cmH20 Plateau Pressure:  [25 cmH20] 25 cmH20   Intake/Output Summary (Last 24 hours) at 02/02/2022 0925 Last data filed at 02/02/2022 0900 Gross per 24 hour  Intake 4325.47 ml  Output 3609 ml  Net 716.47 ml    Filed Weights   01/23/2022 0300 02/01/22 0337 02/02/22 0328  Weight: 131.5 kg 132.6 kg 133.4 kg      REVIEW OF SYSTEMS Unable to obtain due to patient condition    PHYSICAL EXAMINATION:  GENERAL: Intubated sedated EYES: Pupils equal, round, reactive to light.  No scleral icterus.  MOUTH: Moist mucosal membrane.  NECK: Supple.   PULMONARY: +rhonchi, +wheezing CARDIOVASCULAR: S1 and S2.  No murmurs  GASTROINTESTINAL: Soft, nontender, -distended. Positive bowel sounds.  MUSCULOSKELETAL: No swelling, clubbing, or edema.  NEUROLOGIC: Intubated sedated SKIN:intact,warm,dry     Labs/imaging that I havepersonally reviewed  (right click and "Reselect all SmartList Selections" daily)        ASSESSMENT AND PLAN SYNOPSIS  40 yo morbidly obese AAM with acute sCHF exacerbation with acute hypoxic resp failure with pulm edema and renal failure   Severe ACUTE Hypoxic and Hypercapnic Respiratory Failure On the ventilator Vent Mode: PRVC FiO2 (%):  [50 %-70 %] 50 % Set Rate:  [20 bmp] 20 bmp Vt Set:  [550 mL] 550 mL PEEP:  [  [place] Pressure:  [25 cmH20] 25 cmH20   CARDIAC FAILURE-acute systolic dysfunction with AFIB   - 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 -follow up cardiac enzymes as indicated Follow up cardiology recs Continue AMIO AND HEP infusion Due to increasing liver enzymes we will go ahead and hold amiodarone and statin   CARDIAC ICU monitoring   ACUTE KIDNEY INJURY/Renal Failure -continue Foley Catheter-assess need -Avoid nephrotoxic agents -Follow urine output, BMP -Ensure adequate renal perfusion, optimize oxygenation -Renal dose medications -Patient failed high-dose Lasix drip he is to go for dialysis with urgency if needed nephrology is following nonresponsive AKI to diuretics will need CRRT for removal of fluid -Continue with CRRT      Intake/Output Summary (Last 24 hours) at 02/02/2022 0925 Last data filed at 02/02/2022 0900 Gross per 24 hour  Intake 4325.47 ml  Output 3609 ml  Net 716.47 ml     ENDO - ICU hypoglycemic\Hyperglycemia protocol -check FSBS per protocol   GI GI PROPHYLAXIS as indicated GI Increase in liver enzymes we will check acute hepatic panel and hold amiodarone and statin  NUTRITIONAL STATUS DIET-->NPO Constipation  protocol as indicated   ELECTROLYTES -follow labs as needed -replace as needed -pharmacy consultation and following   ACUTE ANEMIA- TRANSFUSE AS NEEDED CONSIDER TRANSFUSION  IF HGB<7      Best practice (right click and "Reselect all SmartList Selections" daily)  Diet: Trickle feed DVT prophylaxis: Systemic AC Mobility:  bed rest  Code Status:  FULL Disposition:ICU  Labs   CBC: Recent Labs  Lab 02/09/2022 0023 02/13/2022 8850 01/31/22 0505 01/31/22 0802 02/01/22 0321 02/02/22 0317  WBC 7.9 9.7 14.1* 12.7* 11.8* 9.8  NEUTROABS 5.1 7.6  --  10.3* 8.8* 6.9  HGB 12.0* 11.3* 11.9* 10.6* 10.3* 10.6*  HCT 40.5 37.2* 39.9 35.3* 35.1* 35.2*  MCV 68.9* 67.1* 68.7* 68.7* 69.8* 68.8*  PLT 233 229 213 218 195 214     Basic Metabolic Panel: Recent Labs  Lab 01/29/2022 0023 02/05/2022 0638 01/31/22 0505 01/31/22 0802 02/01/22 0321 02/02/22 0317  NA 137 139 137 136 136 135  K 3.4* 3.6 3.5 4.1 4.4 3.7  CL 107 110 106 105 104 104  CO2 21* 20* 21* 22 20* 19*  GLUCOSE 113* 132* 133* 131* 97 123*  BUN 39* 35* 31* 33* 45* 56*  CREATININE 2.43* 2.22* 2.44* 3.13* 4.77* 5.14*  CALCIUM 9.3 8.8* 8.8* 9.0 8.6* 8.4*  MG 2.3 2.1 2.1  --  2.2 2.1  PHOS  --   --   --   --   --  6.8*    GFR: Estimated Creatinine Clearance: 26.7 mL/min (A) (by C-G formula based on SCr of 5.14 mg/dL (H)). Recent Labs  Lab 02/03/2022 0117 02/02/2022 0153 01/25/2022 0537 01/20/2022 2774 01/31/22 0505 01/31/22 0802 02/01/22 0321 02/02/22 0317  PROCALCITON  --   --  0.19  --   --   --   --   --   WBC  --   --   --    < > 14.1* 12.7* 11.8* 9.8  LATICACIDVEN 1.2 1.0  --   --   --   --   --   --    < > = values in this interval not displayed.     Liver Function Tests: Recent Labs  Lab 02/05/2022 0023 02/04/2022 1287 01/31/22 0802 02/01/22 0321 02/02/22 0317  AST 21 21 21  256* 2,351*  ALT 34 32 26 270* 2,221*  ALKPHOS 84 80  64 63 94  BILITOT 1.5* 1.5* 0.8 1.2 0.9  PROT 8.0 7.6 6.8 6.4* 6.6  ALBUMIN  3.7 3.4* 3.0* 2.6* 2.6*    No results for input(s): "LIPASE", "AMYLASE" in the last 168 hours. Recent Labs  Lab 01/31/22 0505  AMMONIA 23     ABG    Component Value Date/Time   PHART 7.27 (L) 02/02/2022 0500   PCO2ART 44 02/02/2022 0500   PO2ART 125 (H) 02/02/2022 0500   HCO3 20.2 02/02/2022 0500   ACIDBASEDEF 6.6 (H) 02/02/2022 0500   O2SAT 99.5 02/02/2022 0500     Coagulation Profile: Recent Labs  Lab 02/11/2022 0028  INR 1.5*     Cardiac Enzymes: No results for input(s): "CKTOTAL", "CKMB", "CKMBINDEX", "TROPONINI" in the last 168 hours.  HbA1C: Hgb A1c MFr Bld  Date/Time Value Ref Range Status  09/05/2021 05:00 PM 6.1 (H) 4.8 - 5.6 % Final    Comment:    (NOTE)         Prediabetes: 5.7 - 6.4         Diabetes: >6.4         Glycemic control for adults with diabetes: <7.0   03/30/2021 08:25 PM 6.2 (H) 4.8 - 5.6 % Final    Comment:    (NOTE) Pre diabetes:          5.7%-6.4%  Diabetes:              >6.4%  Glycemic control for   <7.0% adults with diabetes     CBG: Recent Labs  Lab 02/01/22 1600 02/01/22 1920 02/01/22 2319 02/02/22 0327 02/02/22 0741  GLUCAP 124* 121* 138* 118* 106*      Past Medical History:  He,  has a past medical history of Acute on chronic combined systolic and diastolic CHF (congestive heart failure) ([name]), Acute on chronic systolic CHF (congestive heart failure) ([place]) (80/99/8338), Acute systolic congestive heart failure ([employer]), Atrial flutter, paroxysmal ([place]), CHF (congestive heart failure) ([place]) (03/30/2021), Chronic combined systolic and diastolic CHF (congestive heart failure) ([place]), Chronic kidney disease, stage III (moderate) ([place]), Dysrhythmia, Hypertension, Hypertensive crisis (01/20/2021), Hypertensive emergency (12/21/2020), NSTEMI (non-ST elevated myocardial infarction) ([place]) (12/21/2015), and Polysubstance abuse ([place]).   Surgical History:   Past Surgical History:  Procedure Laterality Date   KNEE SURGERY Right    LEFT  HEART CATH AND CORONARY ANGIOGRAPHY N/A 12/21/2020   Procedure: LEFT HEART CATH AND CORONARY ANGIOGRAPHY;  Surgeon: Martinique, Peter M, MD;  Location: [facility];  Service: Cardiovascular;  Laterality: N/A;   TEE WITHOUT CARDIOVERSION N/A 04/04/2019   Procedure: TRANSESOPHAGEAL ECHOCARDIOGRAM (TEE);  Surgeon: [name], MD;  Location: [facility];  Service: Cardiovascular;  Laterality: N/A;     Social History:   reports that he has been smoking cigarettes. He has a 21.00 pack-year smoking history. He has never used smokeless tobacco. He reports current alcohol use of about 6.0 standard drinks of alcohol per week. He reports current drug use. Drugs: Cocaine and Marijuana.   Family History:  His family history includes Heart disease in his paternal grandfather; Hypertension in his father.   Allergies No Known Allergies   Home Medications  Prior to Admission medications   Medication Sig Start Date End Date Taking? Authorizing Provider  amLODipine (NORVASC) 5 MG tablet Take 2 tablets (10 mg total) by mouth once daily. 11/05/21 02/03/22 Yes Enzo Bi, MD  atorvastatin (LIPITOR) 40 MG tablet Take 1 tablet (40 mg total) by mouth once daily. 11/05/21 02/03/22 Yes Enzo Bi,  MD  carvedilol (COREG) 12.5 MG tablet Take 1 tablet (12.5 mg total) by mouth 2 (two) times daily with a meal. 11/05/21 02/03/22 Yes Enzo Bi, MD  cyclobenzaprine (FLEXERIL) 5 MG tablet Take 5 mg by mouth 3 (three) times daily as needed. 01/03/22  Yes [provider]  dapagliflozin propanediol (FARXIGA) 10 MG TABS tablet Take 1 tablet (10 mg total) by mouth once daily before breakfast. 11/05/21  Yes Enzo Bi, MD  FEROSUL 325 (65 Fe) MG tablet Take 325 mg by mouth daily. 01/03/22  Yes [provider]  hydrALAZINE (APRESOLINE) 100 MG tablet Take 1 tablet (100 mg total) by mouth 3 (three) times daily. Patient taking differently: Take 50 mg by mouth 3 (three) times daily. 11/05/21 02/03/22 Yes Enzo Bi, MD  lidocaine  (LIDODERM) 5 % Place 1 patch onto the skin daily. Place 1 patch on the skin daily. Apply to affected area for 12 hours only each day (then remove patch) 01/03/22 02/02/22 Yes [provider]  losartan (COZAAR) 25 MG tablet Take 1 tablet (25 mg total) by mouth once daily. 11/05/21 02/03/22 Yes Enzo Bi, MD  nicotine (NICODERM CQ - DOSED IN MG/24 HOURS) 21 mg/24hr patch Place 1 patch onto the skin daily. 01/03/22  Yes [provider]  nicotine polacrilex (NICORETTE) 4 MG gum Place 1 each inside cheek every hour as needed. Chew 1 piece (4 mg total) and park in cheek every hour as needed for smoking cessation. 01/03/2022 02/02/2022 [place] 01/03/2022 01/03/22 02/02/22 Yes [provider]  spironolactone (ALDACTONE) 50 MG tablet Take 50 mg by mouth daily. 01/03/22  Yes [provider]  torsemide (DEMADEX) 20 MG tablet Take 1 tablet (20 mg total) by mouth once daily. 11/05/21 02/03/22 Yes Enzo Bi, MD  isosorbide mononitrate (IMDUR) 60 MG 24 hr tablet Take 2 tablets (120 mg total) by mouth once daily. 11/05/21 12/05/21  Enzo Bi, MD  potassium chloride (KLOR-CON) 10 MEQ tablet Take 1 tablet (10 mEq total) by mouth once daily. Patient not taking: Reported on 01/18/2022 11/05/21 02/03/22  Enzo Bi, MD  rivaroxaban (XARELTO) 20 MG TABS tablet Take 1 tablet (20 mg total) by mouth once daily with supper. 11/05/21 02/03/22  Enzo Bi, MD  potassium chloride (KLOR-CON M) 10 MEQ tablet Take 1 tablet (10 mEq total) by mouth daily. 09/09/21 11/05/21  Dorethea Clan, DO       DVT/GI PRX  assessed I Assessed the need for Labs I Assessed the need for Foley I Assessed the need for Central Venous Line Family Discussion when available I Assessed the need for Mobilization I made an Assessment of medications to be adjusted accordingly Safety Risk assessment completed

## 2022-02-02 NOTE — Progress Notes (Signed)
ANTICOAGULATION CONSULT NOTE   Pharmacy Consult for Heparin Infusion Indication: ACS/STEMI  Patient Measurements: Height: 6' (182.9 cm) Weight: 133.4 kg (294 lb 1.5 oz) IBW/kg (Calculated) : 77.6 Heparin Dosing Weight: 109.9 kg  Labs: Recent Labs    01/31/22 0802 01/31/22 1221 02/01/22 0321 02/01/22 0755 02/02/22 0317 02/02/22 1504  HGB 10.6*  --  10.3*  --  10.6*  --   HCT 35.3*  --  35.1*  --  35.2*  --   PLT 218  --  195  --  214  --   APTT  --    < >  --  81* 64* 65*  HEPARINUNFRC  --   --   --  0.55 0.45 0.48  CREATININE 3.13*  --  4.77*  --  5.14*  --    < > = values in this interval not displayed.     Estimated Creatinine Clearance: 26.7 mL/min (A) (by C-G formula based on SCr of 5.14 mg/dL (H)).   Medical History: Past Medical History:  Diagnosis Date   Acute on chronic combined systolic and diastolic CHF (congestive heart failure) ([name])    Acute on chronic systolic CHF (congestive heart failure) ([place]) 40/76/8088   Acute systolic congestive heart failure ([name])    Atrial flutter, paroxysmal ([name])    CHF (congestive heart failure) ([place]) 03/30/2021   Chronic combined systolic and diastolic CHF (congestive heart failure) ([name])    Chronic kidney disease, stage III (moderate) ([name])    Dysrhythmia    brief episode afib, cardioverted did not return   Hypertension    Hypertensive crisis 01/20/2021   Hypertensive emergency 12/21/2020   NSTEMI (non-ST elevated myocardial infarction) ([place]) 12/21/2015   Polysubstance abuse ([place])     Medications:  PTA meds include Xarelto 20 mg daily.  Last dose unknown.  Checking baseline HL, aPTT, & INR  Assessment: Pt is 41 yo male presenting to ED c/o SOB, found w/ elevated BNP and Troponin I lvls.  Goal of Therapy:  aPTT goal: 66-102 Heparin level 0.3-0.7 units/ml Monitor platelets by anticoagulation protocol: Yes  Date Time aPTT/HL Rate/Comment 6/16 1221 76/---  Therapeutic  x1 6/16 1803 64/---  Subtherapeutic 6/17 0126 75/--  Therapeutic x 1 6/17 0755 81/0.55 Therapeutic.  6/18 0317 64/0.45 aPTT subthera/HL thera 6/18 1504 65/0.48 HL Therapeutic   Plan:  Heparin therapeutic x 1  Continue heparin infusion at 2100 units/hr.  Recheck HL in 8 hrs to confirm CBC daily while on heparin.  Dorothe Pea, PharmD, BCPS Clinical Pharmacist   02/02/2022 3:44 PM

## 2022-02-02 NOTE — Plan of Care (Addendum)
Patient remains intubated, sedated, on vasopressors, on CRRT. Sedation lightened for neuro exam: patient follows commands, spontaneously moves all four extremities. No focal neuro deficits appreciated. ETT is noted to be at 26 cm at the lips instead of at 28 cm at the lips as was previously documented; Domingo Pulse, NP and RT was notified. BMP revealed K of 3.1; DR. Singh notified who updated CRRT orders to 4K bath from 2K. SCDs were applied to the patient's BLE by this RN. Patient tolerating target fluid removal goal at time of writing.    Problem: Education: Goal: Knowledge of General Education information will improve Description: Including pain rating scale, medication(s)/side effects and non-pharmacologic comfort measures 02/02/2022 2217 by Jesse Sans, RN Outcome: Progressing  Problem: Health Behavior/Discharge Planning: Goal: Ability to manage health-related needs will improve 02/02/2022 2217 by Jesse Sans, RN Outcome: Progressing    Problem: Clinical Measurements: Goal: Ability to maintain clinical measurements within normal limits will improve 02/02/2022 2217 by Jesse Sans, RN Outcome: Progressing  Goal: Will remain free from infection 02/02/2022 2217 by Jesse Sans, RN Outcome: Progressing  Goal: Diagnostic test results will improve 02/02/2022 2217 by Jesse Sans, RN Outcome: Progressing  Goal: Respiratory complications will improve 02/02/2022 2217 by Jesse Sans, RN Outcome: Progressing  Goal: Cardiovascular complication will be avoided 02/02/2022 2217 by Jesse Sans, RN Outcome: Progressing    Problem: Activity: Goal: Risk for activity intolerance will decrease 02/02/2022 2217 by Jesse Sans, RN Outcome: Progressing    Problem: Nutrition: Goal: Adequate nutrition will be maintained 02/02/2022 2217 by Jesse Sans, RN Outcome: Progressing    Problem: Coping: Goal: Level of anxiety will decrease 02/02/2022 2217 by Jesse Sans,  RN Outcome: Progressing    Problem: Elimination: Goal: Will not experience complications related to bowel motility 02/02/2022 2217 by Jesse Sans, RN Outcome: Progressing  Goal: Will not experience complications related to urinary retention 02/02/2022 2217 by Jesse Sans, RN Outcome: Progressing    Problem: Pain Managment: Goal: General experience of comfort will improve 02/02/2022 2217 by Jesse Sans, RN Outcome: Progressing    Problem: Safety: Goal: Ability to remain free from injury will improve 02/02/2022 2217 by Jesse Sans, RN Outcome: Progressing    Problem: Skin Integrity: Goal: Risk for impaired skin integrity will decrease 02/02/2022 2217 by Jesse Sans, RN Outcome: Progressing    Problem: Education: Goal: Ability to demonstrate management of disease process will improve 02/02/2022 2217 by Jesse Sans, RN Outcome: Progressing  Goal: Ability to verbalize understanding of medication therapies will improve 02/02/2022 2217 by Jesse Sans, RN Outcome: Progressing  Goal: Individualized Educational Video(s) 02/02/2022 2217 by Jesse Sans, RN Outcome: Progressing    Problem: Activity: Goal: Capacity to carry out activities will improve 02/02/2022 2217 by Jesse Sans, RN Outcome: Progressing    Problem: Cardiac: Goal: Ability to achieve and maintain adequate cardiopulmonary perfusion will improve 02/02/2022 2217 by Jesse Sans, RN Outcome: Progressing    Problem: Education: Goal: Ability to describe self-care measures that may prevent or decrease complications (Diabetes Survival Skills Education) will improve 02/02/2022 2217 by Jesse Sans, RN Outcome: Progressing  Goal: Individualized Educational Video(s) 02/02/2022 2217 by Jesse Sans, RN Outcome: Progressing   Problem: Coping: Goal: Ability to adjust to condition or change in health will improve 02/02/2022 2217 by Jesse Sans, RN Outcome: Progressing    Problem: Fluid Volume: Goal: Ability to maintain a balanced intake and output  will improve 02/02/2022 2217 by Jesse Sans, RN Outcome: Progressing  Problem: Health Behavior/Discharge Planning: Goal: Ability to identify and utilize available resources and services will improve 02/02/2022 2217 by Jesse Sans, RN Outcome: Progressing  Goal: Ability to manage health-related needs will improve 02/02/2022 2217 by Jesse Sans, RN Outcome: Progressing   Problem: Metabolic: Goal: Ability to maintain appropriate glucose levels will improve 02/02/2022 2217 by Jesse Sans, RN Outcome: Progressing  Problem: Nutritional: Goal: Maintenance of adequate nutrition will improve 02/02/2022 2217 by Jesse Sans, RN Outcome: Progressing  Goal: Progress toward achieving an optimal weight will improve 02/02/2022 2217 by Jesse Sans, RN Outcome: Progressing    Problem: Skin Integrity: Goal: Risk for impaired skin integrity will decrease 02/02/2022 2217 by Jesse Sans, RN Outcome: Progressing    Problem: Tissue Perfusion: Goal: Adequacy of tissue perfusion will improve 02/02/2022 2217 by Jesse Sans, RN Outcome: Progressing  Problem: Education: Goal: Knowledge of disease and its progression will improve 02/02/2022 2217 by Jesse Sans, RN Outcome: Progressing   Problem: Fluid Volume: Goal: Compliance with measures to maintain balanced fluid volume will improve 02/02/2022 2217 by Jesse Sans, RN Outcome: Progressing  Problem: Health Behavior/Discharge Planning: Goal: Ability to manage health-related needs will improve 02/02/2022 2217 by Jesse Sans, RN Outcome: Progressing  Problem: Nutritional: Goal: Ability to make healthy dietary choices will improve 02/02/2022 2217 by Jesse Sans, RN Outcome: Progressing  Problem: Clinical Measurements: Goal: Complications related to the disease process, condition or treatment will be avoided or  minimized 02/02/2022 2217 by Jesse Sans, RN Outcome: Progressing

## 2022-02-03 ENCOUNTER — Other Ambulatory Visit: Payer: Self-pay

## 2022-02-03 ENCOUNTER — Inpatient Hospital Stay: Payer: Medicaid Other

## 2022-02-03 DIAGNOSIS — I5023 Acute on chronic systolic (congestive) heart failure: Secondary | ICD-10-CM

## 2022-02-03 LAB — COMPREHENSIVE METABOLIC PANEL
ALT: 1665 U/L — ABNORMAL HIGH (ref 0–44)
AST: 905 U/L — ABNORMAL HIGH (ref 15–41)
Albumin: 2.6 g/dL — ABNORMAL LOW (ref 3.5–5.0)
Alkaline Phosphatase: 157 U/L — ABNORMAL HIGH (ref 38–126)
Anion gap: 7 (ref 5–15)
BUN: 34 mg/dL — ABNORMAL HIGH (ref 6–20)
CO2: 24 mmol/L (ref 22–32)
Calcium: 8.5 mg/dL — ABNORMAL LOW (ref 8.9–10.3)
Chloride: 104 mmol/L (ref 98–111)
Creatinine, Ser: 3.44 mg/dL — ABNORMAL HIGH (ref 0.61–1.24)
GFR, Estimated: 22 mL/min — ABNORMAL LOW (ref >60)
Glucose, Bld: 107 mg/dL — ABNORMAL HIGH (ref 70–99)
Potassium: 3.6 mmol/L (ref 3.5–5.1)
Sodium: 135 mmol/L (ref 135–145)
Total Bilirubin: 1.2 mg/dL (ref 0.3–1.2)
Total Protein: 6.9 g/dL (ref 6.5–8.1)

## 2022-02-03 LAB — CBC WITH DIFFERENTIAL/PLATELET
Abs Immature Granulocytes: 0.08 10*3/uL — ABNORMAL HIGH (ref 0.00–0.07)
Basophils Absolute: 0.1 10*3/uL (ref 0.0–0.1)
Basophils Relative: 1 %
Eosinophils Absolute: 0.3 10*3/uL (ref 0.0–0.5)
Eosinophils Relative: 5 %
HCT: 36.6 % — ABNORMAL LOW (ref 39.0–52.0)
Hemoglobin: 11.1 g/dL — ABNORMAL LOW (ref 13.0–17.0)
Immature Granulocytes: 1 %
Lymphocytes Relative: 13 %
Lymphs Abs: 0.9 10*3/uL (ref 0.7–4.0)
MCH: 20.3 pg — ABNORMAL LOW (ref 26.0–34.0)
MCHC: 30.3 g/dL (ref 30.0–36.0)
MCV: 66.8 fL — ABNORMAL LOW (ref 80.0–100.0)
Monocytes Absolute: 0.5 10*3/uL (ref 0.1–1.0)
Monocytes Relative: 8 %
Neutro Abs: 4.8 10*3/uL (ref 1.7–7.7)
Neutrophils Relative %: 72 %
Platelets: 188 10*3/uL (ref 150–400)
RBC: 5.48 MIL/uL (ref 4.22–5.81)
RDW: 18.5 % — ABNORMAL HIGH (ref 11.5–15.5)
Smear Review: NORMAL
WBC: 6.7 10*3/uL (ref 4.0–10.5)
nRBC: 3.3 % — ABNORMAL HIGH (ref 0.0–0.2)

## 2022-02-03 LAB — GLUCOSE, CAPILLARY
Glucose-Capillary: 118 mg/dL — ABNORMAL HIGH (ref 70–99)
Glucose-Capillary: 79 mg/dL (ref 70–99)
Glucose-Capillary: 126 mg/dL — ABNORMAL HIGH (ref 70–99)
Glucose-Capillary: 122 mg/dL — ABNORMAL HIGH (ref 70–99)
Glucose-Capillary: 121 mg/dL — ABNORMAL HIGH (ref 70–99)

## 2022-02-03 LAB — RENAL FUNCTION PANEL
Albumin: 2.5 g/dL — ABNORMAL LOW (ref 3.5–5.0)
Anion gap: 9 (ref 5–15)
BUN: 37 mg/dL — ABNORMAL HIGH (ref 6–20)
CO2: 23 mmol/L (ref 22–32)
Calcium: 8.8 mg/dL — ABNORMAL LOW (ref 8.9–10.3)
Chloride: 103 mmol/L (ref 98–111)
Creatinine, Ser: 3.36 mg/dL — ABNORMAL HIGH (ref 0.61–1.24)
GFR, Estimated: 23 mL/min — ABNORMAL LOW (ref >60)
Glucose, Bld: 124 mg/dL — ABNORMAL HIGH (ref 70–99)
Phosphorus: 4.5 mg/dL (ref 2.5–4.6)
Potassium: 3.8 mmol/L (ref 3.5–5.1)
Sodium: 135 mmol/L (ref 135–145)

## 2022-02-03 LAB — LEGIONELLA PNEUMOPHILA SEROGP 1 UR AG: L. pneumophila Serogp 1 Ur Ag: NEGATIVE

## 2022-02-03 LAB — PATHOLOGIST SMEAR REVIEW

## 2022-02-03 LAB — HEPARIN LEVEL (UNFRACTIONATED): Heparin Unfractionated: 0.46 IU/mL (ref 0.30–0.70)

## 2022-02-03 LAB — PHOSPHORUS: Phosphorus: 3.8 mg/dL (ref 2.5–4.6)

## 2022-02-03 LAB — MAGNESIUM: Magnesium: 2.2 mg/dL (ref 1.7–2.4)

## 2022-02-03 MED ORDER — STERILE WATER FOR INJECTION IJ SOLN
INTRAMUSCULAR | Status: AC
  Administered 2022-02-03: 10 mL
  Filled 2022-02-03: qty 10

## 2022-02-03 MED ORDER — ALTEPLASE 2 MG IJ SOLR
2.0000 mg | Freq: Once | INTRAMUSCULAR | Status: DC
  Filled 2022-02-03: qty 2

## 2022-02-03 MED ORDER — STERILE WATER FOR INJECTION IJ SOLN
INTRAMUSCULAR | Status: AC
  Filled 2022-02-03: qty 10

## 2022-02-03 MED ORDER — RENA-VITE PO TABS
1.0000 | ORAL_TABLET | Freq: Every day | ORAL | Status: DC
  Administered 2022-02-03 – 2022-02-13 (×11): 1
  Filled 2022-02-03 (×11): qty 1

## 2022-02-03 MED ORDER — VECURONIUM BROMIDE 10 MG IV SOLR
INTRAVENOUS | Status: AC
  Filled 2022-02-03: qty 10

## 2022-02-03 MED ORDER — ROCURONIUM BROMIDE 10 MG/ML (PF) SYRINGE
PREFILLED_SYRINGE | INTRAVENOUS | Status: AC
  Filled 2022-02-03: qty 10

## 2022-02-03 MED ORDER — MIDAZOLAM HCL 2 MG/2ML IJ SOLN
INTRAMUSCULAR | Status: AC
  Filled 2022-02-03: qty 2

## 2022-02-03 MED ORDER — ETOMIDATE 2 MG/ML IV SOLN
INTRAVENOUS | Status: AC
  Filled 2022-02-03: qty 20

## 2022-02-03 MED ORDER — ALTEPLASE 2 MG IJ SOLR
2.0000 mg | Freq: Once | INTRAMUSCULAR | Status: AC
  Administered 2022-02-03: 2 mg

## 2022-02-03 MED ORDER — DOCUSATE SODIUM 50 MG/5ML PO LIQD
100.0000 mg | Freq: Two times a day (BID) | ORAL | Status: DC
  Administered 2022-02-03 – 2022-02-14 (×20): 100 mg
  Filled 2022-02-03 (×21): qty 10

## 2022-02-03 MED ORDER — FENTANYL CITRATE (PF) 100 MCG/2ML IJ SOLN
INTRAMUSCULAR | Status: AC
  Filled 2022-02-03: qty 2

## 2022-02-03 MED ORDER — IPRATROPIUM-ALBUTEROL 0.5-2.5 (3) MG/3ML IN SOLN
3.0000 mL | RESPIRATORY_TRACT | Status: DC
  Administered 2022-02-03 – 2022-02-08 (×27): 3 mL via RESPIRATORY_TRACT
  Filled 2022-02-03 (×26): qty 3

## 2022-02-03 MED ORDER — ALTEPLASE 2 MG IJ SOLR
INTRAMUSCULAR | Status: AC
  Filled 2022-02-03: qty 4

## 2022-02-03 MED ORDER — POLYETHYLENE GLYCOL 3350 17 G PO PACK
17.0000 g | PACK | Freq: Every day | ORAL | Status: DC
  Administered 2022-02-03 – 2022-02-14 (×11): 17 g
  Filled 2022-02-03 (×11): qty 1

## 2022-02-03 MED ORDER — BUDESONIDE 0.5 MG/2ML IN SUSP
0.5000 mg | Freq: Two times a day (BID) | RESPIRATORY_TRACT | Status: DC
  Administered 2022-02-03 – 2022-02-08 (×10): 0.5 mg via RESPIRATORY_TRACT
  Filled 2022-02-03 (×10): qty 2

## 2022-02-03 NOTE — Plan of Care (Signed)
  Problem: Education: Goal: Knowledge of General Education information will improve Description: Including pain rating scale, medication(s)/side effects and non-pharmacologic comfort measures Outcome: Not Progressing   Problem: Health Behavior/Discharge Planning: Goal: Ability to manage health-related needs will improve Outcome: Not Progressing   Problem: Clinical Measurements: Goal: Ability to maintain clinical measurements within normal limits will improve Outcome: Not Progressing Goal: Will remain free from infection Outcome: Not Progressing Goal: Diagnostic test results will improve Outcome: Not Progressing Goal: Respiratory complications will improve Outcome: Not Progressing Goal: Cardiovascular complication will be avoided Outcome: Not Progressing   Problem: Activity: Goal: Risk for activity intolerance will decrease Outcome: Not Progressing   Problem: Nutrition: Goal: Adequate nutrition will be maintained Outcome: Not Progressing   Problem: Coping: Goal: Level of anxiety will decrease Outcome: Not Progressing   Problem: Elimination: Goal: Will not experience complications related to bowel motility Outcome: Not Progressing Goal: Will not experience complications related to urinary retention Outcome: Not Progressing   Problem: Pain Managment: Goal: General experience of comfort will improve Outcome: Not Progressing   Problem: Safety: Goal: Ability to remain free from injury will improve Outcome: Not Progressing   Problem: Skin Integrity: Goal: Risk for impaired skin integrity will decrease Outcome: Not Progressing   Problem: Education: Goal: Ability to demonstrate management of disease process will improve Outcome: Not Progressing Goal: Ability to verbalize understanding of medication therapies will improve Outcome: Not Progressing Goal: Individualized Educational Video(s) Outcome: Not Progressing   Problem: Activity: Goal: Capacity to carry out  activities will improve Outcome: Not Progressing   Problem: Cardiac: Goal: Ability to achieve and maintain adequate cardiopulmonary perfusion will improve Outcome: Not Progressing   Problem: Education: Goal: Ability to describe self-care measures that may prevent or decrease complications (Diabetes Survival Skills Education) will improve Outcome: Not Progressing Goal: Individualized Educational Video(s) Outcome: Not Progressing   Problem: Coping: Goal: Ability to adjust to condition or change in health will improve Outcome: Not Progressing   Problem: Fluid Volume: Goal: Ability to maintain a balanced intake and output will improve Outcome: Not Progressing   Problem: Health Behavior/Discharge Planning: Goal: Ability to identify and utilize available resources and services will improve Outcome: Not Progressing Goal: Ability to manage health-related needs will improve Outcome: Not Progressing   Problem: Metabolic: Goal: Ability to maintain appropriate glucose levels will improve Outcome: Not Progressing   Problem: Nutritional: Goal: Maintenance of adequate nutrition will improve Outcome: Not Progressing Goal: Progress toward achieving an optimal weight will improve Outcome: Not Progressing   Problem: Skin Integrity: Goal: Risk for impaired skin integrity will decrease Outcome: Not Progressing   Problem: Tissue Perfusion: Goal: Adequacy of tissue perfusion will improve Outcome: Not Progressing   Problem: Education: Goal: Knowledge of disease and its progression will improve Outcome: Not Progressing Goal: Individualized Educational Video(s) Outcome: Not Progressing   Problem: Fluid Volume: Goal: Compliance with measures to maintain balanced fluid volume will improve Outcome: Not Progressing   Problem: Health Behavior/Discharge Planning: Goal: Ability to manage health-related needs will improve Outcome: Not Progressing   Problem: Nutritional: Goal: Ability to  make healthy dietary choices will improve Outcome: Not Progressing   Problem: Clinical Measurements: Goal: Complications related to the disease process, condition or treatment will be avoided or minimized Outcome: Not Progressing

## 2022-02-03 NOTE — Progress Notes (Signed)
Nutrition Follow Up Note   DOCUMENTATION CODES:   Obesity unspecified  INTERVENTION:   Vital HP _0 /hr continuous + Pro-Source 58m TID via tube  Propofol: 19.73 ml/hr- provides 521kcal/day   Free water flushes 338mq4 hours to maintain tube patency   Regimen provides 2081kcal/day, 159g/day protein and 138452may of water   Add rena-vit daily via tube   NUTRITION DIAGNOSIS:   Inadequate oral intake related to inability to eat (pt sedated and ventilated) as evidenced by NPO status.  GOAL:   Provide needs based on ASPEN/SCCM guidelines -met   MONITOR:   Vent status, Labs, Weight trends, TF tolerance, Skin, I & O's  ASSESSMENT:   [age]o male with h/o Afib, CKD, HTN, substance abuse, CAD, NSTEMI and CHF who is admitted with PNA, sepsis, CHF and PEA arrest.  Pt sedated and ventilated. OGT in place. Pt tolerating tube feeds at goal rate. No BM noted since 6/15; MD aware and bowel regimen initiated. Per chart, pt up ~5lbs since admission and appears up ~18lbs from his UBW. Pt +1.6L on his I & Os. Pt started on CRRT 6/17.   Medications reviewed and include: colace, insulin, protonix, miralax, heparin, phenylephrine, propofol   Labs reviewed: K 3.6 wnl, BUN 34(H), creat 3.44(H), P 3.8 wnl, Mg 2.2 wnl Wbc- 12.7(H), Hgb 10.6(L), Hct 35.3(L) Cbgs- 126, 79, 118 x 24 hrs  Patient is currently intubated on ventilator support MV: 10.5 L/min Temp (24hrs), Avg:99.3 F (37.4 C), Min:97.9 F (36.6 C), Max:99.9 F (37.7 C)  Propofol: 19.73 ml/hr- provides 521kcal/day   MAP- >12m32m  UOP- 117kcal/day   Diet Order:   Diet Order     None      EDUCATION NEEDS:   No education needs have been identified at this time  Skin:  Skin Assessment: Reviewed RN Assessment  Last BM:  6/15  Height:   Ht Readings from Last 1 Encounters:  02/01/2022 6' (1.829 m)    Weight:   Wt Readings from Last 1 Encounters:  02/03/22 (!) 142 kg    Ideal Body Weight:  80.9 kg  BMI:   Body mass index is 42.46 kg/m.  Estimated Nutritional Needs:   Kcal:  1446-1841kcal/day  Protein:  >160g/day  Fluid:  2.5-2.8L/day  CaseKoleen Distance RD, LDN Please refer to [facility] RD and/or RD on-call/weekend/after hours pager

## 2022-02-03 NOTE — Progress Notes (Addendum)
[place], [place] 02/03/22  Subjective:   Hospital day # 4  Patient remains critically ill. Neuro: Remains sedated with propofol and fentanyl  cvs: Requiring phenylephrine, attempting to wean.  Atrial fibrillation pulm: Ventilator assisted.  FiO2 40% with 10 of PEEP.  Failed attempts to wean PEEP. gi: Tube feeds - 60 cc/h. Renal: Foley in place -159ml  CRRT in place Net UF 112ml/hr  06/18 0701 - 06/19 0700 In: 3616.6 [I.V.:1321.7; NG/GT:1640; IV Piggyback:650] Out: 4268 [Urine:117] Lab Results  Component Value Date   CREATININE 3.44 (H) 02/03/2022   CREATININE 3.55 (H) 02/02/2022   CREATININE 5.14 (H) 02/02/2022     Objective:  Vital signs in last 24 hours:  Temp:  [97.9 F (36.6 C)-99.9 F (37.7 C)] 99.7 F (37.6 C) (06/19 1015) Pulse Rate:  [25-209] 31 (06/19 1015) Resp:  [16-23] 20 (06/19 1015) SpO2:  [98 %-100 %] 100 % (06/19 1015) Arterial Line BP: (95-129)/(59-80) 105/65 (06/19 1015) FiO2 (%):  [40 %-50 %] 40 % (06/19 0814) Weight:  [142 kg] 142 kg (06/19 0422)  Weight change: 8.6 kg Filed Weights   02/01/22 0337 02/02/22 0328 02/03/22 0422  Weight: 132.6 kg 133.4 kg (!) 142 kg    Intake/Output:    Intake/Output Summary (Last 24 hours) at 02/03/2022 1114 Last data filed at 02/03/2022 1100 Gross per 24 hour  Intake 3557.76 ml  Output 5837 ml  Net -2279.24 ml      Physical Exam: General: Critically ill-appearing, laying in the bed  HEENT ET tube, OG tube in place  Pulm/lungs Ventilator assisted  CVS/Heart Atrial fibrillation, irregular  Abdomen:  Soft  Extremities: Some dependent edema present  Neurologic: Sedated  Skin: Warm, dry  Access: Right femoral dialysis catheter in place       Basic Metabolic Panel:  Recent Labs  Lab 01/26/2022 0638 01/31/22 0505 01/31/22 0802 02/01/22 0321 02/02/22 0317 02/02/22 2012 02/03/22 0412  NA 139 137 136 136 135 135 135  K 3.6 3.5 4.1 4.4 3.7 3.1* 3.6  CL 110 106 105  104 104 104 104  CO2 20* 21* 22 20* 19* 23 24  GLUCOSE 132* 133* 131* 97 123* 108* 107*  BUN 35* 31* 33* 45* 56* 37* 34*  CREATININE 2.22* 2.44* 3.13* 4.77* 5.14* 3.55* 3.44*  CALCIUM 8.8* 8.8* 9.0 8.6* 8.4* 9.1 8.5*  MG 2.1 2.1  --  2.2 2.1  --  2.2  PHOS  --   --   --   --  6.8* 4.2 3.8      CBC: Recent Labs  Lab 02/06/2022 0638 01/31/22 0505 01/31/22 0802 02/01/22 0321 02/02/22 0317 02/03/22 0412  WBC 9.7 14.1* 12.7* 11.8* 9.8 6.7  NEUTROABS 7.6  --  10.3* 8.8* 6.9 4.8  HGB 11.3* 11.9* 10.6* 10.3* 10.6* 11.1*  HCT 37.2* 39.9 35.3* 35.1* 35.2* 36.6*  MCV 67.1* 68.7* 68.7* 69.8* 68.8* 66.8*  PLT 229 213 218 195 214 188       Lab Results  Component Value Date   HEPBSAG NON REACTIVE 02/02/2022   HEPBIGM NON REACTIVE 02/02/2022      Microbiology:  Recent Results (from the past 240 hour(s))  Blood culture (routine x 2)     Status: None (Preliminary result)   Collection Time: 01/29/2022  1:17 AM   Specimen: BLOOD  Result Value Ref Range Status   Specimen Description BLOOD LEFT HAND  Final   Special Requests   Final    BOTTLES DRAWN AEROBIC AND ANAEROBIC Blood  Culture adequate volume   Culture   Final    NO GROWTH 3 DAYS Performed at [facility], [address] [place], [place] [address]    Report Status PENDING  Incomplete  Resp Panel by RT-PCR (Flu A&B, Covid) Anterior Nasal Swab     Status: None   Collection Time: 02/11/2022  1:17 AM   Specimen: Anterior Nasal Swab  Result Value Ref Range Status   SARS Coronavirus 2 by RT PCR NEGATIVE NEGATIVE Final    Comment: (NOTE) SARS-CoV-2 target nucleic acids are NOT DETECTED.  The SARS-CoV-2 RNA is generally detectable in upper respiratory specimens during the acute phase of infection. The lowest concentration of SARS-CoV-2 viral copies this assay can detect is 138 copies/mL. A negative result does not preclude SARS-Cov-2 infection and should not be used as the sole basis for treatment or other patient  management decisions. A negative result may occur with  improper specimen collection/handling, submission of specimen other than nasopharyngeal swab, presence of viral mutation(s) within the areas targeted by this assay, and inadequate number of viral copies(<138 copies/mL). A negative result must be combined with clinical observations, patient history, and epidemiological information. The expected result is Negative.  Fact Sheet for Patients:  EntrepreneurPulse.com.au  Fact Sheet for Healthcare Providers:  IncredibleEmployment.be  This test is no t yet approved or cleared by the [place] FDA and  has been authorized for detection and/or diagnosis of SARS-CoV-2 by FDA under an Emergency Use Authorization (EUA). This EUA will remain  in effect (meaning this test can be used) for the duration of the COVID-19 declaration under Section 564(b)(1) of the Act, 21 U.S.C.section 360bbb-3(b)(1), unless the authorization is terminated  or revoked sooner.       Influenza A by PCR NEGATIVE NEGATIVE Final   Influenza B by PCR NEGATIVE NEGATIVE Final    Comment: (NOTE) The Xpert Xpress SARS-CoV-2/FLU/RSV plus assay is intended as an aid in the diagnosis of influenza from Nasopharyngeal swab specimens and should not be used as a sole basis for treatment. Nasal washings and aspirates are unacceptable for Xpert Xpress SARS-CoV-2/FLU/RSV testing.  Fact Sheet for Patients: EntrepreneurPulse.com.au  Fact Sheet for Healthcare Providers: IncredibleEmployment.be  This test is not yet approved or cleared by the [place] FDA and has been authorized for detection and/or diagnosis of SARS-CoV-2 by FDA under an Emergency Use Authorization (EUA). This EUA will remain in effect (meaning this test can be used) for the duration of the COVID-19 declaration under Section 564(b)(1) of the Act, 21 U.S.C. section 360bbb-3(b)(1),  unless the authorization is terminated or revoked.  Performed at [facility], [address] [place], [place] [address]   Blood culture (routine x 2)     Status: None (Preliminary result)   Collection Time: 01/17/2022  1:18 AM   Specimen: BLOOD  Result Value Ref Range Status   Specimen Description BLOOD LEFT ASSIST CONTROL  Final   Special Requests   Final    BOTTLES DRAWN AEROBIC AND ANAEROBIC Blood Culture adequate volume   Culture   Final    NO GROWTH 3 DAYS Performed at [facility], [address], [place], [place] [address]    Report Status PENDING  Incomplete  MRSA Next Gen by PCR, Nasal     Status: None   Collection Time: 01/31/2022  3:01 AM   Specimen: Nasal Mucosa; Nasal Swab  Result Value Ref Range Status   MRSA by PCR Next Gen NOT DETECTED NOT DETECTED Final  Comment: (NOTE) The GeneXpert MRSA Assay (FDA approved for NASAL specimens only), is one component of a comprehensive MRSA colonization surveillance program. It is not intended to diagnose MRSA infection nor to guide or monitor treatment for MRSA infections. Test performance is not FDA approved in patients less than 110 years old. Performed at [facility], [address] [place], [place] [address]   Expectorated Sputum Assessment w Gram Stain, Rflx to Resp Cult     Status: None   Collection Time: 01/17/2022  6:07 AM   Specimen: Expectorated Sputum  Result Value Ref Range Status   Specimen Description EXPECTORATED SPUTUM  Final   Special Requests NONE  Final   Sputum evaluation   Final    THIS SPECIMEN IS ACCEPTABLE FOR SPUTUM CULTURE Performed at [facility], [address], [place], [place] [address]    Report Status 02/13/2022 FINAL  Final  Culture, Respiratory w Gram Stain     Status: None   Collection Time: 02/08/2022  6:07 AM  Result Value Ref Range Status   Specimen Description   Final    EXPECTORATED SPUTUM Performed at [facility], [address] [place], [place] [address]    Special Requests   Final    NONE Reflexed from [id number] Performed at [facility], [address] [place], [place] [address]    Gram Stain   Final    RARE WBC PRESENT, PREDOMINANTLY PMN RARE GRAM POSITIVE COCCI IN PAIRS    Culture   Final    FEW Normal respiratory flora-no Staph aureus or Pseudomonas seen Performed at [facility], [place] [address] [place],  [address]    Report Status 02/01/2022 FINAL  Final    Coagulation Studies: No results for input(s): "LABPROT", "INR" in the last 72 hours.   Urinalysis: No results for input(s): "COLORURINE", "LABSPEC", "PHURINE", "GLUCOSEU", "HGBUR", "BILIRUBINUR", "KETONESUR", "PROTEINUR", "UROBILINOGEN", "NITRITE", "LEUKOCYTESUR" in the last 72 hours.  Invalid input(s): "APPERANCEUR"     Imaging: DG Chest Port 1 View  Result Date: 02/03/2022 CLINICAL DATA:  Endotracheal tube. EXAM: PORTABLE CHEST 1 VIEW COMPARISON:  February 02, 2022. FINDINGS: Stable cardiomegaly. Endotracheal and nasogastric tubes are unchanged in position. Stable right lung opacity is noted with probable left retrocardiac opacity is well. Bony thorax is unremarkable. IMPRESSION: Stable support apparatus. Stable bilateral lung opacities are noted as described above. Electronically Signed   By: Marijo Conception M.D.   On: 02/03/2022 08:02   US Abdomen Limited RUQ (LIVER/GB)  Result Date: 02/02/2022 CLINICAL DATA:  Elevated liver enzymes. EXAM: ULTRASOUND ABDOMEN LIMITED RIGHT UPPER QUADRANT COMPARISON:  None Available. FINDINGS: Gallbladder: No gallstones or wall thickening visualized. No sonographic Murphy sign noted by sonographer. Common bile duct: Diameter: 4 mm Liver: There is diffuse increased liver echogenicity most commonly seen in the setting of fatty infiltration. Superimposed inflammation or fibrosis is not excluded. Clinical correlation is recommended. Portal vein is patent on color Doppler imaging with normal  direction of blood flow towards the liver. Other: None. IMPRESSION: Fatty liver, otherwise unremarkable right upper quadrant ultrasound. Electronically Signed   By: Anner Crete M.D.   On: 02/02/2022 20:51   DG Chest Port 1 View  Result Date: 02/02/2022 CLINICAL DATA:  Acute respiratory failure with hypoxia. EXAM: PORTABLE CHEST 1 VIEW COMPARISON:  02/01/2022 FINDINGS: Enteric tube courses into the region of the stomach and off the film as tip is not visualized. Endotracheal tube is unchanged. Lungs are hypoinflated with stable mild hazy opacification  in the left base/retrocardiac region. Mild hazy density over the right lung unchanged. Stable cardiomegaly. Remainder of the exam is unchanged. IMPRESSION: 1. Hypoinflation with stable hazy density over the right lung and left base/retrocardiac region. 2. Stable cardiomegaly. 3. Tubes and lines as described. Electronically Signed   By: Marin Olp M.D.   On: 02/02/2022 08:39     Medications:     prismasol BGK 4/2.5 500 mL/hr at 02/03/22 0709    prismasol BGK 4/2.5 500 mL/hr at 02/03/22 0710   sodium chloride Stopped (02/02/22 2010)   ceFEPime (MAXIPIME) IV Stopped (02/03/22 1032)   feeding supplement (VITAL HIGH PROTEIN) 60 mL/hr at 02/03/22 1100   fentaNYL infusion INTRAVENOUS 100 mcg/hr (02/03/22 1100)   heparin 2,100 Units/hr (02/03/22 1100)   metronidazole Stopped (02/03/22 0959)   phenylephrine (NEO-SYNEPHRINE) Adult infusion 20 mcg/min (02/03/22 1100)   prismasol BGK 4/2.5 2,500 mL/hr at 02/03/22 1059   propofol (DIPRIVAN) infusion 25 mcg/kg/min (02/03/22 1100)    Chlorhexidine Gluconate Cloth  6 each Topical Q0600   docusate  100 mg Per Tube BID   feeding supplement (PROSource TF)  45 mL Per Tube TID   free water  30 mL Per Tube Q4H   insulin aspart  0-6 Units Subcutaneous Q4H   lidocaine  1 patch Transdermal Q24H   mouth rinse  15 mL Mouth Rinse Q2H   pantoprazole sodium  40 mg Per Tube Daily   polyethylene glycol  17 g Per  Tube Daily   sodium chloride, acetaminophen, guaiFENesin-dextromethorphan, heparin, morphine injection, mouth rinse, mouth rinse, sodium chloride  Assessment/ Plan:  41 y.o. male with   chronic systolic and diastolic CHF, hypertension, anemia, polysubstance abuse (cocaine and tobacco), chronic kidney disease, non-STEMI May 2023, A-fib with RVR admitted on 01/18/2022 for Acute respiratory failure ([name]) [J96.00] Acute respiratory failure with hypoxia ([name]) [J96.01] Atrial fibrillation with RVR ([place]) [I48.91] Community acquired pneumonia of right lung, unspecified part of lung [J18.9] Acute on chronic congestive heart failure, unspecified heart failure type ([place]) [I50.9] Acute respiratory failure with hypoxemia ([name]) [J96.01]  Acute kidney injury on chronic kidney disease stage IIIb. Baseline creatinine 2.03/GFR 42 on November 04, 2021. Renal ultrasound without obstruction.  No IV contrast exposure.AKI likely secondary to cardiorenal syndrome.  Patient was started on CRRT the evening of February 01, 2022.  Tolerating well. Continue UF rate 100 cc/h. Will intermittent hemodialysis once pressors are weaned.  Acute respiratory failure Currently ventilator assisted.   Getting IV cefepime, linezolid, metronidazole for community-acquired pneumonia  Acute exacerbation of chronic systolic and diastolic CHF 2D echo September 05, 2021-severe global reduction in LV function, severe LVH, EF 20 to 25%, global hypokinesis, grade 3 diastolic dysfunction     LOS: 4 Colon Flattery 6/19/202311:14 AM  [facility] [place], Portal  Note: This note was prepared with Dragon dictation. Any transcription errors are unintentional

## 2022-02-03 NOTE — Progress Notes (Addendum)
PHARMACY CONSULT NOTE - FOLLOW UP  Pharmacy Consult for Electrolyte Monitoring and Replacement   Recent Labs: Potassium (mmol/L)  Date Value  02/03/2022 3.6   Magnesium (mg/dL)  Date Value  02/03/2022 2.2   Calcium (mg/dL)  Date Value  02/03/2022 8.5 (L)   Albumin (g/dL)  Date Value  02/03/2022 2.6 (L)   Phosphorus (mg/dL)  Date Value  02/03/2022 3.8   Sodium (mmol/L)  Date Value  02/03/2022 135     Assessment: 41 year old male presenting with shortness of breath. 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 Past medical history includes atrial fibrillation on Xarelto PTA, chronic combined systolic and diastolic CHF, hypertension, polysubstance abuse, CKD stage 3. Intubated and sedated on fentanyl and propofol. Pressor support with phenylephrine. Admission complicated by multiorgan failure with worsening AKI ultimately requiring CRRT, which was initiated 6/17.  Goal of Therapy:  K > 4 Mag > 2 All other electrolytes within normal limits  Plan:  Defer to nephrology recommendations given CRRT  Wynelle Cleveland, PharmD Pharmacy Resident  02/03/2022 8:47 AM

## 2022-02-03 NOTE — Progress Notes (Signed)
ANTICOAGULATION CONSULT NOTE   Pharmacy Consult for Heparin Infusion Indication: ACS/STEMI  Patient Measurements: Height: 6' (182.9 cm) Weight: (!) 142 kg (313 lb 0.9 oz) IBW/kg (Calculated) : 77.6 Heparin Dosing Weight: 109.9 kg  Labs: Recent Labs    02/01/22 0126 02/01/22 0321 02/01/22 0755 02/02/22 0317 02/02/22 1504 02/02/22 2012 02/02/22 2306 02/03/22 0412  HGB  --  10.3*  --  10.6*  --   --   --  11.1*  HCT  --  35.1*  --  35.2*  --   --   --  36.6*  PLT  --  195  --  214  --   --   --  188  APTT  --   --  81* 64* 65*  --   --   --   HEPARINUNFRC   < >  --  0.55 0.45 0.48  --  0.48 0.46  CREATININE  --  4.77*  --  5.14*  --  3.55*  --  3.44*   < > = values in this interval not displayed.     Estimated Creatinine Clearance: 41.3 mL/min (A) (by C-G formula based on SCr of 3.44 mg/dL (H)).   Medical History: Past Medical History:  Diagnosis Date   Acute on chronic combined systolic and diastolic CHF (congestive heart failure) ([name])    Acute on chronic systolic CHF (congestive heart failure) ([place]) 93/79/0240   Acute systolic congestive heart failure ([name])    Atrial flutter, paroxysmal ([name])    CHF (congestive heart failure) ([place]) 03/30/2021   Chronic combined systolic and diastolic CHF (congestive heart failure) ([name])    Chronic kidney disease, stage III (moderate) ([name])    Dysrhythmia    brief episode afib, cardioverted did not return   Hypertension    Hypertensive crisis 01/20/2021   Hypertensive emergency 12/21/2020   NSTEMI (non-ST elevated myocardial infarction) ([place]) 12/21/2015   Polysubstance abuse ([place])     Medications:  PTA meds include Xarelto 20 mg daily.  Last dose unknown.  Checking baseline HL, aPTT, & INR  Assessment: Pt is 41 yo male presenting to ED c/o SOB, found w/ elevated BNP and Troponin I lvls.  Goal of Therapy:  aPTT goal: 66-102 Heparin level 0.3-0.7 units/ml Monitor platelets by anticoagulation protocol:  Yes  Date Time aPTT/HL Rate/Comment 6/16 1221 76/---  Therapeutic x1 6/16 1803 64/---  Subtherapeutic 6/17 0126 75/--  Therapeutic x 1 6/17 0755 81/0.55 Therapeutic.  6/18 0317 64/0.45 aPTT subthera/HL thera 6/18 1504 65/0.48 HL Therapeutic  6/18 2306 0.48  Therapeutic x 2 6/19 0412 0.46  Therapeutic x 3  Plan:  Continue heparin infusion at 2100 units/hr.  Recheck HL daily w/ AM labs while therapeutic CBC daily while on heparin.  Renda Rolls, PharmD, [facility] 02/03/2022 5:31 AM

## 2022-02-03 NOTE — Progress Notes (Signed)
Progress Note  Patient Name: Gerald [name] Date of Encounter: 02/03/2022  [facility] Cardiologist: Ida Rogue, MD   Subjective   Remains intubated, sedated. Started on dialysis over the weekend.  LFTs and renal function improving.  Statin and amiodarone being held. No acute overnight events.   Inpatient Medications    Scheduled Meds:  Chlorhexidine Gluconate Cloth  6 each Topical Q0600   docusate  100 mg Per Tube BID   feeding supplement (PROSource TF)  45 mL Per Tube TID   free water  30 mL Per Tube Q4H   insulin aspart  0-6 Units Subcutaneous Q4H   lidocaine  1 patch Transdermal Q24H   mouth rinse  15 mL Mouth Rinse Q2H   pantoprazole sodium  40 mg Per Tube Daily   polyethylene glycol  17 g Per Tube Daily   Continuous Infusions:   prismasol BGK 4/2.5 500 mL/hr at 02/03/22 0709    prismasol BGK 4/2.5 500 mL/hr at 02/03/22 0710   sodium chloride Stopped (02/02/22 2010)   ceFEPime (MAXIPIME) IV Stopped (02/02/22 2143)   feeding supplement (VITAL HIGH PROTEIN) 60 mL/hr at 02/03/22 0700   fentaNYL infusion INTRAVENOUS 100 mcg/hr (02/03/22 0700)   heparin 2,100 Units/hr (02/03/22 0700)   metronidazole Stopped (02/02/22 2110)   phenylephrine (NEO-SYNEPHRINE) Adult infusion 30 mcg/min (02/03/22 0700)   prismasol BGK 4/2.5 2,500 mL/hr at 02/03/22 0708   propofol (DIPRIVAN) infusion 25 mcg/kg/min (02/03/22 0700)   PRN Meds: sodium chloride, acetaminophen, guaiFENesin-dextromethorphan, heparin, morphine injection, mouth rinse, mouth rinse, sodium chloride   Vital Signs    Vitals:   02/03/22 0621 02/03/22 0630 02/03/22 0645 02/03/22 0700  BP:      Pulse: 67 69 73 83  Resp: 20 18 20 20   Temp: 99.9 F (37.7 C) 99.9 F (37.7 C) 99.9 F (37.7 C) 99.9 F (37.7 C)  TempSrc:      SpO2: 100% 100% 100% 100%  Weight:      Height:        Intake/Output Summary (Last 24 hours) at 02/03/2022 0732 Last data filed at 02/03/2022 0700 Gross per 24 hour  Intake 3616.62 ml   Output 6394 ml  Net -2777.38 ml       02/03/2022    4:22 AM 02/02/2022    3:28 AM 02/01/2022    3:37 AM  Last 3 Weights  Weight (lbs) 313 lb 0.9 oz 294 lb 1.5 oz 292 lb 5.3 oz  Weight (kg) 142 kg 133.4 kg 132.6 kg      Telemetry    Atrial fibrillation, ventricular rates in the 60s to 80s bpm - Personally Reviewed  ECG     No new tracings - Personally Reviewed  Physical Exam   GEN: Intubated, sedated Neck: Unable to assess for JVD Cardiac: Irregular irregular without murmurs, rubs, or gallops Respiratory: Diminished and vented breath sounds bilaterally GI: Soft, nontender, non-distended  MS: Trace bilateral pretibial edema Neuro: Intubated, sedated Psych: Unable to assess  Labs    High Sensitivity Troponin:   Recent Labs  Lab 02/04/2022 0023 02/06/2022 0153 01/24/2022 0537 02/09/2022 0638  TROPONINIHS 553* 472* 500* 450*      Chemistry Recent Labs  Lab 02/01/22 0321 02/02/22 0317 02/02/22 2012 02/03/22 0412  NA 136 135 135 135  K 4.4 3.7 3.1* 3.6  CL 104 104 104 104  CO2 20* 19* 23 24  GLUCOSE 97 123* 108* 107*  BUN 45* 56* 37* 34*  CREATININE 4.77* 5.14* 3.55* 3.44*  CALCIUM 8.6* 8.4* 9.1 8.5*  MG 2.2 2.1  --  2.2  PROT 6.4* 6.6  --  6.9  ALBUMIN 2.6* 2.6* 2.5* 2.6*  AST 256* 2,351*  --  905*  ALT 270* 2,221*  --  1,665*  ALKPHOS 63 94  --  157*  BILITOT 1.2 0.9  --  1.2  GFRNONAA 15* 14* 21* 22*  ANIONGAP 12 12 8 7      Lipids  Recent Labs  Lab 02/01/22 0321  TRIG 68     Hematology Recent Labs  Lab 02/01/22 0321 02/02/22 0317 02/03/22 0412  WBC 11.8* 9.8 6.7  RBC 5.03 5.12 5.48  HGB 10.3* 10.6* 11.1*  HCT 35.1* 35.2* 36.6*  MCV 69.8* 68.8* 66.8*  MCH 20.5* 20.7* 20.3*  MCHC 29.3* 30.1 30.3  RDW 18.6* 18.5* 18.5*  PLT 195 214 188    Thyroid  Recent Labs  Lab 02/13/2022 0638  TSH 0.920     BNP Recent Labs  Lab 01/19/2022 0023  BNP 793.1*     DDimer  Recent Labs  Lab 02/09/2022 0638  DDIMER 0.40      Radiology    US  Abdomen Limited RUQ (LIVER/GB)  Result Date: 02/02/2022 IMPRESSION: Fatty liver, otherwise unremarkable right upper quadrant ultrasound. Electronically Signed   By: Anner Crete M.D.   On: 02/02/2022 20:51   DG Chest Port 1 View  Result Date: 02/02/2022 IMPRESSION: 1. Hypoinflation with stable hazy density over the right lung and left base/retrocardiac region. 2. Stable cardiomegaly. 3. Tubes and lines as described. Electronically Signed   By: Marin Olp M.D.   On: 02/02/2022 08:39   DG Chest Port 1 View  Result Date: 02/01/2022 IMPRESSION: 1. Hypoinflation with stable patchy hazy density over the right lung and left retrocardiac region. Stable cardiomegaly. 2. Tubes and lines as described. Electronically Signed   By: Marin Olp M.D.   On: 02/01/2022 10:48    Cardiac Studies   2D echo 08/2021  1. Severe global reduction in LV function; severe LVH; suggest cardiac  MRI to R/O infiltrative cardiomyopathy or HCM.   2. Left ventricular ejection fraction, by estimation, is 20 to 25%. The  left ventricle has severely decreased function. The left ventricle  demonstrates global hypokinesis. The left ventricular internal cavity size  was moderately dilated. There is severe   left ventricular hypertrophy. Left ventricular diastolic parameters are  consistent with Grade III diastolic dysfunction (restrictive).   3. Right ventricular systolic function is normal. The right ventricular  size is normal. There is mildly elevated pulmonary artery systolic  pressure.   4. Left atrial size was severely dilated.   5. A small pericardial effusion is present.   6. The mitral valve is normal in structure. Trivial mitral valve  regurgitation. No evidence of mitral stenosis.   7. The aortic valve is tricuspid. Aortic valve regurgitation is not  visualized. No aortic stenosis is present.   8. Aortic dilatation noted. There is mild dilatation of the aortic root,  measuring 40 mm. There is mild  dilatation of the ascending aorta,  measuring 40 mm.   9. The inferior vena cava is normal in size with greater than 50%  respiratory variability, suggesting right atrial pressure of 3 mmHg.  Patient Profile     41 y.o. male with history of nonischemic cardiomyopathy, polysubstance use, obesity presenting with worsening shortness of breath and respiratory failure leading to PEA arrest s/p CPR and epi with ROSC, who is being seen for NICM  and A-fib RVR.    Assessment & Plan    HFrEF secondary to NICM: -EF 25% -Prior LHC showed normal coronary arteries -Cardiomyopathy felt to likely be in the setting of hypertensive heart disease and tachy-mediated -Not a candidate for any advanced heart failure therapy such as LVAD or transplant given medical nonadherence and prior cocaine use -Continue supportive care with pressors -Volume control with HD -ACEI/ARB/MRA/ARNI held due to AKI and hypotension -Not on beta blocker due to hypotension -Escalate GDMT as/when able    2.  Persistent atrial fibrillation: -Heart rate controlled, continue heparin drip -Amiodarone held due to bradycardia and worsening LFTs -Not on beta bloc  3.  Multiorgan failure including elevated LFTs and AKI: -Improving -Started on dialysis over the weekend  4.  Respiratory failure due to decompensated NICM: -Supportive care -Intubated, on antibiotics as per ICU team  Patient is experiencing multiorgan failure, prognosis remains guarded.       Signed, Christell Faith, PA-C  02/03/2022, 7:32 AM

## 2022-02-03 NOTE — Progress Notes (Signed)
NAME:  Gerald [name], MRN:  [id number], DOB:  1981/07/20, LOS: 4 ADMISSION DATE:  01/25/2022 CONSULTATION DATE:  02/03/2022    History of Present Illness:  41 y.o. male with history of chronic systolic and diastolic CHF last EF measured was 20 to 25% in January 2023 with history of hypertension, chronic kidney disease stage III baseline creatinine around 2,  anemia polysubstance abuse admits to taking cocaine presents to the ER because of worsening shortness of breath for the last 3 days with no associated chest pain has some productive cough.   Patient states he has not taken his medicines for last 3 days.  Admitted to [facility] with SOB    ED Course: In the ER patient was hypoxic initially requiring BiPAP but after placing on BiPAP patient had vomiting and had to be taken off the BiPAP was placed on high flow oxygen.  Patient chest x-ray shows congestion was placed on IV Lasix and also empirically on antibiotics.   BNP 790 high sensitive troponin was 553 and 4 and 72 was started on IV heparin.  Patient admitted for acute hypoxic respiratory failure likely from CHF.  Since patient was in A-fib with RVR initially was started on Cardizem infusion.  Transition to amiodarone.  PCCM consulted to assess worsening SOB and increased WOB today 6/15     Pertinent  Medical History   Active Ambulatory Problems    Diagnosis Date Noted   Community acquired pneumonia of right lung 12/21/2015   Acute respiratory distress 04/01/2019   Acute on chronic combined systolic and diastolic CHF (congestive heart failure) ([name])    Atrial flutter ([name])    Essential hypertension    PAF (paroxysmal atrial fibrillation) ([place]) 07/05/2020   Elevated troponin 07/05/2020   Microcytic anemia 07/05/2020   Hypokalemia 07/05/2020   Hypertensive emergency 12/21/2020   Stage 3a chronic kidney disease (CKD) ([place]) - baseline SCr 1.8-2.0 12/21/2020   Cocaine abuse ([place]) 01/20/2021   Obesity, Class III, BMI 40-49.9 (morbid  obesity) ([place]) 01/20/2021   Acute on chronic congestive heart failure ([place]) 03/30/2021   Restless leg 07/08/2021   Severe uncontrolled hypertension 09/05/2021   Atrial fibrillation with rapid ventricular response ([place]) 11/02/2021   Resolved Ambulatory Problems    Diagnosis Date Noted   NSTEMI (non-ST elevated myocardial infarction) ([place]) 12/21/2015   Persistent atrial fibrillation ([name])    Acute systolic congestive heart failure ([name])    Acute on chronic systolic CHF (congestive heart failure) ([place]) 07/05/2020   Hypertensive urgency 07/05/2020   Hypertensive crisis 15/72/6203   Acute diastolic CHF (congestive heart failure) ([place]) 03/30/2021   Hemoptysis 07/07/2021   Past Medical History:  Diagnosis Date   Atrial flutter, paroxysmal ([name])    CHF (congestive heart failure) ([place]) 03/30/2021   Chronic combined systolic and diastolic CHF (congestive heart failure) ([place])    Chronic kidney disease, stage III (moderate) ([place])    Dysrhythmia    Hypertension    Polysubstance abuse ([place])      Significant Hospital Events: Including procedures, antibiotic start and stop dates in addition to other pertinent events   6/14 admitted for acute sCHF 6/15 PCCM consulted for impending resp failure 02/07/2023 patient coded for 1 round with the intubation tried from respiratory arrest 06/17 patient is anuric dialysis catheter is and nephrology is involved he might need CRRT or HD 06/18 increase in liver enzymes to above 1000 we will hold amiodarone and atorvastatin 6/19 multiorgan failure, on CRRT      Micro Data:  COVID NEG  Antimicrobials:   Antibiotics Given (last 72 hours)     Date/Time Action Medication Dose Rate   01/31/22 1002 New Bag/Given   ceFEPIme (MAXIPIME) 2 g in sodium chloride 0.9 % 100 mL IVPB 2 g 200 mL/hr   01/31/22 1003 New Bag/Given   metroNIDAZOLE (FLAGYL) IVPB 500 mg 500 mg 100 mL/hr   01/31/22 1133 New Bag/Given   linezolid (ZYVOX) IVPB 600 mg 600 mg 300 mL/hr    01/31/22 2009 New Bag/Given   metroNIDAZOLE (FLAGYL) IVPB 500 mg 500 mg 100 mL/hr   01/31/22 2214 New Bag/Given   ceFEPIme (MAXIPIME) 2 g in sodium chloride 0.9 % 100 mL IVPB 2 g 200 mL/hr   01/31/22 2256 New Bag/Given   linezolid (ZYVOX) IVPB 600 mg 600 mg 300 mL/hr   02/01/22 0856 New Bag/Given   metroNIDAZOLE (FLAGYL) IVPB 500 mg 500 mg 100 mL/hr   02/01/22 1036 New Bag/Given   linezolid (ZYVOX) IVPB 600 mg 600 mg 300 mL/hr   02/01/22 2008 New Bag/Given   metroNIDAZOLE (FLAGYL) IVPB 500 mg 500 mg 100 mL/hr   02/01/22 2208 New Bag/Given   ceFEPIme (MAXIPIME) 2 g in sodium chloride 0.9 % 100 mL IVPB 2 g 200 mL/hr   02/01/22 2253 New Bag/Given   linezolid (ZYVOX) IVPB 600 mg 600 mg 300 mL/hr   02/02/22 0912 New Bag/Given   metroNIDAZOLE (FLAGYL) IVPB 500 mg 500 mg 100 mL/hr   02/02/22 1018 New Bag/Given   linezolid (ZYVOX) IVPB 600 mg 600 mg 300 mL/hr   02/02/22 2010 New Bag/Given   metroNIDAZOLE (FLAGYL) IVPB 500 mg 500 mg 100 mL/hr   02/02/22 2113 New Bag/Given   ceFEPIme (MAXIPIME) 2 g in sodium chloride 0.9 % 100 mL IVPB 2 g 200 mL/hr            Interim History / Subjective:  Remains on pressors Remains on  vent Remains on CRRT      Objective   Blood pressure (!) 89/73, pulse 83, temperature 99.9 F (37.7 C), resp. rate 20, height 6' (1.829 m), weight (!) 142 kg, SpO2 100 %.    Vent Mode: PRVC FiO2 (%):  [40 %-50 %] 40 % Set Rate:  [20 bmp] 20 bmp Vt Set:  [550 mL] 550 mL PEEP:  [10 cmH20] 10 cmH20 Plateau Pressure:  [24 cmH20-25 cmH20] 25 cmH20   Intake/Output Summary (Last 24 hours) at 02/03/2022 0719 Last data filed at 02/03/2022 0700 Gross per 24 hour  Intake 3616.62 ml  Output 6394 ml  Net -2777.38 ml    Filed Weights   02/01/22 0337 02/02/22 0328 02/03/22 0422  Weight: 132.6 kg 133.4 kg (!) 142 kg    REVIEW OF SYSTEMS  PATIENT IS UNABLE TO PROVIDE COMPLETE REVIEW OF SYSTEMS DUE TO SEVERE CRITICAL ILLNESS    PHYSICAL  EXAMINATION:  GENERAL:critically ill appearing, +resp distress EYES: Pupils equal, round, reactive to light.  No scleral icterus.  MOUTH: Moist mucosal membrane. INTUBATED NECK: Supple.  PULMONARY: +rhonchi, +wheezing CARDIOVASCULAR: S1 and S2.  No murmurs  GASTROINTESTINAL: Soft, nontender, -distended. Positive bowel sounds.  MUSCULOSKELETAL: No swelling, clubbing, or edema.  NEUROLOGIC: obtunded SKIN:intact,warm,dry     Labs/imaging that I havepersonally reviewed  (right click and "Reselect all SmartList Selections" daily)      ASSESSMENT AND PLAN SYNOPSIS  41 yo morbidly obese AAM with acute sCHF exacerbation with acute hypoxic resp failure with pulm edema and renal failure leading to severe resp failure due to severe end stage nonischemic  sCHF exacerbation leading to progressive cardiorenal syndrome with renal failure requiring CRRT  Severe ACUTE Hypoxic and Hypercapnic Respiratory Failure -continue Mechanical Ventilator support -Wean Fio2 and PEEP as tolerated -VAP/VENT bundle implementation - Wean PEEP & FiO2 as tolerated, maintain SpO2 > 88% - Head of bed elevated 30 degrees, VAP protocol in place - Plateau pressures less than 30 cm H20  - Intermittent chest x-ray & ABG PRN - Ensure adequate pulmonary hygiene  -will perform SAT/SBT when respiratory parameters are met  Vent Mode: PRVC FiO2 (%):  [40 %-50 %] 40 % Set Rate:  [20 bmp] 20 bmp Vt Set:  [550 mL] 550 mL PEEP:  [10 cmH20] 10 cmH20 Plateau Pressure:  [24 cmH20-25 cmH20] 25 [id number]   ACUTE SYSTOLIC CARDIAC FAILURE- EF20% Afib with RVR +non ischemic cardiomyopathy Continue AMIO and HEP infusions Due to increasing liver enzymes we will go ahead and hold amiodarone and statin   CARDIAC ICU monitoring  ACUTE KIDNEY INJURY/Renal Failure -continue Foley Catheter-assess need -Avoid nephrotoxic agents -Follow urine output, BMP -Ensure adequate renal perfusion, optimize oxygenation -Renal dose  medications Continues to require CRRT   Intake/Output Summary (Last 24 hours) at 02/03/2022 0722 Last data filed at 02/03/2022 0700 Gross per 24 hour  Intake 3616.62 ml  Output 6394 ml  Net -2777.38 ml    ENDO - ICU hypoglycemic\Hyperglycemia protocol -check FSBS per protocol   GI GI PROPHYLAXIS as indicated  NUTRITIONAL STATUS DIET-->TF's as tolerated Constipation protocol as indicated   ELECTROLYTES -follow labs as needed -replace as needed -pharmacy consultation and following   ACUTE ANEMIA- TRANSFUSE AS NEEDED CONSIDER TRANSFUSION  IF HGB<7      Best practice (right click and "Reselect all SmartList Selections" daily)  Diet: Trickle feed DVT prophylaxis: Systemic AC Mobility:  bed rest  Code Status:  FULL Disposition:ICU  Labs   CBC: Recent Labs  Lab 01/21/2022 0638 01/31/22 0505 01/31/22 0802 02/01/22 0321 02/02/22 0317 02/03/22 0412  WBC 9.7 14.1* 12.7* 11.8* 9.8 6.7  NEUTROABS 7.6  --  10.3* 8.8* 6.9 4.8  HGB 11.3* 11.9* 10.6* 10.3* 10.6* 11.1*  HCT 37.2* 39.9 35.3* 35.1* 35.2* 36.6*  MCV 67.1* 68.7* 68.7* 69.8* 68.8* 66.8*  PLT 229 213 218 195 214 188     Basic Metabolic Panel: Recent Labs  Lab 01/28/2022 0638 01/31/22 0505 01/31/22 0802 02/01/22 0321 02/02/22 0317 02/02/22 2012 02/03/22 0412  NA 139 137 136 136 135 135 135  K 3.6 3.5 4.1 4.4 3.7 3.1* 3.6  CL 110 106 105 104 104 104 104  CO2 20* 21* 22 20* 19* 23 24  GLUCOSE 132* 133* 131* 97 123* 108* 107*  BUN 35* 31* 33* 45* 56* 37* 34*  CREATININE 2.22* 2.44* 3.13* 4.77* 5.14* 3.55* 3.44*  CALCIUM 8.8* 8.8* 9.0 8.6* 8.4* 9.1 8.5*  MG 2.1 2.1  --  2.2 2.1  --  2.2  PHOS  --   --   --   --  6.8* 4.2 3.8    GFR: Estimated Creatinine Clearance: 41.3 mL/min (A) (by C-G formula based on SCr of 3.44 mg/dL (H)). Recent Labs  Lab 01/21/2022 0117 01/24/2022 0153 01/21/2022 0537 01/24/2022 6301 01/31/22 0802 02/01/22 0321 02/02/22 0317 02/03/22 0412  PROCALCITON  --   --  0.19  --    --   --   --   --   WBC  --   --   --    < > 12.7* 11.8* 9.8 6.7  LATICACIDVEN 1.2 1.0  --   --   --   --   --   --    < > =  values in this interval not displayed.     Liver Function Tests: Recent Labs  Lab 01/29/2022 3818 01/31/22 0802 02/01/22 0321 02/02/22 0317 02/02/22 2012 02/03/22 0412  AST 21 21 256* 2,351*  --  905*  ALT 32 26 270* 2,221*  --  1,665*  ALKPHOS 80 64 63 94  --  157*  BILITOT 1.5* 0.8 1.2 0.9  --  1.2  PROT 7.6 6.8 6.4* 6.6  --  6.9  ALBUMIN 3.4* 3.0* 2.6* 2.6* 2.5* 2.6*    No results for input(s): "LIPASE", "AMYLASE" in the last 168 hours. Recent Labs  Lab 01/31/22 0505  AMMONIA 23     ABG    Component Value Date/Time   PHART 7.27 (L) 02/02/2022 0500   PCO2ART 44 02/02/2022 0500   PO2ART 125 (H) 02/02/2022 0500   HCO3 20.2 02/02/2022 0500   ACIDBASEDEF 6.6 (H) 02/02/2022 0500   O2SAT 99.5 02/02/2022 0500     Coagulation Profile: Recent Labs  Lab 02/06/2022 0028  INR 1.5*     Cardiac Enzymes: No results for input(s): "CKTOTAL", "CKMB", "CKMBINDEX", "TROPONINI" in the last 168 hours.  HbA1C: Hgb A1c MFr Bld  Date/Time Value Ref Range Status  09/05/2021 05:00 PM 6.1 (H) 4.8 - 5.6 % Final    Comment:    (NOTE)         Prediabetes: 5.7 - 6.4         Diabetes: >6.4         Glycemic control for adults with diabetes: <7.0   03/30/2021 08:25 PM 6.2 (H) 4.8 - 5.6 % Final    Comment:    (NOTE) Pre diabetes:          5.7%-6.4%  Diabetes:              >6.4%  Glycemic control for   <7.0% adults with diabetes     CBG: Recent Labs  Lab 02/02/22 1132 02/02/22 1617 02/02/22 1948 02/02/22 2313 02/03/22 0358  GLUCAP 127* 106* 99 105* 118*       DVT/GI PRX  assessed I Assessed the need for Labs I Assessed the need for Foley I Assessed the need for Central Venous Line Family Discussion when available I Assessed the need for Mobilization I made an Assessment of medications to be adjusted accordingly Safety Risk assessment  completed  CASE DISCUSSED IN MULTIDISCIPLINARY ROUNDS WITH ICU TEAM     Critical Care Time devoted to patient care services described in this note is 50 minutes.  Critical care was necessary to treat /prevent imminent and life-threatening deterioration. Overall, patient is critically ill, prognosis is guarded.  Patient with Multiorgan failure and at high risk for cardiac arrest and death.    Corrin Parker, M.D.  [facility]  Medical Director [place] Director [facility]

## 2022-02-04 ENCOUNTER — Other Ambulatory Visit: Payer: Self-pay

## 2022-02-04 ENCOUNTER — Inpatient Hospital Stay: Payer: Medicaid Other

## 2022-02-04 DIAGNOSIS — J9601 Acute respiratory failure with hypoxia: Secondary | ICD-10-CM

## 2022-02-04 DIAGNOSIS — N179 Acute kidney failure, unspecified: Secondary | ICD-10-CM

## 2022-02-04 LAB — RENAL FUNCTION PANEL
Albumin: 2.5 g/dL — ABNORMAL LOW (ref 3.5–5.0)
Anion gap: 11 (ref 5–15)
BUN: 64 mg/dL — ABNORMAL HIGH (ref 6–20)
CO2: 23 mmol/L (ref 22–32)
Calcium: 8.8 mg/dL — ABNORMAL LOW (ref 8.9–10.3)
Chloride: 102 mmol/L (ref 98–111)
Creatinine, Ser: 6.19 mg/dL — ABNORMAL HIGH (ref 0.61–1.24)
GFR, Estimated: 11 mL/min — ABNORMAL LOW (ref >60)
Glucose, Bld: 112 mg/dL — ABNORMAL HIGH (ref 70–99)
Phosphorus: 7.3 mg/dL — ABNORMAL HIGH (ref 2.5–4.6)
Potassium: 4.1 mmol/L (ref 3.5–5.1)
Sodium: 136 mmol/L (ref 135–145)

## 2022-02-04 LAB — GLUCOSE, CAPILLARY
Glucose-Capillary: 101 mg/dL — ABNORMAL HIGH (ref 70–99)
Glucose-Capillary: 109 mg/dL — ABNORMAL HIGH (ref 70–99)
Glucose-Capillary: 112 mg/dL — ABNORMAL HIGH (ref 70–99)
Glucose-Capillary: 121 mg/dL — ABNORMAL HIGH (ref 70–99)
Glucose-Capillary: 123 mg/dL — ABNORMAL HIGH (ref 70–99)
Glucose-Capillary: 126 mg/dL — ABNORMAL HIGH (ref 70–99)
Glucose-Capillary: 133 mg/dL — ABNORMAL HIGH (ref 70–99)

## 2022-02-04 LAB — CBC
HCT: 35.3 % — ABNORMAL LOW (ref 39.0–52.0)
Hemoglobin: 10.5 g/dL — ABNORMAL LOW (ref 13.0–17.0)
MCH: 20.2 pg — ABNORMAL LOW (ref 26.0–34.0)
MCHC: 29.7 g/dL — ABNORMAL LOW (ref 30.0–36.0)
MCV: 68 fL — ABNORMAL LOW (ref 80.0–100.0)
Platelets: 198 10*3/uL (ref 150–400)
RBC: 5.19 MIL/uL (ref 4.22–5.81)
RDW: 18.6 % — ABNORMAL HIGH (ref 11.5–15.5)
WBC: 6.6 10*3/uL (ref 4.0–10.5)
nRBC: 2.3 % — ABNORMAL HIGH (ref 0.0–0.2)

## 2022-02-04 LAB — COMPREHENSIVE METABOLIC PANEL
ALT: 1232 U/L — ABNORMAL HIGH (ref 0–44)
AST: 455 U/L — ABNORMAL HIGH (ref 15–41)
Albumin: 2.6 g/dL — ABNORMAL LOW (ref 3.5–5.0)
Alkaline Phosphatase: 171 U/L — ABNORMAL HIGH (ref 38–126)
Anion gap: 8 (ref 5–15)
BUN: 44 mg/dL — ABNORMAL HIGH (ref 6–20)
CO2: 23 mmol/L (ref 22–32)
Calcium: 8.3 mg/dL — ABNORMAL LOW (ref 8.9–10.3)
Chloride: 106 mmol/L (ref 98–111)
Creatinine, Ser: 4.26 mg/dL — ABNORMAL HIGH (ref 0.61–1.24)
GFR, Estimated: 17 mL/min — ABNORMAL LOW (ref >60)
Glucose, Bld: 113 mg/dL — ABNORMAL HIGH (ref 70–99)
Potassium: 3.8 mmol/L (ref 3.5–5.1)
Sodium: 137 mmol/L (ref 135–145)
Total Bilirubin: 0.9 mg/dL (ref 0.3–1.2)
Total Protein: 7 g/dL (ref 6.5–8.1)

## 2022-02-04 LAB — CULTURE, BLOOD (ROUTINE X 2)
Culture: NO GROWTH
Culture: NO GROWTH
Special Requests: ADEQUATE
Special Requests: ADEQUATE

## 2022-02-04 LAB — PHOSPHORUS: Phosphorus: 5.1 mg/dL — ABNORMAL HIGH (ref 2.5–4.6)

## 2022-02-04 LAB — HEPATITIS B SURFACE ANTIBODY,QUALITATIVE: Hep B S Ab: NONREACTIVE

## 2022-02-04 LAB — MYCOPLASMA PNEUMONIAE ANTIBODY, IGM: Mycoplasma pneumo IgM: <770 U/mL (ref 0–769)

## 2022-02-04 LAB — HEPATITIS B SURFACE ANTIGEN: Hepatitis B Surface Ag: NONREACTIVE

## 2022-02-04 LAB — MAGNESIUM: Magnesium: 2.4 mg/dL (ref 1.7–2.4)

## 2022-02-04 LAB — HEPATITIS C ANTIBODY: HCV Ab: NONREACTIVE

## 2022-02-04 LAB — HEPARIN LEVEL (UNFRACTIONATED): Heparin Unfractionated: 0.58 IU/mL (ref 0.30–0.70)

## 2022-02-04 LAB — TRIGLYCERIDES: Triglycerides: 118 mg/dL (ref <150)

## 2022-02-04 LAB — HEPATITIS B CORE ANTIBODY, TOTAL: Hep B Core Total Ab: NONREACTIVE

## 2022-02-04 MED ORDER — VITAL HIGH PROTEIN PO LIQD
1000.0000 mL | ORAL | Status: DC
Start: 2022-02-04 — End: 2022-02-06
  Administered 2022-02-05 – 2022-02-06 (×2): 1000 mL

## 2022-02-04 MED ORDER — ADULT MULTIVITAMIN LIQUID CH
15.0000 mL | Freq: Every day | ORAL | Status: DC
  Administered 2022-02-06: 15 mL
  Filled 2022-02-04 (×2): qty 15

## 2022-02-04 MED ORDER — PROSOURCE TF PO LIQD
90.0000 mL | Freq: Three times a day (TID) | ORAL | Status: DC
  Administered 2022-02-04 – 2022-02-06 (×7): 90 mL
  Filled 2022-02-04 (×7): qty 90

## 2022-02-04 MED ORDER — FENTANYL BOLUS VIA INFUSION
50.0000 ug | Freq: Once | INTRAVENOUS | Status: AC
  Administered 2022-02-04: 50 ug via INTRAVENOUS

## 2022-02-04 NOTE — Progress Notes (Signed)
Updated pt's mother Elita Boone via telephone.  We discussed failed SBT this morning, now sustaining his own BP, with transition from CRRT to HD.  All questions answered to her satisfaction.   Darel Hong, AGACNP-BC [facility] Prefer epic messenger for cross cover needs If after hours, please call E-link

## 2022-02-04 NOTE — Progress Notes (Signed)
Progress Note  Patient Name: Gerald [name] Date of Encounter: 02/04/2022  [facility] Cardiologist: Ida Rogue, MD   Subjective   He is still intubated and sedated.  He is still not making any urine.  Inpatient Medications    Scheduled Meds:  alteplase  2 mg Intracatheter Once   budesonide (PULMICORT) nebulizer solution  0.5 mg Nebulization BID   Chlorhexidine Gluconate Cloth  6 each Topical Q0600   docusate  100 mg Per Tube BID   feeding supplement (PROSource TF)  90 mL Per Tube TID   free water  30 mL Per Tube Q4H   insulin aspart  0-6 Units Subcutaneous Q4H   ipratropium-albuterol  3 mL Nebulization Q4H   lidocaine  1 patch Transdermal Q24H   multivitamin  1 tablet Per Tube QHS   [START ON 02/05/2022] multivitamin  15 mL Per Tube Daily   mouth rinse  15 mL Mouth Rinse Q2H   pantoprazole sodium  40 mg Per Tube Daily   polyethylene glycol  17 g Per Tube Daily   Continuous Infusions:  sodium chloride Stopped (02/02/22 2010)   feeding supplement (VITAL HIGH PROTEIN) 40 mL/hr at 02/04/22 1600   fentaNYL infusion INTRAVENOUS 125 mcg/hr (02/04/22 1600)   phenylephrine (NEO-SYNEPHRINE) Adult infusion Stopped (02/03/22 1208)   propofol (DIPRIVAN) infusion 40 mcg/kg/min (02/04/22 1600)   PRN Meds: sodium chloride, acetaminophen, guaiFENesin-dextromethorphan, heparin, mouth rinse, mouth rinse   Vital Signs    Vitals:   02/04/22 1502 02/04/22 1544 02/04/22 1557 02/04/22 1600  BP:      Pulse: 84 85 (!) 53 64  Resp: 18 20 20 19   Temp: 98.8 F (37.1 C) 99 F (37.2 C) 99 F (37.2 C) 99 F (37.2 C)  TempSrc:  Bladder    SpO2: 99% 97% 97% 96%  Weight:      Height:        Intake/Output Summary (Last 24 hours) at 02/04/2022 1725 Last data filed at 02/04/2022 1600 Gross per 24 hour  Intake 2470.16 ml  Output 1192.8 ml  Net 1277.36 ml       02/04/2022    5:00 AM 02/03/2022    4:22 AM 02/02/2022    3:28 AM  Last 3 Weights  Weight (lbs) 319 lb 3.6 oz 313 lb  0.9 oz 294 lb 1.5 oz  Weight (kg) 144.8 kg 142 kg 133.4 kg      Telemetry    Atrial fibrillation, ventricular rates in the 60s to 80s bpm - Personally Reviewed  ECG     No new tracings - Personally Reviewed  Physical Exam   GEN: Intubated, sedated Neck: Unable to assess for JVD Cardiac: Irregular irregular without murmurs, rubs, or gallops Respiratory: Diminished and vented breath sounds bilaterally GI: Soft, nontender, non-distended  MS: Trace bilateral pretibial edema Neuro: Intubated, sedated Psych: Unable to assess  Labs    High Sensitivity Troponin:   Recent Labs  Lab 01/20/2022 0023 02/01/2022 0153 01/27/2022 0537 02/13/2022 0638  TROPONINIHS 553* 472* 500* 450*      Chemistry Recent Labs  Lab 02/02/22 0317 02/02/22 2012 02/03/22 0412 02/03/22 1535 02/04/22 0412  NA 135   < > 135 135 137  K 3.7   < > 3.6 3.8 3.8  CL 104   < > 104 103 106  CO2 19*   < > 24 23 23   GLUCOSE 123*   < > 107* 124* 113*  BUN 56*   < > 34* 37* 44*  CREATININE  5.14*   < > 3.44* 3.36* 4.26*  CALCIUM 8.4*   < > 8.5* 8.8* 8.3*  MG 2.1  --  2.2  --  2.4  PROT 6.6  --  6.9  --  7.0  ALBUMIN 2.6*   < > 2.6* 2.5* 2.6*  AST 2,351*  --  905*  --  455*  ALT 2,221*  --  1,665*  --  1,232*  ALKPHOS 94  --  157*  --  171*  BILITOT 0.9  --  1.2  --  0.9  GFRNONAA 14*   < > 22* 23* 17*  ANIONGAP 12   < > 7 9 8    < > = values in this interval not displayed.     Lipids  Recent Labs  Lab 02/04/22 0412  TRIG 118     Hematology Recent Labs  Lab 02/02/22 0317 02/03/22 0412 02/04/22 0412  WBC 9.8 6.7 6.6  RBC 5.12 5.48 5.19  HGB 10.6* 11.1* 10.5*  HCT 35.2* 36.6* 35.3*  MCV 68.8* 66.8* 68.0*  MCH 20.7* 20.3* 20.2*  MCHC 30.1 30.3 29.7*  RDW 18.5* 18.5* 18.6*  PLT 214 188 198    Thyroid  Recent Labs  Lab 01/28/2022 0638  TSH 0.920     BNP Recent Labs  Lab 02/12/2022 0023  BNP 793.1*     DDimer  Recent Labs  Lab 02/11/2022 0638  DDIMER 0.40      Radiology    US  Abdomen Limited RUQ (LIVER/GB)  Result Date: 02/02/2022 IMPRESSION: Fatty liver, otherwise unremarkable right upper quadrant ultrasound. Electronically Signed   By: Anner Crete M.D.   On: 02/02/2022 20:51   DG Chest Port 1 View  Result Date: 02/02/2022 IMPRESSION: 1. Hypoinflation with stable hazy density over the right lung and left base/retrocardiac region. 2. Stable cardiomegaly. 3. Tubes and lines as described. Electronically Signed   By: Marin Olp M.D.   On: 02/02/2022 08:39   DG Chest Port 1 View  Result Date: 02/01/2022 IMPRESSION: 1. Hypoinflation with stable patchy hazy density over the right lung and left retrocardiac region. Stable cardiomegaly. 2. Tubes and lines as described. Electronically Signed   By: Marin Olp M.D.   On: 02/01/2022 10:48    Cardiac Studies   2D echo 08/2021  1. Severe global reduction in LV function; severe LVH; suggest cardiac  MRI to R/O infiltrative cardiomyopathy or HCM.   2. Left ventricular ejection fraction, by estimation, is 20 to 25%. The  left ventricle has severely decreased function. The left ventricle  demonstrates global hypokinesis. The left ventricular internal cavity size  was moderately dilated. There is severe   left ventricular hypertrophy. Left ventricular diastolic parameters are  consistent with Grade III diastolic dysfunction (restrictive).   3. Right ventricular systolic function is normal. The right ventricular  size is normal. There is mildly elevated pulmonary artery systolic  pressure.   4. Left atrial size was severely dilated.   5. A small pericardial effusion is present.   6. The mitral valve is normal in structure. Trivial mitral valve  regurgitation. No evidence of mitral stenosis.   7. The aortic valve is tricuspid. Aortic valve regurgitation is not  visualized. No aortic stenosis is present.   8. Aortic dilatation noted. There is mild dilatation of the aortic root,  measuring 40 mm. There is mild  dilatation of the ascending aorta,  measuring 40 mm.   9. The inferior vena cava is normal in size with greater than  50%  respiratory variability, suggesting right atrial pressure of 3 mmHg.  Patient Profile     41 y.o. male with history of nonischemic cardiomyopathy, polysubstance use, obesity presenting with worsening shortness of breath and respiratory failure leading to PEA arrest s/p CPR and epi with ROSC, who is being seen for NICM and A-fib RVR.    Assessment & Plan    HFrEF secondary to NICM: -EF 25% -Prior LHC showed normal coronary arteries -Cardiomyopathy felt to likely be in the setting of hypertensive heart disease and tachy-mediated -Not a candidate for any advanced heart failure therapy such as LVAD or transplant given medical nonadherence and prior cocaine use -Continue supportive care with pressors -Volume control with HD -ACEI/ARB/MRA/ARNI held due to AKI and hypotension -Not on beta blocker due to hypotension -Escalate GDMT as/when able  -Given that he is likely dialysis dependent at this point, there is not much benefit from starting inotropic therapy.  I discussed with Dr. Candiss Norse.   2.  Persistent atrial fibrillation: -Heart rate controlled, continue heparin drip -Amiodarone held due to bradycardia and worsening LFTs -Not on beta bloc  3.  Multiorgan failure including elevated LFTs and AKI: -Improving -Started on dialysis over the weekend  4.  Respiratory failure due to decompensated NICM: -Supportive care -Intubated, on antibiotics as per ICU team Failed weaning trial. Overall prognosis is poor.      Signed, Kathlyn Sacramento, MD  02/04/2022, 5:25 PM

## 2022-02-04 NOTE — Progress Notes (Signed)
NAME:  Gerald [name], MRN:  [id number], DOB:  02-Jan-1981, LOS: 5 ADMISSION DATE:  01/27/2022 CONSULTATION DATE:  02/04/2022  Brief patient description Gerald [name]:  41 yo morbidly obese AAM with acute sCHF exacerbation with acute hypoxic resp failure with pulm edema and renal failure leading to severe resp failure due to severe end stage nonischemic sCHF exacerbation leading to progressive cardiorenal syndrome with renal failure requiring CRRT  History of Present Illness:  41 y.o. male with history of chronic systolic and diastolic CHF last EF measured was 20 to 25% in January 2023 with history of hypertension, chronic kidney disease stage III baseline creatinine around 2,  anemia polysubstance abuse admits to taking cocaine presents to the ER because of worsening shortness of breath for the last 3 days with no associated chest pain has some productive cough.   Patient states he has not taken his medicines for last 3 days.  Admitted to [facility] with SOB    ED Course: In the ER patient was hypoxic initially requiring BiPAP but after placing on BiPAP patient had vomiting and had to be taken off the BiPAP was placed on high flow oxygen.  Patient chest x-ray shows congestion was placed on IV Lasix and also empirically on antibiotics.   BNP 790 high sensitive troponin was 553 and 4 and 72 was started on IV heparin.  Patient admitted for acute hypoxic respiratory failure likely from CHF.  Since patient was in A-fib with RVR initially was started on Cardizem infusion.  Transition to amiodarone.  PCCM consulted to assess worsening SOB and increased WOB today 6/15   Pertinent  Medical History   Active Ambulatory Problems    Diagnosis Date Noted   Community acquired pneumonia of right lung 12/21/2015   Acute respiratory distress 04/01/2019   Acute on chronic combined systolic and diastolic CHF (congestive heart failure) ([name])    Atrial flutter ([name])    Essential hypertension    PAF (paroxysmal atrial  fibrillation) ([place]) 07/05/2020   Elevated troponin 07/05/2020   Microcytic anemia 07/05/2020   Hypokalemia 07/05/2020   Hypertensive emergency 12/21/2020   Stage 3a chronic kidney disease (CKD) ([place]) - baseline SCr 1.8-2.0 12/21/2020   Cocaine abuse ([place]) 01/20/2021   Obesity, Class III, BMI 40-49.9 (morbid obesity) ([place]) 01/20/2021   Acute on chronic congestive heart failure ([place]) 03/30/2021   Restless leg 07/08/2021   Severe uncontrolled hypertension 09/05/2021   Atrial fibrillation with rapid ventricular response ([place]) 11/02/2021   Resolved Ambulatory Problems    Diagnosis Date Noted   NSTEMI (non-ST elevated myocardial infarction) ([place]) 12/21/2015   Persistent atrial fibrillation ([name])    Acute systolic congestive heart failure ([name])    Acute on chronic systolic CHF (congestive heart failure) ([place]) 07/05/2020   Hypertensive urgency 07/05/2020   Hypertensive crisis 53/66/4403   Acute diastolic CHF (congestive heart failure) ([place]) 03/30/2021   Hemoptysis 07/07/2021   Past Medical History:  Diagnosis Date   Atrial flutter, paroxysmal ([name])    CHF (congestive heart failure) ([place]) 03/30/2021   Chronic combined systolic and diastolic CHF (congestive heart failure) ([place])    Chronic kidney disease, stage III (moderate) ([place])    Dysrhythmia    Hypertension    Polysubstance abuse ([place])     Significant Hospital Events: Including procedures, antibiotic start and stop dates in addition to other pertinent events   6/14 admitted for acute sCHF 6/15 PCCM consulted for impending resp failure 03-03-23 patient coded for 1 round with the intubation tried from respiratory  arrest 06/17 patient is anuric dialysis catheter is and nephrology is involved he might need CRRT or HD 06/18 increase in liver enzymes to above 1000 we will hold amiodarone and atorvastatin 6/19 multiorgan failure, on CRRT  6/20: Failed SBT due to delirium and increased work of breathing, tachycardia, diaphoresis.   Transitioned from CRRT to intermittent HD (plan for first HD session 6/21)   Micro Data:  6/15: SARS-CoV-2 and influenza PCR>> negative 6/15: Blood culture x2>> no growth 6/15: MRSA PCR>> negative 6/15: Sputum>> normal respiratory flora 6/15: Strep pneumo and Legionella urinary antigens>> negative 6/16: Mycoplasma pneumonia a IgM>> less than 770 6/18: Hepatitis B>> nonreactive   Antimicrobials:   Antibiotics Given (last 72 hours)     Date/Time Action Medication Dose Rate   02/01/22 0856 New Bag/Given   metroNIDAZOLE (FLAGYL) IVPB 500 mg 500 mg 100 mL/hr   02/01/22 1036 New Bag/Given   linezolid (ZYVOX) IVPB 600 mg 600 mg 300 mL/hr   02/01/22 2008 New Bag/Given   metroNIDAZOLE (FLAGYL) IVPB 500 mg 500 mg 100 mL/hr   02/01/22 2208 New Bag/Given   ceFEPIme (MAXIPIME) 2 g in sodium chloride 0.9 % 100 mL IVPB 2 g 200 mL/hr   02/01/22 2253 New Bag/Given   linezolid (ZYVOX) IVPB 600 mg 600 mg 300 mL/hr   02/02/22 0912 New Bag/Given   metroNIDAZOLE (FLAGYL) IVPB 500 mg 500 mg 100 mL/hr   02/02/22 1018 New Bag/Given   linezolid (ZYVOX) IVPB 600 mg 600 mg 300 mL/hr   02/02/22 2010 New Bag/Given   metroNIDAZOLE (FLAGYL) IVPB 500 mg 500 mg 100 mL/hr   02/02/22 2113 New Bag/Given   ceFEPIme (MAXIPIME) 2 g in sodium chloride 0.9 % 100 mL IVPB 2 g 200 mL/hr   02/03/22 0859 New Bag/Given   metroNIDAZOLE (FLAGYL) IVPB 500 mg 500 mg 100 mL/hr   02/03/22 1005 New Bag/Given   ceFEPIme (MAXIPIME) 2 g in sodium chloride 0.9 % 100 mL IVPB 2 g 200 mL/hr        Interim History / Subjective:  -Overnight issues with femoral trialysis catheter functioning, required placement of left IJ trialysis catheter -Patient with bleeding from femoral site following removal of trialysis catheter ~heparin drip held and PAD device in place ~bleeding currently appears to have subsided, will continue to hold heparin drip today with plans to resume tomorrow -Afebrile, hemodynamically stable, no  vasopressors -Minimal vent settings: 40% FiO2  -Patient failed SBT today given delirium, increased work of breathing and accessory muscle use, tachypnea, tachycardia and hypertension -LFTs are trending down -Patient has been off pressors for approximately 24 hours, discussed with nephrology, will plan for transition to intermittent HD with first session planned for 6/21    Objective   Blood pressure (!) 89/73, pulse 84, temperature 99.3 F (37.4 C), temperature source Bladder, resp. rate 18, height 6' (1.829 m), weight (!) 144.8 kg, SpO2 99 %.    Vent Mode: PRVC FiO2 (%):  [40 %] 40 % Set Rate:  [20 bmp] 20 bmp Vt Set:  [550 mL] 550 mL PEEP:  [5 cmH20-8 cmH20] 5 cmH20   Intake/Output Summary (Last 24 hours) at 02/04/2022 5397 Last data filed at 02/04/2022 0800 Gross per 24 hour  Intake 3127.46 ml  Output 3261.2 ml  Net -133.74 ml    Filed Weights   02/02/22 0328 02/03/22 0422 02/04/22 0500  Weight: 133.4 kg (!) 142 kg (!) 144.8 kg    REVIEW OF SYSTEMS  PATIENT IS UNABLE TO PROVIDE COMPLETE REVIEW OF SYSTEMS DUE  TO SEVERE CRITICAL ILLNESS    PHYSICAL EXAMINATION:  GENERAL: Acute on chronically ill-appearing obese male, laying in bed, intubated and lightly sedated in no acute distress EYES: Pupils equal, round, reactive to light.  No scleral icterus.  MOUTH: Moist mucosal membrane.  Orally INTUBATED NECK: Supple, difficult to assess JVD due to body habitus PULMONARY: Coarse breath sounds bilaterally, no wheezing, occasionally overbreathing the vent CARDIOVASCULAR: Tachycardia, regular rhythm, S1-S2, no murmurs, rubs, gallops GASTROINTESTINAL: Obese, soft, nontender, -distended. Positive bowel sounds.  MUSCULOSKELETAL: No swelling, clubbing, or edema.  NEUROLOGIC: Sedation currently off for wake-up assessment, moving all extremities but not following commands, pupils PERRLA SKIN:intact,warm,dry     Labs/imaging that I havepersonally reviewed  (right click and "Reselect  all SmartList Selections" daily)      ASSESSMENT AND PLAN   ACUTE Hypoxic  Respiratory Failure in setting of acute decompensated HFrEF -Full vent support, implement lung protective strategies -Plateau pressures less than 30 cm H20 -Wean FiO2 & PEEP as tolerated to maintain O2 sats >92% -Follow intermittent Chest X-ray & ABG as needed -Spontaneous Breathing Trials when respiratory parameters met and mental status permits -Implement VAP Bundle -Bronchodilators & Pulmicort nebs -Volume removal with dialysis  Acute Decompensated Combined Systolic & Diastolic CHF (EF 60%) secondary to NICM Afib with RVR Mildly elevated troponin, suspect demand ischemia Echocardiogram from 09/05/21: LVEF 20 to 10%, grade 3 diastolic dysfunction, RV systolic function normal, pulmonary artery hypertension -Continuous cardiac monitoring -Maintain MAP >65 -Vasopressors as needed to maintain MAP goal ~vasopressors currently weaned off -Lactic acid has normalized -HS Troponin peaked at 553 -Echocardiogram from 09/05/21: LVEF 20 to 93%, grade 3 diastolic dysfunction, RV systolic function normal, pulmonary artery hypertension -Cardiology following, appreciate input -Hold Heparin infusion due to bleeding from femoral site, plan to resume 6/21 -Due to increased liver enzymes, holding amiodarone and statin  ACUTE KIDNEY INJURY, suspect Cardiorenal Syndrome  -Monitor I&O's / urinary output -Follow BMP -Ensure adequate renal perfusion -Avoid nephrotoxic agents as able -Replace electrolytes as indicated -Nephrology following, appreciate input -CRRT vs. HD as per Nephrology  Elevated LFTs, suspect in the setting of congestion with CHF ~improving -Trend LFTs -Continue to hold amiodarone and statins for now -RUQ abdominal ultrasound with fatty liver, otherwise unremarkable  Hyperglycemia -CBG's q4h; Target range of 140 to 180 -SSI -Follow ICU Hypo/Hyperglycemia protocol  ANEMIA without s/sx of overt  bleeding -Monitor for S/Sx of bleeding -Trend CBC -SCD's for VTE Prophylaxis  -Transfuse for Hgb <7  Acute Metabolic Encephalopathy Sedation needs in setting of mechanical ventilation PMHx: Polysubstance abuse -Maintain a RASS goal of 0 to -1 -Fentanyl and propofol as needed to maintain RASS goal -Avoid sedating medications as able -Daily wake up assessment -UDS negative      Best practice (right click and "Reselect all SmartList Selections" daily)  Diet: Tube feeds DVT prophylaxis: Systemic AC Mobility:  bed rest  Code Status:  FULL Disposition:ICU  Labs   CBC: Recent Labs  Lab 02/02/2022 0638 01/31/22 0505 01/31/22 0802 02/01/22 0321 02/02/22 0317 02/03/22 0412 02/04/22 0412  WBC 9.7   < > 12.7* 11.8* 9.8 6.7 6.6  NEUTROABS 7.6  --  10.3* 8.8* 6.9 4.8  --   HGB 11.3*   < > 10.6* 10.3* 10.6* 11.1* 10.5*  HCT 37.2*   < > 35.3* 35.1* 35.2* 36.6* 35.3*  MCV 67.1*   < > 68.7* 69.8* 68.8* 66.8* 68.0*  PLT 229   < > 218 195 214 188 198   < > = values in  this interval not displayed.     Basic Metabolic Panel: Recent Labs  Lab 01/31/22 0505 01/31/22 0802 02/01/22 0321 02/02/22 0317 02/02/22 2012 02/03/22 0412 02/03/22 1535 02/04/22 0412  NA 137   < > 136 135 135 135 135 137  K 3.5   < > 4.4 3.7 3.1* 3.6 3.8 3.8  CL 106   < > 104 104 104 104 103 106  CO2 21*   < > 20* 19* '23 24 23 23  ' GLUCOSE 133*   < > 97 123* 108* 107* 124* 113*  BUN 31*   < > 45* 56* 37* 34* 37* 44*  CREATININE 2.44*   < > 4.77* 5.14* 3.55* 3.44* 3.36* 4.26*  CALCIUM 8.8*   < > 8.6* 8.4* 9.1 8.5* 8.8* 8.3*  MG 2.1  --  2.2 2.1  --  2.2  --  2.4  PHOS  --   --   --  6.8* 4.2 3.8 4.5 5.1*   < > = values in this interval not displayed.    GFR: Estimated Creatinine Clearance: 33.7 mL/min (A) (by C-G formula based on SCr of 4.26 mg/dL (H)). Recent Labs  Lab 01/21/2022 0117 02/13/2022 0153 01/16/2022 0537 01/26/2022 6503 02/01/22 0321 02/02/22 0317 02/03/22 0412 02/04/22 0412  PROCALCITON   --   --  0.19  --   --   --   --   --   WBC  --   --   --    < > 11.8* 9.8 6.7 6.6  LATICACIDVEN 1.2 1.0  --   --   --   --   --   --    < > = values in this interval not displayed.     Liver Function Tests: Recent Labs  Lab 01/31/22 0802 02/01/22 0321 02/02/22 0317 02/02/22 2012 02/03/22 0412 02/03/22 1535 02/04/22 0412  AST 21 256* 2,351*  --  905*  --  455*  ALT 26 270* 2,221*  --  1,665*  --  1,232*  ALKPHOS 64 63 94  --  157*  --  171*  BILITOT 0.8 1.2 0.9  --  1.2  --  0.9  PROT 6.8 6.4* 6.6  --  6.9  --  7.0  ALBUMIN 3.0* 2.6* 2.6* 2.5* 2.6* 2.5* 2.6*    No results for input(s): "LIPASE", "AMYLASE" in the last 168 hours. Recent Labs  Lab 01/31/22 0505  AMMONIA 23     ABG    Component Value Date/Time   PHART 7.27 (L) 02/02/2022 0500   PCO2ART 44 02/02/2022 0500   PO2ART 125 (H) 02/02/2022 0500   HCO3 20.2 02/02/2022 0500   ACIDBASEDEF 6.6 (H) 02/02/2022 0500   O2SAT 99.5 02/02/2022 0500     Coagulation Profile: Recent Labs  Lab 01/31/2022 0028  INR 1.5*     Cardiac Enzymes: No results for input(s): "CKTOTAL", "CKMB", "CKMBINDEX", "TROPONINI" in the last 168 hours.  HbA1C: Hgb A1c MFr Bld  Date/Time Value Ref Range Status  09/05/2021 05:00 PM 6.1 (H) 4.8 - 5.6 % Final    Comment:    (NOTE)         Prediabetes: 5.7 - 6.4         Diabetes: >6.4         Glycemic control for adults with diabetes: <7.0   03/30/2021 08:25 PM 6.2 (H) 4.8 - 5.6 % Final    Comment:    (NOTE) Pre diabetes:  5.7%-6.4%  Diabetes:              >6.4%  Glycemic control for   <7.0% adults with diabetes     CBG: Recent Labs  Lab 02/03/22 1520 02/03/22 1922 02/04/22 0000 02/04/22 0617 02/04/22 0736  GLUCAP 122* 121* 126* 112* 123*       DVT/GI PRX  assessed I Assessed the need for Labs I Assessed the need for Foley I Assessed the need for Central Venous Line Family Discussion when available I Assessed the need for Mobilization I made an  Assessment of medications to be adjusted accordingly Safety Risk assessment completed  CASE DISCUSSED IN MULTIDISCIPLINARY ROUNDS WITH ICU TEAM    Critical Care Time: 40 minutes  Darel Hong, AGACNP-BC [facility] Prefer epic messenger for cross cover needs If after hours, please call E-link

## 2022-02-04 NOTE — Progress Notes (Signed)
Pt CRRT machine alarming low access pressure, attempted to draw back blood from red lumen with no success. Blue lumen did have draw back with some difficulty. TPA was dwelled for 2 hours in trialysis cath. After the 2 hours blood was able to be drawn back from both lumens. CRRT was prepped and restarted, however access pressure was immediately alarming again. CRRT would run fine once slight pressure was placed on top of catheter. However after about 30 mins the pressure increased again and CRRT could not be continued. Treatment was stopped and disconnected, NP was notified of TPA and of catheter only working for under certain situations. Other attempts were done with laying the patient down and attempting a pressure dressing with no avail. NP to evaluate if another trialysis cath or site is needed.

## 2022-02-04 NOTE — Plan of Care (Signed)
  Problem: Education: Goal: Knowledge of General Education information will improve Description: Including pain rating scale, medication(s)/side effects and non-pharmacologic comfort measures Outcome: Not Progressing   Problem: Health Behavior/Discharge Planning: Goal: Ability to manage health-related needs will improve Outcome: Not Progressing   Problem: Clinical Measurements: Goal: Ability to maintain clinical measurements within normal limits will improve Outcome: Not Progressing Goal: Will remain free from infection Outcome: Not Progressing Goal: Diagnostic test results will improve Outcome: Not Progressing Goal: Respiratory complications will improve Outcome: Not Progressing Goal: Cardiovascular complication will be avoided Outcome: Not Progressing   Problem: Activity: Goal: Risk for activity intolerance will decrease Outcome: Not Progressing   Problem: Nutrition: Goal: Adequate nutrition will be maintained Outcome: Not Progressing   Problem: Coping: Goal: Level of anxiety will decrease Outcome: Not Progressing   Problem: Elimination: Goal: Will not experience complications related to bowel motility Outcome: Not Progressing Goal: Will not experience complications related to urinary retention Outcome: Not Progressing   Problem: Pain Managment: Goal: General experience of comfort will improve Outcome: Not Progressing   Problem: Safety: Goal: Ability to remain free from injury will improve Outcome: Not Progressing   Problem: Skin Integrity: Goal: Risk for impaired skin integrity will decrease Outcome: Not Progressing   Problem: Education: Goal: Ability to demonstrate management of disease process will improve Outcome: Not Progressing Goal: Ability to verbalize understanding of medication therapies will improve Outcome: Not Progressing Goal: Individualized Educational Video(s) Outcome: Not Progressing   Problem: Activity: Goal: Capacity to carry out  activities will improve Outcome: Not Progressing   Problem: Cardiac: Goal: Ability to achieve and maintain adequate cardiopulmonary perfusion will improve Outcome: Not Progressing   Problem: Education: Goal: Ability to describe self-care measures that may prevent or decrease complications (Diabetes Survival Skills Education) will improve Outcome: Not Progressing Goal: Individualized Educational Video(s) Outcome: Not Progressing   Problem: Coping: Goal: Ability to adjust to condition or change in health will improve Outcome: Not Progressing   Problem: Fluid Volume: Goal: Ability to maintain a balanced intake and output will improve Outcome: Not Progressing   Problem: Health Behavior/Discharge Planning: Goal: Ability to identify and utilize available resources and services will improve Outcome: Not Progressing Goal: Ability to manage health-related needs will improve Outcome: Not Progressing   Problem: Metabolic: Goal: Ability to maintain appropriate glucose levels will improve Outcome: Not Progressing   Problem: Nutritional: Goal: Maintenance of adequate nutrition will improve Outcome: Not Progressing Goal: Progress toward achieving an optimal weight will improve Outcome: Not Progressing   Problem: Skin Integrity: Goal: Risk for impaired skin integrity will decrease Outcome: Not Progressing   Problem: Tissue Perfusion: Goal: Adequacy of tissue perfusion will improve Outcome: Not Progressing   Problem: Education: Goal: Knowledge of disease and its progression will improve Outcome: Not Progressing Goal: Individualized Educational Video(s) Outcome: Not Progressing   Problem: Fluid Volume: Goal: Compliance with measures to maintain balanced fluid volume will improve Outcome: Not Progressing   Problem: Health Behavior/Discharge Planning: Goal: Ability to manage health-related needs will improve Outcome: Not Progressing   Problem: Nutritional: Goal: Ability to  make healthy dietary choices will improve Outcome: Not Progressing   Problem: Clinical Measurements: Goal: Complications related to the disease process, condition or treatment will be avoided or minimized Outcome: Not Progressing

## 2022-02-04 NOTE — Progress Notes (Signed)
ANTICOAGULATION CONSULT NOTE   Pharmacy Consult for Heparin Infusion Indication: ACS/STEMI  Patient Measurements: Height: 6' (182.9 cm) Weight: (!) 142 kg (313 lb 0.9 oz) IBW/kg (Calculated) : 77.6 Heparin Dosing Weight: 109.9 kg  Labs: Recent Labs    02/01/22 0755 02/01/22 0755 02/02/22 0317 02/02/22 1504 02/02/22 2012 02/02/22 2306 02/03/22 0412 02/03/22 1535 02/04/22 0412  HGB  --    < > 10.6*  --   --   --  11.1*  --  10.5*  HCT  --   --  35.2*  --   --   --  36.6*  --  35.3*  PLT  --   --  214  --   --   --  188  --  198  APTT 81*  --  64* 65*  --   --   --   --   --   HEPARINUNFRC 0.55  --  0.45 0.48  --  0.48 0.46  --  0.58  CREATININE  --    < > 5.14*  --  3.55*  --  3.44* 3.36*  --    < > = values in this interval not displayed.     Estimated Creatinine Clearance: 42.3 mL/min (A) (by C-G formula based on SCr of 3.36 mg/dL (H)).   Medical History: Past Medical History:  Diagnosis Date   Acute on chronic combined systolic and diastolic CHF (congestive heart failure) ([name])    Acute on chronic systolic CHF (congestive heart failure) ([place]) 20/94/7096   Acute systolic congestive heart failure ([name])    Atrial flutter, paroxysmal ([name])    CHF (congestive heart failure) ([place]) 03/30/2021   Chronic combined systolic and diastolic CHF (congestive heart failure) ([name])    Chronic kidney disease, stage III (moderate) ([name])    Dysrhythmia    brief episode afib, cardioverted did not return   Hypertension    Hypertensive crisis 01/20/2021   Hypertensive emergency 12/21/2020   NSTEMI (non-ST elevated myocardial infarction) ([place]) 12/21/2015   Polysubstance abuse ([place])     Medications:  PTA meds include Xarelto 20 mg daily.  Last dose unknown.  Checking baseline HL, aPTT, & INR  Assessment: Pt is 41 yo male presenting to ED c/o SOB, found w/ elevated BNP and Troponin I lvls.  Goal of Therapy:  aPTT goal: 66-102 Heparin level 0.3-0.7 units/ml Monitor platelets by  anticoagulation protocol: Yes  Date Time aPTT/HL Rate/Comment 6/16 1221 76/---  Therapeutic x1 6/16 1803 64/---  Subtherapeutic 6/17 0126 75/--  Therapeutic x 1 6/17 0755 81/0.55 Therapeutic.  6/18 0317 64/0.45 aPTT subthera/HL thera 6/18 1504 65/0.48 HL Therapeutic  6/18 2306 0.48  Therapeutic x 2 6/19 0412 0.46  Therapeutic x 3 6/20     0412    0.46                Therapeutic X 4   Plan:  6/20:  HL @ 0412 = 0.46, therapeutic X 4  Will continue pt on current rate and recheck HL on 6/21 with AM labs.   Wilford Merryfield D 02/04/2022 4:48 AM

## 2022-02-04 NOTE — Progress Notes (Signed)
[place], [place] 02/04/22  Subjective:   Hospital day # 5  Patient remains critically ill. Neuro: Remains sedated with propofol and fentanyl, failed attempts to wean sedation yesterday.  cvs: Weaned off neo yesterday. pulm: Ventilator assisted.  FiO2 40% with 10 of PEEP.  gi: Tube feeds - 60 cc/h. Renal: Foley in place -74ml  CRRT held due to malfunctioning HD catheter.  06/19 0701 - 06/20 0700 In: 3065.4 [I.V.:1235.1; JO/IN:8676; IV Piggyback:188.3] Out: 3451.2 [Urine:36] Lab Results  Component Value Date   CREATININE 4.26 (H) 02/04/2022   CREATININE 3.36 (H) 02/03/2022   CREATININE 3.44 (H) 02/03/2022     Objective:  Vital signs in last 24 hours:  Temp:  [98.4 F (36.9 C)-100 F (37.8 C)] 98.4 F (36.9 C) (06/20 1015) Pulse Rate:  [26-158] 78 (06/20 1015) Resp:  [16-25] 21 (06/20 1015) SpO2:  [93 %-100 %] 98 % (06/20 1015) Arterial Line BP: (91-174)/(55-107) 123/73 (06/20 1015) FiO2 (%):  [40 %] 40 % (06/20 0755) Weight:  [144.8 kg] 144.8 kg (06/20 0500)  Weight change: 2.8 kg Filed Weights   02/02/22 0328 02/03/22 0422 02/04/22 0500  Weight: 133.4 kg (!) 142 kg (!) 144.8 kg    Intake/Output:    Intake/Output Summary (Last 24 hours) at 02/04/2022 1139 Last data filed at 02/04/2022 1000 Gross per 24 hour  Intake 2646.22 ml  Output 2575.2 ml  Net 71.02 ml      Physical Exam: General: Critically ill-appearing, laying in the bed  HEENT ET tube, OG tube in place  Pulm/lungs Ventilator assisted  CVS/Heart Atrial fibrillation, irregular  Abdomen:  Soft  Extremities: Some dependent edema present  Neurologic: Sedated  Skin: Warm, dry  Access: Right femoral dialysis catheter in place       Basic Metabolic Panel:  Recent Labs  Lab 01/31/22 0505 01/31/22 0802 02/01/22 0321 02/02/22 0317 02/02/22 2012 02/03/22 0412 02/03/22 1535 02/04/22 0412  NA 137   < > 136 135 135 135 135 137  K 3.5   < > 4.4 3.7 3.1* 3.6 3.8  3.8  CL 106   < > 104 104 104 104 103 106  CO2 21*   < > 20* 19* 23 24 23 23   GLUCOSE 133*   < > 97 123* 108* 107* 124* 113*  BUN 31*   < > 45* 56* 37* 34* 37* 44*  CREATININE 2.44*   < > 4.77* 5.14* 3.55* 3.44* 3.36* 4.26*  CALCIUM 8.8*   < > 8.6* 8.4* 9.1 8.5* 8.8* 8.3*  MG 2.1  --  2.2 2.1  --  2.2  --  2.4  PHOS  --   --   --  6.8* 4.2 3.8 4.5 5.1*   < > = values in this interval not displayed.      CBC: Recent Labs  Lab 01/21/2022 0638 01/31/22 0505 01/31/22 0802 02/01/22 0321 02/02/22 0317 02/03/22 0412 02/04/22 0412  WBC 9.7   < > 12.7* 11.8* 9.8 6.7 6.6  NEUTROABS 7.6  --  10.3* 8.8* 6.9 4.8  --   HGB 11.3*   < > 10.6* 10.3* 10.6* 11.1* 10.5*  HCT 37.2*   < > 35.3* 35.1* 35.2* 36.6* 35.3*  MCV 67.1*   < > 68.7* 69.8* 68.8* 66.8* 68.0*  PLT 229   < > 218 195 214 188 198   < > = values in this interval not displayed.       Lab Results  Component Value Date  HEPBSAG NON REACTIVE 02/02/2022   HEPBIGM NON REACTIVE 02/02/2022      Microbiology:  Recent Results (from the past 240 hour(s))  Blood culture (routine x 2)     Status: None   Collection Time: 02/06/2022  1:17 AM   Specimen: BLOOD  Result Value Ref Range Status   Specimen Description BLOOD LEFT HAND  Final   Special Requests   Final    BOTTLES DRAWN AEROBIC AND ANAEROBIC Blood Culture adequate volume   Culture   Final    NO GROWTH 5 DAYS Performed at [facility], [address] [place], [place] [address]    Report Status 02/04/2022 FINAL  Final  Resp Panel by RT-PCR (Flu A&B, Covid) Anterior Nasal Swab     Status: None   Collection Time: 01/17/2022  1:17 AM   Specimen: Anterior Nasal Swab  Result Value Ref Range Status   SARS Coronavirus 2 by RT PCR NEGATIVE NEGATIVE Final    Comment: (NOTE) SARS-CoV-2 target nucleic acids are NOT DETECTED.  The SARS-CoV-2 RNA is generally detectable in upper respiratory specimens during the acute phase of infection. The lowest concentration of  SARS-CoV-2 viral copies this assay can detect is 138 copies/mL. A negative result does not preclude SARS-Cov-2 infection and should not be used as the sole basis for treatment or other patient management decisions. A negative result may occur with  improper specimen collection/handling, submission of specimen other than nasopharyngeal swab, presence of viral mutation(s) within the areas targeted by this assay, and inadequate number of viral copies(<138 copies/mL). A negative result must be combined with clinical observations, patient history, and epidemiological information. The expected result is Negative.  Fact Sheet for Patients:  EntrepreneurPulse.com.au  Fact Sheet for Healthcare Providers:  IncredibleEmployment.be  This test is no t yet approved or cleared by the [place] FDA and  has been authorized for detection and/or diagnosis of SARS-CoV-2 by FDA under an Emergency Use Authorization (EUA). This EUA will remain  in effect (meaning this test can be used) for the duration of the COVID-19 declaration under Section 564(b)(1) of the Act, 21 U.S.C.section 360bbb-3(b)(1), unless the authorization is terminated  or revoked sooner.       Influenza A by PCR NEGATIVE NEGATIVE Final   Influenza B by PCR NEGATIVE NEGATIVE Final    Comment: (NOTE) The Xpert Xpress SARS-CoV-2/FLU/RSV plus assay is intended as an aid in the diagnosis of influenza from Nasopharyngeal swab specimens and should not be used as a sole basis for treatment. Nasal washings and aspirates are unacceptable for Xpert Xpress SARS-CoV-2/FLU/RSV testing.  Fact Sheet for Patients: EntrepreneurPulse.com.au  Fact Sheet for Healthcare Providers: IncredibleEmployment.be  This test is not yet approved or cleared by the [place] FDA and has been authorized for detection and/or diagnosis of SARS-CoV-2 by FDA under an Emergency Use  Authorization (EUA). This EUA will remain in effect (meaning this test can be used) for the duration of the COVID-19 declaration under Section 564(b)(1) of the Act, 21 U.S.C. section 360bbb-3(b)(1), unless the authorization is terminated or revoked.  Performed at [facility], [address] [place], [place] [address]   Blood culture (routine x 2)     Status: None   Collection Time: 02/06/2022  1:18 AM   Specimen: BLOOD  Result Value Ref Range Status   Specimen Description BLOOD LEFT ASSIST CONTROL  Final   Special Requests   Final    BOTTLES DRAWN AEROBIC AND ANAEROBIC Blood Culture adequate volume   Culture  Final    NO GROWTH 5 DAYS Performed at [facility], [address] [place], [place] [address]    Report Status 02/04/2022 FINAL  Final  MRSA Next Gen by PCR, Nasal     Status: None   Collection Time: 02/13/2022  3:01 AM   Specimen: Nasal Mucosa; Nasal Swab  Result Value Ref Range Status   MRSA by PCR Next Gen NOT DETECTED NOT DETECTED Final    Comment: (NOTE) The GeneXpert MRSA Assay (FDA approved for NASAL specimens only), is one component of a comprehensive MRSA colonization surveillance program. It is not intended to diagnose MRSA infection nor to guide or monitor treatment for MRSA infections. Test performance is not FDA approved in patients less than 22 years old. Performed at [facility], [address] [place], [place] [address]   Expectorated Sputum Assessment w Gram Stain, Rflx to Resp Cult     Status: None   Collection Time: 02/08/2022  6:07 AM   Specimen: Expectorated Sputum  Result Value Ref Range Status   Specimen Description EXPECTORATED SPUTUM  Final   Special Requests NONE  Final   Sputum evaluation   Final    THIS SPECIMEN IS ACCEPTABLE FOR SPUTUM CULTURE Performed at [facility], [address], [place], [place] [address]    Report Status 02/11/2022 FINAL  Final  Culture, Respiratory w Gram Stain      Status: None   Collection Time: 02/13/2022  6:07 AM  Result Value Ref Range Status   Specimen Description   Final    EXPECTORATED SPUTUM Performed at [facility], [address] [place], [place] [address]    Special Requests   Final    NONE Reflexed from [id number] Performed at [facility], [address] [place], [place] [address]    Gram Stain   Final    RARE WBC PRESENT, PREDOMINANTLY PMN RARE GRAM POSITIVE COCCI IN PAIRS    Culture   Final    FEW Normal respiratory flora-no Staph aureus or Pseudomonas seen Performed at [facility], [place] [address] [place], [place] [address]    Report Status 02/01/2022 FINAL  Final    Coagulation Studies: No results for input(s): "LABPROT", "INR" in the last 72 hours.   Urinalysis: No results for input(s): "COLORURINE", "LABSPEC", "PHURINE", "GLUCOSEU", "HGBUR", "BILIRUBINUR", "KETONESUR", "PROTEINUR", "UROBILINOGEN", "NITRITE", "LEUKOCYTESUR" in the last 72 hours.  Invalid input(s): "APPERANCEUR"     Imaging: DG Chest 1 View  Result Date: 02/04/2022 CLINICAL DATA:  Catheter insertion. EXAM: PORTABLE CHEST 1 VIEW COMPARISON:  Chest XR, 02/03/2022.  CT chest, 07/07/2021. FINDINGS: Support lines: ETT with catheter tip unchanged and within the midthoracic trachea. New LEFT IJ non tunneled dialysis catheter, with tip at the LEFT brachiocephalic vein/SVC junction. Large bore enteric tube with tip excluded from view. Overlying cutaneous leads. Cardiomediastinal silhouette is enlarged and unchanged. Similar pulmonary findings with hypoinflation perihilar interstitial thickening, and LEFT basilar opacity. No large pleural effusion or pneumothorax. No interval osseous abnormality. IMPRESSION: 1. New LEFT IJ non tunneled dialysis catheter, with tip at the brachiocephalic vein/SVC junction. Additional lines and tubes as above. 2. Unchanged pulmonary findings.  No pneumothorax. Electronically Signed   By: Michaelle Birks M.D.   On:  02/04/2022 08:40   DG Chest Port 1 View  Result Date: 02/03/2022 CLINICAL DATA:  Endotracheal tube. EXAM: PORTABLE CHEST 1 VIEW COMPARISON:  February 02, 2022. FINDINGS: Stable cardiomegaly. Endotracheal and nasogastric tubes are unchanged in position. Stable right lung  opacity is noted with probable left retrocardiac opacity is well. Bony thorax is unremarkable. IMPRESSION: Stable support apparatus. Stable bilateral lung opacities are noted as described above. Electronically Signed   By: Marijo Conception M.D.   On: 02/03/2022 08:02   US Abdomen Limited RUQ (LIVER/GB)  Result Date: 02/02/2022 CLINICAL DATA:  Elevated liver enzymes. EXAM: ULTRASOUND ABDOMEN LIMITED RIGHT UPPER QUADRANT COMPARISON:  None Available. FINDINGS: Gallbladder: No gallstones or wall thickening visualized. No sonographic Murphy sign noted by sonographer. Common bile duct: Diameter: 4 mm Liver: There is diffuse increased liver echogenicity most commonly seen in the setting of fatty infiltration. Superimposed inflammation or fibrosis is not excluded. Clinical correlation is recommended. Portal vein is patent on color Doppler imaging with normal direction of blood flow towards the liver. Other: None. IMPRESSION: Fatty liver, otherwise unremarkable right upper quadrant ultrasound. Electronically Signed   By: Anner Crete M.D.   On: 02/02/2022 20:51     Medications:    sodium chloride Stopped (02/02/22 2010)   feeding supplement (VITAL HIGH PROTEIN) 60 mL/hr at 02/04/22 1000   fentaNYL infusion INTRAVENOUS 100 mcg/hr (02/04/22 1022)   heparin Stopped (02/04/22 0650)   phenylephrine (NEO-SYNEPHRINE) Adult infusion Stopped (02/03/22 1208)   propofol (DIPRIVAN) infusion 35 mcg/kg/min (02/04/22 1022)    alteplase  2 mg Intracatheter Once   budesonide (PULMICORT) nebulizer solution  0.5 mg Nebulization BID   Chlorhexidine Gluconate Cloth  6 each Topical Q0600   docusate  100 mg Per Tube BID   feeding supplement (PROSource TF)  45  mL Per Tube TID   free water  30 mL Per Tube Q4H   insulin aspart  0-6 Units Subcutaneous Q4H   ipratropium-albuterol  3 mL Nebulization Q4H   lidocaine  1 patch Transdermal Q24H   multivitamin  1 tablet Per Tube QHS   mouth rinse  15 mL Mouth Rinse Q2H   pantoprazole sodium  40 mg Per Tube Daily   polyethylene glycol  17 g Per Tube Daily   sodium chloride, acetaminophen, guaiFENesin-dextromethorphan, heparin, morphine injection, mouth rinse, mouth rinse  Assessment/ Plan:  41 y.o. male with   chronic systolic and diastolic CHF, hypertension, anemia, polysubstance abuse (cocaine and tobacco), chronic kidney disease, non-STEMI May 2023, A-fib with RVR admitted on 01/24/2022 for Acute respiratory failure ([name]) [J96.00] Acute respiratory failure with hypoxia ([name]) [J96.01] Atrial fibrillation with RVR ([place]) [I48.91] Community acquired pneumonia of right lung, unspecified part of lung [J18.9] Acute on chronic congestive heart failure, unspecified heart failure type ([place]) [I50.9] Acute respiratory failure with hypoxemia ([name]) [J96.01]  Acute kidney injury on chronic kidney disease stage IIIb. Baseline creatinine 2.03/GFR 42 on November 04, 2021. Renal ultrasound without obstruction.  No IV contrast exposure.AKI likely secondary to cardiorenal syndrome.  Started on CRRT February 01, 2022.  CRRT stopped during the night due to malfunctioning catheter.  Patient weaned off pressors and stable.  Will not restart CRRT at this time, plans to perform IHD tomorrow.  Patient remains oliguric with increased creatinine.  Acute respiratory failure Currently ventilator assisted.   Getting IV cefepime, linezolid, metronidazole for community-acquired pneumonia  Acute exacerbation of chronic systolic and diastolic CHF 2D echo September 05, 2021-severe global reduction in LV function, severe LVH, EF 20 to 25%, global hypokinesis, grade 3 diastolic dysfunction     LOS: [place] 6/20/202311:39  [place], [place]

## 2022-02-04 NOTE — Procedures (Addendum)
Central Venous Catheter Insertion Procedure Note  Gerald [name]  [id number]  [birth year]  Date:02/04/22  Time:3:58 AM   Provider Performing:Gerald [name]   Procedure: Re-Insertion of Non-tunneled Central Venous Catheter(36556)with US guidance (29798)   called bedside as HD catheter alarming high pressures, CRRT not working. Catheter appears positional, as if it is kinking internally. Will re-line with 20 cm catheter and see if that resolves the issue.  Indication(s) Hemodialysis  Consent Unable to obtain consent due to emergent nature of procedure.  Anesthesia Topical only with 1% lidocaine , fentanyl & propofol drips  Timeout Verified patient identification, verified procedure, site/side was marked, verified correct patient position, special equipment/implants available, medications/allergies/relevant history reviewed, required imaging and test results available.  Sterile Technique Maximal sterile technique including full sterile barrier drape, hand hygiene, sterile gown, sterile gloves, mask, hair covering, sterile ultrasound probe cover (if used).  Procedure Description Area of catheter insertion was cleaned with chlorhexidine and draped in sterile fashion.   With real-time ultrasound guidance a HD catheter was placed into the right femoral vein.  Nonpulsatile blood flow and easy flushing noted in all ports.  The catheter was sutured in place and sterile dressing applied.  Complications/Tolerance None; patient tolerated the procedure well. Chest X-ray is ordered to verify placement for internal jugular or subclavian cannulation.  Chest x-ray is not ordered for femoral cannulation.  EBL Minimal  Specimen(s) None  Gerald [name], AGACNP-BC Acute Care Nurse Practitioner [facility]   [phone] / [phone] Please see Amion for pager details.

## 2022-02-04 NOTE — TOC Progression Note (Signed)
Transition of Care [facility]) - Progression Note    Patient Details  Name: Gerald [name] MRN: [id number] Date of Birth: 01-Feb-1981  [facility] CM/SW Contact  Shelbie Hutching, RN Phone Number: 02/04/2022, 11:22 AM  Clinical Narrative:    Patient intubated on 6/16 currently in the ICU on the ventilator, sedated.  Patient is off Continuous dialysis and they will do hemodialysis tomorrow.  TOC following hospital course.    Expected Discharge Plan: Home/Self Care Barriers to Discharge: Continued Medical Work up  Expected Discharge Plan and Services Expected Discharge Plan: Home/Self Care       Living arrangements for the past 2 months: Single Family Home                                       Social Determinants of Health (SDOH) Interventions    Readmission Risk Interventions    09/09/2021    1:33 PM 06/03/2021    1:55 PM 04/02/2021    1:25 PM  Readmission Risk Prevention Plan  Transportation Screening Complete Complete Complete  Medication Review Press photographer) Complete Complete Complete  PCP or Specialist appointment within 3-5 days of discharge Complete Complete Complete  HRI or Home Care Consult Complete Complete Complete  SW Recovery Care/Counseling Consult Complete Complete Complete  Palliative Care Screening Not Applicable Not Applicable Not [place] Not Applicable Not Applicable Not Applicable

## 2022-02-04 NOTE — Procedures (Addendum)
Central Venous Catheter Insertion Procedure Note  Gerald [name]  [id number]  1980/12/21  Date:02/04/22  Time:6:26 AM   Provider Performing:Arafat Cocuzza L [place]  Patient's replaced HD catheter still not working with CRRT. New HD catheter placed.  Procedure: Insertion of Non-tunneled Central Venous Catheter(36556)with US guidance (97353)    Indication(s) Hemodialysis  Consent Unable to obtain consent due to emergent nature of procedure.  Anesthesia Topical only with 1% lidocaine , fentanyl & propofol  Timeout Verified patient identification, verified procedure, site/side was marked, verified correct patient position, special equipment/implants available, medications/allergies/relevant history reviewed, required imaging and test results available.  Sterile Technique Maximal sterile technique including full sterile barrier drape, hand hygiene, sterile gown, sterile gloves, mask, hair covering, sterile ultrasound probe cover (if used).  Procedure Description Area of catheter insertion was cleaned with chlorhexidine and draped in sterile fashion.   With real-time ultrasound guidance a HD catheter was placed into the left femoral vein.  Nonpulsatile blood flow and easy flushing noted in all ports.  The catheter was sutured in place and sterile dressing applied.  Complications/Tolerance None; patient tolerated the procedure well. Chest X-ray is ordered to verify placement for internal jugular or subclavian cannulation.  Chest x-ray is not ordered for femoral cannulation.  EBL Minimal  Specimen(s) None   [name], AGACNP-BC Acute Care Nurse Practitioner [facility]   [phone] / [phone] Please see Amion for pager details.

## 2022-02-04 NOTE — Progress Notes (Signed)
Nutrition Brief Follow Up Note   Propofol change   INTERVENTION:   Decrease Vital HP to 70ml/hr continuous + Pro-Source 30ml TID via tube  Propofol: 31.6 ml/hr- provides 834kcal/day   Free water flushes 29ml q4 hours to maintain tube patency   Regimen provides 2034kcal/day, 150g/day protein and 966ml/day of water   Rena-vit daily via tube   Estimated Nutritional Needs:   Kcal:  1446-1841kcal/day  Protein:  >160g/day  Fluid:  2.5-2.8L/day  Koleen Distance MS, RD, LDN Please refer to [facility] for RD and/or RD on-call/weekend/after hours pager

## 2022-02-05 LAB — RENAL FUNCTION PANEL
Albumin: 2.4 g/dL — ABNORMAL LOW (ref 3.5–5.0)
Albumin: 2.7 g/dL — ABNORMAL LOW (ref 3.5–5.0)
Anion gap: 12 (ref 5–15)
Anion gap: 11 (ref 5–15)
BUN: 72 mg/dL — ABNORMAL HIGH (ref 6–20)
BUN: 65 mg/dL — ABNORMAL HIGH (ref 6–20)
CO2: 21 mmol/L — ABNORMAL LOW (ref 22–32)
CO2: 21 mmol/L — ABNORMAL LOW (ref 22–32)
Calcium: 8.8 mg/dL — ABNORMAL LOW (ref 8.9–10.3)
Calcium: 8.8 mg/dL — ABNORMAL LOW (ref 8.9–10.3)
Chloride: 103 mmol/L (ref 98–111)
Chloride: 102 mmol/L (ref 98–111)
Creatinine, Ser: 6.69 mg/dL — ABNORMAL HIGH (ref 0.61–1.24)
Creatinine, Ser: 6.11 mg/dL — ABNORMAL HIGH (ref 0.61–1.24)
GFR, Estimated: 10 mL/min — ABNORMAL LOW (ref >60)
GFR, Estimated: 11 mL/min — ABNORMAL LOW (ref >60)
Glucose, Bld: 112 mg/dL — ABNORMAL HIGH (ref 70–99)
Glucose, Bld: 133 mg/dL — ABNORMAL HIGH (ref 70–99)
Phosphorus: 7.4 mg/dL — ABNORMAL HIGH (ref 2.5–4.6)
Phosphorus: 5.9 mg/dL — ABNORMAL HIGH (ref 2.5–4.6)
Potassium: 3.9 mmol/L (ref 3.5–5.1)
Potassium: 3.7 mmol/L (ref 3.5–5.1)
Sodium: 136 mmol/L (ref 135–145)
Sodium: 134 mmol/L — ABNORMAL LOW (ref 135–145)

## 2022-02-05 LAB — HEPATITIS B SURFACE ANTIBODY, QUANTITATIVE: Hep B S AB Quant (Post): <3.1 m[IU]/mL — ABNORMAL LOW (ref >9.9)

## 2022-02-05 LAB — CBC
HCT: 32.7 % — ABNORMAL LOW (ref 39.0–52.0)
Hemoglobin: 9.9 g/dL — ABNORMAL LOW (ref 13.0–17.0)
MCH: 20.2 pg — ABNORMAL LOW (ref 26.0–34.0)
MCHC: 30.3 g/dL (ref 30.0–36.0)
MCV: 66.9 fL — ABNORMAL LOW (ref 80.0–100.0)
Platelets: 177 10*3/uL (ref 150–400)
RBC: 4.89 MIL/uL (ref 4.22–5.81)
RDW: 18.3 % — ABNORMAL HIGH (ref 11.5–15.5)
WBC: 7.4 10*3/uL (ref 4.0–10.5)
nRBC: 1.2 % — ABNORMAL HIGH (ref 0.0–0.2)

## 2022-02-05 LAB — GLUCOSE, CAPILLARY
Glucose-Capillary: 117 mg/dL — ABNORMAL HIGH (ref 70–99)
Glucose-Capillary: 112 mg/dL — ABNORMAL HIGH (ref 70–99)
Glucose-Capillary: 122 mg/dL — ABNORMAL HIGH (ref 70–99)
Glucose-Capillary: 126 mg/dL — ABNORMAL HIGH (ref 70–99)
Glucose-Capillary: 124 mg/dL — ABNORMAL HIGH (ref 70–99)
Glucose-Capillary: 126 mg/dL — ABNORMAL HIGH (ref 70–99)

## 2022-02-05 LAB — PHOSPHORUS: Phosphorus: 7.6 mg/dL — ABNORMAL HIGH (ref 2.5–4.6)

## 2022-02-05 LAB — HEPARIN LEVEL (UNFRACTIONATED): Heparin Unfractionated: 0.37 IU/mL (ref 0.30–0.70)

## 2022-02-05 MED ORDER — HEPARIN SODIUM (PORCINE) 1000 UNIT/ML DIALYSIS
1000.0000 [IU] | INTRAMUSCULAR | Status: DC | PRN
  Administered 2022-02-10: 4200 [IU] via INTRAVENOUS_CENTRAL
  Administered 2022-02-12: 1000 [IU] via INTRAVENOUS_CENTRAL

## 2022-02-05 MED ORDER — ALTEPLASE 2 MG IJ SOLR
2.0000 mg | Freq: Once | INTRAMUSCULAR | Status: AC | PRN
  Administered 2022-02-06: 2 mg
  Filled 2022-02-05: qty 2

## 2022-02-05 MED ORDER — DEXMEDETOMIDINE HCL IN NACL 400 MCG/100ML IV SOLN
0.4000 ug/kg/h | INTRAVENOUS | Status: DC
  Administered 2022-02-05: 0.8 ug/kg/h via INTRAVENOUS
  Administered 2022-02-05: 0.6 ug/kg/h via INTRAVENOUS
  Administered 2022-02-05: 1.1 ug/kg/h via INTRAVENOUS
  Administered 2022-02-05: 1.2 ug/kg/h via INTRAVENOUS
  Administered 2022-02-05: 0.4 ug/kg/h via INTRAVENOUS
  Administered 2022-02-06 (×4): 1 ug/kg/h via INTRAVENOUS
  Administered 2022-02-06: 1.1 ug/kg/h via INTRAVENOUS
  Administered 2022-02-06 (×3): 1 ug/kg/h via INTRAVENOUS
  Administered 2022-02-07: 1.2 ug/kg/h via INTRAVENOUS
  Administered 2022-02-07 (×2): 1 ug/kg/h via INTRAVENOUS
  Administered 2022-02-07: 1.2 ug/kg/h via INTRAVENOUS
  Administered 2022-02-07: 0.8 ug/kg/h via INTRAVENOUS
  Administered 2022-02-07: 1 ug/kg/h via INTRAVENOUS
  Administered 2022-02-07: 1.2 ug/kg/h via INTRAVENOUS
  Administered 2022-02-08 (×6): 1 ug/kg/h via INTRAVENOUS
  Administered 2022-02-08: 0.8 ug/kg/h via INTRAVENOUS
  Filled 2022-02-05 (×29): qty 100

## 2022-02-05 MED ORDER — OXYCODONE HCL 5 MG/5ML PO SOLN
5.0000 mg | Freq: Three times a day (TID) | ORAL | Status: DC
  Administered 2022-02-05 – 2022-02-07 (×7): 5 mg
  Filled 2022-02-05 (×7): qty 5

## 2022-02-05 MED ORDER — CLONAZEPAM 0.5 MG PO TABS
0.5000 mg | ORAL_TABLET | Freq: Two times a day (BID) | ORAL | Status: DC
  Administered 2022-02-05 – 2022-02-08 (×7): 0.5 mg
  Filled 2022-02-05 (×7): qty 1

## 2022-02-05 MED ORDER — HEPARIN (PORCINE) 25000 UT/250ML-% IV SOLN
2650.0000 [IU]/h | INTRAVENOUS | Status: DC
  Administered 2022-02-05 – 2022-02-08 (×8): 2100 [IU]/h via INTRAVENOUS
  Administered 2022-02-10 – 2022-02-11 (×5): 2300 [IU]/h via INTRAVENOUS
  Administered 2022-02-12 – 2022-02-13 (×3): 2500 [IU]/h via INTRAVENOUS
  Filled 2022-02-05 (×16): qty 250

## 2022-02-05 MED ORDER — PENTAFLUOROPROP-TETRAFLUOROETH EX AERO: 1.0000 | INHALATION_SPRAY | CUTANEOUS | Status: DC | PRN

## 2022-02-05 MED ORDER — HEPARIN SODIUM (PORCINE) 1000 UNIT/ML IJ SOLN
INTRAMUSCULAR | Status: AC
  Filled 2022-02-05: qty 10

## 2022-02-05 MED ORDER — QUETIAPINE FUMARATE 25 MG PO TABS
25.0000 mg | ORAL_TABLET | Freq: Every day | ORAL | Status: DC
  Administered 2022-02-05 – 2022-02-06 (×2): 25 mg
  Filled 2022-02-05 (×2): qty 1

## 2022-02-05 MED ORDER — LIDOCAINE HCL (PF) 1 % IJ SOLN: 5.0000 mL | INTRAMUSCULAR | Status: DC | PRN

## 2022-02-05 MED ORDER — LIDOCAINE-PRILOCAINE 2.5-2.5 % EX CREA: 1.0000 | TOPICAL_CREAM | CUTANEOUS | Status: DC | PRN

## 2022-02-05 NOTE — Progress Notes (Signed)
Updated pt's mother at bedside.  All questions answered to her satisfaction.   Darel Hong, AGACNP-BC [facility] Prefer epic messenger for cross cover needs If after hours, please call E-link

## 2022-02-05 NOTE — Progress Notes (Signed)
Hemodialysis Post Treatment Note   Date: 02/05/22   Access: LIJ CVC   UF Removed:  1501 ml   Next Scheduled Treatment: 02/06/22   Note: Patient started treatment as ordered. Patient tolerated treatment with no adverse effects. Patient hemodynamically stable, report given [place] R. Larene Beach, RN.

## 2022-02-05 NOTE — Progress Notes (Signed)
0730 patient sedated  on vent unable to make needs known  0830 plan to decrease sedation and place on other medications for HD later and possible vent wean 1130 patient awake unable to follow commands HR, BP, RESP all elevated patient diaphoretic  sitting up in bed biting ETT CC team at bedside all sedation restarted plan HD asap and re-eval weaning process at that time new orders given see mar. 1400 HD at bedside mother of patient at bedside plan of care reviewed with patient Mother "Mardene Celeste".

## 2022-02-05 NOTE — Progress Notes (Signed)
Nutrition Brief Follow Up Note   Propofol change   INTERVENTION:   Continue Vital HP to 70ml/hr continuous + Pro-Source 61ml TID via tube  Propofol: 23.7 ml/hr- provides 625kcal/day   Free water flushes 88ml q4 hours to maintain tube patency   Regimen provides 1825kcal/day, 150g/day protein and 969ml/day of water   Rena-vit daily via tube   Estimated Nutritional Needs:   Kcal:  1446-1841kcal/day  Protein:  >160g/day  Fluid:  2.5-2.8L/day  Koleen Distance MS, RD, LDN Please refer to [facility] for RD and/or RD on-call/weekend/after hours pager

## 2022-02-05 NOTE — Progress Notes (Signed)
ANTICOAGULATION CONSULT NOTE   Pharmacy Consult for Heparin Infusion Indication: ACS/STEMI/atrial fibrillation  Patient Measurements: Height: 6' (182.9 cm) Weight: (!) 141.8 kg (312 lb 9.8 oz) IBW/kg (Calculated) : 77.6 Heparin Dosing Weight: 109.9 kg  Labs: Recent Labs    02/02/22 1504 02/02/22 2012 02/02/22 2306 02/03/22 0412 02/03/22 1535 02/04/22 0412 02/04/22 2056 02/05/22 0335  HGB  --    < >  --  11.1*  --  10.5*  --  9.9*  HCT  --   --   --  36.6*  --  35.3*  --  32.7*  PLT  --   --   --  188  --  198  --  177  APTT 65*  --   --   --   --   --   --   --   HEPARINUNFRC 0.48  --  0.48 0.46  --  0.58  --   --   CREATININE  --    < >  --  3.44* 3.36* 4.26* 6.19*  --    < > = values in this interval not displayed.     Estimated Creatinine Clearance: 22.9 mL/min (A) (by C-G formula based on SCr of 6.19 mg/dL (H)).   Medical History: Past Medical History:  Diagnosis Date   Acute on chronic combined systolic and diastolic CHF (congestive heart failure) ([name])    Acute on chronic systolic CHF (congestive heart failure) ([place]) 62/94/7654   Acute systolic congestive heart failure ([name])    Atrial flutter, paroxysmal ([name])    CHF (congestive heart failure) ([place]) 03/30/2021   Chronic combined systolic and diastolic CHF (congestive heart failure) ([name])    Chronic kidney disease, stage III (moderate) ([name])    Dysrhythmia    brief episode afib, cardioverted did not return   Hypertension    Hypertensive crisis 01/20/2021   Hypertensive emergency 12/21/2020   NSTEMI (non-ST elevated myocardial infarction) ([place]) 12/21/2015   Polysubstance abuse ([place])     Medications:  PTA meds include Xarelto 20 mg daily.  Last dose unknown.    Assessment: 41 y/o male with h/o Afib, CKD, HTN, substance abuse, CAD, NSTEMI and CHF who is admitted with PNA, sepsis, CHF and PEA arrest. Patient with bleeding from femoral site following removal of trialysis catheter ~heparin drip held and PAD device in  place ~bleeding currently appears to have subsided and heparin is now being restarted  Goal of Therapy:  Heparin level 0.3-0.7 units/ml Monitor platelets by anticoagulation protocol: Yes   Plan:  Restart heparin at 2100 units/hr (no bolus given recent bleeding)  Check heparin level in 8 hours after restarting CBC daily while on IV heparin  Dallie Piles 02/05/2022 8:12 AM

## 2022-02-05 NOTE — Progress Notes (Signed)
   02/05/22 1500  Clinical Encounter Type  Visited With Patient and family together  Visit Type Initial  Referral From Nurse  Consult/Referral To Chaplain   Chaplain provided compassionate presence and reflective listening for mother of patient.

## 2022-02-05 NOTE — Plan of Care (Signed)
  Problem: Education: Goal: Knowledge of General Education information will improve Description: Including pain rating scale, medication(s)/side effects and non-pharmacologic comfort measures Outcome: Not Progressing   Problem: Health Behavior/Discharge Planning: Goal: Ability to manage health-related needs will improve Outcome: Not Progressing   Problem: Clinical Measurements: Goal: Ability to maintain clinical measurements within normal limits will improve Outcome: Not Progressing Goal: Will remain free from infection Outcome: Not Progressing Goal: Diagnostic test results will improve Outcome: Not Progressing Goal: Respiratory complications will improve Outcome: Not Progressing Goal: Cardiovascular complication will be avoided Outcome: Not Progressing   Problem: Activity: Goal: Risk for activity intolerance will decrease Outcome: Not Progressing   Problem: Nutrition: Goal: Adequate nutrition will be maintained Outcome: Not Progressing   Problem: Coping: Goal: Level of anxiety will decrease Outcome: Not Progressing   Problem: Elimination: Goal: Will not experience complications related to bowel motility Outcome: Not Progressing Goal: Will not experience complications related to urinary retention Outcome: Not Progressing   Problem: Pain Managment: Goal: General experience of comfort will improve Outcome: Not Progressing   Problem: Safety: Goal: Ability to remain free from injury will improve Outcome: Not Progressing   Problem: Skin Integrity: Goal: Risk for impaired skin integrity will decrease Outcome: Not Progressing   Problem: Education: Goal: Ability to demonstrate management of disease process will improve Outcome: Not Progressing Goal: Ability to verbalize understanding of medication therapies will improve Outcome: Not Progressing Goal: Individualized Educational Video(s) Outcome: Not Progressing   Problem: Activity: Goal: Capacity to carry out  activities will improve Outcome: Not Progressing   Problem: Cardiac: Goal: Ability to achieve and maintain adequate cardiopulmonary perfusion will improve Outcome: Not Progressing   Problem: Education: Goal: Ability to describe self-care measures that may prevent or decrease complications (Diabetes Survival Skills Education) will improve Outcome: Not Progressing Goal: Individualized Educational Video(s) Outcome: Not Progressing   Problem: Coping: Goal: Ability to adjust to condition or change in health will improve Outcome: Not Progressing   Problem: Fluid Volume: Goal: Ability to maintain a balanced intake and output will improve Outcome: Not Progressing   Problem: Health Behavior/Discharge Planning: Goal: Ability to identify and utilize available resources and services will improve Outcome: Not Progressing Goal: Ability to manage health-related needs will improve Outcome: Not Progressing   Problem: Metabolic: Goal: Ability to maintain appropriate glucose levels will improve Outcome: Not Progressing   Problem: Nutritional: Goal: Maintenance of adequate nutrition will improve Outcome: Not Progressing Goal: Progress toward achieving an optimal weight will improve Outcome: Not Progressing   Problem: Skin Integrity: Goal: Risk for impaired skin integrity will decrease Outcome: Not Progressing   Problem: Tissue Perfusion: Goal: Adequacy of tissue perfusion will improve Outcome: Not Progressing   Problem: Education: Goal: Knowledge of disease and its progression will improve Outcome: Not Progressing Goal: Individualized Educational Video(s) Outcome: Not Progressing   Problem: Fluid Volume: Goal: Compliance with measures to maintain balanced fluid volume will improve Outcome: Not Progressing   Problem: Health Behavior/Discharge Planning: Goal: Ability to manage health-related needs will improve Outcome: Not Progressing   Problem: Nutritional: Goal: Ability to  make healthy dietary choices will improve Outcome: Not Progressing   Problem: Clinical Measurements: Goal: Complications related to the disease process, condition or treatment will be avoided or minimized Outcome: Not Progressing

## 2022-02-05 NOTE — Progress Notes (Signed)
NAME:  Gerald [name], MRN:  [id number], DOB:  May 31, 1981, LOS: 6 ADMISSION DATE:  02/03/2022 CONSULTATION DATE:  02/05/2022  Brief patient description Gerald [name]:  41 yo morbidly obese AAM with acute sCHF exacerbation with acute hypoxic resp failure with pulm edema and renal failure leading to severe resp failure due to severe end stage nonischemic sCHF exacerbation leading to progressive cardiorenal syndrome with renal failure requiring CRRT  History of Present Illness:  41 y.o. male with history of chronic systolic and diastolic CHF last EF measured was 20 to 25% in January 2023 with history of hypertension, chronic kidney disease stage III baseline creatinine around 2,  anemia polysubstance abuse admits to taking cocaine presents to the ER because of worsening shortness of breath for the last 3 days with no associated chest pain has some productive cough.   Patient states he has not taken his medicines for last 3 days.  Admitted to [facility] with SOB    ED Course: In the ER patient was hypoxic initially requiring BiPAP but after placing on BiPAP patient had vomiting and had to be taken off the BiPAP was placed on high flow oxygen.  Patient chest x-ray shows congestion was placed on IV Lasix and also empirically on antibiotics.   BNP 790 high sensitive troponin was 553 and 4 and 72 was started on IV heparin.  Patient admitted for acute hypoxic respiratory failure likely from CHF.  Since patient was in A-fib with RVR initially was started on Cardizem infusion.  Transition to amiodarone.  PCCM consulted to assess worsening SOB and increased WOB today 6/15   Pertinent  Medical History   Active Ambulatory Problems    Diagnosis Date Noted   Community acquired pneumonia of right lung 12/21/2015   Acute respiratory distress 04/01/2019   Acute on chronic combined systolic and diastolic CHF (congestive heart failure) ([name])    Atrial flutter ([name])    Essential hypertension    PAF (paroxysmal atrial  fibrillation) ([place]) 07/05/2020   Elevated troponin 07/05/2020   Microcytic anemia 07/05/2020   Hypokalemia 07/05/2020   Hypertensive emergency 12/21/2020   Stage 3a chronic kidney disease (CKD) ([place]) - baseline SCr 1.8-2.0 12/21/2020   Cocaine abuse ([place]) 01/20/2021   Obesity, Class III, BMI 40-49.9 (morbid obesity) ([place]) 01/20/2021   Acute on chronic congestive heart failure ([place]) 03/30/2021   Restless leg 07/08/2021   Severe uncontrolled hypertension 09/05/2021   Atrial fibrillation with rapid ventricular response ([place]) 11/02/2021   Resolved Ambulatory Problems    Diagnosis Date Noted   NSTEMI (non-ST elevated myocardial infarction) ([place]) 12/21/2015   Persistent atrial fibrillation ([name])    Acute systolic congestive heart failure ([name])    Acute on chronic systolic CHF (congestive heart failure) ([place]) 07/05/2020   Hypertensive urgency 07/05/2020   Hypertensive crisis 26/94/8546   Acute diastolic CHF (congestive heart failure) ([place]) 03/30/2021   Hemoptysis 07/07/2021   Past Medical History:  Diagnosis Date   Atrial flutter, paroxysmal ([name])    CHF (congestive heart failure) ([place]) 03/30/2021   Chronic combined systolic and diastolic CHF (congestive heart failure) ([place])    Chronic kidney disease, stage III (moderate) ([place])    Dysrhythmia    Hypertension    Polysubstance abuse ([place])     Significant Hospital Events: Including procedures, antibiotic start and stop dates in addition to other pertinent events   6/14 admitted for acute sCHF 6/15 PCCM consulted for impending resp failure Feb 12, 2023 patient coded for 1 round with the intubation tried from respiratory  arrest 06/17 patient is anuric dialysis catheter is and nephrology is involved he might need CRRT or HD 06/18 increase in liver enzymes to above 1000 we will hold amiodarone and atorvastatin 6/19 multiorgan failure, on CRRT  6/20: Failed SBT due to delirium and increased work of breathing, tachycardia, diaphoresis.   Transitioned from CRRT to intermittent HD (plan for first HD session 6/21) 6/21: Plan for HD this morning, then will attempt SBT.  Precedex as needed for agitation/delirium with wake up assessment   Micro Data:  6/15: SARS-CoV-2 and influenza PCR>> negative 6/15: Blood culture x2>> no growth 6/15: MRSA PCR>> negative 6/15: Sputum>> normal respiratory flora 6/15: Strep pneumo and Legionella urinary antigens>> negative 6/16: Mycoplasma pneumonia a IgM>> less than 770 6/18: Hepatitis B>> nonreactive   Antimicrobials:   Antibiotics Given (last 72 hours)     Date/Time Action Medication Dose Rate   02/02/22 0912 New Bag/Given   metroNIDAZOLE (FLAGYL) IVPB 500 mg 500 mg 100 mL/hr   02/02/22 1018 New Bag/Given   linezolid (ZYVOX) IVPB 600 mg 600 mg 300 mL/hr   02/02/22 2010 New Bag/Given   metroNIDAZOLE (FLAGYL) IVPB 500 mg 500 mg 100 mL/hr   02/02/22 2113 New Bag/Given   ceFEPIme (MAXIPIME) 2 g in sodium chloride 0.9 % 100 mL IVPB 2 g 200 mL/hr   02/03/22 0859 New Bag/Given   metroNIDAZOLE (FLAGYL) IVPB 500 mg 500 mg 100 mL/hr   02/03/22 1005 New Bag/Given   ceFEPIme (MAXIPIME) 2 g in sodium chloride 0.9 % 100 mL IVPB 2 g 200 mL/hr        Interim History / Subjective:  -No significant events noted overnight -Afebrile, hemodynamically stable, no vasopressors -On minimal vent support -Plan for HD this morning, then will trial SBT -Urine output slightly increased, 190 cc over the past 24 hours -Will start Precedex for SBT given agitation/delirium yesterday with wake up assessment -No bleeding noted since yesterday's event, hemoglobin today at 9.9 from 10.5 -We will resume heparin drip for anticoagulation   Objective   Blood pressure (!) 89/73, pulse 82, temperature 99 F (37.2 C), resp. rate (!) 22, height 6' (1.829 m), weight (!) 141.8 kg, SpO2 97 %.    Vent Mode: PRVC FiO2 (%):  [30 %-40 %] 30 % Set Rate:  [20 bmp] 20 bmp Vt Set:  [550 mL] 550 mL PEEP:  [5 cmH20] 5  cmH20 Plateau Pressure:  [18 cmH20] 18 cmH20   Intake/Output Summary (Last 24 hours) at 02/05/2022 0742 Last data filed at 02/05/2022 0543 Gross per 24 hour  Intake 2315.1 ml  Output 155 ml  Net 2160.1 ml    Filed Weights   02/03/22 0422 02/04/22 0500 02/05/22 0350  Weight: (!) 142 kg (!) 144.8 kg (!) 141.8 kg    REVIEW OF SYSTEMS  PATIENT IS UNABLE TO PROVIDE COMPLETE REVIEW OF SYSTEMS DUE TO SEVERE CRITICAL ILLNESS    PHYSICAL EXAMINATION:  GENERAL: Acute on chronically ill-appearing obese male, laying in bed, intubated and lightly sedated in no acute distress EYES: Pupils equal, round, reactive to light.  No scleral icterus.  MOUTH: Moist mucosal membrane.  Orally INTUBATED NECK: Supple, difficult to assess JVD due to body habitus PULMONARY: Coarse breath sounds bilaterally, no wheezing, occasionally overbreathing the vent CARDIOVASCULAR: Regular rate and rhythm, S1-S2, no murmurs, rubs, gallops GASTROINTESTINAL: Obese, soft, nontender, -distended. Positive bowel sounds.  MUSCULOSKELETAL: No swelling, clubbing, or edema.  NEUROLOGIC: Sedated, withdraws from pain, pupils PERRLA  SKIN:intact,warm,dry     Labs/imaging that I havepersonally  reviewed  (right click and "Reselect all SmartList Selections" daily)      ASSESSMENT AND PLAN   ACUTE Hypoxic  Respiratory Failure in setting of acute decompensated HFrEF -Full vent support, implement lung protective strategies -Plateau pressures less than 30 cm H20 -Wean FiO2 & PEEP as tolerated to maintain O2 sats >92% -Follow intermittent Chest X-ray & ABG as needed -Spontaneous Breathing Trials when respiratory parameters met and mental status permits -Implement VAP Bundle -Bronchodilators & Pulmicort nebs -Volume removal with dialysis  Acute Decompensated Combined Systolic & Diastolic CHF (EF 87%) secondary to NICM Afib with RVR Mildly elevated troponin, suspect demand ischemia Echocardiogram from 09/05/21: LVEF 20 to  86%, grade 3 diastolic dysfunction, RV systolic function normal, pulmonary artery hypertension -Continuous cardiac monitoring -Maintain MAP >65 -Vasopressors as needed to maintain MAP goal ~vasopressors currently weaned off -Lactic acid has normalized -HS Troponin peaked at 553 -Echocardiogram from 09/05/21: LVEF 20 to 76%, grade 3 diastolic dysfunction, RV systolic function normal, pulmonary artery hypertension -Cardiology following, appreciate input -Resume heparin drip for anticoagulation -Due to increased liver enzymes, holding amiodarone and statin  ACUTE KIDNEY INJURY, suspect Cardiorenal Syndrome  -Monitor I&O's / urinary output -Follow BMP -Ensure adequate renal perfusion -Avoid nephrotoxic agents as able -Replace electrolytes as indicated -Nephrology following, appreciate input -HD as per Nephrology  Elevated LFTs, suspect in the setting of congestion with CHF ~improving -Trend LFTs -Continue to hold amiodarone and statins for now -RUQ abdominal ultrasound with fatty liver, otherwise unremarkable  Hyperglycemia -CBG's q4h; Target range of 140 to 180 -SSI -Follow ICU Hypo/Hyperglycemia protocol  ANEMIA without s/sx of overt bleeding -Monitor for S/Sx of bleeding -Trend CBC -Heparin drip for anticoagulation/VTE Prophylaxis  -Transfuse for Hgb <7  Acute Metabolic Encephalopathy Sedation needs in setting of mechanical ventilation PMHx: Polysubstance abuse -Maintain a RASS goal of 0 to -1 -Fentanyl and propofol as needed to maintain RASS goal -Add Precedex as needed for agitation/delirium -Avoid sedating medications as able -Daily wake up assessment -UDS negative      Best practice (right click and "Reselect all SmartList Selections" daily)  Diet: Tube feeds DVT prophylaxis: Systemic AC Mobility:  bed rest  Code Status:  FULL Disposition:ICU  Labs   CBC: Recent Labs  Lab 02/08/2022 0638 01/31/22 0505 01/31/22 0802 02/01/22 0321 02/02/22 0317  02/03/22 0412 02/04/22 0412 02/05/22 0335  WBC 9.7   < > 12.7* 11.8* 9.8 6.7 6.6 7.4  NEUTROABS 7.6  --  10.3* 8.8* 6.9 4.8  --   --   HGB 11.3*   < > 10.6* 10.3* 10.6* 11.1* 10.5* 9.9*  HCT 37.2*   < > 35.3* 35.1* 35.2* 36.6* 35.3* 32.7*  MCV 67.1*   < > 68.7* 69.8* 68.8* 66.8* 68.0* 66.9*  PLT 229   < > 218 195 214 188 198 177   < > = values in this interval not displayed.     Basic Metabolic Panel: Recent Labs  Lab 01/31/22 0505 01/31/22 0802 02/01/22 0321 02/01/22 0321 02/02/22 7209 02/02/22 2012 02/03/22 0412 02/03/22 1535 02/04/22 0412 02/04/22 2056 02/05/22 0335  NA 137   < > 136  --  135 135 135 135 137 136  --   K 3.5   < > 4.4  --  3.7 3.1* 3.6 3.8 3.8 4.1  --   CL 106   < > 104  --  104 104 104 103 106 102  --   CO2 21*   < > 20*  --  19* 23 24  _0 --   GLUCOSE 133*   < > 97  --  123* 108* 107* 124* 113* 112*  --   BUN 31*   < > 45*  --  56* 37* 34* 37* 44* 64*  --   CREATININE 2.44*   < > 4.77*  --  5.14* 3.55* 3.44* 3.36* 4.26* 6.19*  --   CALCIUM 8.8*   < > 8.6*  --  8.4* 9.1 8.5* 8.8* 8.3* 8.8*  --   MG 2.1  --  2.2  --  2.1  --  2.2  --  2.4  --   --   PHOS  --   --   --    < > 6.8* 4.2 3.8 4.5 5.1* 7.3* 7.6*   < > = values in this interval not displayed.    GFR: Estimated Creatinine Clearance: 22.9 mL/min (A) (by C-G formula based on SCr of 6.19 mg/dL (H)). Recent Labs  Lab 01/28/2022 0117 01/16/2022 0153 01/27/2022 0537 01/20/2022 7824 02/02/22 0317 02/03/22 0412 02/04/22 0412 02/05/22 0335  PROCALCITON  --   --  0.19  --   --   --   --   --   WBC  --   --   --    < > 9.8 6.7 6.6 7.4  LATICACIDVEN 1.2 1.0  --   --   --   --   --   --    < > = values in this interval not displayed.     Liver Function Tests: Recent Labs  Lab 01/31/22 0802 02/01/22 0321 02/02/22 0317 02/02/22 2012 02/03/22 0412 02/03/22 1535 02/04/22 0412 02/04/22 2056  AST 21 256* 2,351*  --  905*  --  455*  --   ALT 26 270* 2,221*  --  1,665*  --  1,232*  --    ALKPHOS 64 63 94  --  157*  --  171*  --   BILITOT 0.8 1.2 0.9  --  1.2  --  0.9  --   PROT 6.8 6.4* 6.6  --  6.9  --  7.0  --   ALBUMIN 3.0* 2.6* 2.6* 2.5* 2.6* 2.5* 2.6* 2.5*    No results for input(s): "LIPASE", "AMYLASE" in the last 168 hours. Recent Labs  Lab 01/31/22 0505  AMMONIA 23     ABG    Component Value Date/Time   PHART 7.27 (L) 02/02/2022 0500   PCO2ART 44 02/02/2022 0500   PO2ART 125 (H) 02/02/2022 0500   HCO3 20.2 02/02/2022 0500   ACIDBASEDEF 6.6 (H) 02/02/2022 0500   O2SAT 99.5 02/02/2022 0500     Coagulation Profile: Recent Labs  Lab 02/03/2022 0028  INR 1.5*     Cardiac Enzymes: No results for input(s): "CKTOTAL", "CKMB", "CKMBINDEX", "TROPONINI" in the last 168 hours.  HbA1C: Hgb A1c MFr Bld  Date/Time Value Ref Range Status  09/05/2021 05:00 PM 6.1 (H) 4.8 - 5.6 % Final    Comment:    (NOTE)         Prediabetes: 5.7 - 6.4         Diabetes: >6.4         Glycemic control for adults with diabetes: <7.0   03/30/2021 08:25 PM 6.2 (H) 4.8 - 5.6 % Final    Comment:    (NOTE) Pre diabetes:          5.7%-6.4%  Diabetes:              >  6.4%  Glycemic control for   <7.0% adults with diabetes     CBG: Recent Labs  Lab 02/04/22 1506 02/04/22 1926 02/04/22 2339 02/05/22 0337 02/05/22 0719  GLUCAP 121* 109* 101* 117* 112*       DVT/GI PRX  assessed I Assessed the need for Labs I Assessed the need for Foley I Assessed the need for Central Venous Line Family Discussion when available I Assessed the need for Mobilization I made an Assessment of medications to be adjusted accordingly Safety Risk assessment completed  CASE DISCUSSED IN MULTIDISCIPLINARY ROUNDS WITH ICU TEAM    Critical Care Time: 40 minutes  Darel Hong, AGACNP-BC [facility] Prefer epic messenger for cross cover needs If after hours, please call E-link

## 2022-02-05 NOTE — Progress Notes (Signed)
[place], [place] 02/05/22  Subjective:   Hospital day # 6  Patient remains critically ill. Neuro: Sedation with Precedex fentanyl and propofol, currently weaning  cvs: No pressors pulm: Ventilator assisted.  FiO2 30% with 5 of PEEP.  gi: Tube feeds - 40 cc/h. Renal: Foley in place - 190 mL in 24 hours.   06/20 0701 - 06/21 0700 In: 2471.4 [I.V.:951.1; NG/GT:1520.3] Out: 190 [Urine:190] Lab Results  Component Value Date   CREATININE 6.69 (H) 02/05/2022   CREATININE 6.19 (H) 02/04/2022   CREATININE 4.26 (H) 02/04/2022     Objective:  Vital signs in last 24 hours:  Temp:  [98.6 F (37 C)-99.1 F (37.3 C)] 99 F (37.2 C) (06/21 1000) Pulse Rate:  [42-95] 81 (06/21 1000) Resp:  [16-27] 19 (06/21 1000) SpO2:  [96 %-100 %] 100 % (06/21 1000) Arterial Line BP: (110-141)/(68-93) 113/77 (06/21 1000) FiO2 (%):  [30 %] 30 % (06/21 0802) Weight:  [141.8 kg] 141.8 kg (06/21 0350)  Weight change: -3 kg Filed Weights   02/03/22 0422 02/04/22 0500 02/05/22 0350  Weight: (!) 142 kg (!) 144.8 kg (!) 141.8 kg    Intake/Output:    Intake/Output Summary (Last 24 hours) at 02/05/2022 1101 Last data filed at 02/05/2022 1000 Gross per 24 hour  Intake 2483.13 ml  Output 195 ml  Net 2288.13 ml      Physical Exam: General: Critically ill-appearing, laying in the bed  HEENT ET tube, OG tube in place  Pulm/lungs Ventilator assisted  CVS/Heart Atrial fibrillation, irregular  Abdomen:  Soft  Extremities: Some dependent edema present  Neurologic: Sedated  Skin: Warm, dry  Access: Right femoral dialysis catheter in place       Basic Metabolic Panel:  Recent Labs  Lab 01/31/22 0505 01/31/22 0802 02/01/22 0321 02/02/22 0317 02/02/22 2012 02/03/22 0412 02/03/22 1535 02/04/22 0412 02/04/22 2056 02/05/22 0335  NA 137   < > 136 135   < > 135 135 137 136 136  K 3.5   < > 4.4 3.7   < > 3.6 3.8 3.8 4.1 3.9  CL 106   < > 104 104   < > 104 103  106 102 103  CO2 21*   < > 20* 19*   < > 24 23 23 23  21*  GLUCOSE 133*   < > 97 123*   < > 107* 124* 113* 112* 112*  BUN 31*   < > 45* 56*   < > 34* 37* 44* 64* 72*  CREATININE 2.44*   < > 4.77* 5.14*   < > 3.44* 3.36* 4.26* 6.19* 6.69*  CALCIUM 8.8*   < > 8.6* 8.4*   < > 8.5* 8.8* 8.3* 8.8* 8.8*  MG 2.1  --  2.2 2.1  --  2.2  --  2.4  --   --   PHOS  --   --   --  6.8*   < > 3.8 4.5 5.1* 7.3* 7.4*  7.6*   < > = values in this interval not displayed.      CBC: Recent Labs  Lab 01/19/2022 0638 01/31/22 0505 01/31/22 0802 02/01/22 0321 02/02/22 0317 02/03/22 0412 02/04/22 0412 02/05/22 0335  WBC 9.7   < > 12.7* 11.8* 9.8 6.7 6.6 7.4  NEUTROABS 7.6  --  10.3* 8.8* 6.9 4.8  --   --   HGB 11.3*   < > 10.6* 10.3* 10.6* 11.1* 10.5* 9.9*  HCT 37.2*   < >  35.3* 35.1* 35.2* 36.6* 35.3* 32.7*  MCV 67.1*   < > 68.7* 69.8* 68.8* 66.8* 68.0* 66.9*  PLT 229   < > 218 195 214 188 198 177   < > = values in this interval not displayed.       Lab Results  Component Value Date   HEPBSAG NON REACTIVE 02/04/2022   HEPBSAB NON REACTIVE 02/04/2022   HEPBIGM NON REACTIVE 02/02/2022      Microbiology:  Recent Results (from the past 240 hour(s))  Blood culture (routine x 2)     Status: None   Collection Time: 02/11/2022  1:17 AM   Specimen: BLOOD  Result Value Ref Range Status   Specimen Description BLOOD LEFT HAND  Final   Special Requests   Final    BOTTLES DRAWN AEROBIC AND ANAEROBIC Blood Culture adequate volume   Culture   Final    NO GROWTH 5 DAYS Performed at [facility], [address] [place], [place] [address]    Report Status 02/04/2022 FINAL  Final  Resp Panel by RT-PCR (Flu A&B, Covid) Anterior Nasal Swab     Status: None   Collection Time: 02/11/2022  1:17 AM   Specimen: Anterior Nasal Swab  Result Value Ref Range Status   SARS Coronavirus 2 by RT PCR NEGATIVE NEGATIVE Final    Comment: (NOTE) SARS-CoV-2 target nucleic acids are NOT DETECTED.  The  SARS-CoV-2 RNA is generally detectable in upper respiratory specimens during the acute phase of infection. The lowest concentration of SARS-CoV-2 viral copies this assay can detect is 138 copies/mL. A negative result does not preclude SARS-Cov-2 infection and should not be used as the sole basis for treatment or other patient management decisions. A negative result may occur with  improper specimen collection/handling, submission of specimen other than nasopharyngeal swab, presence of viral mutation(s) within the areas targeted by this assay, and inadequate number of viral copies(<138 copies/mL). A negative result must be combined with clinical observations, patient history, and epidemiological information. The expected result is Negative.  Fact Sheet for Patients:  EntrepreneurPulse.com.au  Fact Sheet for Healthcare Providers:  IncredibleEmployment.be  This test is no t yet approved or cleared by the [place] FDA and  has been authorized for detection and/or diagnosis of SARS-CoV-2 by FDA under an Emergency Use Authorization (EUA). This EUA will remain  in effect (meaning this test can be used) for the duration of the COVID-19 declaration under Section 564(b)(1) of the Act, 21 U.S.C.section 360bbb-3(b)(1), unless the authorization is terminated  or revoked sooner.       Influenza A by PCR NEGATIVE NEGATIVE Final   Influenza B by PCR NEGATIVE NEGATIVE Final    Comment: (NOTE) The Xpert Xpress SARS-CoV-2/FLU/RSV plus assay is intended as an aid in the diagnosis of influenza from Nasopharyngeal swab specimens and should not be used as a sole basis for treatment. Nasal washings and aspirates are unacceptable for Xpert Xpress SARS-CoV-2/FLU/RSV testing.  Fact Sheet for Patients: EntrepreneurPulse.com.au  Fact Sheet for Healthcare Providers: IncredibleEmployment.be  This test is not yet approved or  cleared by the [place] FDA and has been authorized for detection and/or diagnosis of SARS-CoV-2 by FDA under an Emergency Use Authorization (EUA). This EUA will remain in effect (meaning this test can be used) for the duration of the COVID-19 declaration under Section 564(b)(1) of the Act, 21 U.S.C. section 360bbb-3(b)(1), unless the authorization is terminated or revoked.  Performed at [facility], [address], [place], [place] [address]  Blood culture (routine x 2)     Status: None   Collection Time: 01/22/2022  1:18 AM   Specimen: BLOOD  Result Value Ref Range Status   Specimen Description BLOOD LEFT ASSIST CONTROL  Final   Special Requests   Final    BOTTLES DRAWN AEROBIC AND ANAEROBIC Blood Culture adequate volume   Culture   Final    NO GROWTH 5 DAYS Performed at [facility], [address], [place], [place] [address]    Report Status 02/04/2022 FINAL  Final  MRSA Next Gen by PCR, Nasal     Status: None   Collection Time: 02/11/2022  3:01 AM   Specimen: Nasal Mucosa; Nasal Swab  Result Value Ref Range Status   MRSA by PCR Next Gen NOT DETECTED NOT DETECTED Final    Comment: (NOTE) The GeneXpert MRSA Assay (FDA approved for NASAL specimens only), is one component of a comprehensive MRSA colonization surveillance program. It is not intended to diagnose MRSA infection nor to guide or monitor treatment for MRSA infections. Test performance is not FDA approved in patients less than 31 years old. Performed at [facility], [address] [place], [place] [address]   Expectorated Sputum Assessment w Gram Stain, Rflx to Resp Cult     Status: None   Collection Time: 01/23/2022  6:07 AM   Specimen: Expectorated Sputum  Result Value Ref Range Status   Specimen Description EXPECTORATED SPUTUM  Final   Special Requests NONE  Final   Sputum evaluation   Final    THIS SPECIMEN IS ACCEPTABLE FOR SPUTUM CULTURE Performed at [facility],  [address], [place], [place] [address]    Report Status 01/20/2022 FINAL  Final  Culture, Respiratory w Gram Stain     Status: None   Collection Time: 01/25/2022  6:07 AM  Result Value Ref Range Status   Specimen Description   Final    EXPECTORATED SPUTUM Performed at [facility], [address] [place], [place] [address]    Special Requests   Final    NONE Reflexed from [id number] Performed at [facility], [address] [place], [place] [address]    Gram Stain   Final    RARE WBC PRESENT, PREDOMINANTLY PMN RARE GRAM POSITIVE COCCI IN PAIRS    Culture   Final    FEW Normal respiratory flora-no Staph aureus or Pseudomonas seen Performed at [facility], [place] [address] [place],  [address]    Report Status 02/01/2022 FINAL  Final    Coagulation Studies: No results for input(s): "LABPROT", "INR" in the last 72 hours.   Urinalysis: No results for input(s): "COLORURINE", "LABSPEC", "PHURINE", "GLUCOSEU", "HGBUR", "BILIRUBINUR", "KETONESUR", "PROTEINUR", "UROBILINOGEN", "NITRITE", "LEUKOCYTESUR" in the last 72 hours.  Invalid input(s): "APPERANCEUR"     Imaging: DG Chest 1 View  Result Date: 02/04/2022 CLINICAL DATA:  Catheter insertion. EXAM: PORTABLE CHEST 1 VIEW COMPARISON:  Chest XR, 02/03/2022.  CT chest, 07/07/2021. FINDINGS: Support lines: ETT with catheter tip unchanged and within the midthoracic trachea. New LEFT IJ non tunneled dialysis catheter, with tip at the LEFT brachiocephalic vein/SVC junction. Large bore enteric tube with tip excluded from view. Overlying cutaneous leads. Cardiomediastinal silhouette is enlarged and unchanged. Similar pulmonary findings with hypoinflation perihilar interstitial thickening, and LEFT basilar opacity. No large pleural effusion or pneumothorax. No interval osseous abnormality. IMPRESSION: 1. New LEFT IJ non tunneled dialysis catheter, with tip at the brachiocephalic vein/SVC junction. Additional  lines and tubes  as above. 2. Unchanged pulmonary findings.  No pneumothorax. Electronically Signed   By: Michaelle Birks M.D.   On: 02/04/2022 08:40     Medications:    sodium chloride Stopped (02/02/22 2010)   dexmedetomidine (PRECEDEX) IV infusion 0.5 mcg/kg/hr (02/05/22 1000)   feeding supplement (VITAL HIGH PROTEIN) 40 mL/hr at 02/05/22 1000   fentaNYL infusion INTRAVENOUS 50 mcg/hr (02/05/22 1047)   heparin 2,100 Units/hr (02/05/22 1000)   propofol (DIPRIVAN) infusion 10 mcg/kg/min (02/05/22 1048)    alteplase  2 mg Intracatheter Once   budesonide (PULMICORT) nebulizer solution  0.5 mg Nebulization BID   Chlorhexidine Gluconate Cloth  6 each Topical Q0600   docusate  100 mg Per Tube BID   feeding supplement (PROSource TF)  90 mL Per Tube TID   free water  30 mL Per Tube Q4H   heparin sodium (porcine)       insulin aspart  0-6 Units Subcutaneous Q4H   ipratropium-albuterol  3 mL Nebulization Q4H   lidocaine  1 patch Transdermal Q24H   multivitamin  1 tablet Per Tube QHS   multivitamin  15 mL Per Tube Daily   mouth rinse  15 mL Mouth Rinse Q2H   pantoprazole sodium  40 mg Per Tube Daily   polyethylene glycol  17 g Per Tube Daily   sodium chloride, acetaminophen, guaiFENesin-dextromethorphan, heparin, heparin sodium (porcine), mouth rinse, mouth rinse  Assessment/ Plan:  41 y.o. male with   chronic systolic and diastolic CHF, hypertension, anemia, polysubstance abuse (cocaine and tobacco), chronic kidney disease, non-STEMI May 2023, A-fib with RVR admitted on 02/10/2022 for Acute respiratory failure ([name]) [J96.00] Acute respiratory failure with hypoxia ([name]) [J96.01] Atrial fibrillation with RVR ([place]) [I48.91] Community acquired pneumonia of right lung, unspecified part of lung [J18.9] Acute on chronic congestive heart failure, unspecified heart failure type ([place]) [I50.9] Acute respiratory failure with hypoxemia ([name]) [J96.01]  Acute kidney injury on chronic kidney disease stage  IIIb. Baseline creatinine 2.03/GFR 42 on November 04, 2021. Renal ultrasound without obstruction.  No IV contrast exposure.AKI likely secondary to cardiorenal syndrome.  CRRT started on June 17 and stopped on June 20.  Creatinine and BUN remain elevated.  Patient will receive intermittent dialysis treatment today, no UF.  May require additional treatment tomorrow, will assess with morning labs and patient presentation.  Acute respiratory failure Currently ventilator assisted.   Completed antibiotic therapy for CAP  Acute exacerbation of chronic systolic and diastolic CHF 2D echo September 05, 2021-severe global reduction in LV function, severe LVH, EF 20 to 25%, global hypokinesis, grade 3 diastolic dysfunction     LOS: [place] 6/21/202311:01 [place], [place]

## 2022-02-05 NOTE — Progress Notes (Signed)
ANTICOAGULATION CONSULT NOTE   Pharmacy Consult for Heparin Infusion Indication: ACS/STEMI/atrial fibrillation  Patient Measurements: Height: 6' (182.9 cm) Weight: (!) 143.4 kg (316 lb 2.2 oz) IBW/kg (Calculated) : 77.6 Heparin Dosing Weight: 109.9 kg  Labs: Recent Labs    02/03/22 0412 02/03/22 1535 02/04/22 0412 02/04/22 2056 02/05/22 0335 02/05/22 1658  HGB 11.1*  --  10.5*  --  9.9*  --   HCT 36.6*  --  35.3*  --  32.7*  --   PLT 188  --  198  --  177  --   HEPARINUNFRC 0.46  --  0.58  --   --  0.37  CREATININE 3.44*   < > 4.26* 6.19* 6.69*  --    < > = values in this interval not displayed.     Estimated Creatinine Clearance: 21.4 mL/min (A) (by C-G formula based on SCr of 6.69 mg/dL (H)).   Medical History: Past Medical History:  Diagnosis Date   Acute on chronic combined systolic and diastolic CHF (congestive heart failure) ([name])    Acute on chronic systolic CHF (congestive heart failure) ([place]) 42/35/3614   Acute systolic congestive heart failure ([name])    Atrial flutter, paroxysmal ([name])    CHF (congestive heart failure) ([place]) 03/30/2021   Chronic combined systolic and diastolic CHF (congestive heart failure) ([name])    Chronic kidney disease, stage III (moderate) ([name])    Dysrhythmia    brief episode afib, cardioverted did not return   Hypertension    Hypertensive crisis 01/20/2021   Hypertensive emergency 12/21/2020   NSTEMI (non-ST elevated myocardial infarction) ([place]) 12/21/2015   Polysubstance abuse ([place])     Medications:  PTA meds include Xarelto 20 mg daily.  Last dose unknown.    Assessment: 41 y/o male with h/o Afib, CKD, HTN, substance abuse, CAD, NSTEMI and CHF who is admitted with PNA, sepsis, CHF and PEA arrest. Patient with bleeding from femoral site following removal of trialysis catheter ~heparin drip held and PAD device in place ~bleeding currently appears to have subsided and heparin is now being restarted  Goal of Therapy:  Heparin level  0.3-0.7 units/ml Monitor platelets by anticoagulation protocol: Yes   Plan:  6/21@1658 : HL 0.37, therapeutic x 1 Continue heparin at 2100 units/hr Avoiding bolus currently d/t recent bleeding Check confirmatory heparin level in 8 hours CBC daily while on IV heparin  Pearla Dubonnet 02/05/2022 5:19 PM

## 2022-02-05 NOTE — Progress Notes (Signed)
Progress Note  Patient Name: Gerald [name] Date of Encounter: 02/05/2022  [facility] Cardiologist: Ida Rogue, MD   Subjective   Patient seen on AM rounds. Remains intubated and sedated. Up 2.2 L in the last 24 hours. Continues with minimal urine output.   Inpatient Medications    Scheduled Meds:  alteplase  2 mg Intracatheter Once   budesonide (PULMICORT) nebulizer solution  0.5 mg Nebulization BID   Chlorhexidine Gluconate Cloth  6 each Topical Q0600   docusate  100 mg Per Tube BID   feeding supplement (PROSource TF)  90 mL Per Tube TID   free water  30 mL Per Tube Q4H   insulin aspart  0-6 Units Subcutaneous Q4H   ipratropium-albuterol  3 mL Nebulization Q4H   lidocaine  1 patch Transdermal Q24H   multivitamin  1 tablet Per Tube QHS   multivitamin  15 mL Per Tube Daily   mouth rinse  15 mL Mouth Rinse Q2H   pantoprazole sodium  40 mg Per Tube Daily   polyethylene glycol  17 g Per Tube Daily   Continuous Infusions:  sodium chloride Stopped (02/02/22 2010)   feeding supplement (VITAL HIGH PROTEIN) 40 mL/hr at 02/05/22 0543   fentaNYL infusion INTRAVENOUS 100 mcg/hr (02/05/22 0543)   phenylephrine (NEO-SYNEPHRINE) Adult infusion Stopped (02/03/22 1208)   propofol (DIPRIVAN) infusion 30 mcg/kg/min (02/05/22 0646)   PRN Meds: sodium chloride, acetaminophen, guaiFENesin-dextromethorphan, heparin, mouth rinse, mouth rinse   Vital Signs    Vitals:   02/05/22 0500 02/05/22 0600 02/05/22 0646 02/05/22 0700  BP:      Pulse: 93 83 84 82  Resp: 20 20 (!) 21 (!) 22  Temp: 99.1 F (37.3 C) 99.1 F (37.3 C) 99 F (37.2 C) 99 F (37.2 C)  TempSrc:      SpO2: 97% 97% 97% 97%  Weight:      Height:        Intake/Output Summary (Last 24 hours) at 02/05/2022 0758 Last data filed at 02/05/2022 0543 Gross per 24 hour  Intake 2315.1 ml  Output 155 ml  Net 2160.1 ml      02/05/2022    3:50 AM 02/04/2022    5:00 AM 02/03/2022    4:22 AM  Last 3 Weights  Weight  (lbs) 312 lb 9.8 oz 319 lb 3.6 oz 313 lb 0.9 oz  Weight (kg) 141.8 kg 144.8 kg 142 kg      Telemetry    Atrial fibrillation rate controlled in the 80-90's - Personally Reviewed  ECG    No new tracings - Personally Reviewed  Physical Exam   GEN: No acute distress. Remains intubated and sedated. Neck: Difficult to assess JVD due to ETT Cardiac: irregularly irregular, no murmurs, rubs, or gallops.  Respiratory: Coarse to auscultation bilaterally. Respirations are vent assisted and unlabored GI: Soft, nontender, non-distended, bowel sounds hypoactive MS: Trace edema to bilateral lower extremities; No deformity. Neuro:  Intubated and sedated  Psych: Unable to assess at this time  Labs    High Sensitivity Troponin:   Recent Labs  Lab 01/19/2022 0023 01/20/2022 0153 01/22/2022 0537 01/28/2022 0638  TROPONINIHS 553* 472* 500* 450*     Chemistry Recent Labs  Lab 02/02/22 0317 02/02/22 2012 02/03/22 0412 02/03/22 1535 02/04/22 0412 02/04/22 2056  NA 135   < > 135 135 137 136  K 3.7   < > 3.6 3.8 3.8 4.1  CL 104   < > 104 103 106 102  CO2  19*   < > 24 23 23 23   GLUCOSE 123*   < > 107* 124* 113* 112*  BUN 56*   < > 34* 37* 44* 64*  CREATININE 5.14*   < > 3.44* 3.36* 4.26* 6.19*  CALCIUM 8.4*   < > 8.5* 8.8* 8.3* 8.8*  MG 2.1  --  2.2  --  2.4  --   PROT 6.6  --  6.9  --  7.0  --   ALBUMIN 2.6*   < > 2.6* 2.5* 2.6* 2.5*  AST 2,351*  --  905*  --  455*  --   ALT 2,221*  --  1,665*  --  1,232*  --   ALKPHOS 94  --  157*  --  171*  --   BILITOT 0.9  --  1.2  --  0.9  --   GFRNONAA 14*   < > 22* 23* 17* 11*  ANIONGAP 12   < > 7 9 8 11    < > = values in this interval not displayed.    Lipids  Recent Labs  Lab 02/04/22 0412  TRIG 118    Hematology Recent Labs  Lab 02/03/22 0412 02/04/22 0412 02/05/22 0335  WBC 6.7 6.6 7.4  RBC 5.48 5.19 4.89  HGB 11.1* 10.5* 9.9*  HCT 36.6* 35.3* 32.7*  MCV 66.8* 68.0* 66.9*  MCH 20.3* 20.2* 20.2*  MCHC 30.3 29.7* 30.3  RDW 18.5*  18.6* 18.3*  PLT 188 198 177   Thyroid  Recent Labs  Lab 01/29/2022 0638  TSH 0.920    BNP Recent Labs  Lab 02/05/2022 0023  BNP 793.1*    DDimer  Recent Labs  Lab 02/03/2022 0638  DDIMER 0.40     Radiology    DG Chest 1 View  Result Date: 02/04/2022 CLINICAL DATA:  Catheter insertion. EXAM: PORTABLE CHEST 1 VIEW COMPARISON:  Chest XR, 02/03/2022.  CT chest, 07/07/2021. FINDINGS: Support lines: ETT with catheter tip unchanged and within the midthoracic trachea. New LEFT IJ non tunneled dialysis catheter, with tip at the LEFT brachiocephalic vein/SVC junction. Large bore enteric tube with tip excluded from view. Overlying cutaneous leads. Cardiomediastinal silhouette is enlarged and unchanged. Similar pulmonary findings with hypoinflation perihilar interstitial thickening, and LEFT basilar opacity. No large pleural effusion or pneumothorax. No interval osseous abnormality. IMPRESSION: 1. New LEFT IJ non tunneled dialysis catheter, with tip at the brachiocephalic vein/SVC junction. Additional lines and tubes as above. 2. Unchanged pulmonary findings.  No pneumothorax. Electronically Signed   By: Michaelle Birks M.D.   On: 02/04/2022 08:40    Cardiac Studies  Echocardiogram completed on 09/05/2021 1. Severe global reduction in LV function; severe LVH; suggest cardiac MRI to R/O infiltrative cardiomyopathy or HCM.   2. Left ventricular ejection fraction, by estimation, is 20 to 25%. The  left ventricle has severely decreased function. The left ventricle  demonstrates global hypokinesis. The left ventricular internal cavity size was moderately dilated. There is severe left ventricular hypertrophy. Left ventricular diastolic parameters are consistent with Grade III diastolic dysfunction (restrictive).   3. Right ventricular systolic function is normal. The right ventricular  size is normal. There is mildly elevated pulmonary artery systolic  pressure.   4. Left atrial size was severely dilated.    5. A small pericardial effusion is present.   6. The mitral valve is normal in structure. Trivial mitral valve  regurgitation. No evidence of mitral stenosis.   7. The aortic valve is tricuspid. Aortic valve regurgitation is  not  visualized. No aortic stenosis is present.   8. Aortic dilatation noted. There is mild dilatation of the aortic root,  measuring 40 mm. There is mild dilatation of the ascending aorta,  measuring 40 mm.   9. The inferior vena cava is normal in size with greater than 50%  respiratory variability, suggesting right atrial pressure of 3 mmHg.   Patient Profile     41 y.o. male with a history of nonischemic cardiomyopathy, polysubstance use, obesity who presented with worsening shortness of breath and respiratory failure leading to a PEA arrest s/p CPR and given epip with ROSC, who is being seen for NICM and atrial fibrillation RVR.  Assessment & Plan    1.HFrEF secondary to NICM -LVEF 25% on last 2D echocardiogram -LHC completed 12/21/2020 revealed normal coronaries  -off of pressors currently -volume control per HD -continue supportive care -ACEI/ARB/ARNI held due to AKI and hypotension -currently not on beta blocker due to hypotension, consider starting has patient progresses -escalate GDMT when able and tolerated -not a candidate for LVAD or transplant due to noncompliance with medical therapy and cocaine use  2. Persistent atrial fibrillation -rate controlled with beta blockers on hold due to previous hypotension -recommend restart heparin drip for atrial fibrillation with CHADSVASc of at least 2 (CHF and HTN), drip was held due to bleeding from femoral dialysis catheter -amiodarone was placed on hold due to bradycardia and worsening LFT's  3.Respiratory failure due to decompensated NICM, pneumonia, and fluid overload -remains intubated and sedated -failed SBT yesterday -supportive care per CCM -patient has currently finished the course of IV  antibiotics  4. Multiorgan failure  -CRRT started on 6/17 and stopped on 6/20 with plans for HD today -Nephrology following -elevated LFT's being trended, amiodarone and statins are on hold    For questions or updates, please contact [place] Please consult [email or web address] for contact info under        Signed, Hennessy Bartel, NP  02/05/2022, 7:58 AM

## 2022-02-06 ENCOUNTER — Inpatient Hospital Stay: Payer: Medicaid Other

## 2022-02-06 LAB — RENAL FUNCTION PANEL
Albumin: 2.6 g/dL — ABNORMAL LOW (ref 3.5–5.0)
Anion gap: 15 (ref 5–15)
BUN: 99 mg/dL — ABNORMAL HIGH (ref 6–20)
CO2: 18 mmol/L — ABNORMAL LOW (ref 22–32)
Calcium: 8.9 mg/dL (ref 8.9–10.3)
Chloride: 102 mmol/L (ref 98–111)
Creatinine, Ser: 7.99 mg/dL — ABNORMAL HIGH (ref 0.61–1.24)
GFR, Estimated: 8 mL/min — ABNORMAL LOW (ref >60)
Glucose, Bld: 125 mg/dL — ABNORMAL HIGH (ref 70–99)
Phosphorus: 9.5 mg/dL — ABNORMAL HIGH (ref 2.5–4.6)
Potassium: 4.3 mmol/L (ref 3.5–5.1)
Sodium: 135 mmol/L (ref 135–145)

## 2022-02-06 LAB — BASIC METABOLIC PANEL
Anion gap: 13 (ref 5–15)
BUN: 85 mg/dL — ABNORMAL HIGH (ref 6–20)
CO2: 20 mmol/L — ABNORMAL LOW (ref 22–32)
Calcium: 8.9 mg/dL (ref 8.9–10.3)
Chloride: 101 mmol/L (ref 98–111)
Creatinine, Ser: 7.08 mg/dL — ABNORMAL HIGH (ref 0.61–1.24)
GFR, Estimated: 9 mL/min — ABNORMAL LOW (ref >60)
Glucose, Bld: 133 mg/dL — ABNORMAL HIGH (ref 70–99)
Potassium: 4.1 mmol/L (ref 3.5–5.1)
Sodium: 134 mmol/L — ABNORMAL LOW (ref 135–145)

## 2022-02-06 LAB — PROCALCITONIN
Procalcitonin: 7.74 ng/mL
Procalcitonin: 5.92 ng/mL

## 2022-02-06 LAB — PHOSPHORUS: Phosphorus: 8.2 mg/dL — ABNORMAL HIGH (ref 2.5–4.6)

## 2022-02-06 LAB — CBC
HCT: 32.5 % — ABNORMAL LOW (ref 39.0–52.0)
Hemoglobin: 10 g/dL — ABNORMAL LOW (ref 13.0–17.0)
MCH: 20.2 pg — ABNORMAL LOW (ref 26.0–34.0)
MCHC: 30.8 g/dL (ref 30.0–36.0)
MCV: 65.7 fL — ABNORMAL LOW (ref 80.0–100.0)
Platelets: 181 10*3/uL (ref 150–400)
RBC: 4.95 MIL/uL (ref 4.22–5.81)
RDW: 17.9 % — ABNORMAL HIGH (ref 11.5–15.5)
WBC: 9.2 10*3/uL (ref 4.0–10.5)
nRBC: 0.7 % — ABNORMAL HIGH (ref 0.0–0.2)

## 2022-02-06 LAB — HEPATIC FUNCTION PANEL
ALT: 652 U/L — ABNORMAL HIGH (ref 0–44)
AST: 172 U/L — ABNORMAL HIGH (ref 15–41)
Albumin: 2.7 g/dL — ABNORMAL LOW (ref 3.5–5.0)
Alkaline Phosphatase: 137 U/L — ABNORMAL HIGH (ref 38–126)
Bilirubin, Direct: 0.2 mg/dL (ref 0.0–0.2)
Indirect Bilirubin: 0.2 mg/dL — ABNORMAL LOW (ref 0.3–0.9)
Total Bilirubin: 0.4 mg/dL (ref 0.3–1.2)
Total Protein: 7.3 g/dL (ref 6.5–8.1)

## 2022-02-06 LAB — GLUCOSE, CAPILLARY
Glucose-Capillary: 121 mg/dL — ABNORMAL HIGH (ref 70–99)
Glucose-Capillary: 124 mg/dL — ABNORMAL HIGH (ref 70–99)
Glucose-Capillary: 132 mg/dL — ABNORMAL HIGH (ref 70–99)
Glucose-Capillary: 132 mg/dL — ABNORMAL HIGH (ref 70–99)
Glucose-Capillary: 135 mg/dL — ABNORMAL HIGH (ref 70–99)
Glucose-Capillary: 139 mg/dL — ABNORMAL HIGH (ref 70–99)

## 2022-02-06 LAB — HEPARIN LEVEL (UNFRACTIONATED): Heparin Unfractionated: 0.38 IU/mL (ref 0.30–0.70)

## 2022-02-06 MED ORDER — HEPARIN SODIUM (PORCINE) 1000 UNIT/ML IJ SOLN
INTRAMUSCULAR | Status: AC
  Filled 2022-02-06: qty 10

## 2022-02-06 MED ORDER — NOREPINEPHRINE 16 MG/250ML-% IV SOLN
0.0000 ug/min | INTRAVENOUS | Status: DC
  Filled 2022-02-06: qty 250

## 2022-02-06 MED ORDER — ALBUMIN HUMAN 25 % IV SOLN
25.0000 g | Freq: Once | INTRAVENOUS | Status: AC
Start: 2022-02-06 — End: 2022-02-10
  Administered 2022-02-10: 25 g via INTRAVENOUS

## 2022-02-06 MED ORDER — STERILE WATER FOR INJECTION IJ SOLN
INTRAMUSCULAR | Status: AC
  Administered 2022-02-06: 10 mL
  Filled 2022-02-06: qty 10

## 2022-02-06 MED ORDER — PROSOURCE TF PO LIQD
90.0000 mL | Freq: Two times a day (BID) | ORAL | Status: DC
  Administered 2022-02-06 – 2022-02-14 (×15): 90 mL
  Filled 2022-02-06 (×16): qty 90

## 2022-02-06 MED ORDER — VITAL HIGH PROTEIN PO LIQD
1000.0000 mL | ORAL | Status: DC
  Administered 2022-02-06 – 2022-02-11 (×5): 1000 mL

## 2022-02-06 NOTE — Progress Notes (Signed)
Progress Note  Patient Name: Gerald [name] Date of Encounter: 02/06/2022  [facility] Cardiologist: Ida Rogue, MD  Subjective   Intubated, sedated No events overnight 30% FiO2, heart rates in the 80s atrial fibrillation on telemetry Scheduled for dialysis today/CRRT Systolic pressures in the 120 range Low-grade temperature  Inpatient Medications    Scheduled Meds:  alteplase  2 mg Intracatheter Once   budesonide (PULMICORT) nebulizer solution  0.5 mg Nebulization BID   Chlorhexidine Gluconate Cloth  6 each Topical Q0600   clonazePAM  0.5 mg Per Tube BID   docusate  100 mg Per Tube BID   feeding supplement (PROSource TF)  90 mL Per Tube TID   free water  30 mL Per Tube Q4H   heparin sodium (porcine)       insulin aspart  0-6 Units Subcutaneous Q4H   ipratropium-albuterol  3 mL Nebulization Q4H   lidocaine  1 patch Transdermal Q24H   multivitamin  1 tablet Per Tube QHS   multivitamin  15 mL Per Tube Daily   mouth rinse  15 mL Mouth Rinse Q2H   oxyCODONE  5 mg Per Tube Q8H   pantoprazole sodium  40 mg Per Tube Daily   polyethylene glycol  17 g Per Tube Daily   QUEtiapine  25 mg Per Tube QHS   Continuous Infusions:  sodium chloride Stopped (02/02/22 2010)   albumin human     dexmedetomidine (PRECEDEX) IV infusion 1 mcg/kg/hr (02/06/22 0818)   feeding supplement (VITAL HIGH PROTEIN) 40 mL/hr at 02/06/22 0818   fentaNYL infusion INTRAVENOUS 125 mcg/hr (02/06/22 0818)   heparin 2,100 Units/hr (02/06/22 0818)   PRN Meds: sodium chloride, acetaminophen, alteplase, guaiFENesin-dextromethorphan, heparin, heparin, heparin sodium (porcine), lidocaine (PF), lidocaine-prilocaine, mouth rinse, mouth rinse, pentafluoroprop-tetrafluoroeth   Vital Signs    Vitals:   02/06/22 0500 02/06/22 0600 02/06/22 0700 02/06/22 0854  BP:      Pulse:   74 83  Resp: 16 18 18 20   Temp: (!) 100.8 F (38.2 C) (!) 100.8 F (38.2 C) (!) 100.8 F (38.2 C) (!) 100.8 F (38.2 C)   TempSrc:      SpO2:   99% 100%  Weight:      Height:        Intake/Output Summary (Last 24 hours) at 02/06/2022 0905 Last data filed at 02/06/2022 0818 Gross per 24 hour  Intake 2696.3 ml  Output 1772 ml  Net 924.3 ml      02/06/2022    3:30 AM 02/05/2022    4:52 PM 02/05/2022    1:39 PM  Last 3 Weights  Weight (lbs) 317 lb 14.5 oz 316 lb 2.2 oz 319 lb 7.1 oz  Weight (kg) 144.2 kg 143.4 kg 144.9 kg      Telemetry    Atrial fibrillation rate 80s- Personally Reviewed  ECG     - Personally Reviewed  Physical Exam   GEN: No acute distress.  Intubated sedated Neck: No JVD Cardiac: Irregularly irregular no murmurs, rubs, or gallops.  Respiratory: Clear to auscultation bilaterally. GI: Soft, nontender, non-distended  MS: No edema; No deformity. Neuro:  Nonfocal  Psych: Normal affect   Labs    High Sensitivity Troponin:   Recent Labs  Lab 02/06/2022 0023 02/04/2022 0153 01/16/2022 0537 01/26/2022 0638  TROPONINIHS 553* 472* 500* 450*     Chemistry Recent Labs  Lab 02/02/22 9937 02/02/22 2012 02/03/22 1696 02/03/22 1535 02/04/22 7893 02/04/22 2056 02/05/22 0335 02/05/22 1744 02/06/22 0338  NA  135   < > 135   < > 137 136 136 134* 134*  K 3.7   < > 3.6   < > 3.8 4.1 3.9 3.7 4.1  CL 104   < > 104   < > 106 102 103 102 101  CO2 19*   < > 24   < > 23 23 21* 21* 20*  GLUCOSE 123*   < > 107*   < > 113* 112* 112* 133* 133*  BUN 56*   < > 34*   < > 44* 64* 72* 65* 85*  CREATININE 5.14*   < > 3.44*   < > 4.26* 6.19* 6.69* 6.11* 7.08*  CALCIUM 8.4*   < > 8.5*   < > 8.3* 8.8* 8.8* 8.8* 8.9  MG 2.1  --  2.2  --  2.4  --   --   --   --   PROT 6.6  --  6.9  --  7.0  --   --   --   --   ALBUMIN 2.6*   < > 2.6*   < > 2.6* 2.5* 2.4* 2.7*  --   AST 2,351*  --  905*  --  455*  --   --   --   --   ALT 2,221*  --  1,665*  --  1,232*  --   --   --   --   ALKPHOS 94  --  157*  --  171*  --   --   --   --   BILITOT 0.9  --  1.2  --  0.9  --   --   --   --   GFRNONAA 14*   < > 22*    < > 17* 11* 10* 11* 9*  ANIONGAP 12   < > 7   < > 8 11 12 11 13    < > = values in this interval not displayed.    Lipids  Recent Labs  Lab 02/04/22 0412  TRIG 118    Hematology Recent Labs  Lab 02/04/22 0412 02/05/22 0335 02/06/22 0338  WBC 6.6 7.4 9.2  RBC 5.19 4.89 4.95  HGB 10.5* 9.9* 10.0*  HCT 35.3* 32.7* 32.5*  MCV 68.0* 66.9* 65.7*  MCH 20.2* 20.2* 20.2*  MCHC 29.7* 30.3 30.8  RDW 18.6* 18.3* 17.9*  PLT 198 177 181   Thyroid No results for input(s): "TSH", "FREET4" in the last 168 hours.  BNPNo results for input(s): "BNP", "PROBNP" in the last 168 hours.  DDimer No results for input(s): "DDIMER" in the last 168 hours.   Radiology    DG Chest Port 1 View  Result Date: 02/06/2022 CLINICAL DATA:  Acute respiratory failure. EXAM: PORTABLE CHEST 1 VIEW COMPARISON:  02/04/2022 FINDINGS: 0743 hours. Low volume film. Asymmetric elevation right hemidiaphragm, as before. The cardio pericardial silhouette is enlarged. There is pulmonary vascular congestion without overt pulmonary edema. The NG tube passes into the stomach although the distal tip position is not included on the film. Left IJ central line tip is positioned at the innominate vein confluence. Telemetry leads overlie the chest. IMPRESSION: 1. Low volume film with asymmetric elevation right hemidiaphragm. 2. Pulmonary vascular congestion without overt pulmonary edema. Electronically Signed   By: Misty Stanley M.D.   On: 02/06/2022 07:55    Cardiac Studies  Echocardiogram September 05, 2021  1. Severe global reduction in LV function; severe LVH; suggest cardiac  MRI to R/O infiltrative cardiomyopathy or  HCM.   2. Left ventricular ejection fraction, by estimation, is 20 to 25%. The  left ventricle has severely decreased function. The left ventricle  demonstrates global hypokinesis. The left ventricular internal cavity size  was moderately dilated. There is severe   left ventricular hypertrophy. Left ventricular  diastolic parameters are  consistent with Grade III diastolic dysfunction (restrictive).   3. Right ventricular systolic function is normal. The right ventricular  size is normal. There is mildly elevated pulmonary artery systolic  pressure.   4. Left atrial size was severely dilated.   5. A small pericardial effusion is present.   6. The mitral valve is normal in structure. Trivial mitral valve  regurgitation. No evidence of mitral stenosis.   7. The aortic valve is tricuspid. Aortic valve regurgitation is not  visualized. No aortic stenosis is present.   8. Aortic dilatation noted. There is mild dilatation of the aortic root,  measuring 40 mm. There is mild dilatation of the ascending aorta,  measuring 40 mm.   9. The inferior vena cava is normal in size with greater than 50%  respiratory variability, suggesting right atrial pressure of 3 mmHg.    Patient Profile     41 y.o. male with a history of nonischemic cardiomyopathy, polysubstance use, obesity who presented with worsening shortness of breath and respiratory failure leading to a PEA arrest s/p CPR and given epip with ROSC, who is being seen for NICM and atrial fibrillation RVR.  Assessment & Plan    1.HFrEF secondary to NICM -LVEF 25% on last 2D echocardiogram -LHC completed 12/21/2020 revealed normal coronaries  Off pressors, remains on sedation intubated FiO2 30% requiring dialysis -ACEI/ARB/ARNI held due to AKI and hypotension -currently not on beta blocker due to hypotension,  -not a candidate for LVAD or transplant due to noncompliance with medical therapy and cocaine use --As extubated will look to initiate low-dose beta-blockers   2. Persistent atrial fibrillation -rate controlled with beta blockers on hold due to previous hypotension -Would continue heparin drip for atrial fibrillation with CHADSVASc of at least 2 (CHF and HTN), drip was held due to bleeding from femoral dialysis catheter -amiodarone was placed on  hold due to bradycardia and worsening LFT's -Rate currently controlled under sedation with rates in the 80s but likely to accelerate as sedation is weaned. Would use IV Lopressor as needed for rates over 100   3.Respiratory failure due to decompensated NICM, pneumonia, and fluid overload intubated and sedated -failed SBT yesterday finished the course of IV antibiotics  4. Multiorgan failure , end-stage renal disease on dialysis -CRRT started on 6/17 and stopped on 6/20 with plans for HD today -Nephrology following -elevated LFT's being trended, amiodarone and statins are on hold Consider shock liver versus hepatic congestion -----Consider palliative consult and discussion with family concerning goals of care   Total encounter time more than 50 minutes  Greater than 50% was spent in counseling and coordination of care with the patient   For questions or updates, please contact [place] Please consult [email or web address] for contact info under        Signed, Ida Rogue, MD  02/06/2022, 9:05 AM

## 2022-02-06 NOTE — Progress Notes (Signed)
[place], [place] 02/06/22  Subjective:   Hospital day # 7  Patient critically ill. Neuro: Sedation with Precedex and fentanyl, weaned off Propofol  cvs: No pressors, heparin drip pulm: Ventilator assisted.  FiO2 30% with 5 of PEEP.  gi: Tube feeds - 40 cc/h. Renal: Foley in place - 335 mL in 24 hours. Dialysis UF 1.5L yesterday  06/21 0701 - 06/22 0700 In: 2392.9 [I.V.:1501.6; NG/GT:891.3] Out: 1837 [Urine:335; Stool:1] Lab Results  Component Value Date   CREATININE 7.08 (H) 02/06/2022   CREATININE 6.11 (H) 02/05/2022   CREATININE 6.69 (H) 02/05/2022     Objective:  Vital signs in last 24 hours:  Temp:  [99.1 F (37.3 C)-100.8 F (38.2 C)] 100.8 F (38.2 C) (06/22 0934) Pulse Rate:  [51-93] 83 (06/22 0854) Resp:  [16-35] 17 (06/22 0934) SpO2:  [97 %-100 %] 100 % (06/22 0854) Arterial Line BP: (121-138)/(75-86) 131/81 (06/22 0700) FiO2 (%):  [30 %] 30 % (06/22 0854) Weight:  [143.4 kg-148.1 kg] 148.1 kg (06/22 0926)  Weight change: 3.1 kg Filed Weights   02/05/22 1652 02/06/22 0330 02/06/22 0926  Weight: (!) 143.4 kg (!) 144.2 kg (!) 148.1 kg    Intake/Output:    Intake/Output Summary (Last 24 hours) at 02/06/2022 1205 Last data filed at 02/06/2022 1130 Gross per 24 hour  Intake 2336 ml  Output 2077 ml  Net 259 ml      Physical Exam: General: Critically ill-appearing, laying in the bed  HEENT ET tube, OG tube in place  Pulm/lungs Ventilator assisted  CVS/Heart Atrial fibrillation, irregular  Abdomen:  Soft  Extremities: Some dependent edema present  Neurologic: Sedated  Skin: Warm, dry  Access: Right femoral dialysis catheter in place       Basic Metabolic Panel:  Recent Labs  Lab 01/31/22 0505 01/31/22 0802 02/01/22 0321 02/02/22 1749 02/02/22 2012 02/03/22 4496 02/03/22 1535 02/04/22 7591 02/04/22 2056 02/05/22 0335 02/05/22 1744 02/06/22 0338  NA 137   < > 136 135   < > 135   < > 137 136 136 134*  134*  K 3.5   < > 4.4 3.7   < > 3.6   < > 3.8 4.1 3.9 3.7 4.1  CL 106   < > 104 104   < > 104   < > 106 102 103 102 101  CO2 21*   < > 20* 19*   < > 24   < > 23 23 21* 21* 20*  GLUCOSE 133*   < > 97 123*   < > 107*   < > 113* 112* 112* 133* 133*  BUN 31*   < > 45* 56*   < > 34*   < > 44* 64* 72* 65* 85*  CREATININE 2.44*   < > 4.77* 5.14*   < > 3.44*   < > 4.26* 6.19* 6.69* 6.11* 7.08*  CALCIUM 8.8*   < > 8.6* 8.4*   < > 8.5*   < > 8.3* 8.8* 8.8* 8.8* 8.9  MG 2.1  --  2.2 2.1  --  2.2  --  2.4  --   --   --   --   PHOS  --   --   --  6.8*   < > 3.8   < > 5.1* 7.3* 7.4*  7.6* 5.9* 8.2*   < > = values in this interval not displayed.      CBC: Recent Labs  Lab 01/31/22 0802 02/01/22  4580 02/02/22 0317 02/03/22 0412 02/04/22 0412 02/05/22 0335 02/06/22 0338  WBC 12.7* 11.8* 9.8 6.7 6.6 7.4 9.2  NEUTROABS 10.3* 8.8* 6.9 4.8  --   --   --   HGB 10.6* 10.3* 10.6* 11.1* 10.5* 9.9* 10.0*  HCT 35.3* 35.1* 35.2* 36.6* 35.3* 32.7* 32.5*  MCV 68.7* 69.8* 68.8* 66.8* 68.0* 66.9* 65.7*  PLT 218 195 214 188 198 177 181       Lab Results  Component Value Date   HEPBSAG NON REACTIVE 02/04/2022   HEPBSAB NON REACTIVE 02/04/2022   HEPBIGM NON REACTIVE 02/02/2022      Microbiology:  Recent Results (from the past 240 hour(s))  Blood culture (routine x 2)     Status: None   Collection Time: 02/10/2022  1:17 AM   Specimen: BLOOD  Result Value Ref Range Status   Specimen Description BLOOD LEFT HAND  Final   Special Requests   Final    BOTTLES DRAWN AEROBIC AND ANAEROBIC Blood Culture adequate volume   Culture   Final    NO GROWTH 5 DAYS Performed at [facility], [address] [place], [place] [address]    Report Status 02/04/2022 FINAL  Final  Resp Panel by RT-PCR (Flu A&B, Covid) Anterior Nasal Swab     Status: None   Collection Time: 01/18/2022  1:17 AM   Specimen: Anterior Nasal Swab  Result Value Ref Range Status   SARS Coronavirus 2 by RT PCR NEGATIVE NEGATIVE  Final    Comment: (NOTE) SARS-CoV-2 target nucleic acids are NOT DETECTED.  The SARS-CoV-2 RNA is generally detectable in upper respiratory specimens during the acute phase of infection. The lowest concentration of SARS-CoV-2 viral copies this assay can detect is 138 copies/mL. A negative result does not preclude SARS-Cov-2 infection and should not be used as the sole basis for treatment or other patient management decisions. A negative result may occur with  improper specimen collection/handling, submission of specimen other than nasopharyngeal swab, presence of viral mutation(s) within the areas targeted by this assay, and inadequate number of viral copies(<138 copies/mL). A negative result must be combined with clinical observations, patient history, and epidemiological information. The expected result is Negative.  Fact Sheet for Patients:  EntrepreneurPulse.com.au  Fact Sheet for Healthcare Providers:  IncredibleEmployment.be  This test is no t yet approved or cleared by the [place] FDA and  has been authorized for detection and/or diagnosis of SARS-CoV-2 by FDA under an Emergency Use Authorization (EUA). This EUA will remain  in effect (meaning this test can be used) for the duration of the COVID-19 declaration under Section 564(b)(1) of the Act, 21 U.S.C.section 360bbb-3(b)(1), unless the authorization is terminated  or revoked sooner.       Influenza A by PCR NEGATIVE NEGATIVE Final   Influenza B by PCR NEGATIVE NEGATIVE Final    Comment: (NOTE) The Xpert Xpress SARS-CoV-2/FLU/RSV plus assay is intended as an aid in the diagnosis of influenza from Nasopharyngeal swab specimens and should not be used as a sole basis for treatment. Nasal washings and aspirates are unacceptable for Xpert Xpress SARS-CoV-2/FLU/RSV testing.  Fact Sheet for Patients: EntrepreneurPulse.com.au  Fact Sheet for Healthcare  Providers: IncredibleEmployment.be  This test is not yet approved or cleared by the [place] FDA and has been authorized for detection and/or diagnosis of SARS-CoV-2 by FDA under an Emergency Use Authorization (EUA). This EUA will remain in effect (meaning this test can be used) for the duration of the COVID-19 declaration under Section  564(b)(1) of the Act, 21 U.S.C. section 360bbb-3(b)(1), unless the authorization is terminated or revoked.  Performed at [facility], [address] [place], [place] [address]   Blood culture (routine x 2)     Status: None   Collection Time: 02/10/2022  1:18 AM   Specimen: BLOOD  Result Value Ref Range Status   Specimen Description BLOOD LEFT ASSIST CONTROL  Final   Special Requests   Final    BOTTLES DRAWN AEROBIC AND ANAEROBIC Blood Culture adequate volume   Culture   Final    NO GROWTH 5 DAYS Performed at [facility], [address], [place], [place] [address]    Report Status 02/04/2022 FINAL  Final  MRSA Next Gen by PCR, Nasal     Status: None   Collection Time: 01/21/2022  3:01 AM   Specimen: Nasal Mucosa; Nasal Swab  Result Value Ref Range Status   MRSA by PCR Next Gen NOT DETECTED NOT DETECTED Final    Comment: (NOTE) The GeneXpert MRSA Assay (FDA approved for NASAL specimens only), is one component of a comprehensive MRSA colonization surveillance program. It is not intended to diagnose MRSA infection nor to guide or monitor treatment for MRSA infections. Test performance is not FDA approved in patients less than 66 years old. Performed at [facility], [address] [place], [place] [address]   Expectorated Sputum Assessment w Gram Stain, Rflx to Resp Cult     Status: None   Collection Time: 02/04/2022  6:07 AM   Specimen: Expectorated Sputum  Result Value Ref Range Status   Specimen Description EXPECTORATED SPUTUM  Final   Special Requests NONE  Final   Sputum evaluation    Final    THIS SPECIMEN IS ACCEPTABLE FOR SPUTUM CULTURE Performed at [facility], [address], [place], [place] [address]    Report Status 01/17/2022 FINAL  Final  Culture, Respiratory w Gram Stain     Status: None   Collection Time: 02/01/2022  6:07 AM  Result Value Ref Range Status   Specimen Description   Final    EXPECTORATED SPUTUM Performed at [facility], [address] [place], [place] [address]    Special Requests   Final    NONE Reflexed from [id number] Performed at [facility], [address] [place], [place] [address]    Gram Stain   Final    RARE WBC PRESENT, PREDOMINANTLY PMN RARE GRAM POSITIVE COCCI IN PAIRS    Culture   Final    FEW Normal respiratory flora-no Staph aureus or Pseudomonas seen Performed at [facility], [place] [address], [place], [place] [address]    Report Status 02/01/2022 FINAL  Final    Coagulation Studies: No results for input(s): "LABPROT", "INR" in the last 72 hours.   Urinalysis: No results for input(s): "COLORURINE", "LABSPEC", "PHURINE", "GLUCOSEU", "HGBUR", "BILIRUBINUR", "KETONESUR", "PROTEINUR", "UROBILINOGEN", "NITRITE", "LEUKOCYTESUR" in the last 72 hours.  Invalid input(s): "APPERANCEUR"     Imaging: DG Chest Port 1 View  Result Date: 02/06/2022 CLINICAL DATA:  Acute respiratory failure. EXAM: PORTABLE CHEST 1 VIEW COMPARISON:  02/04/2022 FINDINGS: 0743 hours. Low volume film. Asymmetric elevation right hemidiaphragm, as before. The cardio pericardial silhouette is enlarged. There is pulmonary vascular congestion without overt pulmonary edema. The NG tube passes into the stomach although the distal tip position is not included on the film. Left IJ central line tip is positioned at the innominate vein confluence. Telemetry leads overlie the chest. IMPRESSION:  1. Low volume film with asymmetric elevation right hemidiaphragm. 2. Pulmonary vascular congestion without overt pulmonary edema.  Electronically Signed   By: Misty Stanley M.D.   On: 02/06/2022 07:55     Medications:    sodium chloride Stopped (02/02/22 2010)   albumin human     dexmedetomidine (PRECEDEX) IV infusion 1 mcg/kg/hr (02/06/22 1053)   feeding supplement (VITAL HIGH PROTEIN) 40 mL/hr at 02/06/22 0818   fentaNYL infusion INTRAVENOUS 125 mcg/hr (02/06/22 0818)   heparin 2,100 Units/hr (02/06/22 0942)   norepinephrine (LEVOPHED) Adult infusion      alteplase  2 mg Intracatheter Once   budesonide (PULMICORT) nebulizer solution  0.5 mg Nebulization BID   Chlorhexidine Gluconate Cloth  6 each Topical Q0600   clonazePAM  0.5 mg Per Tube BID   docusate  100 mg Per Tube BID   feeding supplement (PROSource TF)  90 mL Per Tube TID   free water  30 mL Per Tube Q4H   heparin sodium (porcine)       insulin aspart  0-6 Units Subcutaneous Q4H   ipratropium-albuterol  3 mL Nebulization Q4H   lidocaine  1 patch Transdermal Q24H   multivitamin  1 tablet Per Tube QHS   multivitamin  15 mL Per Tube Daily   mouth rinse  15 mL Mouth Rinse Q2H   oxyCODONE  5 mg Per Tube Q8H   pantoprazole sodium  40 mg Per Tube Daily   polyethylene glycol  17 g Per Tube Daily   QUEtiapine  25 mg Per Tube QHS   sodium chloride, acetaminophen, guaiFENesin-dextromethorphan, heparin, heparin, heparin sodium (porcine), lidocaine (PF), lidocaine-prilocaine, mouth rinse, mouth rinse, pentafluoroprop-tetrafluoroeth  Assessment/ Plan:  41 y.o. male with   chronic systolic and diastolic CHF, hypertension, anemia, polysubstance abuse (cocaine and tobacco), chronic kidney disease, non-STEMI May 2023, A-fib with RVR admitted on 01/26/2022 for Acute respiratory failure ([name]) [J96.00] Acute respiratory failure with hypoxia ([name]) [J96.01] Atrial fibrillation with RVR ([place]) [I48.91] Community acquired pneumonia of right lung, unspecified part of lung [J18.9] Acute on chronic congestive heart failure, unspecified heart failure type ([name]) [I50.9] Acute  respiratory failure with hypoxemia ([name]) [J96.01]  Acute kidney injury on chronic kidney disease stage IIIb. Baseline creatinine 2.03/GFR 42 on November 04, 2021. Renal ultrasound without obstruction.  No IV contrast exposure.AKI likely secondary to cardiorenal syndrome.  CRRT started on June 17 and stopped on June 20.  Renal function continues to decline, will continue dialysis at this time. Remains oliguric. Will perform dialysis treatment today. Catheter not functioning well during initial treatment. HD RN to instill Cathflo and assess after dwell.   Acute respiratory failure Currently ventilator assisted.   Completed antibiotic therapy for CAP  Acute exacerbation of chronic systolic and diastolic CHF 2D echo September 05, 2021-severe global reduction in LV function, severe LVH, EF 20 to 25%, global hypokinesis, grade 3 diastolic dysfunction Will manage fluid status with dialysis     LOS: [place] 6/22/202312:05 PM  [place], [place]

## 2022-02-06 NOTE — Progress Notes (Signed)
ANTICOAGULATION CONSULT NOTE   Pharmacy Consult for Heparin Infusion Indication: ACS/STEMI/atrial fibrillation  Patient Measurements: Height: 6' (182.9 cm) Weight: (!) 143.4 kg (316 lb 2.2 oz) IBW/kg (Calculated) : 77.6 Heparin Dosing Weight: 109.9 kg  Labs: Recent Labs    02/03/22 0412 02/03/22 1535 02/04/22 0412 02/04/22 2056 02/05/22 0335 02/05/22 1658 02/05/22 1744 02/06/22 0028  HGB 11.1*  --  10.5*  --  9.9*  --   --   --   HCT 36.6*  --  35.3*  --  32.7*  --   --   --   PLT 188  --  198  --  177  --   --   --   HEPARINUNFRC 0.46  --  0.58  --   --  0.37  --  0.38  CREATININE 3.44*   < > 4.26* 6.19* 6.69*  --  6.11*  --    < > = values in this interval not displayed.     Estimated Creatinine Clearance: 23.4 mL/min (A) (by C-G formula based on SCr of 6.11 mg/dL (H)).   Medical History: Past Medical History:  Diagnosis Date   Acute on chronic combined systolic and diastolic CHF (congestive heart failure) ([name])    Acute on chronic systolic CHF (congestive heart failure) ([place]) 40/81/4481   Acute systolic congestive heart failure ([name])    Atrial flutter, paroxysmal ([name])    CHF (congestive heart failure) ([place]) 03/30/2021   Chronic combined systolic and diastolic CHF (congestive heart failure) ([name])    Chronic kidney disease, stage III (moderate) ([name])    Dysrhythmia    brief episode afib, cardioverted did not return   Hypertension    Hypertensive crisis 01/20/2021   Hypertensive emergency 12/21/2020   NSTEMI (non-ST elevated myocardial infarction) ([place]) 12/21/2015   Polysubstance abuse ([place])     Medications:  PTA meds include Xarelto 20 mg daily.  Last dose unknown.    Assessment: 41 y/o male with h/o Afib, CKD, HTN, substance abuse, CAD, NSTEMI and CHF who is admitted with PNA, sepsis, CHF and PEA arrest. Patient with bleeding from femoral site following removal of trialysis catheter ~heparin drip held and PAD device in place ~bleeding currently appears to have  subsided and heparin is now being restarted  Goal of Therapy:  Heparin level 0.3-0.7 units/ml Monitor platelets by anticoagulation protocol: Yes   Plan:  6/22:  HL @ 0028 = 0.38, therapeutic X 2 Will continue pt on current rate and recheck HL on 6/23 with AM labs.   Requan Hardge D 02/06/2022 1:28 AM

## 2022-02-06 NOTE — Progress Notes (Signed)
Nutrition Follow-up  DOCUMENTATION CODES:   Obesity unspecified  INTERVENTION:   -Continue TF via OGT:   Vital High Protein @ 40 ml/hr and increase by 10 ml every 4 hours to goal rate of 60 ml/hr.   90 ml Prosource TF BID  30 ml free water flush every 4 hours to maintain tube patency  Tube feeding regimen provides 1600 kcal (100% of needs), 170 grams of protein, and 1203 ml of H2O.  Total free water: 1383 ml daily  -Renal MVI daily via tube  NUTRITION DIAGNOSIS:   Inadequate oral intake related to inability to eat (pt sedated and ventilated) as evidenced by NPO status.  Ongoing  GOAL:   Provide needs based on ASPEN/SCCM guidelines  Progressing   MONITOR:   Vent status, Labs, Weight trends, TF tolerance, Skin, I & O's  REASON FOR ASSESSMENT:   Ventilator    ASSESSMENT:   41 y/o male with h/o Afib, CKD, HTN, substance abuse, CAD, NSTEMI and CHF who is admitted with PNA, sepsis, CHF and PEA arrest.  Patient is currently intubated on ventilator support MV: 12.9 L/min Temp (24hrs), Avg:100.2 F (37.9 C), Min:98.8 F (37.1 C), Max:100.8 F (38.2 C)  Reviewed I/O's: +556 ml x 24 hours and +4.4 L since admission  UOP: 335 ml x 24 hours   Case discussed with RN, MD, and during ICU rounds. Pt currently receiving HD treatments. Pt opens eyes spontaneously and followed commands this AM. Plan for weaning trials after HD.   Per NP, pt with low grade temps; respiratory cultures pending.   Pt tolerating TF of Vital High Protein @ 40 ml/hr via OGT. Noted propofol is off.   Medications reviewed and include miralax, IV albumin, precedex, and fentanyl.   Labs reviewed: CBGS: 132 (inpatient orders for glycemic control are 0-6 units insulin aspart every 4 hours).    Diet Order:   Diet Order     None       EDUCATION NEEDS:   No education needs have been identified at this time  Skin:  Skin Assessment: Reviewed RN Assessment  Last BM:  02/05/22  Height:   Ht  Readings from Last 1 Encounters:  01/24/2022 6' (1.829 m)    Weight:   Wt Readings from Last 1 Encounters:  02/06/22 (!) 148.1 kg    Ideal Body Weight:  80.9 kg  BMI:  Body mass index is 44.28 kg/m.  Estimated Nutritional Needs:   Kcal:  1446-1841kcal/day  Protein:  >160g/day  Fluid:  2.5-2.8L/day    Loistine Chance, RD, LDN, [place] Registered Dietitian II Certified Diabetes Care and Education Specialist Please refer to [facility] for RD and/or RD on-call/weekend/after hours pager

## 2022-02-06 NOTE — Progress Notes (Signed)
NAME:  Gerald [name], MRN:  [id number], DOB:  1981/04/12, LOS: 7 ADMISSION DATE:  01/28/2022 CONSULTATION DATE:  02/06/2022  Brief patient description Gerald [name]:  41 yo morbidly obese AAM with acute sCHF exacerbation with acute hypoxic resp failure with pulm edema and renal failure leading to severe resp failure due to severe end stage nonischemic sCHF exacerbation leading to progressive cardiorenal syndrome with renal failure requiring CRRT  History of Present Illness:  41 y.o. male with history of chronic systolic and diastolic CHF last EF measured was 20 to 25% in January 2023 with history of hypertension, chronic kidney disease stage III baseline creatinine around 2,  anemia polysubstance abuse admits to taking cocaine presents to the ER because of worsening shortness of breath for the last 3 days with no associated chest pain has some productive cough.   Patient states he has not taken his medicines for last 3 days.  Admitted to [facility] with SOB    ED Course: In the ER patient was hypoxic initially requiring BiPAP but after placing on BiPAP patient had vomiting and had to be taken off the BiPAP was placed on high flow oxygen.  Patient chest x-ray shows congestion was placed on IV Lasix and also empirically on antibiotics.   BNP 790 high sensitive troponin was 553 and 4 and 72 was started on IV heparin.  Patient admitted for acute hypoxic respiratory failure likely from CHF.  Since patient was in A-fib with RVR initially was started on Cardizem infusion.  Transition to amiodarone.  PCCM consulted to assess worsening SOB and increased WOB today 6/15   Pertinent  Medical History   Active Ambulatory Problems    Diagnosis Date Noted   Community acquired pneumonia of right lung 12/21/2015   Acute respiratory distress 04/01/2019   Acute on chronic combined systolic and diastolic CHF (congestive heart failure) ([name])    Atrial flutter ([name])    Essential hypertension    PAF (paroxysmal atrial  fibrillation) ([place]) 07/05/2020   Elevated troponin 07/05/2020   Microcytic anemia 07/05/2020   Hypokalemia 07/05/2020   Hypertensive emergency 12/21/2020   Stage 3a chronic kidney disease (CKD) ([place]) - baseline SCr 1.8-2.0 12/21/2020   Cocaine abuse ([place]) 01/20/2021   Obesity, Class III, BMI 40-49.9 (morbid obesity) ([place]) 01/20/2021   Acute on chronic congestive heart failure ([place]) 03/30/2021   Restless leg 07/08/2021   Severe uncontrolled hypertension 09/05/2021   Atrial fibrillation with rapid ventricular response ([place]) 11/02/2021   Resolved Ambulatory Problems    Diagnosis Date Noted   NSTEMI (non-ST elevated myocardial infarction) ([place]) 12/21/2015   Persistent atrial fibrillation ([name])    Acute systolic congestive heart failure ([name])    Acute on chronic systolic CHF (congestive heart failure) ([place]) 07/05/2020   Hypertensive urgency 07/05/2020   Hypertensive crisis 90/24/0973   Acute diastolic CHF (congestive heart failure) ([place]) 03/30/2021   Hemoptysis 07/07/2021   Past Medical History:  Diagnosis Date   Atrial flutter, paroxysmal ([name])    CHF (congestive heart failure) ([place]) 03/30/2021   Chronic combined systolic and diastolic CHF (congestive heart failure) ([place])    Chronic kidney disease, stage III (moderate) ([place])    Dysrhythmia    Hypertension    Polysubstance abuse ([employer])     Significant Hospital Events: Including procedures, antibiotic start and stop dates in addition to other pertinent events   6/14 admitted for acute sCHF 6/15 PCCM consulted for impending resp failure 2023-02-22 patient coded for 1 round with the intubation tried from respiratory  arrest 06/17 patient is anuric dialysis catheter is and nephrology is involved he might need CRRT or HD 06/18 increase in liver enzymes to above 1000 we will hold amiodarone and atorvastatin 6/19 multiorgan failure, on CRRT  6/20: Failed SBT due to delirium and increased work of breathing, tachycardia, diaphoresis.   Transitioned from CRRT to intermittent HD (plan for first HD session 6/21) 6/21: Plan for HD this morning, then will attempt SBT.  Precedex as needed for agitation/delirium with wake up assessment 6/22: Pt remains mechanically intubated requiring minimal ventilator settings.  Sedated with precedex and fentanyl gtts agitation/delirium improving. Contacted nephrology requesting HD session today prior to SBT trial.  Low-grade fevers overnight blood and respiratory cultures pending   Micro Data:  6/15: SARS-CoV-2 and influenza PCR>> negative 6/15: Blood culture x2>> no growth 6/15: MRSA PCR>> negative 6/15: Sputum>> normal respiratory flora 6/15: Strep pneumo and Legionella urinary antigens>> negative 6/16: Mycoplasma pneumonia a IgM>> less than 770 6/18: Hepatitis B>> nonreactive 6/22: Blood x2>> 6/22: Respiratory>>  Antimicrobials:   Antibiotics Given (last 72 hours)     Date/Time Action Medication Dose Rate   02/03/22 0859 New Bag/Given   metroNIDAZOLE (FLAGYL) IVPB 500 mg 500 mg 100 mL/hr   02/03/22 1005 New Bag/Given   ceFEPIme (MAXIPIME) 2 g in sodium chloride 0.9 % 100 mL IVPB 2 g 200 mL/hr        Interim History / Subjective:  As outlined above under significant events  Objective   Blood pressure (!) 89/73, pulse 74, temperature (!) 100.6 F (38.1 C), temperature source Bladder, resp. rate 20, height 6' (1.829 m), weight (!) 144.2 kg, SpO2 98 %.    Vent Mode: PRVC FiO2 (%):  [30 %] 30 % Set Rate:  [20 bmp] 20 bmp Vt Set:  [550 mL] 550 mL PEEP:  [5 cmH20] 5 cmH20 Plateau Pressure:  [17 cmH20-20 cmH20] 20 cmH20   Intake/Output Summary (Last 24 hours) at 02/06/2022 7681 Last data filed at 02/06/2022 0400 Gross per 24 hour  Intake 2392.93 ml  Output 1837 ml  Net 555.93 ml   Filed Weights   02/05/22 1339 02/05/22 1652 02/06/22 0330  Weight: (!) 144.9 kg (!) 143.4 kg (!) 144.2 kg    REVIEW OF SYSTEMS  PATIENT IS UNABLE TO PROVIDE COMPLETE REVIEW OF SYSTEMS DUE TO  SEVERE CRITICAL ILLNESS    PHYSICAL EXAMINATION: GENERAL: Acute on chronically ill-appearing obese male, NAD mechanically intubated  HEENT: Supple, no JVD  PULMONARY: Faint rhonchi throughout, even, non labored  CARDIOVASCULAR: Regular rate and rhythm, S1-S2, no m/r/g GASTROINTESTINAL: Obese, soft, non distended. +BS x4 MUSCULOSKELETAL: No swelling, clubbing, or edema.  NEUROLOGIC: Sedated,  following commands, PERRL SKIN: Intact no rashes or lesions present   Labs/imaging that I havepersonally reviewed  (right click and "Reselect all SmartList Selections" daily)  All labs reviewed 02/06/22  ASSESSMENT AND PLAN ACUTE Hypoxic  Respiratory Failure in setting of acute decompensated HFrEF -Full vent support, implement lung protective strategies -Plateau pressures less than 30 cm H20 -Wean FiO2 & PEEP as tolerated to maintain O2 sats >92% -Follow intermittent Chest X-ray & ABG as needed -Spontaneous Breathing Trials when respiratory parameters met and mental status permits -Implement VAP Bundle -Bronchodilators & Pulmicort nebs -Volume removal with dialysis  Acute Decompensated Combined Systolic & Diastolic CHF (EF 15%) secondary to NICM Afib with RVR Mildly elevated troponin, suspect demand ischemia Echocardiogram from 09/05/21: LVEF 20 to 72%, grade 3 diastolic dysfunction, RV systolic function normal, pulmonary artery hypertension -Continuous cardiac monitoring -Maintain  MAP >65 -Vasopressors as needed to maintain MAP goal  -HS Troponin peaked at 553 -Cardiology following, appreciate input -Continue heparin drip for anticoagulation -Due to increased liver enzymes, holding amiodarone and statin  ACUTE KIDNEY INJURY, suspect Cardiorenal Syndrome  -Monitor I&O's / urinary output -Follow BMP -Ensure adequate renal perfusion -Avoid nephrotoxic agents as able -Replace electrolytes as indicated -Nephrology following, appreciate input: plans for HD 06/22 in preparation for  SBT  Elevated LFTs, suspect in the setting of congestion with CHF ~improving  RUQ abdominal ultrasound 06/18 with fatty liver, otherwise unremarkable -Trend LFTs -Continue to hold amiodarone and statins for now  Hyperglycemia -CBG's q4h; Target range of 140 to 180 -SSI -Follow ICU Hypo/Hyperglycemia protocol  Low-grade fever -Trend WBC and monitor fever curve  -Will check PCT if elevated will initiate empiric abx  -Follow cultures  -CXR pending   ANEMIA without s/sx of overt bleeding -Monitor for S/Sx of bleeding -Trend CBC -Heparin drip for anticoagulation/VTE Prophylaxis  -Transfuse for Hgb <7  Acute Metabolic Encephalopathy Sedation needs in setting of mechanical ventilation PMHx: Polysubstance abuse UDS negative  -Maintain a RASS goal of 0 to -1 -Fentanyl and precedex as needed to maintain RASS goal -Avoid sedating medications as able -Daily wake up assessment  Best practice (right click and "Reselect all SmartList Selections" daily)  Diet: Tube feeds DVT prophylaxis: Systemic AC Foley: Yes and still needed  Mobility:  bed rest  Code Status:  FULL Disposition: ICU  Labs   CBC: Recent Labs  Lab 01/31/22 0802 02/01/22 0321 02/02/22 0317 02/03/22 0412 02/04/22 0412 02/05/22 0335 02/06/22 0338  WBC 12.7* 11.8* 9.8 6.7 6.6 7.4 9.2  NEUTROABS 10.3* 8.8* 6.9 4.8  --   --   --   HGB 10.6* 10.3* 10.6* 11.1* 10.5* 9.9* 10.0*  HCT 35.3* 35.1* 35.2* 36.6* 35.3* 32.7* 32.5*  MCV 68.7* 69.8* 68.8* 66.8* 68.0* 66.9* 65.7*  PLT 218 195 214 188 198 177 003    Basic Metabolic Panel: Recent Labs  Lab 01/31/22 0505 01/31/22 0802 02/01/22 0321 02/02/22 0317 02/02/22 2012 02/03/22 0412 02/03/22 1535 02/04/22 0412 02/04/22 2056 02/05/22 0335 02/05/22 1744 02/06/22 0338  NA 137   < > 136 135   < > 135   < > 137 136 136 134* 134*  K 3.5   < > 4.4 3.7   < > 3.6   < > 3.8 4.1 3.9 3.7 4.1  CL 106   < > 104 104   < > 104   < > 106 102 103 102 101  CO2 21*   < >  20* 19*   < > 24   < > 23 23 21* 21* 20*  GLUCOSE 133*   < > 97 123*   < > 107*   < > 113* 112* 112* 133* 133*  BUN 31*   < > 45* 56*   < > 34*   < > 44* 64* 72* 65* 85*  CREATININE 2.44*   < > 4.77* 5.14*   < > 3.44*   < > 4.26* 6.19* 6.69* 6.11* 7.08*  CALCIUM 8.8*   < > 8.6* 8.4*   < > 8.5*   < > 8.3* 8.8* 8.8* 8.8* 8.9  MG 2.1  --  2.2 2.1  --  2.2  --  2.4  --   --   --   --   PHOS  --   --   --  6.8*   < > 3.8   < >  5.1* 7.3* 7.4*  7.6* 5.9* 8.2*   < > = values in this interval not displayed.   GFR: Estimated Creatinine Clearance: 20.2 mL/min (A) (by C-G formula based on SCr of 7.08 mg/dL (H)). Recent Labs  Lab 02/03/22 0412 02/04/22 0412 02/05/22 0335 02/06/22 0338  WBC 6.7 6.6 7.4 9.2    Liver Function Tests: Recent Labs  Lab 01/31/22 0802 02/01/22 0321 02/02/22 0317 02/02/22 2012 02/03/22 0412 02/03/22 1535 02/04/22 0412 02/04/22 2056 02/05/22 0335 02/05/22 1744  AST 21 256* 2,351*  --  905*  --  455*  --   --   --   ALT 26 270* 2,221*  --  1,665*  --  1,232*  --   --   --   ALKPHOS 64 63 94  --  157*  --  171*  --   --   --   BILITOT 0.8 1.2 0.9  --  1.2  --  0.9  --   --   --   PROT 6.8 6.4* 6.6  --  6.9  --  7.0  --   --   --   ALBUMIN 3.0* 2.6* 2.6*   < > 2.6* 2.5* 2.6* 2.5* 2.4* 2.7*   < > = values in this interval not displayed.   No results for input(s): "LIPASE", "AMYLASE" in the last 168 hours. Recent Labs  Lab 01/31/22 0505  AMMONIA 23    ABG    Component Value Date/Time   PHART 7.27 (L) 02/02/2022 0500   PCO2ART 44 02/02/2022 0500   PO2ART 125 (H) 02/02/2022 0500   HCO3 20.2 02/02/2022 0500   ACIDBASEDEF 6.6 (H) 02/02/2022 0500   O2SAT 99.5 02/02/2022 0500     Coagulation Profile: No results for input(s): "INR", "PROTIME" in the last 168 hours.  Cardiac Enzymes: No results for input(s): "CKTOTAL", "CKMB", "CKMBINDEX", "TROPONINI" in the last 168 hours.  HbA1C: Hgb A1c MFr Bld  Date/Time Value Ref Range Status  09/05/2021 05:00  PM 6.1 (H) 4.8 - 5.6 % Final    Comment:    (NOTE)         Prediabetes: 5.7 - 6.4         Diabetes: >6.4         Glycemic control for adults with diabetes: <7.0   03/30/2021 08:25 PM 6.2 (H) 4.8 - 5.6 % Final    Comment:    (NOTE) Pre diabetes:          5.7%-6.4%  Diabetes:              >6.4%  Glycemic control for   <7.0% adults with diabetes     CBG: Recent Labs  Lab 02/05/22 1124 02/05/22 1519 02/05/22 1931 02/05/22 2312 02/06/22 0333  GLUCAP 122* 126* 124* 126* 121*     Critical Care Time: 35 minutes   Donell Beers, [place] Pager [phone] (please enter 7 digits) PCCM Consult Pager [phone] (please enter 7 digits)

## 2022-02-07 ENCOUNTER — Encounter: Admission: EM | Disposition: E | Payer: Self-pay | Source: Home / Self Care | Attending: Internal Medicine

## 2022-02-07 DIAGNOSIS — Z992 Dependence on renal dialysis: Secondary | ICD-10-CM

## 2022-02-07 DIAGNOSIS — N186 End stage renal disease: Secondary | ICD-10-CM

## 2022-02-07 HISTORY — PX: DIALYSIS/PERMA CATHETER INSERTION: CATH118288

## 2022-02-07 LAB — PHOSPHORUS: Phosphorus: 8.7 mg/dL — ABNORMAL HIGH (ref 2.5–4.6)

## 2022-02-07 LAB — GLUCOSE, CAPILLARY
Glucose-Capillary: 121 mg/dL — ABNORMAL HIGH (ref 70–99)
Glucose-Capillary: 117 mg/dL — ABNORMAL HIGH (ref 70–99)
Glucose-Capillary: 114 mg/dL — ABNORMAL HIGH (ref 70–99)
Glucose-Capillary: 117 mg/dL — ABNORMAL HIGH (ref 70–99)
Glucose-Capillary: 125 mg/dL — ABNORMAL HIGH (ref 70–99)

## 2022-02-07 LAB — BASIC METABOLIC PANEL
Anion gap: 12 (ref 5–15)
BUN: 87 mg/dL — ABNORMAL HIGH (ref 6–20)
CO2: 22 mmol/L (ref 22–32)
Calcium: 8.9 mg/dL (ref 8.9–10.3)
Chloride: 101 mmol/L (ref 98–111)
Creatinine, Ser: 7.45 mg/dL — ABNORMAL HIGH (ref 0.61–1.24)
GFR, Estimated: 9 mL/min — ABNORMAL LOW (ref >60)
Glucose, Bld: 134 mg/dL — ABNORMAL HIGH (ref 70–99)
Potassium: 4.3 mmol/L (ref 3.5–5.1)
Sodium: 135 mmol/L (ref 135–145)

## 2022-02-07 LAB — CBC
HCT: 32.2 % — ABNORMAL LOW (ref 39.0–52.0)
Hemoglobin: 9.9 g/dL — ABNORMAL LOW (ref 13.0–17.0)
MCH: 20.3 pg — ABNORMAL LOW (ref 26.0–34.0)
MCHC: 30.7 g/dL (ref 30.0–36.0)
MCV: 66 fL — ABNORMAL LOW (ref 80.0–100.0)
Platelets: 190 10*3/uL (ref 150–400)
RBC: 4.88 MIL/uL (ref 4.22–5.81)
RDW: 18.4 % — ABNORMAL HIGH (ref 11.5–15.5)
WBC: 10.2 10*3/uL (ref 4.0–10.5)
nRBC: 0 % (ref 0.0–0.2)

## 2022-02-07 LAB — HEPARIN LEVEL (UNFRACTIONATED): Heparin Unfractionated: 0.36 IU/mL (ref 0.30–0.70)

## 2022-02-07 SURGERY — DIALYSIS/PERMA CATHETER INSERTION
Anesthesia: Moderate Sedation

## 2022-02-07 MED ORDER — SODIUM CHLORIDE 0.9 % IV SOLN
1.0000 g | INTRAVENOUS | Status: DC
  Administered 2022-02-08 – 2022-02-10 (×3): 1 g via INTRAVENOUS
  Filled 2022-02-07 (×3): qty 1

## 2022-02-07 MED ORDER — VANCOMYCIN HCL 2000 MG/400ML IV SOLN
2000.0000 mg | Freq: Once | INTRAVENOUS | Status: AC
  Administered 2022-02-07: 2000 mg via INTRAVENOUS
  Filled 2022-02-07: qty 400

## 2022-02-07 MED ORDER — TRAZODONE HCL 50 MG PO TABS
50.0000 mg | ORAL_TABLET | Freq: Two times a day (BID) | ORAL | Status: DC
  Administered 2022-02-07: 50 mg
  Filled 2022-02-07: qty 1

## 2022-02-07 MED ORDER — HEPARIN SODIUM (PORCINE) 1000 UNIT/ML IJ SOLN
INTRAMUSCULAR | Status: AC
  Filled 2022-02-07: qty 10

## 2022-02-07 MED ORDER — OXYCODONE HCL 5 MG/5ML PO SOLN
5.0000 mg | Freq: Four times a day (QID) | ORAL | Status: DC
  Administered 2022-02-07 – 2022-02-08 (×3): 5 mg
  Filled 2022-02-07 (×3): qty 5

## 2022-02-07 MED ORDER — SODIUM CHLORIDE 0.9 % IV SOLN
1.0000 g | Freq: Once | INTRAVENOUS | Status: DC
  Filled 2022-02-07: qty 10

## 2022-02-07 MED ORDER — MIDAZOLAM HCL 2 MG/2ML IJ SOLN
4.0000 mg | Freq: Once | INTRAMUSCULAR | Status: AC
  Administered 2022-02-07: 4 mg via INTRAVENOUS

## 2022-02-07 MED ORDER — MIDAZOLAM HCL 2 MG/2ML IJ SOLN
2.0000 mg | INTRAMUSCULAR | Status: DC | PRN
  Administered 2022-02-07: 2 mg via INTRAVENOUS
  Administered 2022-02-07 – 2022-02-08 (×2): 4 mg via INTRAVENOUS
  Administered 2022-02-08: 2 mg via INTRAVENOUS
  Administered 2022-02-08 (×2): 4 mg via INTRAVENOUS
  Administered 2022-02-08 – 2022-02-09 (×2): 2 mg via INTRAVENOUS
  Administered 2022-02-09: 4 mg via INTRAVENOUS
  Administered 2022-02-09: 2 mg via INTRAVENOUS
  Administered 2022-02-10: 4 mg via INTRAVENOUS
  Administered 2022-02-10: 2 mg via INTRAVENOUS
  Administered 2022-02-12: 4 mg via INTRAVENOUS
  Filled 2022-02-07: qty 4
  Filled 2022-02-07: qty 2
  Filled 2022-02-07: qty 4
  Filled 2022-02-07: qty 2
  Filled 2022-02-07 (×2): qty 4
  Filled 2022-02-07: qty 2
  Filled 2022-02-07 (×2): qty 4
  Filled 2022-02-07: qty 2
  Filled 2022-02-07: qty 4
  Filled 2022-02-07 (×2): qty 2

## 2022-02-07 MED ORDER — MIDAZOLAM HCL 2 MG/2ML IJ SOLN
2.0000 mg | Freq: Once | INTRAMUSCULAR | Status: AC
  Administered 2022-02-07: 2 mg via INTRAVENOUS

## 2022-02-07 MED ORDER — MIDAZOLAM HCL 2 MG/2ML IJ SOLN
INTRAMUSCULAR | Status: AC
  Filled 2022-02-07: qty 4

## 2022-02-07 MED ORDER — VANCOMYCIN HCL IN DEXTROSE 1-5 GM/200ML-% IV SOLN: 1000.0000 mg | INTRAVENOUS | Status: DC | PRN

## 2022-02-07 MED ORDER — METOPROLOL TARTRATE 25 MG PO TABS
12.5000 mg | ORAL_TABLET | Freq: Two times a day (BID) | ORAL | Status: DC
  Administered 2022-02-07 (×2): 12.5 mg via ORAL
  Filled 2022-02-07 (×3): qty 1

## 2022-02-07 MED ORDER — MIDAZOLAM HCL 2 MG/2ML IJ SOLN
INTRAMUSCULAR | Status: AC
  Filled 2022-02-07: qty 2

## 2022-02-07 SURGICAL SUPPLY — 14 items
ADH SKN CLS APL DERMABOND .7 (GAUZE/BANDAGES/DRESSINGS) ×1
BIOPATCH RED 1 DISK 7.0 (GAUZE/BANDAGES/DRESSINGS) ×1 IMPLANT
CATH CANNON HEMO 15FR 19 (HEMODIALYSIS SUPPLIES) ×1 IMPLANT
COVER PROBE U/S 5X48 (MISCELLANEOUS) ×1 IMPLANT
DERMABOND ADVANCED (GAUZE/BANDAGES/DRESSINGS) ×1
DERMABOND ADVANCED .7 DNX12 (GAUZE/BANDAGES/DRESSINGS) IMPLANT
DRAPE INCISE IOBAN 66X45 STRL (DRAPES) ×1 IMPLANT
NDL ENTRY 21GA 7CM ECHOTIP (NEEDLE) IMPLANT
NEEDLE ENTRY 21GA 7CM ECHOTIP (NEEDLE) ×2 IMPLANT
PACK ANGIOGRAPHY (CUSTOM PROCEDURE TRAY) ×1 IMPLANT
SET INTRO CAPELLA COAXIAL (SET/KITS/TRAYS/PACK) ×1 IMPLANT
SUT MNCRL AB 4-0 PS2 18 (SUTURE) ×1 IMPLANT
SUT PROLENE 0 CT 1 30 (SUTURE) ×1 IMPLANT
SUT SILK 0 FSL (SUTURE) ×1 IMPLANT

## 2022-02-07 NOTE — Progress Notes (Signed)
[facility]  Daily Progress Note   Subjective  -   I am asked to reevaluate the patient by Zada Girt, NP  Patient was originally seen for acute renal insufficiency and there was hope that he would recover his renal function.  However, at this point he has had no significant improvement in his renal function and we are anticipating dialysis continuing on the order of at least a few months if not lifelong.  Patient remains intubated he has had multiple temporary catheters but these have not functioned well and his dialysis runs have not been adequate.  I was therefore asked regarding tunneled catheter placement.  Objective Vitals:   02/07/22 1118 02/07/22 1121 02/07/22 1200 02/07/22 1445  BP:      Pulse:  75 82   Resp:  19 19   Temp:    99.9 F (37.7 C)  TempSrc:    Oral  SpO2: 100% 100% 100%   Weight:      Height:        Intake/Output Summary (Last 24 hours) at 02/07/2022 1556 Last data filed at 02/07/2022 1059 Gross per 24 hour  Intake 3337.61 ml  Output 2001 ml  Net 1336.61 ml    PULM  on vent, no use of accessory muscles CV  No JVD, RRR Abd      No distended, nontender VASC  left IJ temporary dialysis catheter present clean dry and intact  Laboratory CBC    Component Value Date/Time   WBC 10.2 02/07/2022 0333   HGB 9.9 (L) 02/07/2022 0333   HCT 32.2 (L) 02/07/2022 0333   PLT 190 02/07/2022 0333    BMET    Component Value Date/Time   NA 135 02/07/2022 0333   K 4.3 02/07/2022 0333   CL 101 02/07/2022 0333   CO2 22 02/07/2022 0333   GLUCOSE 134 (H) 02/07/2022 0333   BUN 87 (H) 02/07/2022 0333   CREATININE 7.45 (H) 02/07/2022 0333   CALCIUM 8.9 02/07/2022 0333   GFRNONAA 9 (L) 02/07/2022 0333   GFRAA >60 04/08/2019 1442    Assessment/Planning:   Acute renal insufficiency which likely is progressing to chronic permanent and now requiring hemodialysis. The patient should undergo placement of a right IJ tunnel catheter.  Consent  has been obtained from next of kin.  Risk and benefits were reviewed.  We will move forward with catheter placement.   Levora Dredge  02/07/2022, 3:56 PM

## 2022-02-07 NOTE — Progress Notes (Signed)
Updated pts mother Patrica Mim's via telephone regarding pt condition and current plan of care.    Zada Girt, AGNP  Pulmonary/Critical Care Pager [phone] (please enter 7 digits) PCCM Consult Pager [phone] (please enter 7 digits)

## 2022-02-07 NOTE — Op Note (Addendum)
[facility]   OPERATIVE NOTE     PROCEDURE: 1. Insertion of a Right IJ tunneled dialysis catheter. 2. Catheter placement and cannulation under ultrasound and fluoroscopic guidance  PRE-OPERATIVE DIAGNOSIS: end-stage renal requiring hemodialysis  POST-OPERATIVE DIAGNOSIS: same as above  SURGEON: Levora Dredge  ANESTHESIA: Conscious sedation was administered under my direct supervision by the interventional radiology RN. IV Versed plus fentanyl were utilized. Continuous ECG, pulse oximetry and blood pressure was monitored throughout the entire procedure.  Conscious sedation was for a total of 19 minutes and 32 seconds.  ESTIMATED BLOOD LOSS: Minimal  FINDING(S): 1.  Tips of the catheter in the right atrium on fluoroscopy 2.  No obvious pneumothorax on fluoroscopy  SPECIMEN(S):  none  INDICATIONS:   Gerald [name] is a 41 y.o. male  presents with end stage renal disease.  Therefore, the patient requires a tunneled dialysis catheter placement.  The patient is informed of  the risks catheter placement include but are not limited to: bleeding, infection, central venous injury, pneumothorax, possible venous stenosis, possible malpositioning in the venous system, and possible infections related to long-term catheter presence.  The patient was aware of these risks and agreed to proceed.  DESCRIPTION: The patient was taken back to Special Procedure suite.  Prior to sedation, the patient was given IV antibiotics.  After obtaining adequate sedation, the patient was prepped and draped in the standard fashion for a right IJ tunneled dialysis catheter placement.  Appropriate Time Out is called.     The right neck and chest wall are then infiltrated with 1% Lidocaine with epinepherine.  A 19 cm tip to cuff catheter is then selected, opened on the back table and prepped. Ultrasound is placed in a sterile sleeve.  Under ultrasound guidance, the right IJ vein is examined and  is noted to be echolucent and easily compressible indicating patency.   An image is recorded for the permanent record.  The right IJ vein is cannulated with the microneedle under direct ultrasound vissualization.  A Microwire followed by a micro sheath is then inserted without difficulty.   A J-wire was then advanced under fluoroscopic guidance into the inferior vena cava and the wire was secured.  Small counter incision was then made at the wire insertion site. A small pocket was fashioned with blunt dissection to allow easier passage of the cuff.  The dilator and peel-away sheath are then advanced over the wire under fluoroscopic guidance. The catheters and advanced through the peel-away sheath. It is approximated to the right chest after verifying the tips at the atrial caval junction and an exit site is selected.  Small incision is made at the selected exit site and the tunneling device was passed subcutaneously to the counter incision. Catheter is then pulled through the subcutaneous tunnel. The catheter is then verified for tip position under fluoroscopy, transected and the hub assembly connected.  Each port was tested by aspirating and flushing.  No resistance was noted.  Each port was then thoroughly flushed with heparinized saline.  The catheter was secured in placed with two interrupted stitches of 0 silk tied to the catheter.  The counter incision was closed with a U-stitch of 4-0 Monocryl.  The insertion site is then cleaned and sterile bandages applied including a Biopatch.  Each port was then packed with concentrated heparin (1000 Units/mL) at the manufacturer recommended volumes to each port.  Sterile caps were applied to each port.  On completion fluoroscopy, the tips of  the catheter were in the right atrium, and there was no evidence of pneumothorax.  COMPLICATIONS: None  CONDITION: Unchanged   Levora Dredge [facility] Office: [phone]   2022/03/06, 4:35  PM

## 2022-02-08 LAB — MAGNESIUM: Magnesium: 2.4 mg/dL (ref 1.7–2.4)

## 2022-02-08 LAB — CBC WITH DIFFERENTIAL/PLATELET
Abs Immature Granulocytes: 0.12 10*3/uL — ABNORMAL HIGH (ref 0.00–0.07)
Basophils Absolute: 0 10*3/uL (ref 0.0–0.1)
Basophils Relative: 0 %
Eosinophils Absolute: 0.2 10*3/uL (ref 0.0–0.5)
Eosinophils Relative: 2 %
HCT: 31 % — ABNORMAL LOW (ref 39.0–52.0)
Hemoglobin: 9.5 g/dL — ABNORMAL LOW (ref 13.0–17.0)
Immature Granulocytes: 1 %
Lymphocytes Relative: 17 %
Lymphs Abs: 1.7 10*3/uL (ref 0.7–4.0)
MCH: 20.4 pg — ABNORMAL LOW (ref 26.0–34.0)
MCHC: 30.6 g/dL (ref 30.0–36.0)
MCV: 66.5 fL — ABNORMAL LOW (ref 80.0–100.0)
Monocytes Absolute: 1.3 10*3/uL — ABNORMAL HIGH (ref 0.1–1.0)
Monocytes Relative: 13 %
Neutro Abs: 6.7 10*3/uL (ref 1.7–7.7)
Neutrophils Relative %: 67 %
Platelets: 185 10*3/uL (ref 150–400)
RBC: 4.66 MIL/uL (ref 4.22–5.81)
RDW: 18.3 % — ABNORMAL HIGH (ref 11.5–15.5)
Smear Review: NORMAL
WBC: 10.1 10*3/uL (ref 4.0–10.5)
nRBC: 0 % (ref 0.0–0.2)

## 2022-02-08 LAB — RENAL FUNCTION PANEL
Albumin: 2.5 g/dL — ABNORMAL LOW (ref 3.5–5.0)
Anion gap: 13 (ref 5–15)
BUN: 78 mg/dL — ABNORMAL HIGH (ref 6–20)
CO2: 23 mmol/L (ref 22–32)
Calcium: 9.1 mg/dL (ref 8.9–10.3)
Chloride: 97 mmol/L — ABNORMAL LOW (ref 98–111)
Creatinine, Ser: 6.45 mg/dL — ABNORMAL HIGH (ref 0.61–1.24)
GFR, Estimated: 10 mL/min — ABNORMAL LOW (ref >60)
Glucose, Bld: 140 mg/dL — ABNORMAL HIGH (ref 70–99)
Phosphorus: 9.1 mg/dL — ABNORMAL HIGH (ref 2.5–4.6)
Potassium: 4.6 mmol/L (ref 3.5–5.1)
Sodium: 133 mmol/L — ABNORMAL LOW (ref 135–145)

## 2022-02-08 LAB — GLUCOSE, CAPILLARY
Glucose-Capillary: 115 mg/dL — ABNORMAL HIGH (ref 70–99)
Glucose-Capillary: 117 mg/dL — ABNORMAL HIGH (ref 70–99)
Glucose-Capillary: 119 mg/dL — ABNORMAL HIGH (ref 70–99)
Glucose-Capillary: 131 mg/dL — ABNORMAL HIGH (ref 70–99)
Glucose-Capillary: 132 mg/dL — ABNORMAL HIGH (ref 70–99)
Glucose-Capillary: 150 mg/dL — ABNORMAL HIGH (ref 70–99)

## 2022-02-08 LAB — HEPARIN LEVEL (UNFRACTIONATED): Heparin Unfractionated: 0.33 IU/mL (ref 0.30–0.70)

## 2022-02-08 MED ORDER — IPRATROPIUM-ALBUTEROL 0.5-2.5 (3) MG/3ML IN SOLN
3.0000 mL | Freq: Four times a day (QID) | RESPIRATORY_TRACT | Status: DC
  Administered 2022-02-08 – 2022-02-14 (×24): 3 mL via RESPIRATORY_TRACT
  Filled 2022-02-08 (×24): qty 3

## 2022-02-08 MED ORDER — FREE WATER
100.0000 mL | Status: DC
  Administered 2022-02-08 – 2022-02-14 (×28): 100 mL

## 2022-02-08 MED ORDER — FENTANYL CITRATE PF 50 MCG/ML IJ SOSY
25.0000 ug | PREFILLED_SYRINGE | INTRAMUSCULAR | Status: DC | PRN
  Administered 2022-02-08 – 2022-02-10 (×3): 25 ug via INTRAVENOUS

## 2022-02-08 MED ORDER — THIAMINE HCL 100 MG/ML IJ SOLN
100.0000 mg | Freq: Every day | INTRAMUSCULAR | Status: DC
  Administered 2022-02-08 – 2022-02-14 (×7): 100 mg via INTRAVENOUS
  Filled 2022-02-08 (×7): qty 2

## 2022-02-08 MED ORDER — DEXMEDETOMIDINE HCL IN NACL 400 MCG/100ML IV SOLN
0.4000 ug/kg/h | INTRAVENOUS | Status: DC
  Administered 2022-02-08 – 2022-02-09 (×5): 1.2 ug/kg/h via INTRAVENOUS
  Filled 2022-02-08 (×4): qty 100

## 2022-02-08 MED ORDER — METOPROLOL TARTRATE 25 MG PO TABS
25.0000 mg | ORAL_TABLET | Freq: Two times a day (BID) | ORAL | Status: DC
  Administered 2022-02-08 – 2022-02-11 (×7): 25 mg via ORAL
  Filled 2022-02-08 (×6): qty 1

## 2022-02-08 MED ORDER — PHENOBARBITAL 20 MG/5ML PO ELIX
60.0000 mg | ORAL_SOLUTION | Freq: Three times a day (TID) | ORAL | Status: DC
  Administered 2022-02-08 – 2022-02-14 (×18): 60 mg
  Filled 2022-02-08 (×18): qty 15

## 2022-02-08 NOTE — Progress Notes (Signed)
[facility], [place] 02/08/22  Subjective:   Hospital day # 9  Patient was seen today in ICU Patient remains sedated unable to offer any complaints   06/23 0701 - 06/24 0700 In: 2913 [I.V.:1477.8; NG/GT:1035; IV Piggyback:400.1] Out: 1951 [Urine:450] Lab Results  Component Value Date   CREATININE 6.45 (H) 02/08/2022   CREATININE 7.45 (H) 02/03/2022   CREATININE 7.99 (H) 02/06/2022     Objective:  Vital signs in last 24 hours:  Temp:  [98.3 F (36.8 C)-100 F (37.8 C)] 99.6 F (37.6 C) (06/24 0400) Pulse Rate:  [61-130] 87 (06/24 0900) Resp:  [10-34] 19 (06/24 0900) BP: (114-130)/(75-103) 130/94 (06/23 1619) SpO2:  [95 %-100 %] 98 % (06/24 0900) Arterial Line BP: (122-189)/(77-118) 140/86 (06/24 0900) FiO2 (%):  [24 %-30 %] 24 % (06/24 0700) Weight:  [141.9 kg-145.5 kg] 141.9 kg (06/24 0300)  Weight change: -2.9 kg Filed Weights   02/09/2022 1753 01/29/2022 2122 02/08/22 0300  Weight: (!) 145.5 kg (!) 143.7 kg (!) 141.9 kg    Intake/Output:    Intake/Output Summary (Last 24 hours) at 02/08/2022 1610 Last data filed at 02/08/2022 9604 Gross per 24 hour  Intake 2736.74 ml  Output 1951 ml  Net 785.74 ml     Physical Exam: General: Critically ill-appearing, laying in the bed  HEENT ET tube, OG tube in place  Pulm/lungs Ventilator assisted,  rhonchi bilateral Present  CVS/Heart Atrial fibrillation, irregular  Abdomen:  Soft  Extremities: Some dependent edema present  Neurologic: Sedated  Skin: Warm, dry  Access: Right femoral dialysis catheter in place       Basic Metabolic Panel:  Recent Labs  Lab 02/02/22 0317 02/02/22 2012 02/03/22 0412 02/03/22 1535 02/04/22 0412 02/04/22 2056 02/05/22 1744 02/06/22 0338 02/06/22 1539 01/21/2022 0333 02/08/22 0336  NA 135   < > 135   < > 137   < > 134* 134* 135 135 133*  K 3.7   < > 3.6   < > 3.8   < > 3.7 4.1 4.3 4.3 4.6  CL 104   < > 104   < > 106   < > 102 101 102 101 97*  CO2  19*   < > 24   < > 23   < > 21* 20* 18* 22 23  GLUCOSE 123*   < > 107*   < > 113*   < > 133* 133* 125* 134* 140*  BUN 56*   < > 34*   < > 44*   < > 65* 85* 99* 87* 78*  CREATININE 5.14*   < > 3.44*   < > 4.26*   < > 6.11* 7.08* 7.99* 7.45* 6.45*  CALCIUM 8.4*   < > 8.5*   < > 8.3*   < > 8.8* 8.9 8.9 8.9 9.1  MG 2.1  --  2.2  --  2.4  --   --   --   --   --  2.4  PHOS 6.8*   < > 3.8   < > 5.1*   < > 5.9* 8.2* 9.5* 8.7* 9.1*   < > = values in this interval not displayed.     CBC: Recent Labs  Lab 02/02/22 0317 02/03/22 0412 02/04/22 0412 02/05/22 0335 02/06/22 0338 01/22/2022 0333 02/08/22 0336  WBC 9.8 6.7 6.6 7.4 9.2 10.2 10.1  NEUTROABS 6.9 4.8  --   --   --   --  6.7  HGB 10.6* 11.1* 10.5*  9.9* 10.0* 9.9* 9.5*  HCT 35.2* 36.6* 35.3* 32.7* 32.5* 32.2* 31.0*  MCV 68.8* 66.8* 68.0* 66.9* 65.7* 66.0* 66.5*  PLT 214 188 198 177 181 190 185      Lab Results  Component Value Date   HEPBSAG NON REACTIVE 02/04/2022   HEPBSAB NON REACTIVE 02/04/2022   HEPBIGM NON REACTIVE 02/02/2022      Microbiology:  Recent Results (from the past 240 hour(s))  Blood culture (routine x 2)     Status: None   Collection Time: 2022/02/21  1:17 AM   Specimen: BLOOD  Result Value Ref Range Status   Specimen Description BLOOD LEFT HAND  Final   Special Requests   Final    BOTTLES DRAWN AEROBIC AND ANAEROBIC Blood Culture adequate volume   Culture   Final    NO GROWTH 5 DAYS Performed at [facility], [address] [place], [place] [address]    Report Status 02/04/2022 FINAL  Final  Resp Panel by RT-PCR (Flu A&B, Covid) Anterior Nasal Swab     Status: None   Collection Time: 02-21-22  1:17 AM   Specimen: Anterior Nasal Swab  Result Value Ref Range Status   SARS Coronavirus 2 by RT PCR NEGATIVE NEGATIVE Final    Comment: (NOTE) SARS-CoV-2 target nucleic acids are NOT DETECTED.  The SARS-CoV-2 RNA is generally detectable in upper respiratory specimens during the acute phase of  infection. The lowest concentration of SARS-CoV-2 viral copies this assay can detect is 138 copies/mL. A negative result does not preclude SARS-Cov-2 infection and should not be used as the sole basis for treatment or other patient management decisions. A negative result may occur with  improper specimen collection/handling, submission of specimen other than nasopharyngeal swab, presence of viral mutation(s) within the areas targeted by this assay, and inadequate number of viral copies(<138 copies/mL). A negative result must be combined with clinical observations, patient history, and epidemiological information. The expected result is Negative.  Fact Sheet for Patients:  BloggerCourse.com  Fact Sheet for Healthcare Providers:  SeriousBroker.it  This test is no t yet approved or cleared by the [place] FDA and  has been authorized for detection and/or diagnosis of SARS-CoV-2 by FDA under an Emergency Use Authorization (EUA). This EUA will remain  in effect (meaning this test can be used) for the duration of the COVID-19 declaration under Section 564(b)(1) of the Act, 21 U.S.C.section 360bbb-3(b)(1), unless the authorization is terminated  or revoked sooner.       Influenza A by PCR NEGATIVE NEGATIVE Final   Influenza B by PCR NEGATIVE NEGATIVE Final    Comment: (NOTE) The Xpert Xpress SARS-CoV-2/FLU/RSV plus assay is intended as an aid in the diagnosis of influenza from Nasopharyngeal swab specimens and should not be used as a sole basis for treatment. Nasal washings and aspirates are unacceptable for Xpert Xpress SARS-CoV-2/FLU/RSV testing.  Fact Sheet for Patients: BloggerCourse.com  Fact Sheet for Healthcare Providers: SeriousBroker.it  This test is not yet approved or cleared by the [place] FDA and has been authorized for detection and/or diagnosis of SARS-CoV-2  by FDA under an Emergency Use Authorization (EUA). This EUA will remain in effect (meaning this test can be used) for the duration of the COVID-19 declaration under Section 564(b)(1) of the Act, 21 U.S.C. section 360bbb-3(b)(1), unless the authorization is terminated or revoked.  Performed at [facility], [address] [place], [place] [address]   Blood culture (routine x 2)     Status: None  Collection Time: 02/10/2022  1:18 AM   Specimen: BLOOD  Result Value Ref Range Status   Specimen Description BLOOD LEFT ASSIST CONTROL  Final   Special Requests   Final    BOTTLES DRAWN AEROBIC AND ANAEROBIC Blood Culture adequate volume   Culture   Final    NO GROWTH 5 DAYS Performed at [facility], [address], [place], [place] [address]    Report Status 02/04/2022 FINAL  Final  MRSA Next Gen by PCR, Nasal     Status: None   Collection Time: Feb 10, 2022  3:01 AM   Specimen: Nasal Mucosa; Nasal Swab  Result Value Ref Range Status   MRSA by PCR Next Gen NOT DETECTED NOT DETECTED Final    Comment: (NOTE) The GeneXpert MRSA Assay (FDA approved for NASAL specimens only), is one component of a comprehensive MRSA colonization surveillance program. It is not intended to diagnose MRSA infection nor to guide or monitor treatment for MRSA infections. Test performance is not FDA approved in patients less than 9 years old. Performed at [facility], [address] [place], [place] [address]   Expectorated Sputum Assessment w Gram Stain, Rflx to Resp Cult     Status: None   Collection Time: 02/10/22  6:07 AM   Specimen: Expectorated Sputum  Result Value Ref Range Status   Specimen Description EXPECTORATED SPUTUM  Final   Special Requests NONE  Final   Sputum evaluation   Final    THIS SPECIMEN IS ACCEPTABLE FOR SPUTUM CULTURE Performed at [facility], [address], [place], [place] [address]    Report Status 2022/02/10 FINAL  Final  Culture,  Respiratory w Gram Stain     Status: None   Collection Time: Feb 10, 2022  6:07 AM  Result Value Ref Range Status   Specimen Description   Final    EXPECTORATED SPUTUM Performed at [facility], [address] [place], [place] [address]    Special Requests   Final    NONE Reflexed from [id number] Performed at [facility], [address] [place], [place] [address]    Gram Stain   Final    RARE WBC PRESENT, PREDOMINANTLY PMN RARE GRAM POSITIVE COCCI IN PAIRS    Culture   Final    FEW Normal respiratory flora-no Staph aureus or Pseudomonas seen Performed at [facility], [address] [place], [place] [address]    Report Status 02/01/2022 FINAL  Final  Culture, blood (Routine X 2) w Reflex to ID Panel     Status: None (Preliminary result)   Collection Time: 02/06/22  8:34 AM   Specimen: BLOOD  Result Value Ref Range Status   Specimen Description BLOOD LEFT WRIST  Final   Special Requests   Final    BOTTLES DRAWN AEROBIC AND ANAEROBIC Blood Culture results may not be optimal due to an inadequate volume of blood received in culture bottles   Culture   Final    NO GROWTH 2 DAYS Performed at [facility], [address], [place], [place] [address]    Report Status PENDING  Incomplete  Culture, blood (Routine X 2) w Reflex to ID Panel     Status: None (Preliminary result)   Collection Time: 02/06/22  3:39 PM   Specimen: BLOOD  Result Value Ref Range Status   Specimen Description BLOOD [facility]  Final   Special Requests BOTTLES DRAWN AEROBIC AND ANAEROBIC BCAV  Final   Culture   Final  NO GROWTH 2 DAYS Performed at [facility], [address] [place], [place] [address]    Report Status PENDING  Incomplete    Coagulation Studies: No results for input(s): "LABPROT", "INR" in the last 72 hours.   Urinalysis: No results for input(s): "COLORURINE", "LABSPEC", "PHURINE", "GLUCOSEU", "HGBUR", "BILIRUBINUR", "KETONESUR", "PROTEINUR",  "UROBILINOGEN", "NITRITE", "LEUKOCYTESUR" in the last 72 hours.  Invalid input(s): "APPERANCEUR"     Imaging: PERIPHERAL VASCULAR CATHETERIZATION  Result Date: 01/19/2022 See surgical note for result.    Medications:    sodium chloride Stopped (02/02/22 2010)   albumin human     ceFEPime (MAXIPIME) IV     ceFEPime (MAXIPIME) IV     dexmedetomidine (PRECEDEX) IV infusion 1 mcg/kg/hr (02/08/22 0808)   feeding supplement (VITAL HIGH PROTEIN) 60 mL/hr at 02/08/22 0808   fentaNYL infusion INTRAVENOUS 125 mcg/hr (02/08/22 0808)   heparin 2,100 Units/hr (02/08/22 0808)   vancomycin      alteplase  2 mg Intracatheter Once   budesonide (PULMICORT) nebulizer solution  0.5 mg Nebulization BID   Chlorhexidine Gluconate Cloth  6 each Topical Q0600   clonazePAM  0.5 mg Per Tube BID   docusate  100 mg Per Tube BID   feeding supplement (PROSource TF)  90 mL Per Tube BID   free water  30 mL Per Tube Q4H   insulin aspart  0-6 Units Subcutaneous Q4H   ipratropium-albuterol  3 mL Nebulization Q6H   lidocaine  1 patch Transdermal Q24H   metoprolol tartrate  25 mg Oral BID   multivitamin  1 tablet Per Tube QHS   mouth rinse  15 mL Mouth Rinse Q2H   oxyCODONE  5 mg Per Tube Q6H   pantoprazole sodium  40 mg Per Tube Daily   polyethylene glycol  17 g Per Tube Daily   sodium chloride, acetaminophen, heparin, heparin, lidocaine (PF), lidocaine-prilocaine, midazolam, mouth rinse, mouth rinse, pentafluoroprop-tetrafluoroeth, vancomycin  Assessment/ Plan:  41 y.o. male with   chronic systolic and diastolic CHF, hypertension, anemia, polysubstance abuse (cocaine and tobacco), chronic kidney disease, non-STEMI May 2023, A-fib with RVR admitted on 02/01/22 for Acute respiratory failure ([name]) [J96.00] Acute respiratory failure with hypoxia ([name]) [J96.01] Atrial fibrillation with RVR ([name]) [I48.91] Community acquired pneumonia of right lung, unspecified part of lung [J18.9] Acute on chronic congestive  heart failure, unspecified heart failure type ([name]) [I50.9] Acute respiratory failure with hypoxemia ([name]) [J96.01]  Acute kidney injury on chronic kidney disease stage IIIb. Baseline creatinine 2.03/GFR 42 on November 04, 2021. Renal ultrasound without obstruction.   No IV contrast exposure. AKI likely secondary to cardiorenal syndrome.   CRRT started on June 17 and stopped on June 20.  Patient continues to require hemodialysis, though urine output has increased.  BUN remains elevated.  Patient did receive treatment yesterday  Vascular will place PermCath yesterday.   Next dialysis treatment scheduled for Monday.  Acute respiratory failure Currently ventilator assisted.   Completed antibiotic therapy for CAP  Acute exacerbation of chronic combined systolic and diastolic CHF 2D echo September 05, 2021-severe global reduction in LV function, severe LVH, EF 20 to 25%, global hypokinesis, grade 3 diastolic dysfunction We will attempt to manage fluid via dialysis.  Hyponatremia Secondary to AKI and CKD  Anemia of critical illness Hemoglobin is stable   LOS: 9 Stormie Ventola s [facility] 6/24/20239:21 AM  [facility] [place], [place] [phone]

## 2022-02-09 LAB — CBC WITH DIFFERENTIAL/PLATELET
Abs Immature Granulocytes: 0.05 10*3/uL (ref 0.00–0.07)
Basophils Absolute: 0.1 10*3/uL (ref 0.0–0.1)
Basophils Relative: 1 %
Eosinophils Absolute: 0.2 10*3/uL (ref 0.0–0.5)
Eosinophils Relative: 3 %
HCT: 29.1 % — ABNORMAL LOW (ref 39.0–52.0)
Hemoglobin: 8.9 g/dL — ABNORMAL LOW (ref 13.0–17.0)
Immature Granulocytes: 1 %
Lymphocytes Relative: 16 %
Lymphs Abs: 1.3 10*3/uL (ref 0.7–4.0)
MCH: 20.3 pg — ABNORMAL LOW (ref 26.0–34.0)
MCHC: 30.6 g/dL (ref 30.0–36.0)
MCV: 66.3 fL — ABNORMAL LOW (ref 80.0–100.0)
Monocytes Absolute: 1.1 10*3/uL — ABNORMAL HIGH (ref 0.1–1.0)
Monocytes Relative: 13 %
Neutro Abs: 5.6 10*3/uL (ref 1.7–7.7)
Neutrophils Relative %: 66 %
Platelets: 190 10*3/uL (ref 150–400)
RBC: 4.39 MIL/uL (ref 4.22–5.81)
RDW: 18.1 % — ABNORMAL HIGH (ref 11.5–15.5)
WBC: 8.2 10*3/uL (ref 4.0–10.5)
nRBC: 0 % (ref 0.0–0.2)

## 2022-02-09 LAB — RENAL FUNCTION PANEL
Albumin: 2.5 g/dL — ABNORMAL LOW (ref 3.5–5.0)
Anion gap: 15 (ref 5–15)
BUN: 119 mg/dL — ABNORMAL HIGH (ref 6–20)
CO2: 19 mmol/L — ABNORMAL LOW (ref 22–32)
Calcium: 9.2 mg/dL (ref 8.9–10.3)
Chloride: 99 mmol/L (ref 98–111)
Creatinine, Ser: 7.81 mg/dL — ABNORMAL HIGH (ref 0.61–1.24)
GFR, Estimated: 8 mL/min — ABNORMAL LOW (ref >60)
Glucose, Bld: 143 mg/dL — ABNORMAL HIGH (ref 70–99)
Phosphorus: 11.4 mg/dL — ABNORMAL HIGH (ref 2.5–4.6)
Potassium: 5.1 mmol/L (ref 3.5–5.1)
Sodium: 133 mmol/L — ABNORMAL LOW (ref 135–145)

## 2022-02-09 LAB — HEPARIN LEVEL (UNFRACTIONATED): Heparin Unfractionated: 0.25 IU/mL — ABNORMAL LOW (ref 0.30–0.70)

## 2022-02-09 LAB — GLUCOSE, CAPILLARY
Glucose-Capillary: 150 mg/dL — ABNORMAL HIGH (ref 70–99)
Glucose-Capillary: 120 mg/dL — ABNORMAL HIGH (ref 70–99)
Glucose-Capillary: 109 mg/dL — ABNORMAL HIGH (ref 70–99)
Glucose-Capillary: 121 mg/dL — ABNORMAL HIGH (ref 70–99)
Glucose-Capillary: 99 mg/dL (ref 70–99)
Glucose-Capillary: 102 mg/dL — ABNORMAL HIGH (ref 70–99)

## 2022-02-09 LAB — MAGNESIUM: Magnesium: 2.7 mg/dL — ABNORMAL HIGH (ref 1.7–2.4)

## 2022-02-09 MED ORDER — PROPOFOL 1000 MG/100ML IV EMUL
5.0000 ug/kg/min | INTRAVENOUS | Status: DC
  Administered 2022-02-09: 5 ug/kg/min via INTRAVENOUS
  Administered 2022-02-09 (×4): 30 ug/kg/min via INTRAVENOUS
  Administered 2022-02-10: 40 ug/kg/min via INTRAVENOUS
  Administered 2022-02-10: 45 ug/kg/min via INTRAVENOUS
  Administered 2022-02-10: 40 ug/kg/min via INTRAVENOUS
  Administered 2022-02-10: 45 ug/kg/min via INTRAVENOUS
  Administered 2022-02-10 (×2): 40 ug/kg/min via INTRAVENOUS
  Administered 2022-02-10: 45 ug/kg/min via INTRAVENOUS
  Administered 2022-02-10: 30 ug/kg/min via INTRAVENOUS
  Administered 2022-02-10: 45 ug/kg/min via INTRAVENOUS
  Administered 2022-02-11 (×2): 40 ug/kg/min via INTRAVENOUS
  Administered 2022-02-11: 45 ug/kg/min via INTRAVENOUS
  Administered 2022-02-11 – 2022-02-12 (×7): 40 ug/kg/min via INTRAVENOUS
  Administered 2022-02-12: 45 ug/kg/min via INTRAVENOUS
  Administered 2022-02-12 (×5): 40 ug/kg/min via INTRAVENOUS
  Administered 2022-02-12 (×2): 45 ug/kg/min via INTRAVENOUS
  Administered 2022-02-13: 50 ug/kg/min via INTRAVENOUS
  Administered 2022-02-13: 45 ug/kg/min via INTRAVENOUS
  Administered 2022-02-13: 50 ug/kg/min via INTRAVENOUS
  Administered 2022-02-13: 45 ug/kg/min via INTRAVENOUS
  Administered 2022-02-13: 50 ug/kg/min via INTRAVENOUS
  Administered 2022-02-13: 45 ug/kg/min via INTRAVENOUS
  Administered 2022-02-13: 50 ug/kg/min via INTRAVENOUS
  Administered 2022-02-13: 45 ug/kg/min via INTRAVENOUS
  Administered 2022-02-14: 50 ug/kg/min via INTRAVENOUS
  Administered 2022-02-14: 49.97 ug/kg/min via INTRAVENOUS
  Administered 2022-02-14 (×4): 50 ug/kg/min via INTRAVENOUS
  Administered 2022-02-14: 60 ug/kg/min via INTRAVENOUS
  Administered 2022-02-14: 50 ug/kg/min via INTRAVENOUS
  Administered 2022-02-14: 49.965 ug/kg/min via INTRAVENOUS
  Administered 2022-02-14: 50 ug/kg/min via INTRAVENOUS
  Filled 2022-02-09 (×5): qty 100
  Filled 2022-02-09: qty 200
  Filled 2022-02-09 (×4): qty 100
  Filled 2022-02-09: qty 200
  Filled 2022-02-09 (×3): qty 100
  Filled 2022-02-09: qty 200
  Filled 2022-02-09 (×10): qty 100
  Filled 2022-02-09: qty 200
  Filled 2022-02-09 (×15): qty 100
  Filled 2022-02-09: qty 200
  Filled 2022-02-09 (×3): qty 100

## 2022-02-09 MED ORDER — PROPOFOL 1000 MG/100ML IV EMUL
INTRAVENOUS | Status: AC
  Filled 2022-02-09: qty 100

## 2022-02-09 NOTE — Progress Notes (Addendum)
Patient still severely encephalopathic this shift requiring versed pushes to keep RAAS -1 to -2. Several episodes of Broad complex tachycardia when pt is agitated. Planned about clustering nursing/ RT activities together as this pt is requiring PRN meds every time he is woken up. Discussed with Pharmacy regarding stopping heparin infusion in lieu of Arterial line removal.

## 2022-02-09 NOTE — Progress Notes (Signed)
[facility], [place] 02/09/22  Subjective:   Hospital day # 10  Patient was seen today in ICU Patient remains sedated unable to offer any complaints   06/24 0701 - 06/25 0700 In: 4068.9 [I.V.:1832.2; NG/GT:2136.7; IV Piggyback:100] Out: 1200 [Urine:1200] Lab Results  Component Value Date   CREATININE 7.81 (H) 02/09/2022   CREATININE 6.45 (H) 02/08/2022   CREATININE 7.45 (H) 2022/02/21     Objective:  Vital signs in last 24 hours:  Temp:  [97.2 F (36.2 C)-99.8 F (37.7 C)] 97.3 F (36.3 C) (06/25 1330) Pulse Rate:  [42-104] 80 (06/25 1330) Resp:  [16-33] 20 (06/25 1330) BP: (104-144)/(59-105) 115/83 (06/25 1330) SpO2:  [94 %-100 %] 98 % (06/25 1335) Arterial Line BP: (123-172)/(86-109) 125/89 (06/25 0500) FiO2 (%):  [24 %] 24 % (06/25 1335) Weight:  [142.1 kg] 142.1 kg (06/25 0240)  Weight change: -3.1 kg Filed Weights   02-21-22 2122 02/08/22 0300 02/09/22 0240  Weight: (!) 143.7 kg (!) 141.9 kg (!) 142.1 kg    Intake/Output:    Intake/Output Summary (Last 24 hours) at 02/09/2022 1413 Last data filed at 02/09/2022 1200 Gross per 24 hour  Intake 4458.46 ml  Output 1200 ml  Net 3258.46 ml     Physical Exam: General: Critically ill-appearing, laying in the bed  HEENT ET tube, OG tube in place  Pulm/lungs Ventilator assisted,  rhonchi bilateral Present  CVS/Heart Atrial fibrillation, irregular  Abdomen:  Soft  Extremities: Some dependent edema present  Neurologic: Sedated  Skin: Warm, dry  Access: Right femoral dialysis catheter in place       Basic Metabolic Panel:  Recent Labs  Lab 02/03/22 0412 02/03/22 1535 02/04/22 0412 02/04/22 2056 02/06/22 0338 02/06/22 1539 Feb 21, 2022 0333 02/08/22 0336 02/09/22 0330  NA 135   < > 137   < > 134* 135 135 133* 133*  K 3.6   < > 3.8   < > 4.1 4.3 4.3 4.6 5.1  CL 104   < > 106   < > 101 102 101 97* 99  CO2 24   < > 23   < > 20* 18* 22 23 19*  GLUCOSE 107*   < > 113*   < >  133* 125* 134* 140* 143*  BUN 34*   < > 44*   < > 85* 99* 87* 78* 119*  CREATININE 3.44*   < > 4.26*   < > 7.08* 7.99* 7.45* 6.45* 7.81*  CALCIUM 8.5*   < > 8.3*   < > 8.9 8.9 8.9 9.1 9.2  MG 2.2  --  2.4  --   --   --   --  2.4 2.7*  PHOS 3.8   < > 5.1*   < > 8.2* 9.5* 8.7* 9.1* 11.4*   < > = values in this interval not displayed.     CBC: Recent Labs  Lab 02/03/22 0412 02/04/22 0412 02/05/22 0335 02/06/22 0338 2022/02/21 0333 02/08/22 0336 02/09/22 0330  WBC 6.7   < > 7.4 9.2 10.2 10.1 8.2  NEUTROABS 4.8  --   --   --   --  6.7 5.6  HGB 11.1*   < > 9.9* 10.0* 9.9* 9.5* 8.9*  HCT 36.6*   < > 32.7* 32.5* 32.2* 31.0* 29.1*  MCV 66.8*   < > 66.9* 65.7* 66.0* 66.5* 66.3*  PLT 188   < > 177 181 190 185 190   < > = values in this interval not displayed.  Lab Results  Component Value Date   HEPBSAG NON REACTIVE 02/04/2022   HEPBSAB NON REACTIVE 02/04/2022   HEPBIGM NON REACTIVE 02/02/2022      Microbiology:  Recent Results (from the past 240 hour(s))  Culture, blood (Routine X 2) w Reflex to ID Panel     Status: None (Preliminary result)   Collection Time: 02/06/22  8:34 AM   Specimen: BLOOD  Result Value Ref Range Status   Specimen Description BLOOD LEFT WRIST  Final   Special Requests   Final    BOTTLES DRAWN AEROBIC AND ANAEROBIC Blood Culture results may not be optimal due to an inadequate volume of blood received in culture bottles   Culture   Final    NO GROWTH 3 DAYS Performed at [facility], [address] [place], [place] [address]    Report Status PENDING  Incomplete  Culture, blood (Routine X 2) w Reflex to ID Panel     Status: None (Preliminary result)   Collection Time: 02/06/22  3:39 PM   Specimen: BLOOD  Result Value Ref Range Status   Specimen Description BLOOD [facility]  Final   Special Requests BOTTLES DRAWN AEROBIC AND ANAEROBIC BCAV  Final   Culture   Final    NO GROWTH 3 DAYS Performed at [facility], [address] [place], [place] [address]    Report Status PENDING  Incomplete    Coagulation Studies: No results for input(s): "LABPROT", "INR" in the last 72 hours.   Urinalysis: No results for input(s): "COLORURINE", "LABSPEC", "PHURINE", "GLUCOSEU", "HGBUR", "BILIRUBINUR", "KETONESUR", "PROTEINUR", "UROBILINOGEN", "NITRITE", "LEUKOCYTESUR" in the last 72 hours.  Invalid input(s): "APPERANCEUR"     Imaging: PERIPHERAL VASCULAR CATHETERIZATION  Result Date: 02/09/2022 See surgical note for result.    Medications:    sodium chloride Stopped (02/02/22 2010)   albumin human     ceFEPime (MAXIPIME) IV Stopped (02/08/22 2040)   dexmedetomidine (PRECEDEX) IV infusion Stopped (02/09/22 6213)   feeding supplement (VITAL HIGH PROTEIN) 60 mL/hr at 02/09/22 1200   fentaNYL infusion INTRAVENOUS 200 mcg/hr (02/09/22 1359)   heparin 2,100 Units/hr (02/09/22 1200)   propofol (DIPRIVAN) infusion 30 mcg/kg/min (02/09/22 1200)   propofol     vancomycin      alteplase  2 mg Intracatheter Once   Chlorhexidine Gluconate Cloth  6 each Topical Q0600   docusate  100 mg Per Tube BID   feeding supplement (PROSource TF)  90 mL Per Tube BID   free water  100 mL Per Tube Q4H   insulin aspart  0-6 Units Subcutaneous Q4H   ipratropium-albuterol  3 mL Nebulization Q6H   lidocaine  1 patch Transdermal Q24H   metoprolol tartrate  25 mg Oral BID   multivitamin  1 tablet Per Tube QHS   mouth rinse  15 mL Mouth Rinse Q2H   pantoprazole sodium  40 mg Per Tube Daily   PHENObarbital  60 mg Per Tube TID   polyethylene glycol  17 g Per Tube Daily   thiamine injection  100 mg Intravenous Daily   sodium chloride, acetaminophen, fentaNYL (SUBLIMAZE) injection, heparin, heparin, lidocaine (PF), lidocaine-prilocaine, midazolam, mouth rinse, mouth rinse, pentafluoroprop-tetrafluoroeth, propofol, vancomycin  Assessment/ Plan:  41 y.o. male with   chronic systolic and diastolic CHF, hypertension, anemia, polysubstance abuse  (cocaine and tobacco), chronic kidney disease, non-STEMI May 2023, A-fib with RVR admitted on 01/27/2022 for Acute respiratory failure ([name]) [J96.00] Acute respiratory failure with hypoxia ([name]) [J96.01] Atrial fibrillation with RVR ([name]) [I48.91] Community  acquired pneumonia of right lung, unspecified part of lung [J18.9] Acute on chronic congestive heart failure, unspecified heart failure type ([name]) [I50.9] Acute respiratory failure with hypoxemia ([name]) [J96.01]  Acute kidney injury on chronic kidney disease stage IIIb. Baseline creatinine 2.03/GFR 42 on November 04, 2021. Renal ultrasound without obstruction.   No IV contrast exposure. AKI likely secondary to cardiorenal syndrome.   CRRT started on June 17 and stopped on June 20.  Patient urine output has improved Today it was noticed patient had a high post void residual and catheter was placed But patient's BUN continues to rise and patient continues continued rise Patient did receive treatment on Friday Vascular will place PermCath yesterday.   Next dialysis treatment scheduled for Monday.  Acute respiratory failure Currently ventilator assisted.   Completed antibiotic therapy for CAP  Acute exacerbation of chronic combined systolic and diastolic CHF 2D echo September 05, 2021-severe global reduction in LV function, severe LVH, EF 20 to 25%, global hypokinesis, grade 3 diastolic dysfunction We will attempt to manage fluid via dialysis.  Hyponatremia Secondary to AKI and CKD  Anemia of critical illness Hemoglobin is stable  Acute urinary retention Patient is now s/p indwelling catheter   LOS: 10 Gerald [name] s [facility] 6/25/20232:13 [facility] [place], [place] [phone]

## 2022-02-10 ENCOUNTER — Inpatient Hospital Stay: Payer: Medicaid Other

## 2022-02-10 ENCOUNTER — Encounter: Payer: Self-pay | Admitting: Vascular Surgery

## 2022-02-10 DIAGNOSIS — Z992 Dependence on renal dialysis: Secondary | ICD-10-CM

## 2022-02-10 DIAGNOSIS — Z7189 Other specified counseling: Secondary | ICD-10-CM

## 2022-02-10 LAB — CBC WITH DIFFERENTIAL/PLATELET
Abs Immature Granulocytes: 0.04 10*3/uL (ref 0.00–0.07)
Basophils Absolute: 0.1 10*3/uL (ref 0.0–0.1)
Basophils Relative: 1 %
Eosinophils Absolute: 0.3 10*3/uL (ref 0.0–0.5)
Eosinophils Relative: 4 %
HCT: 26.6 % — ABNORMAL LOW (ref 39.0–52.0)
Hemoglobin: 8 g/dL — ABNORMAL LOW (ref 13.0–17.0)
Immature Granulocytes: 1 %
Lymphocytes Relative: 20 %
Lymphs Abs: 1.5 10*3/uL (ref 0.7–4.0)
MCH: 20.2 pg — ABNORMAL LOW (ref 26.0–34.0)
MCHC: 30.1 g/dL (ref 30.0–36.0)
MCV: 67 fL — ABNORMAL LOW (ref 80.0–100.0)
Monocytes Absolute: 1.5 10*3/uL — ABNORMAL HIGH (ref 0.1–1.0)
Monocytes Relative: 19 %
Neutro Abs: 4.4 10*3/uL (ref 1.7–7.7)
Neutrophils Relative %: 55 %
Platelets: 198 10*3/uL (ref 150–400)
RBC: 3.97 MIL/uL — ABNORMAL LOW (ref 4.22–5.81)
RDW: 18.1 % — ABNORMAL HIGH (ref 11.5–15.5)
WBC: 7.9 10*3/uL (ref 4.0–10.5)
nRBC: 0 % (ref 0.0–0.2)

## 2022-02-10 LAB — GLUCOSE, CAPILLARY
Glucose-Capillary: 91 mg/dL (ref 70–99)
Glucose-Capillary: 110 mg/dL — ABNORMAL HIGH (ref 70–99)
Glucose-Capillary: 108 mg/dL — ABNORMAL HIGH (ref 70–99)
Glucose-Capillary: 120 mg/dL — ABNORMAL HIGH (ref 70–99)
Glucose-Capillary: 130 mg/dL — ABNORMAL HIGH (ref 70–99)
Glucose-Capillary: 126 mg/dL — ABNORMAL HIGH (ref 70–99)

## 2022-02-10 LAB — RENAL FUNCTION PANEL
Albumin: 2.4 g/dL — ABNORMAL LOW (ref 3.5–5.0)
Anion gap: 18 — ABNORMAL HIGH (ref 5–15)
BUN: 136 mg/dL — ABNORMAL HIGH (ref 6–20)
CO2: 19 mmol/L — ABNORMAL LOW (ref 22–32)
Calcium: 9.1 mg/dL (ref 8.9–10.3)
Chloride: 98 mmol/L (ref 98–111)
Creatinine, Ser: 8.75 mg/dL — ABNORMAL HIGH (ref 0.61–1.24)
GFR, Estimated: 7 mL/min — ABNORMAL LOW (ref >60)
Glucose, Bld: 98 mg/dL (ref 70–99)
Phosphorus: >30 mg/dL — ABNORMAL HIGH (ref 2.5–4.6)
Potassium: 4.7 mmol/L (ref 3.5–5.1)
Sodium: 135 mmol/L (ref 135–145)

## 2022-02-10 LAB — HEPATIC FUNCTION PANEL
ALT: 164 U/L — ABNORMAL HIGH (ref 0–44)
AST: 39 U/L (ref 15–41)
Albumin: 2.5 g/dL — ABNORMAL LOW (ref 3.5–5.0)
Alkaline Phosphatase: 88 U/L (ref 38–126)
Bilirubin, Direct: 0.1 mg/dL (ref 0.0–0.2)
Indirect Bilirubin: 0.5 mg/dL (ref 0.3–0.9)
Total Bilirubin: 0.6 mg/dL (ref 0.3–1.2)
Total Protein: 6.9 g/dL (ref 6.5–8.1)

## 2022-02-10 LAB — TRIGLYCERIDES: Triglycerides: 181 mg/dL — ABNORMAL HIGH (ref <150)

## 2022-02-10 LAB — HEPARIN LEVEL (UNFRACTIONATED)
Heparin Unfractionated: 0.34 IU/mL (ref 0.30–0.70)
Heparin Unfractionated: 0.35 IU/mL (ref 0.30–0.70)

## 2022-02-10 LAB — MAGNESIUM: Magnesium: 2.8 mg/dL — ABNORMAL HIGH (ref 1.7–2.4)

## 2022-02-10 LAB — PROCALCITONIN: Procalcitonin: 1.71 ng/mL

## 2022-02-10 MED ORDER — FENTANYL CITRATE (PF) 100 MCG/2ML IJ SOLN
100.0000 ug | Freq: Once | INTRAMUSCULAR | Status: AC
  Administered 2022-02-10: 100 ug via INTRAVENOUS

## 2022-02-10 MED ORDER — ALBUMIN HUMAN 25 % IV SOLN
12.5000 g | Freq: Once | INTRAVENOUS | Status: DC
Start: 2022-02-10 — End: 2022-02-10

## 2022-02-10 MED ORDER — HEPARIN SODIUM (PORCINE) 1000 UNIT/ML IJ SOLN
INTRAMUSCULAR | Status: AC
  Administered 2022-02-10: 2800 [IU] via INTRAVENOUS_CENTRAL
  Filled 2022-02-10: qty 10

## 2022-02-10 MED ORDER — VECURONIUM BROMIDE 10 MG IV SOLR
10.0000 mg | INTRAVENOUS | Status: DC | PRN
  Administered 2022-02-10 – 2022-02-13 (×3): 10 mg via INTRAVENOUS
  Filled 2022-02-10 (×3): qty 10

## 2022-02-10 MED ORDER — VECURONIUM BROMIDE 10 MG IV SOLR
10.0000 mg | Freq: Once | INTRAVENOUS | Status: AC
  Administered 2022-02-10: 10 mg via INTRAVENOUS
  Filled 2022-02-10: qty 10

## 2022-02-10 MED ORDER — ALBUMIN HUMAN 25 % IV SOLN
25.0000 g | Freq: Once | INTRAVENOUS | Status: AC
Start: 2022-02-10 — End: 2022-02-10
  Filled 2022-02-10: qty 100

## 2022-02-10 MED ORDER — LORAZEPAM 2 MG/ML IJ SOLN
2.0000 mg | INTRAMUSCULAR | Status: DC | PRN
  Administered 2022-02-10: 4 mg via INTRAVENOUS
  Filled 2022-02-10 (×2): qty 2

## 2022-02-10 NOTE — Consult Note (Signed)
Consultation Note Date: 02/10/2022   Patient Name: Gerald [name]  DOB: [birth year]  MRN: [id number]  Age / Sex: 41 y.o., male  PCP: Patient, [name] Referring Physician: Flora Lipps, MD  Reason for Consultation: Establishing goals of care  HPI/Patient Profile: Per EMR, 41 y.o. male with history of chronic systolic and diastolic CHF last EF measured was 20 to 25% in January 2023 with history of hypertension, chronic kidney disease stage III baseline creatinine around 2,  anemia polysubstance abuse admits to taking cocaine presents to the ER because of worsening shortness of breath for the last 3 days with no associated chest pain has some productive cough.   Patient states he has not taken his medicines for last 3 days.  Clinical Assessment and Goals of Care: Notes and labs reviewed in detail. Previous admissions reviewed. No family at bedside.. Patient is resting in bed with dialysis in session- frequent alarms. No family at bedside.   Called to speak with patient's mother. She states patient lives with his girlfriend. She tells me she talks to him weekly. She tells me he has a 61 year old daughter, an 29 year old daughter leaving for college, and a 25 year old daughter who just had a baby this past Saturday. Patient's mother advises the patient's daughter's mother died a year ago. She states she worries about them.  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    She has been updated and tells me she is expecting a call about a tracheostomy. We discuss that this is a big decision with a lot of things to be considered. Plans for a family meeting tomorrow at 12:30.     SUMMARY OF RECOMMENDATIONS    Family meeting tomorrow at 12:30 regarding tracheostomy and moving forward.    Prognosis:  Poor overall      Primary Diagnoses: Present on Admission:  Acute  respiratory failure ([name])  Atrial fibrillation with rapid ventricular response ([name])  Acute on chronic combined systolic and diastolic CHF (congestive heart failure) ([name])  Stage 3a chronic kidney disease (CKD) ([name]) - baseline SCr 1.8-2.0  Acute respiratory failure with hypoxemia ([place])   I have reviewed the medical record, interviewed the patient and family, and examined the patient. The following aspects are pertinent.  Past Medical History:  Diagnosis Date   Acute on chronic combined systolic and diastolic CHF (congestive heart failure) ([name])    Acute on chronic systolic CHF (congestive heart failure) ([place]) 21/97/5883   Acute systolic congestive heart failure ([name])    Atrial flutter, paroxysmal ([name])    CHF (congestive heart failure) ([place]) 03/30/2021   Chronic combined systolic and diastolic CHF (congestive heart failure) ([name])    Chronic kidney disease, stage III (moderate) ([name])    Dysrhythmia    brief episode afib, cardioverted did not return   Hypertension    Hypertensive crisis 01/20/2021   Hypertensive emergency 12/21/2020   NSTEMI (non-ST elevated myocardial infarction) ([place])  12/21/2015   Polysubstance abuse ([place])    Social History   Socioeconomic History   Marital status: Single    Spouse name: Not on file   Number of children: 3   Years of education: Not on file   Highest education level: 11th grade  Occupational History   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   Sexual activity: Yes    Partners:  Female    Birth control/protection: Condom  Other Topics Concern   Not on file  Social History Narrative   Not on file   Social Determinants of Health   Financial Resource Strain: High Risk (07/08/2021)   Overall Financial Resource Strain (CARDIA)    Difficulty of Paying Living Expenses: Hard  Food Insecurity: No Food Insecurity (07/08/2021)   Hunger Vital Sign    Worried About Running Out of Food in the Last Year: Never true    Ran Out of Food in the Last Year: Never true  Transportation Needs: No Transportation Needs (07/08/2021)   PRAPARE - Hydrologist (Medical): No    Lack of Transportation (Non-Medical): No  Physical Activity: Not on file  Stress: Not on file  Social Connections: Not on file   Family History  Problem Relation Age of Onset   Hypertension Father    Heart disease Paternal Grandfather    Scheduled Meds:  alteplase  2 mg Intracatheter Once   Chlorhexidine Gluconate Cloth  6 each Topical Q0600   docusate  100 mg Per Tube BID   feeding supplement (PROSource TF)  90 mL Per Tube BID   free water  100 mL Per Tube Q4H   heparin sodium (porcine)       insulin aspart  0-6 Units Subcutaneous Q4H   ipratropium-albuterol  3 mL Nebulization Q6H   lidocaine  1 patch Transdermal Q24H   metoprolol tartrate  25 mg Oral BID   multivitamin  1 tablet Per Tube QHS   pantoprazole sodium  40 mg Per Tube Daily   PHENObarbital  60 mg Per Tube TID   polyethylene glycol  17 g Per Tube Daily   thiamine injection  100 mg Intravenous Daily   Continuous Infusions:  sodium chloride Stopped (02/02/22 2010)   albumin human     albumin human     ceFEPime (MAXIPIME) IV 1 g (02/09/22 1956)   feeding supplement (VITAL HIGH PROTEIN) 1,000 mL (02/10/22 1005)   fentaNYL infusion INTRAVENOUS 200 mcg/hr (02/10/22 1336)   heparin 2,300 Units/hr (02/10/22 0819)   propofol (DIPRIVAN) infusion 30 mcg/kg/min (02/10/22 1517)   PRN Meds:.sodium chloride,  acetaminophen, fentaNYL (SUBLIMAZE) injection, heparin, heparin, heparin sodium (porcine), lidocaine (PF), lidocaine-prilocaine, LORazepam, midazolam, mouth rinse, pentafluoroprop-tetrafluoroeth, vecuronium Medications Prior to Admission:  Prior to Admission medications   Medication Sig Start Date End Date Taking? Authorizing Provider  amLODipine (NORVASC) 5 MG tablet Take 2 tablets (  10 mg total) by mouth once daily. 11/05/21 02/03/22 Yes Enzo Bi, MD  atorvastatin (LIPITOR) 40 MG tablet Take 1 tablet (40 mg total) by mouth once daily. 11/05/21 02/03/22 Yes Enzo Bi, MD  carvedilol (COREG) 12.5 MG tablet Take 1 tablet (12.5 mg total) by mouth 2 (two) times daily with a meal. 11/05/21 02/03/22 Yes Enzo Bi, MD  cyclobenzaprine (FLEXERIL) 5 MG tablet Take 5 mg by mouth 3 (three) times daily as needed. 01/03/22  Yes [provider]  dapagliflozin propanediol (FARXIGA) 10 MG TABS tablet Take 1 tablet (10 mg total) by mouth once daily before breakfast. 11/05/21  Yes Enzo Bi, MD  FEROSUL 325 (65 Fe) MG tablet Take 325 mg by mouth daily. 01/03/22  Yes [provider]  hydrALAZINE (APRESOLINE) 100 MG tablet Take 1 tablet (100 mg total) by mouth 3 (three) times daily. Patient taking differently: Take 50 mg by mouth 3 (three) times daily. 11/05/21 02/03/22 Yes Enzo Bi, MD  losartan (COZAAR) 25 MG tablet Take 1 tablet (25 mg total) by mouth once daily. 11/05/21 02/03/22 Yes Enzo Bi, MD  nicotine (NICODERM CQ - DOSED IN MG/24 HOURS) 21 mg/24hr patch Place 1 patch onto the skin daily. 01/03/22  Yes [provider]  spironolactone (ALDACTONE) 50 MG tablet Take 50 mg by mouth daily. 01/03/22  Yes [provider]  torsemide (DEMADEX) 20 MG tablet Take 1 tablet (20 mg total) by mouth once daily. 11/05/21 02/03/22 Yes Enzo Bi, MD  isosorbide mononitrate (IMDUR) 60 MG 24 hr tablet Take 2 tablets (120 mg total) by mouth once daily. 11/05/21 12/05/21  Enzo Bi, MD  rivaroxaban (XARELTO) 20  MG TABS tablet Take 1 tablet (20 mg total) by mouth once daily with supper. 11/05/21 02/03/22  Enzo Bi, MD  potassium chloride (KLOR-CON M) 10 MEQ tablet Take 1 tablet (10 mEq total) by mouth daily. 09/09/21 11/05/21  Atway, Rayann N, DO   No Known Allergies Review of Systems  Unable to perform ROS   Physical Exam Constitutional:      Comments: Eyes closed. On ventilator.      Vital Signs: BP (!) 86/64   Pulse 60   Temp (!) 97 F (36.1 C)   Resp (!) 21   Ht 6' (1.829 m)   Wt (!) 142.1 kg   SpO2 97%   BMI 42.49 kg/m  Pain Scale: CPOT POSS *See Group Information*:  (Ventilated, continuous sedation) Pain Score: 0-No pain   SpO2: SpO2: 97 % O2 Device:SpO2: 97 % O2 Flow Rate: .O2 Flow Rate (L/min): 45 L/min  IO: Intake/output summary:  Intake/Output Summary (Last 24 hours) at 02/10/2022 1518 Last data filed at 02/10/2022 1040 Gross per 24 hour  Intake 2215.76 ml  Output 1600 ml  Net 615.76 ml    LBM: Last BM Date : 02/05/22 Baseline Weight: Weight: (!) 140 kg Most recent weight: Weight: (!) 142.1 kg     Palliative Assessment/Data: Ventilator    essment and plan.  Signed by: Asencion Gowda, NP   Please contact Palliative Medicine Team phone at [phone] for questions and concerns.  For individual provider: See Shea Evans

## 2022-02-11 ENCOUNTER — Inpatient Hospital Stay: Payer: Medicaid Other

## 2022-02-11 ENCOUNTER — Inpatient Hospital Stay: Payer: Self-pay

## 2022-02-11 LAB — RENAL FUNCTION PANEL
Albumin: 2.8 g/dL — ABNORMAL LOW (ref 3.5–5.0)
Anion gap: 16 — ABNORMAL HIGH (ref 5–15)
BUN: 98 mg/dL — ABNORMAL HIGH (ref 6–20)
CO2: 23 mmol/L (ref 22–32)
Calcium: 8.9 mg/dL (ref 8.9–10.3)
Chloride: 96 mmol/L — ABNORMAL LOW (ref 98–111)
Creatinine, Ser: 6.36 mg/dL — ABNORMAL HIGH (ref 0.61–1.24)
GFR, Estimated: 11 mL/min — ABNORMAL LOW (ref >60)
Glucose, Bld: 107 mg/dL — ABNORMAL HIGH (ref 70–99)
Phosphorus: 9.3 mg/dL — ABNORMAL HIGH (ref 2.5–4.6)
Potassium: 4.8 mmol/L (ref 3.5–5.1)
Sodium: 135 mmol/L (ref 135–145)

## 2022-02-11 LAB — CBC WITH DIFFERENTIAL/PLATELET
Abs Immature Granulocytes: 0.03 10*3/uL (ref 0.00–0.07)
Basophils Absolute: 0.1 10*3/uL (ref 0.0–0.1)
Basophils Relative: 1 %
Eosinophils Absolute: 0.3 10*3/uL (ref 0.0–0.5)
Eosinophils Relative: 4 %
HCT: 26.9 % — ABNORMAL LOW (ref 39.0–52.0)
Hemoglobin: 8.1 g/dL — ABNORMAL LOW (ref 13.0–17.0)
Immature Granulocytes: 0 %
Lymphocytes Relative: 12 %
Lymphs Abs: 0.9 10*3/uL (ref 0.7–4.0)
MCH: 20 pg — ABNORMAL LOW (ref 26.0–34.0)
MCHC: 30.1 g/dL (ref 30.0–36.0)
MCV: 66.6 fL — ABNORMAL LOW (ref 80.0–100.0)
Monocytes Absolute: 0.8 10*3/uL (ref 0.1–1.0)
Monocytes Relative: 12 %
Neutro Abs: 5 10*3/uL (ref 1.7–7.7)
Neutrophils Relative %: 71 %
Platelets: 221 10*3/uL (ref 150–400)
RBC: 4.04 MIL/uL — ABNORMAL LOW (ref 4.22–5.81)
RDW: 17.9 % — ABNORMAL HIGH (ref 11.5–15.5)
WBC: 7 10*3/uL (ref 4.0–10.5)
nRBC: 0 % (ref 0.0–0.2)

## 2022-02-11 LAB — CULTURE, BLOOD (ROUTINE X 2)
Culture: NO GROWTH
Culture: NO GROWTH

## 2022-02-11 LAB — GLUCOSE, CAPILLARY
Glucose-Capillary: 103 mg/dL — ABNORMAL HIGH (ref 70–99)
Glucose-Capillary: 109 mg/dL — ABNORMAL HIGH (ref 70–99)
Glucose-Capillary: 115 mg/dL — ABNORMAL HIGH (ref 70–99)
Glucose-Capillary: 106 mg/dL — ABNORMAL HIGH (ref 70–99)
Glucose-Capillary: 107 mg/dL — ABNORMAL HIGH (ref 70–99)
Glucose-Capillary: 96 mg/dL (ref 70–99)

## 2022-02-11 LAB — PROCALCITONIN: Procalcitonin: 1.2 ng/mL

## 2022-02-11 LAB — HEPARIN LEVEL (UNFRACTIONATED): Heparin Unfractionated: 0.32 IU/mL (ref 0.30–0.70)

## 2022-02-11 LAB — MAGNESIUM: Magnesium: 2.8 mg/dL — ABNORMAL HIGH (ref 1.7–2.4)

## 2022-02-11 MED ORDER — SODIUM CHLORIDE 0.9% FLUSH: 10.0000 mL | INTRAVENOUS | Status: DC | PRN

## 2022-02-11 MED ORDER — ORAL CARE MOUTH RINSE: 15.0000 mL | OROMUCOSAL | Status: DC | PRN

## 2022-02-11 MED ORDER — METOPROLOL TARTRATE 25 MG PO TABS
25.0000 mg | ORAL_TABLET | Freq: Four times a day (QID) | ORAL | Status: DC
  Administered 2022-02-11 – 2022-02-13 (×7): 25 mg via ORAL
  Filled 2022-02-11 (×7): qty 1

## 2022-02-11 MED ORDER — SODIUM CHLORIDE 0.9% FLUSH
10.0000 mL | Freq: Two times a day (BID) | INTRAVENOUS | Status: DC
  Administered 2022-02-11 – 2022-02-14 (×8): 10 mL

## 2022-02-11 MED ORDER — ORAL CARE MOUTH RINSE
15.0000 mL | OROMUCOSAL | Status: DC
  Administered 2022-02-11 – 2022-02-14 (×34): 15 mL via OROMUCOSAL

## 2022-02-11 NOTE — Progress Notes (Addendum)
Daily Progress Note   Patient Name: Gerald [name]       Date: 02/11/2022 DOB: 10/03/1980  Age: 41 y.o. MRN#: [id number] Attending Physician: Erin Fulling, MD Primary Care Physician: Patient, [name] Admit Date: 01/30/2022  Reason for Consultation/Follow-up: Establishing goals of care  Subjective: Notes and labs reviewed.  Patient is currently undergoing PICC exchange.  Met with mother in the waiting room.  We discussed his status.     We discussed his diagnoses both acute and chronic, prognosis, GOC, EOL wishes disposition and options.  Created space and opportunity for patient  to explore thoughts and feelings regarding current medical information.   A detailed discussion was had today regarding advanced directives.  Concepts specific to code status, artifical feeding and hydration, IV antibiotics and rehospitalization were discussed.  The difference between an aggressive medical intervention path and a comfort care path was discussed.  Values and goals of care important to patient and family were attempted to be elicited.  Discussed limitations of medical interventions to prolong quality of life in some situations and discussed the concept of human mortality.  Educated her on natural trajectory of illnesses, and expectations.  Questions and concerns addressed.  We discussed his hospitalizations over the past 2 years.  She states she has tried hard to get him to stop using cocaine.  She states he is an adult and will do what he wants regardless of anyone else.  She is considering comfort care.  Stepped out to give her time. Returned to speak with her. She tells me she is a woman of faith. We discussed that he survived a PEA arrest. She feels there are reasons for this. She states she  understands his very poor prognosis in the best scenario but wants to give God time to work with him. She states she does not want long term ventilator support, but would want a tracheostomy to provide a bit more time. She states she would not want CPR. We discuss the daughters and decision making. She states the oldest daughter is still in the hospital from having a baby. She states his second oldest just turned 74. Plans for meeting tomorrow around 12:30 or 1:00 with mother at bedside, and the 2 children in person or on phone.   Spoke with CCM to update. Discussed  doing wake up assessment and SBT tomorrow to see how he does and go from there. Returned to bedside to update mother on plans for tomorrow and answer questions.      Length of Stay: 12  Current Medications: Scheduled Meds:   alteplase  2 mg Intracatheter Once   Chlorhexidine Gluconate Cloth  6 each Topical Q0600   docusate  100 mg Per Tube BID   feeding supplement (PROSource TF)  90 mL Per Tube BID   free water  100 mL Per Tube Q4H   insulin aspart  0-6 Units Subcutaneous Q4H   ipratropium-albuterol  3 mL Nebulization Q6H   lidocaine  1 patch Transdermal Q24H   metoprolol tartrate  25 mg Oral Q6H   multivitamin  1 tablet Per Tube QHS   pantoprazole sodium  40 mg Per Tube Daily   PHENObarbital  60 mg Per Tube TID   polyethylene glycol  17 g Per Tube Daily   sodium chloride flush  10-40 mL Intracatheter Q12H   thiamine injection  100 mg Intravenous Daily    Continuous Infusions:  sodium chloride Stopped (02/02/22 2010)   feeding supplement (VITAL HIGH PROTEIN) Stopped (02/11/22 1239)   fentaNYL infusion INTRAVENOUS 150 mcg/hr (02/11/22 0855)   heparin 2,300 Units/hr (02/11/22 0800)   propofol (DIPRIVAN) infusion 45 mcg/kg/min (02/11/22 1251)    PRN Meds: sodium chloride, acetaminophen, fentaNYL (SUBLIMAZE) injection, heparin, heparin, lidocaine (PF), lidocaine-prilocaine, LORazepam, midazolam, mouth rinse,  pentafluoroprop-tetrafluoroeth, sodium chloride flush, vecuronium  Physical Exam Constitutional:      Comments: Eyes closed.  Pulmonary:     Comments: On ventilator. Skin:    General: Skin is warm and dry.             Vital Signs: BP 118/88   Pulse (!) 110   Temp 97.7 F (36.5 C) (Axillary)   Resp (!) 22   Ht 6' (1.829 m)   Wt (!) 147.8 kg   SpO2 97%   BMI 44.19 kg/m  SpO2: SpO2: 97 % O2 Device: O2 Device: Ventilator O2 Flow Rate: O2 Flow Rate (L/min): 45 L/min  Intake/output summary:  Intake/Output Summary (Last 24 hours) at 02/11/2022 1350 Last data filed at 02/11/2022 0800 Gross per 24 hour  Intake 3567.58 ml  Output 2575 ml  Net 992.58 ml   LBM: Last BM Date : 02/05/22 Baseline Weight: Weight: (!) 140 kg Most recent weight: Weight: (!) 147.8 kg     Patient Active Problem List   Diagnosis Date Noted   ESRD (end stage renal disease) on dialysis [facility])    Encounter for continuous renal replacement therapy (CRRT) for acute renal failure ([name])    Acute respiratory failure ([name]) 2022-02-28   Acute respiratory failure with hypoxemia ([name]) 02/28/2022   Atrial fibrillation with rapid ventricular response ([name]) 11/02/2021   Severe uncontrolled hypertension 09/05/2021   Restless leg 07/08/2021   Acute on chronic congestive heart failure ([name]) 03/30/2021   Cocaine abuse ([name]) 01/20/2021   Obesity, Class III, BMI 40-49.9 (morbid obesity) ([name]) 01/20/2021   Hypertensive emergency 12/21/2020   Stage 3a chronic kidney disease (CKD) ([name]) - baseline SCr 1.8-2.0 12/21/2020   PAF (paroxysmal atrial fibrillation) ([name]) 07/05/2020   Elevated troponin 07/05/2020   Microcytic anemia 07/05/2020   Hypokalemia 07/05/2020   Acute on chronic combined systolic and diastolic CHF (congestive heart failure) ([name])    Atrial flutter ([name])    Essential hypertension    Acute respiratory distress 04/01/2019   Community acquired pneumonia of right lung 12/21/2015  Palliative Care  Assessment & Plan    Recommendations/Plan: Patient's mother is considering her wishes.    Code Status:    Code Status Orders  (From admission, onward)           Start     Ordered   02-13-2022 0432  Full code  Continuous        2022-02-13 0432           Code Status History     Date Active Date Inactive Code Status Order ID Comments User Context   11/02/2021 0618 11/05/2021 2133 Full Code [id number]  Carollee Herter, DO ED   09/05/2021 1100 09/09/2021 2218 Full Code [id number]  Dellia Cloud, MD ED   07/07/2021 1324 07/07/2021 1325 Full Code [id number]  Jonah Blue, MD ED   07/07/2021 1324 07/07/2021 1324 Full Code [id number]  Jonah Blue, MD ED   05/30/2021 1110 06/03/2021 2129 Full Code [id number]  Jonah Blue, MD ED   03/30/2021 1254 04/02/2021 2219 Full Code [id number]  Emeline General, MD ED   01/20/2021 1204 01/23/2021 1754 Full Code [id number]  Jonah Blue, MD ED   12/21/2020 1243 12/24/2020 2054 Full Code [id number]  Danford, Earl Lites, MD Inpatient   07/05/2020 0539 07/07/2020 2134 Full Code [id number]  Briscoe Deutscher, MD ED   04/01/2019 0949 04/09/2019 1527 Full Code [id number]  Auburn Bilberry, MD ED   12/21/2015 1501 12/23/2015 1607 Full Code [id number]  Shaune Pollack, MD Inpatient       Prognosis: Poor overall   Thank you for allowing the Palliative Medicine Team to assist in the care of this patient.   Morton Stall, NP  Please contact Palliative Medicine Team phone at [phone] for questions and concerns.

## 2022-02-11 NOTE — Progress Notes (Signed)
Progress Note  Patient Name: Gerald [name] Date of Encounter: 02/11/2022  [facility] Cardiologist: Julien Nordmann, MD   Subjective   Patient remains intubated and sedated.  Inpatient Medications    Scheduled Meds:  alteplase  2 mg Intracatheter Once   Chlorhexidine Gluconate Cloth  6 each Topical Q0600   docusate  100 mg Per Tube BID   feeding supplement (PROSource TF)  90 mL Per Tube BID   free water  100 mL Per Tube Q4H   insulin aspart  0-6 Units Subcutaneous Q4H   ipratropium-albuterol  3 mL Nebulization Q6H   lidocaine  1 patch Transdermal Q24H   metoprolol tartrate  25 mg Oral BID   multivitamin  1 tablet Per Tube QHS   pantoprazole sodium  40 mg Per Tube Daily   PHENObarbital  60 mg Per Tube TID   polyethylene glycol  17 g Per Tube Daily   thiamine injection  100 mg Intravenous Daily   Continuous Infusions:  sodium chloride Stopped (02/02/22 2010)   ceFEPime (MAXIPIME) IV Stopped (02/10/22 2022)   feeding supplement (VITAL HIGH PROTEIN) 60 mL/hr at 02/11/22 0800   fentaNYL infusion INTRAVENOUS 150 mcg/hr (02/11/22 0855)   heparin 2,300 Units/hr (02/11/22 0800)   propofol (DIPRIVAN) infusion 40 mcg/kg/min (02/11/22 0800)   PRN Meds: sodium chloride, acetaminophen, fentaNYL (SUBLIMAZE) injection, heparin, heparin, lidocaine (PF), lidocaine-prilocaine, LORazepam, midazolam, mouth rinse, pentafluoroprop-tetrafluoroeth, vecuronium   Vital Signs    Vitals:   02/11/22 0723 02/11/22 0730 02/11/22 0745 02/11/22 0831  BP:  112/64 120/74 118/88  Pulse:  93 91 (!) 110  Resp:  20 (!) 22   Temp:      TempSrc:      SpO2: 99% 99% 100%   Weight:      Height:        Intake/Output Summary (Last 24 hours) at 02/11/2022 0941 Last data filed at 02/11/2022 0800 Gross per 24 hour  Intake 3567.58 ml  Output 2925 ml  Net 642.58 ml      02/11/2022    4:21 AM 02/10/2022    5:45 PM 02/10/2022    1:47 PM  Last 3 Weights  Weight (lbs) 325 lb 13.4 oz 325 lb 6.4 oz 328  lb 7.8 oz  Weight (kg) 147.8 kg 147.6 kg 149 kg      Telemetry    Atrial fibrillation with a rate of 90-125 bpm - Personally Reviewed  ECG    02/10/2022: Atrial fibrillation with left bundle branch block - Personally Reviewed  Physical Exam   GEN: Intubated and sedated.  Appears comfortable. Neck: Unable to assess JVP due to body habitus. Cardiac: Distant heart sounds.  Irregularly irregular. Respiratory: Clear anteriorly. GI: Soft, nontender, non-distended  MS: Trace pretibial edema bilaterally; No deformity. Neuro: Intubated and sedated. Psych: Intubated and sedated.  Labs    High Sensitivity Troponin:   Recent Labs  Lab Mar 01, 2022 0023 03/01/22 0153 Mar 01, 2022 0537 03/01/2022 0638  TROPONINIHS 553* 472* 500* 450*     Chemistry Recent Labs  Lab 02/06/22 0338 02/06/22 1539 02/09/22 0330 02/10/22 0329 02/11/22 0330  NA 134*   < > 133* 135 135  K 4.1   < > 5.1 4.7 4.8  CL 101   < > 99 98 96*  CO2 20*   < > 19* 19* 23  GLUCOSE 133*   < > 143* 98 107*  BUN 85*   < > 119* 136* 98*  CREATININE 7.08*   < > 7.81* 8.75*  6.36*  CALCIUM 8.9   < > 9.2 9.1 8.9  MG  --    < > 2.7* 2.8* 2.8*  PROT 7.3  --   --  6.9  --   ALBUMIN 2.7*   < > 2.5* 2.4*  2.5* 2.8*  AST 172*  --   --  39  --   ALT 652*  --   --  164*  --   ALKPHOS 137*  --   --  88  --   BILITOT 0.4  --   --  0.6  --   GFRNONAA 9*   < > 8* 7* 11*  ANIONGAP 13   < > 15 18* 16*   < > = values in this interval not displayed.    Lipids  Recent Labs  Lab 02/10/22 0329  TRIG 181*    Hematology Recent Labs  Lab 02/09/22 0330 02/10/22 0329 02/11/22 0330  WBC 8.2 7.9 7.0  RBC 4.39 3.97* 4.04*  HGB 8.9* 8.0* 8.1*  HCT 29.1* 26.6* 26.9*  MCV 66.3* 67.0* 66.6*  MCH 20.3* 20.2* 20.0*  MCHC 30.6 30.1 30.1  RDW 18.1* 18.1* 17.9*  PLT 190 198 221   Thyroid No results for input(s): "TSH", "FREET4" in the last 168 hours.  BNPNo results for input(s): "BNP", "PROBNP" in the last 168 hours.  DDimer No results  for input(s): "DDIMER" in the last 168 hours.   Radiology    DG Abd 1 View  Result Date: 02/10/2022 CLINICAL DATA:  Orogastric tube placement EXAM: ABDOMEN - 1 VIEW COMPARISON:  None Available. FINDINGS: Limited radiograph of the lower chest and upper abdomen was obtained for the purposes of enteric tube localization. Enteric tube is seen coursing below the diaphragm with distal tip and side port terminating within the expected location of the gastric body. IMPRESSION: Enteric tube with distal tip and side port terminating within the expected location of the gastric body. Electronically Signed   By: Duanne Guess D.O.   On: 02/10/2022 19:11   DG Chest Port 1 View  Result Date: 02/10/2022 CLINICAL DATA:  ET tube, OG tube placement EXAM: PORTABLE CHEST 1 VIEW COMPARISON:  02/10/2022 FINDINGS: Endotracheal tube and NG tube remain in place, unchanged. Left central line tip at the confluence of the innominate veins. Right dialysis catheter tips in the right atrium. Cardiomegaly with vascular congestion. Bibasilar atelectasis. No overt edema. IMPRESSION: Cardiomegaly, vascular congestion.  Bibasilar atelectasis. Electronically Signed   By: Charlett Nose M.D.   On: 02/10/2022 19:10   DG Chest 1 View  Result Date: 02/10/2022 CLINICAL DATA:  Shortness this of breath EXAM: CHEST  1 VIEW COMPARISON:  Portable exam 1059 hours compared to 02/06/2022 FINDINGS: Tip of endotracheal tube projects 3.4 cm above carina. LEFT jugular central venous catheter tip projects over LEFT brachiocephalic vein. RIGHT jugular central venous catheter tip projects over RIGHT atrium. Enlargement of cardiac silhouette with pulmonary vascular congestion. BILATERAL mild perihilar infiltrates likely pulmonary edema. Minimal RIGHT basilar atelectasis. No pneumothorax. IMPRESSION: Enlargement of cardiac silhouette with pulmonary vascular congestion and probable mild pulmonary edema. RIGHT basilar atelectasis. Electronically Signed   By:  Ulyses Southward M.D.   On: 02/10/2022 11:25    Cardiac Studies   TTE (09/05/2021):  1. Severe global reduction in LV function; severe LVH; suggest cardiac  MRI to R/O infiltrative cardiomyopathy or HCM.   2. Left ventricular ejection fraction, by estimation, is 20 to 25%. The  left ventricle has severely decreased function. The left ventricle  demonstrates global hypokinesis. The left ventricular internal cavity size  was moderately dilated. There is severe   left ventricular hypertrophy. Left ventricular diastolic parameters are  consistent with Grade III diastolic dysfunction (restrictive).   3. Right ventricular systolic function is normal. The right ventricular  size is normal. There is mildly elevated pulmonary artery systolic  pressure.   4. Left atrial size was severely dilated.   5. A small pericardial effusion is present.   6. The mitral valve is normal in structure. Trivial mitral valve  regurgitation. No evidence of mitral stenosis.   7. The aortic valve is tricuspid. Aortic valve regurgitation is not  visualized. No aortic stenosis is present.   8. Aortic dilatation noted. There is mild dilatation of the aortic root,  measuring 40 mm. There is mild dilatation of the ascending aorta,  measuring 40 mm.   9. The inferior vena cava is normal in size with greater than 50%  respiratory variability, suggesting right atrial pressure of 3 mmHg.   Patient Profile     41 y.o. male with a history of nonischemic cardiomyopathy, polysubstance abuse, obesity, who presented for worsening shortness of breath and respiratory failure leading to a PEA arrest status post CPR and given epi with ROSC, who is being seen for nonischemic cardiomyopathy and atrial fibrillation RVR.  Assessment & Plan    Atrial fibrillation: Ventricular rates upper normal to mildly elevated. -Increase metoprolol to tartrate to 25 mg every 6 hours. -Continue heparin infusion.  Acute on chronic HFrEF due to  nonischemic cardiomyopathy: -Repeat limited echo to reassess LVEF. -Increase metoprolol, as above.  Plan to transition to evidence-based beta-blocker when clinical condition improves. -Unable to challenge with additional GDMT given soft blood pressure and renal failure at this time. -Fluid removal per nephrology via HD.  Acute respiratory failure with hypoxia: Etiology is likely multifactorial including heart failure and possible pneumonia. -Continue vent management and weaning per CCM.  ESRD: -Per nephrology.   For questions or updates, please contact [facility] Please consult [email or web address] for contact info under [facility].     Signed, Yvonne Kendall, MD  02/11/2022, 9:41 AM

## 2022-02-11 NOTE — Progress Notes (Signed)
Received order for PICC insertion, okay to insert PICC per [facility] .

## 2022-02-11 NOTE — Progress Notes (Signed)
During pt CHG bath pt began vomiting, RN placed OGT to suction total of 1200 ml evacuated. RN notified NP Delton See, tube feeds held, OGT placed to Intermittent LWS, and orders received for KUB.

## 2022-02-12 ENCOUNTER — Inpatient Hospital Stay: Payer: Medicaid Other

## 2022-02-12 ENCOUNTER — Inpatient Hospital Stay (HOSPITAL_COMMUNITY)
Admit: 2022-02-12 | Discharge: 2022-02-12 | Disposition: A | Discharge status: Home / Self Care | Payer: Medicaid Other | Attending: Cardiology | Admitting: Cardiology

## 2022-02-12 DIAGNOSIS — I5021 Acute systolic (congestive) heart failure: Secondary | ICD-10-CM

## 2022-02-12 LAB — BLOOD GAS, ARTERIAL
Acid-base deficit: 2.8 mmol/L — ABNORMAL HIGH (ref 0.0–2.0)
Acid-base deficit: 2.6 mmol/L — ABNORMAL HIGH (ref 0.0–2.0)
Bicarbonate: 23.2 mmol/L (ref 20.0–28.0)
Bicarbonate: 24.5 mmol/L (ref 20.0–28.0)
FIO2: 100 %
FIO2: 100 %
MECHVT: 550 mL
Mechanical Rate: 20
O2 Saturation: 100 %
O2 Saturation: 96.2 %
PEEP: 15 cmH2O
Patient temperature: 38.2
Patient temperature: 37
pCO2 arterial: 46 mmHg (ref 32–48)
pCO2 arterial: 51 mmHg — ABNORMAL HIGH (ref 32–48)
pH, Arterial: 7.31 — ABNORMAL LOW (ref 7.35–7.45)
pH, Arterial: 7.29 — ABNORMAL LOW (ref 7.35–7.45)
pO2, Arterial: 217 mmHg — ABNORMAL HIGH (ref 83–108)
pO2, Arterial: 84 mmHg (ref 83–108)

## 2022-02-12 LAB — CBC WITH DIFFERENTIAL/PLATELET
Abs Immature Granulocytes: 0.03 10*3/uL (ref 0.00–0.07)
Basophils Absolute: 0.1 10*3/uL (ref 0.0–0.1)
Basophils Relative: 1 %
Eosinophils Absolute: 0.4 10*3/uL (ref 0.0–0.5)
Eosinophils Relative: 6 %
HCT: 23.9 % — ABNORMAL LOW (ref 39.0–52.0)
Hemoglobin: 7.8 g/dL — ABNORMAL LOW (ref 13.0–17.0)
Immature Granulocytes: 1 %
Lymphocytes Relative: 18 %
Lymphs Abs: 1 10*3/uL (ref 0.7–4.0)
MCH: 21.5 pg — ABNORMAL LOW (ref 26.0–34.0)
MCHC: 32.6 g/dL (ref 30.0–36.0)
MCV: 66 fL — ABNORMAL LOW (ref 80.0–100.0)
Monocytes Absolute: 0.7 10*3/uL (ref 0.1–1.0)
Monocytes Relative: 13 %
Neutro Abs: 3.5 10*3/uL (ref 1.7–7.7)
Neutrophils Relative %: 61 %
Platelets: 264 10*3/uL (ref 150–400)
RBC: 3.62 MIL/uL — ABNORMAL LOW (ref 4.22–5.81)
RDW: 18.2 % — ABNORMAL HIGH (ref 11.5–15.5)
Smear Review: NORMAL
WBC: 5.6 10*3/uL (ref 4.0–10.5)
nRBC: 0 % (ref 0.0–0.2)

## 2022-02-12 LAB — GLUCOSE, CAPILLARY
Glucose-Capillary: 100 mg/dL — ABNORMAL HIGH (ref 70–99)
Glucose-Capillary: 101 mg/dL — ABNORMAL HIGH (ref 70–99)
Glucose-Capillary: 103 mg/dL — ABNORMAL HIGH (ref 70–99)
Glucose-Capillary: 113 mg/dL — ABNORMAL HIGH (ref 70–99)
Glucose-Capillary: 114 mg/dL — ABNORMAL HIGH (ref 70–99)
Glucose-Capillary: 93 mg/dL (ref 70–99)

## 2022-02-12 LAB — ECHOCARDIOGRAM LIMITED
Calc EF: 41.3 %
Height: 72 in
S' Lateral: 5.3 cm
Single Plane A2C EF: 38.9 %
Single Plane A4C EF: 37 %
Weight: 5238.13 oz

## 2022-02-12 LAB — RENAL FUNCTION PANEL
Albumin: 2.6 g/dL — ABNORMAL LOW (ref 3.5–5.0)
Anion gap: 16 — ABNORMAL HIGH (ref 5–15)
BUN: 118 mg/dL — ABNORMAL HIGH (ref 6–20)
CO2: 23 mmol/L (ref 22–32)
Calcium: 8.9 mg/dL (ref 8.9–10.3)
Chloride: 95 mmol/L — ABNORMAL LOW (ref 98–111)
Creatinine, Ser: 6.98 mg/dL — ABNORMAL HIGH (ref 0.61–1.24)
GFR, Estimated: 9 mL/min — ABNORMAL LOW (ref >60)
Glucose, Bld: 91 mg/dL (ref 70–99)
Phosphorus: 9.4 mg/dL — ABNORMAL HIGH (ref 2.5–4.6)
Potassium: 4.3 mmol/L (ref 3.5–5.1)
Sodium: 134 mmol/L — ABNORMAL LOW (ref 135–145)

## 2022-02-12 LAB — MAGNESIUM: Magnesium: 2.8 mg/dL — ABNORMAL HIGH (ref 1.7–2.4)

## 2022-02-12 LAB — HEPARIN LEVEL (UNFRACTIONATED)
Heparin Unfractionated: 0.22 IU/mL — ABNORMAL LOW (ref 0.30–0.70)
Heparin Unfractionated: 0.34 IU/mL (ref 0.30–0.70)

## 2022-02-12 MED ORDER — BISACODYL 10 MG RE SUPP: 10.0000 mg | Freq: Once | RECTAL | Status: DC

## 2022-02-12 MED ORDER — BISACODYL 10 MG RE SUPP
10.0000 mg | Freq: Every day | RECTAL | Status: DC | PRN
  Administered 2022-02-12: 10 mg via RECTAL
  Filled 2022-02-12: qty 1

## 2022-02-12 MED ORDER — STERILE WATER FOR INJECTION IJ SOLN
INTRAMUSCULAR | Status: AC
  Filled 2022-02-12: qty 10

## 2022-02-12 MED ORDER — FUROSEMIDE 10 MG/ML IJ SOLN
180.0000 mg | Freq: Once | INTRAMUSCULAR | Status: AC
  Administered 2022-02-12: 180 mg via INTRAVENOUS
  Filled 2022-02-12: qty 18

## 2022-02-12 MED ORDER — HEPARIN BOLUS VIA INFUSION
1600.0000 [IU] | Freq: Once | INTRAVENOUS | Status: AC
  Administered 2022-02-12: 1600 [IU] via INTRAVENOUS
  Filled 2022-02-12: qty 1600

## 2022-02-12 MED ORDER — HEPARIN SODIUM (PORCINE) 1000 UNIT/ML IJ SOLN
INTRAMUSCULAR | Status: AC
  Filled 2022-02-12: qty 10

## 2022-02-12 MED ORDER — NOREPINEPHRINE 16 MG/250ML-% IV SOLN
0.0000 ug/min | INTRAVENOUS | Status: DC
  Administered 2022-02-12: 2 ug/min via INTRAVENOUS
  Filled 2022-02-12: qty 250

## 2022-02-12 NOTE — Progress Notes (Signed)
Nutrition Follow-up  DOCUMENTATION CODES:   Obesity unspecified  INTERVENTION:   -Once medically able, continue TF via OGT:    Vital High Protein @ 40 ml/hr and increase by 10 ml every 4 hours to goal rate of 60 ml/hr.    90 ml Prosource TF BID   30 ml free water flush every 4 hours to maintain tube patency   Tube feeding regimen provides 1600 kcal (100% of needs), 170 grams of protein, and 1203 ml of H2O.  Total free water: 1383 ml daily   -Renal MVI daily via tube  NUTRITION DIAGNOSIS:   Inadequate oral intake related to inability to eat (pt sedated and ventilated) as evidenced by NPO status.  Ongoing  GOAL:   Provide needs based on ASPEN/SCCM guidelines  Progressing   MONITOR:   Vent status, Labs, Weight trends, TF tolerance, Skin, I & O's  REASON FOR ASSESSMENT:   Ventilator    ASSESSMENT:   41 y/o male with h/o Afib, CKD, HTN, substance abuse, CAD, NSTEMI and CHF who is admitted with PNA, sepsis, CHF and PEA arrest.  Patient is currently intubated on ventilator support MV: 10.8 L/min Temp (24hrs), Avg:97.4 F (36.3 C), Min:95.7 F (35.4 C), Max:98.2 F (36.8 C)  Propofol: 34.1 ml/hr (proivdes 900 kcal daily)  Reviewed I/O's: -1 L x 24 hours and +10.8 L since admission  UOP: 1.3 L x 24 hours  NGT output: 2.1 L x 24 hours  Case discussed with RN, MD, and during ICU rounds. Pt not on pressors and having improved UOP via foley (600 ml).   Pt had some abdominal distention and NGT was placed to suction- pt has 1200 ml output last night. MD requesting to continue to hold TF for now. NP also considering reglan secondary to constipation (last BM 02/05/22).   Palliative care following for goals of care discussions; plan for wake-up assessment and further goals of care discussions with family this afternoon. PCCM recommending continue HD and trach placement if aggressive care is warranted.   Medications reviewed and include colace, dulcoax, miralax,  thiamine, and fentanyl.   Labs reviewed: Na:: 134, CBGS: 93-100 (inpatient orders for glycemic control are 0-6 units insulin aspart every 4 hours).    Diet Order:   Diet Order             Diet NPO time specified Except for: Sips with Meds  Diet effective ____                   EDUCATION NEEDS:   No education needs have been identified at this time  Skin:  Skin Assessment: Reviewed RN Assessment  Last BM:  02/05/22  Height:   Ht Readings from Last 1 Encounters:  02/08/2022 6' (1.829 m)    Weight:   Wt Readings from Last 1 Encounters:  02/12/22 (!) 148.5 kg    Ideal Body Weight:  80.9 kg  BMI:  Body mass index is 44.4 kg/m.  Estimated Nutritional Needs:   Kcal:  1446-1841kcal/day  Protein:  >160g/day  Fluid:  2.5-2.8L/day    Loistine Chance, RD, LDN, [place] Registered Dietitian II Certified Diabetes Care and Education Specialist Please refer to [facility] for RD and/or RD on-call/weekend/after hours pager

## 2022-02-12 NOTE — Progress Notes (Signed)
ANTICOAGULATION CONSULT NOTE   Pharmacy Consult for Heparin Infusion Indication: ACS/STEMI/atrial fibrillation  Patient Measurements: Height: 6' (182.9 cm) Weight: (!) 148.5 kg (327 lb 6.1 oz) IBW/kg (Calculated) : 77.6 Heparin Dosing Weight: 109.9 kg  Labs: Recent Labs    02/10/22 0329 02/10/22 0957 02/11/22 0330 02/11/22 0615 02/12/22 0503  HGB 8.0*  --  8.1*  --  7.8*  HCT 26.6*  --  26.9*  --  23.9*  PLT 198  --  221  --  264  HEPARINUNFRC 0.34 0.35  --  0.32 0.22*  CREATININE 8.75*  --  6.36*  --   --      Estimated Creatinine Clearance: 22.9 mL/min (A) (by C-G formula based on SCr of 6.36 mg/dL (H)).   Medical History: Past Medical History:  Diagnosis Date   Acute on chronic combined systolic and diastolic CHF (congestive heart failure) ([name])    Acute on chronic systolic CHF (congestive heart failure) ([place]) 28/31/5176   Acute systolic congestive heart failure ([name])    Atrial flutter, paroxysmal ([name])    CHF (congestive heart failure) ([place]) 03/30/2021   Chronic combined systolic and diastolic CHF (congestive heart failure) ([name])    Chronic kidney disease, stage III (moderate) ([name])    Dysrhythmia    brief episode afib, cardioverted did not return   Hypertension    Hypertensive crisis 01/20/2021   Hypertensive emergency 12/21/2020   NSTEMI (non-ST elevated myocardial infarction) ([place]) 12/21/2015   Polysubstance abuse ([place])     Medications:  PTA meds include Xarelto 20 mg daily.  Last dose unknown.    Assessment: 41 y/o male with h/o Afib, CKD, HTN, substance abuse, CAD, NSTEMI and CHF who is admitted with PNA, sepsis, CHF and PEA arrest. Patient with bleeding from femoral site following removal of trialysis catheter ~heparin drip held and PAD device in place ~bleeding currently appears to have subsided and heparin has now been restarted  Goal of Therapy:  Heparin level 0.3-0.7 units/ml Monitor platelets by anticoagulation protocol: Yes  Plan:  HL  subtherapeutic: 0.22 @ 0503 Bolus 1600 units x 1 Increase heparin infusion to 2500 units/hr Recheck HL in 8 hr after rate change Daily CBC while on IV heparin  Renda Rolls, PharmD, [facility] 02/12/2022 5:53 AM

## 2022-02-12 NOTE — Progress Notes (Signed)
[place], [place] 02/12/22  Subjective:   Hospital day # 13  Patient critically ill. Sedation with fentanyl and propofol  cvs: No pressors, heparin drip pulm: Ventilator assisted.  FiO2 30%  gi: Tube feeds - held Renal: Foley in place - 1.2L in proceeding 24 hours.   06/27 0701 - 06/28 0700 In: 2337.4 [I.V.:1758.4; NG/GT:579] Out: 5361 [Urine:1275; Emesis/NG output:2100] Lab Results  Component Value Date   CREATININE 6.98 (H) 02/12/2022   CREATININE 6.36 (H) 02/11/2022   CREATININE 8.75 (H) 02/10/2022     Objective:  Vital signs in last 24 hours:  Temp:  [95.7 F (35.4 C)-99 F (37.2 C)] 98.1 F (36.7 C) (06/28 1000) Pulse Rate:  [26-109] 109 (06/28 1000) Resp:  [16-26] 20 (06/28 1000) BP: (86-233)/(50-187) 127/84 (06/28 0945) SpO2:  [92 %-100 %] 97 % (06/28 1115) FiO2 (%):  [30 %] 30 % (06/28 1115) Weight:  [148.5 kg] 148.5 kg (06/28 0500)  Weight change: -0.5 kg Filed Weights   02/10/22 1745 02/11/22 0421 02/12/22 0500  Weight: (!) 147.6 kg (!) 147.8 kg (!) 148.5 kg    Intake/Output:    Intake/Output Summary (Last 24 hours) at 02/12/2022 1222 Last data filed at 02/12/2022 1000 Gross per 24 hour  Intake 1606.3 ml  Output 3175 ml  Net -1568.7 ml      Physical Exam: General: Critically ill-appearing, laying in the bed  HEENT ET tube, OG tube in place  Pulm/lungs Ventilator assisted  CVS/Heart Atrial fibrillation, irregular  Abdomen:  Soft  Extremities: Some dependent edema present  Neurologic: Sedation, responds to name  Skin: Warm, dry  Access: Right IJ temp catheter in place, Rt chest permcath       Basic Metabolic Panel:  Recent Labs  Lab 02/08/22 0336 02/09/22 0330 02/10/22 0329 02/11/22 0330 02/12/22 0503 02/12/22 0518  NA 133* 133* 135 135  --  134*  K 4.6 5.1 4.7 4.8  --  4.3  CL 97* 99 98 96*  --  95*  CO2 23 19* 19* 23  --  23  GLUCOSE 140* 143* 98 107*  --  91  BUN 78* 119* 136* 98*  --  118*   CREATININE 6.45* 7.81* 8.75* 6.36*  --  6.98*  CALCIUM 9.1 9.2 9.1 8.9  --  8.9  MG 2.4 2.7* 2.8* 2.8* 2.8*  --   PHOS 9.1* 11.4* >30.0* 9.3*  --  9.4*      CBC: Recent Labs  Lab 02/08/22 0336 02/09/22 0330 02/10/22 0329 02/11/22 0330 02/12/22 0503  WBC 10.1 8.2 7.9 7.0 5.6  NEUTROABS 6.7 5.6 4.4 5.0 3.5  HGB 9.5* 8.9* 8.0* 8.1* 7.8*  HCT 31.0* 29.1* 26.6* 26.9* 23.9*  MCV 66.5* 66.3* 67.0* 66.6* 66.0*  PLT 185 190 198 221 264       Lab Results  Component Value Date   HEPBSAG NON REACTIVE 02/04/2022   HEPBSAB NON REACTIVE 02/04/2022   HEPBIGM NON REACTIVE 02/02/2022      Microbiology:  Recent Results (from the past 240 hour(s))  Culture, blood (Routine X 2) w Reflex to ID Panel     Status: None   Collection Time: 02/06/22  8:34 AM   Specimen: BLOOD  Result Value Ref Range Status   Specimen Description BLOOD LEFT WRIST  Final   Special Requests   Final    BOTTLES DRAWN AEROBIC AND ANAEROBIC Blood Culture results may not be optimal due to an inadequate volume of blood received in culture bottles  Culture   Final    NO GROWTH 5 DAYS Performed at [facility], [address] [place], [place] [address]    Report Status 02/11/2022 FINAL  Final  Culture, blood (Routine X 2) w Reflex to ID Panel     Status: None   Collection Time: 02/06/22  3:39 PM   Specimen: BLOOD  Result Value Ref Range Status   Specimen Description BLOOD [facility]  Final   Special Requests BOTTLES DRAWN AEROBIC AND ANAEROBIC BCAV  Final   Culture   Final    NO GROWTH 5 DAYS Performed at [facility], [address], [place],  [address]    Report Status 02/11/2022 FINAL  Final    Coagulation Studies: No results for input(s): "LABPROT", "INR" in the last 72 hours.   Urinalysis: No results for input(s): "COLORURINE", "LABSPEC", "PHURINE", "GLUCOSEU", "HGBUR", "BILIRUBINUR", "KETONESUR", "PROTEINUR", "UROBILINOGEN", "NITRITE", "LEUKOCYTESUR" in the last 72  hours.  Invalid input(s): "APPERANCEUR"     Imaging: DG Chest Port 1 View  Result Date: 02/11/2022 CLINICAL DATA:  PICC line placement EXAM: PORTABLE CHEST 1 VIEW COMPARISON:  02/10/2022 FINDINGS: Transverse diameter of heart is increased. Central pulmonary vessels are slightly less prominent. There are no signs of alveolar pulmonary edema or new focal infiltrates. Costophrenic angles are clear. There is no pneumothorax. Tip of endotracheal tube is 4.5 cm above the carina. Enteric tube is noted traversing the esophagus. Tip of dialysis catheter is seen in the region of right atrium. There is interval placement of PICC line through the right upper extremity with its distal course in the superior vena cava. IMPRESSION: Tip of PICC line is seen in the course of superior vena cava. Cardiomegaly. There is partial clearing of pulmonary vascular congestion. Electronically Signed   By: Elmer Picker M.D.   On: 02/11/2022 15:10   DG Abd 1 View  Result Date: 02/11/2022 CLINICAL DATA:  Vomiting EXAM: ABDOMEN - 1 VIEW COMPARISON:  02/10/2022 FINDINGS: Tip of enteric tube is seen in the region of body of stomach. Bowel gas pattern is nonspecific. There is no significant small bowel dilation. There is interval decrease in amount of gas in the stomach. Transverse diameter of heart is increased. IMPRESSION: Tip of enteric tube is seen in the stomach. Nonspecific bowel gas pattern. Electronically Signed   By: Elmer Picker M.D.   On: 02/11/2022 15:07   [place] EKG SITE RITE  Result Date: 02/11/2022 If Site Rite image not attached, placement could not be confirmed due to current cardiac rhythm.  DG Abd 1 View  Result Date: 02/10/2022 CLINICAL DATA:  Orogastric tube placement EXAM: ABDOMEN - 1 VIEW COMPARISON:  None Available. FINDINGS: Limited radiograph of the lower chest and upper abdomen was obtained for the purposes of enteric tube localization. Enteric tube is seen coursing below the diaphragm with  distal tip and side port terminating within the expected location of the gastric body. IMPRESSION: Enteric tube with distal tip and side port terminating within the expected location of the gastric body. Electronically Signed   By: Davina Poke D.O.   On: 02/10/2022 19:11   DG Chest Port 1 View  Result Date: 02/10/2022 CLINICAL DATA:  ET tube, OG tube placement EXAM: PORTABLE CHEST 1 VIEW COMPARISON:  02/10/2022 FINDINGS: Endotracheal tube and NG tube remain in place, unchanged. Left central line tip at the confluence of the innominate veins. Right dialysis catheter tips in the right atrium. Cardiomegaly with vascular congestion. Bibasilar atelectasis. No overt edema. IMPRESSION: Cardiomegaly, vascular  congestion.  Bibasilar atelectasis. Electronically Signed   By: Rolm Baptise M.D.   On: 02/10/2022 19:10     Medications:    sodium chloride Stopped (02/02/22 2010)   feeding supplement (VITAL HIGH PROTEIN) Stopped (02/11/22 1239)   fentaNYL infusion INTRAVENOUS 150 mcg/hr (02/12/22 1000)   heparin 2,500 Units/hr (02/12/22 1000)   propofol (DIPRIVAN) infusion 40 mcg/kg/min (02/12/22 1008)    alteplase  2 mg Intracatheter Once   Chlorhexidine Gluconate Cloth  6 each Topical Q0600   docusate  100 mg Per Tube BID   feeding supplement (PROSource TF)  90 mL Per Tube BID   free water  100 mL Per Tube Q4H   insulin aspart  0-6 Units Subcutaneous Q4H   ipratropium-albuterol  3 mL Nebulization Q6H   metoprolol tartrate  25 mg Oral Q6H   multivitamin  1 tablet Per Tube QHS   mouth rinse  15 mL Mouth Rinse Q2H   pantoprazole sodium  40 mg Per Tube Daily   PHENObarbital  60 mg Per Tube TID   polyethylene glycol  17 g Per Tube Daily   sodium chloride flush  10-40 mL Intracatheter Q12H   thiamine injection  100 mg Intravenous Daily   sodium chloride, acetaminophen, bisacodyl, fentaNYL (SUBLIMAZE) injection, heparin, heparin, lidocaine (PF), lidocaine-prilocaine, LORazepam, midazolam, mouth rinse,  pentafluoroprop-tetrafluoroeth, sodium chloride flush, vecuronium  Assessment/ Plan:  41 y.o. male with   chronic systolic and diastolic CHF, hypertension, anemia, polysubstance abuse (cocaine and tobacco), chronic kidney disease, non-STEMI May 2023, A-fib with RVR admitted on 01/23/2022 for Acute respiratory failure ([name]) [J96.00] Acute respiratory failure with hypoxia ([name]) [J96.01] Atrial fibrillation with RVR ([place]) [I48.91] Community acquired pneumonia of right lung, unspecified part of lung [J18.9] Acute on chronic congestive heart failure, unspecified heart failure type ([place]) [I50.9] Acute respiratory failure with hypoxemia ([name]) [J96.01]  Acute kidney injury on chronic kidney disease stage IIIb. Baseline creatinine 2.03/GFR 42 on November 04, 2021. Renal ultrasound without obstruction.  No IV contrast exposure.AKI likely secondary to cardiorenal syndrome.  CRRT started on June 17 and stopped on June 20.  Scheduled to receive dialysis today, continues to have adequate urine output. BUN remains elevated, 118 with creatinine 6.98. Will continue to monitor and determine decreased or extended need for dialysis.   Acute respiratory failure Currently ventilator assisted.   CAP treated with antibiotics  Acute exacerbation of chronic systolic and diastolic CHF 2D echo September 05, 2021-severe global reduction in LV function, severe LVH, EF 20 to 25%, global hypokinesis, grade 3 diastolic dysfunction Adequate urine output noted    LOS: [place] 6/28/202312:22 PM  [place], [place]

## 2022-02-12 NOTE — Progress Notes (Signed)
Progress Note  Patient Name: Gerald [name] Date of Encounter: 02/12/2022  [facility] Cardiologist: Ida Rogue, MD   Subjective   Patient seen on AM rounds. Remains intubated and sedated. -1L in last 24 hours. Hgb 7.8 this morning. Did not tolerate tube feedings yesterday and OGT has to be placed to suction.  Inpatient Medications    Scheduled Meds:  alteplase  2 mg Intracatheter Once   Chlorhexidine Gluconate Cloth  6 each Topical Q0600   docusate  100 mg Per Tube BID   feeding supplement (PROSource TF)  90 mL Per Tube BID   free water  100 mL Per Tube Q4H   insulin aspart  0-6 Units Subcutaneous Q4H   ipratropium-albuterol  3 mL Nebulization Q6H   lidocaine  1 patch Transdermal Q24H   metoprolol tartrate  25 mg Oral Q6H   multivitamin  1 tablet Per Tube QHS   mouth rinse  15 mL Mouth Rinse Q2H   pantoprazole sodium  40 mg Per Tube Daily   PHENObarbital  60 mg Per Tube TID   polyethylene glycol  17 g Per Tube Daily   sodium chloride flush  10-40 mL Intracatheter Q12H   thiamine injection  100 mg Intravenous Daily   Continuous Infusions:  sodium chloride Stopped (02/02/22 2010)   feeding supplement (VITAL HIGH PROTEIN) Stopped (02/11/22 1239)   fentaNYL infusion INTRAVENOUS 150 mcg/hr (02/12/22 0400)   heparin 2,500 Units/hr (02/12/22 0610)   propofol (DIPRIVAN) infusion 40 mcg/kg/min (02/12/22 0509)   PRN Meds: sodium chloride, acetaminophen, fentaNYL (SUBLIMAZE) injection, heparin, heparin, lidocaine (PF), lidocaine-prilocaine, LORazepam, midazolam, mouth rinse, pentafluoroprop-tetrafluoroeth, sodium chloride flush, vecuronium   Vital Signs    Vitals:   02/12/22 0500 02/12/22 0515 02/12/22 0600 02/12/22 0738  BP: (!) 116/103 120/77 (!) 115/53   Pulse: 86 81 84   Resp: 20 20 20    Temp: (!) 97.2 F (36.2 C) (!) 97.2 F (36.2 C) (!) 97.3 F (36.3 C)   TempSrc:      SpO2: 97% 96% 96% 97%  Weight: (!) 148.5 kg     Height:        Intake/Output  Summary (Last 24 hours) at 02/12/2022 0750 Last data filed at 02/12/2022 0400 Gross per 24 hour  Intake 2337.44 ml  Output 3375 ml  Net -1037.56 ml      02/12/2022    5:00 AM 02/11/2022    4:21 AM 02/10/2022    5:45 PM  Last 3 Weights  Weight (lbs) 327 lb 6.1 oz 325 lb 13.4 oz 325 lb 6.4 oz  Weight (kg) 148.5 kg 147.8 kg 147.6 kg      Telemetry    Atrial fibrillation rate  - Personally Reviewed  ECG    No new tracings- Personally Reviewed  Physical Exam   GEN: No acute distress. Sedated.   Neck: Unable to assess JVD due to EET, OGT and lines Cardiac: Irregularly irregular and distant, no murmurs, rubs, or gallops.  Respiratory: Coarse anteriorly to auscultation bilaterally. GI: Soft, nontender, non-distended , obese MS: Trace pretibial edema; No deformity. Neuro:  Intubated and sedated Psych: Intubated and sedated  Labs    High Sensitivity Troponin:   Recent Labs  Lab 02/08/2022 0023 02/01/2022 0153 01/23/2022 0537 02/08/2022 0638  TROPONINIHS 553* 472* 500* 450*     Chemistry Recent Labs  Lab 02/06/22 0338 02/06/22 1539 02/10/22 0329 02/11/22 0330 02/12/22 0503 02/12/22 0518  NA 134*   < > 135 135  --  134*  K 4.1   < > 4.7 4.8  --  4.3  CL 101   < > 98 96*  --  95*  CO2 20*   < > 19* 23  --  23  GLUCOSE 133*   < > 98 107*  --  91  BUN 85*   < > 136* 98*  --  118*  CREATININE 7.08*   < > 8.75* 6.36*  --  6.98*  CALCIUM 8.9   < > 9.1 8.9  --  8.9  MG  --    < > 2.8* 2.8* 2.8*  --   PROT 7.3  --  6.9  --   --   --   ALBUMIN 2.7*   < > 2.4*  2.5* 2.8*  --  2.6*  AST 172*  --  39  --   --   --   ALT 652*  --  164*  --   --   --   ALKPHOS 137*  --  88  --   --   --   BILITOT 0.4  --  0.6  --   --   --   GFRNONAA 9*   < > 7* 11*  --  9*  ANIONGAP 13   < > 18* 16*  --  16*   < > = values in this interval not displayed.    Lipids  Recent Labs  Lab 02/10/22 0329  TRIG 181*    Hematology Recent Labs  Lab 02/10/22 0329 02/11/22 0330 02/12/22 0503  WBC  7.9 7.0 5.6  RBC 3.97* 4.04* 3.62*  HGB 8.0* 8.1* 7.8*  HCT 26.6* 26.9* 23.9*  MCV 67.0* 66.6* 66.0*  MCH 20.2* 20.0* 21.5*  MCHC 30.1 30.1 32.6  RDW 18.1* 17.9* 18.2*  PLT 198 221 264   Thyroid No results for input(s): "TSH", "FREET4" in the last 168 hours.  BNPNo results for input(s): "BNP", "PROBNP" in the last 168 hours.  DDimer No results for input(s): "DDIMER" in the last 168 hours.   Radiology    DG Chest Port 1 View  Result Date: 02/11/2022 CLINICAL DATA:  PICC line placement EXAM: PORTABLE CHEST 1 VIEW COMPARISON:  02/10/2022 FINDINGS: Transverse diameter of heart is increased. Central pulmonary vessels are slightly less prominent. There are no signs of alveolar pulmonary edema or new focal infiltrates. Costophrenic angles are clear. There is no pneumothorax. Tip of endotracheal tube is 4.5 cm above the carina. Enteric tube is noted traversing the esophagus. Tip of dialysis catheter is seen in the region of right atrium. There is interval placement of PICC line through the right upper extremity with its distal course in the superior vena cava. IMPRESSION: Tip of PICC line is seen in the course of superior vena cava. Cardiomegaly. There is partial clearing of pulmonary vascular congestion. Electronically Signed   By: Elmer Picker M.D.   On: 02/11/2022 15:10   DG Abd 1 View  Result Date: 02/11/2022 CLINICAL DATA:  Vomiting EXAM: ABDOMEN - 1 VIEW COMPARISON:  02/10/2022 FINDINGS: Tip of enteric tube is seen in the region of body of stomach. Bowel gas pattern is nonspecific. There is no significant small bowel dilation. There is interval decrease in amount of gas in the stomach. Transverse diameter of heart is increased. IMPRESSION: Tip of enteric tube is seen in the stomach. Nonspecific bowel gas pattern. Electronically Signed   By: Elmer Picker M.D.   On: 02/11/2022 15:07   [place] EKG SITE RITE  Result  Date: 02/11/2022 If Site Rite image not attached, placement could not be  confirmed due to current cardiac rhythm.  DG Abd 1 View  Result Date: 02/10/2022 CLINICAL DATA:  Orogastric tube placement EXAM: ABDOMEN - 1 VIEW COMPARISON:  None Available. FINDINGS: Limited radiograph of the lower chest and upper abdomen was obtained for the purposes of enteric tube localization. Enteric tube is seen coursing below the diaphragm with distal tip and side port terminating within the expected location of the gastric body. IMPRESSION: Enteric tube with distal tip and side port terminating within the expected location of the gastric body. Electronically Signed   By: Davina Poke D.O.   On: 02/10/2022 19:11   DG Chest Port 1 View  Result Date: 02/10/2022 CLINICAL DATA:  ET tube, OG tube placement EXAM: PORTABLE CHEST 1 VIEW COMPARISON:  02/10/2022 FINDINGS: Endotracheal tube and NG tube remain in place, unchanged. Left central line tip at the confluence of the innominate veins. Right dialysis catheter tips in the right atrium. Cardiomegaly with vascular congestion. Bibasilar atelectasis. No overt edema. IMPRESSION: Cardiomegaly, vascular congestion.  Bibasilar atelectasis. Electronically Signed   By: Rolm Baptise M.D.   On: 02/10/2022 19:10   DG Chest 1 View  Result Date: 02/10/2022 CLINICAL DATA:  Shortness this of breath EXAM: CHEST  1 VIEW COMPARISON:  Portable exam 1059 hours compared to 02/06/2022 FINDINGS: Tip of endotracheal tube projects 3.4 cm above carina. LEFT jugular central venous catheter tip projects over LEFT brachiocephalic vein. RIGHT jugular central venous catheter tip projects over RIGHT atrium. Enlargement of cardiac silhouette with pulmonary vascular congestion. BILATERAL mild perihilar infiltrates likely pulmonary edema. Minimal RIGHT basilar atelectasis. No pneumothorax. IMPRESSION: Enlargement of cardiac silhouette with pulmonary vascular congestion and probable mild pulmonary edema. RIGHT basilar atelectasis. Electronically Signed   By: Lavonia Dana M.D.   On:  02/10/2022 11:25    Cardiac Studies  Echocardiogram completed 09/05/2021  1. Severe global reduction in LV function; severe LVH; suggest cardiac MRI to R/O infiltrative cardiomyopathy or HCM.   2. Left ventricular ejection fraction, by estimation, is 20 to 25%. The left ventricle has severely decreased function. The left ventricle demonstrates global hypokinesis. The left ventricular internal cavity size was moderately dilated. There is severe  left ventricular hypertrophy. Left ventricular diastolic parameters are consistent with Grade III diastolic dysfunction (restrictive).   3. Right ventricular systolic function is normal. The right ventricular size is normal. There is mildly elevated pulmonary artery systolic pressure.   4. Left atrial size was severely dilated.   5. A small pericardial effusion is present.   6. The mitral valve is normal in structure. Trivial mitral valve regurgitation. No evidence of mitral stenosis.   7. The aortic valve is tricuspid. Aortic valve regurgitation is not visualized. No aortic stenosis is present.   8. Aortic dilatation noted. There is mild dilatation of the aortic root, measuring 40 mm. There is mild dilatation of the ascending aorta, measuring 40 mm.   9. The inferior vena cava is normal in size with greater than 50% respiratory variability, suggesting right atrial pressure of 3 mmHg.   Patient Profile     41 y.o. male with a history of nonischemic cardiomyopathy, polysubstance abuse, obesity, who presented for worsening shortness of breath and respiratory failure leading to a PEA arrest s/p CPR and given epi with ROSC, who is being seen and evaluated for nonischemic cardiomyopathy and atrial fibrillation RVR.  Assessment & Plan    Atrial fibrillation -ventricular rates remain 90-100 -continue  metoprolol 25 mg every 6 hours -continue cardiac monitor -continue heparin infusion until able to take oral anticoagulants  -hgb 7.8 this morning, slowly  dropping, may have to consider holding heparin drip -no active bleeding noted  Acute on chronic HFrEF likely due to nonischemic cardiomyopathy -limited echocardiogram ordered and pending to reassess LVEF -metoprolol tartrate increased yesterday, continue current dosing -transition to Toprol XL when taking po medications -unable to challenge with increasing GDMT due to soft blood pressures -fluid removal per nephrology with HD  Acute respiratory failure with hypoxia -likely multifactorial including HFrEF and possible PNA -continue with ventilator management per CCM -patient may need trach to wean from vent per CCM  ESRD -per nephrology - -1L out in the last 24 hours -daily BMP -avoid nephrotoxic agents      For questions or updates, please contact [place] Please consult [email or web address] for contact info under        Signed, Kennadie Brenner, NP  02/12/2022, 7:50 AM

## 2022-02-12 NOTE — Progress Notes (Addendum)
Post failed SBT pt HR 170's, Spo2 50's-60's, RN, RT, and NP Nelson at bedside. RN and NP bagged pt, NP lavaged to evacuate a potential plug. RN gave PRN Versed and Vec per order. Pt HR 120-130's, Spo2 90-92% on 100% FiO2 and 15 peep, soft BP SBP 90's. Orders received for ABG, CXR, levo gtt, and lasix. NP Laurann Montana and MD Kasa discussing event with pt mother at bedside.

## 2022-02-12 NOTE — Progress Notes (Signed)
Pt failed PSV 5/5 wean due to increase in HR, Accessory Muscle movement, and Desaturation

## 2022-02-12 NOTE — IPAL (Signed)
  Interdisciplinary Goals of Care Family Meeting   Date carried out: 02/12/2022  Location of the meeting: Phone conference  Member's involved: Physician, Nurse Practitioner, Family Member or next of kin, and Palliative care team member   ATTEMPTED WEANING TRIAL WHILE MOTHER AT BEDSIDE WHO WITNESSED [place] PEEP INCREASED TO 15, FIO2 INCREASED to 100% PATIENT WAS BAGGED WITH BVM FOR 20MINS   GOALS OF CARE DISCUSSION  The Clinical status was relayed to family in [place] PHONE -[place], [place] AND [place] , WHO HAVE RELINQUISHED DECISION RIGHTS TO MOTHER OF PATIENT   Updated and notified of patients medical condition- Patient remains unresponsive and will not open eyes to command.   Patient is having a weak cough and struggling to remove secretions.   Patient with increased WOB and using accessory muscles to breathe Explained to family course of therapy and the modalities   Patient with Progressive multiorgan failure with a very high probablity of a very minimal chance of meaningful recovery despite all aggressive and optimal medical therapy.    Family understands the situation.  They have consented and agreed to DNR and DO NOT [place]  Family are satisfied with Plan of action and management. All questions answered  Additional CC time 35 mins   Annelies Coyt Patricia Pesa, M.D.  [facility]  Medical Director [place] Director [facility] Cardio-Pulmonary Department

## 2022-02-12 NOTE — Progress Notes (Signed)
Pt desat into 70's, pt bagged with 15cm of peep 100% oxygen, bloody plug removed from lungs. Pt placed back on vent with resting settings - pt desat multiple times and bagged back to 90's. PEEP increased to 15 with 100% sat maintained in low 90's. Will continue to monitor closely

## 2022-02-12 NOTE — Progress Notes (Signed)
NAME:  Gerald [name], MRN:  [id number], DOB:  [birth year], LOS: 45 ADMISSION DATE:  02/01/2022 CONSULTATION DATE:  02/12/2022  Brief patient description Gerald [name]:  41 yo morbidly obese AAM with acute sCHF exacerbation with acute hypoxic resp failure with pulm edema and renal failure leading to severe resp failure due to severe end stage nonischemic sCHF exacerbation leading to progressive cardiorenal syndrome with renal failure requiring CRRT  History of Present Illness:  41 y.o. male with history of chronic systolic and diastolic CHF last EF measured was 20 to 25% in January 2023 with history of hypertension, chronic kidney disease stage III baseline creatinine around 2,  anemia polysubstance abuse admits to taking cocaine presents to the ER because of worsening shortness of breath for the last 3 days with no associated chest pain has some productive cough.   Patient states he has not taken his medicines for last 3 days.  Admitted to [facility] with SOB   ED Course: In the ER patient was hypoxic initially requiring BiPAP but after placing on BiPAP patient had vomiting and had to be taken off the BiPAP was placed on high flow oxygen.  Patient chest x-ray shows congestion was placed on IV Lasix and also empirically on antibiotics.   BNP 790 high sensitive troponin was 553 and 4 and 72 was started on IV heparin.  Patient admitted for acute hypoxic respiratory failure likely from CHF.  Since patient was in A-fib with RVR initially was started on Cardizem infusion.  Transition to amiodarone.  PCCM consulted to assess worsening SOB and increased WOB  6/15  Significant Hospital Events: Including procedures, antibiotic start and stop dates in addition to other pertinent events   6/14 admitted for acute sCHF 6/15 PCCM consulted for impending resp failure 2023-02-19 patient coded for 1 round with the intubation tried from respiratory arrest 06/17 patient is anuric dialysis catheter is and nephrology is involved  he might need CRRT or HD 06/18 increase in liver enzymes to above 1000 we will hold amiodarone and atorvastatin 6/19 multiorgan failure, on CRRT  6/20: Failed SBT due to delirium and increased work of breathing, tachycardia, diaphoresis.  Transitioned from CRRT to intermittent HD (plan for first HD session 6/21) 6/21: Plan for HD this morning, then will attempt SBT.  Precedex as needed for agitation/delirium with wake up assessment 6/22: Pt remains mechanically intubated requiring minimal ventilator settings.  Sedated with precedex and fentanyl gtts agitation/delirium improving. Contacted nephrology requesting HD session today prior to SBT trial.  Low-grade fevers overnight blood and respiratory cultures pending  6/23: Fevers have slightly improved.  Pt had a period of agitation requiring 1x dose of 2 mg iv versed.  Will repeat WUA in an attempt to perform SBT  6/24: Persistent issues with agitation, increased issues with arrhythmia, beta-blockers increased.  Phenobarbital protocol for ICU delirium.  Thiamine 6/25: persistent delirium, switched to Propofol, will need tracheostomy to wean 6/26 extubated due mucus plugs in ETT, re-intubated without difficulty +UPPER AIRWAY SWELLING 6/27 remains intubated on vent  Micro Data:  6/15: SARS-CoV-2 and influenza PCR>> negative 6/15: Blood culture x2>> no growth 6/15: MRSA PCR>> negative 6/15: Sputum>> normal respiratory flora 6/15: Strep pneumo and Legionella urinary antigens>> negative 02/19/23: Mycoplasma pneumonia a IgM>> less than 770 6/18: Hepatitis B>> nonreactive 6/22: Blood x2>>negative  6/22: Respiratory>>  Antimicrobials:   Antibiotics Given (last 72 hours)     Date/Time Action Medication Dose Rate   02/09/22 1956 New Bag/Given   ceFEPIme (MAXIPIME) 1 g  in sodium chloride 0.9 % 100 mL IVPB 1 g 200 mL/hr   02/10/22 1952 New Bag/Given   ceFEPIme (MAXIPIME) 1 g in sodium chloride 0.9 % 100 mL IVPB 1 g 200 mL/hr       Interim History /  Subjective:  Remains intubated On vent Remains critically ill End stage cardiomyopathy May have progressed to end stage renal disease   Objective   Blood pressure (!) 115/53, pulse 84, temperature (!) 97.3 F (36.3 C), resp. rate 20, height 6' (1.829 m), weight (!) 148.5 kg, SpO2 96 %.    Vent Mode: PRVC FiO2 (%):  [30 %] 30 % Set Rate:  [20 bmp] 20 bmp Vt Set:  [550 mL] 550 mL PEEP:  [5 cmH20] 5 cmH20 Plateau Pressure:  [16 cmH20-23 cmH20] 23 cmH20   Intake/Output Summary (Last 24 hours) at 02/12/2022 0731 Last data filed at 02/12/2022 0400 Gross per 24 hour  Intake 2337.44 ml  Output 3375 ml  Net -1037.56 ml    Filed Weights   02/10/22 1745 02/11/22 0421 02/12/22 0500  Weight: (!) 147.6 kg (!) 147.8 kg (!) 148.5 kg    REVIEW OF SYSTEMS  PATIENT IS UNABLE TO PROVIDE COMPLETE REVIEW OF SYSTEMS DUE TO SEVERE CRITICAL ILLNESS    PHYSICAL EXAMINATION:  GENERAL:critically ill appearing, +resp distress EYES: Pupils equal, round, reactive to light.  No scleral icterus.  MOUTH: Moist mucosal membrane. INTUBATED NECK: Supple.  PULMONARY: +rhonchi, +wheezing CARDIOVASCULAR: S1 and S2.  No murmurs  GASTROINTESTINAL: Soft, nontender, -distended. Positive bowel sounds.  MUSCULOSKELETAL: No swelling, clubbing, or edema.  NEUROLOGIC: obtunded SKIN:intact,warm,dry   Labs/imaging that I havepersonally reviewed  (right click and "Reselect all SmartList Selections" daily)  All labs reviewed 02/08/22  Labs   CBC: Recent Labs  Lab 02/08/22 0336 02/09/22 0330 02/10/22 0329 02/11/22 0330 02/12/22 0503  WBC 10.1 8.2 7.9 7.0 5.6  NEUTROABS 6.7 5.6 4.4 5.0 3.5  HGB 9.5* 8.9* 8.0* 8.1* 7.8*  HCT 31.0* 29.1* 26.6* 26.9* 23.9*  MCV 66.5* 66.3* 67.0* 66.6* 66.0*  PLT 185 190 198 221 264     Basic Metabolic Panel: Recent Labs  Lab 02/08/22 0336 02/09/22 0330 02/10/22 0329 02/11/22 0330 02/12/22 0503 02/12/22 0518  NA 133* 133* 135 135  --  134*  K 4.6 5.1 4.7 4.8   --  4.3  CL 97* 99 98 96*  --  95*  CO2 23 19* 19* 23  --  23  GLUCOSE 140* 143* 98 107*  --  91  BUN 78* 119* 136* 98*  --  118*  CREATININE 6.45* 7.81* 8.75* 6.36*  --  6.98*  CALCIUM 9.1 9.2 9.1 8.9  --  8.9  MG 2.4 2.7* 2.8* 2.8* 2.8*  --   PHOS 9.1* 11.4* >30.0* 9.3*  --  9.4*    GFR: Estimated Creatinine Clearance: 20.9 mL/min (A) (by C-G formula based on SCr of 6.98 mg/dL (H)). Recent Labs  Lab 02/06/22 0338 02/06/22 1539 01/16/2022 0333 02/09/22 0330 02/10/22 0329 02/11/22 0330 02/12/22 0503  PROCALCITON 7.74 5.92  --   --  1.71 1.20  --   WBC 9.2  --    < > 8.2 7.9 7.0 5.6   < > = values in this interval not displayed.     Liver Function Tests: Recent Labs  Lab 02/06/22 0338 02/06/22 1539 02/08/22 0336 02/09/22 0330 02/10/22 0329 02/11/22 0330 02/12/22 0518  AST 172*  --   --   --  39  --   --  ALT 652*  --   --   --  164*  --   --   ALKPHOS 137*  --   --   --  88  --   --   BILITOT 0.4  --   --   --  0.6  --   --   PROT 7.3  --   --   --  6.9  --   --   ALBUMIN 2.7*   < > 2.5* 2.5* 2.4*  2.5* 2.8* 2.6*   < > = values in this interval not displayed.    No results for input(s): "LIPASE", "AMYLASE" in the last 168 hours. No results for input(s): "AMMONIA" in the last 168 hours.   ABG    Component Value Date/Time   PHART 7.27 (L) 02/02/2022 0500   PCO2ART 44 02/02/2022 0500   PO2ART 125 (H) 02/02/2022 0500   HCO3 20.2 02/02/2022 0500   ACIDBASEDEF 6.6 (H) 02/02/2022 0500   O2SAT 99.5 02/02/2022 0500   HbA1C: Hgb A1c MFr Bld  Date/Time Value Ref Range Status  09/05/2021 05:00 PM 6.1 (H) 4.8 - 5.6 % Final    Comment:    (NOTE)         Prediabetes: 5.7 - 6.4         Diabetes: >6.4         Glycemic control for adults with diabetes: <7.0   03/30/2021 08:25 PM 6.2 (H) 4.8 - 5.6 % Final    Comment:    (NOTE) Pre diabetes:          5.7%-6.4%  Diabetes:              >6.4%  Glycemic control for   <7.0% adults with diabetes     CBG: Recent  Labs  Lab 02/11/22 1130 02/11/22 1559 02/11/22 1923 02/11/22 2316 02/12/22 0347  GLUCAP 115* 106* 107* 96 101*    Scheduled Meds:  alteplase  2 mg Intracatheter Once   Chlorhexidine Gluconate Cloth  6 each Topical Q0600   docusate  100 mg Per Tube BID   feeding supplement (PROSource TF)  90 mL Per Tube BID   free water  100 mL Per Tube Q4H   insulin aspart  0-6 Units Subcutaneous Q4H   ipratropium-albuterol  3 mL Nebulization Q6H   lidocaine  1 patch Transdermal Q24H   metoprolol tartrate  25 mg Oral Q6H   multivitamin  1 tablet Per Tube QHS   mouth rinse  15 mL Mouth Rinse Q2H   pantoprazole sodium  40 mg Per Tube Daily   PHENObarbital  60 mg Per Tube TID   polyethylene glycol  17 g Per Tube Daily   sodium chloride flush  10-40 mL Intracatheter Q12H   thiamine injection  100 mg Intravenous Daily   Continuous Infusions:  sodium chloride Stopped (02/02/22 2010)   feeding supplement (VITAL HIGH PROTEIN) Stopped (02/11/22 1239)   fentaNYL infusion INTRAVENOUS 150 mcg/hr (02/12/22 0400)   heparin 2,500 Units/hr (02/12/22 0610)   propofol (DIPRIVAN) infusion 40 mcg/kg/min (02/12/22 0509)   PRN Meds:.sodium chloride, acetaminophen, fentaNYL (SUBLIMAZE) injection, heparin, heparin, lidocaine (PF), lidocaine-prilocaine, LORazepam, midazolam, mouth rinse, pentafluoroprop-tetrafluoroeth, sodium chloride flush, vecuronium      ASSESSMENT AND PLAN 41 yo morbidly obese AAM with end stage nonischemic cardiomyopathy with acute systolic heart failure leading to progressive cardiorenal syndrome failure to wean from vent needing TRACH To survive  Severe ACUTE Hypoxic and Hypercapnic Respiratory Failure -continue Mechanical Ventilator support -Wean Fio2 and  PEEP as tolerated -VAP/VENT bundle implementation - Wean PEEP & FiO2 as tolerated, maintain SpO2 > 88% - Head of bed elevated 30 degrees, VAP protocol in place - Plateau pressures less than 30 cm H20  - Intermittent chest x-ray &  ABG PRN - Ensure adequate pulmonary hygiene  RECOMMEND TRACH    ACUTE Hypoxic  Respiratory Failure in setting of  Acute Decompensated Combined Systolic & Diastolic CHF (EF 93%) secondary to NICM Afib with RVR VENT SUPPORT HD AS NEEDED CONTINUE AC FOLLOW UP CARDIOLOGY Rate control per cardiology  ACUTE KIDNEY INJURY/Renal Failure -continue Foley Catheter-assess need -Avoid nephrotoxic agents -Follow urine output, BMP -Ensure adequate renal perfusion, optimize oxygenation -Renal dose medications   Intake/Output Summary (Last 24 hours) at 02/12/2022 0735 Last data filed at 02/12/2022 0400 Gross per 24 hour  Intake 2337.44 ml  Output 3375 ml  Net -1037.56 ml    ENDO - ICU hypoglycemic\Hyperglycemia protocol -check FSBS per protocol   GI GI PROPHYLAXIS as indicated  NUTRITIONAL STATUS DIET-->TF's as tolerated Constipation protocol as indicated   ELECTROLYTES -follow labs as needed -replace as needed -pharmacy consultation and following   NEUROLOGY ACUTE  METABOLIC ENCEPHALOPATHY -need for sedation -Goal RASS -2 to -3 PMHx: Polysubstance abuse UDS negative  -Avoid sedating medications as able -Discontinue trazodone, Klonopin, hydrocodone -not effective -On phenobarb twice daily -Thiamine supplementation -Daily wake up assessment    Best practice (right click and "Reselect all SmartList Selections" daily)  Diet: Tube feeds DVT prophylaxis: Systemic AC Foley: Yes and still needed  Mobility:  bed rest  Code Status:  FULL Disposition: ICU     DVT/GI PRX  assessed I Assessed the need for Labs I Assessed the need for Foley I Assessed the need for Central Venous Line Family Discussion when available I Assessed the need for Mobilization I made an Assessment of medications to be adjusted accordingly Safety Risk assessment completed  CASE DISCUSSED IN MULTIDISCIPLINARY ROUNDS WITH ICU TEAM     Critical Care Time devoted to patient care services  described in this note is 45 minutes.  Critical care was necessary to treat /prevent imminent and life-threatening deterioration. Overall, patient is critically ill, prognosis is guarded.  Patient with Multiorgan failure and at high risk for cardiac arrest and death.    Gerald [name], M.D.  [facility]  Medical Director [place] Director [facility] Cardio-Pulmonary Department

## 2022-02-12 NOTE — Progress Notes (Signed)
*  PRELIMINARY RESULTS* Echocardiogram 2D Echocardiogram has been performed.  Gerald [name] 02/12/2022, 11:04 AM

## 2022-02-12 NOTE — Progress Notes (Signed)
ANTICOAGULATION CONSULT NOTE   Pharmacy Consult for Heparin Infusion Indication: ACS/STEMI/atrial fibrillation  Patient Measurements: Height: 6' (182.9 cm) Weight: (!) 148.5 kg (327 lb 6.1 oz) IBW/kg (Calculated) : 77.6 Heparin Dosing Weight: 109.9 kg  Labs: Recent Labs    02/10/22 0329 02/10/22 0957 02/11/22 0330 02/11/22 0615 02/12/22 0503 02/12/22 0518  HGB 8.0*  --  8.1*  --  7.8*  --   HCT 26.6*  --  26.9*  --  23.9*  --   PLT 198  --  221  --  264  --   HEPARINUNFRC 0.34 0.35  --  0.32 0.22*  --   CREATININE 8.75*  --  6.36*  --   --  6.98*     Estimated Creatinine Clearance: 20.9 mL/min (A) (by C-G formula based on SCr of 6.98 mg/dL (H)).   Medical History: Past Medical History:  Diagnosis Date   Acute on chronic combined systolic and diastolic CHF (congestive heart failure) ([name])    Acute on chronic systolic CHF (congestive heart failure) ([place]) 26/94/8546   Acute systolic congestive heart failure ([name])    Atrial flutter, paroxysmal ([name])    CHF (congestive heart failure) ([place]) 03/30/2021   Chronic combined systolic and diastolic CHF (congestive heart failure) ([name])    Chronic kidney disease, stage III (moderate) ([name])    Dysrhythmia    brief episode afib, cardioverted did not return   Hypertension    Hypertensive crisis 01/20/2021   Hypertensive emergency 12/21/2020   NSTEMI (non-ST elevated myocardial infarction) ([place]) 12/21/2015   Polysubstance abuse ([place])     Medications:  PTA meds include Xarelto 20 mg daily.  Last dose unknown.    Assessment: 41 y/o male with h/o Afib, CKD, HTN, substance abuse, CAD, NSTEMI and CHF who is admitted with PNA, sepsis, CHF and PEA arrest. Patient with bleeding from femoral site following removal of trialysis catheter ~heparin drip held and PAD device in place ~bleeding currently appears to have subsided and heparin has now been restarted  Goal of Therapy:  Heparin level 0.3-0.7 units/ml Monitor platelets by anticoagulation  protocol: Yes  Plan:  Heparin level therapeutic: continue heparin infusion at 2500 units/hr Recheck heparin level in 8 hours to confirm Daily CBC while on IV heparin  Vallery Sa, PharmD, BCPS 02/12/2022 7:11 AM

## 2022-02-13 ENCOUNTER — Inpatient Hospital Stay: Payer: Medicaid Other

## 2022-02-13 DIAGNOSIS — I5033 Acute on chronic diastolic (congestive) heart failure: Secondary | ICD-10-CM

## 2022-02-13 LAB — CBC WITH DIFFERENTIAL/PLATELET
Abs Immature Granulocytes: 0.05 10*3/uL (ref 0.00–0.07)
Abs Immature Granulocytes: 0.04 10*3/uL (ref 0.00–0.07)
Basophils Absolute: 0.1 10*3/uL (ref 0.0–0.1)
Basophils Absolute: 0.1 10*3/uL (ref 0.0–0.1)
Basophils Relative: 1 %
Basophils Relative: 1 %
Eosinophils Absolute: 0.3 10*3/uL (ref 0.0–0.5)
Eosinophils Absolute: 0.4 10*3/uL (ref 0.0–0.5)
Eosinophils Relative: 4 %
Eosinophils Relative: 6 %
HCT: 23.6 % — ABNORMAL LOW (ref 39.0–52.0)
HCT: 24.6 % — ABNORMAL LOW (ref 39.0–52.0)
Hemoglobin: 8.1 g/dL — ABNORMAL LOW (ref 13.0–17.0)
Hemoglobin: 7.6 g/dL — ABNORMAL LOW (ref 13.0–17.0)
Immature Granulocytes: 1 %
Immature Granulocytes: 1 %
Lymphocytes Relative: 15 %
Lymphocytes Relative: 15 %
Lymphs Abs: 1 10*3/uL (ref 0.7–4.0)
Lymphs Abs: 1 10*3/uL (ref 0.7–4.0)
MCH: 22.9 pg — ABNORMAL LOW (ref 26.0–34.0)
MCH: 20.5 pg — ABNORMAL LOW (ref 26.0–34.0)
MCHC: 34.3 g/dL (ref 30.0–36.0)
MCHC: 30.9 g/dL (ref 30.0–36.0)
MCV: 66.7 fL — ABNORMAL LOW (ref 80.0–100.0)
MCV: 66.3 fL — ABNORMAL LOW (ref 80.0–100.0)
Monocytes Absolute: 0.7 10*3/uL (ref 0.1–1.0)
Monocytes Absolute: 0.9 10*3/uL (ref 0.1–1.0)
Monocytes Relative: 10 %
Monocytes Relative: 13 %
Neutro Abs: 4.9 10*3/uL (ref 1.7–7.7)
Neutro Abs: 4.3 10*3/uL (ref 1.7–7.7)
Neutrophils Relative %: 69 %
Neutrophils Relative %: 64 %
Platelets: 317 10*3/uL (ref 150–400)
Platelets: 322 10*3/uL (ref 150–400)
RBC: 3.54 MIL/uL — ABNORMAL LOW (ref 4.22–5.81)
RBC: 3.71 MIL/uL — ABNORMAL LOW (ref 4.22–5.81)
RDW: 18.3 % — ABNORMAL HIGH (ref 11.5–15.5)
RDW: 17.7 % — ABNORMAL HIGH (ref 11.5–15.5)
WBC: 7 10*3/uL (ref 4.0–10.5)
WBC: 6.6 10*3/uL (ref 4.0–10.5)
nRBC: 0 % (ref 0.0–0.2)
nRBC: 0 % (ref 0.0–0.2)

## 2022-02-13 LAB — GLUCOSE, CAPILLARY
Glucose-Capillary: 103 mg/dL — ABNORMAL HIGH (ref 70–99)
Glucose-Capillary: 103 mg/dL — ABNORMAL HIGH (ref 70–99)
Glucose-Capillary: 104 mg/dL — ABNORMAL HIGH (ref 70–99)
Glucose-Capillary: 93 mg/dL (ref 70–99)
Glucose-Capillary: 118 mg/dL — ABNORMAL HIGH (ref 70–99)

## 2022-02-13 LAB — HEPARIN LEVEL (UNFRACTIONATED)
Heparin Unfractionated: 0.3 IU/mL (ref 0.30–0.70)
Heparin Unfractionated: 0.47 IU/mL (ref 0.30–0.70)

## 2022-02-13 LAB — RENAL FUNCTION PANEL
Albumin: 2.6 g/dL — ABNORMAL LOW (ref 3.5–5.0)
Anion gap: 14 (ref 5–15)
BUN: 88 mg/dL — ABNORMAL HIGH (ref 6–20)
CO2: 25 mmol/L (ref 22–32)
Calcium: 8.6 mg/dL — ABNORMAL LOW (ref 8.9–10.3)
Chloride: 92 mmol/L — ABNORMAL LOW (ref 98–111)
Creatinine, Ser: 6.46 mg/dL — ABNORMAL HIGH (ref 0.61–1.24)
GFR, Estimated: 10 mL/min — ABNORMAL LOW (ref >60)
Glucose, Bld: 89 mg/dL (ref 70–99)
Phosphorus: 9.8 mg/dL — ABNORMAL HIGH (ref 2.5–4.6)
Potassium: 4.5 mmol/L (ref 3.5–5.1)
Sodium: 131 mmol/L — ABNORMAL LOW (ref 135–145)

## 2022-02-13 LAB — ABO/RH: ABO/RH(D): O POS

## 2022-02-13 LAB — COMPREHENSIVE METABOLIC PANEL
ALT: 72 U/L — ABNORMAL HIGH (ref 0–44)
AST: 20 U/L (ref 15–41)
Albumin: 2.7 g/dL — ABNORMAL LOW (ref 3.5–5.0)
Alkaline Phosphatase: 81 U/L (ref 38–126)
Anion gap: 17 — ABNORMAL HIGH (ref 5–15)
BUN: 94 mg/dL — ABNORMAL HIGH (ref 6–20)
CO2: 25 mmol/L (ref 22–32)
Calcium: 9.5 mg/dL (ref 8.9–10.3)
Chloride: 92 mmol/L — ABNORMAL LOW (ref 98–111)
Creatinine, Ser: 7.64 mg/dL — ABNORMAL HIGH (ref 0.61–1.24)
GFR, Estimated: 8 mL/min — ABNORMAL LOW (ref >60)
Glucose, Bld: 103 mg/dL — ABNORMAL HIGH (ref 70–99)
Potassium: 5 mmol/L (ref 3.5–5.1)
Sodium: 134 mmol/L — ABNORMAL LOW (ref 135–145)
Total Bilirubin: 0.7 mg/dL (ref 0.3–1.2)
Total Protein: 7.4 g/dL (ref 6.5–8.1)

## 2022-02-13 LAB — URINALYSIS, COMPLETE (UACMP) WITH MICROSCOPIC
Bilirubin Urine: NEGATIVE
Glucose, UA: NEGATIVE mg/dL
Hgb urine dipstick: NEGATIVE
Ketones, ur: NEGATIVE mg/dL
Leukocytes,Ua: NEGATIVE
Nitrite: NEGATIVE
Protein, ur: 30 mg/dL — AB
Specific Gravity, Urine: 1.014 (ref 1.005–1.030)
pH: 5 (ref 5.0–8.0)

## 2022-02-13 LAB — BILIRUBIN, DIRECT: Bilirubin, Direct: 0.1 mg/dL (ref 0.0–0.2)

## 2022-02-13 LAB — TRIGLYCERIDES
Triglycerides: 2561 mg/dL — ABNORMAL HIGH (ref <150)
Triglycerides: 159 mg/dL — ABNORMAL HIGH (ref <150)

## 2022-02-13 LAB — MAGNESIUM: Magnesium: 2.7 mg/dL — ABNORMAL HIGH (ref 1.7–2.4)

## 2022-02-13 LAB — PROTIME-INR
INR: 1.2 (ref 0.8–1.2)
Prothrombin Time: 14.7 seconds (ref 11.4–15.2)

## 2022-02-13 LAB — APTT: aPTT: 37 seconds — ABNORMAL HIGH (ref 24–36)

## 2022-02-13 LAB — SARS CORONAVIRUS 2 BY RT PCR: SARS Coronavirus 2 by RT PCR: NEGATIVE

## 2022-02-13 MED ORDER — METOPROLOL TARTRATE 25 MG PO TABS
25.0000 mg | ORAL_TABLET | Freq: Four times a day (QID) | ORAL | Status: DC
  Administered 2022-02-14 (×3): 25 mg via ORAL
  Filled 2022-02-13 (×3): qty 1

## 2022-02-13 MED ORDER — ACETAMINOPHEN 325 MG PO TABS: 650.0000 mg | ORAL_TABLET | Freq: Four times a day (QID) | ORAL | Status: DC | PRN

## 2022-02-13 MED ORDER — GLYCOPYRROLATE 1 MG PO TABS: 1.0000 mg | ORAL_TABLET | ORAL | Status: DC | PRN

## 2022-02-13 MED ORDER — METOPROLOL TARTRATE 5 MG/5ML IV SOLN
2.5000 mg | Freq: Once | INTRAVENOUS | Status: AC
  Administered 2022-02-13: 2.5 mg via INTRAVENOUS
  Filled 2022-02-13: qty 5

## 2022-02-13 MED ORDER — MORPHINE 100MG IN NS 100ML (1MG/ML) PREMIX INFUSION
0.0000 mg/h | INTRAVENOUS | Status: DC
  Administered 2022-02-14: 5 mg/h via INTRAVENOUS
  Filled 2022-02-13: qty 100

## 2022-02-13 MED ORDER — METOPROLOL TARTRATE 25 MG PO TABS
12.5000 mg | ORAL_TABLET | Freq: Four times a day (QID) | ORAL | Status: DC
  Administered 2022-02-13: 12.5 mg via ORAL
  Filled 2022-02-13: qty 1

## 2022-02-13 MED ORDER — ACETAMINOPHEN 650 MG RE SUPP: 650.0000 mg | Freq: Four times a day (QID) | RECTAL | Status: DC | PRN

## 2022-02-13 MED ORDER — MIDAZOLAM HCL 2 MG/2ML IJ SOLN
4.0000 mg | INTRAMUSCULAR | Status: DC | PRN
  Administered 2022-02-14: 4 mg via INTRAVENOUS
  Administered 2022-02-14: 6 mg via INTRAVENOUS
  Filled 2022-02-13: qty 6
  Filled 2022-02-13: qty 4

## 2022-02-13 MED ORDER — POLYVINYL ALCOHOL 1.4 % OP SOLN: 1.0000 [drp] | Freq: Four times a day (QID) | OPHTHALMIC | Status: DC | PRN

## 2022-02-13 MED ORDER — MORPHINE BOLUS VIA INFUSION: 5.0000 mg | INTRAVENOUS | Status: DC | PRN

## 2022-02-13 MED ORDER — SODIUM CHLORIDE 0.9 % IV SOLN
INTRAVENOUS | Status: DC
Start: 2022-02-13 — End: 2022-02-15

## 2022-02-13 MED ORDER — GLYCOPYRROLATE 0.2 MG/ML IJ SOLN
0.2000 mg | INTRAMUSCULAR | Status: DC | PRN
  Administered 2022-02-14: 0.2 mg via INTRAVENOUS
  Filled 2022-02-13: qty 1

## 2022-02-13 MED ORDER — GLYCOPYRROLATE 0.2 MG/ML IJ SOLN: 0.2000 mg | INTRAMUSCULAR | Status: DC | PRN

## 2022-02-13 NOTE — Progress Notes (Addendum)
Daily Progress Note   Patient Name: Gerald [name]       Date: 02/13/2022 DOB: December 15, 1980  Age: 41 y.o. MRN#: [id number] Attending Physician: Flora Lipps, MD Primary Care Physician: Patient, [name] Admit Date: 01/17/2022  Reason for Consultation/Follow-up: Establishing goals of care  Subjective: Notes and labs reviewed. In to see patient, mother Mardene Celeste was at bedside. Spoke with mother about current status. She was unable to reach patient's 2 daughters who are 74 years or older. He has one other daughter who is not yet 70 years old. We discussed the importance of having their input. She states she was with them this weekend, but states she does not have their numbers. Patient's mother tells me she calls her daughter (patient's sister) and that daughter contacts patient's children and has them call her if needed. She states "our family is dysfunctional". She revisits that one had a baby via C-section and is still in the hospital, or just discharged today, and the other just turned 41 years old. She revisits their mother's death less than a year ago, and that the oldest daughter had to make decisions to withdraw care herself, as the daughter who is now 62 was only 17 at the time.   We discussed her thoughts on tracheostomy.  She stated she believed she would continue through with the plan, if a tracheostomy is still needed. Wake up assessment and SBT initiated by staff. Patient opened eyes but would not track or follow commands. His heart rate was to 150-170's. He was tugging using accessory muscles and became diaphoretic. SBT and wake up assessment ended.  Patient continued to have severe distress. I remained with mother at the bedside to explain and discuss events to her. Patient desturated into  the 50's and 60's. CCM, RT,and RN working with patient. They continued to work with him manually bagging, lavaging,and placing back to ventilator. Vent changes made to O2 and peep. He remained tachycardic to 130's-150's with WOB noted. Mother and I spoke. We discussed his critical situation and his heart failure, respiratory failure, and renal failure. We discussed no further SBT's or weaning trials, and that even with a tracheostomy, weaning trials could very likely result in distress and suffering. She states this is not what she wants. We discussed really needing to be able to  speak with his daughters as soon as possible.    Mardene Celeste stepped off unit. Staff still working with patient. Mother returned to unit. She had daughters [place] and [place] on the phone. Dr. Mortimer Fries joined a team conversation in conference room with mother, and patient's 2 daughters (both age of consent) on speaker phone. The 2 daughters confirmed who they were. We discussed his suffering and severe distress with SBT. We discussed his chronic issues and prognosis. Patient's mother (their grandmother)  and the patient's daughters spoke.   The daughters both confirmed that they would want their grandmother (patient's mother) Mardene Celeste to make decisions on all care moving forward. From this point, they would like their grandmother to update them as needed. Mardene Celeste confirms she would not want CPR stating if the Reita Cliche takes him now, it's up to him. She also states she will not be proceeding with tracheostomy. Will need to determine date/time of 1 way extubation.  Meeting ended. Stepped back to unit and patient with no distress noted. Staff discusses that patient has Vecuronium in place; discussed with mother what this meant. She requests to be called if there are changes over night and wants family to be able to visit. She states she will return tomorrow.    Length of Stay: 14  Current Medications: Scheduled Meds:   Chlorhexidine Gluconate  Cloth  6 each Topical Q0600   docusate  100 mg Per Tube BID   feeding supplement (PROSource TF)  90 mL Per Tube BID   free water  100 mL Per Tube Q4H   insulin aspart  0-6 Units Subcutaneous Q4H   ipratropium-albuterol  3 mL Nebulization Q6H   metoprolol tartrate  25 mg Oral Q6H   multivitamin  1 tablet Per Tube QHS   mouth rinse  15 mL Mouth Rinse Q2H   pantoprazole sodium  40 mg Per Tube Daily   PHENObarbital  60 mg Per Tube TID   polyethylene glycol  17 g Per Tube Daily   sodium chloride flush  10-40 mL Intracatheter Q12H   thiamine injection  100 mg Intravenous Daily    Continuous Infusions:  sodium chloride Stopped (02/02/22 2010)   feeding supplement (VITAL HIGH PROTEIN) Stopped (02/11/22 1239)   fentaNYL infusion INTRAVENOUS 175 mcg/hr (02/13/22 0718)   heparin 2,650 Units/hr (02/13/22 0629)   norepinephrine (LEVOPHED) Adult infusion 2 mcg/min (02/13/22 0600)   propofol (DIPRIVAN) infusion 45 mcg/kg/min (02/13/22 0716)    PRN Meds: sodium chloride, acetaminophen, bisacodyl, fentaNYL (SUBLIMAZE) injection, heparin, heparin, lidocaine (PF), lidocaine-prilocaine, LORazepam, midazolam, mouth rinse, pentafluoroprop-tetrafluoroeth, sodium chloride flush, vecuronium  Physical Exam Constitutional:      Comments: Alert during SBT and wake up assessment  Pulmonary:     Comments: On ventilator.             Vital Signs: BP 107/66   Pulse 94   Temp 98 F (36.7 C)   Resp 20   Ht 6' (1.829 m)   Wt (!) 144.1 kg   SpO2 100%   BMI 43.09 kg/m  SpO2: SpO2: 100 % O2 Device: O2 Device: Ventilator O2 Flow Rate: O2 Flow Rate (L/min): 45 L/min  Intake/output summary:  Intake/Output Summary (Last 24 hours) at 02/13/2022 0854 Last data filed at 02/13/2022 0600 Gross per 24 hour  Intake 2004.23 ml  Output 1589 ml  Net 415.23 ml   LBM: Last BM Date : 02/05/22 Baseline Weight: Weight: (!) 140 kg Most recent weight: Weight: (!) 144.1 kg     Patient Active  Problem List    Diagnosis Date Noted   ESRD (end stage renal disease) on dialysis [facility])    Encounter for continuous renal replacement therapy (CRRT) for acute renal failure ([place])    Acute respiratory failure ([place]) 01/29/2022   Acute respiratory failure with hypoxemia ([place]) 02/09/2022   Atrial fibrillation with rapid ventricular response ([place]) 11/02/2021   Severe uncontrolled hypertension 09/05/2021   Restless leg 07/08/2021   Acute on chronic congestive heart failure ([place]) 03/30/2021   Cocaine abuse ([place]) 01/20/2021   Obesity, Class III, BMI 40-49.9 (morbid obesity) ([place]) 01/20/2021   Hypertensive emergency 12/21/2020   Stage 3a chronic kidney disease (CKD) ([place]) - baseline SCr 1.8-2.0 12/21/2020   PAF (paroxysmal atrial fibrillation) ([place]) 07/05/2020   Elevated troponin 07/05/2020   Microcytic anemia 07/05/2020   Hypokalemia 07/05/2020   Acute on chronic combined systolic and diastolic CHF (congestive heart failure) ([place])    Atrial flutter ([place])    Essential hypertension    Acute respiratory distress 04/01/2019   Community acquired pneumonia of right lung 12/21/2015    Palliative Care Assessment & Plan     Recommendations/Plan: No CPR. No tracheostomy. Will need to determine time for 1 way extubation.   Code Status:    Code Status Orders  (From admission, onward)           Start     Ordered   02/12/22 1612  Do not attempt resuscitation (DNR)  Continuous       Question Answer Comment  In the event of cardiac or respiratory ARREST Do not call a "code blue"   In the event of cardiac or respiratory ARREST Do not perform Intubation, CPR, defibrillation or ACLS   In the event of cardiac or respiratory ARREST Use medication by any route, position, wound care, and other measures to relive pain and suffering. May use oxygen, suction and manual treatment of airway obstruction as needed for comfort.      02/12/22 1612           Code Status History     Date Active Date Inactive Code  Status Order ID Comments User Context   02/11/2022 0432 02/12/2022 1612 Full Code [id number]  Rise Patience, MD Inpatient   11/02/2021 0618 11/05/2021 2133 Full Code [id number]  Kristopher Oppenheim, DO ED   09/05/2021 1100 09/09/2021 2218 Full Code [id number]  Marianna Payment, MD ED   07/07/2021 1324 07/07/2021 1325 Full Code [id number]  Karmen Bongo, MD ED   07/07/2021 1324 07/07/2021 1324 Full Code [id number]  Karmen Bongo, MD ED   05/30/2021 1110 06/03/2021 2129 Full Code [id number]  Karmen Bongo, MD ED   03/30/2021 1254 04/02/2021 2219 Full Code [id number]  Lequita Halt, MD ED   01/20/2021 1204 01/23/2021 1754 Full Code [id number]  Karmen Bongo, MD ED   12/21/2020 1243 12/24/2020 2054 Full Code [id number]  Edwin Dada, MD Inpatient   07/05/2020 0539 07/07/2020 2134 Full Code [id number]  Vianne Bulls, MD ED   04/01/2019 0949 04/09/2019 1527 Full Code [id number]  Dustin Flock, MD ED   12/21/2015 1501 12/23/2015 1607 Full Code [id number]  Demetrios Loll, MD Inpatient       Prognosis: Extremely poor   With patient and/or mother from 1:45-4:15 prior to SBT and distress, during this time when the situation was tenuous, afterwards with family conversation and then following when we returned to room. Care plan was discussed with CCM  Thank you for allowing the Palliative Medicine Team to assist in the care  of this patient.    Asencion Gowda, NP  Please contact Palliative Medicine Team phone at [phone] for questions and concerns.

## 2022-02-13 NOTE — Progress Notes (Signed)
NAME:  Gerald [name], MRN:  [id number], DOB:  21-Feb-1981, LOS: 20 ADMISSION DATE:  01/29/2022 CONSULTATION DATE:  02/13/2022  Brief patient description Gerald [name]:  41 yo morbidly obese AAM with acute sCHF exacerbation with acute hypoxic resp failure with pulm edema and renal failure leading to severe resp failure due to severe end stage nonischemic sCHF exacerbation leading to progressive cardiorenal syndrome with renal failure requiring CRRT  History of Present Illness:  41 y.o. male with history of chronic systolic and diastolic CHF last EF measured was 20 to 25% in January 2023 with history of hypertension, chronic kidney disease stage III baseline creatinine around 2,  anemia polysubstance abuse admits to taking cocaine presents to the ER because of worsening shortness of breath for the last 3 days with no associated chest pain has some productive cough.   Patient states he has not taken his medicines for last 3 days.  Admitted to [facility] with SOB   ED Course: In the ER patient was hypoxic initially requiring BiPAP but after placing on BiPAP patient had vomiting and had to be taken off the BiPAP was placed on high flow oxygen.  Patient chest x-ray shows congestion was placed on IV Lasix and also empirically on antibiotics.   BNP 790 high sensitive troponin was 553 and 4 and 72 was started on IV heparin.  Patient admitted for acute hypoxic respiratory failure likely from CHF.  Since patient was in A-fib with RVR initially was started on Cardizem infusion.  Transition to amiodarone.  PCCM consulted to assess worsening SOB and increased WOB  6/15  Significant Hospital Events: Including procedures, antibiotic start and stop dates in addition to other pertinent events   6/14 admitted for acute sCHF 6/15 PCCM consulted for impending resp failure 02-19-2023 patient coded for 1 round with the intubation tried from respiratory arrest 06/17 patient is anuric dialysis catheter is and nephrology is involved  he might need CRRT or HD 06/18 increase in liver enzymes to above 1000 we will hold amiodarone and atorvastatin 6/19 multiorgan failure, on CRRT  6/20: Failed SBT due to delirium and increased work of breathing, tachycardia, diaphoresis.  Transitioned from CRRT to intermittent HD (plan for first HD session 6/21) 6/21: Plan for HD this morning, then will attempt SBT.  Precedex as needed for agitation/delirium with wake up assessment 6/22: Pt remains mechanically intubated requiring minimal ventilator settings.  Sedated with precedex and fentanyl gtts agitation/delirium improving. Contacted nephrology requesting HD session today prior to SBT trial.  Low-grade fevers overnight blood and respiratory cultures pending  6/23: Fevers have slightly improved.  Pt had a period of agitation requiring 1x dose of 2 mg iv versed.  Will repeat WUA in an attempt to perform SBT  6/24: Persistent issues with agitation, increased issues with arrhythmia, beta-blockers increased.  Phenobarbital protocol for ICU delirium.  Thiamine 6/25: persistent delirium, switched to Propofol, will need tracheostomy to wean 6/26 extubated due mucus plugs in ETT, re-intubated without difficulty +UPPER AIRWAY SWELLING 6/27 remains intubated on vent 6/28 near Cardiac arrest after SAT SBT trial, mother with patient consented to DNR status  Micro Data:  6/15: SARS-CoV-2 and influenza PCR>> negative 6/15: Blood culture x2>> no growth 6/15: MRSA PCR>> negative 6/15: Sputum>> normal respiratory flora 6/15: Strep pneumo and Legionella urinary antigens>> negative 2023-02-19: Mycoplasma pneumonia a IgM>> less than 770 6/18: Hepatitis B>> nonreactive 6/22: Blood x2>>negative  6/22: Respiratory>>  Antimicrobials:   Antibiotics Given (last 72 hours)     Date/Time Action  Medication Dose Rate   02/10/22 1952 New Bag/Given   ceFEPIme (MAXIPIME) 1 g in sodium chloride 0.9 % 100 mL IVPB 1 g 200 mL/hr       Interim History / Subjective:   Severe hypoxia  Remains intubated  Multiorgan failure  Remains critically ill  New cardiac arrest yesterday  Patient is now DNR status  Lurline Idol has been canceled  Patient has multiorgan failure and in the dying process     Objective   Blood pressure 107/66, pulse 94, temperature 98 F (36.7 C), resp. rate 20, height 6' (1.829 m), weight (!) 144.1 kg, SpO2 100 %.    Vent Mode: PRVC FiO2 (%):  [30 %-100 %] 80 % Set Rate:  [20 bmp] 20 bmp Vt Set:  [550 mL] 550 mL PEEP:  [5 cmH20-26 cmH20] 26 cmH20 Plateau Pressure:  [20 cmH20] 20 cmH20   Intake/Output Summary (Last 24 hours) at 02/13/2022 0754 Last data filed at 02/13/2022 0600 Gross per 24 hour  Intake 2311.05 ml  Output 1764 ml  Net 547.05 ml    Filed Weights   02/12/22 2032 02/12/22 2302 02/13/22 0500  Weight: (!) 147 kg (!) 144.1 kg (!) 144.1 kg    REVIEW OF SYSTEMS  PATIENT IS UNABLE TO PROVIDE COMPLETE REVIEW OF SYSTEMS DUE TO SEVERE CRITICAL ILLNESS    PHYSICAL EXAMINATION:  GENERAL:critically ill appearing, +resp distress EYES: Pupils equal, round, reactive to light.  No scleral icterus.  MOUTH: Moist mucosal membrane. INTUBATED NECK: Supple.  PULMONARY: +rhonchi, +wheezing CARDIOVASCULAR: S1 and S2.  No murmurs  GASTROINTESTINAL: Soft, nontender, -distended. Positive bowel sounds.  MUSCULOSKELETAL: No swelling, clubbing, or edema.  NEUROLOGIC: obtunded SKIN:intact,warm,dry     Labs/imaging that I havepersonally reviewed  (right click and "Reselect all SmartList Selections" daily)  All labs reviewed 02/08/22  Labs   CBC: Recent Labs  Lab 02/09/22 0330 02/10/22 0329 02/11/22 0330 02/12/22 0503 02/13/22 0452  WBC 8.2 7.9 7.0 5.6 7.0  NEUTROABS 5.6 4.4 5.0 3.5 4.9  HGB 8.9* 8.0* 8.1* 7.8* 8.1*  HCT 29.1* 26.6* 26.9* 23.9* 23.6*  MCV 66.3* 67.0* 66.6* 66.0* 66.7*  PLT 190 198 221 264 317     Basic Metabolic Panel: Recent Labs  Lab 02/09/22 0330 02/10/22 0329 02/11/22 0330  02/12/22 0503 02/12/22 0518 02/13/22 0452  NA 133* 135 135  --  134* 131*  K 5.1 4.7 4.8  --  4.3 4.5  CL 99 98 96*  --  95* 92*  CO2 19* 19* 23  --  23 25  GLUCOSE 143* 98 107*  --  91 89  BUN 119* 136* 98*  --  118* 88*  CREATININE 7.81* 8.75* 6.36*  --  6.98* 6.46*  CALCIUM 9.2 9.1 8.9  --  8.9 8.6*  MG 2.7* 2.8* 2.8* 2.8*  --  2.7*  PHOS 11.4* >30.0* 9.3*  --  9.4* 9.8*    GFR: Estimated Creatinine Clearance: 22.2 mL/min (A) (by C-G formula based on SCr of 6.46 mg/dL (H)). Recent Labs  Lab 02/06/22 1539 02/04/2022 0333 02/10/22 0329 02/11/22 0330 02/12/22 0503 02/13/22 0452  PROCALCITON 5.92  --  1.71 1.20  --   --   WBC  --    < > 7.9 7.0 5.6 7.0   < > = values in this interval not displayed.     Liver Function Tests: Recent Labs  Lab 02/09/22 0330 02/10/22 0329 02/11/22 0330 02/12/22 0518 02/13/22 0452  AST  --  39  --   --   --  ALT  --  164*  --   --   --   ALKPHOS  --  88  --   --   --   BILITOT  --  0.6  --   --   --   PROT  --  6.9  --   --   --   ALBUMIN 2.5* 2.4*  2.5* 2.8* 2.6* 2.6*    No results for input(s): "LIPASE", "AMYLASE" in the last 168 hours. No results for input(s): "AMMONIA" in the last 168 hours.   ABG    Component Value Date/Time   PHART 7.29 (L) 02/12/2022 1530   PCO2ART 51 (H) 02/12/2022 1530   PO2ART 84 02/12/2022 1530   HCO3 24.5 02/12/2022 1530   ACIDBASEDEF 2.6 (H) 02/12/2022 1530   O2SAT 96.2 02/12/2022 1530   HbA1C: Hgb A1c MFr Bld  Date/Time Value Ref Range Status  09/05/2021 05:00 PM 6.1 (H) 4.8 - 5.6 % Final    Comment:    (NOTE)         Prediabetes: 5.7 - 6.4         Diabetes: >6.4         Glycemic control for adults with diabetes: <7.0   03/30/2021 08:25 PM 6.2 (H) 4.8 - 5.6 % Final    Comment:    (NOTE) Pre diabetes:          5.7%-6.4%  Diabetes:              >6.4%  Glycemic control for   <7.0% adults with diabetes     CBG: Recent Labs  Lab 02/12/22 1634 02/12/22 1943 02/12/22 2354  02/13/22 0413 02/13/22 0729  GLUCAP 113* 114* 103* 103* 103*    Scheduled Meds:  alteplase  2 mg Intracatheter Once   bisacodyl  10 mg Rectal Once   Chlorhexidine Gluconate Cloth  6 each Topical Q0600   docusate  100 mg Per Tube BID   feeding supplement (PROSource TF)  90 mL Per Tube BID   free water  100 mL Per Tube Q4H   insulin aspart  0-6 Units Subcutaneous Q4H   ipratropium-albuterol  3 mL Nebulization Q6H   metoprolol tartrate  25 mg Oral Q6H   multivitamin  1 tablet Per Tube QHS   mouth rinse  15 mL Mouth Rinse Q2H   pantoprazole sodium  40 mg Per Tube Daily   PHENObarbital  60 mg Per Tube TID   polyethylene glycol  17 g Per Tube Daily   sodium chloride flush  10-40 mL Intracatheter Q12H   thiamine injection  100 mg Intravenous Daily   Continuous Infusions:  sodium chloride Stopped (02/02/22 2010)   feeding supplement (VITAL HIGH PROTEIN) Stopped (02/11/22 1239)   fentaNYL infusion INTRAVENOUS 175 mcg/hr (02/13/22 0718)   heparin 2,650 Units/hr (02/13/22 0629)   norepinephrine (LEVOPHED) Adult infusion 2 mcg/min (02/13/22 0600)   propofol (DIPRIVAN) infusion 45 mcg/kg/min (02/13/22 0716)   PRN Meds:.sodium chloride, acetaminophen, bisacodyl, fentaNYL (SUBLIMAZE) injection, heparin, heparin, lidocaine (PF), lidocaine-prilocaine, LORazepam, midazolam, mouth rinse, pentafluoroprop-tetrafluoroeth, sodium chloride flush, vecuronium      ASSESSMENT AND PLAN 41 yo morbidly obese AAM with end stage nonischemic cardiomyopathy with acute systolic heart failure leading to progressive cardiorenal syndrome failure to wean from vent with near cardiac arrest event with significant cardiac failure and failure to wean from the ventilator   Severe ACUTE Hypoxic and Hypercapnic Respiratory Failure -continue Mechanical Ventilator support -Wean Fio2 and PEEP as tolerated -VAP/VENT bundle implementation - Wean  PEEP & FiO2 as tolerated, maintain SpO2 > 88% - Head of bed elevated 30  degrees, VAP protocol in place - Plateau pressures less than 30 cm H20  - Intermittent chest x-ray & ABG PRN - Ensure adequate pulmonary hygiene  -will NOT perform SAT/SBT  Patient with near cardiac arrest check has been canceled Recommend multiple measures  Severe acute cardiac failure EF 25% with A-fib RVR Follow-up cardiology recommendations Continue anticoagulation Vent support  ACUTE KIDNEY INJURY/Renal Failure -continue Foley Catheter-assess need -Avoid nephrotoxic agents -Follow urine output, BMP -Ensure adequate renal perfusion, optimize oxygenation -Renal dose medications   Intake/Output Summary (Last 24 hours) at 02/13/2022 0758 Last data filed at 02/13/2022 0600 Gross per 24 hour  Intake 2311.05 ml  Output 1764 ml  Net 547.05 ml   HEMOPTYSIS AND BLEEDING FROM LUNGS WILL NEED TO STOP HEPARIN INFUSION   ENDO - ICU hypoglycemic\Hyperglycemia protocol -check FSBS per protocol   GI GI PROPHYLAXIS as indicated  NUTRITIONAL STATUS DIET-->TF's as tolerated Constipation protocol as indicated   ELECTROLYTES -follow labs as needed -replace as needed -pharmacy consultation and following    NEUROLOGY ACUTE  METABOLIC ENCEPHALOPATHY -need for sedation -Goal RASS -2 to -3 PMHx: Polysubstance abuse UDS negative  -Avoid sedating medications as able -Discontinue trazodone, Klonopin, hydrocodone -not effective -On phenobarb twice daily -Thiamine supplementation -Daily wake up assessment    Best practice (right click and "Reselect all SmartList Selections" daily)  Diet: Tube feeds DVT prophylaxis:STOPPED DUE TO BLEEDING FROM LUNGS Foley: Yes and still needed  Mobility:  bed rest  Code Status:  DNR Disposition: ICU     DVT/GI PRX  assessed I Assessed the need for Labs I Assessed the need for Foley I Assessed the need for Central Venous Line Family Discussion when available I Assessed the need for Mobilization I made an Assessment of medications  to be adjusted accordingly Safety Risk assessment completed  CASE DISCUSSED IN MULTIDISCIPLINARY ROUNDS WITH ICU TEAM     Critical Care Time devoted to patient care services described in this note is 55 minutes.  Critical care was necessary to treat /prevent imminent and life-threatening deterioration. Overall, patient is critically ill, prognosis is guarded.  Patient with Multiorgan failure and at high risk for cardiac arrest and death.  Recommend comfort care measures and withdrawal of care   Corrin Parker, M.D.  [facility]  Medical Director [place] Director [facility] Cardio-Pulmonary Department

## 2022-02-13 NOTE — Progress Notes (Addendum)
Gerald [name], [place] 02/13/22  Subjective:   Hospital day # 14  Patient critically ill. Sedation with fentanyl and propofol  cvs: No pressors, heparin drip stopped pulm: Ventilator assisted.  FiO2 80% with 10 PEEP gi: Tube feeds - held Renal: Foley in place - 754ml in proceeding 24 hours.   06/28 0701 - 06/29 0700 In: 2311.1 [I.V.:2044.1; IV Piggyback:66.9] Out: 8127 [Urine:775; Emesis/NG output:725] Lab Results  Component Value Date   CREATININE 6.46 (H) 02/13/2022   CREATININE 6.98 (H) 02/12/2022   CREATININE 6.36 (H) 02/11/2022     Objective:  Vital signs in last 24 hours:  Temp:  [96.1 F (35.6 C)-99.3 F (37.4 C)] 98 F (36.7 C) (06/29 0700) Pulse Rate:  [25-126] 80 (06/29 1100) Resp:  [10-29] 18 (06/29 1100) BP: (80-160)/(49-131) 86/68 (06/29 1100) SpO2:  [80 %-100 %] 99 % (06/29 1208) FiO2 (%):  [30 %-100 %] 70 % (06/29 1208) Weight:  [144.1 kg-147 kg] 144.1 kg (06/29 0500)  Weight change: -1.5 kg Filed Weights   02/12/22 2032 02/12/22 2302 02/13/22 0500  Weight: (!) 147 kg (!) 144.1 kg (!) 144.1 kg    Intake/Output:    Intake/Output Summary (Last 24 hours) at 02/13/2022 1224 Last data filed at 02/13/2022 1108 Gross per 24 hour  Intake 2153.75 ml  Output 1589 ml  Net 564.75 ml      Physical Exam: General: Critically ill-appearing, laying in the bed  HEENT ET tube, OG tube in place  Pulm/lungs Ventilator assisted  CVS/Heart Atrial fibrillation, irregular  Abdomen:  Soft  Extremities: Some dependent edema present  Neurologic: Sedation, responds to name  Skin: Warm, dry  Access: Right IJ temp catheter in place, Rt chest permcath       Basic Metabolic Panel:  Recent Labs  Lab 02/09/22 0330 02/10/22 0329 02/11/22 0330 02/12/22 0503 02/12/22 0518 02/13/22 0452  NA 133* 135 135  --  134* 131*  K 5.1 4.7 4.8  --  4.3 4.5  CL 99 98 96*  --  95* 92*  CO2 19* 19* 23  --  23 25  GLUCOSE 143* 98 107*  --  91 89   BUN 119* 136* 98*  --  118* 88*  CREATININE 7.81* 8.75* 6.36*  --  6.98* 6.46*  CALCIUM 9.2 9.1 8.9  --  8.9 8.6*  MG 2.7* 2.8* 2.8* 2.8*  --  2.7*  PHOS 11.4* >30.0* 9.3*  --  9.4* 9.8*      CBC: Recent Labs  Lab 02/09/22 0330 02/10/22 0329 02/11/22 0330 02/12/22 0503 02/13/22 0452  WBC 8.2 7.9 7.0 5.6 7.0  NEUTROABS 5.6 4.4 5.0 3.5 4.9  HGB 8.9* 8.0* 8.1* 7.8* 8.1*  HCT 29.1* 26.6* 26.9* 23.9* 23.6*  MCV 66.3* 67.0* 66.6* 66.0* 66.7*  PLT 190 198 221 264 317       Lab Results  Component Value Date   HEPBSAG NON REACTIVE 02/04/2022   HEPBSAB NON REACTIVE 02/04/2022   HEPBIGM NON REACTIVE 02/02/2022      Microbiology:  Recent Results (from the past 240 hour(s))  Culture, blood (Routine X 2) w Reflex to ID Panel     Status: None   Collection Time: 02/06/22  8:34 AM   Specimen: BLOOD  Result Value Ref Range Status   Specimen Description BLOOD LEFT WRIST  Final   Special Requests   Final    BOTTLES DRAWN AEROBIC AND ANAEROBIC Blood Culture results may not be optimal due to an inadequate volume of  blood received in culture bottles   Culture   Final    NO GROWTH 5 DAYS Performed at [facility], [address] [place], [place] [address]    Report Status 02/11/2022 FINAL  Final  Culture, blood (Routine X 2) w Reflex to ID Panel     Status: None   Collection Time: 02/06/22  3:39 PM   Specimen: BLOOD  Result Value Ref Range Status   Specimen Description BLOOD [facility]  Final   Special Requests BOTTLES DRAWN AEROBIC AND ANAEROBIC BCAV  Final   Culture   Final    NO GROWTH 5 DAYS Performed at [name], [address] [place], [place] [address]    Report Status 02/11/2022 FINAL  Final    Coagulation Studies: No results for input(s): "LABPROT", "INR" in the last 72 hours.   Urinalysis: No results for input(s): "COLORURINE", "LABSPEC", "PHURINE", "GLUCOSEU", "HGBUR", "BILIRUBINUR", "KETONESUR", "PROTEINUR", "UROBILINOGEN", "NITRITE",  "LEUKOCYTESUR" in the last 72 hours.  Invalid input(s): "APPERANCEUR"     Imaging: DG Chest Port 1 View  Result Date: 02/12/2022 CLINICAL DATA:  Respiratory distress. EXAM: PORTABLE CHEST 1 VIEW COMPARISON:  02/11/2022 FINDINGS: The right IJ dialysis catheter is stable. The endotracheal tube is 6 cm above the carina. The NG tube is coursing down the esophagus and into the stomach. An esophageal temperature probe is noted. Stable mild cardiac enlargement. Interval slight worsening right lung aeration possibly progressive atelectasis or infiltrate. No large pleural effusion. IMPRESSION: 1. Stable support apparatus. 2. Slight worsening right lung aeration possibly progressive atelectasis or infiltrate. Electronically Signed   By: Marijo Sanes M.D.   On: 02/12/2022 15:51   ECHOCARDIOGRAM LIMITED  Result Date: 02/12/2022    ECHOCARDIOGRAM LIMITED REPORT   Patient Name:   Gerald [name] Date of Exam: 02/12/2022 Medical Rec #:  [id number]        Height:       72.0 in Accession #:    [id number]       Weight:       327.4 lb Date of Birth:  1980-09-19        BSA:          2.627 m Patient Age:    41 years         BP:           118/76 mmHg Patient Gender: M                HR:           103 bpm. Exam Location:  [facility] Procedure: Limited Echo, Limited Color Doppler and Cardiac Doppler Indications:     I50.21 congestive heart failure-Acute Systolic  History:         Patient has prior history of Echocardiogram examinations, most                  recent 09/05/2021. CKD; Risk Factors:Hypertension. Hx of                  substance abuse.  Sonographer:     Charmayne Sheer Referring Phys:  [id number] SHERI HAMMOCK Diagnosing Phys: Kate Sable MD IMPRESSIONS  1. Left ventricular ejection fraction, by estimation, is 20 to 25%. The left ventricle has severely decreased function. There is moderate left ventricular hypertrophy.  2. Right ventricular systolic function is normal. The right ventricular size is normal.  3. The aortic  valve is tricuspid. FINDINGS  Left Ventricle: Left ventricular ejection fraction, by estimation, is 20 to  25%. The left ventricle has severely decreased function. There is moderate left ventricular hypertrophy. Right Ventricle: The right ventricular size is normal. Right ventricular systolic function is normal. Tricuspid Valve: The tricuspid valve is normal in structure. Tricuspid valve regurgitation is mild. Aortic Valve: The aortic valve is tricuspid. LEFT VENTRICLE PLAX 2D LVIDd:         6.27 cm LVIDs:         5.30 cm LV PW:         1.84 cm LV IVS:        1.67 cm  LV Volumes (MOD) LV vol d, MOD A2C: 175.0 ml LV vol d, MOD A4C: 235.0 ml LV vol s, MOD A2C: 107.0 ml LV vol s, MOD A4C: 148.0 ml LV SV MOD A2C:     68.0 ml LV SV MOD A4C:     235.0 ml LV SV MOD BP:      89.3 ml LEFT ATRIUM         Index LA diam:    4.00 cm 1.52 cm/m Kate Sable MD Electronically signed by Kate Sable MD Signature Date/Time: 02/12/2022/1:00:03 PM    Final    DG Chest Port 1 View  Result Date: 02/11/2022 CLINICAL DATA:  PICC line placement EXAM: PORTABLE CHEST 1 VIEW COMPARISON:  02/10/2022 FINDINGS: Transverse diameter of heart is increased. Central pulmonary vessels are slightly less prominent. There are no signs of alveolar pulmonary edema or new focal infiltrates. Costophrenic angles are clear. There is no pneumothorax. Tip of endotracheal tube is 4.5 cm above the carina. Enteric tube is noted traversing the esophagus. Tip of dialysis catheter is seen in the region of right atrium. There is interval placement of PICC line through the right upper extremity with its distal course in the superior vena cava. IMPRESSION: Tip of PICC line is seen in the course of superior vena cava. Cardiomegaly. There is partial clearing of pulmonary vascular congestion. Electronically Signed   By: Elmer Picker M.D.   On: 02/11/2022 15:10   DG Abd 1 View  Result Date: 02/11/2022 CLINICAL DATA:  Vomiting EXAM: ABDOMEN - 1 VIEW  COMPARISON:  02/10/2022 FINDINGS: Tip of enteric tube is seen in the region of body of stomach. Bowel gas pattern is nonspecific. There is no significant small bowel dilation. There is interval decrease in amount of gas in the stomach. Transverse diameter of heart is increased. IMPRESSION: Tip of enteric tube is seen in the stomach. Nonspecific bowel gas pattern. Electronically Signed   By: Elmer Picker M.D.   On: 02/11/2022 15:07     Medications:    sodium chloride Stopped (02/02/22 2010)   sodium chloride     feeding supplement (VITAL HIGH PROTEIN) Stopped (02/11/22 1239)   fentaNYL infusion INTRAVENOUS 175 mcg/hr (02/13/22 1108)   morphine     norepinephrine (LEVOPHED) Adult infusion 2 mcg/min (02/13/22 1108)   propofol (DIPRIVAN) infusion 45 mcg/kg/min (02/13/22 1108)    Chlorhexidine Gluconate Cloth  6 each Topical Q0600   docusate  100 mg Per Tube BID   feeding supplement (PROSource TF)  90 mL Per Tube BID   free water  100 mL Per Tube Q4H   insulin aspart  0-6 Units Subcutaneous Q4H   ipratropium-albuterol  3 mL Nebulization Q6H   metoprolol tartrate  12.5 mg Oral Q6H   multivitamin  1 tablet Per Tube QHS   mouth rinse  15 mL Mouth Rinse Q2H   pantoprazole sodium  40 mg Per Tube Daily  PHENObarbital  60 mg Per Tube TID   polyethylene glycol  17 g Per Tube Daily   sodium chloride flush  10-40 mL Intracatheter Q12H   thiamine injection  100 mg Intravenous Daily   sodium chloride, acetaminophen **OR** acetaminophen, bisacodyl, fentaNYL (SUBLIMAZE) injection, glycopyrrolate **OR** glycopyrrolate **OR** glycopyrrolate, heparin, heparin, lidocaine (PF), lidocaine-prilocaine, LORazepam, midazolam, morphine, mouth rinse, pentafluoroprop-tetrafluoroeth, polyvinyl alcohol, sodium chloride flush, vecuronium  Assessment/ Plan:  41 y.o. male with   chronic systolic and diastolic CHF, hypertension, anemia, polysubstance abuse (cocaine and tobacco), chronic kidney disease, non-STEMI  May 2023, A-fib with RVR admitted on 01/21/2022 for Acute respiratory failure ([name]) [J96.00] Acute respiratory failure with hypoxia ([name]) [J96.01] Atrial fibrillation with RVR ([place]) [I48.91] Community acquired pneumonia of right lung, unspecified part of lung [J18.9] Acute on chronic congestive heart failure, unspecified heart failure type ([place]) [I50.9] Acute respiratory failure with hypoxemia ([name]) [J96.01]  Acute kidney injury on chronic kidney disease stage IIIb. Baseline creatinine 2.03/GFR 42 on November 04, 2021. Renal ultrasound without obstruction.  No IV contrast exposure.AKI likely secondary to cardiorenal syndrome.  CRRT started on June 17 and stopped on June 20.  Received dialysis yesterday, low UF. Failed weaning trial may result in comfort care transition later today.   Acute respiratory failure Currently ventilator assisted.   CAP treated with antibiotics  Weaning trial failed yesterday with mother at bedside  Acute exacerbation of chronic systolic and diastolic CHF 2D echo September 05, 2021-severe global reduction in LV function, severe LVH, EF 20 to 25%, global hypokinesis, grade 3 diastolic dysfunction  Patient has been transitioned to comfort care at the time of this note. Will sign off at this time.   LOS: [place] 6/29/202312:24 [place], [place]

## 2022-02-13 NOTE — Progress Notes (Signed)
Daily Progress Note   Patient Name: Gerald [name]       Date: 02/13/2022 DOB: 26-Oct-1980  Age: 41 y.o. MRN#: [id number] Attending Physician: Flora Lipps, MD Primary Care Physician: Patient, [name] Admit Date: 02/03/2022  Reason for Consultation/Follow-up: Establishing goals of care  Subjective: Notes reviewed. In to unit to see patient, no family currently at bedside. He is resting in bed on ventilator. Per staff, patient is a CDS patient. [employer] is now present and is waiting for medical personal from his company. Per staff, mother has consented to donation.   CCM will manage care from here.    Length of Stay: 14  Current Medications: Scheduled Meds:   Chlorhexidine Gluconate Cloth  6 each Topical Q0600   docusate  100 mg Per Tube BID   feeding supplement (PROSource TF)  90 mL Per Tube BID   free water  100 mL Per Tube Q4H   insulin aspart  0-6 Units Subcutaneous Q4H   ipratropium-albuterol  3 mL Nebulization Q6H   metoprolol tartrate  12.5 mg Oral Q6H   multivitamin  1 tablet Per Tube QHS   mouth rinse  15 mL Mouth Rinse Q2H   pantoprazole sodium  40 mg Per Tube Daily   PHENObarbital  60 mg Per Tube TID   polyethylene glycol  17 g Per Tube Daily   sodium chloride flush  10-40 mL Intracatheter Q12H   thiamine injection  100 mg Intravenous Daily    Continuous Infusions:  sodium chloride Stopped (02/02/22 2010)   sodium chloride     feeding supplement (VITAL HIGH PROTEIN) Stopped (02/11/22 1239)   fentaNYL infusion INTRAVENOUS 175 mcg/hr (02/13/22 1108)   morphine     norepinephrine (LEVOPHED) Adult infusion 2 mcg/min (02/13/22 1108)   propofol (DIPRIVAN) infusion 45 mcg/kg/min (02/13/22 1439)    PRN Meds: sodium chloride, acetaminophen **OR** acetaminophen,  bisacodyl, fentaNYL (SUBLIMAZE) injection, glycopyrrolate **OR** glycopyrrolate **OR** glycopyrrolate, heparin, heparin, lidocaine (PF), lidocaine-prilocaine, LORazepam, midazolam, morphine, mouth rinse, pentafluoroprop-tetrafluoroeth, polyvinyl alcohol, sodium chloride flush, vecuronium  Physical Exam Constitutional:      Comments: Eyes closed. On ventilator.              Vital Signs: BP 97/61   Pulse (!) 104   Temp 97.7 F (36.5 C)   Resp 17  Ht 6' (1.829 m)   Wt (!) 144.1 kg   SpO2 99%   BMI 43.09 kg/m  SpO2: SpO2: 99 % O2 Device: O2 Device: Ventilator O2 Flow Rate: O2 Flow Rate (L/min): 45 L/min  Intake/output summary:  Intake/Output Summary (Last 24 hours) at 02/13/2022 1551 Last data filed at 02/13/2022 1108 Gross per 24 hour  Intake 2153.75 ml  Output 1739 ml  Net 414.75 ml   LBM: Last BM Date : 02/05/22 Baseline Weight: Weight: (!) 140 kg Most recent weight: Weight: (!) 144.1 kg      Patient Active Problem List   Diagnosis Date Noted   ESRD (end stage renal disease) on dialysis [facility])    Encounter for continuous renal replacement therapy (CRRT) for acute renal failure ([place])    Acute respiratory failure ([place]) 02/13/2022   Acute respiratory failure with hypoxemia ([place]) 02/10/2022   Atrial fibrillation with rapid ventricular response ([place]) 11/02/2021   Severe uncontrolled hypertension 09/05/2021   Restless leg 07/08/2021   Acute on chronic congestive heart failure ([place]) 03/30/2021   Cocaine abuse ([place]) 01/20/2021   Obesity, Class III, BMI 40-49.9 (morbid obesity) ([place]) 01/20/2021   Hypertensive emergency 12/21/2020   Stage 3a chronic kidney disease (CKD) ([place]) - baseline SCr 1.8-2.0 12/21/2020   PAF (paroxysmal atrial fibrillation) ([place]) 07/05/2020   Elevated troponin 07/05/2020   Microcytic anemia 07/05/2020   Hypokalemia 07/05/2020   Acute on chronic combined systolic and diastolic CHF (congestive heart failure) ([place])    Atrial flutter ([place])    Essential  hypertension    Acute respiratory distress 04/01/2019   Community acquired pneumonia of right lung 12/21/2015    Palliative Care Assessment & Plan    Recommendations/Plan: Kelli Churn is present and waiting for additional staff from their company.  CCM will mange care from here.  PMT will sign off.   Code Status:    Code Status Orders  (From admission, onward)           Start     Ordered   02/13/22 1208  DNR (Do not attempt resuscitation)  Continuous       Question Answer Comment  In the event of cardiac or respiratory ARREST Do not call a "code blue"   In the event of cardiac or respiratory ARREST Do not perform Intubation, CPR, defibrillation or ACLS   In the event of cardiac or respiratory ARREST Use medication by any route, position, wound care, and other measures to relive pain and suffering. May use oxygen, suction and manual treatment of airway obstruction as needed for comfort.      02/13/22 1209           Code Status History     Date Active Date Inactive Code Status Order ID Comments User Context   02/12/2022 1612 02/13/2022 1209 DNR [id number]  Flora Lipps, MD Inpatient   01/29/2022 0432 02/12/2022 1612 Full Code [id number]  Rise Patience, MD Inpatient   11/02/2021 0618 11/05/2021 2133 Full Code [id number]  Kristopher Oppenheim, DO ED   09/05/2021 1100 09/09/2021 2218 Full Code [id number]  Marianna Payment, MD ED   07/07/2021 1324 07/07/2021 1325 Full Code [id number]  Karmen Bongo, MD ED   07/07/2021 1324 07/07/2021 1324 Full Code [id number]  Karmen Bongo, MD ED   05/30/2021 1110 06/03/2021 2129 Full Code [id number]  Karmen Bongo, MD ED   03/30/2021 1254 04/02/2021 2219 Full Code [id number]  Lequita Halt, MD ED   01/20/2021 1204 01/23/2021 1754 Full Code [id number]  Karmen Bongo,  MD ED   12/21/2020 1243 12/24/2020 2054 Full Code [id number]  Edwin Dada, MD Inpatient   07/05/2020 0539 07/07/2020 2134 Full Code [id number]  Vianne Bulls, MD ED   04/01/2019 0949  04/09/2019 1527 Full Code [id number]  Dustin Flock, MD ED   12/21/2015 1501 12/23/2015 1607 Full Code [id number]  Demetrios Loll, MD Inpatient       Care plan was discussed with CCM and [facility]   Thank you for allowing the Palliative Medicine Team to assist in the care of this patient.   Asencion Gowda, NP  Please contact Palliative Medicine Team phone at [phone] for questions and concerns.

## 2022-02-13 NOTE — IPAL (Signed)
  Interdisciplinary Goals of Care Family Meeting   Date carried out: 02/13/2022  Location of the meeting: Bedside  Member's involved: Physician, Bedside Registered Nurse, and Family Member or next of kin       GOALS OF CARE DISCUSSION  The Clinical status was relayed to family in [place]  Updated and notified of patients medical condition- Patient remains unresponsive and will not open eyes to command.   Patient is having a weak cough and struggling to remove secretions.   Patient with increased WOB and using accessory muscles to breathe Explained to family course of therapy and the modalities   Patient with Progressive multiorgan failure with a very high probablity of a very minimal chance of meaningful recovery despite all aggressive and optimal medical therapy.   END STAGE CHF RENAL FAILURE DYING PROCESS  Family understands the situation.  They have consented and agreed to DNR/DNI and would like to proceed with Comfort care measures.  Family are satisfied with Plan of action and management. All questions answered  Additional CC time 35 mins   Gerald [name] Patricia Pesa, M.D.  [facility]  Medical Director [place] Director [facility] Cardio-Pulmonary Department

## 2022-02-13 NOTE — Progress Notes (Signed)
ANTICOAGULATION CONSULT NOTE   Pharmacy Consult for Heparin Infusion Indication: ACS/STEMI/atrial fibrillation  Patient Measurements: Height: 6' (182.9 cm) Weight: (!) 144.1 kg (317 lb 10.9 oz) IBW/kg (Calculated) : 77.6 Heparin Dosing Weight: 109.9 kg  Labs: Recent Labs    02/11/22 0330 02/11/22 0615 02/12/22 0503 02/12/22 0518 02/12/22 1421 02/12/22 2331 02/13/22 0452  HGB 8.1*  --  7.8*  --   --   --  8.1*  HCT 26.9*  --  23.9*  --   --   --  23.6*  PLT 221  --  264  --   --   --  317  HEPARINUNFRC  --    < > 0.22*  --  0.34 0.47 0.30  CREATININE 6.36*  --   --  6.98*  --   --   --    < > = values in this interval not displayed.     Estimated Creatinine Clearance: 20.5 mL/min (A) (by C-G formula based on SCr of 6.98 mg/dL (H)).   Medical History: Past Medical History:  Diagnosis Date   Acute on chronic combined systolic and diastolic CHF (congestive heart failure) ([name])    Acute on chronic systolic CHF (congestive heart failure) ([place]) 00/37/0488   Acute systolic congestive heart failure ([name])    Atrial flutter, paroxysmal ([name])    CHF (congestive heart failure) ([place]) 03/30/2021   Chronic combined systolic and diastolic CHF (congestive heart failure) ([name])    Chronic kidney disease, stage III (moderate) ([name])    Dysrhythmia    brief episode afib, cardioverted did not return   Hypertension    Hypertensive crisis 01/20/2021   Hypertensive emergency 12/21/2020   NSTEMI (non-ST elevated myocardial infarction) ([place]) 12/21/2015   Polysubstance abuse ([place])     Medications:  PTA meds include Xarelto 20 mg daily.  Last dose unknown.    Assessment: 41 y/o male with h/o Afib, CKD, HTN, substance abuse, CAD, NSTEMI and CHF who is admitted with PNA, sepsis, CHF and PEA arrest. Patient with bleeding from femoral site following removal of trialysis catheter ~heparin drip held and PAD device in place ~bleeding currently appears to have subsided and heparin has now been  restarted  Goal of Therapy:  Heparin level 0.3-0.7 units/ml Monitor platelets by anticoagulation protocol: Yes  Plan:  Heparin level therapeutic almost subtherapeutic, trending down Increase heparin infusion to 2650 units/hr Recheck HL in 8 hrs after rate increase Daily CBC while on IV heparin  Renda Rolls, PharmD, [facility] 02/13/2022 5:57 AM

## 2022-02-13 NOTE — Progress Notes (Signed)
Called CDS [phone] holding for call back.

## 2022-02-14 ENCOUNTER — Encounter: Payer: Self-pay | Admitting: Certified Registered Nurse Anesthetist

## 2022-02-14 ENCOUNTER — Encounter: Admission: EM | Disposition: E | Payer: Self-pay | Source: Home / Self Care | Attending: Internal Medicine

## 2022-02-14 DIAGNOSIS — R7401 Elevation of levels of liver transaminase levels: Secondary | ICD-10-CM

## 2022-02-14 DIAGNOSIS — Z515 Encounter for palliative care: Secondary | ICD-10-CM

## 2022-02-14 DIAGNOSIS — I469 Cardiac arrest, cause unspecified: Secondary | ICD-10-CM

## 2022-02-14 DIAGNOSIS — I428 Other cardiomyopathies: Secondary | ICD-10-CM

## 2022-02-14 HISTORY — PX: ORGAN PROCUREMENT: SHX5270

## 2022-02-14 LAB — COMPREHENSIVE METABOLIC PANEL
ALT: 67 U/L — ABNORMAL HIGH (ref 0–44)
ALT: 61 U/L — ABNORMAL HIGH (ref 0–44)
AST: 24 U/L (ref 15–41)
AST: 19 U/L (ref 15–41)
Albumin: 2.6 g/dL — ABNORMAL LOW (ref 3.5–5.0)
Albumin: 2.6 g/dL — ABNORMAL LOW (ref 3.5–5.0)
Alkaline Phosphatase: 85 U/L (ref 38–126)
Alkaline Phosphatase: 80 U/L (ref 38–126)
Anion gap: 18 — ABNORMAL HIGH (ref 5–15)
Anion gap: 19 — ABNORMAL HIGH (ref 5–15)
BUN: 103 mg/dL — ABNORMAL HIGH (ref 6–20)
BUN: 113 mg/dL — ABNORMAL HIGH (ref 6–20)
CO2: 24 mmol/L (ref 22–32)
CO2: 22 mmol/L (ref 22–32)
Calcium: 9.9 mg/dL (ref 8.9–10.3)
Calcium: 9.3 mg/dL (ref 8.9–10.3)
Chloride: 96 mmol/L — ABNORMAL LOW (ref 98–111)
Chloride: 93 mmol/L — ABNORMAL LOW (ref 98–111)
Creatinine, Ser: 7.82 mg/dL — ABNORMAL HIGH (ref 0.61–1.24)
Creatinine, Ser: 8.73 mg/dL — ABNORMAL HIGH (ref 0.61–1.24)
GFR, Estimated: 8 mL/min — ABNORMAL LOW (ref >60)
GFR, Estimated: 7 mL/min — ABNORMAL LOW (ref >60)
Glucose, Bld: 95 mg/dL (ref 70–99)
Glucose, Bld: 94 mg/dL (ref 70–99)
Potassium: 5.6 mmol/L — ABNORMAL HIGH (ref 3.5–5.1)
Potassium: 5.9 mmol/L — ABNORMAL HIGH (ref 3.5–5.1)
Sodium: 138 mmol/L (ref 135–145)
Sodium: 134 mmol/L — ABNORMAL LOW (ref 135–145)
Total Bilirubin: 0.8 mg/dL (ref 0.3–1.2)
Total Bilirubin: 1 mg/dL (ref 0.3–1.2)
Total Protein: 7.5 g/dL (ref 6.5–8.1)
Total Protein: 7.5 g/dL (ref 6.5–8.1)

## 2022-02-14 LAB — PHOSPHORUS: Phosphorus: >30 mg/dL — ABNORMAL HIGH (ref 2.5–4.6)

## 2022-02-14 LAB — CBC WITH DIFFERENTIAL/PLATELET
Abs Immature Granulocytes: 0.05 10*3/uL (ref 0.00–0.07)
Abs Immature Granulocytes: 0.03 10*3/uL (ref 0.00–0.07)
Basophils Absolute: 0.1 10*3/uL (ref 0.0–0.1)
Basophils Absolute: 0.1 10*3/uL (ref 0.0–0.1)
Basophils Relative: 1 %
Basophils Relative: 1 %
Eosinophils Absolute: 0.2 10*3/uL (ref 0.0–0.5)
Eosinophils Absolute: 0.3 10*3/uL (ref 0.0–0.5)
Eosinophils Relative: 3 %
Eosinophils Relative: 4 %
HCT: 25.1 % — ABNORMAL LOW (ref 39.0–52.0)
HCT: 23.8 % — ABNORMAL LOW (ref 39.0–52.0)
Hemoglobin: 7.7 g/dL — ABNORMAL LOW (ref 13.0–17.0)
Hemoglobin: 7.4 g/dL — ABNORMAL LOW (ref 13.0–17.0)
Immature Granulocytes: 1 %
Immature Granulocytes: 0 %
Lymphocytes Relative: 11 %
Lymphocytes Relative: 10 %
Lymphs Abs: 0.8 10*3/uL (ref 0.7–4.0)
Lymphs Abs: 0.7 10*3/uL (ref 0.7–4.0)
MCH: 20.5 pg — ABNORMAL LOW (ref 26.0–34.0)
MCH: 20.7 pg — ABNORMAL LOW (ref 26.0–34.0)
MCHC: 30.7 g/dL (ref 30.0–36.0)
MCHC: 31.1 g/dL (ref 30.0–36.0)
MCV: 66.9 fL — ABNORMAL LOW (ref 80.0–100.0)
MCV: 66.7 fL — ABNORMAL LOW (ref 80.0–100.0)
Monocytes Absolute: 0.9 10*3/uL (ref 0.1–1.0)
Monocytes Absolute: 0.8 10*3/uL (ref 0.1–1.0)
Monocytes Relative: 12 %
Monocytes Relative: 12 %
Neutro Abs: 5.4 10*3/uL (ref 1.7–7.7)
Neutro Abs: 5.1 10*3/uL (ref 1.7–7.7)
Neutrophils Relative %: 72 %
Neutrophils Relative %: 73 %
Platelets: 378 10*3/uL (ref 150–400)
Platelets: 316 10*3/uL (ref 150–400)
RBC: 3.75 MIL/uL — ABNORMAL LOW (ref 4.22–5.81)
RBC: 3.57 MIL/uL — ABNORMAL LOW (ref 4.22–5.81)
RDW: 17.7 % — ABNORMAL HIGH (ref 11.5–15.5)
RDW: 17.6 % — ABNORMAL HIGH (ref 11.5–15.5)
WBC: 7.5 10*3/uL (ref 4.0–10.5)
WBC: 7 10*3/uL (ref 4.0–10.5)
nRBC: 0 % (ref 0.0–0.2)
nRBC: 0 % (ref 0.0–0.2)

## 2022-02-14 LAB — URINALYSIS, COMPLETE (UACMP) WITH MICROSCOPIC
Bilirubin Urine: NEGATIVE
Glucose, UA: NEGATIVE mg/dL
Ketones, ur: NEGATIVE mg/dL
Nitrite: NEGATIVE
Protein, ur: 100 mg/dL — AB
RBC / HPF: >50 RBC/hpf — ABNORMAL HIGH (ref 0–5)
Specific Gravity, Urine: 1.015 (ref 1.005–1.030)
pH: 5 (ref 5.0–8.0)

## 2022-02-14 LAB — APTT
aPTT: 34 seconds (ref 24–36)
aPTT: 35 seconds (ref 24–36)

## 2022-02-14 LAB — PROTIME-INR
INR: 1.2 (ref 0.8–1.2)
INR: 1.2 (ref 0.8–1.2)
Prothrombin Time: 14.8 seconds (ref 11.4–15.2)
Prothrombin Time: 15.3 seconds — ABNORMAL HIGH (ref 11.4–15.2)

## 2022-02-14 LAB — MAGNESIUM: Magnesium: 3 mg/dL — ABNORMAL HIGH (ref 1.7–2.4)

## 2022-02-14 LAB — PREPARE RBC (CROSSMATCH)

## 2022-02-14 LAB — BILIRUBIN, DIRECT
Bilirubin, Direct: 0.2 mg/dL (ref 0.0–0.2)
Bilirubin, Direct: 0.2 mg/dL (ref 0.0–0.2)

## 2022-02-14 LAB — GLUCOSE, CAPILLARY
Glucose-Capillary: 100 mg/dL — ABNORMAL HIGH (ref 70–99)
Glucose-Capillary: 107 mg/dL — ABNORMAL HIGH (ref 70–99)
Glucose-Capillary: 92 mg/dL (ref 70–99)
Glucose-Capillary: 97 mg/dL (ref 70–99)
Glucose-Capillary: 98 mg/dL (ref 70–99)
Glucose-Capillary: 99 mg/dL (ref 70–99)

## 2022-02-14 SURGERY — CREATION, TRACHEOSTOMY
Anesthesia: General

## 2022-02-14 SURGERY — SURGICAL PROCUREMENT, ORGAN
Anesthesia: Choice | Site: Abdomen

## 2022-02-14 MED ORDER — HEPARIN SODIUM 10000 UNIT/ML FOR ORGAN DONATION
300.0000 [IU]/kg | INTRAMUSCULAR | Status: AC
  Administered 2022-02-14: 42500 [IU] via INTRAVENOUS
  Filled 2022-02-14: qty 4.25

## 2022-02-14 MED ORDER — SODIUM CHLORIDE 0.9% IV SOLUTION: Freq: Once | INTRAVENOUS | Status: DC

## 2022-02-14 MED ORDER — FENTANYL CITRATE PF 50 MCG/ML IJ SOSY: 25.0000 ug | PREFILLED_SYRINGE | INTRAMUSCULAR | Status: DC | PRN

## 2022-02-14 MED ORDER — LORAZEPAM 2 MG/ML IJ SOLN
2.0000 mg | INTRAMUSCULAR | Status: DC | PRN
  Administered 2022-02-14 (×2): 4 mg via INTRAVENOUS
  Filled 2022-02-14 (×2): qty 2

## 2022-02-14 SURGICAL SUPPLY — 94 items
APL PRP STRL LF DISP 70% ISPRP (MISCELLANEOUS) ×1
APPLIER CLIP 11 MED OPEN (CLIP) ×4
APPLIER CLIP 13 LRG OPEN (CLIP) ×4
APR CLP LRG 13 20 CLIP (CLIP) ×2
APR CLP MED 11 20 MLT OPN (CLIP) ×2
BLADE CLIPPER SURG (BLADE) ×1 IMPLANT
BLADE STERNUM SYSTEM 6 (BLADE) ×2 IMPLANT
BLADE SURG SZ10 CARB STEEL (BLADE) ×1 IMPLANT
CHLORAPREP W/TINT 26 (MISCELLANEOUS) ×3 IMPLANT
CLIP APPLIE 11 MED OPEN (CLIP) IMPLANT
CLIP APPLIE 13 LRG OPEN (CLIP) IMPLANT
CLIP TI MEDIUM 6 (CLIP) ×2 IMPLANT
CLIP TI WIDE RED SMALL 6 (CLIP) ×2 IMPLANT
CNTNR SPEC 2.5X3XGRAD LEK (MISCELLANEOUS) ×1
CONT SPEC 4OZ STER OR WHT (MISCELLANEOUS) ×2
CONT SPEC 4OZ STRL OR WHT (MISCELLANEOUS) ×1
CONTAINER SPEC 2.5X3XGRAD LEK (MISCELLANEOUS) ×1 IMPLANT
COVER BACK TABLE 60X90IN (DRAPES) ×2 IMPLANT
COVER LIGHT HANDLE STERIS (MISCELLANEOUS) ×1 IMPLANT
COVER MAYO STAND STRL (DRAPES) IMPLANT
DRAPE 3/4 80X56 (DRAPES) ×3 IMPLANT
DRAPE INCISE IOBAN 66X60 STRL (DRAPES) ×1 IMPLANT
DRAPE LAPAROTOMY 100X77 ABD (DRAPES) ×2 IMPLANT
DRSG OPSITE POSTOP 4X12 (GAUZE/BANDAGES/DRESSINGS) IMPLANT
DRSG OPSITE POSTOP 4X14 (GAUZE/BANDAGES/DRESSINGS) IMPLANT
DRSG OPSITE POSTOP 4X8 (GAUZE/BANDAGES/DRESSINGS) IMPLANT
DRSG TELFA 3X8 NADH (GAUZE/BANDAGES/DRESSINGS) ×4 IMPLANT
ELECT BLADE 6.5 EXT (BLADE) IMPLANT
ELECT REM PT RETURN 9FT ADLT (ELECTROSURGICAL)
ELECTRODE REM PT RTRN 9FT ADLT (ELECTROSURGICAL) IMPLANT
GAUZE 4X4 16PLY ~~LOC~~+RFID DBL (SPONGE) ×2 IMPLANT
GAUZE SPONGE 4X4 12PLY STRL (GAUZE/BANDAGES/DRESSINGS) IMPLANT
GLOVE BIO SURGEON STRL SZ 6.5 (GLOVE) ×1 IMPLANT
GLOVE BIO SURGEON STRL SZ7 (GLOVE) ×2 IMPLANT
GLOVE BIO SURGEON STRL SZ7.5 (GLOVE) ×6 IMPLANT
GLOVE BIOGEL PI IND STRL 7.5 (GLOVE) IMPLANT
GLOVE BIOGEL PI IND STRL 8 (GLOVE) IMPLANT
GLOVE BIOGEL PI INDICATOR 7.5 (GLOVE) ×3
GLOVE BIOGEL PI INDICATOR 8 (GLOVE) ×3
GOWN STRL REUS W/ TWL LRG LVL3 (GOWN DISPOSABLE) ×4 IMPLANT
GOWN STRL REUS W/TWL LRG LVL3 (GOWN DISPOSABLE) ×12
HANDLE SUCTION POOLE (INSTRUMENTS) ×2 IMPLANT
HANDLE YANKAUER SUCT BULB TIP (MISCELLANEOUS) ×5 IMPLANT
KIT POST MORTEM ADULT 36X90 (BAG) ×2 IMPLANT
KIT TURNOVER KIT A (KITS) ×2 IMPLANT
LOOP RED MAXI  1X406MM (MISCELLANEOUS) ×2
LOOP VESSEL MAXI 1X406 RED (MISCELLANEOUS) ×1 IMPLANT
LOOP VESSEL MINI 0.8X406 BLUE (MISCELLANEOUS) ×1 IMPLANT
LOOPS BLUE MINI 0.8X406MM (MISCELLANEOUS) ×2
MANIFOLD NEPTUNE II (INSTRUMENTS) ×6 IMPLANT
MAT ABSORB  FLUID 56X50 GRAY (MISCELLANEOUS) ×4
MAT ABSORB FLUID 56X50 GRAY (MISCELLANEOUS) ×2 IMPLANT
NDL BIOPSY 14X6 SOFT TISS (NEEDLE) IMPLANT
NEEDLE BIOPSY 14X6 SOFT TISS (NEEDLE) IMPLANT
PACK BASIN MAJOR ARMC (MISCELLANEOUS) ×2 IMPLANT
PACK UNIVERSAL (MISCELLANEOUS) ×2 IMPLANT
PAD DRESSING TELFA 3X8 NADH (GAUZE/BANDAGES/DRESSINGS) ×1 IMPLANT
PENCIL ELECTRO HAND CTR (MISCELLANEOUS) ×2 IMPLANT
SET YANKAUER POOLE SUCT (MISCELLANEOUS) ×1 IMPLANT
SPONGE KITTNER 5P (MISCELLANEOUS) ×1 IMPLANT
SPONGE T-LAP 18X18 ~~LOC~~+RFID (SPONGE) ×10 IMPLANT
STAPLER SKIN PROX 35W (STAPLE) ×2 IMPLANT
SUCTION POOLE HANDLE (INSTRUMENTS) ×6
SUT BONE WAX W31G (SUTURE) ×1 IMPLANT
SUT ETHIBOND #5 BRAIDED 30INL (SUTURE) IMPLANT
SUT ETHIBOND 2 V 37 (SUTURE) IMPLANT
SUT ETHIBOND NAB CT1 #1 30IN (SUTURE) ×3 IMPLANT
SUT ETHILON 2 0 FS 18 (SUTURE) ×3 IMPLANT
SUT ETHILON NAB BLK LR #2 30IN (SUTURE) ×4 IMPLANT
SUT PROLENE 4 0 RB 1 (SUTURE) ×2
SUT PROLENE 4 0 SH DA (SUTURE) ×7 IMPLANT
SUT PROLENE 4-0 RB1 .5 CRCL 36 (SUTURE) IMPLANT
SUT PROLENE 6 0 BV (SUTURE) IMPLANT
SUT SILK 0 (SUTURE) ×2
SUT SILK 0 30XBRD TIE 6 (SUTURE) ×1 IMPLANT
SUT SILK 1 SH (SUTURE) IMPLANT
SUT SILK 1 TIES 10/18 (SUTURE) ×2 IMPLANT
SUT SILK 2 0 (SUTURE) ×4
SUT SILK 2 0 SH (SUTURE) ×2 IMPLANT
SUT SILK 2 0 SH CR/8 (SUTURE) IMPLANT
SUT SILK 2-0 18XBRD TIE 12 (SUTURE) ×1 IMPLANT
SUT SILK 3 0 (SUTURE) ×4
SUT SILK 3-0 18XBRD TIE 12 (SUTURE) IMPLANT
SYR 10ML LL (SYRINGE) ×2 IMPLANT
SYR 20ML LL LF (SYRINGE) ×2 IMPLANT
SYR 30ML LL (SYRINGE) ×2 IMPLANT
SYR BULB IRRIG 60ML STRL (SYRINGE) ×1 IMPLANT
SYR TOOMEY 50ML (SYRINGE) IMPLANT
TAPE UMBILICAL 1/8 X36 TWILL (MISCELLANEOUS) ×4 IMPLANT
TOWEL OR 17X26 4PK STRL BLUE (TOWEL DISPOSABLE) ×4 IMPLANT
TOWEL ~~LOC~~+RFID 17X26 BLUE (SPONGE) ×2 IMPLANT
TUBING CONNECTING 10 (TUBING) ×6 IMPLANT
WATER STERILE IRR 1000ML POUR (IV SOLUTION) ×2 IMPLANT
WATER STERILE IRR 500ML POUR (IV SOLUTION) ×2 IMPLANT

## 2022-02-14 NOTE — Progress Notes (Signed)
NAME:  Gerald [name], MRN:  [id number], DOB:  [birth year], LOS: 12 ADMISSION DATE:  01/21/2022 CONSULTATION DATE:  01/31/2022  Brief patient description Gerald [name]:  41 yo morbidly obese AAM with acute sCHF exacerbation with acute hypoxic resp failure with pulm edema and renal failure leading to severe resp failure due to severe end stage nonischemic sCHF exacerbation leading to progressive cardiorenal syndrome with renal failure requiring CRRT  History of Present Illness:  41 y.o. male with history of chronic systolic and diastolic CHF last EF measured was 20 to 25% in January 2023 with history of hypertension, chronic kidney disease stage III baseline creatinine around 2,  anemia polysubstance abuse admits to taking cocaine presents to the ER because of worsening shortness of breath for the last 3 days with no associated chest pain has some productive cough.   Patient states he has not taken his medicines for last 3 days.  Admitted to [facility] with SOB   ED Course: In the ER patient was hypoxic initially requiring BiPAP but after placing on BiPAP patient had vomiting and had to be taken off the BiPAP was placed on high flow oxygen.  Patient chest x-ray shows congestion was placed on IV Lasix and also empirically on antibiotics.   BNP 790 high sensitive troponin was 553 and 4 and 72 was started on IV heparin.  Patient admitted for acute hypoxic respiratory failure likely from CHF.  Since patient was in A-fib with RVR initially was started on Cardizem infusion.  Transition to amiodarone.  PCCM consulted to assess worsening SOB and increased WOB  6/15  Significant Hospital Events: Including procedures, antibiotic start and stop dates in addition to other pertinent events   6/14 admitted for acute sCHF 6/15 PCCM consulted for impending resp failure Feb 13, 2023 patient coded for 1 round with the intubation tried from respiratory arrest 06/17 patient is anuric dialysis catheter is and nephrology is involved  he might need CRRT or HD 06/18 increase in liver enzymes to above 1000 we will hold amiodarone and atorvastatin 6/19 multiorgan failure, on CRRT  6/20: Failed SBT due to delirium and increased work of breathing, tachycardia, diaphoresis.  Transitioned from CRRT to intermittent HD (plan for first HD session 6/21) 6/21: Plan for HD this morning, then will attempt SBT.  Precedex as needed for agitation/delirium with wake up assessment 6/22: Pt remains mechanically intubated requiring minimal ventilator settings.  Sedated with precedex and fentanyl gtts agitation/delirium improving. Contacted nephrology requesting HD session today prior to SBT trial.  Low-grade fevers overnight blood and respiratory cultures pending  6/23: Fevers have slightly improved.  Pt had a period of agitation requiring 1x dose of 2 mg iv versed.  Will repeat WUA in an attempt to perform SBT  6/24: Persistent issues with agitation, increased issues with arrhythmia, beta-blockers increased.  Phenobarbital protocol for ICU delirium.  Thiamine 6/25: persistent delirium, switched to Propofol, will need tracheostomy to wean 6/26 extubated due mucus plugs in ETT, re-intubated without difficulty +UPPER AIRWAY SWELLING 6/27 remains intubated on vent 6/28 near Cardiac arrest after SAT SBT trial, mother with patient consented to DNR status  Micro Data:  6/15: SARS-CoV-2 and influenza PCR>> negative 6/15: Blood culture x2>> no growth 6/15: MRSA PCR>> negative 6/15: Sputum>> normal respiratory flora 6/15: Strep pneumo and Legionella urinary antigens>> negative 13-Feb-2023: Mycoplasma pneumonia a IgM>> less than 770 6/18: Hepatitis B>> nonreactive 6/22: Blood x2>>negative  6/22: Respiratory>>  Antimicrobials:   Antibiotics Given (last 72 hours)     None  Interim History / Subjective:  Severe hypoxia  Remains intubated  Multiorgan failure  Remains critically ill  New cardiac arrest yesterday  Patient is now DNR status   Lurline Idol has been canceled  Patient has multiorgan failure and in the dying process     Objective   Blood pressure (!) 89/49, pulse 75, temperature 98 F (36.7 C), resp. rate 20, height 6' (1.829 m), weight (!) 141.8 kg, SpO2 98 %.    Vent Mode: PRVC FiO2 (%):  [70 %] 70 % Set Rate:  [20 bmp] 20 bmp Vt Set:  [550 mL] 550 mL PEEP:  [10 cmH20] 10 cmH20 Plateau Pressure:  [20 cmH20-24 cmH20] 24 cmH20   Intake/Output Summary (Last 24 hours) at 02/03/2022 1741 Last data filed at 02/10/2022 1629 Gross per 24 hour  Intake 1667.34 ml  Output 1900 ml  Net -232.66 ml    Filed Weights   02/12/22 2302 02/13/22 0500 01/20/2022 0500  Weight: (!) 144.1 kg (!) 144.1 kg (!) 141.8 kg    REVIEW OF SYSTEMS  PATIENT IS UNABLE TO PROVIDE COMPLETE REVIEW OF SYSTEMS DUE TO SEVERE CRITICAL ILLNESS    PHYSICAL EXAMINATION:  GENERAL:critically ill appearing, +resp distress EYES: Pupils equal, round, reactive to light.  No scleral icterus.  MOUTH: Moist mucosal membrane. INTUBATED NECK: Supple.  PULMONARY: +rhonchi, +wheezing CARDIOVASCULAR: S1 and S2.  No murmurs  GASTROINTESTINAL: Soft, nontender, -distended. Positive bowel sounds.  MUSCULOSKELETAL: No swelling, clubbing, or edema.  NEUROLOGIC: obtunded SKIN:intact,warm,dry     Labs/imaging that I havepersonally reviewed  (right click and "Reselect all SmartList Selections" daily)  All labs reviewed 02/08/22  Labs   CBC: Recent Labs  Lab 02/12/22 0503 02/13/22 0452 02/13/22 1650 01/31/2022 0403 02/11/2022 1550  WBC 5.6 7.0 6.6 7.5 7.0  NEUTROABS 3.5 4.9 4.3 5.4 5.1  HGB 7.8* 8.1* 7.6* 7.7* 7.4*  HCT 23.9* 23.6* 24.6* 25.1* 23.8*  MCV 66.0* 66.7* 66.3* 66.9* 66.7*  PLT 264 317 322 378 316     Basic Metabolic Panel: Recent Labs  Lab 02/10/22 0329 02/11/22 0330 02/12/22 0503 02/12/22 0518 02/13/22 0452 02/13/22 1650 01/22/2022 0403 01/25/2022 1550  NA 135 135  --  134* 131* 134* 138 134*  K 4.7 4.8  --  4.3 4.5 5.0 5.6* 5.9*   CL 98 96*  --  95* 92* 92* 96* 93*  CO2 19* 23  --  23 25 25 24 22   GLUCOSE 98 107*  --  91 89 103* 95 94  BUN 136* 98*  --  118* 88* 94* 103* 113*  CREATININE 8.75* 6.36*  --  6.98* 6.46* 7.64* 7.82* 8.73*  CALCIUM 9.1 8.9  --  8.9 8.6* 9.5 9.9 9.3  MG 2.8* 2.8* 2.8*  --  2.7*  --  3.0*  --   PHOS >30.0* 9.3*  --  9.4* 9.8*  --  >30.0*  --     GFR: Estimated Creatinine Clearance: 16.3 mL/min (A) (by C-G formula based on SCr of 8.73 mg/dL (H)). Recent Labs  Lab 02/10/22 0329 02/11/22 0330 02/12/22 0503 02/13/22 0452 02/13/22 1650 02/06/2022 0403 01/31/2022 1550  PROCALCITON 1.71 1.20  --   --   --   --   --   WBC 7.9 7.0   < > 7.0 6.6 7.5 7.0   < > = values in this interval not displayed.     Liver Function Tests: Recent Labs  Lab 02/10/22 0329 02/11/22 0330 02/12/22 0518 02/13/22 0452 02/13/22 1650 01/29/2022 0403 02/03/2022 1550  AST 39  --   --   --  20 24 19   ALT 164*  --   --   --  72* 67* 61*  ALKPHOS 88  --   --   --  81 85 80  BILITOT 0.6  --   --   --  0.7 0.8 1.0  PROT 6.9  --   --   --  7.4 7.5 7.5  ALBUMIN 2.4*  2.5*   < > 2.6* 2.6* 2.7* 2.6* 2.6*   < > = values in this interval not displayed.    No results for input(s): "LIPASE", "AMYLASE" in the last 168 hours. No results for input(s): "AMMONIA" in the last 168 hours.   ABG    Component Value Date/Time   PHART 7.33 (L) 01/26/2022 1542   PCO2ART 45 01/20/2022 1542   PO2ART 192 (H) 02/03/2022 1542   HCO3 23.7 02/08/2022 1542   ACIDBASEDEF 2.1 (H) 01/25/2022 1542   O2SAT 99.6 01/29/2022 1542   HbA1C: Hgb A1c MFr Bld  Date/Time Value Ref Range Status  09/05/2021 05:00 PM 6.1 (H) 4.8 - 5.6 % Final    Comment:    (NOTE)         Prediabetes: 5.7 - 6.4         Diabetes: >6.4         Glycemic control for adults with diabetes: <7.0   03/30/2021 08:25 PM 6.2 (H) 4.8 - 5.6 % Final    Comment:    (NOTE) Pre diabetes:          5.7%-6.4%  Diabetes:              >6.4%  Glycemic control for    <7.0% adults with diabetes     CBG: Recent Labs  Lab 01/27/2022 0008 01/25/2022 0350 02/08/2022 0756 01/22/2022 1139 01/23/2022 1553  GLUCAP 107* 97 92 98 100*    Scheduled Meds:  sodium chloride   Intravenous Once   Chlorhexidine Gluconate Cloth  6 each Topical Q0600   docusate  100 mg Per Tube BID   feeding supplement (PROSource TF)  90 mL Per Tube BID   free water  100 mL Per Tube Q4H   insulin aspart  0-6 Units Subcutaneous Q4H   metoprolol tartrate  25 mg Oral Q6H   multivitamin  1 tablet Per Tube QHS   mouth rinse  15 mL Mouth Rinse Q2H   pantoprazole sodium  40 mg Per Tube Daily   PHENObarbital  60 mg Per Tube TID   polyethylene glycol  17 g Per Tube Daily   sodium chloride flush  10-40 mL Intracatheter Q12H   thiamine injection  100 mg Intravenous Daily   Continuous Infusions:  sodium chloride Stopped (02/02/22 2010)   sodium chloride     feeding supplement (VITAL HIGH PROTEIN) Stopped (02/11/22 1239)   fentaNYL infusion INTRAVENOUS 175 mcg/hr (02/09/2022 1522)   morphine     norepinephrine (LEVOPHED) Adult infusion Stopped (02/13/22 1954)   propofol (DIPRIVAN) infusion 50 mcg/kg/min (01/21/2022 1629)   PRN Meds:.sodium chloride, acetaminophen **OR** acetaminophen, bisacodyl, fentaNYL (SUBLIMAZE) injection, glycopyrrolate **OR** glycopyrrolate **OR** glycopyrrolate, heparin, heparin, lidocaine (PF), lidocaine-prilocaine, LORazepam, midazolam, morphine, mouth rinse, pentafluoroprop-tetrafluoroeth, polyvinyl alcohol, sodium chloride flush, vecuronium      ASSESSMENT AND PLAN 41 yo morbidly obese AAM with end stage nonischemic cardiomyopathy with acute systolic heart failure leading to progressive cardiorenal syndrome failure to wean from vent with near cardiac arrest event with significant cardiac failure and failure to  wean from the ventilator   Severe ACUTE Hypoxic and Hypercapnic Respiratory Failure -continue Mechanical Ventilator support -Wean Fio2 and PEEP as  tolerated -VAP/VENT bundle implementation - Wean PEEP & FiO2 as tolerated, maintain SpO2 > 88% - Head of bed elevated 30 degrees, VAP protocol in place - Plateau pressures less than 30 cm H20  - Intermittent chest x-ray & ABG PRN - Ensure adequate pulmonary hygiene  -will NOT perform SAT/SBT  Patient with near cardiac arrest check has been canceled Recommend multiple measures  Severe acute cardiac failure EF 25% with A-fib RVR Follow-up cardiology recommendations Uremia and AGMA likely aggravating AF variability   Continue anticoagulation Vent support  ACUTE KIDNEY INJURY/Renal Failure -continue Foley Catheter-assess need -Avoid nephrotoxic agents -Follow urine output, BMP -Ensure adequate renal perfusion, optimize oxygenation -Renal dose medications -Severe uremia, not candidate for HD   Intake/Output Summary (Last 24 hours) at 02/08/2022 1741 Last data filed at 01/23/2022 1629 Gross per 24 hour  Intake 1667.34 ml  Output 1900 ml  Net -232.66 ml    HEMOPTYSIS AND BLEEDING FROM LUNGS WILL NEED TO STOP HEPARIN INFUSION   ENDO - ICU hypoglycemic\Hyperglycemia protocol -check FSBS per protocol   GI GI PROPHYLAXIS as indicated  NUTRITIONAL STATUS DIET-->TF's as tolerated Constipation protocol as indicated   ELECTROLYTES -follow labs as needed -replace as needed -pharmacy consultation and following    NEUROLOGY ACUTE  METABOLIC ENCEPHALOPATHY -need for sedation -Goal RASS -2 to -3 PMHx: Polysubstance abuse UDS negative  -Avoid sedating medications as able -Discontinue trazodone, Klonopin, hydrocodone -not effective -On phenobarb twice daily -Thiamine supplementation -Daily wake up assessment    Best practice (right click and "Reselect all SmartList Selections" daily)  Diet: Tube feeds DVT prophylaxis:STOPPED DUE TO BLEEDING FROM LUNGS Foley: Yes and still needed  Mobility:  bed rest  Code Status:  DNR Disposition: ICU, plan to go to operative  suite for extubation and donor team      DVT/GI PRX  assessed I Assessed the need for Labs I Assessed the need for Foley I Assessed the need for Central Venous Line Family Discussion when available I Assessed the need for Mobilization I made an Assessment of medications to be adjusted accordingly Safety Risk assessment completed  CASE DISCUSSED IN MULTIDISCIPLINARY ROUNDS WITH ICU TEAM     Critical Care Time devoted to patient care services described in this note is 40 minutes.  Critical care was necessary to treat /prevent imminent and life-threatening deterioration. Overall, patient is critically ill, prognosis is guarded.  Patient with Multiorgan failure and at high risk for cardiac arrest and death.

## 2022-02-14 NOTE — Procedures (Signed)
Arterial Catheter Insertion Procedure Note  RONAN DUECKER  [id number]  1981-08-07  Date:01/28/2022  Time:5:48 PM    Provider Performing: Nelle Don    Procedure: Insertion of Arterial Line [phone]) without US guidance  Indication(s) Blood pressure monitoring and/or need for frequent ABGs  Consent Unable to obtain consent due to emergent nature of procedure.  Anesthesia None   Time Out Verified patient identification, verified procedure, site/side was marked, verified correct patient position, special equipment/implants available, medications/allergies/relevant history reviewed, required imaging and test results available.   Sterile Technique Maximal sterile technique including full sterile barrier drape, hand hygiene, sterile gown, sterile gloves, mask, hair covering, sterile ultrasound probe cover (if used).   Procedure Description Area of catheter insertion was cleaned with chlorhexidine and draped in sterile fashion. Without real-time ultrasound guidance an arterial catheter was placed into the right radial artery.  Appropriate arterial tracings confirmed on monitor.     Complications/Tolerance None; patient tolerated the procedure well.   EBL Minimal   Specimen(s) None

## 2022-02-14 NOTE — Progress Notes (Signed)
Per [place] [facility] we can get urine specimen with current Foley. Continue to assess.

## 2022-02-14 NOTE — Progress Notes (Signed)
Palliative Care Progress Note, Assessment & Plan   Patient Name: Gerald [name]       Date: 02/03/2022 DOB: 16-Feb-1981  Age: 41 y.o. MRN#: [id number] Attending Physician: Flora Lipps, MD Primary Care Physician: Patient, [name] Admit Date: 01/19/2022  Reason for Consultation/Follow-up: Establishing goals of care  Subjective: Patient is lying in bed.  He is intubated and sedated.  No family at bedside presently.  HPI: 41 yo morbidly obese AAM with acute sCHF exacerbation with acute hypoxic resp failure with pulm edema and renal failure leading to severe resp failure due to severe end stage nonischemic sCHF exacerbation leading to progressive cardiorenal syndrome with renal failure requiring CRRT. Decision was made by family to proceed with organ donation and one way extubation - planned today with coordination of [facility] team.   Summary of counseling/coordination of care: I received a call from patient's dayshift RN Katharine Look with concerns of comfort care orders and medications.    After reviewing the chart and assessing the patient at bedside, I met with patient's nurse Katharine Look and [facility] liaison and nurses.  We discussed that priority is a compassionate and dignified death for the patient. We also reviewed family is in agreement with one way extubation today.  Reviewed MAR with nurse.  Adjusted fentanyl and Ativan to be given more frequently if needed.  Discussed use of morphine drip for end-of-life management of breathlessness and pain.  Nurse confirmed she has what she believes she needs to be able to keep the patient comfortable.  Advised nurse to reach out to critical care team after 5pm today since palliative medicine team does not have in person coverage at [facility] after 5:00  today.  Code Status: DNR  Prognosis: Hours - Days  Discharge Planning: Anticipated Hospital Death  Care plan was discussed with RN Katharine Look, [facility] RN, [place]  Physical Exam Vitals reviewed.  Constitutional:      Appearance: He is ill-appearing.  HENT:     Head: Normocephalic.  Pulmonary:     Comments: MV Abdominal:     Palpations: Abdomen is soft.  Musculoskeletal:     Comments: sedated  Skin:    General: Skin is warm and dry.             Palliative Assessment/Data: 10%    Total Time 50 minutes  Greater than 50%  of this time was spent counseling and coordinating care related to the above assessment and plan.  Thank you for allowing the Palliative Medicine Team to assist in the care of this patient.  [place] Ilsa Iha, FNP-BC Palliative Medicine Team Team Phone # [phone]

## 2022-02-14 NOTE — Plan of Care (Signed)
Patient transported to OR at 2110 hrs for DCD. TOD at 2237 hrs.   Problem: Education: Goal: Knowledge of General Education information will improve Description: Including pain rating scale, medication(s)/side effects and non-pharmacologic comfort measures Outcome: Completed/Met   Problem: Health Behavior/Discharge Planning: Goal: Ability to manage health-related needs will improve Outcome: Completed/Met   Problem: Clinical Measurements: Goal: Ability to maintain clinical measurements within normal limits will improve Outcome: Completed/Met Goal: Will remain free from infection Outcome: Completed/Met Goal: Diagnostic test results will improve Outcome: Completed/Met Goal: Respiratory complications will improve Outcome: Completed/Met Goal: Cardiovascular complication will be avoided Outcome: Completed/Met   Problem: Activity: Goal: Risk for activity intolerance will decrease Outcome: Completed/Met   Problem: Nutrition: Goal: Adequate nutrition will be maintained Outcome: Completed/Met   Problem: Coping: Goal: Level of anxiety will decrease Outcome: Completed/Met   Problem: Elimination: Goal: Will not experience complications related to bowel motility Outcome: Completed/Met Goal: Will not experience complications related to urinary retention Outcome: Completed/Met   Problem: Pain Managment: Goal: General experience of comfort will improve Outcome: Completed/Met   Problem: Safety: Goal: Ability to remain free from injury will improve Outcome: Completed/Met   Problem: Skin Integrity: Goal: Risk for impaired skin integrity will decrease Outcome: Completed/Met   Problem: Education: Goal: Ability to demonstrate management of disease process will improve Outcome: Completed/Met Goal: Ability to verbalize understanding of medication therapies will improve Outcome: Completed/Met   Problem: Activity: Goal: Capacity to carry out activities will improve Outcome:  Completed/Met   Problem: Cardiac: Goal: Ability to achieve and maintain adequate cardiopulmonary perfusion will improve Outcome: Completed/Met   Problem: Education: Goal: Ability to describe self-care measures that may prevent or decrease complications (Diabetes Survival Skills Education) will improve Outcome: Completed/Met   Problem: Coping: Goal: Ability to adjust to condition or change in health will improve Outcome: Completed/Met   Problem: Fluid Volume: Goal: Ability to maintain a balanced intake and output will improve Outcome: Completed/Met   Problem: Health Behavior/Discharge Planning: Goal: Ability to identify and utilize available resources and services will improve Outcome: Completed/Met Goal: Ability to manage health-related needs will improve Outcome: Completed/Met   Problem: Metabolic: Goal: Ability to maintain appropriate glucose levels will improve Outcome: Completed/Met   Problem: Nutritional: Goal: Maintenance of adequate nutrition will improve Outcome: Completed/Met Goal: Progress toward achieving an optimal weight will improve Outcome: Completed/Met   Problem: Skin Integrity: Goal: Risk for impaired skin integrity will decrease Outcome: Completed/Met   Problem: Tissue Perfusion: Goal: Adequacy of tissue perfusion will improve Outcome: Completed/Met   Problem: Education: Goal: Knowledge of disease and its progression will improve Outcome: Completed/Met   Problem: Fluid Volume: Goal: Compliance with measures to maintain balanced fluid volume will improve Outcome: Completed/Met   Problem: Health Behavior/Discharge Planning: Goal: Ability to manage health-related needs will improve Outcome: Completed/Met   Problem: Nutritional: Goal: Ability to make healthy dietary choices will improve Outcome: Completed/Met   Problem: Clinical Measurements: Goal: Complications related to the disease process, condition or treatment will be avoided or  minimized Outcome: Completed/Met

## 2022-02-14 NOTE — Progress Notes (Addendum)
Patient transported off unit for DCD at 11-25-2108 hours by this RN; Jarrett Soho, RT; Vaughan Sine, RN; [facility] staff, and the patient's family. The patient was transported to OR 8 without incident. Patient was extubated per order at November 26, 2210 hrs. Time of death was called (per order) by this RN and Vaughan Sine, RN at Nov 26, 2235 hours. Patient was left in the care of OR staff after TOD called.

## 2022-02-15 LAB — TYPE AND SCREEN
ABO/RH(D): O POS
Antibody Screen: NEGATIVE
Unit division: 0
Unit division: 0
Unit division: 0
Unit division: 0
Unit division: 0

## 2022-02-15 LAB — BPAM RBC
Blood Product Expiration Date: 202308012359
Blood Product Expiration Date: 202308012359
Blood Product Expiration Date: 202308012359
Blood Product Expiration Date: 202308032359
Blood Product Expiration Date: 202308032359
ISSUE DATE / TIME: 202306302038
ISSUE DATE / TIME: 202306302133
ISSUE DATE / TIME: 202306302133
ISSUE DATE / TIME: 202306302133
ISSUE DATE / TIME: 202306302133
Unit Type and Rh: 5100
Unit Type and Rh: 5100
Unit Type and Rh: 5100
Unit Type and Rh: 5100
Unit Type and Rh: 5100

## 2022-02-15 LAB — URINE CULTURE: Culture: NO GROWTH

## 2022-02-15 MED ORDER — 0.9 % SODIUM CHLORIDE (POUR BTL) OPTIME
TOPICAL | Status: DC | PRN
  Administered 2022-02-15: 500 mL

## 2022-02-15 NOTE — Death Summary Note (Incomplete)
DEATH SUMMARY   Patient Details  Name: Gerald [name] MRN: [id number] DOB: 1980-12-11  Admission/Discharge Information   Admit Date:  04-Feb-2022  Date of Death:  19-Feb-2022  Time of Death:  22:37  Length of Stay: 74  Referring Physician: Patient, [name]   Reason(s) for Hospitalization  Progressive acute hypoxic respiratory failure  secondary to suspected acute on chronic HFrEF exacerbation and Atrial Fibrillation with RVR.  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 Secondary Diagnoses (including complications and co-morbidities):  Active Problems:   Community acquired pneumonia of right lung   Acute on chronic combined systolic and diastolic CHF (congestive heart failure) ([name])   Stage 3a chronic kidney disease (CKD) ([name]) - baseline SCr 1.8-2.0   Cocaine abuse ([name])   Obesity, Class III, BMI 40-49.9 (morbid obesity) ([place])   Acute on chronic congestive heart failure ([name])   Atrial fibrillation with RVR ([name])   Acute respiratory failure with hypoxemia ([name])   Encounter for continuous renal replacement therapy (CRRT) for acute renal failure ([name])   ESRD (end stage renal disease) on dialysis ([name])   NICM (nonischemic cardiomyopathy) [facility])   Brief Hospital Course (including significant findings, care, treatment, and services provided and events leading to death)  Gerald [name] is a 41 y.o. year old male who presented to [facility] ED on 02/04/2022 for evaluation of worsening shortness of breath over the last 3 days and productive cough with no associated chest pain.  Patient also reported on arrival he had not taken his medicines for the last 3 days.  In the ED patient was hypoxic initially requiring BiPAP, however unable to tolerate due to vomiting and was placed on high flow oxygen.  Chest x-ray on arrival concerning for vascular congestion, he received IV Lasix and empiric antibiotics.  BNP and troponin both elevated on admission  and IV heparin was started.  Patient also in atrial fibrillation with rapid ventricular response and was started on a Cardizem drip which was then transition to amiodarone infusion.  Work-up started for acute respiratory failure secondary to acute on chronic combined systolic and diastolic heart failure (most recent LVEF 20 to 25% with grade 3 diastolic dysfunction), A-fib RVR, CKD stage IIIa, hypertension and polysubstance abuse.    Pertinent Labs and Studies  Significant Diagnostic Studies US THYROID  Result Date: 02/19/22 CLINICAL DATA:  Incidental on CT.  Organ donation evaluation. EXAM: THYROID ULTRASOUND TECHNIQUE: Ultrasound examination of the thyroid gland and adjacent soft tissues was performed. COMPARISON:  CT CAP, 02/13/2022 FINDINGS: Parenchymal Echotexture: Moderately heterogenous Isthmus: 0.6 cm Right lobe: 7.0 x 2.0 x 2.0 cm Left lobe: 6.0 x 3.1 x 3.0 cm _________________________________________________________ Estimated total number of nodules >/= 1 cm: 3 Number of spongiform nodules >/=  2 cm not described below (TR1): 0 Number of mixed cystic and solid nodules >/= 1.5 cm not described below (TR2): 0 _________________________________________________________ Nodule # 1: Location: RIGHT; Inferior Maximum size: 2.4 cm; Other 2 dimensions: 1.8 x 2.0 cm Composition: solid/almost completely solid (2) Echogenicity: hyperechoic (1) Shape: not taller-than-wide (0) Margins: ill-defined (0) Echogenic foci: none (0) ACR TI-RADS total points: 3. ACR TI-RADS risk category: TR3 (3 points). ACR TI-RADS recommendations: *Given size (>/= 1.5 - 2.4 cm) and appearance, a follow-up ultrasound in 1 year should be considered based on TI-RADS criteria. _________________________________________________________ Nodule # 4: Location: ISTHMUS; Maximum size: 2.0 cm; Other 2 dimensions: 1.6 x 1.3 cm Composition: solid/almost completely solid (2)  Echogenicity: isoechoic (1) Shape: not taller-than-wide (0) Margins:  ill-defined (0) Echogenic foci: none (0) ACR TI-RADS total points: 3. ACR TI-RADS risk category: TR3 (3 points). ACR TI-RADS recommendations: *Given size (>/= 1.5 - 2.4 cm) and appearance, a follow-up ultrasound in 1 year should be considered based on TI-RADS criteria. _________________________________________________________ Nodule # 3: Location: LEFT; Inferior Maximum size: 3.7 cm; Other 2 dimensions: 2.6 x 3.3 cm Composition: solid/almost completely solid (2) Echogenicity: hyperechoic (1) Shape: not taller-than-wide (0) Margins: ill-defined (0) Echogenic foci: none (0) ACR TI-RADS total points: 3. ACR TI-RADS risk category: TR3 (3 points). ACR TI-RADS recommendations: **Given size (>/= 2.5 cm) and appearance, fine needle aspiration of this mildly suspicious nodule should be considered based on TI-RADS criteria. _________________________________________________________ Additional subcentimeter nodules labeled #2 and #3 do not meet threshold for follow-up nor biopsy per current criteria. No cervical adenopathy or abnormal fluid collection within the imaged neck IMPRESSION: 1. Enlarged, multinodular thyroid. 2. 3.7 cm LEFT inferior TR-3 thyroid nodule. FNA biopsy of this mildly suspicious nodule should be considered based on TI-RADS criteria. 3. 2.4 cm RIGHT inferior and 2.0 cm isthmus TR-3 thyroid nodules. In normal circumstance, follow-up ultrasound in 1 year would be considered based on TI-RADS criteria. The above is in keeping with the ACR TI-RADS recommendations - J Am Coll Radiol 2017;14:587-595. Electronically Signed   By: Michaelle Birks M.D.   On: 01/23/2022 05:57   CT CHEST ABDOMEN PELVIS WO CONTRAST  Result Date: 02/13/2022 CLINICAL DATA:  organ procurement.  Acute respiratory failure EXAM: CT CHEST, ABDOMEN AND PELVIS WITHOUT CONTRAST TECHNIQUE: Multidetector CT imaging of the chest, abdomen and pelvis was performed following the standard protocol without IV contrast. RADIATION DOSE REDUCTION: This exam  was performed according to the departmental dose-optimization program which includes automated exposure control, adjustment of the mA and/or kV according to patient size and/or use of iterative reconstruction technique. COMPARISON:  07/07/2021 FINDINGS: CT CHEST FINDINGS Cardiovascular: Mild cardiomegaly. Aorta normal caliber. Right PICC line in place as well as right dialysis catheter with the tips near the cavoatrial junction. Mediastinum/Nodes: No mediastinal, hilar, or axillary adenopathy. Trachea and esophagus are unremarkable. Endotracheal tube within the mid trachea. Bilateral thyroid nodules. In the setting of significant comorbidities or limited life expectancy, no follow-up recommended (ref: J Am Coll Radiol. 2015 Feb;12(2): 143-50). Lungs/Pleura: Dependent atelectasis in the lower lobes bilaterally. Cystic areas within the right lower lobe, unchanged since prior study. No effusions. Musculoskeletal: No acute bony abnormality. CT ABDOMEN PELVIS FINDINGS Hepatobiliary: No focal hepatic abnormality. Gallbladder unremarkable. Pancreas: No focal abnormality or ductal dilatation. Spleen: No focal abnormality.  Normal size. Adrenals/Urinary Tract: No adrenal abnormality. No focal renal abnormality. No stones or hydronephrosis. Foley catheter in the bladder which is decompressed. Stomach/Bowel: Stomach, large and small bowel grossly unremarkable. NG tube in the mid stomach. Normal appendix. Vascular/Lymphatic: Few scattered retroperitoneal lymph nodes, none pathologically enlarged. No aneurysm or adenopathy. Reproductive: No visible focal abnormality. Other: No free fluid or free air. Musculoskeletal: No acute bony abnormality. IMPRESSION: Dependent airspace opacities in the lower lobes most compatible with atelectasis. Air-filled cysts in the right lower lobe are stable since prior study. Cardiomegaly. No acute findings in the abdomen or pelvis. Electronically Signed   By: Rolm Baptise M.D.   On: 02/13/2022  17:44   DG Chest Port 1 View  Result Date: 02/12/2022 CLINICAL DATA:  Respiratory distress. EXAM: PORTABLE CHEST 1 VIEW COMPARISON:  02/11/2022 FINDINGS: The right IJ dialysis catheter is stable. The endotracheal tube is 6 cm  above the carina. The NG tube is coursing down the esophagus and into the stomach. An esophageal temperature probe is noted. Stable mild cardiac enlargement. Interval slight worsening right lung aeration possibly progressive atelectasis or infiltrate. No large pleural effusion. IMPRESSION: 1. Stable support apparatus. 2. Slight worsening right lung aeration possibly progressive atelectasis or infiltrate. Electronically Signed   By: Marijo Sanes M.D.   On: 02/12/2022 15:51   ECHOCARDIOGRAM LIMITED  Result Date: 02/12/2022    ECHOCARDIOGRAM LIMITED REPORT   Patient Name:   JUANMIGUEL DEFELICE [name] Date of Exam: 02/12/2022 Medical Rec #:  [id number]        Height:       72.0 in Accession #:    [id number]       Weight:       327.4 lb Date of Birth:  08-12-1981        BSA:          2.627 m Patient Age:    29 years         BP:           118/76 mmHg Patient Gender: M                HR:           103 bpm. Exam Location:  [facility] Procedure: Limited Echo, Limited Color Doppler and Cardiac Doppler Indications:     I50.21 congestive heart failure-Acute Systolic  History:         Patient has prior history of Echocardiogram examinations, most                  recent 09/05/2021. CKD; Risk Factors:Hypertension. Hx of                  substance abuse.  Sonographer:     Charmayne Sheer Referring Phys:  [id number] SHERI HAMMOCK Diagnosing Phys: Kate Sable MD IMPRESSIONS  1. Left ventricular ejection fraction, by estimation, is 20 to 25%. The left ventricle has severely decreased function. There is moderate left ventricular hypertrophy.  2. Right ventricular systolic function is normal. The right ventricular size is normal.  3. The aortic valve is tricuspid. FINDINGS  Left Ventricle: Left ventricular ejection fraction,  by estimation, is 20 to 25%. The left ventricle has severely decreased function. There is moderate left ventricular hypertrophy. Right Ventricle: The right ventricular size is normal. Right ventricular systolic function is normal. Tricuspid Valve: The tricuspid valve is normal in structure. Tricuspid valve regurgitation is mild. Aortic Valve: The aortic valve is tricuspid. LEFT VENTRICLE PLAX 2D LVIDd:         6.27 cm LVIDs:         5.30 cm LV PW:         1.84 cm LV IVS:        1.67 cm  LV Volumes (MOD) LV vol d, MOD A2C: 175.0 ml LV vol d, MOD A4C: 235.0 ml LV vol s, MOD A2C: 107.0 ml LV vol s, MOD A4C: 148.0 ml LV SV MOD A2C:     68.0 ml LV SV MOD A4C:     235.0 ml LV SV MOD BP:      89.3 ml LEFT ATRIUM         Index LA diam:    4.00 cm 1.52 cm/m Kate Sable MD Electronically signed by Kate Sable MD Signature Date/Time: 02/12/2022/1:00:03 PM    Final    DG Chest Port 1 View  Result Date: 02/11/2022 CLINICAL DATA:  PICC line placement EXAM: PORTABLE CHEST 1 VIEW COMPARISON:  02/10/2022 FINDINGS: Transverse diameter of heart is increased. Central pulmonary vessels are slightly less prominent. There are no signs of alveolar pulmonary edema or new focal infiltrates. Costophrenic angles are clear. There is no pneumothorax. Tip of endotracheal tube is 4.5 cm above the carina. Enteric tube is noted traversing the esophagus. Tip of dialysis catheter is seen in the region of right atrium. There is interval placement of PICC line through the right upper extremity with its distal course in the superior vena cava. IMPRESSION: Tip of PICC line is seen in the course of superior vena cava. Cardiomegaly. There is partial clearing of pulmonary vascular congestion. Electronically Signed   By: Elmer Picker M.D.   On: 02/11/2022 15:10   DG Abd 1 View  Result Date: 02/11/2022 CLINICAL DATA:  Vomiting EXAM: ABDOMEN - 1 VIEW COMPARISON:  02/10/2022 FINDINGS: Tip of enteric tube is seen in the region of body  of stomach. Bowel gas pattern is nonspecific. There is no significant small bowel dilation. There is interval decrease in amount of gas in the stomach. Transverse diameter of heart is increased. IMPRESSION: Tip of enteric tube is seen in the stomach. Nonspecific bowel gas pattern. Electronically Signed   By: Elmer Picker M.D.   On: 02/11/2022 15:07   [place] EKG SITE RITE  Result Date: 02/11/2022 If Site Rite image not attached, placement could not be confirmed due to current cardiac rhythm.  DG Abd 1 View  Result Date: 02/10/2022 CLINICAL DATA:  Orogastric tube placement EXAM: ABDOMEN - 1 VIEW COMPARISON:  None Available. FINDINGS: Limited radiograph of the lower chest and upper abdomen was obtained for the purposes of enteric tube localization. Enteric tube is seen coursing below the diaphragm with distal tip and side port terminating within the expected location of the gastric body. IMPRESSION: Enteric tube with distal tip and side port terminating within the expected location of the gastric body. Electronically Signed   By: Davina Poke D.O.   On: 02/10/2022 19:11   DG Chest Port 1 View  Result Date: 02/10/2022 CLINICAL DATA:  ET tube, OG tube placement EXAM: PORTABLE CHEST 1 VIEW COMPARISON:  02/10/2022 FINDINGS: Endotracheal tube and NG tube remain in place, unchanged. Left central line tip at the confluence of the innominate veins. Right dialysis catheter tips in the right atrium. Cardiomegaly with vascular congestion. Bibasilar atelectasis. No overt edema. IMPRESSION: Cardiomegaly, vascular congestion.  Bibasilar atelectasis. Electronically Signed   By: Rolm Baptise M.D.   On: 02/10/2022 19:10   DG Chest 1 View  Result Date: 02/10/2022 CLINICAL DATA:  Shortness this of breath EXAM: CHEST  1 VIEW COMPARISON:  Portable exam 1059 hours compared to 02/06/2022 FINDINGS: Tip of endotracheal tube projects 3.4 cm above carina. LEFT jugular central venous catheter tip projects over LEFT  brachiocephalic vein. RIGHT jugular central venous catheter tip projects over RIGHT atrium. Enlargement of cardiac silhouette with pulmonary vascular congestion. BILATERAL mild perihilar infiltrates likely pulmonary edema. Minimal RIGHT basilar atelectasis. No pneumothorax. IMPRESSION: Enlargement of cardiac silhouette with pulmonary vascular congestion and probable mild pulmonary edema. RIGHT basilar atelectasis. Electronically Signed   By: Lavonia Dana M.D.   On: 02/10/2022 11:25   PERIPHERAL VASCULAR CATHETERIZATION  Result Date: 02/11/2022 See surgical note for result.  DG Chest Port 1 View  Result Date: 02/06/2022 CLINICAL DATA:  Acute respiratory failure. EXAM: PORTABLE CHEST 1 VIEW COMPARISON:  02/04/2022 FINDINGS: 0743 hours. Low volume film. Asymmetric elevation right hemidiaphragm, as  before. The cardio pericardial silhouette is enlarged. There is pulmonary vascular congestion without overt pulmonary edema. The NG tube passes into the stomach although the distal tip position is not included on the film. Left IJ central line tip is positioned at the innominate vein confluence. Telemetry leads overlie the chest. IMPRESSION: 1. Low volume film with asymmetric elevation right hemidiaphragm. 2. Pulmonary vascular congestion without overt pulmonary edema. Electronically Signed   By: Misty Stanley M.D.   On: 02/06/2022 07:55   DG Chest 1 View  Result Date: 02/04/2022 CLINICAL DATA:  Catheter insertion. EXAM: PORTABLE CHEST 1 VIEW COMPARISON:  Chest XR, 02/03/2022.  CT chest, 07/07/2021. FINDINGS: Support lines: ETT with catheter tip unchanged and within the midthoracic trachea. New LEFT IJ non tunneled dialysis catheter, with tip at the LEFT brachiocephalic vein/SVC junction. Large bore enteric tube with tip excluded from view. Overlying cutaneous leads. Cardiomediastinal silhouette is enlarged and unchanged. Similar pulmonary findings with hypoinflation perihilar interstitial thickening, and LEFT  basilar opacity. No large pleural effusion or pneumothorax. No interval osseous abnormality. IMPRESSION: 1. New LEFT IJ non tunneled dialysis catheter, with tip at the brachiocephalic vein/SVC junction. Additional lines and tubes as above. 2. Unchanged pulmonary findings.  No pneumothorax. Electronically Signed   By: Michaelle Birks M.D.   On: 02/04/2022 08:40   DG Chest Port 1 View  Result Date: 02/03/2022 CLINICAL DATA:  Endotracheal tube. EXAM: PORTABLE CHEST 1 VIEW COMPARISON:  February 02, 2022. FINDINGS: Stable cardiomegaly. Endotracheal and nasogastric tubes are unchanged in position. Stable right lung opacity is noted with probable left retrocardiac opacity is well. Bony thorax is unremarkable. IMPRESSION: Stable support apparatus. Stable bilateral lung opacities are noted as described above. Electronically Signed   By: Marijo Conception M.D.   On: 02/03/2022 08:02   US Abdomen Limited RUQ (LIVER/GB)  Result Date: 02/02/2022 CLINICAL DATA:  Elevated liver enzymes. EXAM: ULTRASOUND ABDOMEN LIMITED RIGHT UPPER QUADRANT COMPARISON:  None Available. FINDINGS: Gallbladder: No gallstones or wall thickening visualized. No sonographic Murphy sign noted by sonographer. Common bile duct: Diameter: 4 mm Liver: There is diffuse increased liver echogenicity most commonly seen in the setting of fatty infiltration. Superimposed inflammation or fibrosis is not excluded. Clinical correlation is recommended. Portal vein is patent on color Doppler imaging with normal direction of blood flow towards the liver. Other: None. IMPRESSION: Fatty liver, otherwise unremarkable right upper quadrant ultrasound. Electronically Signed   By: Anner Crete M.D.   On: 02/02/2022 20:51   DG Chest Port 1 View  Result Date: 02/02/2022 CLINICAL DATA:  Acute respiratory failure with hypoxia. EXAM: PORTABLE CHEST 1 VIEW COMPARISON:  02/01/2022 FINDINGS: Enteric tube courses into the region of the stomach and off the film as tip is not  visualized. Endotracheal tube is unchanged. Lungs are hypoinflated with stable mild hazy opacification in the left base/retrocardiac region. Mild hazy density over the right lung unchanged. Stable cardiomegaly. Remainder of the exam is unchanged. IMPRESSION: 1. Hypoinflation with stable hazy density over the right lung and left base/retrocardiac region. 2. Stable cardiomegaly. 3. Tubes and lines as described. Electronically Signed   By: Marin Olp M.D.   On: 02/02/2022 08:39   DG Chest Port 1 View  Result Date: 02/01/2022 CLINICAL DATA:  Endotracheal tube placement. EXAM: PORTABLE CHEST 1 VIEW COMPARISON:  02/01/2022 FINDINGS: Endotracheal tube has tip 6.6 cm above the carina. Enteric tube courses into the region of the stomach and off the film as tip is not visualized. Lungs are hypoinflated with persistent  slight elevation of the right hemidiaphragm. Mild patchy hazy density over the right lung unchanged which may be due to asymmetric edema or infection. Mild stable hazy density left retrocardiac region. Stable cardiomegaly. Remainder of the exam is unchanged. IMPRESSION: 1. Hypoinflation with stable patchy hazy density over the right lung and left retrocardiac region. Stable cardiomegaly. 2. Tubes and lines as described. Electronically Signed   By: Marin Olp M.D.   On: 02/01/2022 10:48   DG Chest Port 1 View  Result Date: 02/01/2022 CLINICAL DATA:  Cardiac arrest. EXAM: PORTABLE CHEST 1 VIEW COMPARISON:  Portable chest yesterday at 6:38 a.m. FINDINGS: ETT tip is 5.3 cm from the carina, NGT adequately intragastric. The heart silhouette severely enlarged. There is perihilar vascular congestion central vascular prominence and patchy consolidation throughout the right lung fields and left lower lung field consistent with pneumonia, edema or combination and overlying small to moderate-sized pleural effusions. There is moderate interval clearing in the periphery of the right lung compared with  yesterday's exam with no other change in the lung opacities. There is no new or worsening abnormality. In all other respects no further changes. IMPRESSION: 1. Cardiomegaly and persistent central vascular prominence. 2. Patchy consolidation in the right lung fields, left lower lung field with improvement in aeration of the peripheral right lung fields and otherwise unchanged. 3. Bilateral pleural effusions. Electronically Signed   By: Telford Nab M.D.   On: 02/01/2022 07:21   US RENAL  Result Date: 01/31/2022 CLINICAL DATA:  Acute kidney injury. EXAM: RENAL / URINARY TRACT ULTRASOUND COMPLETE COMPARISON:  Renal ultrasound 04/06/2019 FINDINGS: Right Kidney: Renal measurements: 12.2 x 5.2 x 5.1 cm = volume: 167 mL. The renal parenchyma is hyperechoic to the index organ, the liver. No focal lesions are present. No stone or obstruction. Left Kidney: Renal measurements: 11.1 x 4.9 x 5.4 cm = volume: 156 mL. The renal parenchyma is hyperechoic to the index organ, the liver. No focal lesions are present. No stone or obstruction. Bladder: Not visualized Other: None. IMPRESSION: The kidneys are hyperechoic bilaterally without focal lesion or obstruction. This is nonspecific, but can be seen in the setting of medical renal disease. Echogenicity has increased since the prior exam. Electronically Signed   By: San Morelle M.D.   On: 01/31/2022 19:48   DG Chest 1 View  Result Date: 01/31/2022 CLINICAL DATA:  41 year old male status post endotracheal tube placement. EXAM: CHEST  1 VIEW COMPARISON:  Chest x-ray 01/21/2022. FINDINGS: Patient is now intubated, with the tip of the endotracheal tube 6.9 cm above the carina. A nasogastric tube is seen extending into the stomach, however, the tip of the nasogastric tube extends below the lower margin of the image. Transcutaneous defibrillator pad projecting over the mid thorax. Lung volumes remain low. Near complete opacification of the right hemithorax and extensive  opacification in the left mid to lower lung, similar to the prior study, indicative of severe bilateral (right-greater-than-left) multilobar pneumonia. No pneumothorax. Moderate cardiomegaly. The patient is rotated to the right on today's exam, resulting in distortion of the mediastinal contours and reduced diagnostic sensitivity and specificity for mediastinal pathology. IMPRESSION: 1. Support apparatus, as above. 2. Severe multilobar bilateral pneumonia (right-greater-than-left) redemonstrated. Electronically Signed   By: Vinnie Langton M.D.   On: 01/31/2022 06:49   DG Chest 1 View  Result Date: 01/31/2022 CLINICAL DATA:  41 year old male with history of shortness of breath. EXAM: CHEST  1 VIEW COMPARISON:  Chest x-ray 01/27/2022. FINDINGS: Worsening airspace consolidation throughout the  entire right lung. Areas of interstitial prominence and patchy ill-defined opacities are also noted in the left lung, most confluent in the left lung base. No definite pleural effusions. No pneumothorax. Pulmonary vasculature is obscured. Heart size is mildly enlarged. IMPRESSION: 1. Worsening multilobar bilateral pneumonia (right-greater-than-left), as above. Electronically Signed   By: Vinnie Langton M.D.   On: 01/31/2022 05:34   DG Chest Port 1 View  Result Date: 02/02/2022 CLINICAL DATA:  Shortness of breath EXAM: PORTABLE CHEST 1 VIEW COMPARISON:  Chest x-ray dated January 30, 2022 FINDINGS: Unchanged cardiomegaly. Increased mid and lower lung predominant heterogeneous opacities. No definite pleural effusion. No evidence of pneumothorax. IMPRESSION: Increased heterogeneous opacities of the right lung, concerning for worsening infection. Electronically Signed   By: Yetta Glassman M.D.   On: 01/28/2022 11:12   DG Chest Port 1 View  Result Date: 02/04/2022 CLINICAL DATA:  41 year old male with history of shortness of breath, pneumonia and hemoptysis. EXAM: PORTABLE CHEST 1 VIEW COMPARISON:  Chest x-ray 02/03/2022.  FINDINGS: Extensive areas of ill-defined airspace consolidation noted throughout the periphery of the right lung, most evident in the right mid to lower lung. No pleural effusions. No pneumothorax. Cephalization of the pulmonary vasculature. No frank pulmonary edema. Heart size is moderately enlarged. Upper mediastinal contours are unremarkable. IMPRESSION: 1. Severe multilobar pneumonia in the right lung, similar to the recent prior study. 2. Cardiomegaly with pulmonary venous congestion, but no frank pulmonary edema. Electronically Signed   By: Vinnie Langton M.D.   On: 01/20/2022 05:14   DG Chest Portable 1 View  Result Date: 01/16/2022 CLINICAL DATA:  Shortness of breath EXAM: PORTABLE CHEST 1 VIEW COMPARISON:  11/02/2021 FINDINGS: Cardiomegaly. Patchy airspace disease throughout the right lung. No confluent opacity on the left. No effusions or acute bony abnormality. IMPRESSION: Patchy opacities within the right lung concerning for pneumonia. Electronically Signed   By: Rolm Baptise M.D.   On: 01/20/2022 00:56    Microbiology Recent Results (from the past 240 hour(s))  Culture, blood (Routine X 2) w Reflex to ID Panel     Status: None   Collection Time: 02/06/22  8:34 AM   Specimen: BLOOD  Result Value Ref Range Status   Specimen Description BLOOD LEFT WRIST  Final   Special Requests   Final    BOTTLES DRAWN AEROBIC AND ANAEROBIC Blood Culture results may not be optimal due to an inadequate volume of blood received in culture bottles   Culture   Final    NO GROWTH 5 DAYS Performed at [facility], [address], [place], [place] [address]    Report Status 02/11/2022 FINAL  Final  Culture, blood (Routine X 2) w Reflex to ID Panel     Status: None   Collection Time: 02/06/22  3:39 PM   Specimen: BLOOD  Result Value Ref Range Status   Specimen Description BLOOD [facility]  Final   Special Requests BOTTLES DRAWN AEROBIC AND ANAEROBIC BCAV  Final   Culture   Final    NO GROWTH 5  DAYS Performed at [facility], [address], [place], [place] [address]    Report Status 02/11/2022 FINAL  Final  Culture, blood (Routine X 2) w Reflex to ID Panel     Status: None (Preliminary result)   Collection Time: 02/13/22  4:50 PM   Specimen: BLOOD  Result Value Ref Range Status   Specimen Description BLOOD LEFT ASSIST CONTROL  Final   Special Requests   Final  BOTTLES DRAWN AEROBIC AND ANAEROBIC Blood Culture adequate volume   Culture   Final    NO GROWTH < 24 HOURS Performed at [facility], [address] [place], [place] [address]    Report Status PENDING  Incomplete  SARS Coronavirus 2 by RT PCR (hospital order, performed in [facility] hospital lab) *cepheid single result test* Urine, Catheterized     Status: None   Collection Time: 02/13/22  5:54 PM   Specimen: Urine, Catheterized; Nasal Swab  Result Value Ref Range Status   SARS Coronavirus 2 by RT PCR NEGATIVE NEGATIVE Final    Comment: (NOTE) SARS-CoV-2 target nucleic acids are NOT DETECTED.  The SARS-CoV-2 RNA is generally detectable in upper and lower respiratory specimens during the acute phase of infection. The lowest concentration of SARS-CoV-2 viral copies this assay can detect is 250 copies / mL. A negative result does not preclude SARS-CoV-2 infection and should not be used as the sole basis for treatment or other patient management decisions.  A negative result may occur with improper specimen collection / handling, submission of specimen other than nasopharyngeal swab, presence of viral mutation(s) within the areas targeted by this assay, and inadequate number of viral copies (<250 copies / mL). A negative result must be combined with clinical observations, patient history, and epidemiological information.  Fact Sheet for Patients:   https://www.patel.info/  Fact Sheet for Healthcare Providers: https://hall.com/  This test is not  yet approved or  cleared by the [place] FDA and has been authorized for detection and/or diagnosis of SARS-CoV-2 by FDA under an Emergency Use Authorization (EUA).  This EUA will remain in effect (meaning this test can be used) for the duration of the COVID-19 declaration under Section 564(b)(1) of the Act, 21 U.S.C. section 360bbb-3(b)(1), unless the authorization is terminated or revoked sooner.  Performed at [facility] [address] [place], [place] [address]   Culture, blood (Routine X 2) w Reflex to ID Panel     Status: None (Preliminary result)   Collection Time: 02/13/22  6:25 PM   Specimen: BLOOD  Result Value Ref Range Status   Specimen Description BLOOD BOTTLES DRAWN AEROBIC AND ANAEROBIC  Final   Special Requests LEFT ANTECUBITAL Blood Culture adequate volume  Final   Culture   Final    NO GROWTH < 12 HOURS Performed at [facility], [address] [place], [place] [address]    Report Status PENDING  Incomplete    Lab Basic Metabolic Panel: Recent Labs  Lab 02/10/22 0329 02/11/22 0330 02/12/22 0503 02/12/22 0518 02/13/22 0452 02/13/22 1650 01/21/2022 0403 02/06/2022 1550  NA 135 135  --  134* 131* 134* 138 134*  K 4.7 4.8  --  4.3 4.5 5.0 5.6* 5.9*  CL 98 96*  --  95* 92* 92* 96* 93*  CO2 19* 23  --  23 25 25 24 22   GLUCOSE 98 107*  --  91 89 103* 95 94  BUN 136* 98*  --  118* 88* 94* 103* 113*  CREATININE 8.75* 6.36*  --  6.98* 6.46* 7.64* 7.82* 8.73*  CALCIUM 9.1 8.9  --  8.9 8.6* 9.5 9.9 9.3  MG 2.8* 2.8* 2.8*  --  2.7*  --  3.0*  --   PHOS >30.0* 9.3*  --  9.4* 9.8*  --  >30.0*  --    Liver Function Tests: Recent Labs  Lab 02/10/22 0329 02/11/22 0330 02/12/22 0518 02/13/22 0452 02/13/22 1650 01/17/2022 0403 02/13/2022  1550  AST 39  --   --   --  20 24 19   ALT 164*  --   --   --  72* 67* 61*  ALKPHOS 88  --   --   --  81 85 80  BILITOT 0.6  --   --   --  0.7 0.8 1.0  PROT 6.9  --   --   --  7.4 7.5 7.5  ALBUMIN 2.4*   2.5*   < > 2.6* 2.6* 2.7* 2.6* 2.6*   < > = values in this interval not displayed.   No results for input(s): "LIPASE", "AMYLASE" in the last 168 hours. No results for input(s): "AMMONIA" in the last 168 hours. CBC: Recent Labs  Lab 02/12/22 0503 02/13/22 0452 02/13/22 1650 02/03/2022 0403 02/11/2022 1550  WBC 5.6 7.0 6.6 7.5 7.0  NEUTROABS 3.5 4.9 4.3 5.4 5.1  HGB 7.8* 8.1* 7.6* 7.7* 7.4*  HCT 23.9* 23.6* 24.6* 25.1* 23.8*  MCV 66.0* 66.7* 66.3* 66.9* 66.7*  PLT 264 317 322 378 316   Cardiac Enzymes: No results for input(s): "CKTOTAL", "CKMB", "CKMBINDEX", "TROPONINI" in the last 168 hours. Sepsis Labs: Recent Labs  Lab 02/10/22 0329 02/11/22 0330 02/12/22 0503 02/13/22 0452 02/13/22 1650 01/18/2022 0403 01/18/2022 1550  PROCALCITON 1.71 1.20  --   --   --   --   --   WBC 7.9 7.0   < > 7.0 6.6 7.5 7.0   < > = values in this interval not displayed.    Procedures/Operations  ***   Relda Agosto L Rust-Chester 02/04/2022, 10:14 PM

## 2022-02-15 NOTE — Death Summary Note (Incomplete)
DEATH SUMMARY   Patient Details  Name: Gerald [name] MRN: [id number] DOB: [birth year]  Admission/Discharge Information   Admit Date:  February 04, 2022  Date of Death:  02/19/2022  Time of Death:  22:37  Length of Stay: 47  Referring Physician: Patient, [name]   Reason(s) for Hospitalization  Progressive acute hypoxic respiratory failure  secondary to suspected acute on chronic HFrEF exacerbation and Atrial Fibrillation with RVR.  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 Secondary Diagnoses (including complications and co-morbidities):  Active Problems:   Community acquired pneumonia of right lung   Acute on chronic combined systolic and diastolic CHF (congestive heart failure) ([name])   Stage 3a chronic kidney disease (CKD) ([name]) - baseline SCr 1.8-2.0   Cocaine abuse ([name])   Obesity, Class III, BMI 40-49.9 (morbid obesity) ([place])   Acute on chronic congestive heart failure ([name])   Atrial fibrillation with RVR ([name])   Acute respiratory failure with hypoxemia ([name])   Encounter for continuous renal replacement therapy (CRRT) for acute renal failure ([name])   ESRD (end stage renal disease) on dialysis ([name])   NICM (nonischemic cardiomyopathy) [facility])   Brief Hospital Course (including significant findings, care, treatment, and services provided and events leading to death)  Gerald [name] is a 41 y.o. year old male who presented to [facility] ED on Feb 04, 2022 for evaluation of worsening shortness of breath over the last 3 days and productive cough with no associated chest pain.  Patient also reported on arrival he had not taken his medicines for the last 3 days.  In the ED patient was hypoxic initially requiring BiPAP, however unable to tolerate due to vomiting and was placed on high flow oxygen.  Chest x-ray on arrival concerning for vascular congestion, he received IV Lasix and empiric antibiotics.  BNP and troponin both elevated on admission  and IV heparin was started.  Patient also in atrial fibrillation with rapid ventricular response and was started on a Cardizem drip which was then transition to amiodarone infusion.  Work-up started for acute respiratory failure secondary to acute on chronic combined systolic and diastolic heart failure (most recent LVEF 20 to 25% with grade 3 diastolic dysfunction), A-fib RVR, CKD stage IIIa, hypertension and polysubstance abuse.  Pulmonary critical care medicine, cardiology and nephrology all consulted to assist with management.  On 01/31/2022 patient PEA cardiac arrest with 1 round of ACLS medications and emergent intubation with mechanical ventilatory support.  02/01/2022 patient is anuric, dialysis catheter placed with CRRT started.  02/02/2022 acute Transaminitis, amiodarone/atorvastatin and linezolid stopped.  02/03/2022 patient continues in multiorgan failure on CRRT and mechanical ventilatory support. 6/20: Failed SBT due to delirium and increased work of breathing, tachycardia, diaphoresis.  Transitioned from CRRT to intermittent HD (plan for first HD session 6/21) 6/21: Plan for HD this morning, then will attempt SBT.  Precedex as needed for agitation/delirium with wake up assessment 6/22: Pt remains mechanically intubated requiring minimal ventilator settings.  Sedated with precedex and fentanyl gtts agitation/delirium improving. Contacted nephrology requesting HD session today prior to SBT trial.  Low-grade fevers overnight blood and respiratory cultures pending. 6/23: Fevers have slightly improved.  Pt had a period of agitation requiring 1x dose of 2 mg iv versed.  Will repeat WUA in an attempt to perform SBT. 6/24: Persistent issues with agitation, increased issues with arrhythmia, beta-blockers increased.  Phenobarbital protocol for ICU delirium, thiamine started. 6/25: persistent delirium, switched to Propofol, will need  tracheostomy to wean. 6/26 extubated due mucus plugs in ETT, re-intubated without  difficulty +UPPER AIRWAY SWELLING. 6/27 remains intubated on vent. 6/28 near Cardiac arrest after SAT SBT trial, mother with patient consented to DNR status.  2022/02/23 tracheostomy intervention canceled.  Patient transferred to the OR for organ donation with family present.  Patient extubated at 22:12.  Time of death in OR 22: 85.  Pertinent Labs and Studies  Significant Diagnostic Studies US THYROID  Result Date: February 23, 2022 CLINICAL DATA:  Incidental on CT.  Organ donation evaluation. EXAM: THYROID ULTRASOUND TECHNIQUE: Ultrasound examination of the thyroid gland and adjacent soft tissues was performed. COMPARISON:  CT CAP, 02/13/2022 FINDINGS: Parenchymal Echotexture: Moderately heterogenous Isthmus: 0.6 cm Right lobe: 7.0 x 2.0 x 2.0 cm Left lobe: 6.0 x 3.1 x 3.0 cm _________________________________________________________ Estimated total number of nodules >/= 1 cm: 3 Number of spongiform nodules >/=  2 cm not described below (TR1): 0 Number of mixed cystic and solid nodules >/= 1.5 cm not described below (TR2): 0 _________________________________________________________ Nodule # 1: Location: RIGHT; Inferior Maximum size: 2.4 cm; Other 2 dimensions: 1.8 x 2.0 cm Composition: solid/almost completely solid (2) Echogenicity: hyperechoic (1) Shape: not taller-than-wide (0) Margins: ill-defined (0) Echogenic foci: none (0) ACR TI-RADS total points: 3. ACR TI-RADS risk category: TR3 (3 points). ACR TI-RADS recommendations: *Given size (>/= 1.5 - 2.4 cm) and appearance, a follow-up ultrasound in 1 year should be considered based on TI-RADS criteria. _________________________________________________________ Nodule # 4: Location: ISTHMUS; Maximum size: 2.0 cm; Other 2 dimensions: 1.6 x 1.3 cm Composition: solid/almost completely solid (2) Echogenicity: isoechoic (1) Shape: not taller-than-wide (0) Margins: ill-defined (0) Echogenic foci: none (0) ACR TI-RADS total points: 3. ACR TI-RADS risk category: TR3 (3  points). ACR TI-RADS recommendations: *Given size (>/= 1.5 - 2.4 cm) and appearance, a follow-up ultrasound in 1 year should be considered based on TI-RADS criteria. _________________________________________________________ Nodule # 3: Location: LEFT; Inferior Maximum size: 3.7 cm; Other 2 dimensions: 2.6 x 3.3 cm Composition: solid/almost completely solid (2) Echogenicity: hyperechoic (1) Shape: not taller-than-wide (0) Margins: ill-defined (0) Echogenic foci: none (0) ACR TI-RADS total points: 3. ACR TI-RADS risk category: TR3 (3 points). ACR TI-RADS recommendations: **Given size (>/= 2.5 cm) and appearance, fine needle aspiration of this mildly suspicious nodule should be considered based on TI-RADS criteria. _________________________________________________________ Additional subcentimeter nodules labeled #2 and #3 do not meet threshold for follow-up nor biopsy per current criteria. No cervical adenopathy or abnormal fluid collection within the imaged neck IMPRESSION: 1. Enlarged, multinodular thyroid. 2. 3.7 cm LEFT inferior TR-3 thyroid nodule. FNA biopsy of this mildly suspicious nodule should be considered based on TI-RADS criteria. 3. 2.4 cm RIGHT inferior and 2.0 cm isthmus TR-3 thyroid nodules. In normal circumstance, follow-up ultrasound in 1 year would be considered based on TI-RADS criteria. The above is in keeping with the ACR TI-RADS recommendations - J Am Coll Radiol 2017;14:587-595. Electronically Signed   By: Michaelle Birks M.D.   On: 02/23/2022 05:57   CT CHEST ABDOMEN PELVIS WO CONTRAST  Result Date: 02/13/2022 CLINICAL DATA:  organ procurement.  Acute respiratory failure EXAM: CT CHEST, ABDOMEN AND PELVIS WITHOUT CONTRAST TECHNIQUE: Multidetector CT imaging of the chest, abdomen and pelvis was performed following the standard protocol without IV contrast. RADIATION DOSE REDUCTION: This exam was performed according to the departmental dose-optimization program which includes automated exposure  control, adjustment of the mA and/or kV according to patient size and/or use of iterative reconstruction technique. COMPARISON:  07/07/2021 FINDINGS: CT CHEST  FINDINGS Cardiovascular: Mild cardiomegaly. Aorta normal caliber. Right PICC line in place as well as right dialysis catheter with the tips near the cavoatrial junction. Mediastinum/Nodes: No mediastinal, hilar, or axillary adenopathy. Trachea and esophagus are unremarkable. Endotracheal tube within the mid trachea. Bilateral thyroid nodules. In the setting of significant comorbidities or limited life expectancy, no follow-up recommended (ref: J Am Coll Radiol. 2015 Feb;12(2): 143-50). Lungs/Pleura: Dependent atelectasis in the lower lobes bilaterally. Cystic areas within the right lower lobe, unchanged since prior study. No effusions. Musculoskeletal: No acute bony abnormality. CT ABDOMEN PELVIS FINDINGS Hepatobiliary: No focal hepatic abnormality. Gallbladder unremarkable. Pancreas: No focal abnormality or ductal dilatation. Spleen: No focal abnormality.  Normal size. Adrenals/Urinary Tract: No adrenal abnormality. No focal renal abnormality. No stones or hydronephrosis. Foley catheter in the bladder which is decompressed. Stomach/Bowel: Stomach, large and small bowel grossly unremarkable. NG tube in the mid stomach. Normal appendix. Vascular/Lymphatic: Few scattered retroperitoneal lymph nodes, none pathologically enlarged. No aneurysm or adenopathy. Reproductive: No visible focal abnormality. Other: No free fluid or free air. Musculoskeletal: No acute bony abnormality. IMPRESSION: Dependent airspace opacities in the lower lobes most compatible with atelectasis. Air-filled cysts in the right lower lobe are stable since prior study. Cardiomegaly. No acute findings in the abdomen or pelvis. Electronically Signed   By: Rolm Baptise M.D.   On: 02/13/2022 17:44   DG Chest Port 1 View  Result Date: 02/12/2022 CLINICAL DATA:  Respiratory distress. EXAM:  PORTABLE CHEST 1 VIEW COMPARISON:  02/11/2022 FINDINGS: The right IJ dialysis catheter is stable. The endotracheal tube is 6 cm above the carina. The NG tube is coursing down the esophagus and into the stomach. An esophageal temperature probe is noted. Stable mild cardiac enlargement. Interval slight worsening right lung aeration possibly progressive atelectasis or infiltrate. No large pleural effusion. IMPRESSION: 1. Stable support apparatus. 2. Slight worsening right lung aeration possibly progressive atelectasis or infiltrate. Electronically Signed   By: Marijo Sanes M.D.   On: 02/12/2022 15:51   ECHOCARDIOGRAM LIMITED  Result Date: 02/12/2022    ECHOCARDIOGRAM LIMITED REPORT   Patient Name:   Gerald [name] Date of Exam: 02/12/2022 Medical Rec #:  [id number]        Height:       72.0 in Accession #:    [id number]       Weight:       327.4 lb Date of Birth:  [birth year]        BSA:          2.627 m Patient Age:    63 years         BP:           118/76 mmHg Patient Gender: M                HR:           103 bpm. Exam Location:  [facility] Procedure: Limited Echo, Limited Color Doppler and Cardiac Doppler Indications:     I50.21 congestive heart failure-Acute Systolic  History:         Patient has prior history of Echocardiogram examinations, most                  recent 09/05/2021. CKD; Risk Factors:Hypertension. Hx of                  substance abuse.  Sonographer:     Charmayne Sheer Referring Phys:  [id number] SHERI HAMMOCK Diagnosing Phys: Kate Sable MD IMPRESSIONS  1.  Left ventricular ejection fraction, by estimation, is 20 to 25%. The left ventricle has severely decreased function. There is moderate left ventricular hypertrophy.  2. Right ventricular systolic function is normal. The right ventricular size is normal.  3. The aortic valve is tricuspid. FINDINGS  Left Ventricle: Left ventricular ejection fraction, by estimation, is 20 to 25%. The left ventricle has severely decreased function. There is moderate  left ventricular hypertrophy. Right Ventricle: The right ventricular size is normal. Right ventricular systolic function is normal. Tricuspid Valve: The tricuspid valve is normal in structure. Tricuspid valve regurgitation is mild. Aortic Valve: The aortic valve is tricuspid. LEFT VENTRICLE PLAX 2D LVIDd:         6.27 cm LVIDs:         5.30 cm LV PW:         1.84 cm LV IVS:        1.67 cm  LV Volumes (MOD) LV vol d, MOD A2C: 175.0 ml LV vol d, MOD A4C: 235.0 ml LV vol s, MOD A2C: 107.0 ml LV vol s, MOD A4C: 148.0 ml LV SV MOD A2C:     68.0 ml LV SV MOD A4C:     235.0 ml LV SV MOD BP:      89.3 ml LEFT ATRIUM         Index LA diam:    4.00 cm 1.52 cm/m Kate Sable MD Electronically signed by Kate Sable MD Signature Date/Time: 02/12/2022/1:00:03 PM    Final    DG Chest Port 1 View  Result Date: 02/11/2022 CLINICAL DATA:  PICC line placement EXAM: PORTABLE CHEST 1 VIEW COMPARISON:  02/10/2022 FINDINGS: Transverse diameter of heart is increased. Central pulmonary vessels are slightly less prominent. There are no signs of alveolar pulmonary edema or new focal infiltrates. Costophrenic angles are clear. There is no pneumothorax. Tip of endotracheal tube is 4.5 cm above the carina. Enteric tube is noted traversing the esophagus. Tip of dialysis catheter is seen in the region of right atrium. There is interval placement of PICC line through the right upper extremity with its distal course in the superior vena cava. IMPRESSION: Tip of PICC line is seen in the course of superior vena cava. Cardiomegaly. There is partial clearing of pulmonary vascular congestion. Electronically Signed   By: Elmer Picker M.D.   On: 02/11/2022 15:10   DG Abd 1 View  Result Date: 02/11/2022 CLINICAL DATA:  Vomiting EXAM: ABDOMEN - 1 VIEW COMPARISON:  02/10/2022 FINDINGS: Tip of enteric tube is seen in the region of body of stomach. Bowel gas pattern is nonspecific. There is no significant small bowel dilation. There is  interval decrease in amount of gas in the stomach. Transverse diameter of heart is increased. IMPRESSION: Tip of enteric tube is seen in the stomach. Nonspecific bowel gas pattern. Electronically Signed   By: Elmer Picker M.D.   On: 02/11/2022 15:07   [place] EKG SITE RITE  Result Date: 02/11/2022 If Site Rite image not attached, placement could not be confirmed due to current cardiac rhythm.  DG Abd 1 View  Result Date: 02/10/2022 CLINICAL DATA:  Orogastric tube placement EXAM: ABDOMEN - 1 VIEW COMPARISON:  None Available. FINDINGS: Limited radiograph of the lower chest and upper abdomen was obtained for the purposes of enteric tube localization. Enteric tube is seen coursing below the diaphragm with distal tip and side port terminating within the expected location of the gastric body. IMPRESSION: Enteric tube with distal tip and side port terminating within  the expected location of the gastric body. Electronically Signed   By: Davina Poke D.O.   On: 02/10/2022 19:11   DG Chest Port 1 View  Result Date: 02/10/2022 CLINICAL DATA:  ET tube, OG tube placement EXAM: PORTABLE CHEST 1 VIEW COMPARISON:  02/10/2022 FINDINGS: Endotracheal tube and NG tube remain in place, unchanged. Left central line tip at the confluence of the innominate veins. Right dialysis catheter tips in the right atrium. Cardiomegaly with vascular congestion. Bibasilar atelectasis. No overt edema. IMPRESSION: Cardiomegaly, vascular congestion.  Bibasilar atelectasis. Electronically Signed   By: Rolm Baptise M.D.   On: 02/10/2022 19:10   DG Chest 1 View  Result Date: 02/10/2022 CLINICAL DATA:  Shortness this of breath EXAM: CHEST  1 VIEW COMPARISON:  Portable exam 1059 hours compared to 02/06/2022 FINDINGS: Tip of endotracheal tube projects 3.4 cm above carina. LEFT jugular central venous catheter tip projects over LEFT brachiocephalic vein. RIGHT jugular central venous catheter tip projects over RIGHT atrium. Enlargement of  cardiac silhouette with pulmonary vascular congestion. BILATERAL mild perihilar infiltrates likely pulmonary edema. Minimal RIGHT basilar atelectasis. No pneumothorax. IMPRESSION: Enlargement of cardiac silhouette with pulmonary vascular congestion and probable mild pulmonary edema. RIGHT basilar atelectasis. Electronically Signed   By: Lavonia Dana M.D.   On: 02/10/2022 11:25   PERIPHERAL VASCULAR CATHETERIZATION  Result Date: 01/29/2022 See surgical note for result.  DG Chest Port 1 View  Result Date: 02/06/2022 CLINICAL DATA:  Acute respiratory failure. EXAM: PORTABLE CHEST 1 VIEW COMPARISON:  02/04/2022 FINDINGS: 0743 hours. Low volume film. Asymmetric elevation right hemidiaphragm, as before. The cardio pericardial silhouette is enlarged. There is pulmonary vascular congestion without overt pulmonary edema. The NG tube passes into the stomach although the distal tip position is not included on the film. Left IJ central line tip is positioned at the innominate vein confluence. Telemetry leads overlie the chest. IMPRESSION: 1. Low volume film with asymmetric elevation right hemidiaphragm. 2. Pulmonary vascular congestion without overt pulmonary edema. Electronically Signed   By: Misty Stanley M.D.   On: 02/06/2022 07:55   DG Chest 1 View  Result Date: 02/04/2022 CLINICAL DATA:  Catheter insertion. EXAM: PORTABLE CHEST 1 VIEW COMPARISON:  Chest XR, 02/03/2022.  CT chest, 07/07/2021. FINDINGS: Support lines: ETT with catheter tip unchanged and within the midthoracic trachea. New LEFT IJ non tunneled dialysis catheter, with tip at the LEFT brachiocephalic vein/SVC junction. Large bore enteric tube with tip excluded from view. Overlying cutaneous leads. Cardiomediastinal silhouette is enlarged and unchanged. Similar pulmonary findings with hypoinflation perihilar interstitial thickening, and LEFT basilar opacity. No large pleural effusion or pneumothorax. No interval osseous abnormality. IMPRESSION: 1. New  LEFT IJ non tunneled dialysis catheter, with tip at the brachiocephalic vein/SVC junction. Additional lines and tubes as above. 2. Unchanged pulmonary findings.  No pneumothorax. Electronically Signed   By: Michaelle Birks M.D.   On: 02/04/2022 08:40   DG Chest Port 1 View  Result Date: 02/03/2022 CLINICAL DATA:  Endotracheal tube. EXAM: PORTABLE CHEST 1 VIEW COMPARISON:  February 02, 2022. FINDINGS: Stable cardiomegaly. Endotracheal and nasogastric tubes are unchanged in position. Stable right lung opacity is noted with probable left retrocardiac opacity is well. Bony thorax is unremarkable. IMPRESSION: Stable support apparatus. Stable bilateral lung opacities are noted as described above. Electronically Signed   By: Marijo Conception M.D.   On: 02/03/2022 08:02   US Abdomen Limited RUQ (LIVER/GB)  Result Date: 02/02/2022 CLINICAL DATA:  Elevated liver enzymes. EXAM: ULTRASOUND ABDOMEN LIMITED RIGHT UPPER  QUADRANT COMPARISON:  None Available. FINDINGS: Gallbladder: No gallstones or wall thickening visualized. No sonographic Murphy sign noted by sonographer. Common bile duct: Diameter: 4 mm Liver: There is diffuse increased liver echogenicity most commonly seen in the setting of fatty infiltration. Superimposed inflammation or fibrosis is not excluded. Clinical correlation is recommended. Portal vein is patent on color Doppler imaging with normal direction of blood flow towards the liver. Other: None. IMPRESSION: Fatty liver, otherwise unremarkable right upper quadrant ultrasound. Electronically Signed   By: Anner Crete M.D.   On: 02/02/2022 20:51   DG Chest Port 1 View  Result Date: 02/02/2022 CLINICAL DATA:  Acute respiratory failure with hypoxia. EXAM: PORTABLE CHEST 1 VIEW COMPARISON:  02/01/2022 FINDINGS: Enteric tube courses into the region of the stomach and off the film as tip is not visualized. Endotracheal tube is unchanged. Lungs are hypoinflated with stable mild hazy opacification in the left  base/retrocardiac region. Mild hazy density over the right lung unchanged. Stable cardiomegaly. Remainder of the exam is unchanged. IMPRESSION: 1. Hypoinflation with stable hazy density over the right lung and left base/retrocardiac region. 2. Stable cardiomegaly. 3. Tubes and lines as described. Electronically Signed   By: Marin Olp M.D.   On: 02/02/2022 08:39   DG Chest Port 1 View  Result Date: 02/01/2022 CLINICAL DATA:  Endotracheal tube placement. EXAM: PORTABLE CHEST 1 VIEW COMPARISON:  02/01/2022 FINDINGS: Endotracheal tube has tip 6.6 cm above the carina. Enteric tube courses into the region of the stomach and off the film as tip is not visualized. Lungs are hypoinflated with persistent slight elevation of the right hemidiaphragm. Mild patchy hazy density over the right lung unchanged which may be due to asymmetric edema or infection. Mild stable hazy density left retrocardiac region. Stable cardiomegaly. Remainder of the exam is unchanged. IMPRESSION: 1. Hypoinflation with stable patchy hazy density over the right lung and left retrocardiac region. Stable cardiomegaly. 2. Tubes and lines as described. Electronically Signed   By: Marin Olp M.D.   On: 02/01/2022 10:48   DG Chest Port 1 View  Result Date: 02/01/2022 CLINICAL DATA:  Cardiac arrest. EXAM: PORTABLE CHEST 1 VIEW COMPARISON:  Portable chest yesterday at 6:38 a.m. FINDINGS: ETT tip is 5.3 cm from the carina, NGT adequately intragastric. The heart silhouette severely enlarged. There is perihilar vascular congestion central vascular prominence and patchy consolidation throughout the right lung fields and left lower lung field consistent with pneumonia, edema or combination and overlying small to moderate-sized pleural effusions. There is moderate interval clearing in the periphery of the right lung compared with yesterday's exam with no other change in the lung opacities. There is no new or worsening abnormality. In all other respects  no further changes. IMPRESSION: 1. Cardiomegaly and persistent central vascular prominence. 2. Patchy consolidation in the right lung fields, left lower lung field with improvement in aeration of the peripheral right lung fields and otherwise unchanged. 3. Bilateral pleural effusions. Electronically Signed   By: Telford Nab M.D.   On: 02/01/2022 07:21   US RENAL  Result Date: 01/31/2022 CLINICAL DATA:  Acute kidney injury. EXAM: RENAL / URINARY TRACT ULTRASOUND COMPLETE COMPARISON:  Renal ultrasound 04/06/2019 FINDINGS: Right Kidney: Renal measurements: 12.2 x 5.2 x 5.1 cm = volume: 167 mL. The renal parenchyma is hyperechoic to the index organ, the liver. No focal lesions are present. No stone or obstruction. Left Kidney: Renal measurements: 11.1 x 4.9 x 5.4 cm = volume: 156 mL. The renal parenchyma is hyperechoic to the  index organ, the liver. No focal lesions are present. No stone or obstruction. Bladder: Not visualized Other: None. IMPRESSION: The kidneys are hyperechoic bilaterally without focal lesion or obstruction. This is nonspecific, but can be seen in the setting of medical renal disease. Echogenicity has increased since the prior exam. Electronically Signed   By: San Morelle M.D.   On: 01/31/2022 19:48   DG Chest 1 View  Result Date: 01/31/2022 CLINICAL DATA:  41 year old male status post endotracheal tube placement. EXAM: CHEST  1 VIEW COMPARISON:  Chest x-ray 01/21/2022. FINDINGS: Patient is now intubated, with the tip of the endotracheal tube 6.9 cm above the carina. A nasogastric tube is seen extending into the stomach, however, the tip of the nasogastric tube extends below the lower margin of the image. Transcutaneous defibrillator pad projecting over the mid thorax. Lung volumes remain low. Near complete opacification of the right hemithorax and extensive opacification in the left mid to lower lung, similar to the prior study, indicative of severe bilateral  (right-greater-than-left) multilobar pneumonia. No pneumothorax. Moderate cardiomegaly. The patient is rotated to the right on today's exam, resulting in distortion of the mediastinal contours and reduced diagnostic sensitivity and specificity for mediastinal pathology. IMPRESSION: 1. Support apparatus, as above. 2. Severe multilobar bilateral pneumonia (right-greater-than-left) redemonstrated. Electronically Signed   By: Vinnie Langton M.D.   On: 01/31/2022 06:49   DG Chest 1 View  Result Date: 01/31/2022 CLINICAL DATA:  41 year old male with history of shortness of breath. EXAM: CHEST  1 VIEW COMPARISON:  Chest x-ray 01/31/2022. FINDINGS: Worsening airspace consolidation throughout the entire right lung. Areas of interstitial prominence and patchy ill-defined opacities are also noted in the left lung, most confluent in the left lung base. No definite pleural effusions. No pneumothorax. Pulmonary vasculature is obscured. Heart size is mildly enlarged. IMPRESSION: 1. Worsening multilobar bilateral pneumonia (right-greater-than-left), as above. Electronically Signed   By: Vinnie Langton M.D.   On: 01/31/2022 05:34   DG Chest Port 1 View  Result Date: 02/06/2022 CLINICAL DATA:  Shortness of breath EXAM: PORTABLE CHEST 1 VIEW COMPARISON:  Chest x-ray dated January 30, 2022 FINDINGS: Unchanged cardiomegaly. Increased mid and lower lung predominant heterogeneous opacities. No definite pleural effusion. No evidence of pneumothorax. IMPRESSION: Increased heterogeneous opacities of the right lung, concerning for worsening infection. Electronically Signed   By: Yetta Glassman M.D.   On: 01/31/2022 11:12   DG Chest Port 1 View  Result Date: 02/11/2022 CLINICAL DATA:  41 year old male with history of shortness of breath, pneumonia and hemoptysis. EXAM: PORTABLE CHEST 1 VIEW COMPARISON:  Chest x-ray 01/28/2022. FINDINGS: Extensive areas of ill-defined airspace consolidation noted throughout the periphery of the  right lung, most evident in the right mid to lower lung. No pleural effusions. No pneumothorax. Cephalization of the pulmonary vasculature. No frank pulmonary edema. Heart size is moderately enlarged. Upper mediastinal contours are unremarkable. IMPRESSION: 1. Severe multilobar pneumonia in the right lung, similar to the recent prior study. 2. Cardiomegaly with pulmonary venous congestion, but no frank pulmonary edema. Electronically Signed   By: Vinnie Langton M.D.   On: 01/18/2022 05:14   DG Chest Portable 1 View  Result Date: 02/06/2022 CLINICAL DATA:  Shortness of breath EXAM: PORTABLE CHEST 1 VIEW COMPARISON:  11/02/2021 FINDINGS: Cardiomegaly. Patchy airspace disease throughout the right lung. No confluent opacity on the left. No effusions or acute bony abnormality. IMPRESSION: Patchy opacities within the right lung concerning for pneumonia. Electronically Signed   By: Rolm Baptise M.D.  On: 02/06/2022 00:56    Microbiology Recent Results (from the past 240 hour(s))  Culture, blood (Routine X 2) w Reflex to ID Panel     Status: None   Collection Time: 02/06/22  8:34 AM   Specimen: BLOOD  Result Value Ref Range Status   Specimen Description BLOOD LEFT WRIST  Final   Special Requests   Final    BOTTLES DRAWN AEROBIC AND ANAEROBIC Blood Culture results may not be optimal due to an inadequate volume of blood received in culture bottles   Culture   Final    NO GROWTH 5 DAYS Performed at [facility], [address] [place], [place] [address]    Report Status 02/11/2022 FINAL  Final  Culture, blood (Routine X 2) w Reflex to ID Panel     Status: None   Collection Time: 02/06/22  3:39 PM   Specimen: BLOOD  Result Value Ref Range Status   Specimen Description BLOOD [facility]  Final   Special Requests BOTTLES DRAWN AEROBIC AND ANAEROBIC BCAV  Final   Culture   Final    NO GROWTH 5 DAYS Performed at [facility], [address], [place], [place] [address]    Report Status  02/11/2022 FINAL  Final  Culture, blood (Routine X 2) w Reflex to ID Panel     Status: None (Preliminary result)   Collection Time: 02/13/22  4:50 PM   Specimen: BLOOD  Result Value Ref Range Status   Specimen Description BLOOD LEFT ASSIST CONTROL  Final   Special Requests   Final    BOTTLES DRAWN AEROBIC AND ANAEROBIC Blood Culture adequate volume   Culture   Final    NO GROWTH < 24 HOURS Performed at [facility], [address], [place], [place] [address]    Report Status PENDING  Incomplete  SARS Coronavirus 2 by RT PCR (hospital order, performed in [facility] hospital lab) *cepheid single result test* Urine, Catheterized     Status: None   Collection Time: 02/13/22  5:54 PM   Specimen: Urine, Catheterized; Nasal Swab  Result Value Ref Range Status   SARS Coronavirus 2 by RT PCR NEGATIVE NEGATIVE Final    Comment: (NOTE) SARS-CoV-2 target nucleic acids are NOT DETECTED.  The SARS-CoV-2 RNA is generally detectable in upper and lower respiratory specimens during the acute phase of infection. The lowest concentration of SARS-CoV-2 viral copies this assay can detect is 250 copies / mL. A negative result does not preclude SARS-CoV-2 infection and should not be used as the sole basis for treatment or other patient management decisions.  A negative result may occur with improper specimen collection / handling, submission of specimen other than nasopharyngeal swab, presence of viral mutation(s) within the areas targeted by this assay, and inadequate number of viral copies (<250 copies / mL). A negative result must be combined with clinical observations, patient history, and epidemiological information.  Fact Sheet for Patients:   https://www.patel.info/  Fact Sheet for Healthcare Providers: https://hall.com/  This test is not yet approved or  cleared by the [place] FDA and has been authorized for detection and/or diagnosis  of SARS-CoV-2 by FDA under an Emergency Use Authorization (EUA).  This EUA will remain in effect (meaning this test can be used) for the duration of the COVID-19 declaration under Section 564(b)(1) of the Act, 21 U.S.C. section 360bbb-3(b)(1), unless the authorization is terminated or revoked sooner.  Performed at [facility], [address] [place], [place] [address]  Culture, blood (Routine X 2) w Reflex to ID Panel     Status: None (Preliminary result)   Collection Time: 02/13/22  6:25 PM   Specimen: BLOOD  Result Value Ref Range Status   Specimen Description BLOOD BOTTLES DRAWN AEROBIC AND ANAEROBIC  Final   Special Requests LEFT ANTECUBITAL Blood Culture adequate volume  Final   Culture   Final    NO GROWTH < 12 HOURS Performed at [facility], [address] [place], [place] [address]    Report Status PENDING  Incomplete    Lab Basic Metabolic Panel: Recent Labs  Lab 02/10/22 0329 02/11/22 0330 02/12/22 0503 02/12/22 0518 02/13/22 0452 02/13/22 1650 01/31/2022 0403 02/02/2022 1550  NA 135 135  --  134* 131* 134* 138 134*  K 4.7 4.8  --  4.3 4.5 5.0 5.6* 5.9*  CL 98 96*  --  95* 92* 92* 96* 93*  CO2 19* 23  --  23 25 25 24 22   GLUCOSE 98 107*  --  91 89 103* 95 94  BUN 136* 98*  --  118* 88* 94* 103* 113*  CREATININE 8.75* 6.36*  --  6.98* 6.46* 7.64* 7.82* 8.73*  CALCIUM 9.1 8.9  --  8.9 8.6* 9.5 9.9 9.3  MG 2.8* 2.8* 2.8*  --  2.7*  --  3.0*  --   PHOS >30.0* 9.3*  --  9.4* 9.8*  --  >30.0*  --    Liver Function Tests: Recent Labs  Lab 02/10/22 0329 02/11/22 0330 02/12/22 0518 02/13/22 0452 02/13/22 1650 02/02/2022 0403 01/25/2022 1550  AST 39  --   --   --  20 24 19   ALT 164*  --   --   --  72* 67* 61*  ALKPHOS 88  --   --   --  81 85 80  BILITOT 0.6  --   --   --  0.7 0.8 1.0  PROT 6.9  --   --   --  7.4 7.5 7.5  ALBUMIN 2.4*  2.5*   < > 2.6* 2.6* 2.7* 2.6* 2.6*   < > = values in this interval not displayed.   No results for  input(s): "LIPASE", "AMYLASE" in the last 168 hours. No results for input(s): "AMMONIA" in the last 168 hours. CBC: Recent Labs  Lab 02/12/22 0503 02/13/22 0452 02/13/22 1650 01/28/2022 0403 01/28/2022 1550  WBC 5.6 7.0 6.6 7.5 7.0  NEUTROABS 3.5 4.9 4.3 5.4 5.1  HGB 7.8* 8.1* 7.6* 7.7* 7.4*  HCT 23.9* 23.6* 24.6* 25.1* 23.8*  MCV 66.0* 66.7* 66.3* 66.9* 66.7*  PLT 264 317 322 378 316   Cardiac Enzymes: No results for input(s): "CKTOTAL", "CKMB", "CKMBINDEX", "TROPONINI" in the last 168 hours. Sepsis Labs: Recent Labs  Lab 02/10/22 0329 02/11/22 0330 02/12/22 0503 02/13/22 0452 02/13/22 1650 02/01/2022 0403 01/21/2022 1550  PROCALCITON 1.71 1.20  --   --   --   --   --   WBC 7.9 7.0   < > 7.0 6.6 7.5 7.0   < > = values in this interval not displayed.    Procedures/Operations  01/31/2022: ETT placement 01/31/2022: CVC placement 01/31/2022: Arterial line placement 02/04/2022: CVC placement 02/10/2022: ETT re-placement 01/23/2022 arterial line placement   Toribio Harbour L Rust-Chester 02/04/2022, 10:14 PM  Domingo Pulse [place], AGACNP-BC Acute Care Nurse Practitioner [facility]   [phone] / [phone] Please see Amion for pager details.

## 2022-02-15 DEATH — deceased

## 2022-02-18 LAB — CULTURE, BLOOD (ROUTINE X 2)
Culture: NO GROWTH
Culture: NO GROWTH
Special Requests: ADEQUATE
Special Requests: ADEQUATE

## 2022-02-19 ENCOUNTER — Ambulatory Visit: Payer: Self-pay | Admitting: Family

## 2022-02-19 LAB — SURGICAL PATHOLOGY

## 2022-02-20 LAB — BLOOD GAS, ARTERIAL
Acid-base deficit: 2.1 mmol/L — ABNORMAL HIGH (ref 0.0–2.0)
Bicarbonate: 23.7 mmol/L (ref 20.0–28.0)
FIO2: 70 %
MECHVT: 550 mL
Mechanical Rate: 20
O2 Saturation: 99.6 %
PEEP: 10 cmH2O
Patient temperature: 37
pCO2 arterial: 45 mmHg (ref 32–48)
pH, Arterial: 7.33 — ABNORMAL LOW (ref 7.35–7.45)
pO2, Arterial: 192 mmHg — ABNORMAL HIGH (ref 83–108)

## 2022-03-18 NOTE — OR Nursing (Signed)
All drains and lines removed from pt body. AC to transfer to morgue and contact family [id number]

## 2022-03-18 NOTE — OR Nursing (Signed)
Blood products for organ procurement give to Reatha Armour, [facility]. This blood was released by the lab at [facility] at on February 14, 2022 at 2037. 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 All 5 units Prbc'S, confirmed O+, with This Probation officer and Delmar Landau, RN units are as [phone], [id number], [id number], [id number]

## 2022-03-18 DEATH — deceased

## 2022-08-25 ENCOUNTER — Other Ambulatory Visit: Payer: Self-pay

## 2022-10-11 IMAGING — DX DG CHEST 1V
2 series · 2 of 2 positions shown · non-contrast
Comparison: Chest XR, 02/03/2022.  CT chest, 07/07/2021.

CLINICAL DATA: Catheter insertion.

EXAM:
PORTABLE CHEST 1 VIEW

[chest ap (1 of 2)]
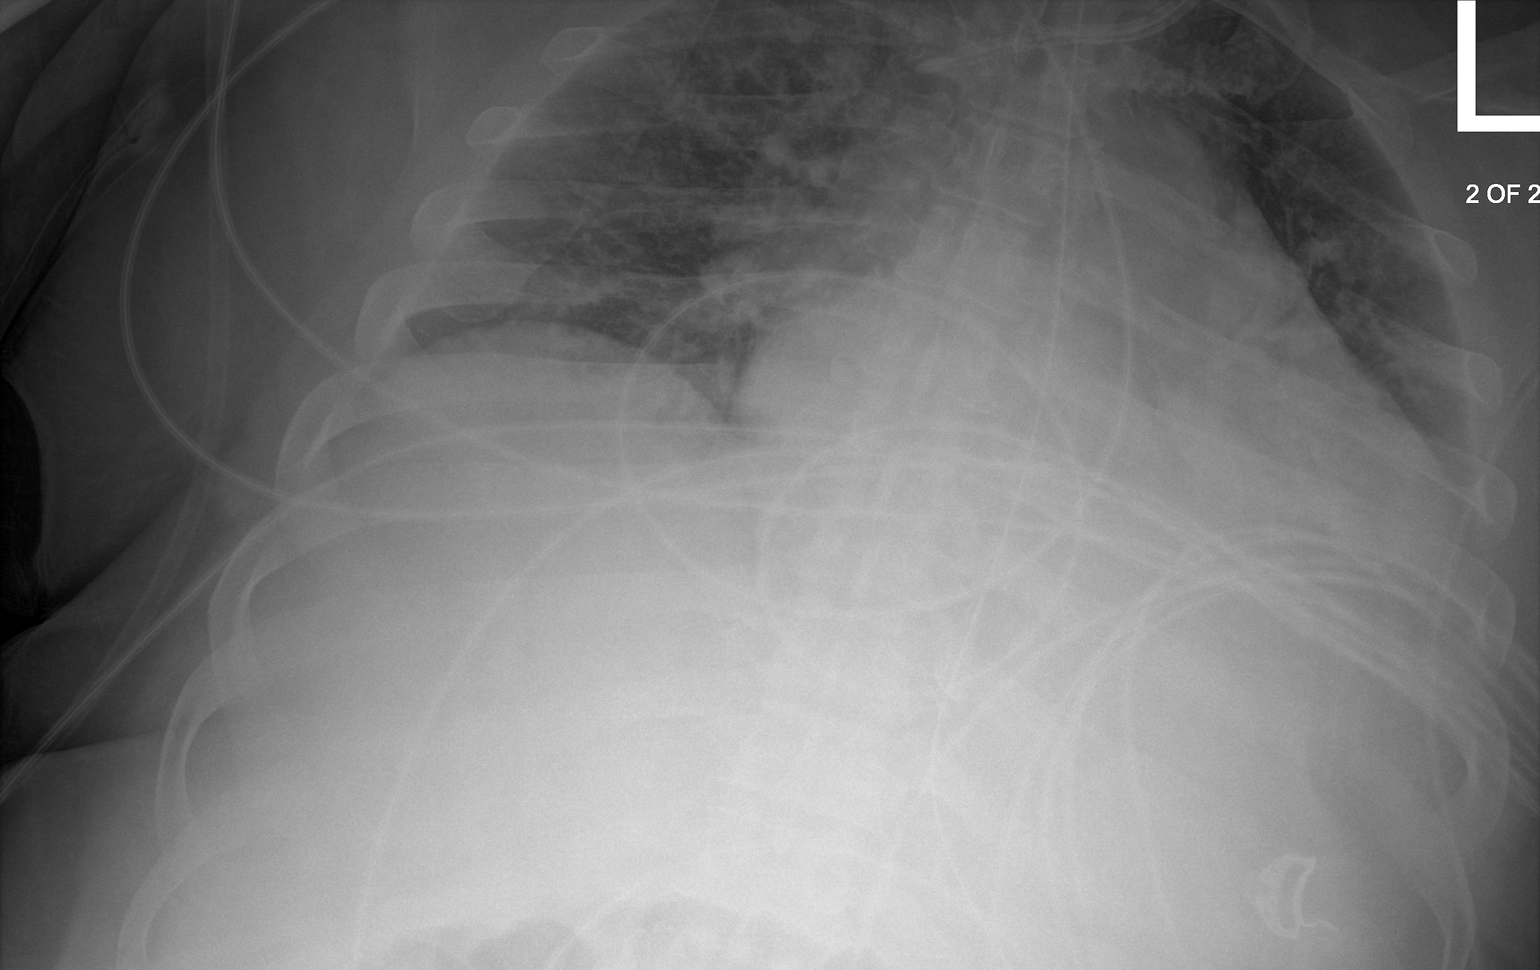

[chest ap (2 of 2)]
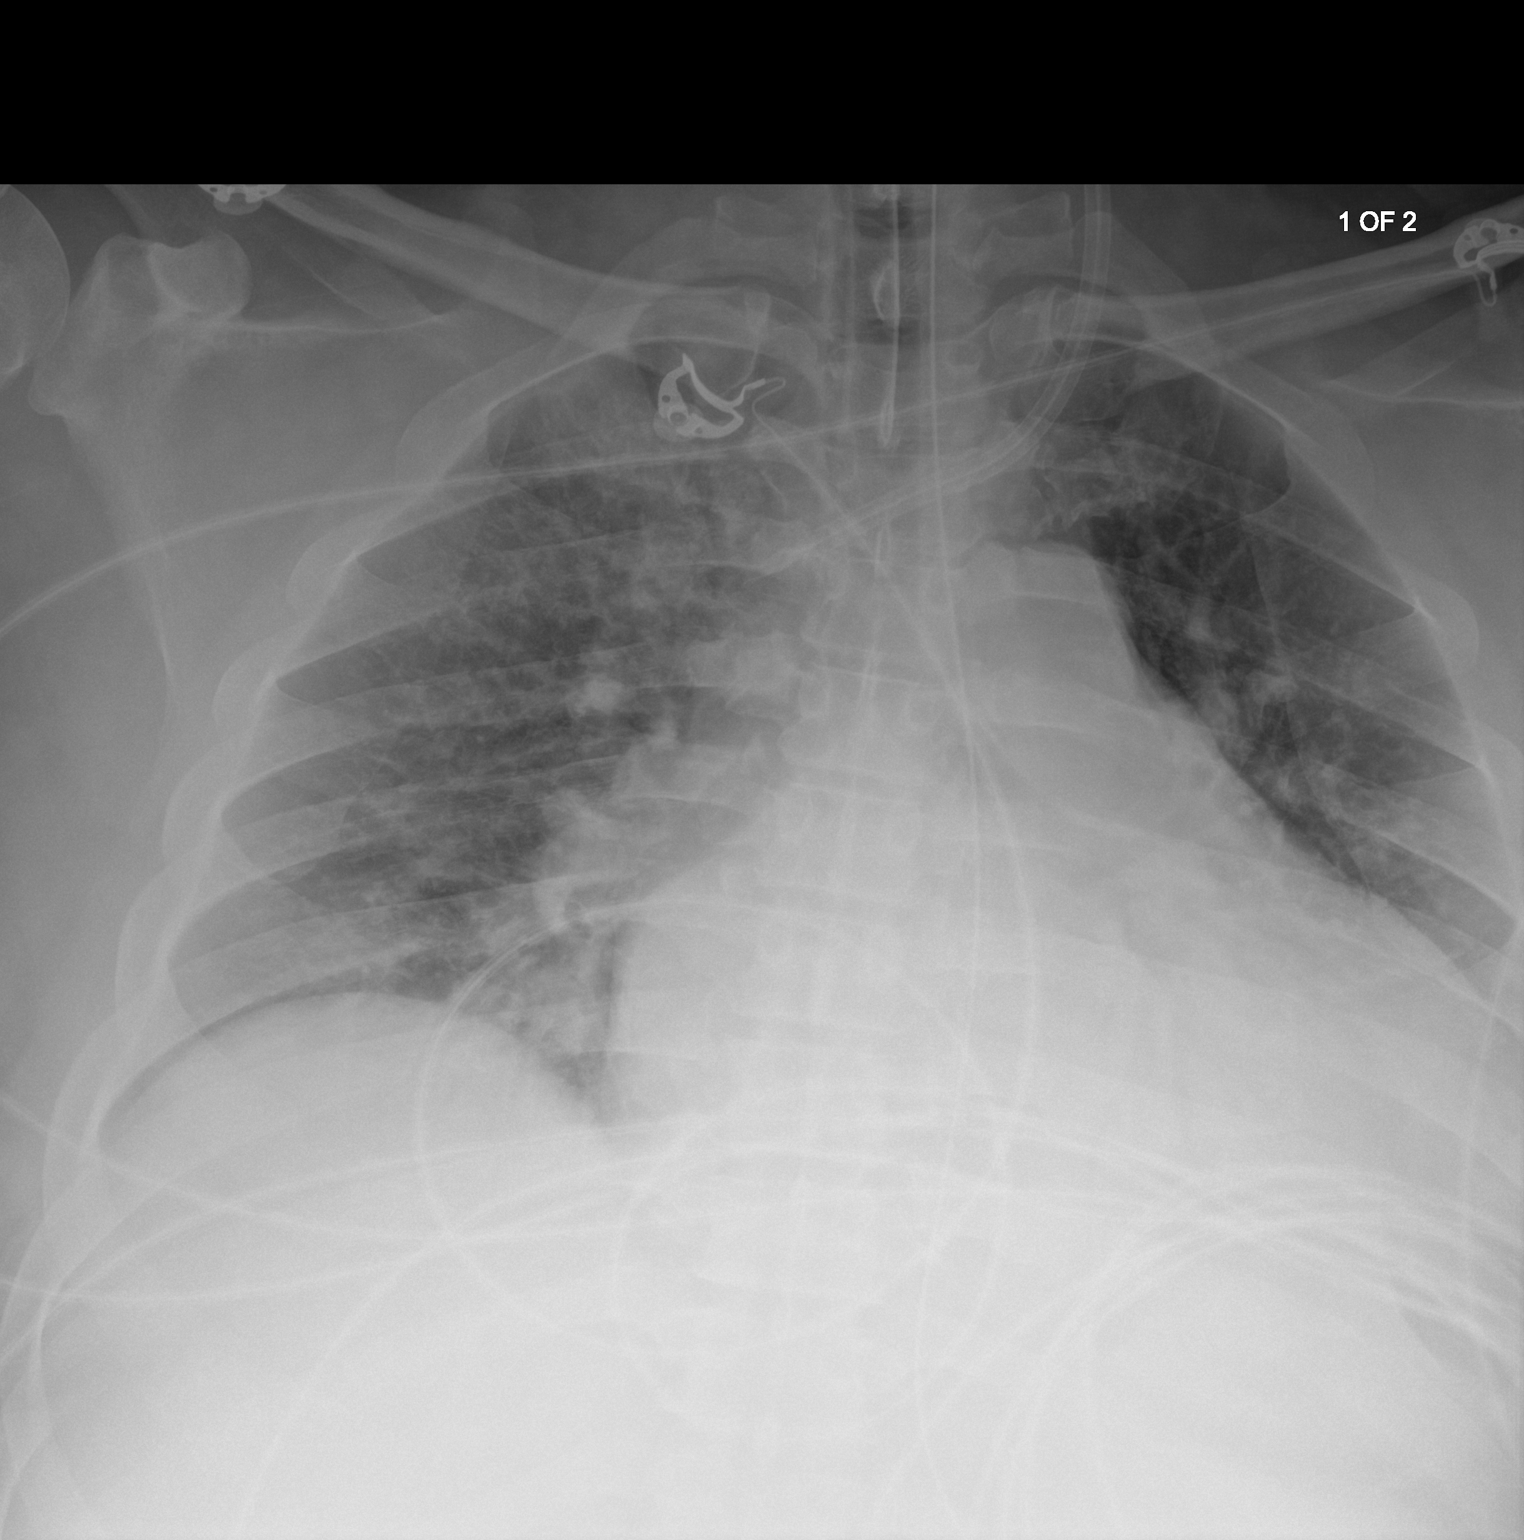

[2 of 2 positions shown; findings below may reference images not displayed]

FINDINGS: Support lines: ETT with catheter tip unchanged and within the
midthoracic trachea. New LEFT IJ non tunneled dialysis catheter,
with tip at the LEFT brachiocephalic vein/SVC junction. Large bore
enteric tube with tip excluded from view. Overlying cutaneous leads.

Cardiomediastinal silhouette is enlarged and unchanged. Similar
pulmonary findings with hypoinflation perihilar interstitial
thickening, and LEFT basilar opacity. No large pleural effusion or
pneumothorax. No interval osseous abnormality.
IMPRESSION: 1. New LEFT IJ non tunneled dialysis catheter, with tip at the
brachiocephalic vein/SVC junction.
Additional lines and tubes as above.
2. Unchanged pulmonary findings.  No pneumothorax.

## 2022-10-13 IMAGING — DX DG CHEST 1V PORT
1 series · 1 of 1 positions shown · non-contrast
Comparison: 02/04/2022

CLINICAL DATA: Acute respiratory failure.

EXAM:
PORTABLE CHEST 1 VIEW

[chest ap]
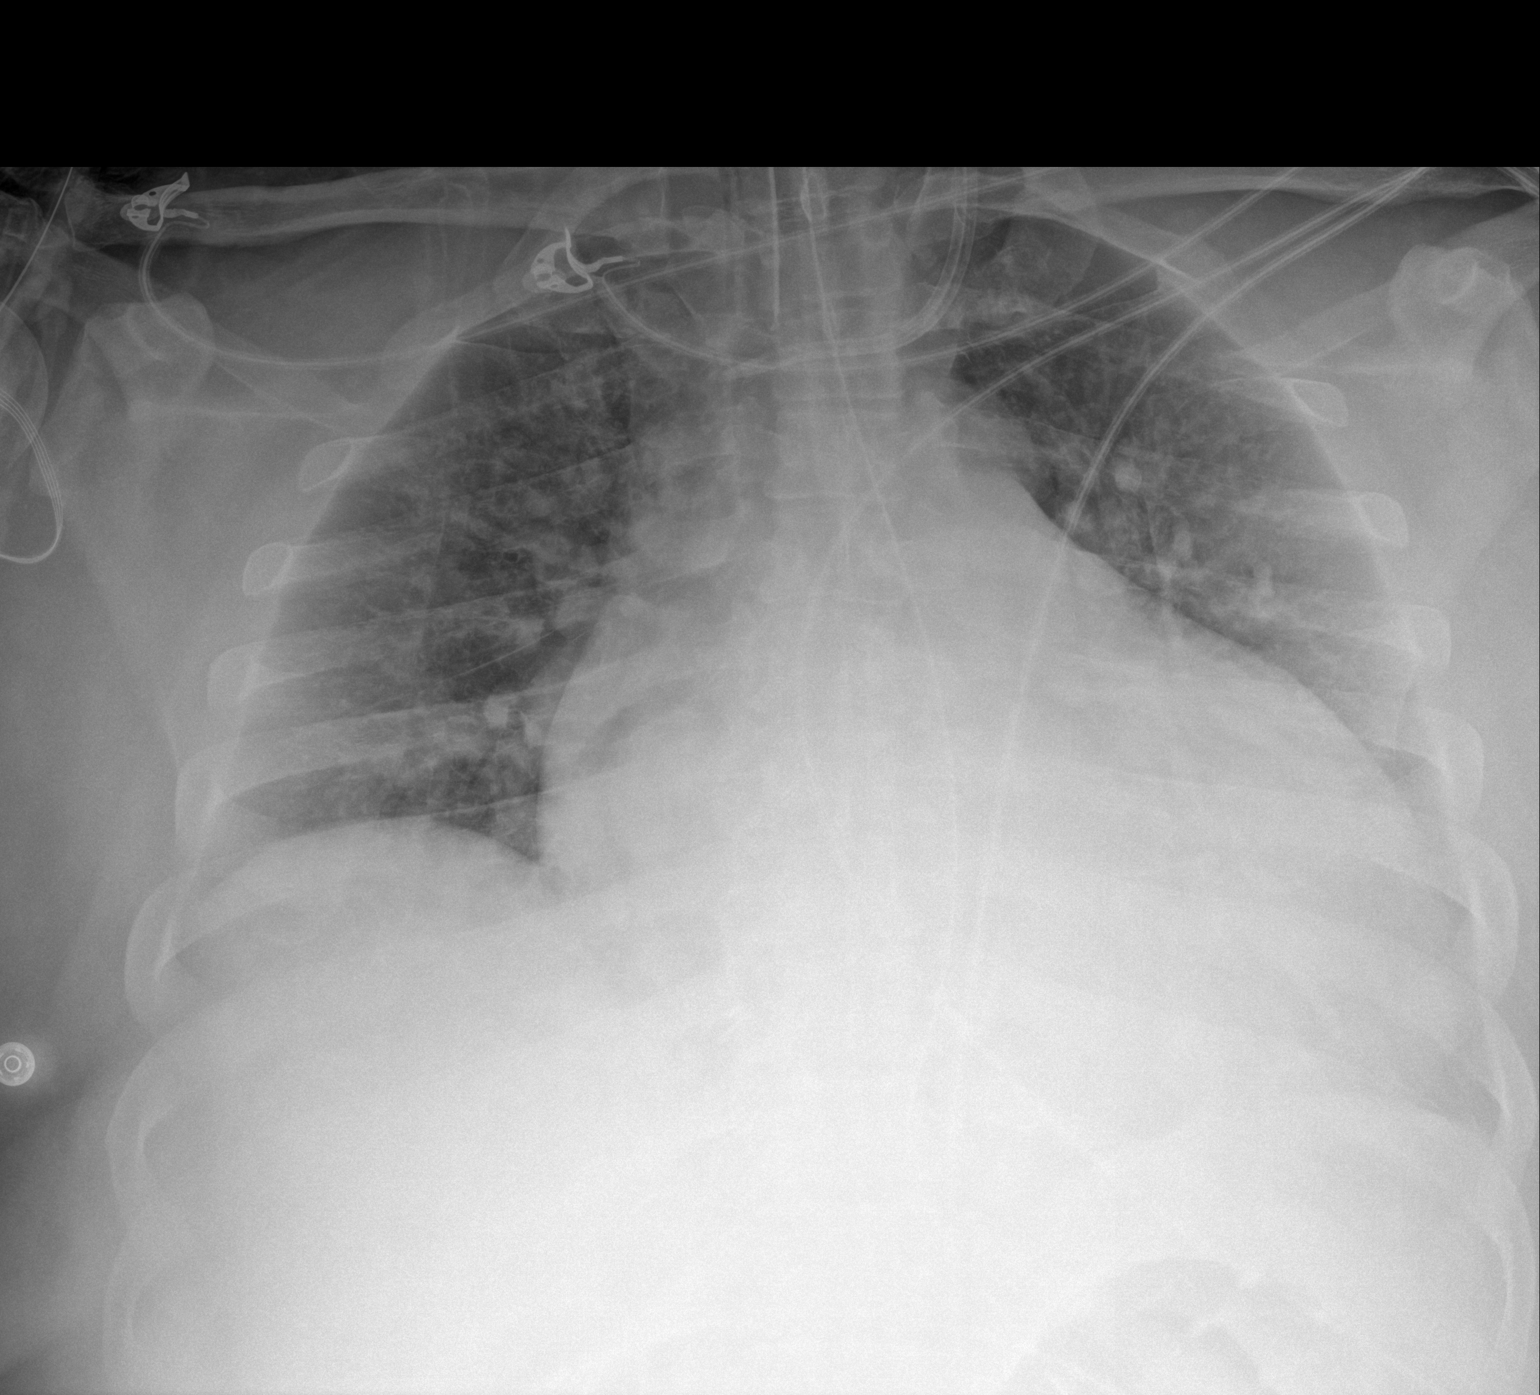

[1 of 1 positions shown; findings below may reference images not displayed]

FINDINGS: 8312 hours. Low volume film. Asymmetric elevation right
hemidiaphragm, as before. The cardio pericardial silhouette is
enlarged. There is pulmonary vascular congestion without overt
pulmonary edema. The NG tube passes into the stomach although the
distal tip position is not included on the film. Left IJ central
line tip is positioned at the innominate vein confluence. Telemetry
leads overlie the chest.
IMPRESSION: 1. Low volume film with asymmetric elevation right hemidiaphragm.
2. Pulmonary vascular congestion without overt pulmonary edema.
